# Patient Record
Sex: Male | Born: 1950
Health system: Southern US, Community
[De-identification: ages and names within clinical notes are randomized; demographics above are authoritative.]

## PROBLEM LIST (undated history)

## (undated) DIAGNOSIS — Z91199 Patient's noncompliance with other medical treatment and regimen due to unspecified reason: Secondary | ICD-10-CM

## (undated) DIAGNOSIS — I4891 Unspecified atrial fibrillation: Secondary | ICD-10-CM

## (undated) DIAGNOSIS — F1010 Alcohol abuse, uncomplicated: Secondary | ICD-10-CM

## (undated) DIAGNOSIS — R569 Unspecified convulsions: Secondary | ICD-10-CM

## (undated) DIAGNOSIS — I428 Other cardiomyopathies: Secondary | ICD-10-CM

## (undated) DIAGNOSIS — I2510 Atherosclerotic heart disease of native coronary artery without angina pectoris: Secondary | ICD-10-CM

## (undated) DIAGNOSIS — F17210 Nicotine dependence, cigarettes, uncomplicated: Secondary | ICD-10-CM

## (undated) DIAGNOSIS — I10 Essential (primary) hypertension: Secondary | ICD-10-CM

## (undated) DIAGNOSIS — Z9119 Patient's noncompliance with other medical treatment and regimen: Secondary | ICD-10-CM

## (undated) DIAGNOSIS — J449 Chronic obstructive pulmonary disease, unspecified: Secondary | ICD-10-CM

## (undated) HISTORY — DX: Atherosclerotic heart disease of native coronary artery without angina pectoris: I25.10

## (undated) HISTORY — DX: Patient's noncompliance with other medical treatment and regimen: Z91.19

## (undated) HISTORY — DX: Patient's noncompliance with other medical treatment and regimen due to unspecified reason: Z91.199

## (undated) HISTORY — DX: Unspecified convulsions: R56.9

## (undated) HISTORY — PX: OTHER SURGICAL HISTORY: SHX169

## (undated) HISTORY — PX: NO PAST SURGERIES: SHX2092

## (undated) HISTORY — DX: Essential (primary) hypertension: I10

## (undated) HISTORY — DX: Chronic obstructive pulmonary disease, unspecified: J44.9

## (undated) HISTORY — DX: Unspecified atrial fibrillation: I48.91

## (undated) HISTORY — DX: Alcohol abuse, uncomplicated: F10.10

## (undated) HISTORY — DX: Other cardiomyopathies: I42.8

---

## 2003-03-18 ENCOUNTER — Emergency Department (HOSPITAL_COMMUNITY): Admission: EM | Admit: 2003-03-18 | Discharge: 2003-03-18 | Payer: Self-pay | Admitting: Emergency Medicine

## 2003-03-20 ENCOUNTER — Emergency Department (HOSPITAL_COMMUNITY): Admission: EM | Admit: 2003-03-20 | Discharge: 2003-03-20 | Payer: Self-pay | Admitting: Emergency Medicine

## 2003-08-30 ENCOUNTER — Emergency Department (HOSPITAL_COMMUNITY): Admission: EM | Admit: 2003-08-30 | Discharge: 2003-08-30 | Payer: Self-pay | Admitting: Emergency Medicine

## 2003-09-02 ENCOUNTER — Emergency Department (HOSPITAL_COMMUNITY): Admission: EM | Admit: 2003-09-02 | Discharge: 2003-09-02 | Payer: Self-pay | Admitting: *Deleted

## 2003-09-13 ENCOUNTER — Ambulatory Visit (HOSPITAL_COMMUNITY): Admission: RE | Admit: 2003-09-13 | Discharge: 2003-09-14 | Payer: Self-pay | Admitting: Orthopedic Surgery

## 2003-12-10 ENCOUNTER — Ambulatory Visit (HOSPITAL_COMMUNITY): Admission: RE | Admit: 2003-12-10 | Discharge: 2003-12-10 | Payer: Self-pay | Admitting: Orthopedic Surgery

## 2003-12-15 ENCOUNTER — Encounter (INDEPENDENT_AMBULATORY_CARE_PROVIDER_SITE_OTHER): Payer: Self-pay | Admitting: Cardiology

## 2003-12-15 ENCOUNTER — Inpatient Hospital Stay (HOSPITAL_COMMUNITY): Admission: RE | Admit: 2003-12-15 | Discharge: 2003-12-23 | Payer: Self-pay | Admitting: Orthopedic Surgery

## 2003-12-18 ENCOUNTER — Other Ambulatory Visit (HOSPITAL_COMMUNITY): Payer: Self-pay

## 2003-12-18 ENCOUNTER — Encounter: Payer: Self-pay | Admitting: Cardiology

## 2003-12-18 ENCOUNTER — Other Ambulatory Visit: Payer: Self-pay

## 2003-12-25 ENCOUNTER — Ambulatory Visit: Payer: Self-pay | Admitting: *Deleted

## 2003-12-25 ENCOUNTER — Ambulatory Visit: Payer: Self-pay | Admitting: Internal Medicine

## 2003-12-29 ENCOUNTER — Emergency Department (HOSPITAL_COMMUNITY): Admission: EM | Admit: 2003-12-29 | Discharge: 2003-12-29 | Payer: Self-pay | Admitting: *Deleted

## 2004-01-03 ENCOUNTER — Ambulatory Visit: Payer: Self-pay | Admitting: Internal Medicine

## 2004-01-10 ENCOUNTER — Emergency Department (HOSPITAL_COMMUNITY): Admission: EM | Admit: 2004-01-10 | Discharge: 2004-01-10 | Payer: Self-pay | Admitting: Emergency Medicine

## 2004-02-11 HISTORY — PX: CARDIAC CATHETERIZATION: SHX172

## 2004-12-11 HISTORY — PX: CARDIAC CATHETERIZATION: SHX172

## 2004-12-12 ENCOUNTER — Inpatient Hospital Stay (HOSPITAL_COMMUNITY): Admission: EM | Admit: 2004-12-12 | Discharge: 2004-12-24 | Payer: Self-pay | Admitting: Emergency Medicine

## 2004-12-13 ENCOUNTER — Ambulatory Visit: Payer: Self-pay | Admitting: Cardiology

## 2004-12-13 ENCOUNTER — Encounter: Payer: Self-pay | Admitting: Cardiology

## 2004-12-15 DIAGNOSIS — I2510 Atherosclerotic heart disease of native coronary artery without angina pectoris: Secondary | ICD-10-CM

## 2004-12-15 HISTORY — DX: Atherosclerotic heart disease of native coronary artery without angina pectoris: I25.10

## 2004-12-19 ENCOUNTER — Ambulatory Visit: Payer: Self-pay | Admitting: Infectious Diseases

## 2005-01-06 ENCOUNTER — Ambulatory Visit: Payer: Self-pay | Admitting: Internal Medicine

## 2005-01-07 ENCOUNTER — Ambulatory Visit: Payer: Self-pay | Admitting: Internal Medicine

## 2005-01-09 ENCOUNTER — Ambulatory Visit: Payer: Self-pay | Admitting: Internal Medicine

## 2005-01-20 ENCOUNTER — Ambulatory Visit: Payer: Self-pay | Admitting: Internal Medicine

## 2005-02-06 ENCOUNTER — Ambulatory Visit: Payer: Self-pay | Admitting: Internal Medicine

## 2005-02-17 ENCOUNTER — Emergency Department (HOSPITAL_COMMUNITY): Admission: EM | Admit: 2005-02-17 | Discharge: 2005-02-18 | Payer: Self-pay | Admitting: Emergency Medicine

## 2005-02-28 ENCOUNTER — Ambulatory Visit: Payer: Self-pay | Admitting: Internal Medicine

## 2005-03-24 ENCOUNTER — Inpatient Hospital Stay (HOSPITAL_COMMUNITY): Admission: EM | Admit: 2005-03-24 | Discharge: 2005-03-26 | Payer: Self-pay | Admitting: Emergency Medicine

## 2005-03-24 ENCOUNTER — Ambulatory Visit: Payer: Self-pay | Admitting: Internal Medicine

## 2005-03-25 ENCOUNTER — Encounter (INDEPENDENT_AMBULATORY_CARE_PROVIDER_SITE_OTHER): Payer: Self-pay | Admitting: Cardiology

## 2005-05-23 ENCOUNTER — Emergency Department (HOSPITAL_COMMUNITY): Admission: EM | Admit: 2005-05-23 | Discharge: 2005-05-23 | Payer: Self-pay | Admitting: Emergency Medicine

## 2005-06-09 ENCOUNTER — Ambulatory Visit: Payer: Self-pay | Admitting: Internal Medicine

## 2005-10-29 ENCOUNTER — Emergency Department (HOSPITAL_COMMUNITY): Admission: EM | Admit: 2005-10-29 | Discharge: 2005-10-29 | Payer: Self-pay | Admitting: Emergency Medicine

## 2005-12-02 ENCOUNTER — Emergency Department (HOSPITAL_COMMUNITY): Admission: EM | Admit: 2005-12-02 | Discharge: 2005-12-02 | Payer: Self-pay | Admitting: Family Medicine

## 2006-09-14 ENCOUNTER — Ambulatory Visit: Payer: Self-pay | Admitting: Hospitalist

## 2006-09-14 ENCOUNTER — Observation Stay (HOSPITAL_COMMUNITY): Admission: EM | Admit: 2006-09-14 | Discharge: 2006-09-15 | Payer: Self-pay | Admitting: Emergency Medicine

## 2006-09-15 ENCOUNTER — Encounter (INDEPENDENT_AMBULATORY_CARE_PROVIDER_SITE_OTHER): Payer: Self-pay | Admitting: Hospitalist

## 2007-01-04 ENCOUNTER — Ambulatory Visit: Payer: Self-pay | Admitting: Internal Medicine

## 2007-01-04 ENCOUNTER — Ambulatory Visit (HOSPITAL_COMMUNITY): Admission: RE | Admit: 2007-01-04 | Discharge: 2007-01-04 | Payer: Self-pay | Admitting: Family Medicine

## 2008-05-18 ENCOUNTER — Emergency Department (HOSPITAL_COMMUNITY): Admission: EM | Admit: 2008-05-18 | Discharge: 2008-05-18 | Payer: Self-pay | Admitting: Family Medicine

## 2008-06-03 ENCOUNTER — Emergency Department (HOSPITAL_COMMUNITY): Admission: EM | Admit: 2008-06-03 | Discharge: 2008-06-03 | Payer: Self-pay | Admitting: Emergency Medicine

## 2009-05-09 ENCOUNTER — Emergency Department (HOSPITAL_COMMUNITY): Admission: EM | Admit: 2009-05-09 | Discharge: 2009-05-09 | Payer: Self-pay | Admitting: Emergency Medicine

## 2009-05-18 ENCOUNTER — Emergency Department (HOSPITAL_COMMUNITY): Admission: EM | Admit: 2009-05-18 | Discharge: 2009-05-18 | Payer: Self-pay | Admitting: Emergency Medicine

## 2009-06-12 ENCOUNTER — Ambulatory Visit: Payer: Self-pay | Admitting: Internal Medicine

## 2009-06-12 ENCOUNTER — Encounter: Payer: Self-pay | Admitting: Internal Medicine

## 2009-07-03 ENCOUNTER — Encounter: Payer: Self-pay | Admitting: Internal Medicine

## 2009-07-16 DIAGNOSIS — J449 Chronic obstructive pulmonary disease, unspecified: Secondary | ICD-10-CM | POA: Insufficient documentation

## 2009-07-16 DIAGNOSIS — I428 Other cardiomyopathies: Secondary | ICD-10-CM

## 2009-07-16 DIAGNOSIS — I429 Cardiomyopathy, unspecified: Secondary | ICD-10-CM | POA: Insufficient documentation

## 2009-07-16 DIAGNOSIS — I480 Paroxysmal atrial fibrillation: Secondary | ICD-10-CM | POA: Insufficient documentation

## 2009-07-16 HISTORY — DX: Cardiomyopathy, unspecified: I42.9

## 2009-07-16 HISTORY — DX: Other cardiomyopathies: I42.8

## 2009-08-02 ENCOUNTER — Encounter (INDEPENDENT_AMBULATORY_CARE_PROVIDER_SITE_OTHER): Payer: Self-pay | Admitting: *Deleted

## 2010-03-12 NOTE — Consult Note (Signed)
Summary: [facility]  [facility]   Imported By: Marylou Mccoy 08/08/2009 16:51:52  _____________________________________________________________________  External Attachment:    Type:   Image     Comment:   External Document

## 2010-03-12 NOTE — Letter (Signed)
[facility]  [facility]   Imported By: Marylou Mccoy 08/08/2009 15:17:15  _____________________________________________________________________  External Attachment:    Type:   Image     Comment:   External Document

## 2010-03-12 NOTE — Letter (Signed)
Summary: Appointment - Missed  [facility], Main Office  [address]   [place], [place] [address]   Phone: [phone]  Fax: [phone]     August 02, 2009 MRN: [id number]   DAELAN GATT [address] [place], [place]  [address]   Dear Mr. Loh,  Our records indicate you missed your appointment on 07/16/09 with Dr Johney Frame. It is very important that we reach you to reschedule this appointment. We look forward to participating in your health care needs. Please contact us at the number listed above at your earliest convenience to reschedule this appointment.     Sincerely,   Ruel Favors Scheduling Team

## 2010-05-09 ENCOUNTER — Emergency Department (HOSPITAL_COMMUNITY)
Admission: EM | Admit: 2010-05-09 | Discharge: 2010-05-09 | Disposition: A | Discharge status: Home / Self Care | Payer: Medicaid Other | Attending: Emergency Medicine | Admitting: Emergency Medicine

## 2010-05-09 ENCOUNTER — Emergency Department (HOSPITAL_COMMUNITY): Payer: Medicaid Other

## 2010-05-09 DIAGNOSIS — M79609 Pain in unspecified limb: Secondary | ICD-10-CM | POA: Insufficient documentation

## 2010-05-09 DIAGNOSIS — Y9355 Activity, bike riding: Secondary | ICD-10-CM | POA: Insufficient documentation

## 2010-05-09 DIAGNOSIS — Y929 Unspecified place or not applicable: Secondary | ICD-10-CM | POA: Insufficient documentation

## 2010-05-09 DIAGNOSIS — F17200 Nicotine dependence, unspecified, uncomplicated: Secondary | ICD-10-CM | POA: Insufficient documentation

## 2010-05-09 DIAGNOSIS — IMO0002 Reserved for concepts with insufficient information to code with codable children: Secondary | ICD-10-CM | POA: Insufficient documentation

## 2010-05-09 DIAGNOSIS — I252 Old myocardial infarction: Secondary | ICD-10-CM | POA: Insufficient documentation

## 2010-05-09 DIAGNOSIS — I2510 Atherosclerotic heart disease of native coronary artery without angina pectoris: Secondary | ICD-10-CM | POA: Insufficient documentation

## 2010-05-09 DIAGNOSIS — L02519 Cutaneous abscess of unspecified hand: Secondary | ICD-10-CM | POA: Insufficient documentation

## 2010-05-09 DIAGNOSIS — Z8673 Personal history of transient ischemic attack (TIA), and cerebral infarction without residual deficits: Secondary | ICD-10-CM | POA: Insufficient documentation

## 2010-05-09 DIAGNOSIS — I4891 Unspecified atrial fibrillation: Secondary | ICD-10-CM | POA: Insufficient documentation

## 2010-05-09 DIAGNOSIS — M25539 Pain in unspecified wrist: Secondary | ICD-10-CM | POA: Insufficient documentation

## 2010-05-22 LAB — ETHANOL: Alcohol, Ethyl (B): 178 mg/dL — ABNORMAL HIGH (ref 0–10)

## 2010-06-25 NOTE — Consult Note (Signed)
NAME:  Johnny Rodriguez, Johnny Rodriguez NO.:  [id number]   MEDICAL RECORD NO.:  [id number]          PATIENT TYPE:  INP   LOCATION:  3707                         FACILITY:  [facility]   PHYSICIAN:  Georga Hacking, M.D.DATE OF BIRTH:  [birth year]   DATE OF CONSULTATION:  09/15/2006  DATE OF DISCHARGE:  09/15/2006                                 CONSULTATION   HISTORY OF PRESENT ILLNESS:  This 60 year old male has a previous  history of atrial fibrillation and cardiomyopathy.  He developed atrial  fibrillation following shoulder surgery in 2005 and underwent PE  cardioversion by Dr. Sharyn Lull at the time.  He was admitted about a year  later with rapid atrial fibrillation and had a severe cardiomyopathy  with an ejection fraction of about 10-15%.  He underwent cardiac  catheterization at that time that showed nonobstructive disease in the  left coronary system and possible lesion in the right coronary artery.  He was eventually treated with amiodarone and a TEE was performed  showing a mobile left atrial thrombus.  During that admission, he had  some renal insufficiency and baseline creatinine improved.  He was later  admitted with chest pain in 2007 at which point in time he had some  syncope that was thought due to dehydration.  His left ventricular  ejection fraction had improved significantly with an EF of about 50%,  thinking that his previous history was nonischemic.  He was followed in  the office by me and was noted to be noncompliant with some of his  medical regimen.  He was last seen in December at which time he had  discontinued taking all of his medications and continuing to smoke  cigarettes.  He had not been on Coumadin or taken any medications for  some time, and was in sinus rhythm at the time.  It was recommended to  restart amiodarone, continue ACE inhibitor and spironolactone.  He was  previously off of beta blockers because of bradycardia.  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  He has been started on  diltiazem and has been started on Coumadin and I was asked to assess him  today.  An echocardiogram today shows preserved left ventricular  function with an ejection fraction of 55%, mild left ventricular  hypertrophy.   PHYSICAL EXAMINATION:  GENERAL:  He is an elderly tanned male who is  thin and in no acute distress.  VITAL SIGNS:  Blood pressure 105/72, pulse 72.  SKIN:  Warm and dry.  ENT:  Unremarkable.  LUNGS:  Clear to A&P.  CARDIAC:  Normal S1, S2.  No S3, S4 or murmur.  ABDOMEN:  Soft and nontender.  Femoral distal pulses are present and  were 2+.   STUDIES:  Drug screen was positive for marijuana.  Cardiac enzymes  showed negative troponin with trace elevation of MB. EKG shows atrial  fibrillation with poor R wave  progression in the anterior leads.  Lab  data showed normal chemistry panel and CBC.   IMPRESSION:  1. Recurrent atrial fibrillation in a patient with paroxysmal atrial      fibrillation.  2. History of nonischemic cardiomyopathy with interval improvement in      left ventricular ejection fraction.  3. Trace elevation of MB due to atrial fibrillation with negative      troponin.  4. Ongoing tobacco and history of alcohol abuse.   RECOMMENDATIONS:  I would reinstitute Amiodarone in the patient and try  to load him with this.  He has a previous history of a mobile left  atrial thrombus, and I think he spontaneously converted to sinus rhythm  previously.  At the present time, due to his unreliability, I am not  sure what a good candidate he is for Coumadin, and now that his ejection  fraction is normal, he does not seem to have the risks for embolization  that he once had when his ejection fraction was low and the atrial  thrombus was present.  I  think, because of his recurrence of atrial  fibrillation, he should continue to take a low-dose Amiodarone for  suppression and possible conversion.  I would be glad to follow him back  up in the office.  He should also reinitiate therapy for hypertension.  I would avoid beta blockers in him.  He previously was tried on Betapace  and this caused severe bradycardia.   Thank you for asking me to see him with you.      Georga Hacking, M.D.  Electronically Signed     WST/MEDQ  D:  09/15/2006  T:  09/15/2006  Job:  [id number]

## 2010-06-25 NOTE — Discharge Summary (Signed)
NAME:  Johnny Rodriguez, Johnny Rodriguez NO.:  [id number]   MEDICAL RECORD NO.:  [id number]          PATIENT TYPE:  INP   LOCATION:  3707                         FACILITY:  [facility]   PHYSICIAN:  Eliseo Gum, M.D.   DATE OF BIRTH:  02-26-1950   DATE OF ADMISSION:  09/14/2006  DATE OF DISCHARGE:  09/15/2006                               DISCHARGE SUMMARY   DISCHARGE DIAGNOSES:  1. Stable angina.  2. Atrial fibrillation.  3. Nonischemic cardiomyopathy.  4. COPD.  5. Tobacco abuse.  6. Hyperlipidemia.   MEDICATIONS ON DISCHARGE:  1. Aspirin 325 mg per oral daily.  2. Amiodarone 400 mg per oral 3 times per day.  3. Albuterol inhalation two puffs as required up to 4 times per day.   DISPOSITION AND FOLLOW-UP:  1. The patient is to follow with [facility] on August 8 at      4:00 p.m. to arrange followup with his PCP.  At that time he needs      to be started on a statin for his hyperlipidemia and the PCP needs      to coordinate with Dr. Donnie Aho regarding work-up for his likely      angina.  The PCP also needs to arrange for a spirometry test for      likely COPD.  2. A follow-up appointment with Dr. Donnie Aho has been arranged for      September 23, 2006, Wednesday, at 3:15 p.m. at the [facility].   PROCEDURES PERFORMED:  The patient had an echocardiography done on  September 15, 2006 which showed an ejection fraction of 50-55%, no wall  motion abnormalities.  Left ventricular wall thickness was mildly to  moderately increased.   CONSULTATIONS:  A cardiology consultation was done with Dr. Donnie Aho.  Dr.  Donnie Aho did not advise a stress test for now and advised to start him on  amiodarone and aspirin.  Dr. Donnie Aho is going to follow him up on an out-  patient basis.   HISTORY OF PRESENT ILLNESS:  The patient is a 60 year old white male  with a past medical history significant for non-ischemic cardiomyopathy  and atrial fibrillation who presented to the  ER with central chest pain  that was 10/10.  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  The pain was stabbing in nature, it lasted about 30 minutes, it was  associated with shortness of breath and sweating, and subsided on its  own when he took rest.  He has had similar pains in the past which are  usually associated with exertion.  He denied any swelling of the legs,  recent travel or immobility.  He denied any hemoptysis, hematemesis,  melena or pain in the abdomen.  He did mention of a long history of  cough with occasional orangish colored sputum. He denied any weight  loss, hoarseness of voice or dysphagia.   PAST MEDICAL HISTORY:  1. His past medical history is significant for non-ischemic      cardiomyopathy with an ejection fraction of 10-15% (by  catheterization in November 2006) with no significant coronary      artery disease.  An echocardiography done in February 2007 mentions      a left ventricular dilatation and left ventricular systolic      function low normal with an ejection fraction of 50%, and no motion      abnormalities.  2. He is also known to have atrial fibrillation and is status post      synchronized cardioversion.  3. There is also a history of acute congestive heart failure secondary      to recurrent atrial fibrillation in the past.   PHYSICAL EXAMINATION:  VITAL SIGNS:  Temperature 98.1, blood pressure  100/72, pulse 100 per minute, irregularly irregular, respiratory rate of  18, oxygen saturation of 97% on room air.  GENERAL APPEARANCE:  On general examination, the patient was not in  distress and he was lying comfortably on the bed.  HEAD AND NECK:  Eyes, ENT and neck examinations were unremarkable.  PULMONARY:  He had bilateral good air entry in the chest, with normal  breath sounds and no added sounds.  CARDIOVASCULAR:  His cardiovascular system examination revealed a pulse  rate of 100 per minute which was irregularly irregular.   All the  peripheral pulses were palpable.  Both the first and second heart sounds  were audible.  No murmur, rub or gallop rhythm was noticed.  He did not  have any edema in his extremities.  NEUROLOGIC:  Neurological examination was essentially normal, with alert  and oriented x3.  Cranial nerves II-XII were intact.  No motor or  sensory deficits.  ABDOMEN:  Abdominal examination was normal with a soft, nontender  abdomen, with no palpable lump or organomegaly, and normal bowel sounds.   LABORATORY INVESTIGATIONS:  He had a sodium of 136, potassium 3.8,  chloride 106, bicarbonate 23, BUN of 28, creatinine of 1.15, blood  glucose 97.  His INR was 1.0, and a PTT of 28.  Serial cardiac enzymes  revealed a borderline high CK-MB at 7.1, 3.7 and 4.8, with normal  troponin I.  He had a myoglobin of 82.1.  His H&H were 15.3 and 44.8,  respectively.  He had a white cell count of 7.1, and platelets of  274,000.  His MCV was 93.8.  Urinalysis revealed a specific gravity of  2.021, ketones 15, negative for nitrites and leukocytes.  His TSH was  normal.  His LDL was elevated at 123, HDL 50, and VLDL of 27, and a  cholesterol of 200.  Urine drug screen was positive for marijuana.  Alcohol was less than 5.  His bilirubin was 1, alkaline phosphatase 65,  SGOT of 21, SGPT of 11, protein 6.9, albumin 3.9, and calcium 9.2.   HOSPITAL COURSE:  1. Chest pain:  He was admitted to a telemetry floor and kept on      strict observation. Following admission into the hospital, he did      not have any further chest pain episodes, and the description of      chest pain on exertion and relieved on rest was most consistent      with stable angina.  We did get a cardiology consult with Dr.      Donnie Aho who did not think a stress test was needed at this time.  We      started him on aspirin and Dr. Donnie Aho is going to follow him up as      an outpatient  as regards to the further work-up of his chest pain.  2. Atrial  fibrillation:  The patient was put on telemetry which showed      a ventricular rate of around 100 beats per minute.  The patient      remained hemodynamically stable and heart rate also was more or      less stable at 100 per minute.  We initially started him on      coumadin but, following Dr. York Spaniel evaluation and recommendation,      we switched him to amiodarone.  3. Nonischemic cardiomyopathy:  We repeated an echocardiogram which      showed an ejection fraction of 50-55% with no wall motion      abnormalities.  The patient is not in failure and he did not      complain of any shortness of breath or PND or orthopnea.  The PCP      needs to follow up on this on an out-patient basis.  4. Cough:  The patient has a significant history of smoking and our      evaluation is that the cough was most likely secondary to chronic      obstructive pulmonary disease.  We have started him on albuterol,      and outpatient spirometry is to be arranged by the PCP.  5. Tobacco abuse:  Counseling has been done in regard to tobacco      abuse.  The PCP needs to discuss this with the patient.  6. Disposition:  The patient desired to be followed up at the      [facility] clinic.  An appointment with the [facility] clinic has      been arranged.  He is going to be evaluated on an outpatient basis      by Dr. Donnie Aho for further work-up of his chest pain.   CONDITION ON THE DAY OF DISCHARGE:  Temperature of 97.6, respiratory  rate of 20, saturations 95% on room air, pulse of 72 per minute,  irregular, and blood pressure 105/72. The patient was not complaining of  any chest pain or shortness of breath.   Repeat BMET examinations showed a sodium of 135, potassium 3.6, chloride  106, bicarbonate 22, BUN 27, creatinine 1.48, and glucose of 104.      Zara Council, MD  Electronically Signed     ______________________________  Eliseo Gum, M.D.    AS/MEDQ  D:  09/15/2006  T:  09/16/2006   Job:  [id number]   cc:   Eliseo Gum, M.D.  Georga Hacking, M.D.  [facility]

## 2010-06-25 NOTE — Discharge Summary (Signed)
NAME:  DIVON, KRABILL                 ACCOUNT NO.:  [id number]   MEDICAL RECORD NO.:  [id number]          PATIENT TYPE:  INP   LOCATION:  3707                         FACILITY:  [facility]   PHYSICIAN:  Zara Council, MD      DATE OF BIRTH:  1950/08/26   DATE OF ADMISSION:  09/14/2006  DATE OF DISCHARGE:  09/15/2006                               DISCHARGE SUMMARY   Audio too short to transcribe (less than 5 seconds)      Zara Council, MD     AS/MEDQ  D:  09/15/2006  T:  09/15/2006  Job:  [id number]

## 2010-06-28 NOTE — Discharge Summary (Signed)
NAME:  Johnny Rodriguez, Johnny Rodriguez NO.:  [id number]   MEDICAL RECORD NO.:  [id number]          PATIENT TYPE:  INP   LOCATION:  4707                         FACILITY:  [facility]   PHYSICIAN:  Fransisco Hertz, M.D.  DATE OF BIRTH:  Mar 05, 1950   DATE OF ADMISSION:  12/12/2004  DATE OF DISCHARGE:  12/24/2004                                 DISCHARGE SUMMARY   DISCHARGE DIAGNOSES:  1.  Acute congestive heart failure complicated with recurrent atrial      fibrillation.  2.  Severe coronary artery disease.  3.  Acute renal failure.   DISCHARGE MEDICATIONS:  1.  Aspirin 81 mg p.o. daily.  2.  Digoxin 0.125 mg p.o. daily.  3.  Coumadin 7.5 mg p.o. daily to be adjusted by Dr. Michaell Cowing in the Coumadin      clinic.  4.  Amiodarone 200 mg p.o. daily.  5.  Spironolactone 25 mg p.o. daily.  6.  Lisinopril 10 mg p.o. daily.  7.  Lasix 40 mg p.o. daily.  8.  Coreg 6.25 mg p.o. daily.  The patient received about a month and a half      supply of samples from Dr. York Spaniel office and later on he will be      accepted into the [employer] patient assistance program to      receive the carbetalol for free.   CONSULTATIONS:  Dr. Viann Fish, cardiologist.\.   PROCEDURES:  1.  On December 12, 2004 the patient had a chest x-ray consistent with CHF.  2.  On November 3, the patient had a 2-D echo consistent with a moderately      dilated left ventricle.  Left ventricular ejection fraction estimated in      the range of 10-20%.  Severe diffuse left ventricular hypokinesis.  Left      ventricular wall thickness mildly increased.  Mild MR and dilated right      atrium.  3.  On December 13, 2004, another chest x-ray showed improvement of the      congestive heart failure.  4.  On December 18, 2004, another chest x-ray was consistent with a previous      left posterior rib fracture, but no acute chest disease.  5.  On December 20, 2004, another chest x-ray which again showed  no active  cardiopulmonary abnormalities.  6.  On December 24, 2004, the patient also had venous Dopplers of bilateral      lower extremities that showed no evidence of DVT, superficial      thrombosis, or Baker's cyst bilaterally, as well as arterial Dopplers      that showed no evidence of significant plaque noted throughout and no      evidence of stenosis.   HISTORY OF PRESENT ILLNESS:  For full details please refer to the formal H&P  inserted in the patient's chart.  But in brief, [name] is a 60 year old  Caucasian male with past medical history of atrial fibrillation and tobacco  and alcohol abuse, who presented complaining of a 1-week history of  substernal chest pain, intensity  10/10, constant, with no radiation, that  began the morning of admission.  He also complained of new onset shortness  of breath that had no relationship whatsoever to the chest pain, also  occurring at rest.  He has also had nausea and vomiting x1 episode the  morning of admission, a large amount of yellowish fluid.  He denied any  palpitations, diaphoresis, headaches, dizziness, abdominal pain, fever,  chills, diarrhea, or lower extremity edema.  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  The patient was on no home medications.   SUBSTANCE HISTORY:  He is a current tobacco smoker, about two packs a day,  also alcohol drinker.  He drinks about a pack of beer per day.   SOCIAL HISTORY:  He is single, has no health insurance.  The patient lives  at a motel.   PHYSICAL EXAMINATION:  VITAL SIGNS:  Upon admission, temperature 97.2, blood  pressure 127/93, heart rate 106, respirations 32, O2 saturations 98% on room  air.  GENERAL:  The patient was alert, awake, oriented x3, complaining of some  muscle cramps  in his right calf.  LUNGS:  Bilateral basilar crackles.  CARDIOVASCULAR:  Had an irregularly irregular rhythm, tachycardic, with no  murmurs, rubs, or gallops.  ABDOMEN:  Soft, nontender, nondistended, with positive bowel sounds.  EXTREMITIES:  He had no lower extremity edema and good distal pulses  bilaterally.   LABORATORY DATA:  Upon admission sodium 140, potassium 4.1, chloride 107,  bicarbonate 21, BUN 17, creatinine 1.2, glucose 108.  Bilirubin 1.6,  alkaline phosphatase 176.  AST 92, ALT 135.  Protein 5.8, albumin 2.4,  calcium 8.5.  White count 7.6, hemoglobin 14.3 with hematocrit of 43.2,  platelets of 242,000.  D-dimer was 1.19.  Three sets of cardiac enzymes with  negative troponins and only slightly elevated CK-MB with an insignificant  relative index.   HOSPITAL COURSE BY ACTIVE PROBLEM:  1.  CONGESTIVE HEART FAILURE.  Upon admission the patient had bibasilar      crackles and it was later found out on 2-D echo to have a very limited      ejection fraction of only 10-15%, so it was decided to diurese him with      Lasix.  The patient responded well and during his hospital admission his      shortness of breath improved as well as did his serial chest x-ray which      showed improvement of the pulmonary edema.  At time of discharge the      patient is not complaining of any shortness of breath whatsoever.   1.  RECURRENT ATRIAL FIBRILLATION.  Upon admission the patient was found to      be in atrial fibrillation.  According to past discharge summaries dating      exactly one year ago, the patient was also found to be in atrial      fibrillation, was seen by Dr. Sharyn Lull, cardiologist, and at that time a      TEE was done and he was cardioverted and he was discharged in normal      sinus rhythm.  However, there were some compliance issues and he did not      continue on his medications.  This time the patient  was admitted in     atrial fibrillation which might have  exacerbated his CHF with his very      poor ejection fraction, so it was decided that rate control was very      important to allow for significant ventricular dilatation and feeling in      order to be able to maintain some degree of systolic function.      Cardiology was consulted, specifically, Dr. Viann Fish, who will be      assuming cardiologic care of the patient in the future.  A TEE was      performed to evaluate if the patient qualified for a cardioversion;      however, a very large, mobile, left atrial thrombus was found, which      contraindicated cardioversion at this time, so the patient was put on      Lovenox and Warfarin was started.  The patient was kept for a couple of      days until his INR became therapeutic and the patient is discharged with      an INR of 3 on 7.5 mg daily of Coumadin and he is to follow with Dr.      Michaell Cowing in the [facility] outpatient Coumadin clinic to follow up with his      INRs and his daily dose of Coumadin.  On December 23, 2004 the patient      had a significant pain in his right calf that was worse upon walking, as      well as decreased dorsalis pedis pulses in the right foot.  At this time      we thought that it might be  possible that the patient might have thrown      a clot from his atrial fibrillation.  Despite him being therapeutic on      Coumadin with an INR at that time of 2.7; however, we are very concerned      and CVTS was consulted, specifically Dr. Hart Rochester; however, when he      evaluated the patient, he considered that an embolectomy was not needed      and he describes that in about 20% of patients, dorsalis pedis pulses      are absent.  At this time arterial and venous Dopplers were ordered and      as I have explained the results above, there were no issues with either.   1.  ACUTE RENAL FAILURE.  This resolved nicely with hydration, probably was      secondary to his poor systolic ejection fraction.  On the day of       discharge the patient's creatinine is 1.1 which is approximately his      baseline from previous studies.   PHYSICAL EXAMINATION:  DISCHARGE VITAL SIGNS:  Blood pressure 103/67, heart  rate 70, respirations 21, temperature 97.4, 97% on room air.   LABORATORY DATA:  Discharge labs:  Sodium 138, potassium 5.1, chloride 99,  CO2 32, glucose 99, BUN 16, creatinine 1.1, calcium 9.  CBC:  6, hemoglobin  15.4, hematocrit 44.9 with an MCV of 95.9, and platelets of 264.  PT of 31.3  with an INR of 3.   DISPOSITION/FOLLOW UP:  The patient is to follow up with Dr. Michaell Cowing in the  Coumadin clinic in about three days' time.  He will also follow up with me,  Dr. Ardyth Harps, in the [facility] clinic, to monitor his chronic  issues  as well as he will follow up with his cardiologist, Dr. Viann Fish, in about two weeks' time.      Peggye Pitt, M.D.    ______________________________  Fransisco Hertz, M.D.   EH/MEDQ  D:  12/24/2004  T:  12/25/2004  Job:  [id number]   cc:   Georga Hacking, M.D.  Fax: [phone]  Email: [email or web address]   Coralie Carpen, M.D.  Fax: [phone]   Peggye Pitt, M.D.  Fax: [phone]

## 2010-06-28 NOTE — Consult Note (Signed)
NAME:  Johnny Rodriguez, Johnny Rodriguez NO.:  [id number]   MEDICAL RECORD NO.:  [id number]          PATIENT TYPE:  INP   LOCATION:  4707                         FACILITY:  [facility]   PHYSICIAN:  Georga Hacking, M.D.DATE OF BIRTH:  Oct 21, 1950   DATE OF CONSULTATION:  12/13/2004  DATE OF DISCHARGE:                                   CONSULTATION   This 60 year old male is seen at the request of the teaching service for  evaluation of rapid atrial fibrillation and congestive heart failure.  The  patient is a difficult historian.  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  He  has a history of some sort of a nervous breakdown several years ago and at  one time was seen by mental health but receives no regular medical care.  He  states he smokes around 2 packs of cigarettes per day and drinks around a 6-  pack of beer daily but wants to cut down.  He was admitted with atrial  fibrillation and taken care of by Dr. Sharyn Lull about a year ago.  He had a  previous clavicular fracture and went into atrial fibrillation during the  induction of anesthesia.  He was hospitalized, and enzymes were negative.  He was started on digoxin and later amiodarone.  He underwent TEE  cardioversion by Dr. Algie Coffer at that time and was converted to normal sinus  rhythm.  He evidently was discharged on Coumadin and amiodarone and was told  to follow up with [facility].  He quit taking all of his medications and  has had no medical followup for some time.   He reports having had worsening shortness of breath and fatigue to the point  where he had difficulty doing any of his activities.  He was attempting to  ride his bicycle home and became severely weak and short of breath to the  point he had to lie down on the ground.  He became severely short of breath  at work and was brought to the emergency room where he was found to be in  rapid  atrial fibrillation.  He had a new interval infarction on EKG and  complained of some vague chest tightness.  He had mild elevation of CPK-MB  and had rapid heart rate.  He has been given a dose of Lasix and was started  on ACE inhibitors.  An echocardiogram today has shown an ejection fraction  of 10 to 20% with severe global hypokinesis.  The valves were relatively  normal.   PAST MEDICAL HISTORY:  Negative for hypertension or diabetes.  No history of  rheumatic heart disease.   PAST SURGICAL HISTORY:  1.  Left eye surgery.  2.  Previous shoulder surgery.   ALLERGIES:  None.   MEDICATIONS:  None.   FAMILY HISTORY:  Father died at age 78 of complications of emphysema.  Mother died of MI at age 35.  Brother and sister are in good health.   SOCIAL HISTORY:  Never married and has no  children.  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  He drinks around a 6-pack of beer  per day and has had hard liquor daily in the past also.  He has smoked  heavily for many years.   REVIEW OF SYSTEMS:  His weight has been stable.  He has no skin problems.  No eye, ear, nose, or throat problems. No difficulty with GI bleeding or  gastrointestinal problems.  He does not have any urinary symptoms or  problems.  He had a previous clavicular fracture but no significant  arthritis.  He does relate possible history of a stroke several years ago  but is vague on the details.   PHYSICAL EXAMINATION:  GENERAL:  He is a thin, middle-aged male who is  polite, pleasant, and courteous and in no acute distress.  VITAL SIGNS:  Pulse 138 and irregular, respirations 12.  His skin is warm  and dry.  Blood pressure 110/70.  HEENT:  EOMI. PERRLA.  Conjunctivae and sclerae clear.  Fundi not examined.  Pharynx negative.  NECK:  supple without masses.  No JVD, thyromegaly, or bruits.  LUNGS:  Clear.  CARDIOVASCULAR:  Rapid irregular rhythm.  There was a soft 1/6 systolic  murmur. S3 is noted.   ABDOMEN:  Soft, nontender. Trace edema is noted.  Distal pulses were 2+.   EKG shows rapid atrial fibrillation. There also appears to be in interval  anteroseptal infarction since previous EKG of 1 year ago.   Chest x-ray shows some congestive heart failure, mild cardiomegaly.   Echocardiogram shows EF of 10 to 20% which represents a definite reduction  since an EKG of 2005.   Laboratory work shows elevation of cardiac enzymes as well as SGOT and SGPT.  There is mild elevation of troponin.   IMPRESSION:  1.  Congestive heart failure, severe reduction in left ventricular ejection      fraction of 10 to 20%.  2.  Probable anterior wall anteroseptal infarction.  3.  Rapid atrial fibrillation.  4.  Cigarette abuse.  5.  Alcohol abuse.  6.  Atrial fibrillation with history of cardioversion by transesophageal      echocardiogram one year ago.   RECOMMENDATIONS:  The patient has decompensated congestive heart failure as  well as rapid atrial fibrillation.  He appears to have had an interval  anterior myocardial infarction. The differential possibility would be  ischemic with previous myocardial infarction, tachycardia-induced  cardiomyopathy from uncontrolled atrial fibrillation, alcoholic  cardiomyopathy or idiopathic cardiomyopathy.   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  He should be anticoagulated  and also at some point, once he has been optimized medically, may need to be  considered for cardiac catheterization. He obviously needs to stop smoking,  a total cessation of alcohol, and will need intensive social service  management and financial management.   Thank you for asking me to see him with you.     Georga Hacking, M.D.  Electronically Signed   WST/MEDQ  D:  12/13/2004  T:  12/13/2004  Job:  [id number]   cc:    Internal Medicine Teaching Service

## 2010-06-28 NOTE — Cardiovascular Report (Signed)
NAME:  Johnny Rodriguez, Johnny Rodriguez NO.:  [id number]   MEDICAL RECORD NO.:  [id number]          PATIENT TYPE:  INP   LOCATION:  4707                         FACILITY:  [facility]   PHYSICIAN:  Georga Hacking, M.D.DATE OF BIRTH:  1950/04/12   DATE OF PROCEDURE:  12/16/2004  DATE OF DISCHARGE:                              CARDIAC CATHETERIZATION   HISTORY OF PRESENT ILLNESS:  A 60 year old male presented with rapid atrial  fibrillation and a cardiomyopathy.  Had elevation of troponin as well as  abnormal EKG consistent with an anterior infarct.   PROCEDURE:  The patient tolerated the procedure well without difficulty.  Following the procedure he had good hemostasis and peripheral pulses noted.  The right femoral artery was entered using a single anterior needle wall  stick.   HEMODYNAMIC DATA:  Aorta post contrast 91/70 LV postcontrast 91/12-20.  Angiographic data:  Left ventriculogram:  Performed in the 30 degrees RAO  projection. The aortic valve is normal. The mitral valve is normal.  The  left ventricle is dilated with severe global hypokinesis noted.  The  estimated ejection fraction is about 15%. No filling defects are noted.  Coronary arteries arise and distribute normally.  There is no significant  coronary calcification noted.  Left main coronary is normal.  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  It was free of disease, however.  The left  anterior descending is a somewhat small caliber vessel that supplies the  apex and appears free of disease. An intermediate branch arises and also is  somewhat small but is free of disease.  The circumflex coronary artery has  two marginal branches and is free of disease.  The right coronary artery is  a somewhat small caliber vessel which is dominant.  There may be a segmental  50% mid vessel stenosis versus isolated coronary spasm in the middle portion  of  the vessel.  There is no calcification and no other irregularity in the  vessel otherwise.   IMPRESSION:  1.  Dilated ventricle with severe global hypokinesis consistent with      cardiomyopathy-nonischemic.  2.  No significant coronary artery disease identified.  The right coronary      artery lesion is only moderate and could represent spasm.  There was no      other evidence of atherosclerosis.   RECOMMENDATIONS:  The patient has a severe cardiomyopathy.  It is likely  nonischemic.  It may be due to tachycardia-induced cardiomyopathy, alcohol,  or it could be idiopathic.  It is recommended that the patient have the rate  control as his atrial fibrillation rate was not well-controlled during the  cath.  I would recommend restarting the IV Cardizem and placing him on  Lanoxin.  I would continue to titrate his beta blockers and ACE inhibitors  and add Lanoxin to his regimen.  I would coumadinized him for an INR between  two and three.  He should avoid all alcohol and also would give strong  consideration to a  TE cardioversion during this hospitalization.     Georga Hacking, M.D.  Electronically Signed    WST/MEDQ  D:  12/16/2004  T:  12/16/2004  Job:  [id number]   cc:   Teaching service

## 2010-06-28 NOTE — Discharge Summary (Signed)
NAME:  Johnny Rodriguez, Johnny Rodriguez NO.:  [id number]   MEDICAL RECORD NO.:  [id number]          PATIENT TYPE:  INP   LOCATION:  0349                         FACILITY:  [facility]   PHYSICIAN:  Mohan N. Sharyn Lull, M.D. DATE OF BIRTH:  17-Oct-1950   DATE OF ADMISSION:  12/15/2003  DATE OF DISCHARGE:  12/23/2003                                 DISCHARGE SUMMARY   ADMITTING DIAGNOSES:  1.  New onset atrial fibrillation.  2.  Rule out coronary insufficiency.  3.  Coronary artery disease.  4.  Questionable history of myocardial infarction in the past.  5.  History of tobacco abuse.  6.  Status post left clavicular fracture, status post removal of the pin.   FINAL DIAGNOSES:  1.  Status post atrial fibrillation.  2.  History of coronary artery disease.  3.  History of questionable myocardial infarction in the past.  4.  Tobacco abuse.  5.  History of alcohol abuse.  6.  Status post left clavicular fracture.  7.  Status post removal of hardware/pin from left shoulder.   DISCHARGE HOME MEDICATIONS:  1.  Amiodarone 200 mg 1 tablet daily.  2.  Coumadin 4 mg 1 tablet daily.  3.  Percocet 5/325 1-2 tablets q.6h. for pain as needed.  4.  Robaxin 500 mg q.6h. as needed.  5.  Baby aspirin 81 mg 1 tablet daily.   ACTIVITY:  As tolerated.   DIET:  Low salt, low cholesterol, avoid sweets.   FOLLOWUP:  CBC and BP in one week.  Follow up with me in one week.   CONDITION ON DISCHARGE:  Stable.   BRIEF HISTORY AND HOSPITAL COURSE:  Mr. Bann is 60 year old white male with  past medical history significant for coronary artery disease, history of  inferior wall MI approximately 7 years ago, tobacco abuse, chronic  incontinence, status post left clavicular fracture approximately 3 or 4  months ago.  Came to outpatient surgery for removal of clavicular pin and  during induction of anesthesia was noted to be in atrial fibrillation with  rapid ventricular response.  Patient received esmolol  initially and then IV  Cardizem 20 mg and was started on drip with control of ventricular rate.  Patient denies chest pain, shortness of breath, denies palpitations,  lightheadedness or syncope.  Denies prolonged immobilization.  Denies recent  weight loss.  Denies history of rheumatic heart disease.  Denies recent  alcohol binges.  States used to drink heavy for 7 years, quit 5 years ago.  Denies any cardiac workup since his MI approximately 7 years ago.  Denies  any history of PND, orthopnea, leg swelling.  Complains of left shoulder and  arm pain, increased movement at surgical site.   PAST MEDICAL HISTORY:  As above.   PAST SURGICAL HISTORY:  1.  Left eye surgery approximately 6 years ago.  2.  Had left shoulder surgery on December 15, 2003.   MEDICATIONS:  None.   ALLERGIES:  None.   SOCIAL HISTORY:  Single.  No children.  Smoked 2 to 3 packs per day for 6  years.  Used to drink 6 packs of beer and hard liquor daily for  approximately 7 years; quit 5 years ago.  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   FAMILY HISTORY:  Father died at the age of 58 due to complications of  emphysema.  Mother died of MI at 72.  One brother and sister in good health.   PHYSICAL EXAMINATION:  GENERAL: He was alert, awake, oriented x3, in no  acute distress.  VITAL SIGNS: Blood pressure is 115/85, pulse was 80-85 irregularly irregular  on Cardizem drip.  HEENT: Conjunctivae was pink.  NECK: Supple, no JVD, no bruit.  LUNGS: Decreased breath sounds at bases.  CARDIOVASCULAR EXAM: S1, S2 normal.  There was soft systolic murmur.  There  was no S3 gallop.  ABDOMEN: Soft, bowel sounds were present, nontender.  EXTREMITIES: There was no clubbing, cyanosis or edema.  Surgical site in the  left shoulder area was dry.   LABORATORY:  EKG showed atrial fibrillation with moderate ventricular  response, intraventricular conduction delay, left anterior fascicular block  versus old inferior wall MI.  Other  labs: His digoxin level was 0.2.  Cholesterol was 182, HDL 47, LDL 117, which was slightly elevated.  His  blood sugar was 133, a repeat blood sugar was 105, hemoglobin A1C was 5.6.  Sodium 138, potassium 3.6, chloride 105, bicarbonate 26, BUN 11, creatinine  1.2.  His liver enzymes were normal.  Magnesium was 1.9.  Two sets of  cardiac enzymes and troponin I were negative.  His hemoglobin was 14.1,  hematocrit 41.2, white count was 8.6.  TSH was normal also, it was 1.322.   BRIEF HOSPITAL COURSE:  Patient was admitted to telemetry unit.  MI was  ruled out by serial enzymes and EKG.  Patient was started on IV Cardizem and  Betapace was added.  Patient had significant pauses while on Betapace, which  was discontinued, and was stated on digoxin and then amiodarone was added.  Patient remained in A-Fib.  Patient subsequently underwent TEE cardioversion  by Dr. Algie Coffer and was converted to sinus rhythm.  Patient remained in sinus  rhythm for the last few days.  His INR today is in therapeutic range.  Patient states he did  not have any place to go; social services was contacted and the patient  probably will be going to a halfway house or a shelter tomorrow.  We will  recontact social service also for assistance with medication.  Patient will  be given phone number for [facility] after discharge for his medication  refills.      MNH/MEDQ  D:  12/22/2003  T:  12/23/2003  Job:  [id number]

## 2010-06-28 NOTE — Discharge Summary (Signed)
NAME:  Johnny Rodriguez, Johnny Rodriguez                 ACCOUNT NO.:  [id number]   MEDICAL RECORD NO.:  [id number]          PATIENT TYPE:  INP   LOCATION:  4728                         FACILITY:  [facility]   PHYSICIAN:  C. Ulyess Mort, M.D.DATE OF BIRTH:  10/08/1950   DATE OF ADMISSION:  03/24/2005  DATE OF DISCHARGE:  03/26/2005                                 DISCHARGE SUMMARY   DISCHARGE DIAGNOSES:  1.  Chest pain/syncope/bradycardia.  2.  History of nonischemic cardiomyopathy/congestive heart failure.  3.  History of atrial fibrillation.  4.  Acute renal failure.  5.  Tobacco abuse.   DISCHARGE MEDICATIONS:  1.  Coreg 3.125 mg p.o. twice daily.  2.  Amiodarone 200 mg p.o. daily.  3.  Aspirin 81 mg p.o. daily.  4.  Lisinopril 10 mg daily.  5.  Coumadin 10 mg daily.   We asked the patient to stop taking spironolactone, digoxin and Lasix per  Cardiology request.   CONDITION AT DISCHARGE:  The patient is stable and improved.   FOLLOWUP:  1.  The patient is to follow up with Dr. Donnie Aho in Cardiology on April 11, 2005 at 9:45 a.m.  At that time, we will follow up on the patient's      chest pain, syncope and bradycardia as well as his seemingly resolved      nonischemic cardiomyopathy and congestive heart failure.  2.  The patient will also follow up with Dr. Lucas Mallow in the [facility] on      March 31, 2005 at 3 p.m. to get his warfarin at a therapeutic dose.   PROCEDURES AND STUDIES:  1.  He had a chest x-ray on March 24, 2005 that showed no active      cardiopulmonary disease.  2.  He had a 2-D echocardiogram on March 25, 2005 that showed mild      dilation of the left ventricle with overall lower systolic left      ventricular function with the left ventricular systolic function of 60%      and no left ventricular wall motion abnormalities.  Please note that the      2-D echocardiogram showed marked improvement of the left ventricular      function versus an echocardiogram  done on December 13, 2004 with a left      ventricular ejection fraction at that time of 10%.   CONSULTATIONS:  Dr. Donnie Aho with Cardiology.   ADMITTING HISTORY AND PHYSICAL:  The patient is a 60 year old white male  with a history of a nonischemic cardiomyopathy with ejection fraction of 10%  to 15% by a cardiac catheterization in November '06.  He also has a history  of atrial fibrillation, tobacco and alcohol abuse, and presents with dyspnea  and chest pain and question of syncope that started the evening of  admission.  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  Apparently,  his ride did not pick him up and he walked 12 miles toward home.  He had to  slow his pace several times secondary to onset  of substernal chest pain  which he described to be like a knife.  This sharp pain was said to start  in his back and also was described as like a balloon getting bigger and  bigger.  He had associated dyspnea, nausea and diaphoresis.  He states he  fell to the ground and his knees buckled.  There was a question of whether  or not the patient experienced loss of consciousness, because he does not  recall a few minutes of the episode and he was brought to the ED via EMS.   ALLERGIES:  The patient has no allergies.   PAST MEDICAL HISTORY:  1.  Nonischemic cardiomyopathy with ejection fraction of 10% to 15% by      cardiac catheterization in November '06 with no significant coronary      artery disease.  2.  Atrial fibrillation.  3.  History of acute congestive heart failure secondary to recurrent atrial      fibrillation; the patient is on Coumadin.  4.  History of acute renal failure.  Creatinine at discharge on admission in      November '06 was 1.1.  5.  Tobacco abuse.  6.  Alcohol abuse.  7.  History of clavicular fracture.  8.  History of left eye surgery.  9.  History of shoulder surgery.  10. Chronic lower back pain.   HOME MEDICATIONS:  1.  Aspirin 81 mg daily.  2.   Digoxin 0.125 mg daily.  3.  Coumadin, unknown dose.  4.  Amiodarone 200 mg daily.  5.  Spironolactone 25 mg daily.  6.  Lisinopril 10 mg daily.  7.  Coreg 6.25 mg daily.  8.  Lasix 40 mg daily, but this was discontinued by Dr. Donnie Aho.   SUBSTANCE ABUSE:  The patient is a current smoker, smoking half a pack per  day for the past 2-3 years; he also explains that he has had a previous 2-  pack-per-day history x45 years.  The patient says he quit drinking alcohol  back in November of '06 and denies any cocaine or illicit drug use.   SOCIAL HISTORY:  The patient is single and works part-time with [employer], digging ditches.  He has Medicaid, but also self-pays for some of  his healthcare and he currently lives at a motel and supports himself  financially.   FAMILY HISTORY:  Noncontributory.   REVIEW OF SYSTEMS:  As per HPI.   PHYSICAL EXAMINATION:  VITAL SIGNS:  Temperature 97.7, blood pressure  147/83, pulse 76, respirations 22, O2 SAT 96% on 2 L.  GENERAL:  The patient appeared disheveled, in no apparent distress.  HEENT:  Eyes:  Pupils equally round and reactive to light and accommodation.  Extraocular movements intact.  Sclerae and conjunctivae clear.  ENT:  Oropharynx clear with poor dentition.  NECK:  Supple.  No lymphadenopathy.  No jugular venous distention.  LUNGS:  Respirations clear to auscultation bilaterally, no rales, no  rhonchi.  There were some decreased breath sounds in the bases.  CARDIOVASCULAR:  Regular rate and rhythm.  No murmurs, gallops or rubs.  GI:  Soft, nontender and non-distended abdomen.  Positive bowel sounds.  No  hepatosplenomegaly.  EXTREMITIES:  No clubbing, cyanosis, or edema.  SKIN:  Minor abrasions with hemorrhagic crusts.  LYMPHATICS:  No lymphadenopathy.  MUSCULOSKELETAL:  Full range of motion.  No tenderness to palpation of  joints or muscles.  Strength 5/5 x4. NEUROLOGIC:  Alert and oriented x3.  Cranial  nerves II-XII intact.  No  focal  deficit.  PSYCHIATRIC:  Appropriate.   LABORATORY DATA:  BMET:  Sodium 142, potassium 4.1, chloride 110, bicarb 26,  BUN 23, creatinine 1.5, glucose of 90, bilirubin 1.1, alkaline phosphatase  68, AST 51, ALT 70, protein 7.3, albumin 3.9, calcium 9.2.  On CBC, white  blood count 11.9, hemoglobin 13.3, hematocrit 38.9 and platelets of 338,000  with an ANC of 10.  The patient's PT was 15.3, INR 1.2, PTT 28.  Cardiac  enzymes:  Total CK 305, CK-MB 9.9, troponin 0.01.  Brain natriuretic peptide  30.  Lipase 17.  UDS was positive for opiates.   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   HOSPITAL COURSE:  PROBLEM #1 - CHEST PAIN/SYNCOPE/BRADYCARDIA:  This was  likely secondary to angina and bradycardia.  The patient had a clean  catheterization in November '06.  He had a 2-D echo back in November '06  that showed 10% left ventricular ejection fraction.  While inpatient here,  we got another 2-D echo in February '07 that showed 50% left ventricular  ejection fraction, which was an improvement from his echo back in November  of last year.  He did present with elevated cardiac enzymes on admission of  total CK as well as CK-MB; however, his troponins remained negative.  The  EKG findings during this hospitalization were not dissimilar found in an EKG  done in 2006 and the bradycardia was present throughout admission and  seemingly was due to the patient's home dose of Coreg of 12.5 mg twice  daily.  Coreg was reduced while the patient was in house with remaining  bradycardia; however, the patient no longer described any syncope or  dizziness or lightheadedness.  Eventually the Coreg was held and eventually,  after Cardiology was consulted, it was deemed that the patient could be  discharged on a much lower dose of Coreg of 3.125 mg twice a day.  The  patient's heart rate  slightly improved between 50 and 95 beats per minute at  discharge.   PROBLEM #2 - HISTORY OF NON-ISCHEMIC CARDIOMYOPATHY/CONGESTIVE HEART  FAILURE:  The patient was without clinical signs or symptoms of congestive  heart failure.  No murmurs, gallops or rubs.  The echo during admission was  without wall motion abnormality.  This echo could have possibly shown  resolution of his cardiomyopathy, as the left ventricular ejection fraction  had improved quite possibly due to the patient's alcohol cessation.   PROBLEM #3 - HISTORY OF ATRIAL FIBRILLATION:  Resolved.  The resolution of  the patient's atrial fibrillation was likely secondary to improvement of his  nonischemic cardiomyopathy.  The patient was advised to continue Coumadin,  amiodarone and Coreg.   PROBLEM #4 - ACUTE RENAL FAILURE:  Resolving.  The patient's creatinine rose  to a high of 1.5 during admission; this was likely secondary to volume depletion.  No IV contrast studies were done.  Creatinine decreased on  normal saline IV fluids.  The patient's baseline creatinine is 1.1 and at  discharge, the patient's creatinine was at 1.3.  The patient had positive  urine output.   PROBLEM #5 - TOBACCO ABUSE:  The patient expresses an understanding of a  need to quit smoking.  He received counseling while here as an inpatient,  but he described that despite this, he is not quite yet ready to  stop  smoking.  While inpatient, he was given a nicotine patch 21 mg transdermal  daily.   DISCHARGE LABORATORY DATA AND VITALS:  Sodium 139, potassium 4.2, chloride  105, bicarb 28, BUN 12, creatinine 1.3, glucose 124 and calcium was 8.6.  White count 10.4, hemoglobin of 13.1, hematocrit 37.9, platelets 283,000.  PT was 15.9, INR 1.3.  Cardiac markers:  Total CK 144, CK-MB 2.9, troponin  0.01.   Patient's vitals:  Temperature 98.1, heart rate 95, respirations 20, blood  pressure 100/48 and saturating 100% on room air.   PENDING LABORATORY  DATA:  None.      Dennis Bast, MD    ______________________________  C. Ulyess Mort, M.D.    Rivka Safer  D:  04/03/2005  T:  04/04/2005  Job:  [id number]   cc:   Georga Hacking, M.D.  Fax: [phone]  Email: [email or web address]. Lucas Mallow, M.D.  Fax: [phone]   Dennis Bast, MD  Fax: [phone]

## 2010-06-28 NOTE — Cardiovascular Report (Signed)
NAMESELASSIE, SPATAFORE NO.:  [id number]   MEDICAL RECORD NO.:  [id number]          PATIENT TYPE:  INP   LOCATION:  0349                         FACILITY:  [facility]   PHYSICIAN:  Ricki Rodriguez, M.D.  DATE OF BIRTH:  Feb 10, 1951   DATE OF PROCEDURE:  12/19/2003  DATE OF DISCHARGE:  12/23/2003                              CARDIAC CATHETERIZATION   REFERRED BY:  Eduardo Osier. Sharyn Lull, M.D.   Synchronized cardioversion.   After informed consent and sedation with 14 mg etomidate 150 joules  synchronized biphasic shock was delivered to the patient x1.  His atrial  fibrillation converted to sinus bradycardia with a heart rate of 50's.  The  patient had no complications, tolerated the procedure very well.  A 12 leak  EKG was ordered post procedure.     Ajay   ASK/MEDQ  D:  01/10/2004  T:  01/10/2004  Job:  [id number]

## 2010-06-28 NOTE — Op Note (Signed)
NAME:  Johnny Rodriguez, Johnny Rodriguez NO.:  [id number]   MEDICAL RECORD NO.:  [id number]          PATIENT TYPE:  AMB   LOCATION:  DAY                          FACILITY:  [facility]   PHYSICIAN:  Almedia Balls. Ranell Patrick, M.D. DATE OF BIRTH:  1950/09/02   DATE OF PROCEDURE:  12/15/2003  DATE OF DISCHARGE:                                 OPERATIVE REPORT   PREOPERATIVE DIAGNOSES:  Retained hardware left shoulder.   POSTOPERATIVE DIAGNOSES:  Retained hardware left shoulder.   PROCEDURE:  Hardware removal, left shoulder.   SURGEON:  Almedia Balls. Ranell Patrick, M.D.   ASSISTANT:  None.   ANESTHESIA:  General plus local.   ESTIMATED BLOOD LOSS:  Minimal.   FLUIDS REPLACED:  100 mL crystalloid.   COUNTS:  Correct. There were no complications. Perioperative antibiotics  were given.   INDICATIONS FOR PROCEDURE:  The patient is a 60 year old male status post  severe AC separation, status post AC reduction and CC screw fixation who  presents for staged hardware removal.  Informed consent was obtained.   DESCRIPTION OF PROCEDURE:  After an adequate level of anesthesia was  achieved, the patient was positioned supine on the operating table. Sterile  prep and drape of the left shoulder performed, the patient's prior incision  overlying the subcutaneous and palpable CC screw was performed after  infiltration with 0.5% Marcaine with epinephrine.  We easily identified the  screw, removed it using a Phillips head screwdriver. We also removed the  accompanying washer, thoroughly irrigated and closed the wound using  interrupted nylon sutures. A sterile dressing applied, patient taken to the  recovery room in stable condition.      SRN/MEDQ  D:  12/15/2003  T:  12/15/2003  Job:  [id number]
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
## 2010-06-28 NOTE — Consult Note (Signed)
NAME:  Johnny Rodriguez, Johnny Rodriguez NO.:  [id number]   MEDICAL RECORD NO.:  [id number]          PATIENT TYPE:  EMS   LOCATION:  MAJO                         FACILITY:  [facility]   PHYSICIAN:  Georga Hacking, M.D.DATE OF BIRTH:  24-Feb-1950   DATE OF CONSULTATION:  03/24/2005  DATE OF DISCHARGE:                                   CONSULTATION   HISTORY:  This 60 year old male has a previous history of atrial  fibrillation and has had severe cardiomyopathy.  He was seen in November  with rapid atrial fibrillation and had a severe non-ischemic cardiomyopathy  with an EF of around 10 to 20 percent.  He had a catheterization that did  now show significant disease involving the LAD or the circumflex, although  the vessels were small and had either spasm in his right coronary artery or  maybe a 50% stenosis.  He has also continued to smoke.  He was treated  intensively for heart failure, and evidently quit drinking around that time.  He has tried to cut down on smoking, but has continued to smoke.  Since that  time, his clinical condition has improved, and he has had less congestive  heart failure, and he went back spontaneously into sinus rhythm.  He has  been maintained on Coumadin, and he evidently has been taking his medicines,  which include Coreg, amiodarone, Lasix, lisinopril, as well as Coumadin.  He  has been able to work doing Curator up, and works for a [employer].  He was taken to work this morning  out near [facility], and his ride never picked him up this afternoon.  He began to walk and walked from [facility] to [place], which is a fair  number of miles, at least 3 or 4 miles.  He then became somewhat weak, short  of breath, collapsed, and was brought by EMS to [facility], where he then  began to complain of chest discomfort.  The history is somewhat difficult,  but he continues to complain of chest discomfort.  An  EKG shows a right  bundle branch block and left axis deviation.  His T waves that were  previously negative in the lateral leads are now upright, although there is  no definite ST elevation.  I saw the patient in the emergency room.  He was  quite angry at the time he was seen with elevated blood pressure, and it is  recommended that he be admitted to the hospital to rule out a myocardial  infarction.  I would not consider this to be an acute ST elevation MI, based  on the clinical history previously, as well as the catheterization just 2  months ago failing to show significant obstructive disease in the LAD or  circumflex system.   PAST HISTORY/FAMILY HISTORY/SOCIAL HISTORY/REVIEW OF SYSTEMS:  Recorded in  the previously dictated note from December 13, 2004.   PHYSICAL EXAMINATION:  GENERAL:  He is a thin, disheveled-appearing white  male in no acute distress.  VITAL SIGNS:  Blood pressure was 144/100 initially  and is currently 138/80.  SKIN:  Warm and dry.  He is not diaphoretic.  HEENT:  Extraocular muscles intact.  Pupils equal, round and reactive to  light and accommodation.  Oropharynx clear.  LUNGS:  Clear.  CARDIOVASCULAR:  Normal S1 and S2.  No S3.  A soft 1/6 murmur.  ABDOMEN:  Soft and nontender.  EXTREMITIES:  Distal pulses 2+.   EKG shows right bundle branch block and left axis deviation.   LABORATORY DATA:  All pending at the time of dictation.   IMPRESSION:  1.  Chest discomfort occurring in the setting of anger.  2.  Non-ischemic cardiomyopathy.  3.  Atrial fibrillation, currently on amiodarone and Coumadin.  4.  Right bundle branch block with left axis deviation.   RECOMMENDATIONS:  Admit to the telemetry unit.  Rule out MI as noted.  Check  laboratory work and enzymes.  Repeat echocardiogram to determine if he has  had any interval improvement in his LV function since last visit.      Georga Hacking, M.D.  Electronically Signed     WST/MEDQ  D:   03/24/2005  T:  03/24/2005  Job:  [id number]   cc:   Teaching Service

## 2010-09-25 ENCOUNTER — Inpatient Hospital Stay (HOSPITAL_COMMUNITY)
Admission: EM | Admit: 2010-09-25 | Discharge: 2010-09-30 | DRG: 308 | Disposition: A | Discharge status: Home / Self Care | Payer: Medicaid Other | Attending: Family Medicine | Admitting: Family Medicine

## 2010-09-25 ENCOUNTER — Encounter: Payer: Self-pay | Admitting: Family Medicine

## 2010-09-25 ENCOUNTER — Emergency Department (HOSPITAL_COMMUNITY): Payer: Medicaid Other

## 2010-09-25 DIAGNOSIS — I5022 Chronic systolic (congestive) heart failure: Secondary | ICD-10-CM

## 2010-09-25 DIAGNOSIS — Z91199 Patient's noncompliance with other medical treatment and regimen due to unspecified reason: Secondary | ICD-10-CM

## 2010-09-25 DIAGNOSIS — J441 Chronic obstructive pulmonary disease with (acute) exacerbation: Secondary | ICD-10-CM | POA: Diagnosis present

## 2010-09-25 DIAGNOSIS — I2510 Atherosclerotic heart disease of native coronary artery without angina pectoris: Secondary | ICD-10-CM | POA: Diagnosis present

## 2010-09-25 DIAGNOSIS — I4729 Other ventricular tachycardia: Secondary | ICD-10-CM | POA: Diagnosis not present

## 2010-09-25 DIAGNOSIS — K5900 Constipation, unspecified: Secondary | ICD-10-CM | POA: Diagnosis not present

## 2010-09-25 DIAGNOSIS — Z8673 Personal history of transient ischemic attack (TIA), and cerebral infarction without residual deficits: Secondary | ICD-10-CM

## 2010-09-25 DIAGNOSIS — Z9119 Patient's noncompliance with other medical treatment and regimen: Secondary | ICD-10-CM

## 2010-09-25 DIAGNOSIS — Z7901 Long term (current) use of anticoagulants: Secondary | ICD-10-CM

## 2010-09-25 DIAGNOSIS — F17200 Nicotine dependence, unspecified, uncomplicated: Secondary | ICD-10-CM

## 2010-09-25 DIAGNOSIS — R55 Syncope and collapse: Secondary | ICD-10-CM | POA: Diagnosis present

## 2010-09-25 DIAGNOSIS — I472 Ventricular tachycardia, unspecified: Secondary | ICD-10-CM | POA: Diagnosis not present

## 2010-09-25 DIAGNOSIS — I4891 Unspecified atrial fibrillation: Secondary | ICD-10-CM

## 2010-09-25 DIAGNOSIS — I428 Other cardiomyopathies: Secondary | ICD-10-CM | POA: Diagnosis present

## 2010-09-25 DIAGNOSIS — I5023 Acute on chronic systolic (congestive) heart failure: Secondary | ICD-10-CM | POA: Diagnosis present

## 2010-09-25 DIAGNOSIS — I252 Old myocardial infarction: Secondary | ICD-10-CM

## 2010-09-25 LAB — COMPREHENSIVE METABOLIC PANEL
ALT: 48 U/L (ref 0–53)
AST: 32 U/L (ref 0–37)
Albumin: 3.7 g/dL (ref 3.5–5.2)
Alkaline Phosphatase: 60 U/L (ref 39–117)
BUN: 20 mg/dL (ref 6–23)
CO2: 29 mEq/L (ref 19–32)
Calcium: 9.4 mg/dL (ref 8.4–10.5)
Chloride: 103 mEq/L (ref 96–112)
Creatinine, Ser: 1.11 mg/dL (ref 0.50–1.35)
GFR calc Af Amer: >60 mL/min (ref >60)
GFR calc non Af Amer: >60 mL/min (ref >60)
Glucose, Bld: 103 mg/dL — ABNORMAL HIGH (ref 70–99)
Potassium: 4.4 mEq/L (ref 3.5–5.1)
Sodium: 139 mEq/L (ref 135–145)
Total Bilirubin: 0.4 mg/dL (ref 0.3–1.2)
Total Protein: 6.5 g/dL (ref 6.0–8.3)

## 2010-09-25 LAB — CBC
HCT: 41.3 % (ref 39.0–52.0)
Hemoglobin: 14.1 g/dL (ref 13.0–17.0)
MCH: 32.2 pg (ref 26.0–34.0)
MCHC: 34.1 g/dL (ref 30.0–36.0)
MCV: 94.3 fL (ref 78.0–100.0)
Platelets: 199 10*3/uL (ref 150–400)
RBC: 4.38 MIL/uL (ref 4.22–5.81)
RDW: 14.1 % (ref 11.5–15.5)
WBC: 8.7 10*3/uL (ref 4.0–10.5)

## 2010-09-25 LAB — CK TOTAL AND CKMB (NOT AT ARMC)
CK, MB: 4.8 ng/mL — ABNORMAL HIGH (ref 0.3–4.0)
Relative Index: 4.2 — ABNORMAL HIGH (ref 0.0–2.5)
Total CK: 114 U/L (ref 7–232)

## 2010-09-25 LAB — PRO B NATRIURETIC PEPTIDE: Pro B Natriuretic peptide (BNP): 4311 pg/mL — ABNORMAL HIGH (ref 0–125)

## 2010-09-25 LAB — TROPONIN I: Troponin I: <0.3 ng/mL (ref <0.30)

## 2010-09-25 NOTE — H&P (Signed)
Family Medicine Teaching [facility] Admission History and Physical  Patient name: Johnny Rodriguez Medical record number: [id number] Date of birth: Jan 04, 1951 Age: 60 y.o. Gender: male  Primary Care Provider: No primary provider on file.  Chief Complaint: SOB and palpitations History of Present Illness: Johnny Rodriguez is a 60 y.o. year old male presenting with SOB. Johnny Rodriguez notes increasing SOB especially with exertion over the past 1 week. However the SOB worsened this evening to the point that it was present even at rest. Johnny Rodriguez had been seeing his cardiologist Dr. Donnie Rodriguez however he has been unable to afford his medications and was lost to follow up. He also notes some increased wheezing and productive cough recently as well.  He notes some chest pains over the last week with these episodes. He continues to smoke. No fevers or chills.   In the ED he was given an albuterol nebulizer. Following the nebs he had episodes whe he appeared to become unconsciousness for brief periods of time. This lasted on total around 5 minutes. During these episodes he continued to have A fib with rate 110 without any wide complex tachycardia or bradycardia. Pulse palpable during episodes.   Patient Active Problem List  Diagnoses  . ANGINA PECTORIS  . CARDIOMYOPATHY  . ATRIAL FIBRILLATION  . COPD   Past Medical History: A. Fib Cardiomyopathy with EF 10-20% CVA CAD Hx MI   Past Surgical History: Shoulder surgery  Social History: Smokes, drinks, no drugs. Currently living in assisted housing and will be able to afford his medications. Last drink 5 days ago. History of healy alcohol use but not much recently.   Family History: Heart disease  Allergies: Allergies not on file  Not taking any medications currently but in the past was on  Coreg, amiodarone, Lasix, lisinopril, as well as Coumadin  Review Of Systems: Per HPI  Otherwise 12 point review of systems was performed and was  unremarkable.  Physical Exam: Pulse: 135  Blood Pressure: 120/100 RR: 18   O2: 96 on ra Temp: 98.1  General: alert, cooperative and mild distress HEENT: PERRLA, extra ocular movement intact and sclera clear, anicteric Heart: Irreg irreg. Heart sounds difficult to appreciate over wheezing Lungs: wheezing, expiratory slowing and expiratory wheezes. Clearing to diminished BS following albuterol nebs.  Abdomen: abdomen is soft without significant tenderness, masses, organomegaly or guarding Extremities: extremities normal, atraumatic, no cyanosis or edema Skin:no rashes Neurology: AO3  Labs and Imaging: Lab Results  Component Value Date/Time   NA 139 09/25/2010  8:44 PM   K 4.4 09/25/2010  8:44 PM   CL 103 09/25/2010  8:44 PM   CO2 29 09/25/2010  8:44 PM   BUN 20 09/25/2010  8:44 PM   CREATININE 1.11 09/25/2010  8:44 PM   GLUCOSE 103* 09/25/2010  8:44 PM   Lab Results  Component Value Date   WBC 8.7 09/25/2010   HGB 14.1 09/25/2010   HCT 41.3 09/25/2010   MCV 94.3 09/25/2010   PLT 199 09/25/2010   BNP: 4000  CXR: IMPRESSION:  1. Enlargement of the cardiopericardial silhouette compared to  prior study of 2008. Interval development of cardiomegaly or  pericardial effusion cannot be excluded. Heart size best assessed  on a two-view chest radiograph.  2. The lungs are clear.  Lab Results  Component Value Date   CKTOTAL 114 09/25/2010   CKMB 4.8* 09/25/2010   TROPONINI <0.30 09/25/2010   ABG: 7.439/38/84 97%  Assessment and Plan: Johnny Rodriguez is  a 60 y.o. year old male presenting with SOB . Shortness of breath: Likely two separate etiologies.  1. A. Fib with RVR. Johnny Rodriguez has a history of A, Fib and Systolic heart failure and has not been taking his medication for quite some time. He come to the ED with a rate of 130s and SOB w/ exertion. I suspect that his SOB is due to reduced cardiac output with his tachycardia. He was controlled with a diltiazem drip to a rate in the 80s.  Plan to  continue the diltiazem drip overnight and follow up in the morning. May likely restart his home medication vs convert to oral diltiazem. As this patient has severe disease we will also obtain an ECHO and consult his primary cardiologist. Will also cycle cardiac enzymes. 2. COPD/Asthma: Ran out of his inhailler recently. Currently has productive cough. His lungs exam was positive for long expiratory phase and wheeze.  Plan to treat with nebs, steroids and doxycycline. Will follow in the morning.   3. Question syncope in ED: Not sure of etiology as well. Doubtful is arrythmia as I was watching the monitor and checking pulse at the time. Perhaps some adverse reaction to albuterol. ABG is very normal. Plan to admit to step down and cycle enzymes as above and follow. Will use xoponex nebs.  4. HTN: As above will follow tonight and consider restarting some medications in the morning. Will likely chose ACEi first 5. Tobacco Abuse: Continued despite his severe disease. Will provide a nicotine patch should he require it.  6.  Alcohol use: Reduced. No history of Alcohol withdrawal. Will follow and start CIWA if needed.  7. FEN/GI: SLIV and normal diet 8. Prophylaxis: Heparin 9. Disposition: When feeling well.

## 2010-09-26 LAB — TSH: TSH: 2.675 u[IU]/mL (ref 0.350–4.500)

## 2010-09-26 LAB — POCT I-STAT 3, ART BLOOD GAS (G3+)
Acid-Base Excess: 2 mmol/L (ref 0.0–2.0)
Bicarbonate: 26.1 mEq/L — ABNORMAL HIGH (ref 20.0–24.0)
O2 Saturation: 97 %
Patient temperature: 98
TCO2: 27 mmol/L (ref 0–100)
pCO2 arterial: 38 mmHg (ref 35.0–45.0)
pH, Arterial: 7.444 (ref 7.350–7.450)
pO2, Arterial: 82 mmHg (ref 80.0–100.0)

## 2010-09-26 LAB — COMPREHENSIVE METABOLIC PANEL
ALT: 41 U/L (ref 0–53)
AST: 24 U/L (ref 0–37)
Albumin: 3.4 g/dL — ABNORMAL LOW (ref 3.5–5.2)
Alkaline Phosphatase: 60 U/L (ref 39–117)
BUN: 20 mg/dL (ref 6–23)
CO2: 25 mEq/L (ref 19–32)
Calcium: 9 mg/dL (ref 8.4–10.5)
Chloride: 107 mEq/L (ref 96–112)
Creatinine, Ser: 1.11 mg/dL (ref 0.50–1.35)
GFR calc Af Amer: >60 mL/min (ref >60)
GFR calc non Af Amer: >60 mL/min (ref >60)
Glucose, Bld: 124 mg/dL — ABNORMAL HIGH (ref 70–99)
Potassium: 4.1 mEq/L (ref 3.5–5.1)
Sodium: 140 mEq/L (ref 135–145)
Total Bilirubin: 0.5 mg/dL (ref 0.3–1.2)
Total Protein: 6.2 g/dL (ref 6.0–8.3)

## 2010-09-26 LAB — POCT I-STAT TROPONIN I: Troponin i, poc: 0.03 ng/mL (ref 0.00–0.08)

## 2010-09-26 LAB — CBC
HCT: 39.6 % (ref 39.0–52.0)
Hemoglobin: 13.4 g/dL (ref 13.0–17.0)
MCH: 32.1 pg (ref 26.0–34.0)
MCHC: 33.8 g/dL (ref 30.0–36.0)
MCV: 94.7 fL (ref 78.0–100.0)
Platelets: 184 10*3/uL (ref 150–400)
RBC: 4.18 MIL/uL — ABNORMAL LOW (ref 4.22–5.81)
RDW: 14.3 % (ref 11.5–15.5)
WBC: 6.9 10*3/uL (ref 4.0–10.5)

## 2010-09-26 LAB — LIPID PANEL
Cholesterol: 165 mg/dL (ref 0–200)
HDL: 46 mg/dL (ref >39)
LDL Cholesterol: 93 mg/dL (ref 0–99)
Total CHOL/HDL Ratio: 3.6 RATIO
Triglycerides: 128 mg/dL (ref <150)
VLDL: 26 mg/dL (ref 0–40)

## 2010-09-26 LAB — MRSA PCR SCREENING: MRSA by PCR: NEGATIVE

## 2010-09-26 LAB — PROTIME-INR
INR: 1.04 (ref 0.00–1.49)
Prothrombin Time: 13.8 seconds (ref 11.6–15.2)

## 2010-09-26 LAB — TROPONIN I: Troponin I: <0.3 ng/mL (ref <0.30)

## 2010-09-27 LAB — PROTIME-INR
INR: 1.06 (ref 0.00–1.49)
Prothrombin Time: 14 seconds (ref 11.6–15.2)

## 2010-09-27 LAB — BASIC METABOLIC PANEL
BUN: 17 mg/dL (ref 6–23)
CO2: 28 mEq/L (ref 19–32)
Calcium: 9.5 mg/dL (ref 8.4–10.5)
Chloride: 102 mEq/L (ref 96–112)
Creatinine, Ser: 1.01 mg/dL (ref 0.50–1.35)
GFR calc Af Amer: >60 mL/min (ref >60)
GFR calc non Af Amer: >60 mL/min (ref >60)
Glucose, Bld: 118 mg/dL — ABNORMAL HIGH (ref 70–99)
Potassium: 4.4 mEq/L (ref 3.5–5.1)
Sodium: 138 mEq/L (ref 135–145)

## 2010-09-28 DIAGNOSIS — I509 Heart failure, unspecified: Secondary | ICD-10-CM

## 2010-09-28 LAB — CBC
HCT: 42.6 % (ref 39.0–52.0)
Hemoglobin: 14.9 g/dL (ref 13.0–17.0)
MCH: 32.7 pg (ref 26.0–34.0)
MCHC: 35 g/dL (ref 30.0–36.0)
MCV: 93.4 fL (ref 78.0–100.0)
Platelets: 218 10*3/uL (ref 150–400)
RBC: 4.56 MIL/uL (ref 4.22–5.81)
RDW: 14.2 % (ref 11.5–15.5)
WBC: 12.6 10*3/uL — ABNORMAL HIGH (ref 4.0–10.5)

## 2010-09-28 LAB — BASIC METABOLIC PANEL
BUN: 24 mg/dL — ABNORMAL HIGH (ref 6–23)
CO2: 26 mEq/L (ref 19–32)
Calcium: 9.4 mg/dL (ref 8.4–10.5)
Chloride: 102 mEq/L (ref 96–112)
Creatinine, Ser: 1.1 mg/dL (ref 0.50–1.35)
GFR calc Af Amer: >60 mL/min (ref >60)
GFR calc non Af Amer: >60 mL/min (ref >60)
Glucose, Bld: 108 mg/dL — ABNORMAL HIGH (ref 70–99)
Potassium: 3.8 mEq/L (ref 3.5–5.1)
Sodium: 138 mEq/L (ref 135–145)

## 2010-09-28 LAB — PROTIME-INR
INR: 1.18 (ref 0.00–1.49)
Prothrombin Time: 15.3 seconds — ABNORMAL HIGH (ref 11.6–15.2)

## 2010-09-29 LAB — PROTIME-INR
INR: 1.06 (ref 0.00–1.49)
Prothrombin Time: 14 seconds (ref 11.6–15.2)

## 2010-09-29 NOTE — Consult Note (Signed)
NAME:  Johnny Rodriguez, Johnny Rodriguez NO.:  [id number]  MEDICAL RECORD NO.:  [id number]  LOCATION:  [facility]                         FACILITY:  [facility]  PHYSICIAN:  Johnny Bathe, MD      DATE OF BIRTH:  [birth year]  DATE OF CONSULTATION:  09/26/2010 DATE OF DISCHARGE:                                CONSULTATION   CARDIOLOGIST:  Johnny Hacking, MD  PRIMARY PHYSICIAN:  No primary on file.  HISTORY OF PRESENT ILLNESS:  Johnny Rodriguez is a 60 year old male with prior nonischemic cardiomyopathy in 2006, by cardiac catheterization with return of cardiac function in 2008, with echocardiogram showing an EF of 50% who presented with 2-3 weeks of worsening shortness of breath, wheezing and rapid heart rate.  Over the past week, it has been quite significant.  He was not even able to rest over the past night.  He notes that he has been unable to afford his medication and he was lost to followup.  He has had some wheezing as well as cough.  Chest pains have occurred over the last week, occasionally with these episodes.  He is a heavy smoker.  In the emergency department, he was given albuterol nebulizers and while the resident was with him he "lost consciousness" for a brief period of time.  This seemed to last approximately 5 minutes.  During these episodes, he on telemetry had atrial fibrillation with a heart rate of 110 without any significant wide complex tachycardia or bradycardia.  The pulse was definitely palpable during these instances.  His blood pressure during these times seemed to be in the 90s to 110s.  PAST MEDICAL HISTORY: 1. 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 2. Atrial fibrillation - previously on amiodarone as well as Coumadin. 3. Prior history of stroke. 4. Prior history of mobile left atrial thrombus. 5. Prior bradycardia with treatment of Betapace or sotalol.  This is  according to Johnny Rodriguez consult note on September 15, 2006. 6. Significant tobacco use.  PAST SURGICAL HISTORY:  Prior shoulder surgery.  SOCIAL HISTORY:  Smokes, drinks, no drugs, currently lives in assisted housing, but maybe able to afford his own medications soon, last drink was approximately 5 days ago, uses alcohol heavily, but not much recently.  FAMILY HISTORY:  He does have a family history of heart disease.  ALLERGIES:  No known drug allergies.  MEDICATIONS:  Not taking currently any, but in the past he had been on Coreg, amiodarone, Lasix, lisinopril as well as Coumadin.  PHYSICAL EXAMINATION:  VITAL SIGNS:  Blood pressure ranging from 117/76 to 120/100, respirations 18, satting 96% on room air, temperature 98.1 and pulse previously 135 currently 85 irregularly irregular. GENERAL:  He is alert and oriented x3, sitting upright in bed.  Mild increased respiratory effort. EYES:  Well-perfused conjunctivae.  EOMI.  No scleral icterus. NECK:  Supple.  No lymphadenopathy.  No thyromegaly.  No carotid bruits. Positive JVD.  Mid neckline. HEART:  Irregularly irregular rhythm.  No appreciable S3, 2/6 systolic murmur heard apex. LUNGS:  Mildly diminished at bases, scattered wheezes.  ABDOMEN:  Soft, nontender, normoactive bowel sounds.  No rebound.  No guarding.  No bruits. EXTREMITIES:  No edema noted, very hyperpigmented/tan skin.  Warm extremities. GU:  Deferred. RECTAL:  Deferred. NEURO:  Nonfocal.  Cranial nerves II through XII grossly intact.  LABORATORY DATA:  EKG personally reviewed from September 26, 2010, at 3:30 a.m. showed atrial fibrillation, heart rate of 93 beats per minute with left anterior fascicular block and T-wave inversion noted in V5 and V6. No significant change when compared to prior EKG.  Chest x-ray shows cardiomegaly, enlargement of the silhouette compared to 2008 and lungs appear to be clear.  TSH is normal.  LDL cholesterol 93, creatinine  1.1, glucose 124, potassium 4.1 and BNP is 4311.  Troponin is negative. Prior echocardiograms reviewed.  Catheterization reviewed from December 16, 2004, showing dilated ventricle with severe global hypokinesis.  No significant CAD at that time.  ASSESSMENT AND PLAN:  A 60 year old male with return of nonischemic cardiomyopathy, atrial fibrillation, ongoing tobacco use with elevated BNP and evidence of volume overload. 1. Acute on chronic systolic heart failure - agree currently with     discontinuation of low-dose diltiazem because of its negative     inotropic qualities.  We tried to reinitiate beta-blocker.  Toprol     is reasonable.  This will help to not only control his atrial     fibrillation rate, but will also help with his heart failure.  In     2006 was his original diagnosis of nonischemic cardiomyopathy with     a EF of 15-20%.  In 2008, his EF returned to 50%, hence improved     on.  I would assume medical management.  I told him that he has an     opportunity today to have the same thing occur if he complies with     medical management, although this is not guaranteed.  Tried to     initiate ACE inhibitor if tolerated.  I also agree with Coumadin     use, especially given his prior left atrial thrombus and dilated     cardiomyopathy and atrial fibrillation.  He is at increased risk     for stroke.  In addition, his IVC is very dilated indicative of     increased preload and shows some mild increased respiratory effort.     We will go ahead and recommend Lasix therapy as well.  Monitor     urine output. 2. Tobacco use - tobacco cessation counseling. 3. Chronic anticoagulation - encouraged Coumadin, heparin bridge. 4. History of medical noncompliance - encourage medication use.     Johnny Bathe, MD     MCS/MEDQ  D:  09/26/2010  T:  09/26/2010  Job:  [id number]  cc:   Johnny Rodriguez, M.D.  Electronically Signed by Johnny Schultz MD on 09/29/2010 07:35:21 AM

## 2010-09-30 LAB — CK TOTAL AND CKMB (NOT AT ARMC)
CK, MB: 2.6 ng/mL (ref 0.3–4.0)
CK, MB: 4.3 ng/mL — ABNORMAL HIGH (ref 0.3–4.0)
Relative Index: INVALID (ref 0.0–2.5)
Relative Index: INVALID (ref 0.0–2.5)
Total CK: 30 U/L (ref 7–232)
Total CK: 80 U/L (ref 7–232)

## 2010-09-30 LAB — TROPONIN I
Troponin I: <0.3 ng/mL (ref <0.30)
Troponin I: <0.3 ng/mL (ref <0.30)

## 2010-09-30 LAB — PROTIME-INR
INR: 1.04 (ref 0.00–1.49)
Prothrombin Time: 13.8 seconds (ref 11.6–15.2)

## 2010-10-08 ENCOUNTER — Encounter: Payer: Self-pay | Admitting: Family Medicine

## 2010-10-08 NOTE — Progress Notes (Signed)
Recd CMn for stationary commode w fixed arm. We d/c from hospital after admission a fib rvr and copd. I filled this out. 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

## 2010-10-16 NOTE — Discharge Summary (Signed)
NAMEMarland Kitchen  Johnny Rodriguez, Johnny Rodriguez NO.:  [id number]  MEDICAL RECORD NO.:  [id number]  LOCATION:  2040                         FACILITY:  [facility]  PHYSICIAN:  Leighton Roach McDiarmid, M.D.DATE OF BIRTH:  1950/12/29  DATE OF ADMISSION:  09/25/2010 DATE OF DISCHARGE:  09/30/2010                        DISCHARGE SUMMARY - REFERRING   ATTENDING PHYSICIAN:  Leighton Roach McDiarmid, MD.  PRIMARY CARE PHYSICIAN:  [facility].  CARDIOLOGIST:  Georga Hacking, MD, at [facility].  CONSULTANTS:  Jake Bathe, MD, at [facility].  DISCHARGED DIAGNOSES: 1. Congestive heart failure exacerbation secondary to nonischemic     cardiomyopathy with ejection fraction of 15% to 20%. 2. Atrial fibrillation. 3. Chronic obstructive pulmonary disease exacerbation. 4. Chest pains. 5. Syncope.  DISCHARGE MEDICATIONS: 1. Aspirin 81 mg once daily. 2. Lasix 40 mg twice daily. 3. Lisinopril 2.5 mg twice daily. 4. Metoprolol XL succinate 50 mg once daily. 5. Spiriva 18 mcg once daily. 6. Warfarin 5 mg take as directed, took 1.5 pills today, once daily.  LABS AT DISCHARGE/PERTINENT LABS:  Troponin negative x3.  ProBNP 4311. CBC within normal limits.  Risk stratification labs which included fasting lipid profile TSH within normal limits.  Echo severely reduced systolic function with EF of 15% to 20%, diffuse hypokinesis, mild LVH, mild mitral regurgitation, mild left atrial dilation, moderate right atrial and right ventricular dilation, dilated IVC.  This echo was compared to the last echo done in 2008.  EKG, enlargement of the cardio- pericardial silhouette compared to the prior study of 2008.  Normal clear lungs.  HOSPITAL COURSE:  The patient is a 60 year old male who presented with 2- 3 weeks of worsening shortness of breath, wheezing, cough, and rapid heart rate.  The patient had been unable to afford his medications recently and had insufficient followup.  The patient also complained  of mild midsternal chest pain that did not radiate into his jaw, neck, or arm, and that was associated with cough/wheezing episodes.  He did have witness syncope x1 in the ED after albuterol nebulizer.  During this episode, the patient had atrial fibrillation on tele without significant wide complex tachycardia or bradycardia.  Heart rate of 110 with palpable pulse, blood pressure of 90 to 110.  No further syncopal episodes were noted during this admission. 1. CHF exacerbation secondary to nonischemic cardiomyopathy.  The     patient was thought to be having acute decompensated heart failure     secondary to return of nonischemic cardiomyopathy as shown by     decreased EF of 15% of 20%.  The patient sees Dr. Donnie Aho at [facility] [place]     [facility] and we consulted him at this visit.  The patient had     nonischemic cardiomyopathy with an EF of 10% in 2006, and with     medical management had return of cardiac function by 2008, with EF     of 50%.  The patient was treated with Lasix and started slowly on     Toprol-XL and lisinopril and titrated up.  Per Cardiology, the     patient will continue medical management and have repeat echo in 45-     90 days.  If his cardiac function is returning at that time, we     will continue with medical management.  If his cardiac function is     still poor at that time as evidenced by still low EF, he would be a     candidate for an AICD.  The patient had a 8-beat run of     nonsustained venous tachycardia and a 13-beat run of NSVT within     the first 48 hours of admission.  The patient was asymptomatic     during both episodes and the NSVT did not occur again.  Unclear     etiology at this point. 2. Atrial fibrillation.  The patient was in atrial fibrillation with     RVR on admission.  For initial rate control, the patient was put on     a diltiazem drip.  We transitioned him to metoprolol and titrated     up.  The patient had described some transportation  issues, but     ultimately he needs Warfarin for anticoagulation.  He will have     Home Health RN draw his blood on October 01, 2010, and fax the INR     results to Dr. York Spaniel office so they can continue to titrate his     Warfarin. 3. COPD exacerbation.  The patient's shortness of breath was thought     to be multifactorial given his CHF exacerbation and likely COPD     exacerbation.  The patient had moderate wheezing on exam at     admission.  We treated his COPD exacerbation with Xopenex nebulizer     treatments and a 5-day course of prednisone 50 mg and doxycycline.     The patient was started on Spiriva and had teaching about COPD.     The patient is a current smoker.  We recommend the patient have     outpatient PFTs at some point in the next 3 months. 4. Chest pain.  The patient's initial chest pain was thought to be     musculoskeletal secondary to increased respiratory effort and     coughing.  The patient had negative troponins x3.  On the day of     discharge, the patient had chest pain that radiated into his jaw     and happened after he walked back from the restroom.  The patient     was given nitroglycerin p.o. and Xanax 0.25 mg p.o. and the chest     pain resolved.  Cardiac enzymes were negative x2 and there was no     acute change on EKG.  There might be some portion of this that is     stable angina, but this can continue to be followed up on an     outpatient basis. 5. Tobacco abuse.  The patient is a heavy smoker currently.  While in     the hospital, he received education about smoking cessation as well     as various smoking cessation tools.  The patient did not seem     motivated to quit at this time.  Follow up via outpatient. 6. Patient condition.  At discharge, the patient denied shortness of     breath or chest pain.  Pulmonary exam was without wheezes or     crackles.  Cardiovascular exam, the patient was irregularly     irregular with normal rate.  No  murmurs, rubs, or gallops.  DISCHARGE FOLLOWUP: 1. [facility] with Dr. Andrey Campanile  on November 08, 2010, at 02:30 p.m. 2. Dr. Donnie Aho with [facility] on October 02, 2010, at 09:00 a.m. 3. Home Health RN to come to home for blood draws to check INR.  FOLLOWUP ISSUES:  Noncompliance.  The patient has been unable to afford his medications secondary to high rents.  He states he is now in assisted housing and is able to afford his medications.  Close followup is essential for him because of need for titration of Coumadin and medical management of heart failure.  He stated an unwillingness to speak with Social Work claiming a bad experience with hospital social work in the past.  He seems to have unrealistic expectations of what social work could indeed provide him with.  For instance, he expected his sister to be reimbursed for stamps and envelopes she used to apply for assistance for the patient.     Clementeen Graham, MD   ______________________________ Leighton Roach McDiarmid, M.D.    EC/MEDQ  D:  10/01/2010  T:  10/01/2010  Job:  [id number]  cc:   Leighton Roach McDiarmid, M.D. [facility] Antony Blackbird, M.D. Jake Bathe, MD  Electronically Signed by Clementeen Graham  on 10/11/2010 08:50:22 AM Electronically Signed by Acquanetta Belling M.D. on 10/16/2010 08:12:25 AM

## 2010-11-25 ENCOUNTER — Other Ambulatory Visit: Payer: Self-pay | Admitting: Family Medicine

## 2010-11-25 LAB — BASIC METABOLIC PANEL
BUN: 27 — ABNORMAL HIGH
CO2: 22
Calcium: 9.1
Chloride: 106
Creatinine, Ser: 1.48
GFR calc Af Amer: 60 — ABNORMAL LOW
GFR calc non Af Amer: 50 — ABNORMAL LOW
Glucose, Bld: 104 — ABNORMAL HIGH
Potassium: 3.6
Sodium: 135

## 2010-11-25 LAB — DIFFERENTIAL
Basophils Absolute: 0
Basophils Relative: 0
Eosinophils Absolute: 0
Eosinophils Relative: 1
Lymphocytes Relative: 23
Lymphs Abs: 1.6
Monocytes Absolute: 0.5
Monocytes Relative: 7
Neutro Abs: 4.9
Neutrophils Relative %: 70

## 2010-11-25 LAB — POCT CARDIAC MARKERS
CKMB, poc: 3.7
Myoglobin, poc: 82.1
Operator id: 285841
Troponin i, poc: <0.05

## 2010-11-25 LAB — COMPREHENSIVE METABOLIC PANEL
ALT: 11
AST: 21
Albumin: 3.9
Alkaline Phosphatase: 65
BUN: 28 — ABNORMAL HIGH
CO2: 23
Calcium: 9.2
Chloride: 106
Creatinine, Ser: 1.15
GFR calc Af Amer: >60
GFR calc non Af Amer: >60
Glucose, Bld: 97
Potassium: 3.8
Sodium: 136
Total Bilirubin: 1
Total Protein: 6.9

## 2010-11-25 LAB — ETHANOL: Alcohol, Ethyl (B): <5

## 2010-11-25 LAB — CBC
HCT: 44.8
HCT: 45.2
Hemoglobin: 15.3
Hemoglobin: 15.3
MCHC: 33.8
MCHC: 34.2
MCV: 93.8
MCV: 93.8
Platelets: 274
Platelets: 278
RBC: 4.78
RBC: 4.82
RDW: 13.5
RDW: 14
WBC: 7
WBC: 7.1

## 2010-11-25 LAB — RAPID URINE DRUG SCREEN, HOSP PERFORMED
Amphetamines: NOT DETECTED
Barbiturates: NOT DETECTED
Benzodiazepines: NOT DETECTED
Cocaine: NOT DETECTED
Opiates: NOT DETECTED
Tetrahydrocannabinol: POSITIVE — AB

## 2010-11-25 LAB — CK TOTAL AND CKMB (NOT AT ARMC)
CK, MB: 7.1 — ABNORMAL HIGH
Relative Index: 4.1 — ABNORMAL HIGH
Total CK: 174

## 2010-11-25 LAB — CARDIAC PANEL(CRET KIN+CKTOT+MB+TROPI)
CK, MB: 4.4 — ABNORMAL HIGH
CK, MB: 4.8 — ABNORMAL HIGH
Relative Index: 4.8 — ABNORMAL HIGH
Relative Index: INVALID
Total CK: 100
Total CK: 88
Troponin I: 0.01
Troponin I: 0.01

## 2010-11-25 LAB — LIPID PANEL
Cholesterol: 200
HDL: 50
LDL Cholesterol: 123 — ABNORMAL HIGH
Total CHOL/HDL Ratio: 4
Triglycerides: 137
VLDL: 27

## 2010-11-25 LAB — TROPONIN I: Troponin I: 0.01

## 2010-11-25 LAB — URINALYSIS, ROUTINE W REFLEX MICROSCOPIC
Bilirubin Urine: NEGATIVE
Glucose, UA: NEGATIVE
Hgb urine dipstick: NEGATIVE
Ketones, ur: 15 — AB
Nitrite: NEGATIVE
Protein, ur: NEGATIVE
Specific Gravity, Urine: 1.021
Urobilinogen, UA: 0.2
pH: 6

## 2010-11-25 LAB — PROTIME-INR
INR: 1
INR: 1
Prothrombin Time: 13.7
Prothrombin Time: 13.7

## 2010-11-25 LAB — TSH: TSH: 1.028

## 2010-11-25 LAB — APTT: aPTT: 28

## 2010-11-29 ENCOUNTER — Other Ambulatory Visit: Payer: Self-pay | Admitting: Family Medicine

## 2011-01-15 ENCOUNTER — Ambulatory Visit
Admission: RE | Admit: 2011-01-15 | Discharge: 2011-01-15 | Disposition: A | Discharge status: Home / Self Care | Payer: Medicaid Other | Source: Ambulatory Visit | Attending: Cardiology | Admitting: Cardiology

## 2011-01-15 ENCOUNTER — Other Ambulatory Visit: Payer: Self-pay | Admitting: Cardiology

## 2011-01-15 ENCOUNTER — Encounter: Payer: Self-pay | Admitting: Cardiology

## 2011-01-15 DIAGNOSIS — I2510 Atherosclerotic heart disease of native coronary artery without angina pectoris: Secondary | ICD-10-CM

## 2011-01-15 DIAGNOSIS — I4891 Unspecified atrial fibrillation: Secondary | ICD-10-CM

## 2011-01-15 DIAGNOSIS — J449 Chronic obstructive pulmonary disease, unspecified: Secondary | ICD-10-CM

## 2011-01-15 DIAGNOSIS — I428 Other cardiomyopathies: Secondary | ICD-10-CM

## 2011-01-15 NOTE — H&P (Signed)
Johnny Rodriguez  Date of visit:  01/15/2011 DOB:  March 06, 1950    Age:  60 yrs. Medical record number:  [id number]     Account number:  [id number] Primary Care Provider: Fransisco Hertz ____________________________ CURRENT DIAGNOSES  1. Congestive Heart Failure  2. Personal History Of Noncompliance With Medical Treatment Presenting Hazards To Health  3. Long Term Use Anticoagulant  4. COPD  5. CAD,Native  6. Cardiomyopathy Idiopathic  7. Arrhythmia-Atrial Fibrillation ____________________________ ALLERGIES  NKDA ____________________________ MEDICATIONS  1. Spiriva with HandiHaler 18 mcg capsule, w/inhalation device, qd  2. Pradaxa 150 mg capsule, BID  3. furosemide 40 mg tablet, BID  4. lisinopril 5 mg tablet, 1 p.o. daily  5. metoprolol succinate 50 mg tablet extended release 24 hr, 1 p.o. daily  6. amiodarone 200 mg tablet, 1 p.o. daily  7. aspirin 81 mg tablet, chewable, 1 p.o. daily ____________________________ CHIEF COMPLAINTS  Followup of Arrhythmia-Atrial Fibrillation  Followup of CAD,Native ____________________________ HISTORY OF PRESENT ILLNESS  Patient seen for cardiac followup. He has tolerated the initiation of amiodarone fine and is not having any side effects from it but continues in atrial fibrillation. He continues to have dyspnea on exertion and some fatigue. He has had no bleeding complications from Pradaxa. His echocardiogram today shows an ejection fraction of about 25% and continued global hypokinesis. We talked about attempts o convert him back to sinus rhythm and do cardioversion.  ____________________________ PAST HISTORY  Past Medical Illnesses:  seizures, hypertension, history of alcohol abuse;  Cardiovascular Illnesses:  atrial fibrillation, cardiomyopathy(dilated);  Surgical Procedures:  fx collar bone;  Cardiology Procedures-Invasive:  cardiac cath (left) November 2006;  Cardiology Procedures-Noninvasive:  echocardiogram August 2012, echocardiogram December  2012;  Cardiac Cath Results:  50% stenosis RCA, normal Left main, no significant disease LAD, no significant disease CFX;  LVEF of 20% documented via echocardiogram on 09/26/2010 ____________________________ CARDIO-PULMONARY TEST DATES EKG Date:  01/15/2011;   Cardiac Cath Date:  12/16/2004;  Echocardiography Date: 01/15/2011;  Chest Xray Date: 09/25/2010;   ____________________________ SOCIAL HISTORY Alcohol Use:  history of alcohol abuse, currently not drinking;  Smoking:  greater than 50 pack year history, smokes cigarettes, less than 1 ppd;  Diet:  regular diet and no added salt;  Lifestyle:  single;  Exercise:  some exercise and walking;  Occupation:  Holiday representative;  Residence:  lives alone;   ____________________________ REVIEW OF SYSTEMS General:  malaise and fatigue  Respiratory:  denies dyspnea, cough, wheezing or hemoptysis.  Cardiovascular:  please review HPI Abdominal:  denies dyspepsia, GI bleeding, constipation, or diarrhea  Musculoskeletal:  occasional weakness of legs  Neurological:  numbness in left hand  Psychiatric:  history of panic disorder ____________________________ PHYSICAL EXAMINATION VITAL SIGNS  Blood Pressure:  88/60 Sitting, Left arm, regular cuff  , 80/62 Standing, Left arm and regular cuff   Pulse:  58/min. Weight:  162.00 lbs. Height:  71"BMI: 22  Constitutional:  disheveled appearing white male in no acute distress Skin:  warm and dry to touch, no apparent skin lesions, or masses noted. Head:  normocephalic, normal hair pattern, no masses or tenderness ENT:  ears, nose and throat reveal no gross abnormalities.  Dentition good. Neck:  supple, no masses, thyromegaly, JVD. Carotid pulses are full and equal bilaterally without bruits. Chest:  normal symmetry, clear to auscultation and percussion. Cardiac:  irregularly irregular rhythm, normal S1 and S2, no S3 or S4, no murmurs,clicks, or rub heard Peripheral Pulses:  the femoral,dorsalis pedis, and posterior  tibial pulses are full  and equal bilaterally with no bruits auscultated. Extremities & Back:  no deformities, clubbing, cyanosis, erythema or edema observed. Normal muscle strength and tone. Neurological:  no gross motor or sensory deficits noted, affect appropriate, oriented x3. ____________________________ MOST RECENT LIPID PANEL 09/26/10  CHOL TOTL 165 mg/dl, LDL 93 calc, HDL 46 mg/dl, TRIGLYCER 098 mg/dl and CHOL/HDL 3.6 (Calc) ____________________________ IMPRESSIONS/PLAN  1. Persistent atrial fibrillation 2. Nonischemic cardiomyopathy with residual depression of LV function 3. History of medical noncompliance 4. Ongoing tobacco abuse  Recommendations:  He is tolerating amiodarone reasonably well and has been on Pradaxa. I've recommended outpatient cardioversion. He also may need to go see the electrophysiologist but I would like to see how he does following cardioversion and also determine if he has any residual improvement in his LV function. The risk of cardioversion was discussed with the patient and he is agreeable to proceed.  ____________________________ TODAYS ORDERS  1. 12 Lead EKG: Today  2. Return Visit: 6 weeks  3. Comprehensive Metabolic Panel: Today  4. Complete Blood Count: Today  5. Chest X-ray PA/Lat: today                       ____________________________ Cardiology Physician:  Darden Palmer MD [facility]

## 2011-01-16 ENCOUNTER — Encounter (HOSPITAL_COMMUNITY): Payer: Self-pay

## 2011-01-16 ENCOUNTER — Other Ambulatory Visit: Payer: Self-pay | Admitting: Cardiology

## 2011-01-21 ENCOUNTER — Other Ambulatory Visit: Payer: Self-pay

## 2011-01-21 ENCOUNTER — Encounter (HOSPITAL_COMMUNITY): Payer: Self-pay | Admitting: Cardiology

## 2011-01-21 ENCOUNTER — Encounter (HOSPITAL_COMMUNITY)
Admission: RE | Disposition: A | Discharge status: Home / Self Care | Payer: Self-pay | Source: Ambulatory Visit | Attending: Cardiology

## 2011-01-21 ENCOUNTER — Ambulatory Visit (HOSPITAL_COMMUNITY)
Admission: RE | Admit: 2011-01-21 | Discharge: 2011-01-21 | Disposition: A | Discharge status: Home / Self Care | Payer: Medicaid Other | Source: Ambulatory Visit | Attending: Cardiology | Admitting: Cardiology

## 2011-01-21 ENCOUNTER — Ambulatory Visit (HOSPITAL_COMMUNITY): Payer: Medicaid Other | Admitting: Anesthesiology

## 2011-01-21 ENCOUNTER — Encounter (HOSPITAL_COMMUNITY): Payer: Self-pay | Admitting: Anesthesiology

## 2011-01-21 DIAGNOSIS — R569 Unspecified convulsions: Secondary | ICD-10-CM | POA: Insufficient documentation

## 2011-01-21 DIAGNOSIS — N318 Other neuromuscular dysfunction of bladder: Secondary | ICD-10-CM | POA: Insufficient documentation

## 2011-01-21 DIAGNOSIS — I2510 Atherosclerotic heart disease of native coronary artery without angina pectoris: Secondary | ICD-10-CM | POA: Insufficient documentation

## 2011-01-21 DIAGNOSIS — J449 Chronic obstructive pulmonary disease, unspecified: Secondary | ICD-10-CM | POA: Insufficient documentation

## 2011-01-21 DIAGNOSIS — Z01810 Encounter for preprocedural cardiovascular examination: Secondary | ICD-10-CM | POA: Insufficient documentation

## 2011-01-21 DIAGNOSIS — K08109 Complete loss of teeth, unspecified cause, unspecified class: Secondary | ICD-10-CM | POA: Insufficient documentation

## 2011-01-21 DIAGNOSIS — I509 Heart failure, unspecified: Secondary | ICD-10-CM | POA: Insufficient documentation

## 2011-01-21 DIAGNOSIS — I10 Essential (primary) hypertension: Secondary | ICD-10-CM | POA: Insufficient documentation

## 2011-01-21 DIAGNOSIS — I4891 Unspecified atrial fibrillation: Secondary | ICD-10-CM | POA: Insufficient documentation

## 2011-01-21 DIAGNOSIS — F1010 Alcohol abuse, uncomplicated: Secondary | ICD-10-CM | POA: Insufficient documentation

## 2011-01-21 DIAGNOSIS — J4489 Other specified chronic obstructive pulmonary disease: Secondary | ICD-10-CM | POA: Insufficient documentation

## 2011-01-21 HISTORY — PX: CARDIOVERSION: SHX1299

## 2011-01-21 SURGERY — CARDIOVERSION
Anesthesia: Monitor Anesthesia Care | Wound class: Clean

## 2011-01-21 MED ORDER — HYDROCORTISONE 1 % EX CREA
1.0000 "application " | TOPICAL_CREAM | Freq: Three times a day (TID) | CUTANEOUS | Status: DC | PRN
  Filled 2011-01-21: qty 28

## 2011-01-21 MED ORDER — PROPOFOL 10 MG/ML IV EMUL
INTRAVENOUS | Status: DC | PRN
  Administered 2011-01-21: 160 mg via INTRAVENOUS

## 2011-01-21 MED ORDER — SODIUM CHLORIDE 0.45 % IV SOLN: INTRAVENOUS | Status: DC

## 2011-01-21 MED ORDER — SODIUM CHLORIDE 0.9 % IV SOLN
INTRAVENOUS | Status: DC | PRN
  Administered 2011-01-21: 13:00:00 via INTRAVENOUS

## 2011-01-21 NOTE — Anesthesia Postprocedure Evaluation (Signed)
  Anesthesia Post-op Note  Patient: Johnny Rodriguez  Procedure(s) Performed:  CARDIOVERSION  Patient Location: Short Stay  Anesthesia Type: MAC and General  Level of Consciousness: awake, alert  and oriented  Airway and Oxygen Therapy: Patient Spontanous Breathing and Patient connected to nasal cannula oxygen  Post-op Pain: none  Post-op Assessment: Post-op Vital signs reviewed, Patient's Cardiovascular Status Stable, Respiratory Function Stable and No signs of Nausea or vomiting  Post-op Vital Signs: Reviewed and stable  Complications: No apparent anesthesia complications

## 2011-01-21 NOTE — H&P (Signed)
No change in history since seen. Stable for cardioversion.  Darden Palmer MD [facility]

## 2011-01-21 NOTE — Op Note (Signed)
Electrical Cardioversion Procedure Note Johnny Rodriguez [id number] [birth year]  Procedure: Electrical Cardioversion Indications:  Atrial Fibrillation  Time Out: Verified patient identification, verified procedure,medications/allergies/relevent history reviewed, required imaging and test results available.   Procedure Details  The patient was NPO after midnight. Anesthesia was administered at the beside  by [facility] with 160 mg of propofol.  Cardioversion was done with synchronized biphasic defibrillation with AP pads with 100 watts.  The patient converted to normal sinus rhythm. The patient tolerated the procedure well   IMPRESSION:  Successful cardioversion of atrial fibrillation    W. Viann Fish, Montez Hageman. MD [facility] 01/21/2011, 1:15 PM

## 2011-01-21 NOTE — Brief Op Note (Signed)
01/21/2011  1:14 PM  PATIENT:  Johnny Rodriguez  60 y.o. male  PRE-OPERATIVE DIAGNOSIS:  AFIB  POST-OPERATIVE DIAGNOSIS:  A fib PROCEDURE:  Procedure(s): CARDIOVERSION  See typed note.  Successful cardioversion  SURGEON:  Surgeon(s): Darden Palmer., MD

## 2011-01-21 NOTE — Transfer of Care (Signed)
Immediate Anesthesia Transfer of Care Note  Patient: Johnny Rodriguez  Procedure(s) Performed:  CARDIOVERSION  Patient Location: short stay   Anesthesia Type: MAC and General  Level of Consciousness: awake, alert  and oriented  Airway & Oxygen Therapy: Patient Spontanous Breathing and Patient connected to nasal cannula oxygen  Post-op Assessment: Report given to PACU RN and Post -op Vital signs reviewed and stable  Post vital signs: Reviewed and stable  Complications: No apparent anesthesia complications

## 2011-01-21 NOTE — Preoperative (Signed)
Beta Blockers   Reason not to administer Beta Blockers:Not Applicable, pt took home metoprolol

## 2011-01-21 NOTE — Anesthesia Procedure Notes (Signed)
Procedure Name: MAC Date/Time: 01/21/2011 1:10 PM Performed by: Rosita Fire Pre-anesthesia Checklist: Patient identified, Emergency Drugs available, Suction available and Patient being monitored Patient Re-evaluated:Patient Re-evaluated prior to inductionOxygen Delivery Method: Simple face mask Preoxygenation: Pre-oxygenation with 100% oxygen Intubation Type: IV induction Ventilation: Mask ventilation without difficulty

## 2011-01-21 NOTE — Anesthesia Preprocedure Evaluation (Addendum)
Anesthesia Evaluation  Patient identified by MRN, date of birth, ID band Patient awake    Reviewed: Allergy & Precautions, H&P , NPO status , Patient's Chart, lab work & pertinent test results, reviewed documented beta blocker date and time   Airway Mallampati: I TM Distance: >3 FB Neck ROM: Full    Dental  (+) Lower Dentures, Upper Dentures and Dental Advisory Given   Pulmonary COPD COPD inhaler,          Cardiovascular Exercise Tolerance: Good hypertension, Pt. on medications and Pt. on home beta blockers + CAD and +CHF + dysrhythmias Atrial Fibrillation Irregular Abnormal Afib not converted with amiodarone, on pradaxa EF 25% dilatied idiopathic cardiomyopathy. RCA occluded 50% normal LAD and circumflex   Neuro/Psych Seizures -, Well Controlled,  No meds, last seizure 3-5 years ago    GI/Hepatic negative GI ROS, (+)     substance abuse  alcohol use,   Endo/Other  Negative Endocrine ROS  Renal/GU negative Renal ROS Bladder dysfunction      Musculoskeletal   Abdominal   Peds  Hematology On pradaxa last dose 01/21/11   Anesthesia Other Findings   Reproductive/Obstetrics                      Anesthesia Physical Anesthesia Plan  ASA: III  Anesthesia Plan: MAC   Post-op Pain Management:    Induction: Intravenous  Airway Management Planned: Mask  Additional Equipment:   Intra-op Plan:   Post-operative Plan:   Informed Consent: I have reviewed the patients History and Physical, chart, labs and discussed the procedure including the risks, benefits and alternatives for the proposed anesthesia with the patient or authorized representative who has indicated his/her understanding and acceptance.     Plan Discussed with: CRNA and Anesthesiologist  Anesthesia Plan Comments:        Anesthesia Quick Evaluation

## 2011-01-22 ENCOUNTER — Encounter (HOSPITAL_COMMUNITY): Payer: Self-pay | Admitting: Cardiology

## 2011-02-11 HISTORY — PX: DOPPLER ECHOCARDIOGRAPHY: SHX263

## 2011-04-03 ENCOUNTER — Encounter: Payer: Self-pay | Admitting: Internal Medicine

## 2011-04-10 ENCOUNTER — Ambulatory Visit (INDEPENDENT_AMBULATORY_CARE_PROVIDER_SITE_OTHER): Payer: Medicaid Other | Admitting: Internal Medicine

## 2011-04-10 VITALS — BP 122/72 | HR 57 | Wt 162.8 lb

## 2011-04-10 DIAGNOSIS — I4891 Unspecified atrial fibrillation: Secondary | ICD-10-CM

## 2011-04-10 DIAGNOSIS — I5022 Chronic systolic (congestive) heart failure: Secondary | ICD-10-CM

## 2011-04-10 DIAGNOSIS — I5042 Chronic combined systolic (congestive) and diastolic (congestive) heart failure: Secondary | ICD-10-CM | POA: Insufficient documentation

## 2011-04-10 MED ORDER — LOSARTAN POTASSIUM 25 MG PO TABS: 25.0000 mg | ORAL_TABLET | Freq: Every day | ORAL | Status: DC

## 2011-04-10 MED ORDER — LISINOPRIL 5 MG PO TABS: 5.0000 mg | ORAL_TABLET | Freq: Every day | ORAL | Status: DC

## 2011-04-10 NOTE — Assessment & Plan Note (Signed)
The patient's symptoms are currently class II. He has had documented severe left ventricular dysfunction for 2 months. I've recommended that the patient have a repeat echo in 4-6 weeks. If his left ventricular dysfunction persists, then prophylactic ICD implantation for primary prevention of malignant ventricular arrhythmias would be warranted. If his ejection fraction has risen to above 35% while on maximal medical therapy, then prophylactic ICD implantation would be contraindicated. I discussed all the above with the patient and also discussed the risk, benefits, goals, and expectations, of ICD implantation. If his left ventricular dysfunction remains poor, we plan to proceed with ICD implant.

## 2011-04-10 NOTE — Assessment & Plan Note (Signed)
The patient is maintaining sinus rhythm on amiodarone. He will continue anticoagulation.

## 2011-04-10 NOTE — Progress Notes (Signed)
HPI [name] is referred today by Dr. Arlyn Leak for evaluation of chronic systolic heart failure and consideration for ICD implantation. The patient is a very pleasant 61 year old man with a history of cardiomyopathy for many years. Previously, he has had worsening left ventricular dysfunction followed by improved systolic function. There is a history of noncompliance. Approximately 2 months ago the patient presented with atrial fibrillation and a rapid ventricular response. He underwent 2-D echo which demonstrated severe left ventricular dysfunction and an ejection fraction of 15%. He was cardioverted successfully and begun on amiodarone. He appears to maintain sinus rhythm since then. The patient's most recent echo one month ago demonstrated improved but still depressed left ventricular systolic function with an ejection fraction of 30% by 2-D echo. He has class II heart failure symptoms. He has been on maximal medical therapy with a combination of a beta blocker, an ACE inhibitor, amiodarone, and a diuretic. He denies syncope. He currently denies alcohol or polysubstance abuse. No Known Allergies   Current Outpatient Prescriptions  Medication Sig Dispense Refill  . amiodarone (PACERONE) 200 MG tablet Take 200 mg by mouth daily.        Marland Kitchen aspirin EC 81 MG tablet Take 81 mg by mouth daily.        . dabigatran (PRADAXA) 150 MG CAPS Take 150 mg by mouth every 12 (twelve) hours.        . furosemide (LASIX) 40 MG tablet Take 40 mg by mouth daily.        . metoprolol (TOPROL-XL) 50 MG 24 hr tablet Take 50 mg by mouth daily.       Marland Kitchen tiotropium (SPIRIVA) 18 MCG inhalation capsule Place 18 mcg into inhaler and inhale daily.        Marland Kitchen lisinopril (PRINIVIL,ZESTRIL) 5 MG tablet Take 1 tablet (5 mg total) by mouth daily.  30 tablet  11  . DISCONTD: lisinopril (PRINIVIL,ZESTRIL) 5 MG tablet Take 5 mg by mouth daily.        Marland Kitchen DISCONTD: lisinopril (PRINIVIL,ZESTRIL) 5 MG tablet Take 1 tablet (5 mg total) by mouth daily.   30 tablet  11     Past Medical History  Diagnosis Date  . 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 CAD (coronary artery disease) 12/15/2004  . Seizures   . AF (atrial fibrillation)   . COPD (chronic obstructive pulmonary disease)   . Angina pectoris   . HTN (hypertension)   . Alcohol abuse     ROS:   All systems reviewed and negative except as noted in the HPI.   Past Surgical History  Procedure Date  . No past surgeries   . Cardioversion 01/21/2011    Procedure: CARDIOVERSION;  Surgeon: Darden Palmer., MD;  Location: [facility] OR;  Service: Cardiovascular;  Laterality: N/A;  . Fx collar bone   . Cardiac catheterization 12/2004    Left   . Doppler echocardiography 02/2011     Family History  Problem Relation Age of Onset  . Stroke Mother   . Coronary artery disease Father   . COPD Father      History   Social History  . Marital Status: Single    Spouse Name: N/A    Number of Children: N/A  . 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 Smoking status: Current Everyday Smoker -- 1.5 packs/day for 38 years  . Smokeless tobacco: Not on file  . Alcohol Use:   .  Drug Use:   . Sexually Active:    Other Topics Concern  . Not on file   Social History Narrative  . No narrative on file     BP 122/72  Pulse 57  Wt 73.846 kg (162 lb 12.8 oz)  Physical Exam:  Well appearing middle-aged man, NAD HEENT: Unremarkable Neck:  No JVD, no thyromegally Lungs:  Clear with no wheezes, rales, or rhonchi. HEART:  Regular rate rhythm, no murmurs, no rubs, no clicks. PMI is enlarged and laterally displaced. Abd:  soft, positive bowel sounds, no organomegally, no rebound, no guarding Ext:  2 plus pulses, no edema, no cyanosis, no clubbing Skin:  No rashes no nodules Neuro:  CN II through XII intact, motor grossly intact  EKG Sinus bradycardia with a QRS duration of 98 ms.  Assess/Plan:

## 2011-04-10 NOTE — Patient Instructions (Addendum)
Your physician recommends that you schedule a follow-up appointment in: April with Dr Taylor  

## 2011-04-11 ENCOUNTER — Other Ambulatory Visit: Payer: Self-pay | Admitting: Cardiology

## 2011-08-08 ENCOUNTER — Encounter: Payer: Self-pay | Admitting: Internal Medicine

## 2011-08-08 ENCOUNTER — Ambulatory Visit (INDEPENDENT_AMBULATORY_CARE_PROVIDER_SITE_OTHER): Payer: Medicaid Other | Admitting: Internal Medicine

## 2011-08-08 VITALS — BP 158/78 | HR 61 | Ht 71.0 in | Wt 165.4 lb

## 2011-08-08 DIAGNOSIS — I5022 Chronic systolic (congestive) heart failure: Secondary | ICD-10-CM

## 2011-08-09 ENCOUNTER — Encounter: Payer: Self-pay | Admitting: Internal Medicine

## 2011-08-09 NOTE — Assessment & Plan Note (Signed)
His ejection fraction has improved. His heart failure is class 1-2. I have recommended he continue his current medical therapy. He is not a candidate for ICD implant secondary to his improvement in LV function. I will see him back on an as needed basis and he will continue to followup with his primary cardiologist Dr. Donnie Aho. I have asked that he not drink ETOH.

## 2011-08-09 NOTE — Progress Notes (Signed)
HPI Mr. Collier returns today for followup. 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 The patient has done well since I last saw him 4 months ago. He is now on maximal medical therapy. I had him repeat a 2D echo several months ago and 3 months after being on maximal medical therapy and his ejection fraction was 40%. Since then he denies syncope and has had no atrial fib or hospitalization for CHF. No Known Allergies   Current Outpatient Prescriptions  Medication Sig Dispense Refill  . amiodarone (PACERONE) 200 MG tablet Take 200 mg by mouth daily.        Marland Kitchen aspirin EC 81 MG tablet Take 81 mg by mouth daily.        . dabigatran (PRADAXA) 150 MG CAPS Take 150 mg by mouth every 12 (twelve) hours.        . furosemide (LASIX) 40 MG tablet Take 40 mg by mouth daily.        Marland Kitchen lisinopril (PRINIVIL,ZESTRIL) 5 MG tablet Take 1 tablet (5 mg total) by mouth daily.  30 tablet  11  . losartan (COZAAR) 25 MG tablet Take 25 mg by mouth daily.      . metoprolol (TOPROL-XL) 50 MG 24 hr tablet Take 50 mg by mouth daily.       Marland Kitchen tiotropium (SPIRIVA) 18 MCG inhalation capsule Place 18 mcg into inhaler and inhale daily.           Past Medical History  Diagnosis Date  . 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 CAD (coronary artery disease) 12/15/2004  . Seizures   . AF (atrial fibrillation)   . COPD (chronic obstructive pulmonary disease)   . Angina pectoris   . HTN (hypertension)   . Alcohol abuse     ROS:   All systems reviewed and negative except as noted in the HPI.   Past Surgical History  Procedure Date  . No past surgeries   . Cardioversion 01/21/2011    Procedure: CARDIOVERSION;  Surgeon: Darden Palmer., MD;  Location: [facility] OR;  Service: Cardiovascular;  Laterality: N/A;  . Fx collar bone   . Cardiac catheterization 12/2004    Left   . Doppler echocardiography 02/2011     Family History    Problem Relation Age of Onset  . Stroke Mother   . Coronary artery disease Father   . COPD Father      History   Social History  . Marital Status: Single    Spouse Name: N/A    Number of Children: N/A  . 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 Smoking status: Current Everyday Smoker -- 1.5 packs/day for 38 years  . Smokeless tobacco: Not on file  . Alcohol Use:   . Drug Use:   . Sexually Active:    Other Topics Concern  . Not on file   Social History Narrative  . No narrative on file     BP 158/78  Pulse 61  Ht 5\' 11"  (1.803 m)  Wt 165 lb 6.4 oz (75.025 kg)  BMI 23.07 kg/m2  Physical Exam:  Diskempt but Well appearing middle age man, NAD HEENT: Unremarkable Neck:  No JVD, no thyromegally Lungs:  Clear with no wheezes, rales, or rhonchi HEART:  Regular rate rhythm, no murmurs, no rubs, no clicks  Abd:  soft, positive bowel sounds, no organomegally, no rebound, no guarding Ext:  2 plus pulses, no edema, no cyanosis, no clubbing Skin:  No rashes no nodules Neuro:  CN II through XII intact, motor grossly intact  ECG - sinus bradycardia with NSSTT changes. Assess/Plan:

## 2011-09-12 ENCOUNTER — Other Ambulatory Visit: Payer: Self-pay | Admitting: Cardiology

## 2011-12-01 ENCOUNTER — Ambulatory Visit
Admission: RE | Admit: 2011-12-01 | Discharge: 2011-12-01 | Disposition: A | Discharge status: Home / Self Care | Payer: Medicaid Other | Source: Ambulatory Visit | Attending: Cardiology | Admitting: Cardiology

## 2011-12-01 ENCOUNTER — Other Ambulatory Visit: Payer: Self-pay | Admitting: Cardiology

## 2011-12-01 ENCOUNTER — Ambulatory Visit: Payer: Medicaid Other

## 2011-12-01 DIAGNOSIS — I428 Other cardiomyopathies: Secondary | ICD-10-CM

## 2011-12-01 DIAGNOSIS — Z9119 Patient's noncompliance with other medical treatment and regimen: Secondary | ICD-10-CM | POA: Insufficient documentation

## 2011-12-01 DIAGNOSIS — R55 Syncope and collapse: Secondary | ICD-10-CM

## 2011-12-01 NOTE — Progress Notes (Signed)
Elias Else  Date of visit:  12/01/2011 DOB:  01-Feb-1951    Age:  61 yrs. Medical record number:  [id number]     Account number:  [id number] Primary Care Provider: Fransisco Hertz ____________________________ CURRENT DIAGNOSES  1. Cardiomyopathy Idiopathic  2. Congestive Heart Failure  3. Personal History Of Noncompliance With Medical Treatment Presenting Hazards To Health  4. Long Term Use Anticoagulant  5. Arrhythmia-Atrial Fibrillation  6. CAD,Native  7. COPD  8. Syncope ____________________________ ALLERGIES  NKDA ____________________________ MEDICATIONS  1. Spiriva with HandiHaler 18 mcg capsule, w/inhalation device, qd  2. metoprolol succinate 50 mg tablet extended release 24 hr, 1 p.o. daily  3. amiodarone 200 mg tablet, 1 p.o. daily  4. aspirin 81 mg tablet, chewable, 1 p.o. daily  5. losartan 25 mg tablet, 1 p.o. daily  6. spironolactone 25 mg tablet, 1 p.o. daily  7. furosemide 40 mg tablet, 1 p.o. daily ____________________________ CHIEF COMPLAINTS  Syncope fell hit head ____________________________ HISTORY OF PRESENT ILLNESS  Patient seen early for evaluation of syncope. He was in his usual state of health but somehow had stopped taking his Pradaxa anticoagulation a month ago. It is unclear whether or not he is taking his medications properly. He has not had significant shortness of breath and denies angina. He does have a significant cardiomyopathy but his last ejection fraction was around 35-40%. He went to church Sunday and did not feel well at church and left and at home developed a feeling of wooziness and unsteadiness while he was walking his dog and came in and had a syncopal episode falling and hitting his head as well as his right wrist. He did complain of a dull headache prior to the episode. He did not wish to go to the emergency room and he called the office this morning and was seen here this afternoon. He complained of feeling somewhat nauseated following the  episode. He denies angina and does not have PND orthopnea. It is unclear whether he is taking his furosemide once or twice a day. He did admit to drinking one beer yesterday but states that he has not been using alcohol to excess. ____________________________ PAST HISTORY  Past Medical Illnesses:  seizures, hypertension, history of alcohol abuse;  Cardiovascular Illnesses:  atrial fibrillation, cardiomyopathy(dilated);  Surgical Procedures:  fx collar bone;  NYHA Classification:  I;  Canadian Angina Classification:  Class 0: Asymptomatic;  Cardiology Procedures-Invasive:  cardiac cath (left) November 2006, cardioversion December 2012;  Cardiology Procedures-Noninvasive:  echocardiogram January 2013, echocardiogram May 2013;  Cardiac Cath Results:  50% stenosis RCA, normal Left main, no significant disease LAD, no significant disease CFX;  LVEF of 40% documented via echocardiogram on 06/16/2011  CHADS Score:  2  CHA2DS2-VASC Score:  3 ____________________________ CARDIO-PULMONARY TEST DATES EKG Date:  12/01/2011;   Cardiac Cath Date:  12/16/2004;  Echocardiography Date: 06/11/2011;  Chest Xray Date: 01/15/2011;   ____________________________ SOCIAL HISTORY Alcohol Use:  history of alcohol abuse, currently not drinking;  Smoking:  greater than 50 pack year history, smokes cigarettes, less than 1 ppd;  Diet:  regular diet and no added salt;  Lifestyle:  single;  Exercise:  some exercise and walking;  Occupation:  Holiday representative;  Residence:  lives alone;   ____________________________ REVIEW OF SYSTEMS General:  malaise and fatigue  Respiratory:  denies dyspnea, cough, wheezing or hemoptysis.  Cardiovascular:  please review HPI Abdominal:  denies dyspepsia, GI bleeding, constipation, or diarrhea  Musculoskeletal:  arthritis of the left knee  Neurological:  numbness in left hand  Psychiatric:  history of panic disorder, situational stress ____________________________ PHYSICAL EXAMINATION VITAL SIGNS  Blood  Pressure:  114/60 Sitting, Left arm, regular cuff  , 108/58 Standing, Left arm and regular cuff   Pulse:  64/min. Weight:  167.00 lbs. Height:  70"BMI: 24  Constitutional:  pleasant white male in no acute distress Skin:  warm and dry to touch, no apparent skin lesions, or masses noted. Head:  Scar on front of head Eyes:  EOMS Intact, PERRLA, C and S clear, Funduscopic exam not done. ENT:  ears, nose and throat reveal no gross abnormalities.  Dentition good. Neck:  supple, no masses, thyromegaly, JVD. Carotid pulses are full and equal bilaterally without bruits. Chest:  normal symmetry, clear to auscultation and percussion. Cardiac:  regular rhythm, normal S1 and S2, no S3 or S4 Abdomen:  abdomen soft,non-tender, no masses, no hepatospenomegaly, or aneurysm noted Peripheral Pulses:  the femoral,dorsalis pedis, and posterior tibial pulses are full and equal bilaterally with no bruits auscultated. Extremities & Back:  no deformities, clubbing, cyanosis, erythema or edema observed. Normal muscle strength and tone. Neurological:  no gross motor or sensory deficits noted, affect appropriate, oriented x3. ____________________________ MOST RECENT LIPID PANEL 12/01/11  CHOL TOTL 227 mg/dl, LDL 829 calc, HDL 53 mg/dl, TRIGLYCER 562 mg/dl and CHOL/HDL 4.3 (Calc) ____________________________ IMPRESSIONS/PLAN  1. Syncopal episode in a patient with known cardiomyopathy 2. History of atrial fibrillation 3. Medical noncompliance 4. Prior cardiomyopathy  Recommendations:  Obtain CT scan of head since he hit his head and also had a headache prior to the syncopal episode. Obtain chest x-ray metabolic panel TSH lipid panel and CBC today. Echocardiogram when first available. I think he needs to go back to see the electrophysiologist also. His EKG shows an IV conduction delay but no ischemic changes, he is in sinus rhythm with PVCs.   He is quite difficult to assess and does not currently have a primary  care doctor despite having Medicaid. He has no clue were to go and I suggested that he call the health department and Medicaid office to get a primary care doctor. ____________________________ TODAYS ORDERS  1. CHEST XRAY: Today  2. Comprehensive Metabolic Panel: Today  3. TSH: Today  4. Complete Blood Count: Today  5. Lipid Panel: Today  6. CT Head w/o Contrast: today  7. 2D, color flow, doppler: First Available  8.  Consult Dr. Ladona Ridgel EP Syncope: ASAP  9. 12 Lead EKG: Today                       ____________________________ Cardiology Physician:  Darden Palmer MD [facility]

## 2011-12-03 ENCOUNTER — Ambulatory Visit
Admission: RE | Admit: 2011-12-03 | Discharge: 2011-12-03 | Disposition: A | Discharge status: Home / Self Care | Payer: Medicaid Other | Source: Ambulatory Visit | Attending: Cardiology | Admitting: Cardiology

## 2011-12-03 DIAGNOSIS — R55 Syncope and collapse: Secondary | ICD-10-CM

## 2011-12-10 ENCOUNTER — Ambulatory Visit (INDEPENDENT_AMBULATORY_CARE_PROVIDER_SITE_OTHER): Payer: Medicaid Other | Admitting: Internal Medicine

## 2011-12-10 ENCOUNTER — Encounter: Payer: Self-pay | Admitting: *Deleted

## 2011-12-10 ENCOUNTER — Encounter: Payer: Self-pay | Admitting: Internal Medicine

## 2011-12-10 VITALS — BP 137/87 | HR 78 | Ht 71.0 in | Wt 167.8 lb

## 2011-12-10 DIAGNOSIS — R55 Syncope and collapse: Secondary | ICD-10-CM | POA: Insufficient documentation

## 2011-12-10 DIAGNOSIS — I5022 Chronic systolic (congestive) heart failure: Secondary | ICD-10-CM

## 2011-12-10 LAB — CBC WITH DIFFERENTIAL/PLATELET
Basophils Absolute: 0 10*3/uL (ref 0.0–0.1)
Basophils Relative: 0.4 % (ref 0.0–3.0)
Eosinophils Absolute: 0.1 10*3/uL (ref 0.0–0.7)
Eosinophils Relative: 1.8 % (ref 0.0–5.0)
HCT: 45.6 % (ref 39.0–52.0)
Hemoglobin: 15 g/dL (ref 13.0–17.0)
Lymphocytes Relative: 26.2 % (ref 12.0–46.0)
Lymphs Abs: 1.3 10*3/uL (ref 0.7–4.0)
MCHC: 33 g/dL (ref 30.0–36.0)
MCV: 95.4 fl (ref 78.0–100.0)
Monocytes Absolute: 0.4 10*3/uL (ref 0.1–1.0)
Monocytes Relative: 7.5 % (ref 3.0–12.0)
Neutro Abs: 3.2 10*3/uL (ref 1.4–7.7)
Neutrophils Relative %: 64.1 % (ref 43.0–77.0)
Platelets: 253 10*3/uL (ref 150.0–400.0)
RBC: 4.78 Mil/uL (ref 4.22–5.81)
RDW: 13.6 % (ref 11.5–14.6)
WBC: 5 10*3/uL (ref 4.5–10.5)

## 2011-12-10 LAB — BASIC METABOLIC PANEL
BUN: 19 mg/dL (ref 6–23)
CO2: 30 mEq/L (ref 19–32)
Calcium: 9.3 mg/dL (ref 8.4–10.5)
Chloride: 100 mEq/L (ref 96–112)
Creatinine, Ser: 1.2 mg/dL (ref 0.4–1.5)
GFR: 65.71 mL/min (ref >60.00)
Glucose, Bld: 101 mg/dL — ABNORMAL HIGH (ref 70–99)
Potassium: 4.4 mEq/L (ref 3.5–5.1)
Sodium: 137 mEq/L (ref 135–145)

## 2011-12-10 LAB — PROTIME-INR
INR: 1.3 ratio — ABNORMAL HIGH (ref 0.8–1.0)
Prothrombin Time: 13.4 s — ABNORMAL HIGH (ref 10.2–12.4)

## 2011-12-10 LAB — APTT: aPTT: 51 s — ABNORMAL HIGH (ref 21.7–28.8)

## 2011-12-10 NOTE — Assessment & Plan Note (Signed)
His current symptoms are class II. No change in medical therapy. He will maintain a low-sodium diet.

## 2011-12-10 NOTE — Progress Notes (Signed)
HPI Her Hinchliffe returns today for followup. He is a very pleasant 61 year old man with an ischemic cardiomyopathy, and chronic systolic heart failure, and atrial fibrillation. The patient was initially referred by Dr. Donnie Aho. He initially had fairly marked left ventricular dysfunction. After restoration of sinus rhythm, and maximization of his heart failure medications, his ejection fraction improved to 40%. At that time it was my impression that ICD implantation was not indicated, and that he would followup as needed. In the interim, he has had an episode of sudden syncope. There is no warning. He was out only a few seconds. When he awoke he felt fine. No loss of bowel or bladder continence. No tongue biting. He denies alcohol abuse or substance abuse.  No Known Allergies   Current Outpatient Prescriptions  Medication Sig Dispense Refill  . amiodarone (PACERONE) 200 MG tablet Take 200 mg by mouth daily.        Marland Kitchen aspirin EC 81 MG tablet Take 81 mg by mouth daily.        . dabigatran (PRADAXA) 150 MG CAPS Take 150 mg by mouth every 12 (twelve) hours.        . furosemide (LASIX) 40 MG tablet Take 40 mg by mouth daily.        Marland Kitchen losartan (COZAAR) 25 MG tablet Take 25 mg by mouth daily.      . metoprolol (TOPROL-XL) 50 MG 24 hr tablet Take 50 mg by mouth daily.       Marland Kitchen spironolactone (ALDACTONE) 25 MG tablet Take 25 mg by mouth daily.      Marland Kitchen tiotropium (SPIRIVA) 18 MCG inhalation capsule Place 18 mcg into inhaler and inhale daily.           Past Medical History  Diagnosis Date  . 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 CAD (coronary artery disease) 12/15/2004  . Seizures   . AF (atrial fibrillation)   . COPD (chronic obstructive pulmonary disease)   . Angina pectoris   . HTN (hypertension)   . Alcohol abuse     ROS:   All systems reviewed and negative except as noted in the HPI.   Past Surgical History  Procedure Date  . No past surgeries   . Cardioversion 01/21/2011    Procedure:  CARDIOVERSION;  Surgeon: Darden Palmer., MD;  Location: [facility] OR;  Service: Cardiovascular;  Laterality: N/A;  . Fx collar bone   . Cardiac catheterization 12/2004    Left   . Doppler echocardiography 02/2011     Family History  Problem Relation Age of Onset  . Stroke Mother   . Coronary artery disease Father   . COPD Father      History   Social History  . Marital Status: Single    Spouse Name: N/A    Number of Children: N/A  . 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 Smoking status: Current Every Day Smoker -- 1.5 packs/day for 38 years  . Smokeless tobacco: Not on file  . Alcohol Use:   . Drug Use:   . Sexually Active:    Other Topics Concern  . Not on file   Social History Narrative  . No narrative on file     BP 137/87  Pulse 78  Ht 5\' 11"  (1.803 m)  Wt 167 lb 12.8 oz (76.114 kg)  BMI 23.40 kg/m2  SpO2 98%  Physical Exam:  Well appearing  middle-aged  man, NAD HEENT: Unremarkable Neck:  No JVD, no thyromegally Lungs:  Clear with no wheezes, rales, or rhonchi.  HEART:  Regular rate rhythm, no murmurs, no rubs, no clicks Abd:  soft, positive bowel sounds, no organomegally, no rebound, no guarding Ext:  2 plus pulses, no edema, no cyanosis, no clubbing Skin:  No rashes no nodules Neuro:  CN II through XII intact, motor grossly intact  EKG  normal sinus rhythm with sinus bradycardia and PVCs.  Assess/Plan:

## 2011-12-10 NOTE — Assessment & Plan Note (Signed)
The etiology of the patient's symptoms is unclear. I am concerned about the possibility of a malignant ventricular arrhythmia. To evaluate this further, I have recommended electrophysiologic study. If this study is positive, then I would recommend insertion of an implantable defibrillator. If negative, implantable loop recorder would be recommended.

## 2011-12-15 MED ORDER — CEFAZOLIN SODIUM-DEXTROSE 2-3 GM-% IV SOLR
2.0000 g | INTRAVENOUS | Status: DC
  Filled 2011-12-15: qty 50

## 2011-12-15 MED ORDER — SODIUM CHLORIDE 0.9 % IR SOLN
80.0000 mg | Status: DC
  Filled 2011-12-15: qty 2

## 2011-12-16 ENCOUNTER — Ambulatory Visit (HOSPITAL_COMMUNITY)
Admission: RE | Admit: 2011-12-16 | Discharge: 2011-12-17 | Disposition: A | Discharge status: Home / Self Care | Payer: Medicaid Other | Source: Ambulatory Visit | Attending: Internal Medicine | Admitting: Internal Medicine

## 2011-12-16 ENCOUNTER — Encounter (HOSPITAL_COMMUNITY)
Admission: RE | Disposition: A | Discharge status: Home / Self Care | Payer: Self-pay | Source: Ambulatory Visit | Attending: Internal Medicine

## 2011-12-16 ENCOUNTER — Encounter (HOSPITAL_COMMUNITY): Payer: Self-pay | Admitting: Urology

## 2011-12-16 DIAGNOSIS — J4489 Other specified chronic obstructive pulmonary disease: Secondary | ICD-10-CM | POA: Insufficient documentation

## 2011-12-16 DIAGNOSIS — I5022 Chronic systolic (congestive) heart failure: Secondary | ICD-10-CM | POA: Insufficient documentation

## 2011-12-16 DIAGNOSIS — I10 Essential (primary) hypertension: Secondary | ICD-10-CM | POA: Insufficient documentation

## 2011-12-16 DIAGNOSIS — I209 Angina pectoris, unspecified: Secondary | ICD-10-CM | POA: Insufficient documentation

## 2011-12-16 DIAGNOSIS — R55 Syncope and collapse: Secondary | ICD-10-CM | POA: Insufficient documentation

## 2011-12-16 DIAGNOSIS — J449 Chronic obstructive pulmonary disease, unspecified: Secondary | ICD-10-CM | POA: Insufficient documentation

## 2011-12-16 DIAGNOSIS — I2510 Atherosclerotic heart disease of native coronary artery without angina pectoris: Secondary | ICD-10-CM | POA: Insufficient documentation

## 2011-12-16 DIAGNOSIS — I471 Supraventricular tachycardia: Secondary | ICD-10-CM

## 2011-12-16 DIAGNOSIS — I2589 Other forms of chronic ischemic heart disease: Secondary | ICD-10-CM | POA: Insufficient documentation

## 2011-12-16 HISTORY — PX: ELECTROPHYSIOLOGY STUDY: SHX5467

## 2011-12-16 LAB — SURGICAL PCR SCREEN
MRSA, PCR: NEGATIVE
Staphylococcus aureus: NEGATIVE

## 2011-12-16 SURGERY — ELECTROPHYSIOLOGY STUDY
Anesthesia: LOCAL

## 2011-12-16 MED ORDER — SODIUM CHLORIDE 0.45 % IV SOLN
INTRAVENOUS | Status: DC
  Administered 2011-12-16: 14:00:00 via INTRAVENOUS

## 2011-12-16 MED ORDER — SPIRONOLACTONE 25 MG PO TABS
25.0000 mg | ORAL_TABLET | Freq: Every day | ORAL | Status: DC
  Administered 2011-12-17: 25 mg via ORAL
  Filled 2011-12-16: qty 1

## 2011-12-16 MED ORDER — SODIUM CHLORIDE 0.9 % IV SOLN
250.0000 mL | INTRAVENOUS | Status: DC
  Administered 2011-12-16: 1000 mL via INTRAVENOUS

## 2011-12-16 MED ORDER — BUPIVACAINE HCL (PF) 0.25 % IJ SOLN
INTRAMUSCULAR | Status: AC
  Filled 2011-12-16: qty 30

## 2011-12-16 MED ORDER — ASPIRIN EC 81 MG PO TBEC
81.0000 mg | DELAYED_RELEASE_TABLET | Freq: Every day | ORAL | Status: DC
  Administered 2011-12-16 – 2011-12-17 (×2): 81 mg via ORAL
  Filled 2011-12-16 (×2): qty 1

## 2011-12-16 MED ORDER — CEFAZOLIN SODIUM-DEXTROSE 2-3 GM-% IV SOLR
2.0000 g | Freq: Once | INTRAVENOUS | Status: DC
  Filled 2011-12-16: qty 50

## 2011-12-16 MED ORDER — CEFAZOLIN SODIUM-DEXTROSE 2-3 GM-% IV SOLR
2.0000 g | Freq: Four times a day (QID) | INTRAVENOUS | Status: DC
  Administered 2011-12-16 – 2011-12-17 (×2): 2 g via INTRAVENOUS
  Filled 2011-12-16 (×3): qty 50

## 2011-12-16 MED ORDER — MUPIROCIN 2 % EX OINT
TOPICAL_OINTMENT | CUTANEOUS | Status: AC
  Filled 2011-12-16: qty 22

## 2011-12-16 MED ORDER — DABIGATRAN ETEXILATE MESYLATE 150 MG PO CAPS
150.0000 mg | ORAL_CAPSULE | Freq: Two times a day (BID) | ORAL | Status: DC
  Administered 2011-12-16 – 2011-12-17 (×2): 150 mg via ORAL
  Filled 2011-12-16 (×3): qty 1

## 2011-12-16 MED ORDER — BUPIVACAINE HCL (PF) 0.25 % IJ SOLN
INTRAMUSCULAR | Status: AC
  Filled 2011-12-16: qty 60

## 2011-12-16 MED ORDER — LIDOCAINE HCL (PF) 1 % IJ SOLN
INTRAMUSCULAR | Status: AC
  Filled 2011-12-16: qty 60

## 2011-12-16 MED ORDER — MIDAZOLAM HCL 5 MG/5ML IJ SOLN
INTRAMUSCULAR | Status: AC
  Filled 2011-12-16: qty 5

## 2011-12-16 MED ORDER — CHLORHEXIDINE GLUCONATE 4 % EX LIQD
60.0000 mL | Freq: Once | CUTANEOUS | Status: AC
  Administered 2011-12-16: 4 via TOPICAL
  Filled 2011-12-16: qty 60

## 2011-12-16 MED ORDER — SODIUM CHLORIDE 0.9 % IJ SOLN: 3.0000 mL | Freq: Two times a day (BID) | INTRAMUSCULAR | Status: DC

## 2011-12-16 MED ORDER — METOPROLOL SUCCINATE ER 50 MG PO TB24
50.0000 mg | ORAL_TABLET | Freq: Every day | ORAL | Status: DC
  Administered 2011-12-17: 50 mg via ORAL
  Filled 2011-12-16: qty 1

## 2011-12-16 MED ORDER — FUROSEMIDE 40 MG PO TABS
40.0000 mg | ORAL_TABLET | Freq: Every day | ORAL | Status: DC
  Filled 2011-12-16: qty 1

## 2011-12-16 MED ORDER — ACETAMINOPHEN 325 MG PO TABS: 325.0000 mg | ORAL_TABLET | ORAL | Status: DC | PRN

## 2011-12-16 MED ORDER — ONDANSETRON HCL 4 MG/2ML IJ SOLN: 4.0000 mg | Freq: Four times a day (QID) | INTRAMUSCULAR | Status: DC | PRN

## 2011-12-16 MED ORDER — LOSARTAN POTASSIUM 25 MG PO TABS
25.0000 mg | ORAL_TABLET | Freq: Every day | ORAL | Status: DC
  Administered 2011-12-17: 25 mg via ORAL
  Filled 2011-12-16: qty 1

## 2011-12-16 MED ORDER — TIOTROPIUM BROMIDE MONOHYDRATE 18 MCG IN CAPS
18.0000 ug | ORAL_CAPSULE | Freq: Every day | RESPIRATORY_TRACT | Status: DC
  Administered 2011-12-17: 18 ug via RESPIRATORY_TRACT
  Filled 2011-12-16: qty 5

## 2011-12-16 MED ORDER — SODIUM CHLORIDE 0.9 % IJ SOLN: 3.0000 mL | INTRAMUSCULAR | Status: DC | PRN

## 2011-12-16 MED ORDER — MUPIROCIN 2 % EX OINT
TOPICAL_OINTMENT | Freq: Two times a day (BID) | CUTANEOUS | Status: DC
  Administered 2011-12-16: 14:00:00 via NASAL
  Filled 2011-12-16: qty 22

## 2011-12-16 MED ORDER — FENTANYL CITRATE 0.05 MG/ML IJ SOLN
INTRAMUSCULAR | Status: AC
  Filled 2011-12-16: qty 2

## 2011-12-16 MED ORDER — AMIODARONE HCL 200 MG PO TABS
200.0000 mg | ORAL_TABLET | Freq: Every day | ORAL | Status: DC
  Administered 2011-12-17: 200 mg via ORAL
  Filled 2011-12-16: qty 1

## 2011-12-16 NOTE — H&P (View-Only) (Signed)
HPI Johnny Rodriguez Rodriguez returns today for followup. Johnny Rodriguez is a very pleasant 61-year-old man with an ischemic cardiomyopathy, and chronic systolic heart failure, and atrial fibrillation. The patient was initially referred by Dr. Tilley. Johnny Rodriguez initially had fairly marked left ventricular dysfunction. After restoration of sinus rhythm, and maximization of his heart failure medications, his ejection fraction improved to 40%. At that time it was my impression that ICD implantation was not indicated, and that Johnny Rodriguez would followup as needed. In the interim, Johnny Rodriguez has had an episode of sudden syncope. There is no warning. Johnny Rodriguez was out only a few seconds. When Johnny Rodriguez awoke Johnny Rodriguez felt fine. No loss of bowel or bladder continence. No tongue biting. Johnny Rodriguez denies alcohol abuse or substance abuse.  No Known Allergies   Current Outpatient Prescriptions  Medication Sig Dispense Refill  . amiodarone (PACERONE) 200 MG tablet Take 200 mg by mouth daily.        . aspirin EC 81 MG tablet Take 81 mg by mouth daily.        . dabigatran (PRADAXA) 150 MG CAPS Take 150 mg by mouth every 12 (twelve) hours.        . furosemide (LASIX) 40 MG tablet Take 40 mg by mouth daily.        . losartan (COZAAR) 25 MG tablet Take 25 mg by mouth daily.      . metoprolol (TOPROL-XL) 50 MG 24 hr tablet Take 50 mg by mouth daily.       . spironolactone (ALDACTONE) 25 MG tablet Take 25 mg by mouth daily.      . tiotropium (SPIRIVA) 18 MCG inhalation capsule Place 18 mcg into inhaler and inhale daily.           Past Medical History  Diagnosis Date  . 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 CAD (coronary artery disease) 12/15/2004  . Seizures   . AF (atrial fibrillation)   . COPD (chronic obstructive pulmonary disease)   . Angina pectoris   . HTN (hypertension)   . Alcohol abuse     ROS:   All systems reviewed and negative except as noted in the HPI.   Past Surgical History  Procedure Date  . No past surgeries   . Cardioversion 01/21/2011    Procedure:  CARDIOVERSION;  Surgeon: W Spencer Tilley Jr., MD;  Location: [facility] OR;  Service: Cardiovascular;  Laterality: N/A;  . Fx collar bone   . Cardiac catheterization 12/2004    Left   . Doppler echocardiography 02/2011     Family History  Problem Relation Age of Onset  . Stroke Mother   . Coronary artery disease Father   . COPD Father      History   Social History  . Marital Status: Single    Spouse Name: N/A    Number of Children: N/A  . Years of Education: N/A   Occupational History  . contruction worker    Social History Main Topics  . Smoking status: Current Every Day Smoker -- 1.5 packs/day for 38 years  . Smokeless tobacco: Not on file  . Alcohol Use:   . Drug Use:   . Sexually Active:    Other Topics Concern  . Not on file   Social History Narrative  . No narrative on file     BP 137/87  Pulse 78  Ht 5' 11" (1.803 m)  Wt 167 lb 12.8 oz (76.114 kg)  BMI 23.40 kg/m2  SpO2 98%  Physical Exam:  Well appearing  middle-aged   man, NAD HEENT: Unremarkable Neck:  No JVD, no thyromegally Lungs:  Clear with no wheezes, rales, or rhonchi.  HEART:  Regular rate rhythm, no murmurs, no rubs, no clicks Abd:  soft, positive bowel sounds, no organomegally, no rebound, no guarding Ext:  2 plus pulses, no edema, no cyanosis, no clubbing Skin:  No rashes no nodules Neuro:  CN II through XII intact, motor grossly intact  EKG  normal sinus rhythm with sinus bradycardia and PVCs.  Assess/Plan:  

## 2011-12-16 NOTE — Interval H&P Note (Signed)
History and Physical Interval Note:  12/16/2011 4:29 PM  Johnny Rodriguez  has presented today for surgery, with the diagnosis of syncope  The various methods of treatment have been discussed with the patient and family. After consideration of risks, benefits and other options for treatment, the patient has consented to  Procedure(s) (LRB) with comments: ELECTROPHYSIOLOGY STUDY (N/A) as a surgical intervention .  The patient's history has been reviewed, patient examined, no change in status, stable for surgery.  I have reviewed the patient's chart and labs.  Questions were answered to the patient's satisfaction.     Leonia Reeves.D.

## 2011-12-16 NOTE — Op Note (Signed)
Invasive EPS/insertion of an ILR without immediate complication. W#[id number].

## 2011-12-17 NOTE — Discharge Summary (Signed)
ELECTROPHYSIOLOGY PROCEDURE DISCHARGE SUMMARY    Patient ID: Johnny Rodriguez,  MRN: [id number], DOB/AGE: Jun 22, 1950 61 y.o.  Admit date: 12/16/2011 Discharge date: 01/16/2012  Primary Cardiologist: Viann Fish, MD Electrophysiologist: Lewayne Bunting, MD  Primary Discharge Diagnosis:  Syncope s/p negative EPS and ILR implantation this admission  Secondary Discharge Diagnosis:  1.  Atrial fibrillation s/p DCCV, anti-coagulated with Pradaxa 2.  Non-ischemic cardiomyopathy- EF of 40% after restoration of SR and optimization of medications 3.  History of non-compliance 4.  Tobacco use  Procedures This Admission:  1.  Electrophysiology study and implantation of an implantable loop recorder on 12-16-2011 by Dr Ladona Ridgel.  EPS demonstrated no inducible arrythmia.  The patient was implanted with a St Jude Confirm ILR.  Brief HPI: Johnny Rodriguez is a very pleasant 61 year old man with an ischemic cardiomyopathy, and chronic systolic heart failure, and atrial fibrillation. The patient was initially referred by Dr. Donnie Aho. He initially had fairly marked left ventricular dysfunction. After restoration of sinus rhythm, and maximization of his heart failure medications, his ejection fraction improved to 40%. At that time it was my impression that ICD implantation was not indicated, and that he would followup as needed. In the interim, he has had an episode of sudden syncope. There is no warning. He was out only a few seconds. When he awoke he felt fine. No loss of bowel or bladder continence. No tongue biting. He denies alcohol abuse or substance abuse.  EPS +/- ICD or ILR was recommended.  Risks, benefits, and alternatives were reviewed with the patient who wished to proceed.   Hospital Course:  The patient was admitted and underwent electrophysiology study on 12-16-2011.  This demonstrated no inducible arrhythmias and therefore, ILR implantation was carried out.  The patient received a STJ Confirm ILR.  He was  monitored on telemetry overnight which demonstrated SR/SB.  His groin was without hematoma or bruit.  Dr Ladona Ridgel examined the patient on 12-17-2011 and considered him stable for discharge to home.   Discharge Vitals: Blood pressure 130/94, pulse 50, temperature 98.3 F (36.8 C), temperature source Oral, resp. rate 18, height 5\' 11"  (1.803 m), weight 166 lb 0.1 oz (75.3 kg), SpO2 96.00%.   Labs:   Lab Results  Component Value Date   WBC 5.0 12/10/2011   HGB 15.0 12/10/2011   HCT 45.6 12/10/2011   MCV 95.4 12/10/2011   PLT 253.0 12/10/2011    No results found for this basename: NA,K,CL,CO2,BUN,CREATININE,CALCIUM,LABALBU,PROT,BILITOT,ALKPHOS,ALT,AST,GLUCOSE in the last 168 hours  Discharge Medications:    Medication List     As of 01/16/2012  7:24 AM    TAKE these medications         amiodarone 200 MG tablet   Commonly known as: PACERONE   Take 200 mg by mouth daily.      aspirin EC 81 MG tablet   Take 81 mg by mouth daily.      dabigatran 150 MG Caps   Commonly known as: PRADAXA   Take 150 mg by mouth every 12 (twelve) hours.      furosemide 40 MG tablet   Commonly known as: LASIX   Take 40 mg by mouth daily.      losartan 25 MG tablet   Commonly known as: COZAAR   Take 25 mg by mouth daily.      metoprolol succinate 50 MG 24 hr tablet   Commonly known as: TOPROL-XL   Take 50 mg by mouth daily.      spironolactone  25 MG tablet   Commonly known as: ALDACTONE   Take 25 mg by mouth daily.      tiotropium 18 MCG inhalation capsule   Commonly known as: SPIRIVA   Place 18 mcg into inhaler and inhale daily.          Disposition:  Discharge Orders    Future Appointments: Provider: Department: Dept Phone: Center:   03/23/2012 2:30 PM Marinus Maw, MD [facility] Main [facility] [place]) [phone] [id number]     Future Orders Please Complete By Expires   Diet - low sodium heart healthy      Increase activity slowly        Follow-up Information     Follow up with [facility] On 12/25/2011. (At 11:30 AM)    Contact information:   [address] [place] [address] [phone]        Follow up with Lewayne Bunting, MD. On 03/23/2012. (At 2:30 PM)    Contact information:   [address] [place] [address] [phone]          Duration of Discharge Encounter: Greater than 30 minutes including physician time.  Signed, Gypsy Balsam, RN, BSN 01/16/2012, 7:24 AM  EP Attending  Patient seen and examined. Agree with above.  Leonia Reeves.D.

## 2011-12-17 NOTE — Progress Notes (Signed)
   ELECTROPHYSIOLOGY ROUNDING NOTE    Patient Name: Johnny Rodriguez Date of Encounter: 12-17-2011    SUBJECTIVE:Patient feels well.  No chest pain or shortness of breath.  S/p EPS and ILR implantation 12-16-2011  TELEMETRY: Reviewed telemetry pt in sinus rhythm/sinus brady Filed Vitals:   12/16/11 2300 12/16/11 2327 12/17/11 0107 12/17/11 0643  BP: 122/73  140/78 130/93  Pulse: 52 54 54 64  Temp:    98.3 F (36.8 C)  TempSrc:    Oral  Resp:    18  Height:      Weight:    166 lb 0.1 oz (75.3 kg)  SpO2:   98% 96%    Intake/Output Summary (Last 24 hours) at 12/17/11 0655 Last data filed at 12/17/11 0533  Gross per 24 hour  Intake    530 ml  Output    650 ml  Net   -120 ml     PHYSICAL EXAM Left chest without hematoma or ecchymosis  Wound care, restrictions reviewed with patient.  Routine follow up scheduled.   eP Attending  Patient seen and examined. Agree with above. Will discharge home with watchful waiting.  Johnny Rodriguez.D.

## 2011-12-17 NOTE — Progress Notes (Signed)
Monitor tech notified RN that pt HR sustaining in 40s. The lowest the pt HR went was 39bpm. Pt was asymptomatic, pt was asleep. When pt woke up, HR went back to 50s-60s. BP 140/78. MD notified and no new orders given. Will continue to monitor and assess. Jobe Igo A RN 12/17/2011 1:11 AM

## 2011-12-17 NOTE — Progress Notes (Signed)
LATE ENTRY:   Pt arrived to unit from cath lab.  Pt vitals have been taken and are BP138/96, Pulse 51, Pulse ox 97% on RA.  Pt placed on telemetry and currently Sinus Huston Foley.  Pt has been oriented to the unit and to the room.  Pt R groin a level 0 at this time with dressing clean, dry, and intact.  Pt R pedal pulse is palpable.  L upper chest dressing has slight staining but remains intact.  Pt resting comfortably in bed and appears in no acute distress.  Will continue to monitor. Nino Glow RN

## 2011-12-17 NOTE — Op Note (Signed)
NAMEKEYLER, HOGE NO.:  [id number]  MEDICAL RECORD NO.:  [id number]  LOCATION:  4702                         FACILITY:  [facility]  PHYSICIAN:  Doylene Canning. Ladona Ridgel, MD    DATE OF BIRTH:  1950-08-06  DATE OF PROCEDURE:  12/16/2011 DATE OF DISCHARGE:  12/17/2011                              OPERATIVE REPORT   PROCEDURE PERFORMED:  Invasive electrophysiologic study followed by insertion of an implantable loop recorder.  INTRODUCTION:  The patient is a very pleasant, middle-aged man with a history of syncope and left ventricular dysfunction.  He has an ischemic cardiomyopathy and chronic systolic heart failure.  He has a history of paroxysmal atrial fibrillation.  The etiology of the syncope is unclear. The concern is that with his left ventricular dysfunction, he had an malignant ventricular arrhythmia.  He is now referred for invasive electrophysiologic study.  If this study is negative, insertion of an implantable loop recorder and if he has inducible VT or VF, insertion of an ICD.  PROCEDURE:  After informed consent was obtained, the patient was taken to the diagnostic EP lab in a fasting state.  After usual preparation and draping, intravenous fentanyl and midazolam was given for sedation. A 6-French quadripolar catheter was inserted percutaneously in the right femoral vein and advanced to the right ventricle.  A 6-French quadripolar catheter was inserted percutaneously in the right femoral vein and advanced to His bundle region.  After measuring the basic intervals, rapid ventricular pacing was then carried out from the right ventricle demonstrating VA Wenckebach cycle length at 480 msec.  Rather additional rapid ventricular pacing was carried out down to 280 msec, but there was no inducible VT.  There was no inducible SVT.  Programmed ventricular stimulation was then carried out from the right ventricle at base drive cycle length of 161 msec.  S1-S2, S1, S2, S3,  and S1, S2, S3, S4 stimuli were delivered with the S1-S2, S2-S3, and S3-S4 intervals stepwise decreased down to ventricular fractions.  During programmed ventricular stimulation, there was no inducible VT, and no inducible SVT.  Rapid atrial pacing was then carried out demonstrating AV Wenckebach cycle length of 400 msec.  During rapid ventricular pacing, there was no inducible SVT.  __________ stimulation was carried out from the atrium at a base drive cycle length of 096 msec and stepwise decreased down to __________ at 308 msec.  There was no inducible SVT. Catheter was then removed and the patient was referred for insertion of implantable loop recorder.  After the patient was more deeply sedated with fentanyl and Versed, 30 mL of lidocaine was infiltrated after the usual preparation and draping over the left pectoral region.  A 3-cm incision was carried out over this region and electrocautery was utilized to dissect down to the fascial plane.  A subcutaneous pocket was then fashioned with electrocautery.  Electrocautery was utilized to assure hemostasis. Antibiotic irrigation was utilized to irrigate the pocket.  The St. Jude implantable loop recorder, serial [id number], was placed in the subcutaneous pocket and secured with silk suture.  The pocket was again irrigated with antibiotic irrigation.  The incision was closed with 2-0 and 3-0 Vicryl.  Benzoin and Steri-Strips painted on the skin.  A pressure dressing applied, and the patient was returned to his room in satisfactory condition.  COMPLICATIONS:  There were no immediate procedure complications.  RESULTS:  This demonstrates successful insertion of a St. Jude implantable loop recorder in the patient with unexplained syncope, an ischemic cardiomyopathy, EF 40%, and negative electrophysiologic study.     Doylene Canning. Ladona Ridgel, MD     GWT/MEDQ  D:  12/16/2011  T:  12/17/2011  Job:  [id number]  cc:   Dr. Viann Fish

## 2011-12-17 NOTE — Progress Notes (Signed)
Utilization Review Completed.  

## 2011-12-19 ENCOUNTER — Telehealth: Payer: Self-pay | Admitting: Internal Medicine

## 2011-12-19 NOTE — Telephone Encounter (Signed)
Spoke with patient and explained how to make snapshot recording of his ILR.  Patient voiced understanding.  Follow up as scheduled.

## 2011-12-19 NOTE — Telephone Encounter (Signed)
plz return call to pt [phone] regarding instructions and questions about remote transmission.

## 2011-12-25 ENCOUNTER — Ambulatory Visit: Payer: Medicaid Other

## 2011-12-29 ENCOUNTER — Encounter: Payer: Self-pay | Admitting: *Deleted

## 2011-12-29 ENCOUNTER — Ambulatory Visit (INDEPENDENT_AMBULATORY_CARE_PROVIDER_SITE_OTHER): Payer: Medicaid Other | Admitting: *Deleted

## 2011-12-29 ENCOUNTER — Encounter: Payer: Self-pay | Admitting: Internal Medicine

## 2011-12-29 DIAGNOSIS — R55 Syncope and collapse: Secondary | ICD-10-CM

## 2011-12-29 LAB — PACEMAKER DEVICE OBSERVATION

## 2011-12-29 NOTE — Patient Instructions (Addendum)
Return office visit with Dr. Ladona Ridgel 03/23/12 @ 2:30pm.

## 2012-03-23 ENCOUNTER — Encounter: Payer: Medicaid Other | Admitting: Internal Medicine

## 2012-07-20 ENCOUNTER — Ambulatory Visit (INDEPENDENT_AMBULATORY_CARE_PROVIDER_SITE_OTHER): Payer: Medicaid Other | Admitting: Internal Medicine

## 2012-07-20 ENCOUNTER — Encounter: Payer: Self-pay | Admitting: Internal Medicine

## 2012-07-20 VITALS — BP 125/82 | HR 76 | Ht 71.0 in | Wt 169.0 lb

## 2012-07-20 DIAGNOSIS — I5022 Chronic systolic (congestive) heart failure: Secondary | ICD-10-CM

## 2012-07-20 DIAGNOSIS — R55 Syncope and collapse: Secondary | ICD-10-CM

## 2012-07-20 LAB — PACEMAKER DEVICE OBSERVATION

## 2012-07-20 NOTE — Patient Instructions (Addendum)
Your physician recommends that you schedule a follow-up appointment in: 3 months with the device clinic and 12 months with Dr Taylor  

## 2012-07-20 NOTE — Progress Notes (Signed)
HPI [name] returns today for followup. He is a pleasant 62 yo man with a h/o unexplained syncope, s/p ILR insertion. He has a h/o atrial fibrillation. In the interim, he has been working. No recurrent syncope. No other symptoms.  No Known Allergies   Current Outpatient Prescriptions  Medication Sig Dispense Refill  . amiodarone (PACERONE) 200 MG tablet Take 200 mg by mouth daily.        Marland Kitchen aspirin EC 81 MG tablet Take 81 mg by mouth daily.        . dabigatran (PRADAXA) 150 MG CAPS Take 150 mg by mouth every 12 (twelve) hours.        . furosemide (LASIX) 40 MG tablet Take 40 mg by mouth daily.        Marland Kitchen lisinopril (PRINIVIL,ZESTRIL) 5 MG tablet Take 5 mg by mouth daily.      Marland Kitchen losartan (COZAAR) 25 MG tablet Take 25 mg by mouth daily.      . metoprolol (TOPROL-XL) 50 MG 24 hr tablet Take 50 mg by mouth daily.       Marland Kitchen spironolactone (ALDACTONE) 25 MG tablet Take 25 mg by mouth daily.      Marland Kitchen tiotropium (SPIRIVA) 18 MCG inhalation capsule Place 18 mcg into inhaler and inhale daily.         No current facility-administered medications for this visit.     Past Medical History  Diagnosis Date  . 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 CAD (coronary artery disease) 12/15/2004  . Seizures   . AF (atrial fibrillation)   . COPD (chronic obstructive pulmonary disease)   . Angina pectoris   . HTN (hypertension)   . Alcohol abuse     ROS:   All systems reviewed and negative except as noted in the HPI.   Past Surgical History  Procedure Laterality Date  . No past surgeries    . Cardioversion  01/21/2011    Procedure: CARDIOVERSION;  Surgeon: Darden Palmer., MD;  Location: [facility] OR;  Service: Cardiovascular;  Laterality: N/A;  . Fx collar bone    . Cardiac catheterization  12/2004    Left   . Doppler echocardiography  02/2011     Family History  Problem Relation Age of Onset  . Stroke Mother   . Coronary artery disease Father   . COPD Father      History   Social History   . Marital Status: Single    Spouse Name: N/A    Number of Children: N/A  . 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 Smoking status: Current Every Day Smoker -- 1.50 packs/day for 38 years  . Smokeless tobacco: Not on file  . Alcohol Use:   . Drug Use:   . Sexually Active:    Other Topics Concern  . Not on file   Social History Narrative  . No narrative on file     BP 125/82  Pulse 76  Ht 5\' 11"  (1.803 m)  Wt 169 lb (76.658 kg)  BMI 23.58 kg/m2  Physical Exam:  Diskempt but Well appearing 62 yo man, NAD HEENT: Unremarkable Neck:  6 cm JVD, no thyromegally Back:  No CVA tenderness Lungs:  Clear with no wheezes, rales, or rhonchi HEART:  Regular rate rhythm, no murmurs, no rubs, no clicks Abd:  soft, positive bowel sounds, no organomegally, no rebound, no guarding Ext:  2 plus pulses, no edema,  no cyanosis, no clubbing Skin:  No rashes no nodules Neuro:  CN II through XII intact, motor grossly intact  EKG - nsr  DEVICE  Normal device function.  ILR demonstrates no brady or tachy arrhythmias  Assess/Plan:

## 2012-07-20 NOTE — Assessment & Plan Note (Signed)
He has an ejection fraction of 40% by echo. He had a negative EP study. His CHF symptoms are class 1. He will continue his current meds and maintain a low sodium diet.

## 2012-07-20 NOTE — Assessment & Plan Note (Signed)
No recurrent episodes. He will undergo watchful waiting.

## 2012-07-27 ENCOUNTER — Encounter: Payer: Self-pay | Admitting: Internal Medicine

## 2012-09-14 ENCOUNTER — Ambulatory Visit
Admission: RE | Admit: 2012-09-14 | Discharge: 2012-09-14 | Disposition: A | Discharge status: Home / Self Care | Payer: Medicaid Other | Source: Ambulatory Visit | Attending: Cardiology | Admitting: Cardiology

## 2012-09-14 ENCOUNTER — Other Ambulatory Visit: Payer: Self-pay | Admitting: Cardiology

## 2012-09-14 DIAGNOSIS — I4891 Unspecified atrial fibrillation: Secondary | ICD-10-CM

## 2012-10-19 ENCOUNTER — Telehealth: Payer: Self-pay | Admitting: Internal Medicine

## 2012-10-19 NOTE — Telephone Encounter (Signed)
Sob with cp on left size, has a recorder in chest and it hurts also.

## 2012-10-19 NOTE — Telephone Encounter (Signed)
Patient called in to the office today complaining of increased SOB with exertion and stabbing knife like pains in the axilla area of the left arm for 2 weeks. He states that the pain is so severe that he feels like he will pass out. Advised him to call 911 and go to the ER. Patient does not want to do this because he states he can's afford the medical bills.  Encouraged him to call 911 again because he states that his pain is so severe. States he will think about it. Advised again that people who do not seek medical help with an acute crisis will not do well because they wait too long.

## 2012-12-08 ENCOUNTER — Emergency Department (HOSPITAL_COMMUNITY)
Admission: EM | Admit: 2012-12-08 | Discharge: 2012-12-08 | Disposition: A | Discharge status: Home / Self Care | Payer: Medicaid Other | Attending: Emergency Medicine | Admitting: Emergency Medicine

## 2012-12-08 ENCOUNTER — Encounter (HOSPITAL_COMMUNITY): Payer: Self-pay | Admitting: Emergency Medicine

## 2012-12-08 ENCOUNTER — Emergency Department (HOSPITAL_COMMUNITY): Payer: Medicaid Other

## 2012-12-08 DIAGNOSIS — Z9861 Coronary angioplasty status: Secondary | ICD-10-CM | POA: Insufficient documentation

## 2012-12-08 DIAGNOSIS — I4891 Unspecified atrial fibrillation: Secondary | ICD-10-CM | POA: Insufficient documentation

## 2012-12-08 DIAGNOSIS — R1084 Generalized abdominal pain: Secondary | ICD-10-CM | POA: Insufficient documentation

## 2012-12-08 DIAGNOSIS — F17200 Nicotine dependence, unspecified, uncomplicated: Secondary | ICD-10-CM | POA: Insufficient documentation

## 2012-12-08 DIAGNOSIS — I2510 Atherosclerotic heart disease of native coronary artery without angina pectoris: Secondary | ICD-10-CM | POA: Insufficient documentation

## 2012-12-08 DIAGNOSIS — I10 Essential (primary) hypertension: Secondary | ICD-10-CM | POA: Insufficient documentation

## 2012-12-08 DIAGNOSIS — J4489 Other specified chronic obstructive pulmonary disease: Secondary | ICD-10-CM | POA: Insufficient documentation

## 2012-12-08 DIAGNOSIS — Z7982 Long term (current) use of aspirin: Secondary | ICD-10-CM | POA: Insufficient documentation

## 2012-12-08 DIAGNOSIS — K5900 Constipation, unspecified: Secondary | ICD-10-CM | POA: Insufficient documentation

## 2012-12-08 DIAGNOSIS — J449 Chronic obstructive pulmonary disease, unspecified: Secondary | ICD-10-CM | POA: Insufficient documentation

## 2012-12-08 DIAGNOSIS — Z7902 Long term (current) use of antithrombotics/antiplatelets: Secondary | ICD-10-CM | POA: Insufficient documentation

## 2012-12-08 DIAGNOSIS — I209 Angina pectoris, unspecified: Secondary | ICD-10-CM | POA: Insufficient documentation

## 2012-12-08 DIAGNOSIS — R9389 Abnormal findings on diagnostic imaging of other specified body structures: Secondary | ICD-10-CM

## 2012-12-08 DIAGNOSIS — I509 Heart failure, unspecified: Secondary | ICD-10-CM | POA: Insufficient documentation

## 2012-12-08 DIAGNOSIS — Z79899 Other long term (current) drug therapy: Secondary | ICD-10-CM | POA: Insufficient documentation

## 2012-12-08 DIAGNOSIS — R109 Unspecified abdominal pain: Secondary | ICD-10-CM

## 2012-12-08 DIAGNOSIS — G40909 Epilepsy, unspecified, not intractable, without status epilepticus: Secondary | ICD-10-CM | POA: Insufficient documentation

## 2012-12-08 LAB — COMPREHENSIVE METABOLIC PANEL
ALT: 11 U/L (ref 0–53)
AST: 16 U/L (ref 0–37)
Albumin: 4 g/dL (ref 3.5–5.2)
Alkaline Phosphatase: 55 U/L (ref 39–117)
BUN: 24 mg/dL — ABNORMAL HIGH (ref 6–23)
CO2: 28 mEq/L (ref 19–32)
Calcium: 9.6 mg/dL (ref 8.4–10.5)
Chloride: 101 mEq/L (ref 96–112)
Creatinine, Ser: 1.12 mg/dL (ref 0.50–1.35)
GFR calc Af Amer: 81 mL/min — ABNORMAL LOW (ref >90)
GFR calc non Af Amer: 70 mL/min — ABNORMAL LOW (ref >90)
Glucose, Bld: 132 mg/dL — ABNORMAL HIGH (ref 70–99)
Potassium: 4.5 mEq/L (ref 3.5–5.1)
Sodium: 137 mEq/L (ref 135–145)
Total Bilirubin: 0.2 mg/dL — ABNORMAL LOW (ref 0.3–1.2)
Total Protein: 7.1 g/dL (ref 6.0–8.3)

## 2012-12-08 LAB — CBC WITH DIFFERENTIAL/PLATELET
Basophils Absolute: 0 10*3/uL (ref 0.0–0.1)
Basophils Relative: 0 % (ref 0–1)
Eosinophils Absolute: 0.1 10*3/uL (ref 0.0–0.7)
Eosinophils Relative: 2 % (ref 0–5)
HCT: 44.7 % (ref 39.0–52.0)
Hemoglobin: 15.3 g/dL (ref 13.0–17.0)
Lymphocytes Relative: 17 % (ref 12–46)
Lymphs Abs: 1.4 10*3/uL (ref 0.7–4.0)
MCH: 32.5 pg (ref 26.0–34.0)
MCHC: 34.2 g/dL (ref 30.0–36.0)
MCV: 94.9 fL (ref 78.0–100.0)
Monocytes Absolute: 0.6 10*3/uL (ref 0.1–1.0)
Monocytes Relative: 8 % (ref 3–12)
Neutro Abs: 6.1 10*3/uL (ref 1.7–7.7)
Neutrophils Relative %: 74 % (ref 43–77)
Platelets: 243 10*3/uL (ref 150–400)
RBC: 4.71 MIL/uL (ref 4.22–5.81)
RDW: 14.5 % (ref 11.5–15.5)
WBC: 8.3 10*3/uL (ref 4.0–10.5)

## 2012-12-08 LAB — URINALYSIS, ROUTINE W REFLEX MICROSCOPIC
Bilirubin Urine: NEGATIVE
Glucose, UA: NEGATIVE mg/dL
Hgb urine dipstick: NEGATIVE
Ketones, ur: NEGATIVE mg/dL
Leukocytes, UA: NEGATIVE
Nitrite: NEGATIVE
Protein, ur: NEGATIVE mg/dL
Specific Gravity, Urine: 1.03 (ref 1.005–1.030)
Urobilinogen, UA: 0.2 mg/dL (ref 0.0–1.0)
pH: 6 (ref 5.0–8.0)

## 2012-12-08 LAB — LIPASE, BLOOD: Lipase: 24 U/L (ref 11–59)

## 2012-12-08 LAB — OCCULT BLOOD, POC DEVICE: Fecal Occult Bld: NEGATIVE

## 2012-12-08 MED ORDER — IOHEXOL 300 MG/ML  SOLN
100.0000 mL | Freq: Once | INTRAMUSCULAR | Status: AC | PRN
  Administered 2012-12-08: 100 mL via INTRAVENOUS

## 2012-12-08 MED ORDER — FLEET ENEMA 7-19 GM/118ML RE ENEM
1.0000 | ENEMA | Freq: Once | RECTAL | Status: AC
  Administered 2012-12-08: 1 via RECTAL
  Filled 2012-12-08: qty 1

## 2012-12-08 MED ORDER — IOHEXOL 300 MG/ML  SOLN
50.0000 mL | Freq: Once | INTRAMUSCULAR | Status: AC | PRN
  Administered 2012-12-08: 50 mL via ORAL

## 2012-12-08 MED ORDER — POLYETHYLENE GLYCOL 3350 17 G PO PACK: 17.0000 g | PACK | Freq: Two times a day (BID) | ORAL | Status: AC

## 2012-12-08 NOTE — ED Provider Notes (Signed)
CSN: [id number]     Arrival date & time 12/08/12  1249 History   First MD Initiated Contact with Patient 12/08/12 1416     Chief Complaint  Patient presents with  . Abdominal Pain  . Constipation    LBM few days ago   (Consider location/radiation/quality/duration/timing/severity/associated sxs/prior Treatment) HPI Comments: ADDISON WHIDBEE is a 62 y.o. Male who presents for evaluation of constipation for one week without stool, and low abdominal pain. He is using an over-the-counter stool softener, without relief. He denies fever, chills, weakness, or dizziness. He has had had decreased oral intake because he is not hungry. He has not had this previously. There are no other known modifying factors. He came here for evaluation by EMS. He is currently living at a shelter.  Patient is a 62 y.o. male presenting with abdominal pain and constipation. The history is provided by the patient.  Abdominal Pain Associated symptoms: constipation   Constipation Associated symptoms: abdominal pain     Past Medical History  Diagnosis Date  . 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 CAD (coronary artery disease) 12/15/2004  . Seizures   . AF (atrial fibrillation)   . COPD (chronic obstructive pulmonary disease)   . Angina pectoris   . HTN (hypertension)   . Alcohol abuse   . CHF (congestive heart failure)    Past Surgical History  Procedure Laterality Date  . No past surgeries    . Cardioversion  01/21/2011    Procedure: CARDIOVERSION;  Surgeon: Darden Palmer., MD;  Location: [facility] OR;  Service: Cardiovascular;  Laterality: N/A;  . Fx collar bone    . Cardiac catheterization  12/2004    Left   . Doppler echocardiography  02/2011   Family History  Problem Relation Age of Onset  . Stroke Mother   . Coronary artery disease Father   . COPD Father    History  Substance Use Topics  . Smoking status: Current Every Day Smoker -- 1.50 packs/day for 38 years  . Smokeless tobacco: Not on file  .  Alcohol Use: Not on file    Review of Systems  Gastrointestinal: Positive for abdominal pain and constipation.  All other systems reviewed and are negative.    Allergies  Review of patient's allergies indicates no known allergies.  Home Medications   Current Outpatient Rx  Name  Route  Sig  Dispense  Refill  . amiodarone (PACERONE) 200 MG tablet   Oral   Take 200 mg by mouth daily.           Marland Kitchen aspirin EC 81 MG tablet   Oral   Take 81 mg by mouth daily.           . dabigatran (PRADAXA) 150 MG CAPS   Oral   Take 150 mg by mouth every 12 (twelve) hours.           . furosemide (LASIX) 40 MG tablet   Oral   Take 40 mg by mouth daily.           Marland Kitchen lisinopril (PRINIVIL,ZESTRIL) 5 MG tablet   Oral   Take 5 mg by mouth daily.         Marland Kitchen losartan (COZAAR) 25 MG tablet   Oral   Take 25 mg by mouth daily.         . metoprolol (TOPROL-XL) 50 MG 24 hr tablet   Oral   Take 50 mg by mouth daily.          Marland Kitchen  spironolactone (ALDACTONE) 25 MG tablet   Oral   Take 25 mg by mouth daily.         Marland Kitchen tiotropium (SPIRIVA) 18 MCG inhalation capsule   Inhalation   Place 18 mcg into inhaler and inhale daily.            BP 142/86  Pulse 51  Temp(Src) 98.4 F (36.9 C) (Oral)  Resp 21  SpO2 93% Physical Exam  Nursing note and vitals reviewed. Constitutional: He is oriented to person, place, and time. He appears well-developed and well-nourished.  HENT:  Head: Normocephalic and atraumatic.  Right Ear: External ear normal.  Left Ear: External ear normal.  Eyes: Conjunctivae and EOM are normal. Pupils are equal, round, and reactive to light.  Neck: Normal range of motion and phonation normal. Neck supple.  Cardiovascular: Normal rate, regular rhythm, normal heart sounds and intact distal pulses.   Pulmonary/Chest: Effort normal and breath sounds normal. He exhibits no bony tenderness.  Abdominal: Soft. Normal appearance. He exhibits no mass. There is tenderness  (mild, diffuse). There is no rebound and no guarding.  Genitourinary:  Anus normal. Small amount of brown stool in the rectal vault. No fecal impaction, no rectal mass.  Musculoskeletal: Normal range of motion.  Neurological: He is alert and oriented to person, place, and time. No cranial nerve deficit or sensory deficit. He exhibits normal muscle tone. Coordination normal.  Skin: Skin is warm, dry and intact.  Psychiatric: He has a normal mood and affect. His behavior is normal. Judgment and thought content normal.    ED Course  Procedures (including critical care time)  Medications  sodium phosphate (FLEET) 7-19 GM/118ML enema 1 enema (1 enema Rectal Given 12/08/12 1531)  iohexol (OMNIPAQUE) 300 MG/ML solution 50 mL (50 mLs Oral Contrast Given 12/08/12 1509)  iohexol (OMNIPAQUE) 300 MG/ML solution 100 mL (100 mLs Intravenous Contrast Given 12/08/12 1621)    Patient Vitals for the past 24 hrs:  BP Temp Temp src Pulse Resp SpO2  12/08/12 1410 - - - 51 21 -  12/08/12 1301 142/86 mmHg 98.4 F (36.9 C) Oral 62 28 93 %  12/08/12 1257 - - - - - 94 %       Labs Review Labs Reviewed  COMPREHENSIVE METABOLIC PANEL - Abnormal; Notable for the following:    Glucose, Bld 132 (*)    BUN 24 (*)    Total Bilirubin 0.2 (*)    GFR calc non Af Amer 70 (*)    GFR calc Af Amer 81 (*)    All other components within normal limits  CBC WITH DIFFERENTIAL  LIPASE, BLOOD  URINALYSIS, ROUTINE W REFLEX MICROSCOPIC  OCCULT BLOOD X 1 CARD TO LAB, STOOL  OCCULT BLOOD, POC DEVICE   Imaging Review No results found.  EKG Interpretation   None       MDM   1. Abdominal pain       Nonspecific abdominal pain, with constipation. Patient requires evaluation with CT imaging, and a screening blood tests.  Nursing Notes Reviewed/ Care Coordinated, and agree without changes. Applicable Imaging Reviewed.  Interpretation of Laboratory Data incorporated into ED treatment  Care to oncoming  providers for evaluation- 1600    Flint Melter, MD 12/08/12 [phone]

## 2012-12-08 NOTE — ED Notes (Signed)
Per pt has not gone LBM x 7 days ago. Positive flatus small BM today hard.

## 2012-12-08 NOTE — ED Notes (Signed)
Bed: WA21 Expected date:  Expected time:  Means of arrival:  Comments: ems- male, constipation

## 2012-12-08 NOTE — ED Notes (Signed)
Pt presents with NAD Per EMS c/o stomach pain lower only constipation for several day chronic condition for pt. Tried elax bar ate 1/2 with no results. Denies N/V fever at present

## 2012-12-10 ENCOUNTER — Other Ambulatory Visit: Payer: Self-pay | Admitting: Internal Medicine

## 2013-01-10 ENCOUNTER — Emergency Department (HOSPITAL_COMMUNITY)
Admission: EM | Admit: 2013-01-10 | Discharge: 2013-01-10 | Discharge status: Against Medical Advice | Payer: Medicaid Other | Attending: Emergency Medicine | Admitting: Emergency Medicine

## 2013-01-10 ENCOUNTER — Emergency Department (HOSPITAL_COMMUNITY): Payer: Medicaid Other

## 2013-01-10 ENCOUNTER — Encounter (HOSPITAL_COMMUNITY): Payer: Self-pay | Admitting: Emergency Medicine

## 2013-01-10 DIAGNOSIS — I2510 Atherosclerotic heart disease of native coronary artery without angina pectoris: Secondary | ICD-10-CM | POA: Insufficient documentation

## 2013-01-10 DIAGNOSIS — IMO0002 Reserved for concepts with insufficient information to code with codable children: Secondary | ICD-10-CM | POA: Insufficient documentation

## 2013-01-10 DIAGNOSIS — Z79899 Other long term (current) drug therapy: Secondary | ICD-10-CM | POA: Insufficient documentation

## 2013-01-10 DIAGNOSIS — F10229 Alcohol dependence with intoxication, unspecified: Secondary | ICD-10-CM | POA: Insufficient documentation

## 2013-01-10 DIAGNOSIS — I428 Other cardiomyopathies: Secondary | ICD-10-CM | POA: Insufficient documentation

## 2013-01-10 DIAGNOSIS — F911 Conduct disorder, childhood-onset type: Secondary | ICD-10-CM | POA: Insufficient documentation

## 2013-01-10 DIAGNOSIS — Z9889 Other specified postprocedural states: Secondary | ICD-10-CM | POA: Insufficient documentation

## 2013-01-10 DIAGNOSIS — Z7982 Long term (current) use of aspirin: Secondary | ICD-10-CM | POA: Insufficient documentation

## 2013-01-10 DIAGNOSIS — J449 Chronic obstructive pulmonary disease, unspecified: Secondary | ICD-10-CM | POA: Insufficient documentation

## 2013-01-10 DIAGNOSIS — I10 Essential (primary) hypertension: Secondary | ICD-10-CM | POA: Insufficient documentation

## 2013-01-10 DIAGNOSIS — F10929 Alcohol use, unspecified with intoxication, unspecified: Secondary | ICD-10-CM

## 2013-01-10 DIAGNOSIS — J4489 Other specified chronic obstructive pulmonary disease: Secondary | ICD-10-CM | POA: Insufficient documentation

## 2013-01-10 DIAGNOSIS — I509 Heart failure, unspecified: Secondary | ICD-10-CM | POA: Insufficient documentation

## 2013-01-10 DIAGNOSIS — F17200 Nicotine dependence, unspecified, uncomplicated: Secondary | ICD-10-CM | POA: Insufficient documentation

## 2013-01-10 DIAGNOSIS — I4891 Unspecified atrial fibrillation: Secondary | ICD-10-CM | POA: Insufficient documentation

## 2013-01-10 DIAGNOSIS — Z7902 Long term (current) use of antithrombotics/antiplatelets: Secondary | ICD-10-CM | POA: Insufficient documentation

## 2013-01-10 MED ORDER — HALOPERIDOL LACTATE 5 MG/ML IJ SOLN
5.0000 mg | Freq: Once | INTRAMUSCULAR | Status: AC
  Administered 2013-01-10: 5 mg via INTRAMUSCULAR
  Filled 2013-01-10: qty 1

## 2013-01-10 MED ORDER — LORAZEPAM 2 MG/ML IJ SOLN
2.0000 mg | Freq: Once | INTRAMUSCULAR | Status: AC
  Administered 2013-01-10: 2 mg via INTRAMUSCULAR
  Filled 2013-01-10: qty 1
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
## 2013-06-27 ENCOUNTER — Ambulatory Visit
Admission: RE | Admit: 2013-06-27 | Discharge: 2013-06-27 | Disposition: A | Discharge status: Home / Self Care | Payer: Medicaid Other | Source: Ambulatory Visit | Attending: Cardiology | Admitting: Cardiology

## 2013-06-27 ENCOUNTER — Other Ambulatory Visit: Payer: Self-pay | Admitting: Cardiology

## 2013-06-27 DIAGNOSIS — I4891 Unspecified atrial fibrillation: Secondary | ICD-10-CM

## 2013-07-07 ENCOUNTER — Encounter (HOSPITAL_COMMUNITY): Payer: Self-pay | Admitting: Emergency Medicine

## 2013-07-07 ENCOUNTER — Emergency Department (HOSPITAL_COMMUNITY): Payer: Medicaid Other

## 2013-07-07 ENCOUNTER — Emergency Department (HOSPITAL_COMMUNITY)
Admission: EM | Admit: 2013-07-07 | Discharge: 2013-07-07 | Disposition: A | Discharge status: Home / Self Care | Payer: Medicaid Other | Attending: Emergency Medicine | Admitting: Emergency Medicine

## 2013-07-07 DIAGNOSIS — Z7982 Long term (current) use of aspirin: Secondary | ICD-10-CM | POA: Insufficient documentation

## 2013-07-07 DIAGNOSIS — L03019 Cellulitis of unspecified finger: Secondary | ICD-10-CM | POA: Insufficient documentation

## 2013-07-07 DIAGNOSIS — I10 Essential (primary) hypertension: Secondary | ICD-10-CM | POA: Insufficient documentation

## 2013-07-07 DIAGNOSIS — J4489 Other specified chronic obstructive pulmonary disease: Secondary | ICD-10-CM | POA: Insufficient documentation

## 2013-07-07 DIAGNOSIS — Z9889 Other specified postprocedural states: Secondary | ICD-10-CM | POA: Insufficient documentation

## 2013-07-07 DIAGNOSIS — I428 Other cardiomyopathies: Secondary | ICD-10-CM | POA: Insufficient documentation

## 2013-07-07 DIAGNOSIS — I209 Angina pectoris, unspecified: Secondary | ICD-10-CM | POA: Insufficient documentation

## 2013-07-07 DIAGNOSIS — I509 Heart failure, unspecified: Secondary | ICD-10-CM | POA: Insufficient documentation

## 2013-07-07 DIAGNOSIS — I2510 Atherosclerotic heart disease of native coronary artery without angina pectoris: Secondary | ICD-10-CM | POA: Insufficient documentation

## 2013-07-07 DIAGNOSIS — Z79899 Other long term (current) drug therapy: Secondary | ICD-10-CM | POA: Insufficient documentation

## 2013-07-07 DIAGNOSIS — I4891 Unspecified atrial fibrillation: Secondary | ICD-10-CM | POA: Insufficient documentation

## 2013-07-07 DIAGNOSIS — J449 Chronic obstructive pulmonary disease, unspecified: Secondary | ICD-10-CM | POA: Insufficient documentation

## 2013-07-07 DIAGNOSIS — IMO0002 Reserved for concepts with insufficient information to code with codable children: Secondary | ICD-10-CM

## 2013-07-07 MED ORDER — SULFAMETHOXAZOLE-TRIMETHOPRIM 800-160 MG PO TABS: 1.0000 | ORAL_TABLET | Freq: Two times a day (BID) | ORAL | Status: DC

## 2013-07-07 NOTE — ED Provider Notes (Signed)
CSN: [id number]     Arrival date & time 07/07/13  1119 History  This chart was scribed for non-physician practitioner Raymon Mutton, PA-C working with Nelia Shi, MD by Joaquin Music, ED Scribe. This patient was seen in room TR05C/TR05C and the patient's care was started at 12:10 PM .   Chief Complaint  Patient presents with  . Finger Injury   The history is provided by the patient. No language interpreter was used.   HPI Comments: Johnny Rodriguez is a 63 y.o. male who presents to the Emergency Department complaining of ongoing R 3rd finger paronychia that began about 1 month ago. States he has been placing peroxide, alcohol and ice to finger. Pt states he was seen by his PCP yesterday and was given Keflex for infected finger; states he was encouraged to come to the ED by PCP. Reports numbness and tingling to finger; describes pain "as a blob" and throbbing sensation that stays within the finger. States he "has a lot of pressure to finger". Denies recent injuries and drainage from 3rd finger. Denied diabetes.  Past Medical History  Diagnosis Date  . 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 CAD (coronary artery disease) 12/15/2004  . Seizures   . AF (atrial fibrillation)   . COPD (chronic obstructive pulmonary disease)   . Angina pectoris   . HTN (hypertension)   . Alcohol abuse   . CHF (congestive heart failure)    Past Surgical History  Procedure Laterality Date  . No past surgeries    . Cardioversion  01/21/2011    Procedure: CARDIOVERSION;  Surgeon: Darden Palmer., MD;  Location: [facility] OR;  Service: Cardiovascular;  Laterality: N/A;  . Fx collar bone    . Cardiac catheterization  12/2004    Left   . Doppler echocardiography  02/2011   Family History  Problem Relation Age of Onset  . Stroke Mother   . Coronary artery disease Father   . COPD Father    History  Substance Use Topics  . Smoking status: Current Every Day Smoker -- 1.50 packs/day for 38 years  .  Smokeless tobacco: Not on file  . Alcohol Use: Yes    Review of Systems  Skin: Positive for color change. Negative for wound.  Neurological: Negative for weakness and numbness.   Allergies  Review of patient's allergies indicates no known allergies.  Home Medications   Prior to Admission medications   Medication Sig Start Date End Date Taking? Authorizing Provider  amiodarone (PACERONE) 200 MG tablet Take 200 mg by mouth daily.      Historical Provider, MD  aspirin EC 81 MG tablet Take 81 mg by mouth daily.      Historical Provider, MD  dabigatran (PRADAXA) 150 MG CAPS Take 150 mg by mouth every 12 (twelve) hours.      Historical Provider, MD  furosemide (LASIX) 40 MG tablet Take 40 mg by mouth daily.      Historical Provider, MD  lisinopril (PRINIVIL,ZESTRIL) 5 MG tablet Take 5 mg by mouth daily.    Historical Provider, MD  lisinopril (PRINIVIL,ZESTRIL) 5 MG tablet TAKE 1 TABLET BY MOUTH EVERY DAY 12/10/12   Marinus Maw, MD  losartan (COZAAR) 25 MG tablet Take 25 mg by mouth daily.    Historical Provider, MD  metoprolol (TOPROL-XL) 50 MG 24 hr tablet Take 50 mg by mouth daily.     Historical Provider, MD  spironolactone (ALDACTONE) 25 MG tablet Take 25 mg by mouth daily.  Historical Provider, MD  tiotropium (SPIRIVA) 18 MCG inhalation capsule Place 18 mcg into inhaler and inhale daily.      Historical Provider, MD   BP 139/88  Pulse 52  Temp(Src) 98.1 F (36.7 C) (Oral)  Resp 20  Ht 5\' 11"  (1.803 m)  Wt 150 lb (68.04 kg)  BMI 20.93 kg/m2  SpO2 100%  Physical Exam  Nursing note and vitals reviewed. Constitutional: He is oriented to person, place, and time. He appears well-developed. No distress.  HENT:  Head: Normocephalic and atraumatic.  Mouth/Throat: Oropharynx is clear and moist. No oropharyngeal exudate.  Eyes: Conjunctivae and EOM are normal. Pupils are equal, round, and reactive to light. Right eye exhibits no discharge. Left eye exhibits no discharge.  Neck:  Normal range of motion. Neck supple. No tracheal deviation present.  Cardiovascular: Normal rate, regular rhythm and normal heart sounds.  Exam reveals no friction rub.   No murmur heard. Pulses:      Radial pulses are 2+ on the right side, and 2+ on the left side.  Pulmonary/Chest: Effort normal and breath sounds normal. No respiratory distress. He has no wheezes. He has no rales.  Musculoskeletal: Normal range of motion.  Lymphadenopathy:    He has no cervical adenopathy.  Neurological: He is alert and oriented to person, place, and time. No cranial nerve deficit. He exhibits normal muscle tone. Coordination normal.  Cranial nerves III-XII grossly intact Strength intact to MCP, PIP, DIP joints of right hand Sensation intact with differentiation sharp and dull touch  Skin: He is not diaphoretic.  Paronychia identified to the ulnar aspect of the right middle finger with surrounding erythema. Discomfort upon palpation to the pad of the finger distal aspect. Negative active drainage or bleeding noted. Negative red streaks.   Psychiatric: He has a normal mood and affect. His behavior is normal. Thought content normal.    ED Course  Procedures  DIAGNOSTIC STUDIES: Oxygen Saturation is 94% on RA, normal by my interpretation.    COORDINATION OF CARE: 12:15 PM-Discussed treatment plan which includes X-ray of R hand and drainage of paronychia upon X-ray review. Pt agreed to plan.   1:00 PM-Dr. Radford Pax began pt evaluation. As per attending physician, reported that his Felon that is a paronychia needs to be drained.  1:10 PM- INCISION AND DRAINAGE Performed by: Raymon Mutton Consent: Verbal consent obtained. Risks and benefits: risks, benefits and alternatives were discussed Sterile Prep and Drape Type: Paronychia Body area: Right middle finger, ulnar aspect Incision: 11 Blade Complexity: complex Blunt dissection to breakup loculations Drainage: Purulent  Drainage amount:  Moderate Flushed with copious amount of sterile saline Patient tolerance: Patient tolerated the procedure well with no immediate complications.   Labs Review Labs Reviewed - No data to display  Imaging Review Dg Finger Middle Right  07/07/2013   CLINICAL DATA:  63 year old male with middle finger swelling, possible infection. Initial encounter.  EXAM: RIGHT MIDDLE FINGER 2+V  COMPARISON:  Right hand series 10960.  FINDINGS: Bone mineralization appears stable and within normal limits. There is soft tissue swelling about the right third finger. No subcutaneous gas. No radiopaque foreign body identified. Joint spaces and alignment are stable. No osteolysis or fracture identified.  IMPRESSION: Soft tissue swelling without underlying acute osseous abnormality in the right middle finger.   Electronically Signed   By: Augusto Gamble M.D.   On: 07/07/2013 13:01     EKG Interpretation None     MDM   Final diagnoses:  Paronychia  Filed Vitals:   07/07/13 1126 07/07/13 1129 07/07/13 1331  BP:  162/102 139/88  Pulse:  69 52  Temp:  98.1 F (36.7 C)   TempSrc:  Oral   Resp:  20 20  Height: 5\' 11"  (1.803 m)    Weight: 150 lb (68.04 kg)    SpO2:  94% 100%   I personally performed the services described in this documentation, which was scribed in my presence. The recorded information has been reviewed and is accurate.  Plain film identified soft tissue swelling without underlying acute osseous abnormality in the right middle finger - no subcutaneous gas identified, no foreign bodies. Doubt felon. Doubt necrotizing fascitis. Patient presented to the ED with paronychia identified to the right middle finger, ulnar aspect. I&D performed with successful results of decent amount of purulent fluid drained. Patient tolerated procedure well. Patient stable, afebrile. Patient seen and assessed by attending physician. Discharged patient. Patient currently on antibiotics for infection-Keflex that just started  yesterday-discussed with patient to continue taking medication as prescribed. Discussed with patient to apply and soak finger in warm water and massage. Referred to primary care provider and hand specialist. Discussed with patient to closely monitor symptoms and if symptoms are to worsen or change to report back to the ED - strict return instructions given.  Patient agreed to plan of care, understood, all questions answered.   Raymon MuttonMarissa Raniya Golembeski, PA-C 07/07/13 1828

## 2013-07-07 NOTE — Discharge Instructions (Signed)
Please call your doctor for a followup appointment within 24-48 hours. When you talk to your doctor please let them know that you were seen in the emergency department and have them acquire all of your records so that they can discuss the findings with you and formulate a treatment plan to fully care for your new and ongoing problems. Please call and set-up appointment with hand specialist, Dr. Izora Ribas Please continue to use Keflex as prescribed Please please finger in warm water and massage at least 6-7 times per day Please avoid any physical strenuous activity Please continue to monitor symptoms closely and if symptoms are to worsen or change (fever greater than 101, chills, chest pain, shortness of breath, difficulty breathing, numbness, tingling, swelling to the finger, redness to the finger, hot to the touch, drainage, red streaks running down the finger, swelling to the finger and hand, decreased range of motion, injury) please report back to the ED immediately   Paronychia  Paronychia is an infection of the skin caused by germs. It happens by the fingernail or toenail. You can avoid it by not:  Pulling on hangnails.  Nail biting.  Thumb sucking.  Cutting fingernails and toenails too short.  Cutting the skin at the base and sides of the fingernail or toenail (cuticle). HOME CARE  Keep the fingers or toes very dry. Put rubber gloves over cotton gloves when putting hands in water.  Keep the wound clean and bandaged (dressed) as told by your doctor.  Soak the fingers or toes in warm water for 15 to 20 minutes. Soak them 3 to 4 times per day for germ infections. Fungal infections are difficult to treat. Fungal infections often require treatment for a long time.  Only take medicine as told by your doctor. GET HELP RIGHT AWAY IF:   You have redness, puffiness (swelling), or pain that gets worse.  You see yellowish-white fluid (pus) coming from the wound.  You have a fever.  You  have a bad smell coming from the wound or bandage. MAKE SURE YOU:  Understand these instructions.  Will watch your condition.  Will get help if you are not doing well or get worse. Document Released: 01/15/2009 Document Revised: 04/21/2011 Document Reviewed: 01/15/2009 [facility] Patient Information 2014 [place], [place].

## 2013-07-07 NOTE — ED Notes (Signed)
Pt was seen by Dr. August Saucer yesterday and given antibiotics for infected finger on the right hand, 3 rd digit. Has puss in the finger.

## 2013-07-07 NOTE — ED Notes (Signed)
Pt has infected right middle finger. Saw PCP yesterday who put him Keflex.

## 2013-07-08 NOTE — ED Provider Notes (Signed)
Medical screening examination/treatment/procedure(s) were conducted as a shared visit with non-physician practitioner(s) and myself.  I personally evaluated the patient during the encounter   .Face to face Exam:  General:  A&Ox3 HEENT:  Atraumatic Resp:  Normal effort Abd:  Nondistended Neuro:No focal deficits     Nelia Shi, MD 07/08/13 [phone]

## 2013-09-07 ENCOUNTER — Telehealth: Payer: Self-pay | Admitting: Internal Medicine

## 2013-09-07 NOTE — Telephone Encounter (Signed)
Will need an office visit prior.  Has not been seen since June of 2014  Will arrange follow up next available time

## 2013-09-07 NOTE — Telephone Encounter (Signed)
New message    Patient calling need cardiac clearance for upcoming procedure    Dr. Pati Gallo office [phone]. Fax # [phone].

## 2013-09-28 ENCOUNTER — Telehealth: Payer: Self-pay | Admitting: Internal Medicine

## 2013-09-28 ENCOUNTER — Encounter: Payer: Self-pay | Admitting: Internal Medicine

## 2013-09-28 ENCOUNTER — Ambulatory Visit (INDEPENDENT_AMBULATORY_CARE_PROVIDER_SITE_OTHER): Payer: Medicaid Other | Admitting: Internal Medicine

## 2013-09-28 VITALS — BP 124/92 | HR 60 | Ht 71.0 in | Wt 162.4 lb

## 2013-09-28 DIAGNOSIS — Z959 Presence of cardiac and vascular implant and graft, unspecified: Secondary | ICD-10-CM

## 2013-09-28 DIAGNOSIS — R55 Syncope and collapse: Secondary | ICD-10-CM

## 2013-09-28 DIAGNOSIS — I4891 Unspecified atrial fibrillation: Secondary | ICD-10-CM

## 2013-09-28 DIAGNOSIS — I480 Paroxysmal atrial fibrillation: Secondary | ICD-10-CM

## 2013-09-28 DIAGNOSIS — I5022 Chronic systolic (congestive) heart failure: Secondary | ICD-10-CM

## 2013-09-28 DIAGNOSIS — Z01810 Encounter for preprocedural cardiovascular examination: Secondary | ICD-10-CM

## 2013-09-28 LAB — MDC_IDC_ENUM_SESS_TYPE_INCLINIC

## 2013-09-28 NOTE — Assessment & Plan Note (Signed)
He is low risk for major cardiac complications from possible shoulder surgery. I have encouraged him to proceed. If needed, he can undergo MRI scanning with the loop recorder in place. If radiology refuses, the device could be removed though I would prefer not to.

## 2013-09-28 NOTE — Progress Notes (Signed)
HPI [name] returns today for followup. He is a pleasant 63 yo man with a h/o unexplained syncope, s/p ILR insertion. He has a h/o atrial fibrillation. In the interim, he has been stable but bothered by worsening shoulder pain and is considering surgery.  No recurrent syncope. No other symptoms.  No Known Allergies   Current Outpatient Prescriptions  Medication Sig Dispense Refill  . amiodarone (PACERONE) 200 MG tablet Take 200 mg by mouth daily.        Marland Kitchen. aspirin EC 81 MG tablet Take 81 mg by mouth daily.        . dabigatran (PRADAXA) 150 MG CAPS capsule Take 150 mg by mouth 2 (two) times daily.      . furosemide (LASIX) 40 MG tablet Take 40 mg by mouth daily.        Marland Kitchen. lisinopril (PRINIVIL,ZESTRIL) 5 MG tablet Take 5 mg by mouth daily.      . metoprolol (TOPROL-XL) 50 MG 24 hr tablet Take 50 mg by mouth daily.       Marland Kitchen. spironolactone (ALDACTONE) 25 MG tablet Take 25 mg by mouth daily.      Marland Kitchen. tiotropium (SPIRIVA) 18 MCG inhalation capsule Place 18 mcg into inhaler and inhale daily.         No current facility-administered medications for this visit.     Past Medical History  Diagnosis Date  . 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 CAD (coronary artery disease) 12/15/2004  . Seizures   . AF (atrial fibrillation)   . COPD (chronic obstructive pulmonary disease)   . Angina pectoris   . HTN (hypertension)   . Alcohol abuse   . CHF (congestive heart failure)     ROS:   All systems reviewed and negative except as noted in the HPI.   Past Surgical History  Procedure Laterality Date  . No past surgeries    . Cardioversion  01/21/2011    Procedure: CARDIOVERSION;  Surgeon: Darden PalmerW Spencer Tilley Jr., MD;  [facility];  Service: Cardiovascular;  Laterality: N/A;  . Fx collar bone    . Cardiac catheterization  12/2004    Left   . Doppler echocardiography  02/2011     Family History  Problem Relation Age of Onset  . Stroke Mother   . Coronary artery disease Father   . COPD Father       History   Social History  . Marital Status: Single    Spouse Name: N/A    Number of Children: N/A  . Years of Education: N/A   Occupational History  . Armed forces technical officercontruction worker    Social History Main Topics  . Smoking status: Current Every Day Smoker -- 1.50 packs/day for 38 years  . Smokeless tobacco: Not on file  . Alcohol Use: Yes  . Drug Use: No  . Sexual Activity: Not on file   Other Topics Concern  . Not on file   Social History Narrative  . No narrative on file     BP 124/92  Pulse 60  Ht 5\' 11"  (1.803 m)  Wt 162 lb 6.4 oz (73.664 kg)  BMI 22.66 kg/m2  Physical Exam:  Diskempt but well appearing 63 yo man, NAD HEENT: Unremarkable Neck:  6 cm JVD, no thyromegally Back:  No CVA tenderness Lungs:  Clear with no wheezes, rales, or rhonchi, loop recorder in place. HEART:  Regular rate rhythm, no murmurs, no rubs, no clicks Abd:  soft, positive bowel sounds, no organomegally, no rebound, no  guarding Ext:  2 plus pulses, no edema, no cyanosis, no clubbing Skin:  No rashes no nodules Neuro:  CN II through XII intact, motor grossly intact  EKG - nsr  DEVICE  Normal device function.  ILR demonstrates no brady or tachy arrhythmias  Assess/Plan:

## 2013-09-28 NOTE — Telephone Encounter (Signed)
New message      Pt forgot to ask Dr Ladona Ridgel if he will call Dr Farris Has to give clearance for surgery

## 2013-09-28 NOTE — Assessment & Plan Note (Signed)
He is maintaining NSR. No symptomatic tachy or brady episodes.

## 2013-09-28 NOTE — Patient Instructions (Signed)
Your physician recommends that you schedule a follow-up appointment in: 3 months with the Device Clinic and in 12 months with Dr.Taylor

## 2013-09-28 NOTE — Assessment & Plan Note (Signed)
His symptoms are now class 1. He will continue his current meds.

## 2013-09-29 NOTE — Telephone Encounter (Addendum)
Called patient back. He wanted to know if Dr.Taylor cleared him for shoulder surgery with Dr.Kramer. Advised that when he was seen yesterday, Dr Ladona Ridgel noted that he was low risk for major cardiac complications.  He is aware that I will forward the office note from 8/19 to Dr.Kramer.

## 2013-11-11 ENCOUNTER — Other Ambulatory Visit: Payer: Self-pay | Admitting: Internal Medicine

## 2013-12-21 ENCOUNTER — Encounter: Payer: Self-pay | Admitting: Internal Medicine

## 2013-12-29 ENCOUNTER — Encounter: Payer: Medicaid Other | Admitting: Internal Medicine

## 2014-01-12 ENCOUNTER — Ambulatory Visit (INDEPENDENT_AMBULATORY_CARE_PROVIDER_SITE_OTHER): Payer: Medicaid Other | Admitting: *Deleted

## 2014-01-12 ENCOUNTER — Encounter: Payer: Self-pay | Admitting: Cardiology

## 2014-01-12 DIAGNOSIS — R55 Syncope and collapse: Secondary | ICD-10-CM

## 2014-01-12 LAB — MDC_IDC_ENUM_SESS_TYPE_INCLINIC

## 2014-01-12 NOTE — Progress Notes (Signed)
Loop check in clinic while pt was seeing Dr. Mayford Knife.  Pt with 0 tachy episodes; 0 brady episodes; 1 asystole---was 2 consecutive undersensed PVCs. ROV w/ device clinic in 75mo & w/ GT in 21mo.

## 2014-01-13 NOTE — Progress Notes (Signed)
This encounter was created in error - please disregard.

## 2014-01-19 ENCOUNTER — Encounter (HOSPITAL_COMMUNITY): Payer: Self-pay | Admitting: Internal Medicine

## 2014-01-26 ENCOUNTER — Encounter: Payer: Self-pay | Admitting: Internal Medicine

## 2014-02-05 ENCOUNTER — Emergency Department: Payer: Self-pay | Admitting: Emergency Medicine

## 2014-02-05 LAB — BASIC METABOLIC PANEL
Anion Gap: 7 (ref 7–16)
BUN: 18 mg/dL (ref 7–18)
Calcium, Total: 8.8 mg/dL (ref 8.5–10.1)
Chloride: 104 mmol/L (ref 98–107)
Co2: 28 mmol/L (ref 21–32)
Creatinine: 1.1 mg/dL (ref 0.60–1.30)
EGFR (African American): >60
EGFR (Non-African Amer.): >60
Glucose: 91 mg/dL (ref 65–99)
Osmolality: 279 (ref 275–301)
Potassium: 4.2 mmol/L (ref 3.5–5.1)
Sodium: 139 mmol/L (ref 136–145)

## 2014-02-05 LAB — CBC
HCT: 38.1 % — ABNORMAL LOW (ref 40.0–52.0)
HGB: 12.9 g/dL — ABNORMAL LOW (ref 13.0–18.0)
MCH: 31.6 pg (ref 26.0–34.0)
MCHC: 34 g/dL (ref 32.0–36.0)
MCV: 93 fL (ref 80–100)
Platelet: 212 10*3/uL (ref 150–440)
RBC: 4.1 10*6/uL — ABNORMAL LOW (ref 4.40–5.90)
RDW: 13.1 % (ref 11.5–14.5)
WBC: 7.8 10*3/uL (ref 3.8–10.6)

## 2014-02-05 LAB — TROPONIN I: Troponin-I: <0.02 ng/mL

## 2014-02-06 LAB — TROPONIN I: Troponin-I: <0.02 ng/mL

## 2014-05-16 ENCOUNTER — Emergency Department (HOSPITAL_COMMUNITY): Payer: Medicaid Other

## 2014-05-16 ENCOUNTER — Encounter (HOSPITAL_COMMUNITY): Payer: Self-pay | Admitting: Emergency Medicine

## 2014-05-16 ENCOUNTER — Emergency Department (HOSPITAL_COMMUNITY)
Admission: EM | Admit: 2014-05-16 | Discharge: 2014-05-16 | Disposition: A | Discharge status: Home / Self Care | Payer: Medicaid Other | Attending: Emergency Medicine | Admitting: Emergency Medicine

## 2014-05-16 DIAGNOSIS — Y998 Other external cause status: Secondary | ICD-10-CM | POA: Diagnosis not present

## 2014-05-16 DIAGNOSIS — I10 Essential (primary) hypertension: Secondary | ICD-10-CM | POA: Diagnosis not present

## 2014-05-16 DIAGNOSIS — Z8673 Personal history of transient ischemic attack (TIA), and cerebral infarction without residual deficits: Secondary | ICD-10-CM | POA: Diagnosis not present

## 2014-05-16 DIAGNOSIS — Z9889 Other specified postprocedural states: Secondary | ICD-10-CM | POA: Diagnosis not present

## 2014-05-16 DIAGNOSIS — Z23 Encounter for immunization: Secondary | ICD-10-CM | POA: Insufficient documentation

## 2014-05-16 DIAGNOSIS — Y9389 Activity, other specified: Secondary | ICD-10-CM | POA: Diagnosis not present

## 2014-05-16 DIAGNOSIS — Z8659 Personal history of other mental and behavioral disorders: Secondary | ICD-10-CM | POA: Diagnosis not present

## 2014-05-16 DIAGNOSIS — S4992XA Unspecified injury of left shoulder and upper arm, initial encounter: Secondary | ICD-10-CM | POA: Diagnosis present

## 2014-05-16 DIAGNOSIS — Z7982 Long term (current) use of aspirin: Secondary | ICD-10-CM | POA: Insufficient documentation

## 2014-05-16 DIAGNOSIS — S42032A Displaced fracture of lateral end of left clavicle, initial encounter for closed fracture: Secondary | ICD-10-CM

## 2014-05-16 DIAGNOSIS — Z79899 Other long term (current) drug therapy: Secondary | ICD-10-CM | POA: Diagnosis not present

## 2014-05-16 DIAGNOSIS — J449 Chronic obstructive pulmonary disease, unspecified: Secondary | ICD-10-CM | POA: Insufficient documentation

## 2014-05-16 DIAGNOSIS — I509 Heart failure, unspecified: Secondary | ICD-10-CM | POA: Diagnosis not present

## 2014-05-16 DIAGNOSIS — Y92410 Unspecified street and highway as the place of occurrence of the external cause: Secondary | ICD-10-CM | POA: Insufficient documentation

## 2014-05-16 DIAGNOSIS — I2510 Atherosclerotic heart disease of native coronary artery without angina pectoris: Secondary | ICD-10-CM | POA: Diagnosis not present

## 2014-05-16 DIAGNOSIS — Z720 Tobacco use: Secondary | ICD-10-CM | POA: Diagnosis not present

## 2014-05-16 MED ORDER — OXYCODONE-ACETAMINOPHEN 5-325 MG PO TABS
1.0000 | ORAL_TABLET | Freq: Once | ORAL | Status: AC
  Administered 2014-05-16: 1 via ORAL
  Filled 2014-05-16: qty 1

## 2014-05-16 MED ORDER — OXYCODONE-ACETAMINOPHEN 5-325 MG PO TABS: 1.0000 | ORAL_TABLET | ORAL | Status: DC | PRN

## 2014-05-16 MED ORDER — TETANUS-DIPHTH-ACELL PERTUSSIS 5-2.5-18.5 LF-MCG/0.5 IM SUSP
0.5000 mL | Freq: Once | INTRAMUSCULAR | Status: AC
  Administered 2014-05-16: 0.5 mL via INTRAMUSCULAR
  Filled 2014-05-16: qty 0.5
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
## 2014-05-16 NOTE — ED Notes (Signed)
Patient is also complaining of neck pain but does not have midline point tenderness.  Tenderness is in the left lower neck.

## 2014-05-16 NOTE — Discharge Instructions (Signed)
Wear the sling as needed.  Clavicle Fracture The clavicle, also called the collarbone, is the long bone that connects your shoulder to your rib cage. You can feel your collarbone at the top of your shoulders and rib cage. A clavicle fracture is a broken clavicle. It is a common injury that can happen at any age.  CAUSES Common causes of a clavicle fracture include:  A direct blow to your shoulder.  A car accident.  A fall, especially if you try to break your fall with an outstretched arm. RISK FACTORS You may be at increased risk if:  You are younger than 25 years or older than 75 years. Most clavicle fractures happen to people who are younger than 25 years.  You are a male.  You play contact sports. SIGNS AND SYMPTOMS A fractured clavicle is painful. It also makes it hard to move your arm. Other signs and symptoms may include:  A shoulder that drops downward and forward.  Pain when trying to lift your shoulder.  Bruising, swelling, and tenderness over your clavicle.  A grinding noise when you try to move your shoulder.  A bump over your clavicle. DIAGNOSIS Your health care provider can usually diagnose a clavicle fracture by asking about your injury and examining your shoulder and clavicle. He or she may take an X-ray to determine the position of your clavicle. TREATMENT Treatment depends on the position of your clavicle after the fracture:  If the broken ends of the bone are not out of place, your health care provider may put your arm in a sling or wrap a support bandage around your chest (figure-of-eight wrap).  If the broken ends of the bone are out of place, you may need surgery. Surgery may involve placing screws, pins, or plates to keep your clavicle stable while it heals. Healing may take about 3 months. When your health care provider thinks your fracture has healed enough, you may have to do physical therapy to regain normal movement and build up your arm  strength. HOME CARE INSTRUCTIONS   Apply ice to the injured area:  Put ice in a plastic bag.  Place a towel between your skin and the bag.  Leave the ice on for 20 minutes, 2-3 times a day.  If you have a wrap or splint:  Wear it all the time, and remove it only to take a bath or shower.  When you bathe or shower, keep your shoulder in the same position as when the sling or wrap is on.  Do not lift your arm.  If you have a figure-of-eight wrap:  Another person must tighten it every day.  It should be tight enough to hold your shoulders back.  Allow enough room to place your index finger between your body and the strap.  Loosen the wrap immediately if you feel numbness or tingling in your hands.  Only take medicines as directed by your health care provider.  Avoid activities that make the injury or pain worse for 4-6 weeks after surgery.  Keep all follow-up appointments. SEEK MEDICAL CARE IF:  Your medicine is not helping to relieve pain and swelling. SEEK IMMEDIATE MEDICAL CARE IF:  Your arm is numb, cold, or pale, even when the splint is loose. MAKE SURE YOU:   Understand these instructions.  Will watch your condition.  Will get help right away if you are not doing well or get worse. Document Released: 11/06/2004 Document Revised: 02/01/2013 Document Reviewed: 12/20/2012 Exit[employer] Patient Information  2015 [employer]. This information is not intended to replace advice given to you by your health care provider. Make sure you discuss any questions you have with your health care provider.  Acetaminophen; Oxycodone tablets What is this medicine? ACETAMINOPHEN; OXYCODONE (a set a MEE noe fen; ox i KOE done) is a pain reliever. It is used to treat mild to moderate pain. This medicine may be used for other purposes; ask your health care provider or pharmacist if you have questions. COMMON BRAND NAME(S): Endocet, Magnacet, Narvox, Percocet, Perloxx, Primalev, Primlev,  Roxicet, Xolox What should I tell my health care provider before I take this medicine? They need to know if you have any of these conditions: -brain tumor -Crohn's disease, inflammatory bowel disease, or ulcerative colitis -drug abuse or addiction -head injury -heart or circulation problems -if you often drink alcohol -kidney disease or problems going to the bathroom -liver disease -lung disease, asthma, or breathing problems -an unusual or allergic reaction to acetaminophen, oxycodone, other opioid analgesics, other medicines, foods, dyes, or preservatives -pregnant or trying to get pregnant -breast-feeding How should I use this medicine? Take this medicine by mouth with a full glass of water. Follow the directions on the prescription label. Take your medicine at regular intervals. Do not take your medicine more often than directed. Talk to your pediatrician regarding the use of this medicine in children. Special care may be needed. Patients over 17 years old may have a stronger reaction and need a smaller dose. Overdosage: If you think you have taken too much of this medicine contact a poison control center or emergency room at once. NOTE: This medicine is only for you. Do not share this medicine with others. What if I miss a dose? If you miss a dose, take it as soon as you can. If it is almost time for your next dose, take only that dose. Do not take double or extra doses. What may interact with this medicine? -alcohol -antihistamines -barbiturates like amobarbital, butalbital, butabarbital, methohexital, pentobarbital, phenobarbital, thiopental, and secobarbital -benztropine -drugs for bladder problems like solifenacin, trospium, oxybutynin, tolterodine, hyoscyamine, and methscopolamine -drugs for breathing problems like ipratropium and tiotropium -drugs for certain stomach or intestine problems like propantheline, homatropine methylbromide, glycopyrrolate, atropine, belladonna, and  dicyclomine -general anesthetics like etomidate, ketamine, nitrous oxide, propofol, desflurane, enflurane, halothane, isoflurane, and sevoflurane -medicines for depression, anxiety, or psychotic disturbances -medicines for sleep -muscle relaxants -naltrexone -narcotic medicines (opiates) for pain -phenothiazines like perphenazine, thioridazine, chlorpromazine, mesoridazine, fluphenazine, prochlorperazine, promazine, and trifluoperazine -scopolamine -tramadol -trihexyphenidyl This list may not describe all possible interactions. Give your health care provider a list of all the medicines, herbs, non-prescription drugs, or dietary supplements you use. Also tell them if you smoke, drink alcohol, or use illegal drugs. Some items may interact with your medicine. What should I watch for while using this medicine? Tell your doctor or health care professional if your pain does not go away, if it gets worse, or if you have new or a different type of pain. You may develop tolerance to the medicine. Tolerance means that you will need a higher dose of the medication for pain relief. Tolerance is normal and is expected if you take this medicine for a long time. Do not suddenly stop taking your medicine because you may develop a severe reaction. Your body becomes used to the medicine. This does NOT mean you are addicted. Addiction is a behavior related to getting and using a drug for a non-medical reason. If you  have pain, you have a medical reason to take pain medicine. Your doctor will tell you how much medicine to take. If your doctor wants you to stop the medicine, the dose will be slowly lowered over time to avoid any side effects. You may get drowsy or dizzy. Do not drive, use machinery, or do anything that needs mental alertness until you know how this medicine affects you. Do not stand or sit up quickly, especially if you are an older patient. This reduces the risk of dizzy or fainting spells. Alcohol may  interfere with the effect of this medicine. Avoid alcoholic drinks. There are different types of narcotic medicines (opiates) for pain. If you take more than one type at the same time, you may have more side effects. Give your health care provider a list of all medicines you use. Your doctor will tell you how much medicine to take. Do not take more medicine than directed. Call emergency for help if you have problems breathing. The medicine will cause constipation. Try to have a bowel movement at least every 2 to 3 days. If you do not have a bowel movement for 3 days, call your doctor or health care professional. Do not take Tylenol (acetaminophen) or medicines that have acetaminophen with this medicine. Too much acetaminophen can be very dangerous. Many nonprescription medicines contain acetaminophen. Always read the labels carefully to avoid taking more acetaminophen. What side effects may I notice from receiving this medicine? Side effects that you should report to your doctor or health care professional as soon as possible: -allergic reactions like skin rash, itching or hives, swelling of the face, lips, or tongue -breathing difficulties, wheezing -confusion -light headedness or fainting spells -severe stomach pain -unusually weak or tired -yellowing of the skin or the whites of the eyes Side effects that usually do not require medical attention (report to your doctor or health care professional if they continue or are bothersome): -dizziness -drowsiness -nausea -vomiting This list may not describe all possible side effects. Call your doctor for medical advice about side effects. You may report side effects to FDA at 1-800-FDA-1088. Where should I keep my medicine? Keep out of the reach of children. This medicine can be abused. Keep your medicine in a safe place to protect it from theft. Do not share this medicine with anyone. Selling or giving away this medicine is dangerous and against the  law. Store at room temperature between 20 and 25 degrees C (68 and 77 degrees F). Keep container tightly closed. Protect from light. This medicine may cause accidental overdose and death if it is taken by other adults, children, or pets. Flush any unused medicine down the toilet to reduce the chance of harm. Do not use the medicine after the expiration date. NOTE: This sheet is a summary. It may not cover all possible information. If you have questions about this medicine, talk to your doctor, pharmacist, or health care provider.  2015, Elsevier/Gold Standard. (2012-09-20 13:17:35)  Td Vaccine (Tetanus and Diphtheria): What You Need to Know 1. Why get vaccinated? Tetanus  and diphtheria are very serious diseases. They are rare in the [place] today, but people who do become infected often have severe complications. Td vaccine is used to protect adolescents and adults from both of these diseases. Both tetanus and diphtheria are infections caused by bacteria. Diphtheria spreads from person to person through coughing or sneezing. Tetanus-causing bacteria enter the body through cuts, scratches, or wounds. TETANUS (Lockjaw) causes painful muscle tightening  and stiffness, usually all over the body.  It can lead to tightening of muscles in the head and neck so you can't open your mouth, swallow, or sometimes even breathe. Tetanus kills about 1 out of every 5 people who are infected. DIPHTHERIA can cause a thick coating to form in the back of the throat.  It can lead to breathing problems, paralysis, heart failure, and death. Before vaccines, the [place] saw as many as 200,000 cases a year of diphtheria and hundreds of cases of tetanus. Since vaccination began, cases of both diseases have dropped by about 99%. 2. Td vaccine Td vaccine can protect adolescents and adults from tetanus and diphtheria. Td is usually given as a booster dose every 10 years but it can also be given earlier after a  severe and dirty wound or burn. Your doctor can give you more information. Td may safely be given at the same time as other vaccines. 3. Some people should not get this vaccine  If you ever had a life-threatening allergic reaction after a dose of any tetanus or diphtheria containing vaccine, OR if you have a severe allergy to any part of this vaccine, you should not get Td. Tell your doctor if you have any severe allergies.  Talk to your doctor if you:  have epilepsy or another nervous system problem,  had severe pain or swelling after any vaccine containing diphtheria or tetanus,  ever had Guillain Barr Syndrome (GBS),  aren't feeling well on the day the shot is scheduled. 4. Risks of a vaccine reaction With a vaccine, like any medicine, there is a chance of side effects. These are usually mild and go away on their own. Serious side effects are also possible, but are very rare. Most people who get Td vaccine do not have any problems with it. Mild Problems  following Td (Did not interfere with activities)  Pain where the shot was given (about 8 people in 10)  Redness or swelling where the shot was given (about 1 person in 3)  Mild fever (about 1 person in 15)  Headache or Tiredness (uncommon) Moderate Problems following Td (Interfered with activities, but did not require medical attention)  Fever over 102F (rare) Severe Problems  following Td (Unable to perform usual activities; required medical attention)  Swelling, severe pain, bleeding and/or redness in the arm where the shot was given (rare). Problems that could happen after any vaccine:  Brief fainting spells can happen after any medical procedure, including vaccination. Sitting or lying down for about 15 minutes can help prevent fainting, and injuries caused by a fall. Tell your doctor if you feel dizzy, or have vision changes or ringing in the ears.  Severe shoulder pain and reduced range of motion in the arm where  a shot was given can happen, very rarely, after a vaccination.  Severe allergic reactions from a vaccine are very rare, estimated at less than 1 in a million doses. If one were to occur, it would usually be within a few minutes to a few hours after the vaccination. 5. What if there is a serious reaction? What should I look for?  Look for anything that concerns you, such as signs of a severe allergic reaction, very high fever, or behavior changes. Signs of a severe allergic reaction can include hives, swelling of the face and throat, difficulty breathing, a fast heartbeat, dizziness, and weakness. These would usually start a few minutes to a few hours after the vaccination. What should  I do?  If you think it is a severe allergic reaction or other emergency that can't wait, call 9-1-1 or get the person to the nearest hospital. Otherwise, call your doctor.  Afterward, the reaction should be reported to the Vaccine Adverse Event Reporting System (VAERS). Your doctor might file this report, or you can do it yourself through the VAERS web site at www.vaers.LAgents.no, or by calling 1-(361)446-8698. VAERS is only for reporting reactions. They do not give medical advice. 6. The National Vaccine Injury Compensation Program The [employer] Vaccine Injury Compensation Program (VICP) is a federal program that was created to compensate people who may have been injured by certain vaccines. Persons who believe they may have been injured by a vaccine can learn about the program and about filing a claim by calling 1-581-272-0945 or visiting the VICP website at SpiritualWord.at. 7. How can I learn more?  Ask your doctor.  Contact your local or state health department.  Contact the Centers for Disease Control and Prevention (CDC):  Call 7322010986 (1-800-CDC-INFO)  Visit CDC's website at PicCapture.uy CDC Td Vaccine Interim VIS (03/16/12) Document Released: 11/24/2005 Document Revised:  06/13/2013 Document Reviewed: 05/11/2013 [facility] Patient Information 2015 [place], [place]. This information is not intended to replace advice given to you by your health care provider. Make sure you discuss any questions you have with your health care provider.
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
## 2014-06-20 ENCOUNTER — Encounter (HOSPITAL_COMMUNITY): Payer: Self-pay | Admitting: Emergency Medicine

## 2014-06-20 ENCOUNTER — Emergency Department (HOSPITAL_COMMUNITY)
Admission: EM | Admit: 2014-06-20 | Discharge: 2014-06-20 | Disposition: A | Discharge status: Home / Self Care | Payer: Medicaid Other | Attending: Emergency Medicine | Admitting: Emergency Medicine

## 2014-06-20 ENCOUNTER — Emergency Department (HOSPITAL_COMMUNITY): Payer: Medicaid Other

## 2014-06-20 DIAGNOSIS — I25119 Atherosclerotic heart disease of native coronary artery with unspecified angina pectoris: Secondary | ICD-10-CM | POA: Diagnosis not present

## 2014-06-20 DIAGNOSIS — I10 Essential (primary) hypertension: Secondary | ICD-10-CM | POA: Diagnosis not present

## 2014-06-20 DIAGNOSIS — S42032D Displaced fracture of lateral end of left clavicle, subsequent encounter for fracture with routine healing: Secondary | ICD-10-CM

## 2014-06-20 DIAGNOSIS — Z79899 Other long term (current) drug therapy: Secondary | ICD-10-CM | POA: Insufficient documentation

## 2014-06-20 DIAGNOSIS — Z7982 Long term (current) use of aspirin: Secondary | ICD-10-CM | POA: Diagnosis not present

## 2014-06-20 DIAGNOSIS — M25512 Pain in left shoulder: Secondary | ICD-10-CM | POA: Diagnosis present

## 2014-06-20 DIAGNOSIS — J449 Chronic obstructive pulmonary disease, unspecified: Secondary | ICD-10-CM | POA: Diagnosis not present

## 2014-06-20 DIAGNOSIS — Z720 Tobacco use: Secondary | ICD-10-CM | POA: Insufficient documentation

## 2014-06-20 DIAGNOSIS — Z8669 Personal history of other diseases of the nervous system and sense organs: Secondary | ICD-10-CM | POA: Insufficient documentation

## 2014-06-20 DIAGNOSIS — I4891 Unspecified atrial fibrillation: Secondary | ICD-10-CM | POA: Diagnosis not present

## 2014-06-20 DIAGNOSIS — S42035D Nondisplaced fracture of lateral end of left clavicle, subsequent encounter for fracture with routine healing: Secondary | ICD-10-CM | POA: Insufficient documentation

## 2014-06-20 DIAGNOSIS — Z9889 Other specified postprocedural states: Secondary | ICD-10-CM | POA: Insufficient documentation

## 2014-06-20 DIAGNOSIS — I509 Heart failure, unspecified: Secondary | ICD-10-CM | POA: Insufficient documentation

## 2014-06-20 MED ORDER — OXYCODONE-ACETAMINOPHEN 5-325 MG PO TABS
2.0000 | ORAL_TABLET | Freq: Once | ORAL | Status: AC
  Administered 2014-06-20: 2 via ORAL
  Filled 2014-06-20: qty 2

## 2014-06-20 MED ORDER — HYDROCODONE-ACETAMINOPHEN 5-325 MG PO TABS: 1.0000 | ORAL_TABLET | Freq: Four times a day (QID) | ORAL | Status: DC | PRN

## 2014-06-20 MED ORDER — ONDANSETRON 4 MG PO TBDP
4.0000 mg | ORAL_TABLET | Freq: Once | ORAL | Status: AC
  Administered 2014-06-20: 4 mg via ORAL
  Filled 2014-06-20: qty 1

## 2014-06-20 MED ORDER — LIDOCAINE 5 % EX PTCH: 1.0000 | MEDICATED_PATCH | CUTANEOUS | Status: DC

## 2014-06-20 MED ORDER — ALBUTEROL SULFATE (2.5 MG/3ML) 0.083% IN NEBU
5.0000 mg | INHALATION_SOLUTION | Freq: Once | RESPIRATORY_TRACT | Status: AC
  Administered 2014-06-20: 5 mg via RESPIRATORY_TRACT
  Filled 2014-06-20: qty 6

## 2014-06-20 MED ORDER — IPRATROPIUM BROMIDE 0.02 % IN SOLN
0.5000 mg | Freq: Once | RESPIRATORY_TRACT | Status: AC
  Administered 2014-06-20: 0.5 mg via RESPIRATORY_TRACT
  Filled 2014-06-20: qty 2.5

## 2014-06-20 MED ORDER — ALBUTEROL SULFATE (2.5 MG/3ML) 0.083% IN NEBU
5.0000 mg | INHALATION_SOLUTION | Freq: Once | RESPIRATORY_TRACT | Status: DC
  Filled 2014-06-20 (×2): qty 6
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
## 2014-06-20 NOTE — Discharge Instructions (Signed)
Your XRAY shows that your fracture is healing well.  Your fracture does not appear to need surgery. Please fill and use the medications I have prescribed to help with your pain. Use the sling for comfort.
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
## 2014-08-31 ENCOUNTER — Encounter (HOSPITAL_COMMUNITY): Payer: Self-pay | Admitting: Emergency Medicine

## 2014-10-03 ENCOUNTER — Encounter: Payer: Medicaid Other | Admitting: Internal Medicine

## 2014-10-03 NOTE — Progress Notes (Signed)
This encounter was created in error - please disregard.

## 2014-10-04 ENCOUNTER — Encounter: Payer: Self-pay | Admitting: Internal Medicine

## 2014-12-11 ENCOUNTER — Telehealth: Payer: Self-pay | Admitting: Internal Medicine

## 2014-12-11 NOTE — Telephone Encounter (Signed)
Walk in pt form-pt questions about Loop Records-gave to [employer]

## 2014-12-14 ENCOUNTER — Encounter: Payer: Self-pay | Admitting: Internal Medicine

## 2014-12-14 ENCOUNTER — Ambulatory Visit (INDEPENDENT_AMBULATORY_CARE_PROVIDER_SITE_OTHER): Payer: Medicaid Other | Admitting: *Deleted

## 2014-12-14 DIAGNOSIS — R55 Syncope and collapse: Secondary | ICD-10-CM

## 2014-12-15 LAB — CUP PACEART INCLINIC DEVICE CHECK
Battery Remaining Longevity: 9 mo
Date Time Interrogation Session: 20161104135624
Pulse Gen Serial Number: 2846156

## 2014-12-15 NOTE — Progress Notes (Signed)
Loop check in clinic.  Pt with 0 tachy episodes; (3) brady episodes---undersensed PVCs, nocturnal brady; (23) asystole---undersensing PVCs/?4 bt NSVT/loss of telemetry; 0 symptom episodes. Plan to follow up with [facility] in 3 months.

## 2014-12-20 ENCOUNTER — Encounter: Payer: Self-pay | Admitting: *Deleted

## 2014-12-20 ENCOUNTER — Telehealth: Payer: Self-pay | Admitting: Internal Medicine

## 2014-12-20 NOTE — Telephone Encounter (Signed)
New Message  Cathleen Fears calling to see if pt can go through MRI on 12/22/14- per Arlene- pt already has clearance. Please call back and discuss.

## 2014-12-20 NOTE — Telephone Encounter (Signed)
Spoke with Arlene. Confirmed with [employer] that Confirm loop recorder is MRI safe. Will have document faxed to [facility] for clearance.  Fax [phone]

## 2014-12-22 ENCOUNTER — Telehealth: Payer: Self-pay | Admitting: Internal Medicine

## 2014-12-22 NOTE — Telephone Encounter (Signed)
New Message    Spoke with Johnny Rodriguez. She said that the [facility]  loop recorder was confirmed safe for MRI. She says that she has not revieved the document that was faxed to [facility] for clearance. Please fax   Fax [phone]

## 2014-12-22 NOTE — Telephone Encounter (Signed)
Johnny Rodriguez technologist re faxed.

## 2015-02-08 ENCOUNTER — Observation Stay (HOSPITAL_COMMUNITY): Payer: Medicaid Other

## 2015-02-08 ENCOUNTER — Inpatient Hospital Stay (HOSPITAL_COMMUNITY)
Admission: EM | Admit: 2015-02-08 | Discharge: 2015-02-11 | DRG: 189 | Disposition: A | Discharge status: Home / Self Care | Payer: Medicaid Other | Attending: Student in an Organized Health Care Education/Training Program | Admitting: Student in an Organized Health Care Education/Training Program

## 2015-02-08 ENCOUNTER — Emergency Department (HOSPITAL_COMMUNITY): Payer: Medicaid Other

## 2015-02-08 ENCOUNTER — Encounter (HOSPITAL_COMMUNITY): Payer: Self-pay | Admitting: Emergency Medicine

## 2015-02-08 DIAGNOSIS — K567 Ileus, unspecified: Secondary | ICD-10-CM

## 2015-02-08 DIAGNOSIS — I110 Hypertensive heart disease with heart failure: Secondary | ICD-10-CM | POA: Diagnosis present

## 2015-02-08 DIAGNOSIS — I480 Paroxysmal atrial fibrillation: Secondary | ICD-10-CM | POA: Diagnosis not present

## 2015-02-08 DIAGNOSIS — K5900 Constipation, unspecified: Secondary | ICD-10-CM | POA: Insufficient documentation

## 2015-02-08 DIAGNOSIS — I5022 Chronic systolic (congestive) heart failure: Secondary | ICD-10-CM | POA: Diagnosis not present

## 2015-02-08 DIAGNOSIS — Y9259 Other trade areas as the place of occurrence of the external cause: Secondary | ICD-10-CM

## 2015-02-08 DIAGNOSIS — Z823 Family history of stroke: Secondary | ICD-10-CM

## 2015-02-08 DIAGNOSIS — W57XXXA Bitten or stung by nonvenomous insect and other nonvenomous arthropods, initial encounter: Secondary | ICD-10-CM

## 2015-02-08 DIAGNOSIS — I252 Old myocardial infarction: Secondary | ICD-10-CM

## 2015-02-08 DIAGNOSIS — Z825 Family history of asthma and other chronic lower respiratory diseases: Secondary | ICD-10-CM

## 2015-02-08 DIAGNOSIS — J441 Chronic obstructive pulmonary disease with (acute) exacerbation: Secondary | ICD-10-CM

## 2015-02-08 DIAGNOSIS — I428 Other cardiomyopathies: Secondary | ICD-10-CM | POA: Diagnosis present

## 2015-02-08 DIAGNOSIS — E876 Hypokalemia: Secondary | ICD-10-CM | POA: Diagnosis present

## 2015-02-08 DIAGNOSIS — I444 Left anterior fascicular block: Secondary | ICD-10-CM | POA: Diagnosis present

## 2015-02-08 DIAGNOSIS — R0602 Shortness of breath: Secondary | ICD-10-CM

## 2015-02-08 DIAGNOSIS — R292 Abnormal reflex: Secondary | ICD-10-CM | POA: Diagnosis present

## 2015-02-08 DIAGNOSIS — N179 Acute kidney failure, unspecified: Secondary | ICD-10-CM | POA: Diagnosis present

## 2015-02-08 DIAGNOSIS — I255 Ischemic cardiomyopathy: Secondary | ICD-10-CM | POA: Diagnosis present

## 2015-02-08 DIAGNOSIS — N39498 Other specified urinary incontinence: Secondary | ICD-10-CM | POA: Diagnosis present

## 2015-02-08 DIAGNOSIS — T148 Other injury of unspecified body region: Secondary | ICD-10-CM | POA: Diagnosis not present

## 2015-02-08 DIAGNOSIS — G8929 Other chronic pain: Secondary | ICD-10-CM | POA: Diagnosis present

## 2015-02-08 DIAGNOSIS — S40862A Insect bite (nonvenomous) of left upper arm, initial encounter: Secondary | ICD-10-CM | POA: Diagnosis present

## 2015-02-08 DIAGNOSIS — S80861A Insect bite (nonvenomous), right lower leg, initial encounter: Secondary | ICD-10-CM | POA: Diagnosis present

## 2015-02-08 DIAGNOSIS — F17210 Nicotine dependence, cigarettes, uncomplicated: Secondary | ICD-10-CM | POA: Diagnosis present

## 2015-02-08 DIAGNOSIS — Z23 Encounter for immunization: Secondary | ICD-10-CM

## 2015-02-08 DIAGNOSIS — S40861A Insect bite (nonvenomous) of right upper arm, initial encounter: Secondary | ICD-10-CM | POA: Diagnosis present

## 2015-02-08 DIAGNOSIS — I5042 Chronic combined systolic (congestive) and diastolic (congestive) heart failure: Secondary | ICD-10-CM | POA: Diagnosis present

## 2015-02-08 DIAGNOSIS — F419 Anxiety disorder, unspecified: Secondary | ICD-10-CM | POA: Diagnosis present

## 2015-02-08 DIAGNOSIS — R258 Other abnormal involuntary movements: Secondary | ICD-10-CM | POA: Diagnosis present

## 2015-02-08 DIAGNOSIS — J9601 Acute respiratory failure with hypoxia: Principal | ICD-10-CM | POA: Diagnosis present

## 2015-02-08 DIAGNOSIS — I2510 Atherosclerotic heart disease of native coronary artery without angina pectoris: Secondary | ICD-10-CM | POA: Diagnosis present

## 2015-02-08 DIAGNOSIS — I493 Ventricular premature depolarization: Secondary | ICD-10-CM | POA: Diagnosis not present

## 2015-02-08 DIAGNOSIS — Z8249 Family history of ischemic heart disease and other diseases of the circulatory system: Secondary | ICD-10-CM

## 2015-02-08 DIAGNOSIS — M25512 Pain in left shoulder: Secondary | ICD-10-CM | POA: Diagnosis present

## 2015-02-08 DIAGNOSIS — S80862A Insect bite (nonvenomous), left lower leg, initial encounter: Secondary | ICD-10-CM | POA: Diagnosis present

## 2015-02-08 DIAGNOSIS — Z7901 Long term (current) use of anticoagulants: Secondary | ICD-10-CM

## 2015-02-08 DIAGNOSIS — Z7982 Long term (current) use of aspirin: Secondary | ICD-10-CM

## 2015-02-08 DIAGNOSIS — I10 Essential (primary) hypertension: Secondary | ICD-10-CM | POA: Diagnosis present

## 2015-02-08 HISTORY — DX: Nicotine dependence, cigarettes, uncomplicated: F17.210

## 2015-02-08 LAB — CBC WITH DIFFERENTIAL/PLATELET
Basophils Absolute: 0 10*3/uL (ref 0.0–0.1)
Basophils Relative: 0 %
Eosinophils Absolute: 0 10*3/uL (ref 0.0–0.7)
Eosinophils Relative: 0 %
HCT: 45.3 % (ref 39.0–52.0)
Hemoglobin: 15.2 g/dL (ref 13.0–17.0)
Lymphocytes Relative: 11 %
Lymphs Abs: 1.1 10*3/uL (ref 0.7–4.0)
MCH: 31.4 pg (ref 26.0–34.0)
MCHC: 33.6 g/dL (ref 30.0–36.0)
MCV: 93.6 fL (ref 78.0–100.0)
Monocytes Absolute: 0.9 10*3/uL (ref 0.1–1.0)
Monocytes Relative: 9 %
Neutro Abs: 8.1 10*3/uL — ABNORMAL HIGH (ref 1.7–7.7)
Neutrophils Relative %: 80 %
Platelets: 209 10*3/uL (ref 150–400)
RBC: 4.84 MIL/uL (ref 4.22–5.81)
RDW: 13.5 % (ref 11.5–15.5)
WBC: 10.2 10*3/uL (ref 4.0–10.5)

## 2015-02-08 LAB — BASIC METABOLIC PANEL
Anion gap: 13 (ref 5–15)
BUN: 18 mg/dL (ref 6–20)
CO2: 25 mmol/L (ref 22–32)
Calcium: 9.5 mg/dL (ref 8.9–10.3)
Chloride: 97 mmol/L — ABNORMAL LOW (ref 101–111)
Creatinine, Ser: 1.42 mg/dL — ABNORMAL HIGH (ref 0.61–1.24)
GFR calc Af Amer: 60 mL/min — ABNORMAL LOW (ref >60)
GFR calc non Af Amer: 51 mL/min — ABNORMAL LOW (ref >60)
Glucose, Bld: 132 mg/dL — ABNORMAL HIGH (ref 65–99)
Potassium: 4.1 mmol/L (ref 3.5–5.1)
Sodium: 135 mmol/L (ref 135–145)

## 2015-02-08 LAB — TSH: TSH: 0.915 u[IU]/mL (ref 0.350–4.500)

## 2015-02-08 MED ORDER — SENNOSIDES-DOCUSATE SODIUM 8.6-50 MG PO TABS
2.0000 | ORAL_TABLET | Freq: Every day | ORAL | Status: DC
  Administered 2015-02-08 – 2015-02-10 (×3): 2 via ORAL
  Filled 2015-02-08 (×3): qty 2

## 2015-02-08 MED ORDER — IPRATROPIUM-ALBUTEROL 0.5-2.5 (3) MG/3ML IN SOLN
RESPIRATORY_TRACT | Status: AC
  Filled 2015-02-08: qty 3

## 2015-02-08 MED ORDER — INFLUENZA VAC SPLIT QUAD 0.5 ML IM SUSY
0.5000 mL | PREFILLED_SYRINGE | INTRAMUSCULAR | Status: AC
  Administered 2015-02-09: 0.5 mL via INTRAMUSCULAR

## 2015-02-08 MED ORDER — METHYLPREDNISOLONE SODIUM SUCC 125 MG IJ SOLR
125.0000 mg | Freq: Once | INTRAMUSCULAR | Status: AC
  Administered 2015-02-08: 125 mg via INTRAVENOUS
  Filled 2015-02-08: qty 2

## 2015-02-08 MED ORDER — AZITHROMYCIN 500 MG IV SOLR
250.0000 mg | INTRAVENOUS | Status: DC
Start: 2015-02-09 — End: 2015-02-10
  Administered 2015-02-09 – 2015-02-10 (×2): 250 mg via INTRAVENOUS
  Filled 2015-02-08 (×2): qty 250

## 2015-02-08 MED ORDER — ACETAMINOPHEN 325 MG PO TABS: 650.0000 mg | ORAL_TABLET | Freq: Four times a day (QID) | ORAL | Status: DC | PRN

## 2015-02-08 MED ORDER — IPRATROPIUM-ALBUTEROL 0.5-2.5 (3) MG/3ML IN SOLN: 3.0000 mL | RESPIRATORY_TRACT | Status: DC

## 2015-02-08 MED ORDER — MOMETASONE FURO-FORMOTEROL FUM 100-5 MCG/ACT IN AERO
2.0000 | INHALATION_SPRAY | Freq: Two times a day (BID) | RESPIRATORY_TRACT | Status: DC
Start: 2015-02-08 — End: 2015-02-11
  Administered 2015-02-08 – 2015-02-11 (×5): 2 via RESPIRATORY_TRACT
  Filled 2015-02-08 (×3): qty 8.8

## 2015-02-08 MED ORDER — ONDANSETRON HCL 4 MG/2ML IJ SOLN: 4.0000 mg | Freq: Four times a day (QID) | INTRAMUSCULAR | Status: DC | PRN

## 2015-02-08 MED ORDER — ENOXAPARIN SODIUM 40 MG/0.4ML ~~LOC~~ SOLN
40.0000 mg | Freq: Every day | SUBCUTANEOUS | Status: DC
  Administered 2015-02-08 – 2015-02-09 (×2): 40 mg via SUBCUTANEOUS
  Filled 2015-02-08 (×2): qty 0.4

## 2015-02-08 MED ORDER — ALBUTEROL (5 MG/ML) CONTINUOUS INHALATION SOLN
10.0000 mg/h | INHALATION_SOLUTION | Freq: Once | RESPIRATORY_TRACT | Status: AC
  Administered 2015-02-08: 10 mg/h via RESPIRATORY_TRACT
  Filled 2015-02-08: qty 20

## 2015-02-08 MED ORDER — AMIODARONE HCL 200 MG PO TABS
200.0000 mg | ORAL_TABLET | Freq: Every day | ORAL | Status: DC
  Administered 2015-02-08 – 2015-02-11 (×4): 200 mg via ORAL
  Filled 2015-02-08 (×4): qty 1

## 2015-02-08 MED ORDER — DEXTROSE 5 % IV SOLN
500.0000 mg | Freq: Once | INTRAVENOUS | Status: AC
  Administered 2015-02-08: 500 mg via INTRAVENOUS
  Filled 2015-02-08: qty 500

## 2015-02-08 MED ORDER — ALBUTEROL SULFATE (2.5 MG/3ML) 0.083% IN NEBU
5.0000 mg | INHALATION_SOLUTION | Freq: Once | RESPIRATORY_TRACT | Status: AC
  Administered 2015-02-08: 5 mg via RESPIRATORY_TRACT
  Filled 2015-02-08: qty 6

## 2015-02-08 MED ORDER — METHYLPREDNISOLONE SODIUM SUCC 125 MG IJ SOLR: 125.0000 mg | Freq: Four times a day (QID) | INTRAMUSCULAR | Status: DC

## 2015-02-08 MED ORDER — ACETAMINOPHEN 650 MG RE SUPP: 650.0000 mg | Freq: Four times a day (QID) | RECTAL | Status: DC | PRN

## 2015-02-08 MED ORDER — METHYLPREDNISOLONE SODIUM SUCC 125 MG IJ SOLR
125.0000 mg | Freq: Four times a day (QID) | INTRAMUSCULAR | Status: DC
  Administered 2015-02-08 – 2015-02-09 (×3): 125 mg via INTRAVENOUS
  Filled 2015-02-08 (×3): qty 2

## 2015-02-08 MED ORDER — IPRATROPIUM-ALBUTEROL 0.5-2.5 (3) MG/3ML IN SOLN
3.0000 mL | RESPIRATORY_TRACT | Status: DC
  Administered 2015-02-08 – 2015-02-09 (×5): 3 mL via RESPIRATORY_TRACT
  Filled 2015-02-08 (×5): qty 3

## 2015-02-08 MED ORDER — SODIUM CHLORIDE 0.9 % IJ SOLN
3.0000 mL | Freq: Two times a day (BID) | INTRAMUSCULAR | Status: DC
Start: 2015-02-08 — End: 2015-02-11
  Administered 2015-02-08 – 2015-02-10 (×5): 3 mL via INTRAVENOUS

## 2015-02-08 MED ORDER — ASPIRIN EC 81 MG PO TBEC
81.0000 mg | DELAYED_RELEASE_TABLET | Freq: Every day | ORAL | Status: DC
  Administered 2015-02-08 – 2015-02-09 (×2): 81 mg via ORAL
  Filled 2015-02-08 (×2): qty 1

## 2015-02-08 MED ORDER — ALBUTEROL SULFATE (2.5 MG/3ML) 0.083% IN NEBU
5.0000 mg | INHALATION_SOLUTION | RESPIRATORY_TRACT | Status: DC | PRN
Start: 2015-02-08 — End: 2015-02-11
  Administered 2015-02-09 (×2): 5 mg via RESPIRATORY_TRACT
  Filled 2015-02-08 (×4): qty 6

## 2015-02-08 MED ORDER — MORPHINE SULFATE (PF) 4 MG/ML IV SOLN
4.0000 mg | Freq: Once | INTRAVENOUS | Status: AC
  Administered 2015-02-08: 4 mg via INTRAVENOUS
  Filled 2015-02-08: qty 1

## 2015-02-08 MED ORDER — FUROSEMIDE 40 MG PO TABS
40.0000 mg | ORAL_TABLET | Freq: Every day | ORAL | Status: DC
  Administered 2015-02-08 – 2015-02-11 (×4): 40 mg via ORAL
  Filled 2015-02-08 (×4): qty 1

## 2015-02-08 MED ORDER — METOPROLOL SUCCINATE ER 50 MG PO TB24
50.0000 mg | ORAL_TABLET | Freq: Every day | ORAL | Status: DC
  Administered 2015-02-09 – 2015-02-11 (×3): 50 mg via ORAL
  Filled 2015-02-08 (×3): qty 1

## 2015-02-08 MED ORDER — DABIGATRAN ETEXILATE MESYLATE 150 MG PO CAPS
150.0000 mg | ORAL_CAPSULE | Freq: Two times a day (BID) | ORAL | Status: DC
  Filled 2015-02-08 (×2): qty 1

## 2015-02-08 MED ORDER — HYDROCODONE-ACETAMINOPHEN 5-325 MG PO TABS
1.0000 | ORAL_TABLET | Freq: Four times a day (QID) | ORAL | Status: DC | PRN
  Administered 2015-02-09: 1 via ORAL
  Administered 2015-02-09: 2 via ORAL
  Administered 2015-02-10 (×2): 1 via ORAL
  Filled 2015-02-08 (×2): qty 1
  Filled 2015-02-08: qty 2
  Filled 2015-02-08: qty 1

## 2015-02-08 MED ORDER — ONDANSETRON HCL 4 MG PO TABS: 4.0000 mg | ORAL_TABLET | Freq: Four times a day (QID) | ORAL | Status: DC | PRN
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
## 2015-02-08 NOTE — Progress Notes (Signed)
Pt has been admitted to the unit. Pt presents with bed bugs. Pt placed on contact precautions. Pt belongings have been double bagged and taped. Pt has no family. Pt is from [place]. Pt has bed bug bites on both legs, buttocks, bilateral wrists and inside of both arms, and back. Pt has been bathed and hair shampooed. Pt also has a right broken arm.
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
## 2015-02-08 NOTE — Progress Notes (Signed)
Pharmacy: [facility] for VTE px Pt not taking pradaxa PTA, no doses administered. Plan: [facility] 40 mg sq q24 Pharmacy to sign off Thanks Herby Abraham, Pharm.D. [phone] 02/08/2015 11:01 AM

## 2015-02-08 NOTE — ED Notes (Signed)
Pt. reports chronic left shoulder pain radiating to posterior neck and upper back for several months , ran out of prescription pain medications last week .
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
## 2015-02-08 NOTE — ED Notes (Signed)
Breakfast tray ordered 

## 2015-02-09 ENCOUNTER — Observation Stay (HOSPITAL_COMMUNITY): Payer: Medicaid Other

## 2015-02-09 DIAGNOSIS — Z825 Family history of asthma and other chronic lower respiratory diseases: Secondary | ICD-10-CM | POA: Diagnosis not present

## 2015-02-09 DIAGNOSIS — I5022 Chronic systolic (congestive) heart failure: Secondary | ICD-10-CM | POA: Diagnosis present

## 2015-02-09 DIAGNOSIS — W57XXXA Bitten or stung by nonvenomous insect and other nonvenomous arthropods, initial encounter: Secondary | ICD-10-CM

## 2015-02-09 DIAGNOSIS — I110 Hypertensive heart disease with heart failure: Secondary | ICD-10-CM | POA: Diagnosis present

## 2015-02-09 DIAGNOSIS — G8929 Other chronic pain: Secondary | ICD-10-CM | POA: Diagnosis present

## 2015-02-09 DIAGNOSIS — S80861A Insect bite (nonvenomous), right lower leg, initial encounter: Secondary | ICD-10-CM | POA: Diagnosis present

## 2015-02-09 DIAGNOSIS — R258 Other abnormal involuntary movements: Secondary | ICD-10-CM | POA: Diagnosis present

## 2015-02-09 DIAGNOSIS — N39498 Other specified urinary incontinence: Secondary | ICD-10-CM | POA: Diagnosis present

## 2015-02-09 DIAGNOSIS — T148 Other injury of unspecified body region: Secondary | ICD-10-CM | POA: Diagnosis not present

## 2015-02-09 DIAGNOSIS — K5900 Constipation, unspecified: Secondary | ICD-10-CM | POA: Diagnosis present

## 2015-02-09 DIAGNOSIS — E876 Hypokalemia: Secondary | ICD-10-CM | POA: Diagnosis present

## 2015-02-09 DIAGNOSIS — Z8249 Family history of ischemic heart disease and other diseases of the circulatory system: Secondary | ICD-10-CM | POA: Diagnosis not present

## 2015-02-09 DIAGNOSIS — J441 Chronic obstructive pulmonary disease with (acute) exacerbation: Secondary | ICD-10-CM | POA: Diagnosis present

## 2015-02-09 DIAGNOSIS — I252 Old myocardial infarction: Secondary | ICD-10-CM | POA: Diagnosis not present

## 2015-02-09 DIAGNOSIS — Z7901 Long term (current) use of anticoagulants: Secondary | ICD-10-CM | POA: Diagnosis not present

## 2015-02-09 DIAGNOSIS — I480 Paroxysmal atrial fibrillation: Secondary | ICD-10-CM | POA: Diagnosis present

## 2015-02-09 DIAGNOSIS — S40861A Insect bite (nonvenomous) of right upper arm, initial encounter: Secondary | ICD-10-CM | POA: Diagnosis present

## 2015-02-09 DIAGNOSIS — Y9259 Other trade areas as the place of occurrence of the external cause: Secondary | ICD-10-CM | POA: Diagnosis not present

## 2015-02-09 DIAGNOSIS — I428 Other cardiomyopathies: Secondary | ICD-10-CM | POA: Diagnosis present

## 2015-02-09 DIAGNOSIS — J9601 Acute respiratory failure with hypoxia: Secondary | ICD-10-CM | POA: Diagnosis present

## 2015-02-09 DIAGNOSIS — R292 Abnormal reflex: Secondary | ICD-10-CM | POA: Diagnosis present

## 2015-02-09 DIAGNOSIS — M25512 Pain in left shoulder: Secondary | ICD-10-CM | POA: Diagnosis present

## 2015-02-09 DIAGNOSIS — Z823 Family history of stroke: Secondary | ICD-10-CM | POA: Diagnosis not present

## 2015-02-09 DIAGNOSIS — R0602 Shortness of breath: Secondary | ICD-10-CM | POA: Diagnosis present

## 2015-02-09 DIAGNOSIS — N179 Acute kidney failure, unspecified: Secondary | ICD-10-CM | POA: Diagnosis present

## 2015-02-09 DIAGNOSIS — I2510 Atherosclerotic heart disease of native coronary artery without angina pectoris: Secondary | ICD-10-CM | POA: Diagnosis present

## 2015-02-09 DIAGNOSIS — Z7982 Long term (current) use of aspirin: Secondary | ICD-10-CM | POA: Diagnosis not present

## 2015-02-09 DIAGNOSIS — F17210 Nicotine dependence, cigarettes, uncomplicated: Secondary | ICD-10-CM | POA: Diagnosis present

## 2015-02-09 DIAGNOSIS — F419 Anxiety disorder, unspecified: Secondary | ICD-10-CM | POA: Diagnosis present

## 2015-02-09 DIAGNOSIS — I255 Ischemic cardiomyopathy: Secondary | ICD-10-CM | POA: Diagnosis present

## 2015-02-09 DIAGNOSIS — S40862A Insect bite (nonvenomous) of left upper arm, initial encounter: Secondary | ICD-10-CM | POA: Diagnosis present

## 2015-02-09 DIAGNOSIS — I444 Left anterior fascicular block: Secondary | ICD-10-CM | POA: Diagnosis present

## 2015-02-09 DIAGNOSIS — S80862A Insect bite (nonvenomous), left lower leg, initial encounter: Secondary | ICD-10-CM | POA: Diagnosis present

## 2015-02-09 DIAGNOSIS — Z23 Encounter for immunization: Secondary | ICD-10-CM | POA: Diagnosis not present

## 2015-02-09 DIAGNOSIS — I493 Ventricular premature depolarization: Secondary | ICD-10-CM | POA: Diagnosis not present

## 2015-02-09 DIAGNOSIS — I10 Essential (primary) hypertension: Secondary | ICD-10-CM

## 2015-02-09 LAB — COMPREHENSIVE METABOLIC PANEL
ALT: 16 U/L — ABNORMAL LOW (ref 17–63)
AST: 23 U/L (ref 15–41)
Albumin: 3.8 g/dL (ref 3.5–5.0)
Alkaline Phosphatase: 70 U/L (ref 38–126)
Anion gap: 13 (ref 5–15)
BUN: 22 mg/dL — ABNORMAL HIGH (ref 6–20)
CO2: 26 mmol/L (ref 22–32)
Calcium: 9.5 mg/dL (ref 8.9–10.3)
Chloride: 99 mmol/L — ABNORMAL LOW (ref 101–111)
Creatinine, Ser: 1.32 mg/dL — ABNORMAL HIGH (ref 0.61–1.24)
GFR calc Af Amer: >60 mL/min (ref >60)
GFR calc non Af Amer: 56 mL/min — ABNORMAL LOW (ref >60)
Glucose, Bld: 175 mg/dL — ABNORMAL HIGH (ref 65–99)
Potassium: 3.1 mmol/L — ABNORMAL LOW (ref 3.5–5.1)
Sodium: 138 mmol/L (ref 135–145)
Total Bilirubin: 0.8 mg/dL (ref 0.3–1.2)
Total Protein: 7.2 g/dL (ref 6.5–8.1)

## 2015-02-09 LAB — PROTIME-INR
INR: 1.14 (ref 0.00–1.49)
Prothrombin Time: 14.8 seconds (ref 11.6–15.2)

## 2015-02-09 LAB — HIV ANTIBODY (ROUTINE TESTING W REFLEX): HIV Screen 4th Generation wRfx: NONREACTIVE

## 2015-02-09 LAB — MAGNESIUM: Magnesium: 2 mg/dL (ref 1.7–2.4)

## 2015-02-09 MED ORDER — FLEET ENEMA 7-19 GM/118ML RE ENEM
1.0000 | ENEMA | Freq: Once | RECTAL | Status: AC
  Administered 2015-02-09: 1 via RECTAL
  Filled 2015-02-09: qty 1

## 2015-02-09 MED ORDER — DABIGATRAN ETEXILATE MESYLATE 150 MG PO CAPS
150.0000 mg | ORAL_CAPSULE | Freq: Two times a day (BID) | ORAL | Status: DC
  Administered 2015-02-09 – 2015-02-11 (×5): 150 mg via ORAL
  Filled 2015-02-09 (×7): qty 1

## 2015-02-09 MED ORDER — DM-GUAIFENESIN ER 30-600 MG PO TB12
1.0000 | ORAL_TABLET | Freq: Two times a day (BID) | ORAL | Status: DC
  Administered 2015-02-09 – 2015-02-11 (×6): 1 via ORAL
  Filled 2015-02-09 (×5): qty 1
  Filled 2015-02-09: qty 2

## 2015-02-09 MED ORDER — IPRATROPIUM-ALBUTEROL 0.5-2.5 (3) MG/3ML IN SOLN
3.0000 mL | Freq: Two times a day (BID) | RESPIRATORY_TRACT | Status: DC
  Administered 2015-02-09 – 2015-02-11 (×4): 3 mL via RESPIRATORY_TRACT
  Filled 2015-02-09 (×5): qty 3

## 2015-02-09 MED ORDER — PANTOPRAZOLE SODIUM 40 MG PO TBEC
40.0000 mg | DELAYED_RELEASE_TABLET | Freq: Every day | ORAL | Status: DC
  Administered 2015-02-09 – 2015-02-11 (×3): 40 mg via ORAL
  Filled 2015-02-09 (×4): qty 1

## 2015-02-09 MED ORDER — POTASSIUM CHLORIDE CRYS ER 20 MEQ PO TBCR
40.0000 meq | EXTENDED_RELEASE_TABLET | Freq: Once | ORAL | Status: AC
  Administered 2015-02-09: 40 meq via ORAL
  Filled 2015-02-09: qty 2

## 2015-02-09 MED ORDER — NICOTINE 14 MG/24HR TD PT24
14.0000 mg | MEDICATED_PATCH | Freq: Every day | TRANSDERMAL | Status: DC
  Administered 2015-02-09 – 2015-02-10 (×2): 14 mg via TRANSDERMAL
  Filled 2015-02-09 (×3): qty 1

## 2015-02-09 MED ORDER — PREDNISONE 20 MG PO TABS
40.0000 mg | ORAL_TABLET | Freq: Every day | ORAL | Status: DC
  Administered 2015-02-09: 40 mg via ORAL
  Filled 2015-02-09: qty 2

## 2015-02-09 NOTE — Clinical Documentation Improvement (Signed)
Internal Medicine  Can the diagnosis of Hypoxia be further specified? Please document findings in next progress note; NOT in BPA drop down box. Thank you!   Other  Clinically Undetermined  Supporting Information:  "He was hypoxic on admission to 87% requiring supplemental oxygen (initially mask and then [place]) and now on 4L with SpO2 of 90%" documented in 12/29 H&P.   Increased work of breathing also noted in H&P  Heart Rate > 90, Respiratory Rate > 24 with oxygen saturations down ranging from  84 - 88% in room air  Placed in 4 to 15L's of FiO2  Please exercise your independent, professional judgment when responding. A specific answer is not anticipated or expected.  Thank You, Shellee Milo RN, BSN, CCDS Health Information Management [place] [phone]; Cell: [phone]

## 2015-02-09 NOTE — Progress Notes (Addendum)
Paged by RN for worsening respiratory distress for which rapid response was called. Upon arrival, patient was sitting on bedside and talking comfortably with nursing staff. Per respiratory therapist, it appeared his mask may been attached to the wrong device.  On conversation, patient was coughing during conversation though was able to respond appropriately. He also complained of some bloating and regurgitation-like symptoms which he attributes to reflux. Pulmonary exam was notable for wheezing in all 4 lung fields with coarse breath sounds.  We will order Mucinex and Protonix for the patient to take as needed along with nicotine patch since he reports smoking >1 pack/day. Chest x-ray without acute findings which is reassuring. We will continue monitoring his respiratory status.

## 2015-02-09 NOTE — Progress Notes (Signed)
Called to pts room, RN stated that she had placed patient on Non rebreathing mask and his sats remained in the 80s. Upon arrival, pts NRB was attached to a humidifier bottle on the oxygen flow meter. Water bottle was removed and sats rebounded to 98% Pt then assessed, wheezing throughout. RN stated that she had just given PRN albuterol treatment. Pt then placed on 50% venti mask, sats remained stable. Rapid response Nurse, Nehemiah Settle and MD, Heywood Iles,  at bedside. Chest Xray pending.

## 2015-02-09 NOTE — Progress Notes (Signed)
Pt c/o burning sensation around throat and mouth. When it subsides it burns in his stomach area. RN paged on call MD for further orders.   Will continue to monitor.   Johnny Rodriguez

## 2015-02-09 NOTE — Progress Notes (Signed)
Call received per floor RN  At 0145 regarding severe SOB just placed on VM 50% and received Albuterol NEB. Per floor RN Pt po2 sat 84-86 % on VM, advised to place Pt on NRB and page Resident MD. CXR order placed per protocol. Upon my arrival at 0151 Pt found resting in bed on NRB, 02 sats initially 90-93% on NRB within 5 minutes of my arrival Pt po2 sats 100% after adjustment of mask. Pt resting on side of bed alert oriented, with some dyspnea but speaking in full sentences. Lung sounds course with wheezes in all lobes. Pt returned to VM 50% with po2 sats 92-96% Resident team at bedside 0202 to assess Patient, agreed with STAT port CXR. Suggested mucinex and anti anxiety med, Pt with recent history of ETOH and Nicotine abuse. Resident Dr. Allena Katz to place orders and await CXR results.

## 2015-02-09 NOTE — Progress Notes (Addendum)
Pt became SOB and sats dipped down to 84%on 4L. Pt noted to be mouth breathing and states "my nose is too clogged up to breathe through". RN applied venturi mask on continuous flow without water and increased it to 10L and 45%. O2 sats as high as 92%, but vary. Pt appears to have increased breathing effort. PRN breathing treatment given.   Will continue to monitor.   Edgardo Roys

## 2015-02-09 NOTE — Progress Notes (Signed)
Pt walked 40 ft in room with 4L O2 sat stayed above 90%

## 2015-02-09 NOTE — Progress Notes (Signed)
Rt Note: Patient is ordered [facility] but I am unable to locate the inhaler. A notification was sent to pharmacy for a replacement. Pharmacy called the patients RN and stated that they were'nt going to send another inhaler and that it needs to be located. Rt again attempted to locate it but I have been unsuccessful in finding it on the unit. Rt will continue to monitor.

## 2015-02-09 NOTE — Progress Notes (Signed)
Pt had run of atrial fib in 140s. HR did not sustain and is now SR in 90s with frequent PVC. Pt asymptomatic and stated he did not feel it.   Will continue to monitor.   Edgardo Roys

## 2015-02-09 NOTE — Progress Notes (Signed)
Pt respiratory status continued to decline. Increased venturi on continuous flow meter without water humidifier. Pt desating 84%. RN then applied Non rebreather to continuous flow without water humidifer. Pt increased 96% O2 on non rebreather.  MD paged. PRN treatment already given. Rapid called and ordered chest x-ray stat. Pt working very hard to breath and severe productive cough. Pt then weaned back to venturi 12L 50% FiO2 and sating 92%. Pt appeared very anxious and breathing 28 x/min.   MD at the bedside 0200.   During MD visit pt confirmed he has been smoking 10 packs per day until his admission to the hospital. Pt also states he quit "alcohol" prior to coming in. Pt was unclear about amount he drank ,but admitted it has been beer and liquor.   Pt now resting comfortably in the bed and is sating 92% on 12L 50% FiO2. RN will continue to monitor.   Edgardo Roys

## 2015-02-09 NOTE — Progress Notes (Signed)
Patient ID: Johnny Rodriguez, male   DOB: 11/02/1950, 64 y.o.   MRN: [id number] Medicine attending: I examined this patient this morning together with resident physician Dr. John Giovanni and I concur with her evaluation and management plan which we discussed together. He has persistent dyspnea and end expiratory wheezes over the upper lung fields. Subjectively he feels better than yesterday with the addition of oxygen, steroids, and more intensive bronchodilators. Nurses observed that he is a mouth breather and changed him from a nasal cannula to a Venturi oxygen mask. Oxygen saturations have dipped as low as 84%. He is receiving empiric azithromycin but I think that we have per merrily seeing the evolution of his underlying tobacco-related airway disease. A rectal exam revealed normal sphincter tone. It is unclear why we were able to elicit clonus at the patellar reflexes bilaterally. He remains constipated and we will intensify his laxative regimen. He is hypokalemic and we will replace orally.

## 2015-02-09 NOTE — Progress Notes (Signed)
Notified MD of K this am is 3.1. Pt asymptomatic currently.   Will continue to monitor.   Edgardo Roys
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
## 2015-02-10 DIAGNOSIS — K5900 Constipation, unspecified: Secondary | ICD-10-CM | POA: Insufficient documentation

## 2015-02-10 LAB — BASIC METABOLIC PANEL
Anion gap: 12 (ref 5–15)
BUN: 26 mg/dL — ABNORMAL HIGH (ref 6–20)
CO2: 28 mmol/L (ref 22–32)
Calcium: 9.4 mg/dL (ref 8.9–10.3)
Chloride: 101 mmol/L (ref 101–111)
Creatinine, Ser: 1.06 mg/dL (ref 0.61–1.24)
GFR calc Af Amer: >60 mL/min (ref >60)
GFR calc non Af Amer: >60 mL/min (ref >60)
Glucose, Bld: 127 mg/dL — ABNORMAL HIGH (ref 65–99)
Potassium: 4 mmol/L (ref 3.5–5.1)
Sodium: 141 mmol/L (ref 135–145)

## 2015-02-10 LAB — HCV COMMENT:

## 2015-02-10 LAB — HEPATITIS C ANTIBODY (REFLEX): HCV Ab: <0.1 s/co ratio (ref 0.0–0.9)

## 2015-02-10 MED ORDER — MAGNESIUM CITRATE PO SOLN: 1.0000 | Freq: Once | ORAL | Status: DC

## 2015-02-10 MED ORDER — PREDNISONE 20 MG PO TABS
40.0000 mg | ORAL_TABLET | Freq: Once | ORAL | Status: AC
  Administered 2015-02-10: 40 mg via ORAL
  Filled 2015-02-10: qty 2

## 2015-02-10 MED ORDER — METHYLPREDNISOLONE SODIUM SUCC 125 MG IJ SOLR
60.0000 mg | Freq: Two times a day (BID) | INTRAMUSCULAR | Status: DC
Start: 2015-02-10 — End: 2015-02-10
  Administered 2015-02-10: 60 mg via INTRAVENOUS
  Filled 2015-02-10: qty 2

## 2015-02-10 MED ORDER — MAGNESIUM CITRATE PO SOLN
1.0000 | Freq: Once | ORAL | Status: AC
  Administered 2015-02-10: 1 via ORAL
  Filled 2015-02-10: qty 296

## 2015-02-10 MED ORDER — AZITHROMYCIN 250 MG PO TABS
250.0000 mg | ORAL_TABLET | Freq: Every day | ORAL | Status: DC
  Administered 2015-02-11: 250 mg via ORAL
  Filled 2015-02-10: qty 1

## 2015-02-10 MED ORDER — PREDNISONE 20 MG PO TABS
40.0000 mg | ORAL_TABLET | Freq: Every day | ORAL | Status: DC
  Administered 2015-02-11: 40 mg via ORAL
  Filled 2015-02-10: qty 2

## 2015-02-10 NOTE — Progress Notes (Signed)
CSW consulted for social concerns.  Pt has been living at hotel, the [place], and his room did have bed bugs- his room has been treated for bed bugs per patient report and he plans on returning to the hotel at time of DC  Pt was concerned about transportation and clothing for time of DC- Pt is throwing away his clothes to avoid re- contaminating his room and would like temporary clothing- CSW provided shirt, pants, sweatshirt, and socks  Pt also needs ride when ready for DC- CSW provided 3 bus passes and put in chart for when pt is ready for DC.  No further needs identified.  CSW signing off  Merlyn Lot, [place] Clinical Social Worker [phone]
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
## 2015-02-10 NOTE — Progress Notes (Signed)
Pt ambulated in hallway.  Pt began ambulation without oxygen.  Pt had removed oxygen.  Pt 88% on RA seated.  Pt ambulated 200 feet without oxygen satting 86-88%.  Pt placed on 2L oxygen, Pt continues to sat @ 88%.  Oxygen increased to 3L with no change in oxygen sat.  Oxygen increased to 4L and sat improved to 89-90%.  Pt ambulated total distance of 400 feet and tol well.  Pt denies SOB but does complain of some lightheadedness.  Placed Pt on oxygen @ 2L upon return to room.  Pt satting 90% on 2L in room sitting on bed.

## 2015-02-10 NOTE — Discharge Instructions (Signed)
Information on my medicine - Pradaxa (dabigatran)  This medication education was reviewed with me or my healthcare representative as part of my discharge preparation.    Why was Pradaxa prescribed for you? Pradaxa was prescribed for you to reduce the risk of forming blood clots that cause a stroke if you have a medical condition called atrial fibrillation (a type of irregular heartbeat).    What do you Need to know about PradAXa? Take your Pradaxa 150 mg TWICE DAILY - one capsule in the morning and one tablet in the evening with or without food.  It would be best to take the doses about the same time each day.  The capsules should not be broken, chewed or opened - they must be swallowed whole.  Do not store Pradaxa in other medication containers - once the bottle is opened the Pradaxa should be used within FOUR months; throw away any capsules that havent been by that time.  Take Pradaxa exactly as prescribed by your doctor.  DO NOT stop taking Pradaxa without talking to the doctor who prescribed the medication.  Stopping without other stroke prevention medication to take the place of Pradaxa may increase your risk of developing a clot that causes a stroke.  Refill your prescription before you run out.  After discharge, you should have regular check-up appointments with your healthcare provider that is prescribing your Pradaxa.  In the future your dose may need to be changed if your kidney function or weight changes by a significant amount.  What do you do if you miss a dose? If you miss a dose, take it as soon as you remember on the same day.  If your next dose is less than 6 hours away, skip the missed dose.  Do not take two doses of PRADAXA at the same time.  Important Safety Information A possible side effect of Pradaxa is bleeding. You should call your healthcare provider right away if you experience any of the following: ? Bleeding from an injury or your nose that does not  stop. ? Unusual colored urine (red or dark brown) or unusual colored stools (red or black). ? Unusual bruising for unknown reasons. ? A serious fall or if you hit your head (even if there is no bleeding).  Some medicines may interact with Pradaxa and might increase your risk of bleeding or clotting while on Pradaxa. To help avoid this, consult your healthcare provider or pharmacist prior to using any new prescription or non-prescription medications, including herbals, vitamins, non-steroidal anti-inflammatory drugs (NSAIDs) and supplements.  This website has more information on Pradaxa (dabigatran): [email or web address]://www.pradaxa.com

## 2015-02-11 MED ORDER — AZITHROMYCIN 250 MG PO TABS: 250.0000 mg | ORAL_TABLET | Freq: Once | ORAL | Status: DC

## 2015-02-11 MED ORDER — PREDNISONE 20 MG PO TABS
40.0000 mg | ORAL_TABLET | Freq: Once | ORAL | Status: DC
Start: 2015-02-12 — End: 2015-08-15

## 2015-02-11 MED ORDER — MOMETASONE FURO-FORMOTEROL FUM 100-5 MCG/ACT IN AERO: 2.0000 | INHALATION_SPRAY | Freq: Two times a day (BID) | RESPIRATORY_TRACT | Status: DC

## 2015-02-11 MED ORDER — LOSARTAN POTASSIUM 25 MG PO TABS
25.0000 mg | ORAL_TABLET | Freq: Every day | ORAL | Status: DC
  Administered 2015-02-11: 25 mg via ORAL
  Filled 2015-02-11: qty 1

## 2015-02-11 MED ORDER — PREDNISONE 20 MG PO TABS: 40.0000 mg | ORAL_TABLET | Freq: Once | ORAL | Status: DC

## 2015-02-11 NOTE — Care Management Note (Signed)
Case Management Note  Patient Details  Name: Johnny Rodriguez MRN: [id number] Date of Birth: 01/14/1951  Subjective/Objective:  65 y.o. M admitted 02/08/2015 with Hypoxic Respiratory Failure due to his COPD. Pt has has Oxygen saturations while ambulating of 86-88%. Made [facility] rep aware that MD has ordered Oxygen and Tank will need to be delivered prior to discharge.                   Action/Plan: Anticipate discharge home today after delivery of Oxygen.  No further CM needs but will be available should additional discharge needs arise.   Expected Discharge Date:  02/11/15               Expected Discharge Plan:  Home/Self Care  In-House Referral:     Discharge planning Services  CM Consult  Post Acute Care Choice:    Choice offered to:     DME Arranged:  Oxygen DME Agency:  [facility]  HH Arranged:    [facility] Agency:     Status of Service:  Completed, signed off  Medicare Important Message Given:    Date Medicare IM Given:    Medicare IM give by:    Date Additional Medicare IM Given:    Additional Medicare Important Message give by:     If discussed at Long Length of Stay Meetings, dates discussed:    Additional Comments:  Yvone Neu, RN 02/11/2015, 9:31 AM

## 2015-02-11 NOTE — Progress Notes (Signed)
SATURATION QUALIFICATIONS: (This note is used to comply with regulatory documentation for home oxygen)  Patient Saturations on Room Air at Rest = 91%  Patient Saturations on Room Air while Ambulating = 87%  Patient Saturations on 2 Liters of oxygen while Ambulating =  92%  Please briefly explain why patient needs home oxygen:yes, patient desats while ambulating.
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
## 2015-03-14 ENCOUNTER — Encounter: Payer: Self-pay | Admitting: Internal Medicine

## 2015-03-14 ENCOUNTER — Ambulatory Visit (INDEPENDENT_AMBULATORY_CARE_PROVIDER_SITE_OTHER): Payer: Medicaid Other | Admitting: Internal Medicine

## 2015-03-14 VITALS — BP 136/78 | HR 101 | Ht 71.0 in | Wt 166.2 lb

## 2015-03-14 DIAGNOSIS — R55 Syncope and collapse: Secondary | ICD-10-CM | POA: Diagnosis not present

## 2015-03-14 LAB — CUP PACEART INCLINIC DEVICE CHECK
Date Time Interrogation Session: 20170201181800
Pulse Gen Serial Number: 2846156

## 2015-03-14 MED ORDER — AMIODARONE HCL 200 MG PO TABS: 100.0000 mg | ORAL_TABLET | Freq: Every day | ORAL | Status: DC

## 2015-03-14 NOTE — Patient Instructions (Signed)
Medication Instructions:  Your physician has recommended you make the following change in your medication:  1) Decrease Amiodarone to 1/2 tablet daily    Labwork: None ordered   Testing/Procedures: None ordered   Follow-Up: Your physician recommends that you schedule a follow-up appointment in: 3 months with the device clinic and 6 months with Dr Ladona Ridgel   Any Other Special Instructions Will Be Listed Below (If Applicable).     If you need a refill on your cardiac medications before your next appointment, please call your pharmacy.

## 2015-03-14 NOTE — Progress Notes (Signed)
HPI [name] returns today for followup after a 2 year absence from our arrhythmias clinic. He is a pleasant 65 yo man with a h/o unexplained syncope, s/p ILR insertion. He has a h/o atrial fibrillation. In the interim, he has been stable but bothered by worsening shoulder pain and is considering surgery.  No recurrent syncope. No other symptoms. He has not had any syncope. He is now on oxygen and is still smoking. No Known Allergies   Current Outpatient Prescriptions  Medication Sig Dispense Refill  . amiodarone (PACERONE) 200 MG tablet Take 200 mg by mouth daily.      Marland Kitchen azithromycin (ZITHROMAX) 250 MG tablet Take 1 tablet (250 mg total) by mouth once. 1 each 0  . dabigatran (PRADAXA) 150 MG CAPS capsule Take 150 mg by mouth 2 (two) times daily.    . furosemide (LASIX) 40 MG tablet Take 40 mg by mouth daily.      Marland Kitchen HYDROcodone-acetaminophen (NORCO) 5-325 MG per tablet Take 1-2 tablets by mouth every 6 (six) hours as needed for moderate pain. 20 tablet 0  . ibuprofen (ADVIL,MOTRIN) 200 MG tablet Take 200 mg by mouth every 6 (six) hours as needed for moderate pain.    Marland Kitchen losartan (COZAAR) 25 MG tablet Take 25 mg by mouth daily.    . metoprolol (TOPROL-XL) 50 MG 24 hr tablet Take 50 mg by mouth daily.     . mometasone-formoterol (DULERA) 100-5 MCG/ACT AERO Inhale 2 puffs into the lungs 2 (two) times daily. 1 Inhaler 3  . predniSONE (DELTASONE) 20 MG tablet Take 2 tablets (40 mg total) by mouth once. 2 tablet 0  . PROAIR HFA 108 (90 Base) MCG/ACT inhaler Inhale 1 puff into the lungs every 6 (six) hours as needed for wheezing.   0  . spironolactone (ALDACTONE) 25 MG tablet Take 25 mg by mouth daily.     No current facility-administered medications for this visit.     Past Medical History  Diagnosis Date  . Cardiomyopathy, idiopathic (HCC)   . Non compliance with medical treatment   . Arrhythmia   . A-fib (HCC)   . Coronary artery disease   . Idiopathic cardiomyopathy (HCC) 07/16/2009  . CAD  (coronary artery disease) 12/15/2004  . Seizures (HCC)   . AF (atrial fibrillation) (HCC)   . COPD (chronic obstructive pulmonary disease) (HCC)   . Angina pectoris   . HTN (hypertension)   . Alcohol abuse   . CHF (congestive heart failure) (HCC)   . Heavy cigarette smoker     ROS:   All systems reviewed and negative except as noted in the HPI.   Past Surgical History  Procedure Laterality Date  . Cardiac catheterization  2006  . No past surgeries    . Cardioversion  01/21/2011    Procedure: CARDIOVERSION;  Surgeon: Darden Palmer., MD;  Location: [facility] OR;  Service: Cardiovascular;  Laterality: N/A;  . Fx collar bone    . Cardiac catheterization  12/2004    Left   . Doppler echocardiography  02/2011  . Electrophysiology study N/A 12/16/2011    Procedure: ELECTROPHYSIOLOGY STUDY;  Surgeon: Marinus Maw, MD;  Location: [facility] CATH LAB;  Service: Cardiovascular;  Laterality: N/A;     Family History  Problem Relation Age of Onset  . CVA Mother   . Heart disease Father   . Suicidality Sister   . Stroke Mother   . Coronary artery disease Father   . COPD Father  Social History   Social History  . Marital Status: Single    Spouse Name: N/A  . Number of Children: N/A  . 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 Smoking status: Current Every Day Smoker -- 2.00 packs/day for 38 years  . Smokeless tobacco: Not on file  . Alcohol Use: 0.0 oz/week    0 Standard drinks or equivalent per week  . Drug Use: No  . Sexual Activity: Not on file   Other Topics Concern  . Not on file   Social History Narrative   ** Merged History Encounter **         BP 136/78 mmHg  Pulse 101  Ht 5\' 11"  (1.803 m)  Wt 166 lb 3.2 oz (75.388 kg)  BMI 23.19 kg/m2  SpO2 90%  Physical Exam:  Diskempt but well appearing 65 yo man, NAD HEENT: Unremarkable Neck:  6 cm JVD, no thyromegally Back:  No CVA tenderness Lungs:   Clear with no wheezes, rales, or rhonchi, loop recorder in place. HEART:  Regular rate rhythm, no murmurs, no rubs, no clicks Abd:  soft, positive bowel sounds, no organomegally, no rebound, no guarding Ext:  2 plus pulses, no edema, no cyanosis, no clubbing Skin:  No rashes no nodules Neuro:  CN II through XII intact, motor grossly intact  EKG - nsr  DEVICE  Normal device function.  ILR demonstrates no brady or tachy arrhythmias. He is approaching ERI.  Assess/Plan: 1. Unexplained syncope - he has had no recurrent syncope since his device was placed. He is approaching ERI. 2. PAF - he is maintaining NSR. He will reduce his dose of amio to 100 mg daily. 3. 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 4. COPD - he is now on oxygen. He will continue this medication. Continue steroids.

## 2015-07-19 ENCOUNTER — Ambulatory Visit (INDEPENDENT_AMBULATORY_CARE_PROVIDER_SITE_OTHER): Payer: Medicaid Other | Admitting: *Deleted

## 2015-07-19 ENCOUNTER — Telehealth: Payer: Self-pay | Admitting: Internal Medicine

## 2015-07-19 ENCOUNTER — Encounter: Payer: Self-pay | Admitting: Internal Medicine

## 2015-07-19 DIAGNOSIS — Z4509 Encounter for adjustment and management of other cardiac device: Secondary | ICD-10-CM

## 2015-07-19 LAB — CUP PACEART INCLINIC DEVICE CHECK
Date Time Interrogation Session: 20170608112247
Pulse Gen Serial Number: 2846156

## 2015-07-19 NOTE — Telephone Encounter (Signed)
Walk in pt form-needs to speak with nurse-orders need to be sent to Ortho Surgery for Shoulder Surgery- Gave to [facility]

## 2015-07-19 NOTE — Progress Notes (Signed)
Loop check in clinic. Battery longevity 3 months. R-waves 0.49mV. 2 asystole episodes- undersensing. Patient upset about not being able to have MRI due to clearance not received by "bone doctor." He would like the ILR explanted. I have arranged follow up with Dr. Ladona Ridgel to discuss ILR explant 08/15/15 at 10:15am.

## 2015-08-12 ENCOUNTER — Emergency Department (HOSPITAL_COMMUNITY): Payer: Medicaid Other

## 2015-08-12 ENCOUNTER — Emergency Department (HOSPITAL_COMMUNITY)
Admission: EM | Admit: 2015-08-12 | Discharge: 2015-08-12 | Disposition: A | Discharge status: Home / Self Care | Payer: Medicaid Other | Attending: Emergency Medicine | Admitting: Emergency Medicine

## 2015-08-12 ENCOUNTER — Encounter (HOSPITAL_COMMUNITY): Payer: Self-pay | Admitting: Emergency Medicine

## 2015-08-12 DIAGNOSIS — I2510 Atherosclerotic heart disease of native coronary artery without angina pectoris: Secondary | ICD-10-CM | POA: Diagnosis not present

## 2015-08-12 DIAGNOSIS — I509 Heart failure, unspecified: Secondary | ICD-10-CM | POA: Diagnosis not present

## 2015-08-12 DIAGNOSIS — X58XXXA Exposure to other specified factors, initial encounter: Secondary | ICD-10-CM | POA: Insufficient documentation

## 2015-08-12 DIAGNOSIS — I4891 Unspecified atrial fibrillation: Secondary | ICD-10-CM | POA: Insufficient documentation

## 2015-08-12 DIAGNOSIS — M25512 Pain in left shoulder: Secondary | ICD-10-CM | POA: Diagnosis present

## 2015-08-12 DIAGNOSIS — Y929 Unspecified place or not applicable: Secondary | ICD-10-CM | POA: Diagnosis not present

## 2015-08-12 DIAGNOSIS — Y999 Unspecified external cause status: Secondary | ICD-10-CM | POA: Diagnosis not present

## 2015-08-12 DIAGNOSIS — Y939 Activity, unspecified: Secondary | ICD-10-CM | POA: Insufficient documentation

## 2015-08-12 DIAGNOSIS — S43102A Unspecified dislocation of left acromioclavicular joint, initial encounter: Secondary | ICD-10-CM | POA: Diagnosis not present

## 2015-08-12 DIAGNOSIS — I110 Hypertensive heart disease with heart failure: Secondary | ICD-10-CM | POA: Diagnosis not present

## 2015-08-12 DIAGNOSIS — F17210 Nicotine dependence, cigarettes, uncomplicated: Secondary | ICD-10-CM | POA: Diagnosis not present

## 2015-08-12 DIAGNOSIS — J449 Chronic obstructive pulmonary disease, unspecified: Secondary | ICD-10-CM | POA: Diagnosis not present

## 2015-08-12 MED ORDER — TRAMADOL HCL 50 MG PO TABS: 50.0000 mg | ORAL_TABLET | Freq: Four times a day (QID) | ORAL | Status: DC | PRN

## 2015-08-12 MED ORDER — HYDROMORPHONE HCL 1 MG/ML IJ SOLN
1.0000 mg | Freq: Once | INTRAMUSCULAR | Status: AC
  Administered 2015-08-12: 1 mg via INTRAMUSCULAR
  Filled 2015-08-12: qty 1

## 2015-08-12 NOTE — Discharge Instructions (Signed)
Shoulder Separation  A shoulder separation (acromioclavicular separation) is an injury to the connecting tissue (ligament) between the top of your shoulder blade (acromion) and your collarbone (clavicle).  The ligament may be stretched, partially torn, or completely torn.   A stretched ligament may not cause very much pain, and it does not move the collarbone out of place. A stretched ligament looks normal on an X-ray.   An injury that is a bit worse may partially tear a ligament and move the collarbone slightly out of place.   A serious injury completely tears both shoulder ligaments. This moves the collarbone severely out of position and changes the way that the shoulder looks (deformity).  CAUSES  The most common cause of a shoulder separation is falling on or receiving a blow to the top of the shoulder. Falling with an outstretched arm may also cause this injury.  RISK FACTORS  You may be at greater risk of a shoulder separation if:   You are male.   You are younger than age 35.   You play a contact sport, such as football or hockey.  SIGNS AND SYMPTOMS  The most common symptom of a shoulder separation is pain on the top of the shoulder after falling on it or receiving a blow to it. Other signs and symptoms include:   Shoulder deformity.   Swelling of the shoulder.   Decreased ability to move the shoulder.   Bruising on top of the shoulder.  DIAGNOSIS  Your health care provider may suspect a shoulder separation based on your symptoms and the details of a recent injury. A physical exam will be done. During this exam, the health care provider may:   Press on your shoulder.   Test the movement of your shoulder.   Ask you to hold a weight in your hand to see if the separation increases.   Do an X-ray.  TREATMENT   A stretch injury may require only a sling, pain medicine, and cold packs. This treatment may last for 2-12 weeks. You may also have physical therapy. A physical therapist will teach you to  do daily exercises to strengthen your shoulder muscles and prevent stiffness.   A complete tear may require surgery to repair the torn ligament. After surgery, you will also require a sling, pain medicine, and cold packs. Recovery may take longer. You may also need more physical therapy.  HOME CARE INSTRUCTIONS   Take medicines only as directed by your health care provider.   Apply ice to the top of your shoulder:   Put ice in a plastic bag.   Place a towel between your skin and the bag.   Leave the ice on for 20 minutes, 2-3 times a day.   Wear your sling or splint as directed by your health care provider.   You may be able to remove your sling to do your physical therapy exercises.   Ask your health care provider when you can stop wearing the sling.   Do not do any activities that make your pain worse.   Do not lift anything that is heavier than 10 lb (4.5 kg) on the injured side of your body.   Ask your health care provider when you can return to athletic activities.  SEEK MEDICAL CARE IF:   Your pain medicine is not relieving your pain.   Your pain and stiffness are not improving after 2 weeks.   You are unable to do your physical therapy exercises because   of pain or stiffness.     This information is not intended to replace advice given to you by your health care provider. Make sure you discuss any questions you have with your health care provider.     Document Released: 11/06/2004 Document Revised: 02/17/2014 Document Reviewed: 06/29/2013  Elsevier Interactive Patient Education 2016 [employer]

## 2015-08-12 NOTE — ED Notes (Signed)
Bed: VO59 Expected date:  Expected time:  Means of arrival:  Comments: 76 M shoulder pain/o2 dependent

## 2015-08-12 NOTE — ED Notes (Signed)
Patient left shoulder is in pain. Patient has had surgery on it about a year ago and today he comes in due to he cant take the pain. Patient is on oxygen of 3L.
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
## 2015-08-15 ENCOUNTER — Ambulatory Visit (INDEPENDENT_AMBULATORY_CARE_PROVIDER_SITE_OTHER): Payer: Medicaid Other | Admitting: Internal Medicine

## 2015-08-15 ENCOUNTER — Encounter: Payer: Self-pay | Admitting: *Deleted

## 2015-08-15 ENCOUNTER — Encounter: Payer: Self-pay | Admitting: Internal Medicine

## 2015-08-15 VITALS — BP 128/72 | HR 80 | Ht 71.0 in | Wt 153.8 lb

## 2015-08-15 DIAGNOSIS — R55 Syncope and collapse: Secondary | ICD-10-CM | POA: Diagnosis not present

## 2015-08-15 MED ORDER — DABIGATRAN ETEXILATE MESYLATE 150 MG PO CAPS: 300.0000 mg | ORAL_CAPSULE | Freq: Two times a day (BID) | ORAL | Status: DC

## 2015-08-15 NOTE — Patient Instructions (Signed)
Medication Instructions:  Your physician recommends that you continue on your current medications as directed. Please refer to the Current Medication list given to you today.  Labwork: None ordered  Testing/Procedures: Your physician has recommended that you have your LINQ monitor explanted. Please see the instruction sheet given to you today for more information.  Follow-Up: Your physician recommends that you schedule a follow-up appointment in: 10-14 days, after procedure explant on 08/24/2015, with device clinic for a wound check.  If you need a refill on your cardiac medications before your next appointment, please call your pharmacy.  Thank you for choosing [facility]!!     Any Other Special Instructions Will Be Listed Below (If Applicable). [facility]:  Dr. Francena Hanly  Dr. Malon Kindle

## 2015-08-15 NOTE — Progress Notes (Signed)
HPI [name] returns today for followup. He is a pleasant 65 yo man with a h/o unexplained syncope, s/p ILR insertion. He has a h/o atrial fibrillation. In the interim, he has been stable.  No recurrent syncope. No other symptoms. He has not had any syncope. He is now on oxygen and is still smoking. He would like to have his [employer] ILR removed. No Known Allergies   Current Outpatient Prescriptions  Medication Sig Dispense Refill  . amiodarone (PACERONE) 200 MG tablet Take 0.5 tablets (100 mg total) by mouth daily.    . dabigatran (PRADAXA) 150 MG CAPS capsule Take 300 mg by mouth 2 (two) times daily.     . furosemide (LASIX) 40 MG tablet Take 40 mg by mouth daily.      . ibuprofen (ADVIL,MOTRIN) 200 MG tablet Take 200 mg by mouth every 6 (six) hours as needed for moderate pain.    . losartan (COZAAR) 25 MG tablet Take 25 mg by mouth daily.    . metoprolol (TOPROL-XL) 50 MG 24 hr tablet Take 50 mg by mouth daily.     . mometasone-formoterol (DULERA) 100-5 MCG/ACT AERO Inhale 2 puffs into the lungs 2 (two) times daily. 1 Inhaler 3  . OLANZapine (ZYPREXA) 5 MG tablet Take 5 mg by mouth at bedtime.  0  . PROAIR HFA 108 (90 Base) MCG/ACT inhaler Inhale 1 puff into the lungs every 6 (six) hours as needed for wheezing.   0  . sertraline (ZOLOFT) 100 MG tablet Take 100 mg by mouth every morning.  0  . SPIRIVA HANDIHALER 18 MCG inhalation capsule Place 18 mcg into inhaler and inhale daily.  0  . spironolactone (ALDACTONE) 25 MG tablet Take 25 mg by mouth daily.    . traMADol (ULTRAM) 50 MG tablet Take 50 mg by mouth every 6 (six) hours as needed (pain).     No current facility-administered medications for this visit.     Past Medical History  Diagnosis Date  . Cardiomyopathy, idiopathic (HCC)   . Non compliance with medical treatment   . Arrhythmia   . A-fib (HCC)   . Coronary artery disease   . Idiopathic cardiomyopathy (HCC) 07/16/2009  . CAD (coronary artery disease) 12/15/2004  .  Seizures (HCC)   . AF (atrial fibrillation) (HCC)   . COPD (chronic obstructive pulmonary disease) (HCC)   . Angina pectoris   . HTN (hypertension)   . Alcohol abuse   . CHF (congestive heart failure) (HCC)   . Heavy cigarette smoker     ROS:   All systems reviewed and negative except as noted in the HPI.   Past Surgical History  Procedure Laterality Date  . Cardiac catheterization  2006  . No past surgeries    . Cardioversion  01/21/2011    Procedure: CARDIOVERSION;  Surgeon: W Spencer Tilley Jr., MD;  Location: [facility] OR;  Service: Cardiovascular;  Laterality: N/A;  . Fx collar bone    . Cardiac catheterization  12/2004    Left   . Doppler echocardiography  02/2011  . Electrophysiology study N/A 12/16/2011    Procedure: ELECTROPHYSIOLOGY STUDY;  Surgeon: Dallon Dacosta W Dontrez Pettis, MD;  Location: [facility] CATH LAB;  Service: Cardiovascular;  Laterality: N/A;     Family History  Problem Relation Age of Onset  . CVA Mother   . Heart disease Father   . Suicidality Sister   . Stroke Mother   . Coronary artery disease Father   . COPD Father        Social History   Social History  . Marital Status: Single    Spouse Name: N/A  . Number of Children: N/A  . 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 Smoking status: Current Every Day Smoker -- 2.00 packs/day for 38 years  . Smokeless tobacco: Not on file  . Alcohol Use: 0.0 oz/week    0 Standard drinks or equivalent per week  . Drug Use: No  . Sexual Activity: Not on file   Other Topics Concern  . Not on file   Social History Narrative   ** Merged History Encounter **         BP 128/72 mmHg  Pulse 80  Ht 5\' 11"  (1.803 m)  Wt 153 lb 12.8 oz (69.763 kg)  BMI 21.46 kg/m2  SpO2 93%  Physical Exam:  Diskempt but well appearing 65 yo man, NAD HEENT: Unremarkable Neck:  6 cm JVD, no thyromegally Back:  No CVA tenderness Lungs:  Clear with no wheezes, rales, or  rhonchi, loop recorder in place. HEART:  Regular rate rhythm, no murmurs, no rubs, no clicks Abd:  soft, positive bowel sounds, no organomegally, no rebound, no guarding Ext:  2 plus pulses, no edema, no cyanosis, no clubbing Skin:  No rashes no nodules Neuro:  CN II through XII intact, motor grossly intact   Assess/Plan: 1. Unexplained syncope - he has had no recurrent syncope since his device was placed. We will remove his ILR. 2. PAF - he is maintaining NSR. He will reduce his dose of amio to 100 mg daily. 3. 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 4. COPD - he is now on oxygen. He will continue this medication. Continue steroids.  Leonia Reeves.D.

## 2015-08-24 ENCOUNTER — Ambulatory Visit (HOSPITAL_COMMUNITY)
Admission: RE | Admit: 2015-08-24 | Discharge: 2015-08-24 | Disposition: A | Discharge status: Home / Self Care | Payer: Medicaid Other | Source: Ambulatory Visit | Attending: Internal Medicine | Admitting: Internal Medicine

## 2015-08-24 ENCOUNTER — Encounter (HOSPITAL_COMMUNITY)
Admission: RE | Disposition: A | Discharge status: Home / Self Care | Payer: Self-pay | Source: Ambulatory Visit | Attending: Internal Medicine

## 2015-08-24 DIAGNOSIS — I429 Cardiomyopathy, unspecified: Secondary | ICD-10-CM | POA: Diagnosis not present

## 2015-08-24 DIAGNOSIS — R569 Unspecified convulsions: Secondary | ICD-10-CM | POA: Insufficient documentation

## 2015-08-24 DIAGNOSIS — F1010 Alcohol abuse, uncomplicated: Secondary | ICD-10-CM | POA: Diagnosis not present

## 2015-08-24 DIAGNOSIS — I2510 Atherosclerotic heart disease of native coronary artery without angina pectoris: Secondary | ICD-10-CM | POA: Insufficient documentation

## 2015-08-24 DIAGNOSIS — Z4509 Encounter for adjustment and management of other cardiac device: Secondary | ICD-10-CM | POA: Insufficient documentation

## 2015-08-24 DIAGNOSIS — J449 Chronic obstructive pulmonary disease, unspecified: Secondary | ICD-10-CM | POA: Diagnosis not present

## 2015-08-24 DIAGNOSIS — F17210 Nicotine dependence, cigarettes, uncomplicated: Secondary | ICD-10-CM | POA: Insufficient documentation

## 2015-08-24 DIAGNOSIS — R55 Syncope and collapse: Secondary | ICD-10-CM | POA: Diagnosis not present

## 2015-08-24 DIAGNOSIS — Z823 Family history of stroke: Secondary | ICD-10-CM | POA: Diagnosis not present

## 2015-08-24 DIAGNOSIS — Z8249 Family history of ischemic heart disease and other diseases of the circulatory system: Secondary | ICD-10-CM | POA: Diagnosis not present

## 2015-08-24 DIAGNOSIS — I480 Paroxysmal atrial fibrillation: Secondary | ICD-10-CM | POA: Diagnosis not present

## 2015-08-24 DIAGNOSIS — I509 Heart failure, unspecified: Secondary | ICD-10-CM | POA: Diagnosis not present

## 2015-08-24 DIAGNOSIS — Z7902 Long term (current) use of antithrombotics/antiplatelets: Secondary | ICD-10-CM | POA: Diagnosis not present

## 2015-08-24 DIAGNOSIS — I110 Hypertensive heart disease with heart failure: Secondary | ICD-10-CM | POA: Insufficient documentation

## 2015-08-24 HISTORY — PX: EP IMPLANTABLE DEVICE: SHX172B

## 2015-08-24 SURGERY — LOOP RECORDER REMOVAL
Anesthesia: LOCAL

## 2015-08-24 MED ORDER — SODIUM CHLORIDE 0.9 % IR SOLN: 80.0000 mg | Status: DC

## 2015-08-24 MED ORDER — LIDOCAINE-EPINEPHRINE 1 %-1:100000 IJ SOLN
INTRAMUSCULAR | Status: DC | PRN
Start: 2015-08-24 — End: 2015-08-24
  Administered 2015-08-24: 20 mL

## 2015-08-24 MED ORDER — CHLORHEXIDINE GLUCONATE 4 % EX LIQD: 60.0000 mL | Freq: Once | CUTANEOUS | Status: DC

## 2015-08-24 MED ORDER — ONDANSETRON HCL 4 MG/2ML IJ SOLN: 4.0000 mg | Freq: Four times a day (QID) | INTRAMUSCULAR | Status: DC | PRN

## 2015-08-24 MED ORDER — LIDOCAINE-EPINEPHRINE 1 %-1:100000 IJ SOLN
INTRAMUSCULAR | Status: AC
  Filled 2015-08-24: qty 1

## 2015-08-24 MED ORDER — CEFAZOLIN SODIUM-DEXTROSE 2-4 GM/100ML-% IV SOLN: 2.0000 g | INTRAVENOUS | Status: DC

## 2015-08-24 MED ORDER — SODIUM CHLORIDE 0.9 % IV SOLN: INTRAVENOUS | Status: DC

## 2015-08-24 MED ORDER — ACETAMINOPHEN 325 MG PO TABS: 325.0000 mg | ORAL_TABLET | ORAL | Status: DC | PRN

## 2015-08-24 SURGICAL SUPPLY — 1 items: PACK LOOP INSERTION (CUSTOM PROCEDURE TRAY) ×2 IMPLANT

## 2015-08-24 NOTE — H&P (View-Only) (Signed)
HPI Mr. Gude returns today for followup. He is a pleasant 65 yo man with a h/o unexplained syncope, s/p ILR insertion. He has a h/o atrial fibrillation. In the interim, he has been stable.  No recurrent syncope. No other symptoms. He has not had any syncope. He is now on oxygen and is still smoking. He would like to have his [employer] ILR removed. No Known Allergies   Current Outpatient Prescriptions  Medication Sig Dispense Refill  . amiodarone (PACERONE) 200 MG tablet Take 0.5 tablets (100 mg total) by mouth daily.    . dabigatran (PRADAXA) 150 MG CAPS capsule Take 300 mg by mouth 2 (two) times daily.     . furosemide (LASIX) 40 MG tablet Take 40 mg by mouth daily.      Marland Kitchen ibuprofen (ADVIL,MOTRIN) 200 MG tablet Take 200 mg by mouth every 6 (six) hours as needed for moderate pain.    Marland Kitchen losartan (COZAAR) 25 MG tablet Take 25 mg by mouth daily.    . metoprolol (TOPROL-XL) 50 MG 24 hr tablet Take 50 mg by mouth daily.     . mometasone-formoterol (DULERA) 100-5 MCG/ACT AERO Inhale 2 puffs into the lungs 2 (two) times daily. 1 Inhaler 3  . OLANZapine (ZYPREXA) 5 MG tablet Take 5 mg by mouth at bedtime.  0  . PROAIR HFA 108 (90 Base) MCG/ACT inhaler Inhale 1 puff into the lungs every 6 (six) hours as needed for wheezing.   0  . sertraline (ZOLOFT) 100 MG tablet Take 100 mg by mouth every morning.  0  . SPIRIVA HANDIHALER 18 MCG inhalation capsule Place 18 mcg into inhaler and inhale daily.  0  . spironolactone (ALDACTONE) 25 MG tablet Take 25 mg by mouth daily.    . traMADol (ULTRAM) 50 MG tablet Take 50 mg by mouth every 6 (six) hours as needed (pain).     No current facility-administered medications for this visit.     Past Medical History  Diagnosis Date  . Cardiomyopathy, idiopathic (HCC)   . Non compliance with medical treatment   . Arrhythmia   . A-fib (HCC)   . Coronary artery disease   . Idiopathic cardiomyopathy (HCC) 07/16/2009  . CAD (coronary artery disease) 12/15/2004  .  Seizures (HCC)   . AF (atrial fibrillation) (HCC)   . COPD (chronic obstructive pulmonary disease) (HCC)   . Angina pectoris   . HTN (hypertension)   . Alcohol abuse   . CHF (congestive heart failure) (HCC)   . Heavy cigarette smoker     ROS:   All systems reviewed and negative except as noted in the HPI.   Past Surgical History  Procedure Laterality Date  . Cardiac catheterization  2006  . No past surgeries    . Cardioversion  01/21/2011    Procedure: CARDIOVERSION;  Surgeon: Darden Palmer., MD;  Location: [facility] OR;  Service: Cardiovascular;  Laterality: N/A;  . Fx collar bone    . Cardiac catheterization  12/2004    Left   . Doppler echocardiography  02/2011  . Electrophysiology study N/A 12/16/2011    Procedure: ELECTROPHYSIOLOGY STUDY;  Surgeon: Marinus Maw, MD;  Location: [facility] CATH LAB;  Service: Cardiovascular;  Laterality: N/A;     Family History  Problem Relation Age of Onset  . CVA Mother   . Heart disease Father   . Suicidality Sister   . Stroke Mother   . Coronary artery disease Father   . COPD Father  Social History   Social History  . Marital Status: Single    Spouse Name: N/A  . Number of Children: N/A  . 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 Smoking status: Current Every Day Smoker -- 2.00 packs/day for 38 years  . Smokeless tobacco: Not on file  . Alcohol Use: 0.0 oz/week    0 Standard drinks or equivalent per week  . Drug Use: No  . Sexual Activity: Not on file   Other Topics Concern  . Not on file   Social History Narrative   ** Merged History Encounter **         BP 128/72 mmHg  Pulse 80  Ht 5\' 11"  (1.803 m)  Wt 153 lb 12.8 oz (69.763 kg)  BMI 21.46 kg/m2  SpO2 93%  Physical Exam:  Diskempt but well appearing 65 yo man, NAD HEENT: Unremarkable Neck:  6 cm JVD, no thyromegally Back:  No CVA tenderness Lungs:  Clear with no wheezes, rales, or  rhonchi, loop recorder in place. HEART:  Regular rate rhythm, no murmurs, no rubs, no clicks Abd:  soft, positive bowel sounds, no organomegally, no rebound, no guarding Ext:  2 plus pulses, no edema, no cyanosis, no clubbing Skin:  No rashes no nodules Neuro:  CN II through XII intact, motor grossly intact   Assess/Plan: 1. Unexplained syncope - he has had no recurrent syncope since his device was placed. We will remove his ILR. 2. PAF - he is maintaining NSR. He will reduce his dose of amio to 100 mg daily. 3. 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 4. COPD - he is now on oxygen. He will continue this medication. Continue steroids.  Leonia Reeves.D.

## 2015-08-24 NOTE — Interval H&P Note (Signed)
History and Physical Interval Note:  08/24/2015 7:40 AM  Johnny Rodriguez  has presented today for surgery, with the diagnosis of syncope  The various methods of treatment have been discussed with the patient and family. After consideration of risks, benefits and other options for treatment, the patient has consented to  Procedure(s): Loop Recorder Removal (N/A) as a surgical intervention .  The patient's history has been reviewed, patient examined, no change in status, stable for surgery.  I have reviewed the patient's chart and labs.  Questions were answered to the patient's satisfaction.     Lewayne Bunting

## 2015-08-24 NOTE — Progress Notes (Signed)
Pt here early for his procedure.  States he had to ride his bike here and did not want to be late for his procedure.  Pt is on O2 and is winded.  Placed in room and put back on his O2. Given something to eat and drink.  Dr. Ladona Ridgel notified that pt is here and that he rode his bike from [place].  Will attempt to get pt a bus voucher home.

## 2015-08-24 NOTE — Progress Notes (Signed)
CSW was contacted by off duty ED RN Case Manager with a voicemail that Case Manager had received re: transportation needs. CSW followed up with number provided and spoke with Pam who reports that she was able to get a taxi voucher from a Child psychotherapist. Pam also expressed her dissatisfaction with the process of getting in contact with CSW. This CSW explained that she had not received a telephone call or consult until RN Case Manager contacted her. CSW provided Pam with direct contact information for both this CSW and 2nd shift ED social worker and advised her to contact directly with any consults or social work needs.     Lance Muss, LCSW [facility] ED/35M Clinical Social Worker [phone]

## 2015-08-24 NOTE — Progress Notes (Signed)
Notified case management, social work, and Airline pilot of need for transportation home. Voucher obtained.

## 2015-08-27 ENCOUNTER — Encounter (HOSPITAL_COMMUNITY): Payer: Self-pay | Admitting: Internal Medicine

## 2015-08-30 ENCOUNTER — Telehealth: Payer: Self-pay | Admitting: Internal Medicine

## 2015-08-30 NOTE — Telephone Encounter (Signed)
New Message  Pt stated that his tape/steri strips have fallen off- from loop recorder procedure. Pt wanted to know what to do. Please call back and discuss.

## 2015-08-30 NOTE — Telephone Encounter (Signed)
Called pt back, pt stated that he could see sutures. Pt stated that he could only come into the office on Monday. Informed pt that he could come in on Monday at 9am to the device clinic.

## 2015-09-03 ENCOUNTER — Ambulatory Visit (INDEPENDENT_AMBULATORY_CARE_PROVIDER_SITE_OTHER): Payer: Medicaid Other | Admitting: *Deleted

## 2015-09-03 ENCOUNTER — Encounter (INDEPENDENT_AMBULATORY_CARE_PROVIDER_SITE_OTHER): Payer: Self-pay

## 2015-09-03 DIAGNOSIS — Z9889 Other specified postprocedural states: Secondary | ICD-10-CM

## 2015-09-03 NOTE — Progress Notes (Signed)
Wound check in clinic, s/p ILR extraction on 08/24/15.  Steri-strips previously removed by patient.  Incision edges approximated, wound well healed.  Patient reports he is out of all of his medications and cannot afford to refill them until next week.  Samples of Pradaxa given today.  Patient instructed to follow-up with Dr. Donnie Aho (primary cardiologist per patient) as scheduled.  ROV with [facility] in 12 months.

## 2015-09-11 ENCOUNTER — Telehealth: Payer: Self-pay | Admitting: Internal Medicine

## 2015-09-11 NOTE — Telephone Encounter (Signed)
New message       Pt c/o medication issue:  1. Name of Medication: pradaxa  2. How are you currently taking this medication (dosage and times per day)? 150 mg po  3. Are you having a reaction (difficulty breathing--STAT)? no  4. What is your medication issue? Need clarification on direction

## 2015-09-11 NOTE — Telephone Encounter (Signed)
Patient should be on 150 mg twice daily.  Patient aware and verbalized understanding

## 2015-09-12 ENCOUNTER — Telehealth: Payer: Self-pay | Admitting: Internal Medicine

## 2015-09-12 NOTE — Telephone Encounter (Signed)
Returned call to pharmacy and made them aware to take Pradaxa 150 mg bid

## 2015-09-12 NOTE — Telephone Encounter (Signed)
Follow Up:     Please call asap,they need clarification on how pt is supposed to be taking his Medicine.

## 2015-09-12 NOTE — Telephone Encounter (Signed)
Follow Up:     The medicine is Pradaxa that needs clarification.

## 2015-11-13 ENCOUNTER — Emergency Department (HOSPITAL_COMMUNITY): Payer: Medicaid Other

## 2015-11-13 ENCOUNTER — Encounter (HOSPITAL_COMMUNITY): Payer: Self-pay

## 2015-11-13 ENCOUNTER — Emergency Department (HOSPITAL_COMMUNITY)
Admission: EM | Admit: 2015-11-13 | Discharge: 2015-11-13 | Disposition: A | Discharge status: Home / Self Care | Payer: Medicaid Other | Attending: Emergency Medicine | Admitting: Emergency Medicine

## 2015-11-13 DIAGNOSIS — Z45018 Encounter for adjustment and management of other part of cardiac pacemaker: Secondary | ICD-10-CM | POA: Insufficient documentation

## 2015-11-13 DIAGNOSIS — R0789 Other chest pain: Secondary | ICD-10-CM | POA: Insufficient documentation

## 2015-11-13 DIAGNOSIS — R079 Chest pain, unspecified: Secondary | ICD-10-CM

## 2015-11-13 DIAGNOSIS — I5022 Chronic systolic (congestive) heart failure: Secondary | ICD-10-CM | POA: Insufficient documentation

## 2015-11-13 DIAGNOSIS — I110 Hypertensive heart disease with heart failure: Secondary | ICD-10-CM | POA: Insufficient documentation

## 2015-11-13 DIAGNOSIS — I2510 Atherosclerotic heart disease of native coronary artery without angina pectoris: Secondary | ICD-10-CM | POA: Diagnosis not present

## 2015-11-13 DIAGNOSIS — J449 Chronic obstructive pulmonary disease, unspecified: Secondary | ICD-10-CM | POA: Diagnosis not present

## 2015-11-13 DIAGNOSIS — F17200 Nicotine dependence, unspecified, uncomplicated: Secondary | ICD-10-CM | POA: Diagnosis not present

## 2015-11-13 DIAGNOSIS — Z79899 Other long term (current) drug therapy: Secondary | ICD-10-CM | POA: Diagnosis not present

## 2015-11-13 DIAGNOSIS — R072 Precordial pain: Secondary | ICD-10-CM | POA: Diagnosis present

## 2015-11-13 LAB — CBC
HCT: 43.5 % (ref 39.0–52.0)
Hemoglobin: 14.5 g/dL (ref 13.0–17.0)
MCH: 31.2 pg (ref 26.0–34.0)
MCHC: 33.3 g/dL (ref 30.0–36.0)
MCV: 93.5 fL (ref 78.0–100.0)
Platelets: 250 10*3/uL (ref 150–400)
RBC: 4.65 MIL/uL (ref 4.22–5.81)
RDW: 15 % (ref 11.5–15.5)
WBC: 9.9 10*3/uL (ref 4.0–10.5)

## 2015-11-13 LAB — BASIC METABOLIC PANEL
Anion gap: 7 (ref 5–15)
BUN: 11 mg/dL (ref 6–20)
CO2: 28 mmol/L (ref 22–32)
Calcium: 9.1 mg/dL (ref 8.9–10.3)
Chloride: 105 mmol/L (ref 101–111)
Creatinine, Ser: 1.11 mg/dL (ref 0.61–1.24)
GFR calc Af Amer: >60 mL/min (ref >60)
GFR calc non Af Amer: >60 mL/min (ref >60)
Glucose, Bld: 133 mg/dL — ABNORMAL HIGH (ref 65–99)
Potassium: 3.7 mmol/L (ref 3.5–5.1)
Sodium: 140 mmol/L (ref 135–145)

## 2015-11-13 LAB — I-STAT TROPONIN, ED
Troponin i, poc: 0 ng/mL (ref 0.00–0.08)
Troponin i, poc: 0.02 ng/mL (ref 0.00–0.08)

## 2015-11-13 LAB — PROTIME-INR
INR: 1.58
Prothrombin Time: 19.1 seconds — ABNORMAL HIGH (ref 11.4–15.2)

## 2015-11-13 MED ORDER — IPRATROPIUM-ALBUTEROL 0.5-2.5 (3) MG/3ML IN SOLN
3.0000 mL | Freq: Once | RESPIRATORY_TRACT | Status: AC
  Administered 2015-11-13: 3 mL via RESPIRATORY_TRACT
  Filled 2015-11-13: qty 3

## 2015-11-13 NOTE — ED Notes (Signed)
  Gave pt ice water per Dr. Rosalia Hammers.Marland KitchenMarland KitchenMarland KitchenNehemiah Settle - RN aware

## 2015-11-13 NOTE — ED Notes (Signed)
Gave pt bagged lunch ([place] sandwich & applesauce), per Dr. Rosalia Hammers. Brooke - RN aware.

## 2015-11-13 NOTE — ED Triage Notes (Signed)
Per EMS - pt c/o central chest tightness, shortness of breath, dizziness. Pt rode bicycle with oxygen tank to [facility] for appt for depression. Pt left oxygen on bicycle at 1200. Normally wears 3L [place]. No oxygen upon [facility] arrival, 85% on [place], 97% on 3L [place]. Hx MI x 2, COPD. CP nonradiating, given 324mg  aspirin and 1 nitro. Diminished and wheezing in all lung fields, given 5mg  albuterol neb. Reports relief since neb tx.

## 2015-11-13 NOTE — ED Provider Notes (Signed)
[facility]-EMERGENCY DEPT Provider Note   CSN: [id number] Arrival date & time: 11/13/15  1428     History   Chief Complaint Chief Complaint  Patient presents with  . Chest Pain    HPI Johnny Rodriguez is a 65 y.o. male.  The history is provided by the patient.  Chest Pain   This is a new problem. The current episode started less than 1 hour ago. The problem occurs constantly. The problem has been resolved. The pain is associated with exertion (riding bike without his home prescribed 3L oxygen). The pain is present in the substernal region. The pain is moderate. The quality of the pain is described as brief, exertional and pressure-like. The pain does not radiate. Duration of episode(s) is 30 minutes. The symptoms are aggravated by exertion. Associated symptoms include shortness of breath. Pertinent negatives include no abdominal pain, no back pain, no claudication, no cough, no diaphoresis, no headaches, no leg pain, no lower extremity edema, no malaise/fatigue, no nausea, no vomiting and no weakness. He has tried rest for the symptoms. The treatment provided significant relief.  His past medical history is significant for CAD and COPD.    Past Medical History:  Diagnosis Date  . A-fib (HCC)   . AF (atrial fibrillation) (HCC)   . Alcohol abuse   . Angina pectoris   . Arrhythmia   . CAD (coronary artery disease) 12/15/2004  . Cardiomyopathy, idiopathic (HCC)   . CHF (congestive heart failure) (HCC)   . COPD (chronic obstructive pulmonary disease) (HCC)   . Coronary artery disease   . Heavy cigarette smoker   . HTN (hypertension)   . Idiopathic cardiomyopathy (HCC) 07/16/2009  . Non compliance with medical treatment   . Seizures [facility](HCC)     Patient Active Problem List   Diagnosis Date Noted  . Constipation   . COPD exacerbation (HCC) 02/08/2015  . Essential hypertension 02/08/2015  . Bed bug bite 02/08/2015  . Preop cardiovascular exam 09/28/2013  . Syncope 12/10/2011  . History of  noncompliance with medical treatment 12/01/2011  . Chronic systolic heart failure (HCC) 04/10/2011  . Idiopathic cardiomyopathy (HCC) 07/16/2009  . Atrial fibrillation (HCC) 07/16/2009  . COPD 07/16/2009  . CAD (coronary artery disease) 12/15/2004    Past Surgical History:  Procedure Laterality Date  . CARDIAC CATHETERIZATION  2006  . CARDIAC CATHETERIZATION  12/2004   Left   . CARDIOVERSION  01/21/2011   Procedure: CARDIOVERSION;  Surgeon: Darden PalmerW Spencer Tilley Jr., MD;  [facility];  Service: Cardiovascular;  Laterality: N/A;  . DOPPLER ECHOCARDIOGRAPHY  02/2011  . ELECTROPHYSIOLOGY STUDY N/A 12/16/2011   Procedure: ELECTROPHYSIOLOGY STUDY;  Surgeon: Marinus MawGregg W Taylor, MD;  [facility];  Service: Cardiovascular;  Laterality: N/A;  . EP IMPLANTABLE DEVICE N/A 08/24/2015   Procedure: Loop Recorder Removal;  Surgeon: Marinus MawGregg W Taylor, MD;  Location: [facility] INVASIVE CV LAB;  Service: Cardiovascular;  Laterality: N/A;  . fx collar bone    . NO PAST SURGERIES         Home Medications    Prior to Admission medications   Medication Sig Start Date End Date Taking? Authorizing Provider  amiodarone (PACERONE) 200 MG tablet Take 0.5 tablets (100 mg total) by mouth daily. 03/14/15   Marinus MawGregg W Taylor, MD  dabigatran (PRADAXA) 150 MG CAPS capsule Take 150 mg by mouth 2 (two) times daily.    Historical Provider, MD  furosemide (LASIX) 40 MG tablet Take 40 mg by mouth daily.  Historical Provider, MD  ibuprofen (ADVIL,MOTRIN) 200 MG tablet Take 200 mg by mouth every 6 (six) hours as needed for moderate pain.    Historical Provider, MD  losartan (COZAAR) 25 MG tablet Take 25 mg by mouth daily.    Historical Provider, MD  metoprolol (TOPROL-XL) 50 MG 24 hr tablet Take 50 mg by mouth daily.     Historical Provider, MD  mometasone-formoterol (DULERA) 100-5 MCG/ACT AERO Inhale 2 puffs into the lungs 2 (two) times daily. 02/11/15   Ruben Im, MD  OLANZapine (ZYPREXA) 5 MG tablet Take 5 mg by mouth at  bedtime. 08/10/15   Historical Provider, MD  PROAIR HFA 108 (90 Base) MCG/ACT inhaler Inhale 1 puff into the lungs every 6 (six) hours as needed for wheezing.  01/09/15   Historical Provider, MD  sertraline (ZOLOFT) 100 MG tablet Take 100 mg by mouth every morning. 08/10/15   Historical Provider, MD  SPIRIVA HANDIHALER 18 MCG inhalation capsule Place 18 mcg into inhaler and inhale daily. 08/10/15   Historical Provider, MD  spironolactone (ALDACTONE) 25 MG tablet Take 25 mg by mouth daily.    Historical Provider, MD  traMADol (ULTRAM) 50 MG tablet Take 50 mg by mouth every 6 (six) hours as needed (pain).    Historical Provider, MD    Family History Family History  Problem Relation Age of Onset  . Suicidality Sister   . CVA Mother   . Stroke Mother   . Heart disease Father   . Coronary artery disease Father   . COPD Father     Social History Social History  Substance Use Topics  . Smoking status: Current Every Day Smoker    Packs/day: 2.00    Years: 38.00  . Smokeless tobacco: Never Used  . Alcohol use 0.0 oz/week     Allergies   Review of patient's allergies indicates no known allergies.   Review of Systems Review of Systems  Constitutional: Negative for diaphoresis and malaise/fatigue.  HENT: Negative for congestion.   Respiratory: Positive for shortness of breath. Negative for cough.   Cardiovascular: Positive for chest pain. Negative for claudication.  Gastrointestinal: Negative for abdominal pain, nausea and vomiting.  Genitourinary: Negative for flank pain.  Musculoskeletal: Negative for back pain.  Skin: Negative for rash.  Neurological: Negative for weakness and headaches.  Psychiatric/Behavioral: Negative for confusion.     Physical Exam Updated Vital Signs BP 125/88 (BP Location: Right Arm)   Pulse (!) 54   Temp 97.6 F (36.4 C) (Oral)   Resp 25   SpO2 100%   Physical Exam  Constitutional: He is oriented to person, place, and time. He appears  well-developed and well-nourished. No distress.  Pleasant, cooperative, non-toxic appearing  HENT:  Head: Normocephalic and atraumatic.  Eyes: Conjunctivae are normal. No scleral icterus.  Neck: Normal range of motion. Neck supple. No JVD present. No tracheal deviation present.  Cardiovascular: Normal rate and regular rhythm.  Exam reveals no gallop and no friction rub.   Murmur heard. Pulmonary/Chest: Effort normal. No respiratory distress. He has wheezes. He has no rales. He exhibits no tenderness.  End-inspiratory wheezing, normal effort  Abdominal: Soft. He exhibits no distension. There is no tenderness.  Musculoskeletal: He exhibits no edema or tenderness.  Symmetric size and appearance of b/l Le's, no calf swelling or tenderness  Neurological: He is alert and oriented to person, place, and time. He exhibits normal muscle tone. Coordination normal.  Skin: Skin is warm and dry. Capillary refill takes less than  2 seconds. He is not diaphoretic.  Psychiatric: He has a normal mood and affect.  Nursing note and vitals reviewed.    ED Treatments / Results  Labs (all labs ordered are listed, but only abnormal results are displayed) Labs Reviewed  BASIC METABOLIC PANEL - Abnormal; Notable for the following:       Result Value   Glucose, Bld 133 (*)    All other components within normal limits  PROTIME-INR - Abnormal; Notable for the following:    Prothrombin Time 19.1 (*)    All other components within normal limits  CBC  I-STAT TROPOININ, ED  I-STAT TROPOININ, ED    EKG  EKG Interpretation  Date/Time:  Tuesday November 13 2015 17:11:02 EDT Ventricular Rate:  54 PR Interval:    QRS Duration: 107 QT Interval:  437 QTC Calculation: 415 R Axis:   -76 Text Interpretation:  Sinus rhythm Left anterior fascicular block Borderline low voltage, extremity leads Confirmed by RAY MD, Duwayne Heck ([id number]) on 11/13/2015 5:26:43 PM       Radiology Dg Chest Portable 1 View  Result Date:  11/13/2015 CLINICAL DATA:  Chest pain, shortness of breath. EXAM: PORTABLE CHEST 1 VIEW COMPARISON:  Radiograph of February 09, 2015. FINDINGS: The heart size and mediastinal contours are within normal limits. Both lungs are clear. No pneumothorax or pleural effusion is noted. Atherosclerosis of thoracic aorta is noted. The visualized skeletal structures are unremarkable. IMPRESSION: Aortic atherosclerosis.  No acute cardiopulmonary abnormality seen. Electronically Signed   By: Lupita Raider, M.D.   On: 11/13/2015 15:08    Procedures Procedures (including critical care time)  Medications Ordered in ED Medications  ipratropium-albuterol (DUONEB) 0.5-2.5 (3) MG/3ML nebulizer solution 3 mL (3 mLs Nebulization Given 11/13/15 1531)     Initial Impression / Assessment and Plan / ED Course  I have reviewed the triage vital signs and the nursing notes.  Pertinent labs & imaging results that were available during my care of the patient were reviewed by me and considered in my medical decision making (see chart for details).  Clinical Course   Johnny Rodriguez is a 65 y.o. male with h/o CAD, prior Mi's, class 2 cardiomyopathy, PAF, COPD on 3L home O2, who presents to ED per recommendation of [facility], where he was being seen today for follow-up of his depression. Pt rode his bike to [place], but had taken off his home 3L O2 by [place] for the majority of the bike ride in effort to preserve the amount of oxygen he had in the tank. Pt had a O2 sat 97% on his 3L [place], 85% on [place]. Had chest tightness and worsening dyspnea on arrival to [facility] that resolved with 30 minutes of rest. States he always gets this when he rides his bike and takes off his oxygen. Resolved when he placed the oxygen back on. Received 324mg  ASA and 2 SL NTG by EMS en route with relief. No reoccurrence of CP since. Suspect stable angina, no unstable angina. This is likely his baseline. Delta troponin negative. Doubt dissection, doubt Pe.  Advised  to f/u with PCP this week for recheck and advised to return to ER for any new, worse, or concerning symptoms. Advised to wear the oxygen as prescribed and to avoid strenuous activity that causes chest discomfort. He demonstrates understanding of all this and comfort with d/c home.  Pt condition, course, and discharge were discussed with attending physician Dr. Margarita Grizzle.  Final Clinical Impressions(s) / ED Diagnoses  Final diagnoses:  Chest pain, unspecified type    New Prescriptions Discharge Medication List as of 11/13/2015  6:47 PM       Horald Pollen, MD 11/14/15 0981    Margarita Grizzle, MD 11/15/15 [phone]

## 2016-02-15 ENCOUNTER — Encounter (HOSPITAL_COMMUNITY): Payer: Self-pay

## 2016-02-15 ENCOUNTER — Inpatient Hospital Stay (HOSPITAL_COMMUNITY)
Admission: EM | Admit: 2016-02-15 | Discharge: 2016-02-17 | DRG: 190 | Disposition: A | Discharge status: Home / Self Care | Payer: Medicaid Other | Attending: Oncology | Admitting: Oncology

## 2016-02-15 ENCOUNTER — Emergency Department (HOSPITAL_COMMUNITY): Payer: Medicaid Other

## 2016-02-15 ENCOUNTER — Other Ambulatory Visit: Payer: Self-pay

## 2016-02-15 DIAGNOSIS — I509 Heart failure, unspecified: Secondary | ICD-10-CM

## 2016-02-15 DIAGNOSIS — F1010 Alcohol abuse, uncomplicated: Secondary | ICD-10-CM | POA: Diagnosis present

## 2016-02-15 DIAGNOSIS — Z7901 Long term (current) use of anticoagulants: Secondary | ICD-10-CM

## 2016-02-15 DIAGNOSIS — Z825 Family history of asthma and other chronic lower respiratory diseases: Secondary | ICD-10-CM

## 2016-02-15 DIAGNOSIS — J9621 Acute and chronic respiratory failure with hypoxia: Secondary | ICD-10-CM | POA: Diagnosis present

## 2016-02-15 DIAGNOSIS — J440 Chronic obstructive pulmonary disease with acute lower respiratory infection: Secondary | ICD-10-CM | POA: Diagnosis present

## 2016-02-15 DIAGNOSIS — I5022 Chronic systolic (congestive) heart failure: Secondary | ICD-10-CM | POA: Diagnosis present

## 2016-02-15 DIAGNOSIS — I255 Ischemic cardiomyopathy: Secondary | ICD-10-CM | POA: Diagnosis not present

## 2016-02-15 DIAGNOSIS — R55 Syncope and collapse: Secondary | ICD-10-CM | POA: Diagnosis present

## 2016-02-15 DIAGNOSIS — Z8669 Personal history of other diseases of the nervous system and sense organs: Secondary | ICD-10-CM

## 2016-02-15 DIAGNOSIS — J441 Chronic obstructive pulmonary disease with (acute) exacerbation: Secondary | ICD-10-CM | POA: Diagnosis not present

## 2016-02-15 DIAGNOSIS — Z7902 Long term (current) use of antithrombotics/antiplatelets: Secondary | ICD-10-CM

## 2016-02-15 DIAGNOSIS — Z79899 Other long term (current) drug therapy: Secondary | ICD-10-CM

## 2016-02-15 DIAGNOSIS — J181 Lobar pneumonia, unspecified organism: Secondary | ICD-10-CM

## 2016-02-15 DIAGNOSIS — E875 Hyperkalemia: Secondary | ICD-10-CM | POA: Diagnosis present

## 2016-02-15 DIAGNOSIS — Z7982 Long term (current) use of aspirin: Secondary | ICD-10-CM

## 2016-02-15 DIAGNOSIS — J209 Acute bronchitis, unspecified: Secondary | ICD-10-CM | POA: Diagnosis not present

## 2016-02-15 DIAGNOSIS — J9601 Acute respiratory failure with hypoxia: Secondary | ICD-10-CM | POA: Diagnosis not present

## 2016-02-15 DIAGNOSIS — J189 Pneumonia, unspecified organism: Secondary | ICD-10-CM | POA: Diagnosis not present

## 2016-02-15 DIAGNOSIS — Z9981 Dependence on supplemental oxygen: Secondary | ICD-10-CM

## 2016-02-15 DIAGNOSIS — I480 Paroxysmal atrial fibrillation: Secondary | ICD-10-CM | POA: Diagnosis present

## 2016-02-15 DIAGNOSIS — I2510 Atherosclerotic heart disease of native coronary artery without angina pectoris: Secondary | ICD-10-CM | POA: Diagnosis present

## 2016-02-15 DIAGNOSIS — Z7951 Long term (current) use of inhaled steroids: Secondary | ICD-10-CM

## 2016-02-15 DIAGNOSIS — K5900 Constipation, unspecified: Secondary | ICD-10-CM | POA: Diagnosis present

## 2016-02-15 DIAGNOSIS — I110 Hypertensive heart disease with heart failure: Secondary | ICD-10-CM | POA: Diagnosis present

## 2016-02-15 DIAGNOSIS — W57XXXA Bitten or stung by nonvenomous insect and other nonvenomous arthropods, initial encounter: Secondary | ICD-10-CM | POA: Diagnosis present

## 2016-02-15 DIAGNOSIS — F17210 Nicotine dependence, cigarettes, uncomplicated: Secondary | ICD-10-CM | POA: Diagnosis present

## 2016-02-15 DIAGNOSIS — Z8249 Family history of ischemic heart disease and other diseases of the circulatory system: Secondary | ICD-10-CM

## 2016-02-15 DIAGNOSIS — J9620 Acute and chronic respiratory failure, unspecified whether with hypoxia or hypercapnia: Secondary | ICD-10-CM

## 2016-02-15 DIAGNOSIS — Z836 Family history of other diseases of the respiratory system: Secondary | ICD-10-CM

## 2016-02-15 DIAGNOSIS — I5042 Chronic combined systolic (congestive) and diastolic (congestive) heart failure: Secondary | ICD-10-CM | POA: Diagnosis present

## 2016-02-15 DIAGNOSIS — Z23 Encounter for immunization: Secondary | ICD-10-CM

## 2016-02-15 LAB — COMPREHENSIVE METABOLIC PANEL
ALT: 15 U/L — ABNORMAL LOW (ref 17–63)
AST: 31 U/L (ref 15–41)
Albumin: 3.7 g/dL (ref 3.5–5.0)
Alkaline Phosphatase: 64 U/L (ref 38–126)
Anion gap: 9 (ref 5–15)
BUN: 12 mg/dL (ref 6–20)
CO2: 26 mmol/L (ref 22–32)
Calcium: 8.7 mg/dL — ABNORMAL LOW (ref 8.9–10.3)
Chloride: 101 mmol/L (ref 101–111)
Creatinine, Ser: 1.18 mg/dL (ref 0.61–1.24)
GFR calc Af Amer: >60 mL/min (ref >60)
GFR calc non Af Amer: >60 mL/min (ref >60)
Glucose, Bld: 110 mg/dL — ABNORMAL HIGH (ref 65–99)
Potassium: 5.4 mmol/L — ABNORMAL HIGH (ref 3.5–5.1)
Sodium: 136 mmol/L (ref 135–145)
Total Bilirubin: 1.3 mg/dL — ABNORMAL HIGH (ref 0.3–1.2)
Total Protein: 6 g/dL — ABNORMAL LOW (ref 6.5–8.1)

## 2016-02-15 LAB — CBC WITH DIFFERENTIAL/PLATELET
Basophils Absolute: 0 10*3/uL (ref 0.0–0.1)
Basophils Relative: 0 %
Eosinophils Absolute: 0.1 10*3/uL (ref 0.0–0.7)
Eosinophils Relative: 1 %
HCT: 40.6 % (ref 39.0–52.0)
Hemoglobin: 13.8 g/dL (ref 13.0–17.0)
Lymphocytes Relative: 16 %
Lymphs Abs: 1.4 10*3/uL (ref 0.7–4.0)
MCH: 31.9 pg (ref 26.0–34.0)
MCHC: 34 g/dL (ref 30.0–36.0)
MCV: 94 fL (ref 78.0–100.0)
Monocytes Absolute: 0.4 10*3/uL (ref 0.1–1.0)
Monocytes Relative: 5 %
Neutro Abs: 6.5 10*3/uL (ref 1.7–7.7)
Neutrophils Relative %: 78 %
Platelets: 239 10*3/uL (ref 150–400)
RBC: 4.32 MIL/uL (ref 4.22–5.81)
RDW: 13 % (ref 11.5–15.5)
WBC: 8.4 10*3/uL (ref 4.0–10.5)

## 2016-02-15 LAB — TROPONIN I: Troponin I: <0.03 ng/mL (ref <0.03)

## 2016-02-15 LAB — I-STAT TROPONIN, ED: Troponin i, poc: 0 ng/mL (ref 0.00–0.08)

## 2016-02-15 MED ORDER — FOLIC ACID 1 MG PO TABS
1.0000 mg | ORAL_TABLET | Freq: Every day | ORAL | Status: DC
  Administered 2016-02-15 – 2016-02-17 (×3): 1 mg via ORAL
  Filled 2016-02-15 (×3): qty 1

## 2016-02-15 MED ORDER — POLYETHYLENE GLYCOL 3350 17 G PO PACK
17.0000 g | PACK | Freq: Every day | ORAL | Status: DC
  Administered 2016-02-15 – 2016-02-17 (×3): 17 g via ORAL
  Filled 2016-02-15 (×3): qty 1

## 2016-02-15 MED ORDER — AMIODARONE HCL 200 MG PO TABS
100.0000 mg | ORAL_TABLET | Freq: Every day | ORAL | Status: DC
  Administered 2016-02-16 – 2016-02-17 (×2): 100 mg via ORAL
  Filled 2016-02-15 (×3): qty 1

## 2016-02-15 MED ORDER — SODIUM POLYSTYRENE SULFONATE 15 GM/60ML PO SUSP
15.0000 g | Freq: Once | ORAL | Status: AC
  Administered 2016-02-15: 15 g via ORAL
  Filled 2016-02-15: qty 60

## 2016-02-15 MED ORDER — LORAZEPAM 1 MG PO TABS: 1.0000 mg | ORAL_TABLET | Freq: Four times a day (QID) | ORAL | Status: DC | PRN

## 2016-02-15 MED ORDER — DICLOFENAC SODIUM 1 % TD GEL
2.0000 g | Freq: Four times a day (QID) | TRANSDERMAL | Status: DC
  Administered 2016-02-15 – 2016-02-17 (×5): 2 g via TOPICAL
  Filled 2016-02-15: qty 100

## 2016-02-15 MED ORDER — ACETAMINOPHEN 650 MG RE SUPP: 650.0000 mg | Freq: Four times a day (QID) | RECTAL | Status: DC | PRN

## 2016-02-15 MED ORDER — METOPROLOL SUCCINATE ER 50 MG PO TB24
50.0000 mg | ORAL_TABLET | Freq: Every day | ORAL | Status: DC
  Administered 2016-02-16 – 2016-02-17 (×2): 50 mg via ORAL
  Filled 2016-02-15 (×3): qty 1

## 2016-02-15 MED ORDER — ALBUTEROL SULFATE (2.5 MG/3ML) 0.083% IN NEBU: 2.5000 mg | INHALATION_SOLUTION | Freq: Four times a day (QID) | RESPIRATORY_TRACT | Status: DC | PRN

## 2016-02-15 MED ORDER — FUROSEMIDE 40 MG PO TABS
40.0000 mg | ORAL_TABLET | Freq: Every day | ORAL | Status: DC
  Administered 2016-02-16 – 2016-02-17 (×2): 40 mg via ORAL
  Filled 2016-02-15 (×3): qty 1

## 2016-02-15 MED ORDER — SENNOSIDES-DOCUSATE SODIUM 8.6-50 MG PO TABS
2.0000 | ORAL_TABLET | Freq: Every day | ORAL | Status: DC
  Administered 2016-02-15 – 2016-02-16 (×2): 2 via ORAL
  Filled 2016-02-15 (×2): qty 2

## 2016-02-15 MED ORDER — MOMETASONE FURO-FORMOTEROL FUM 100-5 MCG/ACT IN AERO
2.0000 | INHALATION_SPRAY | Freq: Two times a day (BID) | RESPIRATORY_TRACT | Status: DC
  Administered 2016-02-16 (×2): 2 via RESPIRATORY_TRACT
  Filled 2016-02-15: qty 8.8

## 2016-02-15 MED ORDER — ASPIRIN EC 81 MG PO TBEC
81.0000 mg | DELAYED_RELEASE_TABLET | Freq: Every day | ORAL | Status: DC
  Administered 2016-02-16 – 2016-02-17 (×2): 81 mg via ORAL
  Filled 2016-02-15 (×3): qty 1

## 2016-02-15 MED ORDER — NICOTINE 21 MG/24HR TD PT24
21.0000 mg | MEDICATED_PATCH | Freq: Every day | TRANSDERMAL | Status: DC
  Filled 2016-02-15 (×2): qty 1

## 2016-02-15 MED ORDER — ADULT MULTIVITAMIN W/MINERALS CH
1.0000 | ORAL_TABLET | Freq: Every day | ORAL | Status: DC
  Administered 2016-02-15 – 2016-02-17 (×3): 1 via ORAL
  Filled 2016-02-15 (×3): qty 1

## 2016-02-15 MED ORDER — LEVOFLOXACIN IN D5W 500 MG/100ML IV SOLN
500.0000 mg | Freq: Once | INTRAVENOUS | Status: AC
  Administered 2016-02-15: 500 mg via INTRAVENOUS
  Filled 2016-02-15: qty 100

## 2016-02-15 MED ORDER — THIAMINE HCL 100 MG/ML IJ SOLN
100.0000 mg | Freq: Every day | INTRAMUSCULAR | Status: DC
  Administered 2016-02-15: 100 mg via INTRAVENOUS
  Filled 2016-02-15: qty 2

## 2016-02-15 MED ORDER — LORAZEPAM 2 MG/ML IJ SOLN: 1.0000 mg | Freq: Four times a day (QID) | INTRAMUSCULAR | Status: DC | PRN

## 2016-02-15 MED ORDER — LOSARTAN POTASSIUM 50 MG PO TABS
25.0000 mg | ORAL_TABLET | Freq: Every day | ORAL | Status: DC
  Administered 2016-02-16 – 2016-02-17 (×2): 25 mg via ORAL
  Filled 2016-02-15 (×3): qty 1

## 2016-02-15 MED ORDER — VITAMIN B-1 100 MG PO TABS
100.0000 mg | ORAL_TABLET | Freq: Every day | ORAL | Status: DC
  Administered 2016-02-16 – 2016-02-17 (×2): 100 mg via ORAL
  Filled 2016-02-15 (×3): qty 1

## 2016-02-15 MED ORDER — ACETAMINOPHEN 325 MG PO TABS
650.0000 mg | ORAL_TABLET | Freq: Four times a day (QID) | ORAL | Status: DC | PRN
  Administered 2016-02-15: 650 mg via ORAL
  Filled 2016-02-15: qty 2

## 2016-02-15 MED ORDER — GUAIFENESIN-CODEINE 100-10 MG/5ML PO SOLN: 10.0000 mL | ORAL | Status: DC | PRN

## 2016-02-15 MED ORDER — DABIGATRAN ETEXILATE MESYLATE 150 MG PO CAPS
150.0000 mg | ORAL_CAPSULE | Freq: Two times a day (BID) | ORAL | Status: DC
  Administered 2016-02-15 – 2016-02-17 (×4): 150 mg via ORAL
  Filled 2016-02-15 (×4): qty 1

## 2016-02-15 NOTE — H&P (Signed)
Date: 02/15/2016               Patient Name:  Johnny Rodriguez MRN: [id number]  DOB: 08-May-1950 Age / Sex: 66 y.o., male   PCP: Gwenyth Bender, MD         Medical Service: Internal Medicine Teaching Service         Attending Physician: Dr. Levert Feinstein, MD    First Contact: Dr. Vincente Liberty Pager: [phone]  Second Contact: Dr. Johnny Bridge Pager: [phone]       After Hours (After 5p/  First Contact Pager: [phone]  weekends / holidays): Second Contact Pager: [phone]   Chief Complaint: chest pain  History of Present Illness:  Mr. Leavitt is a 66yo male with PMH of CAD, COPD on chronic oxygen, CHF, A fib, HTN, and tobacco abuse presenting for chest pain and shortness of breath. Patient has been having increased shortness of breath from baseline with exertion. Today patient was walking into an appointment at [facility] when he developed shortness of breath and sharp chest pain that radiate to left chest; he had to stop a couple of times on the sidewalk before reaching the door and it took a lot longer to recover than his baseline. When he was seen by therapist, EMS was called. Patient states he has also been having increased cough and increased sputum production (no hemoptysis) from baseline. He denies fevers, chills, sick contacts, myalgias, nausea, vomiting, diarrhea.   Patient has a history of unexplained syncope and is s/p loop recorder (removed in July 2017) that did not capture arrhythmias (but was not having syncope at the time loop recorder was implanted). Patient states he had an episode of witnessed syncope at a friend's house one month prior that lasted about 1 minutes, with some fogginess afterwards; patient admits to drinking at that time but not more than usual. He also had a syncopal episode day prior to admission from bed to floor; he admits to feeling slightly dizzy before but no chest pain or shortness of breath at the time that was different from baseline. He denies vision or hearing  changes, headaches, dizziness, focal weakness, or numbness and tingling.   He also complains of bloating, abdominal discomfort and constipation; last BM was about a week ago. He is still able to pass gas; he tried milk of magnesia without relief. He endorses good appetite and PO intake, denies melena or hematochezia.   Prior to arrival, EMS gave patient 324mg  ASA, 2x SL nitro, and 2 neb treatments. His symptoms improved after the nebulizer treatments. In the ED, patient was ambulated on home oxygen and desaturated to 80%.    Meds:  Current Meds  Medication Sig  . amiodarone (PACERONE) 200 MG tablet Take 0.5 tablets (100 mg total) by mouth daily. (Patient taking differently: Take 200 mg by mouth daily. )  . aspirin EC 81 MG tablet Take 81 mg by mouth daily.  . dabigatran (PRADAXA) 150 MG CAPS capsule Take 150 mg by mouth 2 (two) times daily.  . furosemide (LASIX) 40 MG tablet Take 40 mg by mouth daily.    Marland Kitchen losartan (COZAAR) 25 MG tablet Take 25 mg by mouth daily.  . metoprolol (TOPROL-XL) 50 MG 24 hr tablet Take 50 mg by mouth daily.   . mometasone-formoterol (DULERA) 100-5 MCG/ACT AERO Inhale 2 puffs into the lungs 2 (two) times daily.  Marland Kitchen PROAIR HFA 108 (90 Base) MCG/ACT inhaler Inhale 1 puff into the lungs every 6 (six) hours  as needed for wheezing.   Marland Kitchen spironolactone (ALDACTONE) 25 MG tablet Take 25 mg by mouth daily.   Allergies: Allergies as of 02/15/2016  . (No Known Allergies)   Past Medical History:  Diagnosis Date  . A-fib (HCC)   . AF (atrial fibrillation) (HCC)   . Alcohol abuse   . Angina pectoris   . Arrhythmia   . CAD (coronary artery disease) 12/15/2004  . Cardiomyopathy, idiopathic (HCC)   . CHF (congestive heart failure) (HCC)   . COPD (chronic obstructive pulmonary disease) (HCC)   . Coronary artery disease   . Heavy cigarette smoker   . HTN (hypertension)   . Idiopathic cardiomyopathy (HCC) 07/16/2009  . Non compliance with medical treatment   . Seizures (HCC)      Family History: Father with emphysema; mother died of MI at age 83  Social History: 150+ pack year smoking history; currently smokes 3ppd. Endorses occasional marijuana use; denies other illicit drug use. Endorses about weekly alcohol use at 12oz x 2 beers.   Review of Systems: A complete ROS was negative except as per HPI.   Physical Exam: Blood pressure 126/82, pulse 60, temperature 97.9 F (36.6 C), temperature source Oral, resp. rate 19, height 5\' 11"  (1.803 m), weight 150 lb (68 kg), SpO2 97 %. General: alert, well-developed, and cooperative to examination.  Head: normocephalic and atraumatic.  Eyes: vision grossly intact, pupils equal, pupils round, pupils reactive to light, no injection and anicteric.  Mouth: pharynx pink and moist, no erythema, + tonsillar exudate.  Neck: supple, full ROM, no thyromegaly, no JVD.  Lungs: normal respiratory effort, no accessory muscle use, decreased breath sounds throughout with focalization on LL field, mild anterior wheezing (none posterior), no crackles or rhonchi. Heart: normal rate, regular rhythm, no murmur, no gallop, and no rub.  Abdomen: Distended, diffusely tender with voluntary guarding, could not appreciate organomegaly   Msk: no joint swelling, no joint warmth, and no redness over joints. No tenderness of chest. Pulses: 2+ DP/PT pulses bilaterally Extremities: No cyanosis, clubbing, edema Neurologic: alert & oriented X3, cranial nerves II-XII grossly intact, strength normal in all extremities, sensation intact to light touch.  Skin: turgor normal; excoriations on bil LE Psych: Oriented X3, memory intact for recent and remote, normally interactive, good eye contact, not anxious appearing, and not depressed appearing  LABS: Na 136, K 5.4, Cl 101, CO2 26, BUN 12, Cr 1.18, Glu 110 WBC 8.4, Hgb 13.8, Hct 40.6, Plts 239 iStat troponin negative  EKG: Personally reviewed - Sinus rhythm, no acute ST segment or T wave changes  CXR:  Personally reviewed - small focus suggesting pneumonia in posterior LLL  Assessment & Plan by Problem: Active Problems:   CAP (community acquired pneumonia)  Community acquired pneumonia, COPD: Patient with increased productive cough, shortness of breath and hypoxia on exertion, and CXR suggestive of pneumonia. On exam he is afebrile, with minimal wheezing and no appreciable crackles. At home on 3L [place], currently on 4L [place]. --started on Levaquin in ED; continue antibiotic coverage of CAP --titrate oxygen to keep O2 sat 88-95%  Chest pain: Patient with pleuritic chest pain, and history of CAD. Non reproducible on exam. Initial troponin negative and EKG without ischemic changes. Patient is compliant with Pradexa and not had known recurrence of A fib since DCCV; SR on admission. --trend troponins.   Syncopal episodes: Patient with long history of unexplained syncopal episodes. He had a loop recorder but did not reveal any arrhythmia's during it's placement. Per  chart review, patient has h/o seizures though patient denies. He endorses marijuana use and occasional alcohol use. Etiology could be seizures, arrhythmias (has h/o A fib), vasovagal, etc. Currently seems neurologically intact. Will hold off on CT head unless neuro status changes. Patient is still being followed by cardiology for workup of these episodes. Patient compliant with pradexa and has not had recurrence of a fib since DCCV; SR on admission. --telemetry --orthostatic vital signs --repeat ECHO; last one in system in 2008  Atrial Fibrilation: Patient with history of PAF s/p DCCV on amiodarone and metoprolol. He is on Pradaxa for anticoagulation. On exam and EKG he is in sinus rhythm. CHA2DSVASc score of 3. --continue amiodarone 100mg  daily (per last cards note) --continue metoprolol 50mg  daily --continue Pradaxa 150mg  BID  CHF:  Patient with h/o of ischemic CHF, last echo that can be found was in 2008 showing LVEF 50-55%. Patient  on home regimen of spironolactone, losartan, furosemide, metoprolol. Patient hyperkalemic to 5.4 on admission. Patient endorses compliance with all medicines. Appears euvolemic on exam --continue metoprolol 50mg  daily, furosemide 40mg  daily --hold spironolactone for now in setting of hyperkalemia; can restart once normalizes.  --repeat ECHO inpatient if does not hold up discharge; otherwise can follow up outpatient.  COPD on home O2: Patient with h/o COPD, on continuous home O2 therapy at 3L, still smoking 2-3 packs per day. Home regimen includes albuterol and dulera. Patient given nebs en route with EMS. --continue albuterol q6PRN; dulera BID --[place] O2 supplementation to keep saturations 88-95%.   Smoking abuse: Patient with long history of heavy tobacco use. Not interested in cessation.  --nicotine patch while inpatient  H/o Alcohol Abuse: Patient with documented alcohol abuse, currently reports ~2 beers per week when he can afford them but admits to drinking more since he has been a little depressed lately. He denies h/o seizures but chart review reveals he has had them. --CIWA protocol  --folate, thiamine, multivitamin  Constipation: Patient with abdominal distention, discomfort and last BM one week prior.  --senecot-s, miralax  IVF: none Diet: HH - fluid restricted DVT: pradaxa Code: FULL  Dispo: Admit patient to Observation with expected length of stay less than 2 midnights.  Signed: Nyra Market, MD 02/15/2016, 8:13 PM  Pager [phone]

## 2016-02-15 NOTE — ED Provider Notes (Signed)
[facility]-EMERGENCY DEPT Provider Note   CSN: [id number] Arrival date & time: 02/15/16  1454     History   Chief Complaint Chief Complaint  Patient presents with  . Shortness of Breath    HPI Johnny Rodriguez is a 66 y.o. male.  HPI   Patient is a 66 year old male with history of CAD, cardiac appendectomy, CHF, COPD, CAD, current smoker on 3 L of home O2. Patient was sent here by my [place]. Reportedly patient went to [facility] appointment today and had some shortness of breath and chest pain. He received to do nebs prior to arrival. He now feels improved.  History has chronic dyspnea on walking. Patient follows with Dr. August Saucer. Patient has resources that are helping to try to get him into home rather than the motel that he is currently staying at.   Past Medical History:  Diagnosis Date  . A-fib (HCC)   . AF (atrial fibrillation) (HCC)   . Alcohol abuse   . Angina pectoris   . Arrhythmia   . CAD (coronary artery disease) 12/15/2004  . Cardiomyopathy, idiopathic (HCC)   . CHF (congestive heart failure) (HCC)   . COPD (chronic obstructive pulmonary disease) (HCC)   . Coronary artery disease   . Heavy cigarette smoker   . HTN (hypertension)   . Idiopathic cardiomyopathy (HCC) 07/16/2009  . Non compliance with medical treatment   . Seizures [facility])     Patient Active Problem List   Diagnosis Date Noted  . CAP (community acquired pneumonia) 02/15/2016  . Constipation   . COPD exacerbation (HCC) 02/08/2015  . Essential hypertension 02/08/2015  . Bed bug bite 02/08/2015  . Preop cardiovascular exam 09/28/2013  . Syncope 12/10/2011  . History of noncompliance with medical treatment 12/01/2011  . Chronic systolic heart failure (HCC) 04/10/2011  . Idiopathic cardiomyopathy (HCC) 07/16/2009  . Atrial fibrillation (HCC) 07/16/2009  . COPD 07/16/2009  . CAD (coronary artery disease) 12/15/2004    Past Surgical History:  Procedure Laterality Date  . CARDIAC CATHETERIZATION  2006  .  CARDIAC CATHETERIZATION  12/2004   Left   . CARDIOVERSION  01/21/2011   Procedure: CARDIOVERSION;  Surgeon: Darden Palmer., MD;  Location: [facility] OR;  Service: Cardiovascular;  Laterality: N/A;  . DOPPLER ECHOCARDIOGRAPHY  02/2011  . ELECTROPHYSIOLOGY STUDY N/A 12/16/2011   Procedure: ELECTROPHYSIOLOGY STUDY;  Surgeon: Marinus Maw, MD;  Location: [facility] CATH LAB;  Service: Cardiovascular;  Laterality: N/A;  . EP IMPLANTABLE DEVICE N/A 08/24/2015   Procedure: Loop Recorder Removal;  Surgeon: Marinus Maw, MD;  Location: [facility] INVASIVE CV LAB;  Service: Cardiovascular;  Laterality: N/A;  . fx collar bone    . NO PAST SURGERIES         Home Medications    Prior to Admission medications   Medication Sig Start Date End Date Taking? Authorizing Provider  amiodarone (PACERONE) 200 MG tablet Take 0.5 tablets (100 mg total) by mouth daily. Patient taking differently: Take 200 mg by mouth daily.  03/14/15  Yes Marinus Maw, MD  aspirin EC 81 MG tablet Take 81 mg by mouth daily.   Yes Historical Provider, MD  dabigatran (PRADAXA) 150 MG CAPS capsule Take 150 mg by mouth 2 (two) times daily.   Yes Historical Provider, MD  furosemide (LASIX) 40 MG tablet Take 40 mg by mouth daily.     Yes Historical Provider, MD  losartan (COZAAR) 25 MG tablet Take 25 mg by mouth daily.   Yes Historical Provider,  MD  metoprolol (TOPROL-XL) 50 MG 24 hr tablet Take 50 mg by mouth daily.    Yes Historical Provider, MD  mometasone-formoterol (DULERA) 100-5 MCG/ACT AERO Inhale 2 puffs into the lungs 2 (two) times daily. 02/11/15  Yes Ruben Im, MD  PROAIR HFA 108 [phone] Base) MCG/ACT inhaler Inhale 1 puff into the lungs every 6 (six) hours as needed for wheezing.  01/09/15  Yes Historical Provider, MD  spironolactone (ALDACTONE) 25 MG tablet Take 25 mg by mouth daily.   Yes Historical Provider, MD  ibuprofen (ADVIL,MOTRIN) 200 MG tablet Take 200 mg by mouth every 6 (six) hours as needed for moderate pain.    Historical  Provider, MD    Family History Family History  Problem Relation Age of Onset  . Suicidality Sister   . CVA Mother   . Stroke Mother   . Heart disease Father   . Coronary artery disease Father   . COPD Father     Social History Social History  Substance Use Topics  . Smoking status: Current Every Day Smoker    Packs/day: 2.00    Years: 38.00  . Smokeless tobacco: Never Used  . Alcohol use 0.0 oz/week     Allergies   Patient has no known allergies.   Review of Systems Review of Systems  Respiratory: Positive for shortness of breath. Negative for cough.   Cardiovascular: Positive for chest pain.  Neurological: Negative for syncope and weakness.  All other systems reviewed and are negative.    Physical Exam Updated Vital Signs BP 117/82   Pulse 71   Temp 98.8 F (37.1 C) (Oral)   Resp 19   Ht 5\' 11"  (1.803 m)   Wt 150 lb (68 kg)   SpO2 91%   BMI 20.92 kg/m   Physical Exam  Constitutional: He is oriented to person, place, and time. He appears well-developed and well-nourished.  HENT:  Head: Normocephalic.  Eyes: Conjunctivae are normal.  Cardiovascular: Normal rate.   Pulmonary/Chest: Effort normal and breath sounds normal. No respiratory distress. He has no wheezes.  Clear lungs. Not tachypneic. On 3 L home oxygen.  Neurological: He is oriented to person, place, and time.  Skin: Skin is warm and dry. He is not diaphoretic.  Psychiatric: He has a normal mood and affect. His behavior is normal.     ED Treatments / Results  Labs (all labs ordered are listed, but only abnormal results are displayed) Labs Reviewed  COMPREHENSIVE METABOLIC PANEL - Abnormal; Notable for the following:       Result Value   Potassium 5.4 (*)    Glucose, Bld 110 (*)    Calcium 8.7 (*)    Total Protein 6.0 (*)    ALT 15 (*)    Total Bilirubin 1.3 (*)    All other components within normal limits  CBC WITH DIFFERENTIAL/PLATELET  Rosezena Sensor, ED    EKG  EKG  Interpretation  Date/Time:  Friday February 15 2016 14:47:42 EST Ventricular Rate:  69 PR Interval:  154 QRS Duration: 90 QT Interval:  402 QTC Calculation: 430 R Axis:   -66 Text Interpretation:  Normal sinus rhythm Left axis deviation Pulmonary disease pattern Abnormal ECG Normal sinus rhythm Confirmed by Kandis Mannan ([id number]) on 02/15/2016 3:40:47 PM       Radiology Dg Chest 2 View  Result Date: 02/15/2016 CLINICAL DATA:  Chest pain EXAM: CHEST  2 VIEW COMPARISON:  November 13, 2015 FINDINGS: There is a small area of patchy infiltrate  in the posterior left base. Lungs elsewhere clear. Heart size and pulmonary vascular normal. No adenopathy. There is atherosclerotic calcification aorta. No bone lesions. IMPRESSION: Small area of infiltrate posterior left base. Lungs elsewhere clear. There is aortic atherosclerosis. Followup PA and lateral chest radiographs recommended in 3-4 weeks following trial of antibiotic therapy to ensure resolution and exclude underlying malignancy. Electronically Signed   By: Bretta Bang III M.D.   On: 02/15/2016 17:03    Procedures Procedures (including critical care time)  Medications Ordered in ED Medications  levofloxacin (LEVAQUIN) IVPB 500 mg (not administered)     Initial Impression / Assessment and Plan / ED Course  I have reviewed the triage vital signs and the nursing notes.  Pertinent labs & imaging results that were available during my care of the patient were reviewed by me and considered in my medical decision making (see chart for details).  Clinical Course     Patient is well-appearing 66 year old male with history of COPD CAD, alcohol use, psych disorder, 3 L on oxygen. Patient presented from [place]. Patient went to [facility] today and had some shortness of breath with chest pain with ambulating to his appointment.. She has history of the same. Patient unable to ambulate area far in his motel building secondary to shortness of  breath. Patient still smoking. Patient has to sit down when cooking or showering. He has follow-up with both Dr. August Saucer and has social work working on getting him housing placement. Patient has no chest pain now, mild shortness of breath. Duonebs resolved the pain. Sounds pleuritic in nature..  Patient's physical exam is normal now except for tachypnea. No wheezing, no rub rales or rhonchi.  6:16 PM Patient tried ambulate, failed. Even a coupel feet he became acutely dyspnic, desated to 80s and took 5 minutes to regain good oxygen sats.    Will need to admit.  CXR shows pna, will treat with levaquin.   Final Clinical Impressions(s) / ED Diagnoses   Final diagnoses:  Community acquired pneumonia of left lower lobe of lung [facility])    New Prescriptions New Prescriptions   No medications on file     Courteney Randall An, MD 02/15/16 1816

## 2016-02-15 NOTE — ED Notes (Signed)
Ambulated pt in hallway while checking O2. Pt is on 3L nasal cannula continuously. Pt started out at 95% sitting on side of bed. While ambulating in hallway pt was SOB and O2 dropped down to 80%. While heading back to room pt was diaphoretic,  labored breathing, and stated that walking made it worse. Has really bad cough after walking. Pt is now back in bed and on monitor. Nurse and doctor notified.

## 2016-02-15 NOTE — ED Triage Notes (Signed)
Pt presents therapist office c/o chest pain that began last night.  Pt describes as a tingling and pressure.  Ems reports pt had bilateral wheezing; pt received ASA 324mg , NTG x 2 and 2 neb treatments PTA.

## 2016-02-15 NOTE — ED Notes (Signed)
Pt returned from xray and connected to the monitor. Pt provided with [place] sandwich and water.

## 2016-02-15 NOTE — ED Notes (Signed)
Patient transported to X-ray 

## 2016-02-15 NOTE — ED Notes (Signed)
Attempted report x1. 

## 2016-02-15 NOTE — Progress Notes (Addendum)
Pt arrived floor, telemetry initiated, MD notified. Settled in the room. Will continue to monitor.  Pt on droplet precaution upon arrival to floor. Contac ted Resident on call if she wants the pt swabbed for flu, MD says to hold off till verification in the morning with day team. Will keep pt on contact. Will continue to monitor.

## 2016-02-15 NOTE — ED Notes (Signed)
ED Provider at bedside. 

## 2016-02-16 DIAGNOSIS — Z23 Encounter for immunization: Secondary | ICD-10-CM | POA: Diagnosis not present

## 2016-02-16 DIAGNOSIS — I480 Paroxysmal atrial fibrillation: Secondary | ICD-10-CM | POA: Diagnosis present

## 2016-02-16 DIAGNOSIS — F17210 Nicotine dependence, cigarettes, uncomplicated: Secondary | ICD-10-CM | POA: Diagnosis present

## 2016-02-16 DIAGNOSIS — Z8249 Family history of ischemic heart disease and other diseases of the circulatory system: Secondary | ICD-10-CM | POA: Diagnosis not present

## 2016-02-16 DIAGNOSIS — I5042 Chronic combined systolic (congestive) and diastolic (congestive) heart failure: Secondary | ICD-10-CM

## 2016-02-16 DIAGNOSIS — J440 Chronic obstructive pulmonary disease with acute lower respiratory infection: Secondary | ICD-10-CM | POA: Diagnosis present

## 2016-02-16 DIAGNOSIS — I255 Ischemic cardiomyopathy: Secondary | ICD-10-CM | POA: Diagnosis present

## 2016-02-16 DIAGNOSIS — R0602 Shortness of breath: Secondary | ICD-10-CM | POA: Diagnosis present

## 2016-02-16 DIAGNOSIS — J189 Pneumonia, unspecified organism: Secondary | ICD-10-CM | POA: Diagnosis present

## 2016-02-16 DIAGNOSIS — I2510 Atherosclerotic heart disease of native coronary artery without angina pectoris: Secondary | ICD-10-CM | POA: Diagnosis present

## 2016-02-16 DIAGNOSIS — J9601 Acute respiratory failure with hypoxia: Secondary | ICD-10-CM | POA: Diagnosis not present

## 2016-02-16 DIAGNOSIS — Z7982 Long term (current) use of aspirin: Secondary | ICD-10-CM | POA: Diagnosis not present

## 2016-02-16 DIAGNOSIS — Z7902 Long term (current) use of antithrombotics/antiplatelets: Secondary | ICD-10-CM | POA: Diagnosis not present

## 2016-02-16 DIAGNOSIS — Z7951 Long term (current) use of inhaled steroids: Secondary | ICD-10-CM | POA: Diagnosis not present

## 2016-02-16 DIAGNOSIS — E875 Hyperkalemia: Secondary | ICD-10-CM | POA: Diagnosis present

## 2016-02-16 DIAGNOSIS — W57XXXA Bitten or stung by nonvenomous insect and other nonvenomous arthropods, initial encounter: Secondary | ICD-10-CM | POA: Diagnosis present

## 2016-02-16 DIAGNOSIS — J441 Chronic obstructive pulmonary disease with (acute) exacerbation: Secondary | ICD-10-CM | POA: Diagnosis present

## 2016-02-16 DIAGNOSIS — Z79899 Other long term (current) drug therapy: Secondary | ICD-10-CM | POA: Diagnosis not present

## 2016-02-16 DIAGNOSIS — R55 Syncope and collapse: Secondary | ICD-10-CM | POA: Diagnosis present

## 2016-02-16 DIAGNOSIS — I5022 Chronic systolic (congestive) heart failure: Secondary | ICD-10-CM | POA: Diagnosis present

## 2016-02-16 DIAGNOSIS — Z825 Family history of asthma and other chronic lower respiratory diseases: Secondary | ICD-10-CM | POA: Diagnosis not present

## 2016-02-16 DIAGNOSIS — Z9981 Dependence on supplemental oxygen: Secondary | ICD-10-CM | POA: Diagnosis not present

## 2016-02-16 DIAGNOSIS — J9620 Acute and chronic respiratory failure, unspecified whether with hypoxia or hypercapnia: Secondary | ICD-10-CM

## 2016-02-16 DIAGNOSIS — J9621 Acute and chronic respiratory failure with hypoxia: Secondary | ICD-10-CM | POA: Diagnosis present

## 2016-02-16 DIAGNOSIS — K5900 Constipation, unspecified: Secondary | ICD-10-CM | POA: Diagnosis present

## 2016-02-16 DIAGNOSIS — F1010 Alcohol abuse, uncomplicated: Secondary | ICD-10-CM | POA: Diagnosis present

## 2016-02-16 DIAGNOSIS — I110 Hypertensive heart disease with heart failure: Secondary | ICD-10-CM | POA: Diagnosis present

## 2016-02-16 DIAGNOSIS — J209 Acute bronchitis, unspecified: Secondary | ICD-10-CM | POA: Diagnosis present

## 2016-02-16 DIAGNOSIS — F1021 Alcohol dependence, in remission: Secondary | ICD-10-CM

## 2016-02-16 LAB — BASIC METABOLIC PANEL
Anion gap: 9 (ref 5–15)
BUN: 13 mg/dL (ref 6–20)
CO2: 28 mmol/L (ref 22–32)
Calcium: 8.7 mg/dL — ABNORMAL LOW (ref 8.9–10.3)
Chloride: 101 mmol/L (ref 101–111)
Creatinine, Ser: 1.25 mg/dL — ABNORMAL HIGH (ref 0.61–1.24)
GFR calc Af Amer: >60 mL/min (ref >60)
GFR calc non Af Amer: 60 mL/min — ABNORMAL LOW (ref >60)
Glucose, Bld: 113 mg/dL — ABNORMAL HIGH (ref 65–99)
Potassium: 4 mmol/L (ref 3.5–5.1)
Sodium: 138 mmol/L (ref 135–145)

## 2016-02-16 LAB — TROPONIN I: Troponin I: <0.03 ng/mL (ref <0.03)

## 2016-02-16 MED ORDER — PNEUMOCOCCAL VAC POLYVALENT 25 MCG/0.5ML IJ INJ
0.5000 mL | INJECTION | INTRAMUSCULAR | Status: AC
  Administered 2016-02-17: 0.5 mL via INTRAMUSCULAR
  Filled 2016-02-16: qty 0.5

## 2016-02-16 MED ORDER — PREDNISONE 20 MG PO TABS
40.0000 mg | ORAL_TABLET | Freq: Every day | ORAL | Status: DC
  Administered 2016-02-16 – 2016-02-17 (×2): 40 mg via ORAL
  Filled 2016-02-16 (×2): qty 2

## 2016-02-16 MED ORDER — METHYLPREDNISOLONE SODIUM SUCC 125 MG IJ SOLR
125.0000 mg | Freq: Once | INTRAMUSCULAR | Status: AC
Start: 2016-02-16 — End: 2016-02-16
  Administered 2016-02-16: 125 mg via INTRAVENOUS
  Filled 2016-02-16: qty 2

## 2016-02-16 MED ORDER — INFLUENZA VAC SPLIT QUAD 0.5 ML IM SUSY
0.5000 mL | PREFILLED_SYRINGE | INTRAMUSCULAR | Status: AC
  Administered 2016-02-17: 0.5 mL via INTRAMUSCULAR
  Filled 2016-02-16: qty 0.5

## 2016-02-16 MED ORDER — FLEET ENEMA 7-19 GM/118ML RE ENEM
1.0000 | ENEMA | Freq: Once | RECTAL | Status: AC
  Administered 2016-02-16: 1 via RECTAL
  Filled 2016-02-16: qty 1

## 2016-02-16 NOTE — Progress Notes (Addendum)
   Subjective: [name] was seen and evaluated today after a wash-up in bed. This activity caused the patient considerable distress however had adequate saturation during this time. He reports his breathing has improved somewhat since presentation however still feels considerably short of breath. Reports that this cold weather usually causes an exacerbation.   Objective:  Vital signs in last 24 hours: Vitals:   02/15/16 2356 02/16/16 0455 02/16/16 0826 02/16/16 1044  BP: 119/83 125/76  (!) 122/91  Pulse: 78 65  91  Resp:  19  (!) 22  Temp:  98.4 F (36.9 C)    TempSrc:  Oral    SpO2:  98% 94%   Weight:      Height:       General: Chronically-ill appearing caucasian male, with head of bed maximally elevated. Was initially in some respiratory distress with accessory muscle use after strenuous activity. This resolved several minutes later and he was able to speak in complete sentences. HENT: EOMI. No conjunctival injection, icterus or ptosis. Oropharynx clear, mucous membranes moist. Poor dentition. Cardiovascular: IR IR. No murmur or rub appreciated. Pulmonary: Diminished breath sounds bilaterally with poor air movement. Wheezing appreciated on anterior chest and mildly posteriorly diffusely. No areas suspicious for consolidation. Hyperesonate to percussion.  Was using accessory muscles initially however subsided.  Abdomen: Soft, non-distended. +bowel sounds. Diffusely tender.   Extremities: No peripheral edema noted BL. Scattered excoriations present on BL LE. In different stages of healing.  Skin: Warm, dry. Psych: Mood normal and affect was mood congruent. Responds to questions appropriately.   Assessment/Plan:  Principal Problem:   Acute on chronic respiratory failure (HCC) Active Problems:   Atrial fibrillation (HCC)   Chronic systolic heart failure (HCC)   Bed bug bite   CAP (community acquired pneumonia)   Community acquired pneumonia  Acute Respiratory Failure with  Hypoxia, Community Acquired Pneumonia vs COPD exacerbation Patient here with worsening SOB, increased sputum production + cough as well as hypoxia with exertion. CXR somewhat suggestive of LLL PNA. At this point, question PNA vs COPD exacerbation as symptoms started promptly after walking in the cold, which usually causes flares of his chronic lung disease. On examination today he was still in distress however this was following a wash-up in bed. Distress subsided after several minutes without exertion. Still did not appear comfortable however.  -Pred 40 mg daily -PO Levaquin -Dulera BID -PRN albuterol nebs -- consider scheduling BD's -Titrate oxygen to keep O2 sat 88-95%  Ischemic Cardiomyopathy, Combined CHF Euvolemic on exam. Most recent echo available to me was in 2008 which showed an LVEF of 50-55%. Patient on Spironolactone 25mg , Losartan 25mg , Lasix and Metoprolol for management. -Holding spironolactone in setting of hyperkalemia (5.4) -Continuing Metoprolol 50 mg daily and Lasix 40 mg Po daily -Repeat echo  Tobacco Abuse 150+ pack-year hx. Encouraged cessation however not interested.  -Nicotine patch  History of Alcohol Abuse On CIWA. Has not required ativan at this time.  PAF In atrial fibrillation during examination. On pradaxa. Rate controlled presently.   Dispo: Anticipated discharge in approximately 2 day(s).   Johnny Molt, DO 02/16/2016, 11:10 AM Pager: [phone]  Cephas Darby, MD, FACP  Hematology-Oncology/Internal Medicine Patient examined. I concur with plan as recorded by resident physician Dr Toma Copier Rodriguez

## 2016-02-17 ENCOUNTER — Inpatient Hospital Stay (HOSPITAL_COMMUNITY): Payer: Medicaid Other

## 2016-02-17 MED ORDER — ALBUTEROL SULFATE (2.5 MG/3ML) 0.083% IN NEBU
2.5000 mg | INHALATION_SOLUTION | RESPIRATORY_TRACT | Status: DC
  Administered 2016-02-17: 2.5 mg via RESPIRATORY_TRACT
  Filled 2016-02-17: qty 3

## 2016-02-17 MED ORDER — LEVOFLOXACIN 500 MG PO TABS: 500.0000 mg | ORAL_TABLET | Freq: Every day | ORAL | 0 refills | Status: DC

## 2016-02-17 MED ORDER — PREDNISONE 20 MG PO TABS: 40.0000 mg | ORAL_TABLET | Freq: Every day | ORAL | 0 refills | Status: DC

## 2016-02-17 MED ORDER — LEVOFLOXACIN 500 MG PO TABS
500.0000 mg | ORAL_TABLET | Freq: Every day | ORAL | Status: DC
  Administered 2016-02-17: 500 mg via ORAL
  Filled 2016-02-17: qty 1

## 2016-02-17 NOTE — Progress Notes (Signed)
Pt. Ambulated in halls with NT and O2 @ 2l.  Sats. Said at 97% the entire time and pt. Was steady on his feet.  Paged MD @ [phone], return call from Dr. Laural Benes and informed of above.  Forbes Cellar, RN

## 2016-02-17 NOTE — Progress Notes (Signed)
   Subjective:  No acute events overnight, Denies any dyspnea, wheezing. Says nebulizers have been helping- feels like he wants to go home. Comfortably eating breakfast  Objective:  Vital signs in last 24 hours: Vitals:   02/16/16 2148 02/16/16 2333 02/17/16 0500 02/17/16 1056  BP:  115/72 125/86 109/78  Pulse:  71 65 60  Resp:  20 19   Temp:  97.6 F (36.4 C) 98.4 F (36.9 C)   TempSrc:  Oral Oral   SpO2: 96% 98% 95%   Weight:      Height:       General: in no acute distress, eating breakfast Cardiovascular: IR IR. No murmur or rub appreciated. Pulmonary: Good airmovt, wheezing improved from yesterday Abdomen: Soft, non-distended. +bowel sounds. Diffusely tender.    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 On chronic home 3 L oxygen. Respiratory status has improved after steroids started yesterday and today he was comfortable and speaking in full sentences and on 3 L oxygen.   -Pred 40 mg daily x 5 days  -PO Levaquin -Dulera BID -albuterol nebs -- consider scheduling BD's -Titrate oxygen to keep O2 sat 88-95%  Discharge home today   Ischemic Cardiomyopathy, Combined CHF : Euvolemic on exam. Most recent echo available to me was in 2008 which showed an LVEF of 50-55%. Patient on Spironolactone 25mg , Losartan 25mg , Lasix and Metoprolol for management. -Holding spironolactone in setting of hyperkalemia (5.4)- resume on discharge  -Continuing Metoprolol 50 mg daily and Lasix 40 mg Po daily  Tobacco Abuse 150+ pack-year hx. Encouraged cessation however not interested -Nicotine patch  PAF- On pradaxa. Rate controlled presently.   Dispo: Anticipated discharge  in approximately 2 day(s).   Deneise Lever, MD 02/17/2016, 11:45 AM

## 2016-02-17 NOTE — Progress Notes (Signed)
Johnny Rodriguez discharged Home per MD order.  Discharge instructions reviewed and discussed with the patient, all questions and concerns answered. Copy of instructions and carenotes given to patient.  Allergies as of 02/17/2016   No Known Allergies     Medication List    TAKE these medications   amiodarone 200 MG tablet Commonly known as:  PACERONE Take 0.5 tablets (100 mg total) by mouth daily. What changed:  how much to take   aspirin EC 81 MG tablet Take 81 mg by mouth daily.   dabigatran 150 MG Caps capsule Commonly known as:  PRADAXA Take 150 mg by mouth 2 (two) times daily.   furosemide 40 MG tablet Commonly known as:  LASIX Take 40 mg by mouth daily.   ibuprofen 200 MG tablet Commonly known as:  ADVIL,MOTRIN Take 200 mg by mouth every 6 (six) hours as needed for moderate pain.   levofloxacin 500 MG tablet Commonly known as:  LEVAQUIN Take 1 tablet (500 mg total) by mouth daily. Until 1/11   losartan 25 MG tablet Commonly known as:  COZAAR Take 25 mg by mouth daily.   metoprolol succinate 50 MG 24 hr tablet Commonly known as:  TOPROL-XL Take 50 mg by mouth daily.   mometasone-formoterol 100-5 MCG/ACT Aero Commonly known as:  DULERA Inhale 2 puffs into the lungs 2 (two) times daily.   predniSONE 20 MG tablet Commonly known as:  DELTASONE Take 2 tablets (40 mg total) by mouth daily with breakfast. For 4 more days Start taking on:  02/18/2016   PROAIR HFA 108 (90 Base) MCG/ACT inhaler Generic drug:  albuterol Inhale 1 puff into the lungs every 6 (six) hours as needed for wheezing.   spironolactone 25 MG tablet Commonly known as:  ALDACTONE Take 25 mg by mouth daily.       Patients skin is clean, dry and intact, no evidence of skin break down. IV site discontinued and catheter remains intact. Site without signs and symptoms of complications. Dressing and pressure applied.  Patient escorted to car by NT in a wheelchair,  no distress noted upon  discharge.  Laural Benes, Juliocesar Blasius C 02/17/2016 3:48 PM

## 2016-02-17 NOTE — Discharge Summary (Signed)
Name: Johnny Rodriguez MRN: [id number] DOB: 1950-12-31 66 y.o. PCP: Johnny Bender, MD  Date of Admission: 02/15/2016  2:54 PM Date of Discharge: 02/17/2016 Attending Physician: Johnny Feinstein, MD  Discharge Diagnosis: 1. 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    aspirin EC 81 MG tablet Take 81 mg by mouth daily.   dabigatran 150 MG Caps capsule Commonly known as:  PRADAXA Take 150 mg by mouth 2 (two) times daily.   furosemide 40 MG tablet Commonly known as:  LASIX Take 40 mg by mouth daily.   ibuprofen 200 MG tablet Commonly known as:  ADVIL,MOTRIN Take 200 mg by mouth every 6 (six) hours as needed for moderate pain.   levofloxacin 500 MG tablet Commonly known as:  LEVAQUIN Take 1 tablet (500 mg total) by mouth daily. Until 1/11   losartan 25 MG tablet Commonly known as:  COZAAR Take 25 mg by mouth daily.   metoprolol succinate 50 MG 24 hr tablet Commonly known as:  TOPROL-XL Take 50 mg by mouth daily.   mometasone-formoterol 100-5 MCG/ACT Aero Commonly known as:  DULERA Inhale 2 puffs into the lungs 2 (two) times daily.   predniSONE 20 MG tablet Commonly known as:  DELTASONE Take 2 tablets (40 mg total) by mouth daily with breakfast. For 4 more days Start taking on:  02/18/2016   PROAIR HFA 108 (90 Base) MCG/ACT inhaler Generic drug:  albuterol Inhale 1 puff into the lungs every 6 (six) hours as needed for wheezing.   spironolactone 25 MG tablet Commonly known as:  ALDACTONE Take 25 mg  by mouth daily.       Disposition and follow-up:   Mr.Johnny Rodriguez was discharged from [facility] in Good condition.  At the hospital follow up visit please address:  1.  COPD exacerbation: has the pt finished his steroids and antibiotics  Combined CHF- please recheck BMET as pt will have resumed his spironolactone and losartan. He had some hyperkalemia at 5.4  2.  Labs / imaging needed at time of follow-up: BMET  3.  Pending labs/ test needing follow-up:   Follow-up Appointments:   Hospital Course by problem list: Principal Problem:   Acute on chronic respiratory failure (HCC) Active Problems:   Atrial fibrillation (HCC)   Chronic systolic heart failure (HCC)   Bed bug bite   CAP (community acquired pneumonia)   Community acquired pneumonia   1. Acute exacerbation of end stage oxygen dependent COPD: precipitated by unplanned exercise in the cold. On chronic home 3 L oxygen. Respiratory status has improved after steroids started and he was also started on levaquin and sent home on a short course of the same. His O2 sats remained in the normal range at the time of discharge on his home oxygen settings.   Chest pain: He presented with pleuritic chest pain and history of CAD, non reproducible on exam. Troponins were negative and EKG without ischemic changes. Pt is compliant with pradaxa  for his afib.   Syncopal episodes:Patient with long history of unexplained syncopal episodes. He had a loop recorder but did not reveal any arrhythmia's during it's placement. Per chart review, patient has h/o seizures though patient denies. He endorses marijuana use and occasional alcohol use. Etiology could be seizures, arrhythmias (has h/o A fib), vasovagal, etc. He wasneurologically intact.  Patient is still being followed by cardiology for workup of these episodes. Patient compliant with pradexa and has not had recurrence of a fib since DCCV; SR on admission.  He had no syncopal  episode during this admission.  Afib: He was in NSR. He is s/p DCCV on amio and metop as well as pradaxa. We continued all 3.  Smoking: nicotine patch  Alcohol use: He was put on CIWA but it was not needed as he was not in withdrawal.        Discharge Vitals:   BP 109/78   Pulse 60   Temp 98.4 F (36.9 C) (Oral)   Resp 19   Ht 5\' 11"  (1.803 m)   Wt 150 lb (68 kg)   SpO2 95%   BMI 20.92 kg/m   Pertinent Labs, Studies, and Procedures:  As above.  Discharge Instructions: Discharge Instructions    Diet - low sodium heart healthy    Complete by:  As directed    Increase activity slowly    Complete by:  As directed       Signed: Deneise Lever, MD 02/17/2016, 12:05 PM

## 2016-03-12 ENCOUNTER — Institutional Professional Consult (permissible substitution): Payer: Medicaid Other | Admitting: Emergency Medicine

## 2016-03-18 ENCOUNTER — Encounter: Payer: Self-pay | Admitting: Cardiology

## 2016-03-18 DIAGNOSIS — I428 Other cardiomyopathies: Secondary | ICD-10-CM

## 2016-03-18 DIAGNOSIS — Z87898 Personal history of other specified conditions: Secondary | ICD-10-CM

## 2016-03-18 DIAGNOSIS — I429 Cardiomyopathy, unspecified: Secondary | ICD-10-CM

## 2016-03-18 DIAGNOSIS — I480 Paroxysmal atrial fibrillation: Secondary | ICD-10-CM

## 2016-03-18 DIAGNOSIS — Z7901 Long term (current) use of anticoagulants: Secondary | ICD-10-CM

## 2016-03-19 ENCOUNTER — Other Ambulatory Visit: Payer: Self-pay | Admitting: Cardiology

## 2016-03-19 DIAGNOSIS — J42 Unspecified chronic bronchitis: Secondary | ICD-10-CM

## 2016-04-04 ENCOUNTER — Institutional Professional Consult (permissible substitution): Payer: Medicaid Other | Admitting: Pulmonary Disease

## 2016-04-15 ENCOUNTER — Encounter: Payer: Self-pay | Admitting: Pulmonary Disease

## 2016-05-19 ENCOUNTER — Encounter (HOSPITAL_COMMUNITY): Payer: Self-pay | Admitting: *Deleted

## 2016-05-19 ENCOUNTER — Emergency Department (HOSPITAL_COMMUNITY)
Admission: EM | Admit: 2016-05-19 | Discharge: 2016-05-19 | Disposition: A | Discharge status: Home / Self Care | Payer: Medicaid Other | Attending: Emergency Medicine | Admitting: Emergency Medicine

## 2016-05-19 ENCOUNTER — Emergency Department (HOSPITAL_COMMUNITY): Payer: Medicaid Other

## 2016-05-19 DIAGNOSIS — I509 Heart failure, unspecified: Secondary | ICD-10-CM | POA: Insufficient documentation

## 2016-05-19 DIAGNOSIS — I2510 Atherosclerotic heart disease of native coronary artery without angina pectoris: Secondary | ICD-10-CM | POA: Diagnosis not present

## 2016-05-19 DIAGNOSIS — I110 Hypertensive heart disease with heart failure: Secondary | ICD-10-CM | POA: Insufficient documentation

## 2016-05-19 DIAGNOSIS — J441 Chronic obstructive pulmonary disease with (acute) exacerbation: Secondary | ICD-10-CM | POA: Diagnosis not present

## 2016-05-19 DIAGNOSIS — F17200 Nicotine dependence, unspecified, uncomplicated: Secondary | ICD-10-CM | POA: Diagnosis not present

## 2016-05-19 DIAGNOSIS — R0602 Shortness of breath: Secondary | ICD-10-CM | POA: Diagnosis present

## 2016-05-19 DIAGNOSIS — Z7982 Long term (current) use of aspirin: Secondary | ICD-10-CM | POA: Diagnosis not present

## 2016-05-19 DIAGNOSIS — Z79899 Other long term (current) drug therapy: Secondary | ICD-10-CM | POA: Insufficient documentation

## 2016-05-19 LAB — CBC WITH DIFFERENTIAL/PLATELET
Basophils Absolute: 0 10*3/uL (ref 0.0–0.1)
Basophils Relative: 0 %
Eosinophils Absolute: 0.1 10*3/uL (ref 0.0–0.7)
Eosinophils Relative: 2 %
HCT: 43.6 % (ref 39.0–52.0)
Hemoglobin: 14 g/dL (ref 13.0–17.0)
Lymphocytes Relative: 27 %
Lymphs Abs: 1.5 10*3/uL (ref 0.7–4.0)
MCH: 30.7 pg (ref 26.0–34.0)
MCHC: 32.1 g/dL (ref 30.0–36.0)
MCV: 95.6 fL (ref 78.0–100.0)
Monocytes Absolute: 0.5 10*3/uL (ref 0.1–1.0)
Monocytes Relative: 9 %
Neutro Abs: 3.3 10*3/uL (ref 1.7–7.7)
Neutrophils Relative %: 62 %
Platelets: 198 10*3/uL (ref 150–400)
RBC: 4.56 MIL/uL (ref 4.22–5.81)
RDW: 13.4 % (ref 11.5–15.5)
WBC: 5.4 10*3/uL (ref 4.0–10.5)

## 2016-05-19 LAB — COMPREHENSIVE METABOLIC PANEL
ALT: 12 U/L — ABNORMAL LOW (ref 17–63)
AST: 15 U/L (ref 15–41)
Albumin: 4.2 g/dL (ref 3.5–5.0)
Alkaline Phosphatase: 58 U/L (ref 38–126)
Anion gap: 11 (ref 5–15)
BUN: 10 mg/dL (ref 6–20)
CO2: 29 mmol/L (ref 22–32)
Calcium: 9.5 mg/dL (ref 8.9–10.3)
Chloride: 101 mmol/L (ref 101–111)
Creatinine, Ser: 1.09 mg/dL (ref 0.61–1.24)
GFR calc Af Amer: >60 mL/min (ref >60)
GFR calc non Af Amer: >60 mL/min (ref >60)
Glucose, Bld: 105 mg/dL — ABNORMAL HIGH (ref 65–99)
Potassium: 4.8 mmol/L (ref 3.5–5.1)
Sodium: 141 mmol/L (ref 135–145)
Total Bilirubin: 0.4 mg/dL (ref 0.3–1.2)
Total Protein: 6.6 g/dL (ref 6.5–8.1)

## 2016-05-19 LAB — BRAIN NATRIURETIC PEPTIDE: B Natriuretic Peptide: 5.2 pg/mL (ref 0.0–100.0)

## 2016-05-19 MED ORDER — ALBUTEROL SULFATE (2.5 MG/3ML) 0.083% IN NEBU
5.0000 mg | INHALATION_SOLUTION | Freq: Once | RESPIRATORY_TRACT | Status: AC
  Administered 2016-05-19: 5 mg via RESPIRATORY_TRACT
  Filled 2016-05-19: qty 6

## 2016-05-19 MED ORDER — IPRATROPIUM BROMIDE 0.02 % IN SOLN
0.5000 mg | Freq: Once | RESPIRATORY_TRACT | Status: AC
  Administered 2016-05-19: 0.5 mg via RESPIRATORY_TRACT
  Filled 2016-05-19: qty 2.5

## 2016-05-19 MED ORDER — LEVOFLOXACIN 500 MG PO TABS
500.0000 mg | ORAL_TABLET | Freq: Once | ORAL | Status: AC
  Administered 2016-05-19: 500 mg via ORAL
  Filled 2016-05-19: qty 1

## 2016-05-19 MED ORDER — MAGNESIUM SULFATE 2 GM/50ML IV SOLN
2.0000 g | Freq: Once | INTRAVENOUS | Status: AC
  Administered 2016-05-19: 2 g via INTRAVENOUS
  Filled 2016-05-19: qty 50

## 2016-05-19 MED ORDER — PREDNISONE 20 MG PO TABS: 40.0000 mg | ORAL_TABLET | Freq: Every day | ORAL | 0 refills | Status: DC

## 2016-05-19 MED ORDER — PREDNISONE 20 MG PO TABS
60.0000 mg | ORAL_TABLET | Freq: Once | ORAL | Status: AC
  Administered 2016-05-19: 60 mg via ORAL
  Filled 2016-05-19: qty 3

## 2016-05-19 MED ORDER — ALBUTEROL SULFATE HFA 108 (90 BASE) MCG/ACT IN AERS
2.0000 | INHALATION_SPRAY | RESPIRATORY_TRACT | Status: DC | PRN
  Administered 2016-05-19: 2 via RESPIRATORY_TRACT
  Filled 2016-05-19: qty 6.7

## 2016-05-19 MED ORDER — LEVOFLOXACIN 500 MG PO TABS: 500.0000 mg | ORAL_TABLET | Freq: Every day | ORAL | 0 refills | Status: DC

## 2016-05-19 NOTE — ED Provider Notes (Signed)
[facility]-EMERGENCY DEPT Provider Note   CSN: [id number] Arrival date & time: 05/19/16  1736     History   Chief Complaint Chief Complaint  Patient presents with  . Shortness of Breath    HPI Johnny Rodriguez is a 66 y.o. male.  Patient is a 66 year old male with a history of end-stage COPD on 3 L of oxygen at home, cardiomyopathy coronary artery disease recently hospitalized with COPD exacerbation, respiratory failure and pneumonia at the beginning of January presenting today by EMS for shortness of breath. Patient states over the last 2 days he's had new green sputum production but today when he walked out to the transport bus to see his doctor for his routine follow up he became very short of breath and ended up calling 911. He used the remainder of his inhalers this morning and was following up with his doctor to get refills. He is complaining of a fleeting scratchy chest pain throughout his chest that moves but denies any radiation to the back. He is also complaining of abdominal pain that is worse with coughing and movement. He denies any recent leg swelling. He does admit to still smoking cigarettes but is also taking his medications as prescribed.   The history is provided by the patient.  Shortness of Breath  This is a recurrent problem. The average episode lasts 4 hours. The problem occurs continuously.The current episode started 3 to 5 hours ago (started when walking out to the [employer] bus). The problem has been gradually improving. Associated symptoms include cough, sputum production, wheezing, chest pain and abdominal pain. Pertinent negatives include no fever, no rhinorrhea, no sore throat, no neck pain, no syncope, no vomiting, no leg pain and no leg swelling. Associated symptoms comments: Recent green sputum over the last 2 days. It is unknown what precipitated the problem. Risk factors include smoking. He has tried ipratropium inhalers and beta-agonist inhalers for the symptoms. The  treatment provided no relief. He has had prior hospitalizations. He has had prior ED visits. Associated medical issues include COPD, CAD and heart failure. Associated medical issues comments: current smoker.    Past Medical History:  Diagnosis Date  . A-fib (HCC)   . AF (atrial fibrillation) (HCC)   . Alcohol abuse   . Angina pectoris   . Arrhythmia   . CAD (coronary artery disease) 12/15/2004  . Cardiomyopathy, idiopathic (HCC)   . CHF (congestive heart failure) (HCC)   . COPD (chronic obstructive pulmonary disease) (HCC)   . Coronary artery disease   . Heavy cigarette smoker   . HTN (hypertension)   . Idiopathic cardiomyopathy (HCC) 07/16/2009  . Non compliance with medical treatment   . Seizures [facility])     Patient Active Problem List   Diagnosis Date Noted  . Acute on chronic respiratory failure (HCC) 02/16/2016  . CAP (community acquired pneumonia) 02/15/2016  . Community acquired pneumonia 02/15/2016  . Constipation   . COPD exacerbation (HCC) 02/08/2015  . Essential hypertension 02/08/2015  . Bed bug bite 02/08/2015  . Preop cardiovascular exam 09/28/2013  . Syncope 12/10/2011  . History of noncompliance with medical treatment 12/01/2011  . Chronic systolic heart failure (HCC) 04/10/2011  . Idiopathic cardiomyopathy (HCC) 07/16/2009  . Atrial fibrillation (HCC) 07/16/2009  . COPD 07/16/2009  . CAD (coronary artery disease) 12/15/2004    Past Surgical History:  Procedure Laterality Date  . CARDIAC CATHETERIZATION  2006  . CARDIAC CATHETERIZATION  12/2004   Left   . CARDIOVERSION  01/21/2011  Procedure: CARDIOVERSION;  Surgeon: Darden Palmer., MD;  Location: [facility] OR;  Service: Cardiovascular;  Laterality: N/A;  . DOPPLER ECHOCARDIOGRAPHY  02/2011  . ELECTROPHYSIOLOGY STUDY N/A 12/16/2011   Procedure: ELECTROPHYSIOLOGY STUDY;  Surgeon: Marinus Maw, MD;  Location: [facility] CATH LAB;  Service: Cardiovascular;  Laterality: N/A;  . EP IMPLANTABLE DEVICE N/A 08/24/2015    Procedure: Loop Recorder Removal;  Surgeon: Marinus Maw, MD;  Location: [facility] INVASIVE CV LAB;  Service: Cardiovascular;  Laterality: N/A;  . fx collar bone    . NO PAST SURGERIES         Home Medications    Prior to Admission medications   Medication Sig Start Date End Date Taking? Authorizing Provider  amiodarone (PACERONE) 200 MG tablet Take 0.5 tablets (100 mg total) by mouth daily. Patient taking differently: Take 200 mg by mouth daily.  03/14/15   Marinus Maw, MD  aspirin EC 81 MG tablet Take 81 mg by mouth daily.    Historical Provider, MD  dabigatran (PRADAXA) 150 MG CAPS capsule Take 150 mg by mouth 2 (two) times daily.    Historical Provider, MD  furosemide (LASIX) 40 MG tablet Take 40 mg by mouth daily.      Historical Provider, MD  ibuprofen (ADVIL,MOTRIN) 200 MG tablet Take 200 mg by mouth every 6 (six) hours as needed for moderate pain.    Historical Provider, MD  levofloxacin (LEVAQUIN) 500 MG tablet Take 1 tablet (500 mg total) by mouth daily. Until 1/11 02/17/16   Deneise Lever, MD  losartan (COZAAR) 25 MG tablet Take 25 mg by mouth daily.    Historical Provider, MD  metoprolol (TOPROL-XL) 50 MG 24 hr tablet Take 50 mg by mouth daily.     Historical Provider, MD  mometasone-formoterol (DULERA) 100-5 MCG/ACT AERO Inhale 2 puffs into the lungs 2 (two) times daily. 02/11/15   Ruben Im, MD  predniSONE (DELTASONE) 20 MG tablet Take 2 tablets (40 mg total) by mouth daily with breakfast. For 4 more days 02/18/16   Deneise Lever, MD  PROAIR HFA 108 (90 Base) MCG/ACT inhaler Inhale 1 puff into the lungs every 6 (six) hours as needed for wheezing.  01/09/15   Historical Provider, MD  spironolactone (ALDACTONE) 25 MG tablet Take 25 mg by mouth daily.    Historical Provider, MD    Family History Family History  Problem Relation Age of Onset  . Suicidality Sister   . CVA Mother   . Stroke Mother   . Heart disease Father   . Coronary artery disease Father   . COPD Father      Social History Social History  Substance Use Topics  . Smoking status: Current Every Day Smoker    Packs/day: 2.00    Years: 38.00  . Smokeless tobacco: Never Used  . Alcohol use 0.0 oz/week     Allergies   Patient has no known allergies.   Review of Systems Review of Systems  Constitutional: Negative for fever.  HENT: Negative for rhinorrhea and sore throat.   Respiratory: Positive for cough, sputum production, shortness of breath and wheezing.   Cardiovascular: Positive for chest pain. Negative for leg swelling and syncope.  Gastrointestinal: Positive for abdominal pain. Negative for vomiting.       Patient is a bit of a poor historian but does admit to swelling and tenderness in his abdomen. Unclear how long the swelling has been going on but states abdominal pain started today with his breathing issues.  Musculoskeletal: Negative for neck pain.  All other systems reviewed and are negative.    Physical Exam Updated Vital Signs BP 123/80 (BP Location: Right Arm)   Pulse 74   Temp 98.3 F (36.8 C) (Oral)   Resp (!) 22   Ht 5\' 11"  (1.803 m)   Wt 150 lb (68 kg)   SpO2 (S) 97% Comment: 8 liters breathing treatment Simultaneous filing. User may not have seen previous data.  BMI 20.92 kg/m   Physical Exam  Constitutional: He is oriented to person, place, and time. He appears well-developed and well-nourished. No distress.  HENT:  Head: Normocephalic and atraumatic.  Mouth/Throat: Oropharynx is clear and moist.  Eyes: Conjunctivae and EOM are normal. Pupils are equal, round, and reactive to light.  Neck: Normal range of motion. Neck supple.  Cardiovascular: Normal rate, regular rhythm and intact distal pulses.   No murmur heard. Pulmonary/Chest: Effort normal. No respiratory distress. He has wheezes. He has no rales.  Expiratory wheezing in all lung fields  Abdominal: Soft. He exhibits no distension. There is tenderness. There is guarding. There is no rebound.   Diffuse abdominal tenderness and guarding in all quadrants. No significant ascites present no palpable pulsating mass  Musculoskeletal: Normal range of motion. He exhibits no edema or tenderness.  Neurological: He is alert and oriented to person, place, and time.  Skin: Skin is warm and dry. No rash noted. No erythema.  Psychiatric: He has a normal mood and affect. His behavior is normal.  Nursing note and vitals reviewed.    ED Treatments / Results  Labs (all labs ordered are listed, but only abnormal results are displayed) Labs Reviewed  COMPREHENSIVE METABOLIC PANEL - Abnormal; Notable for the following:       Result Value   Glucose, Bld 105 (*)    ALT 12 (*)    All other components within normal limits  CBC WITH DIFFERENTIAL/PLATELET  BRAIN NATRIURETIC PEPTIDE  I-STAT TROPOININ, ED    EKG  EKG Interpretation  Date/Time:  Monday May 19 2016 18:13:43 EDT Ventricular Rate:  71 PR Interval:    QRS Duration: 97 QT Interval:  386 QTC Calculation: 420 R Axis:   -58 Text Interpretation:  Sinus rhythm Left anterior fascicular block Borderline low voltage, extremity leads Nonspecific T abnormalities, lateral leads roaming baseline No significant change since last tracing Confirmed by Anitra Lauth  MD, Alphonzo Lemmings ([id number]) on 05/19/2016 6:39:08 PM       Radiology Dg Chest 2 View  Result Date: 05/19/2016 CLINICAL DATA:  Initial evaluation for acute shortness of breath. EXAM: CHEST  2 VIEW COMPARISON:  Prior radiograph from 02/15/2016. FINDINGS: Cardiac and mediastinal silhouettes are stable in size and contour, and remain within normal limits. Aortic atherosclerosis present. Lungs mildly hyperinflated with changes related to COPD present. No focal infiltrates. No pulmonary edema or pleural effusion. No pneumothorax. No acute osseous abnormality. Remotely healed left-sided rib fractures noted, stable. IMPRESSION: 1. No active cardiopulmonary disease. 2. COPD. 3. Aortic atherosclerosis.  Electronically Signed   By: Rise Mu M.D.   On: 05/19/2016 18:53    Procedures Procedures (including critical care time)  Medications Ordered in ED Medications  predniSONE (DELTASONE) tablet 60 mg (not administered)  albuterol (PROVENTIL) (2.5 MG/3ML) 0.083% nebulizer solution 5 mg (not administered)  ipratropium (ATROVENT) nebulizer solution 0.5 mg (not administered)  magnesium sulfate IVPB 2 g 50 mL (not administered)     Initial Impression / Assessment and Plan / ED Course  I have reviewed the triage  vital signs and the nursing notes.  Pertinent labs & imaging results that were available during my care of the patient were reviewed by me and considered in my medical decision making (see chart for details).     Patient is a 66 year old male multiple medical problems presenting today with shortness of breath, coughing, new productive cough as well as abdominal pain. Prior history of COPD, pneumonia in January and history of CHF. No significant findings of fluid overload mild distention of the abdomen but significant abdominal tenderness. Vital signs within normal limits and patient able to speak in full sentences. He is still wheezing on exam after having 2 nebulized treatments by EMS.  Will treat for COPD exacerbation but also evaluate for recurrent pneumonia, CHF exacerbation. Also will do bedside ultrasound to ensure no evidence of aortic aneurysm.   Patient given another albuterol, Atrovent, prednisone and magnesium.  Chest x-ray, CBC, CMP, troponin, BNP pending  9:22 PM Labs without acute findings. On reevaluation patient's abdominal pain is gone. He is eating and drinking and has no complaints. After breathing treatment here with magnesium and steroids patient is feeling much better. Will discharge patient home with prednisone and Levaquin. Recommended close follow-up with his PCP Dr. August Saucer.  Low suspicion for CHF today, dissection or PE. Low suspicion for acute abdominal  process given patient now feels much better and has no reproducible abdominal pain.  Final Clinical Impressions(s) / ED Diagnoses   Final diagnoses:  None    New Prescriptions New Prescriptions   No medications on file     Gwyneth Sprout, MD 05/19/16 2123

## 2016-05-19 NOTE — ED Triage Notes (Signed)
EMS called to Pt's PCP office because stff reported Pt was in resp. Distress. EMS gave 5mg  albuterol and then Pt arrived with a Duo neb treatment in progress. Pt A/O and speaking in full sentences. EMS reported Pt is dependent on 2 Lit. Nasal O2.

## 2016-06-23 ENCOUNTER — Other Ambulatory Visit: Payer: Self-pay | Admitting: Internal Medicine

## 2016-06-24 ENCOUNTER — Institutional Professional Consult (permissible substitution): Payer: Medicaid Other | Admitting: Emergency Medicine

## 2016-06-24 ENCOUNTER — Encounter: Payer: Self-pay | Admitting: Cardiology

## 2016-06-24 DIAGNOSIS — Z7901 Long term (current) use of anticoagulants: Secondary | ICD-10-CM | POA: Insufficient documentation

## 2016-06-24 DIAGNOSIS — Z87898 Personal history of other specified conditions: Secondary | ICD-10-CM | POA: Insufficient documentation

## 2016-06-24 NOTE — Progress Notes (Signed)
Elias Else  Date of visit:  03/18/2016 DOB:  12-21-1950    Age:  65 yrs. Medical record number:  [id number]     Account number:  [id number] Primary Care Provider: August Saucer, ERIC L ____________________________ CURRENT DIAGNOSES  1. COPD  2. Dyspnea  3. Long term (current) use of anticoagulants  4. Patient's other noncompliance with medication regimen  5. Cardiomyopathy, unspecified  6. Paroxysmal atrial fibrillation  7. CAD Native without angina ____________________________ ALLERGIES  No Known Drug Allergies ____________________________ MEDICATIONS  1. olanzapine 5 mg tablet, 1 p.o. daily  2. Pradaxa 150 mg capsule, BID  3. amiodarone 200 mg tablet, 1 p.o. daily  4. spironolactone 25 mg tablet, 1 p.o. daily  5. furosemide 40 mg tablet, 1 p.o. daily  6. metoprolol succinate ER 50 mg tablet,extended release 24 hr, 1 p.o. daily  7. losartan 25 mg tablet, 1 p.o. daily  8. Dulera 100 mcg-5 mcg/actuation HFA aerosol inhaler, 2 p.o. t.i.d.  9. albuterol sulfate HFA 90 mcg/actuation aerosol inhaler, PRN ____________________________ CHIEF COMPLAINTS  Followup of Cardiomyopathy, unspecified ____________________________ HISTORY OF PRESENT ILLNESS Patient returns for cardiac followup. He was hospitalized in December with exacerbation of COPD and severe respiratory failure. He was sent home on oxygen. He has been severely short of breath since then. He has cut his smoking down but is still severely hypoxic. He has a mild infiltrate in his lower lobe on x-ray when he was hospitalized. He has been on amiodarone for some time but has not been great about followup. He evidently does not seen by pulmonary when he was in the hospital. He currently denies chest pain and has no PND orthopnea or claudication. ____________________________ PAST HISTORY  Past Medical Illnesses:  seizures, hypertension, history of alcohol abuse;  Cardiovascular Illnesses:  atrial fibrillation, cardiomyopathy(dilated);  Surgical  Procedures:  fx collar bone;  NYHA Classification:  II;  Canadian Angina Classification:  Class 0: Asymptomatic;  Cardiology Procedures-Invasive:  cardiac cath (left) November 2006, cardioversion December 2012;  Cardiology Procedures-Noninvasive:  echocardiogram February 2015;  Cardiac Cath Results:  50% stenosis RCA, normal Left main, no significant disease LAD, no significant disease CFX;  LVEF of 40% documented via echocardiogram on 06/16/2011,  CHADS Score:  2,  CHA2DS2-VASC Score:  3 ____________________________ CARDIO-PULMONARY TEST DATES EKG Date:  03/18/2016;   Cardiac Cath Date:  12/16/2004;  Echocardiography Date: 04/06/2013;  Chest Xray Date: 06/27/2013;  CT Scan Date:  12/03/2011   ____________________________ FAMILY HISTORY Brother -- Brother alive and well Father -- Father dead, Chronic obstructive pulmonary disease, Coronary Artery Disease Mother -- Mother dead, CVA Sister -- Sister alive and well Sister -- Sister dead, Suicide ____________________________ SOCIAL HISTORY Alcohol Use:  history of alcohol abuse, currently not drinking;  Smoking:  greater than 50 pack year history, smokes cigarettes, less than 1 ppd;  Diet:  regular diet and no added salt;  Lifestyle:  single;  Exercise:  some exercise and walking;  Occupation:  Holiday representative;  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 Weight:  172.00 lbs. Height:  70"BMI:  24  Constitutional:  pleasant white male in no acute distress wearing oxygen Skin:  warm and dry to touch, no apparent skin lesions, or masses noted. Head:  Scar on front of head Chest:  normal symmetry, clear to auscultation Cardiac:  regular rhythm, normal S1 and S2, no S3 or S4 Peripheral Pulses:  the femoral,dorsalis pedis, and posterior tibial pulses are full and equal bilaterally with no bruits auscultated. Extremities & Back:  no deformities, clubbing, cyanosis, erythema or edema observed. Normal muscle strength and tone. Neurological:  no gross motor or sensory deficits noted, affect appropriate, oriented x3. ____________________________ MOST RECENT LIPID PANEL 09/14/12  CHOL TOTL 204 mg/dl, LDL 409 calc, HDL 53 mg/dl, TRIGLYCER 811 mg/dl, CHOL/HDL 3.8 (Calc) and VLDL 22 ____________________________ IMPRESSIONS/PLAN  1. Severe COPD currently requiring oxygen nocturnally not under pulmonary care 2. History of paroxysmal atrial fibrillation currently on amiodarone but with hypoxemia 3. Long-term use of anticoagulation with Pradaxa 4. History of CAD  Recommendations:  I would like him to have a set of pulmonary functions and to see a pulmonologist. He was wheezing today and I'm uncomfortable with him remaining on amiodarone at the present time in the setting of all this hypoxia. Asked him to stop taking it and we'll await the pulmonary consult. I will see him back in followup in 3 months. Continue other medications. ____________________________ TODAYS ORDERS  1. Pulmonary Consult: Schedule ASAP  2. Spirometry with pre/post bronchodilators: First Available  3. BNP: Today  4. TSH: Today  5. Return Visit: 3 months  6. 12 Lead EKG: Today                       ____________________________ Cardiology Physician:  Darden Palmer MD [facility]

## 2016-06-24 NOTE — Progress Notes (Signed)
Johnny Rodriguez  Date of visit:  06/24/2016 DOB:  03-Jan-1951    Age:  66 yrs. Medical record number:  [id number]     Account number:  [id number] Primary Care Provider: August Saucer, ERIC L ____________________________ CURRENT DIAGNOSES  1. COPD  2. Dyspnea  3. Long term (current) use of anticoagulants  4. Patient's other noncompliance with medication regimen  5. Cardiomyopathy, unspecified  6. Paroxysmal atrial fibrillation  7. CAD Native without angina ____________________________ ALLERGIES  No Known Drug Allergies ____________________________ MEDICATIONS  1. albuterol sulfate HFA 90 mcg/actuation aerosol inhaler, PRN  2. Dulera 100 mcg-5 mcg/actuation HFA aerosol inhaler, 2 p.o. t.i.d.  3. furosemide 40 mg tablet, 1 p.o. daily  4. losartan 25 mg tablet, 1 p.o. daily  5. metoprolol succinate ER 50 mg tablet,extended release 24 hr, 1 p.o. daily  6. Pradaxa 150 mg capsule, BID  7. spironolactone 25 mg tablet, 1 p.o. daily ____________________________ HISTORY OF PRESENT ILLNESS Patient returns for cardiac followup. Since he was previously here he did not keep his pulmonary appointment nor got a set of pulmonary function tests. I asked him to stop his amiodarone previously but he is still taking it. He is still wearing oxygen and is still dyspneic with exertion. He has very poor insight into his overall medical conditions. Is not currently having angina but is dyspneic with most any level of activity. He denies PND, orthopnea or edema. He still lives in a motel and has little social support at this time. Previous BNP level was low. ____________________________ PAST HISTORY  Past Medical Illnesses:  seizures, hypertension, history of alcohol abuse;  Cardiovascular Illnesses:  atrial fibrillation, cardiomyopathy(dilated);  Surgical Procedures:  fx collar bone;  NYHA Classification:  II;  Canadian Angina Classification:  Class 0: Asymptomatic;  Cardiology Procedures-Invasive:  cardiac cath (left)  November 2006, cardioversion December 2012;  Cardiology Procedures-Noninvasive:  echocardiogram February 2015;  Cardiac Cath Results:  50% stenosis RCA, normal Left main, no significant disease LAD, no significant disease CFX;  LVEF of 40% documented via echocardiogram on 06/16/2011,  CHADS Score:  2,  CHA2DS2-VASC Score:  3 ____________________________ CARDIO-PULMONARY TEST DATES EKG Date:  03/18/2016;   Cardiac Cath Date:  12/16/2004;  Echocardiography Date: 04/06/2013;  Chest Xray Date: 06/27/2013;  CT Scan Date:  12/03/2011   ____________________________ FAMILY HISTORY Brother -- Brother alive and well Father -- Father dead, Chronic obstructive pulmonary disease, Coronary Artery Disease Mother -- Mother dead, CVA Sister -- Sister alive and well Sister -- Sister dead, Suicide ____________________________ SOCIAL HISTORY Alcohol Use:  history of alcohol abuse, currently not drinking;  Smoking:  greater than 50 pack year history, smokes cigarettes, less than 1 ppd;  Diet:  regular diet and no added salt;  Lifestyle:  single;  Exercise:  exercise is limited due to physical disability;  Occupation:  disabled;  Residence:  lives alone;   ____________________________ REVIEW OF SYSTEMS General:  malaise and fatigue Respiratory: dyspnea with exertion Abdominal: denies dyspepsia, GI bleeding, constipation, or diarrhea Genitourinary-Male: no dysuria, urgency, frequency, or nocturia  Musculoskeletal:  arthritis of the left knee Neurological:  numbness in left hand Psychiatric:  history of panic disorder, situational stress  ____________________________ PHYSICAL EXAMINATION VITAL SIGNS  Blood Pressure:  130/80 Sitting, Right arm, regular cuff  , 130/78 Standing, Right arm and regular cuff   Pulse:  72/min. Weight:  173.00 lbs. Height:  70.00"BMI: 25  Constitutional:  pleasant white male in no acute distress wearing oxygen Skin:  warm and dry to touch, no apparent  skin lesions, or masses noted. Head:   Scar on front of head Chest:  normal symmetry, clear to auscultation Cardiac:  regular rhythm, normal S1 and S2, no S3 or S4 Peripheral Pulses:  the femoral,dorsalis pedis, and posterior tibial pulses are full and equal bilaterally with no bruits auscultated. Extremities & Back:  no deformities, clubbing, cyanosis, erythema or edema observed. Normal muscle strength and tone. Neurological:  no gross motor or sensory deficits noted, affect appropriate, oriented x3. ____________________________ MOST RECENT LIPID PANEL 09/14/12  CHOL TOTL 204 mg/dl, LDL 454 calc, HDL 53 mg/dl, TRIGLYCER 098 mg/dl, CHOL/HDL 3.8 (Calc) and VLDL 22 ____________________________ IMPRESSIONS/PLAN  1. Continued chronic dyspnea unclear whether there is any contribution of amiodarone to this. 2. Paroxysmal atrial fibrillation clinically in sinus rhythm today 3. Cardiomyopathy 4. COPD 5. Long-term use of anticoagulation 6. Medical noncompliance  Recommendations:  I took the bottle of amiodarone out of his bag and marked it and asked him not to take this. We scheduled a pulmonary pulmonary within in the office today and likely will need to have his pulmonary function test scheduled through them. Check sedimentation rate and chemistry panel today. Followup in 3 months. ____________________________ TODAYS ORDERS  1. Sed Rate: Today  2. Comprehensive Metabolic Panel: Today  3. Pulmonary Consult: Schedule ASAP  4. Return Visit: 3 months                       ____________________________ Cardiology Physician:  Darden Palmer MD [facility]

## 2016-06-25 ENCOUNTER — Encounter: Payer: Self-pay | Admitting: Emergency Medicine

## 2016-07-22 ENCOUNTER — Institutional Professional Consult (permissible substitution): Payer: Medicaid Other | Admitting: Pulmonary Disease

## 2016-09-09 ENCOUNTER — Encounter: Payer: Self-pay | Admitting: Internal Medicine

## 2016-09-09 ENCOUNTER — Ambulatory Visit (INDEPENDENT_AMBULATORY_CARE_PROVIDER_SITE_OTHER): Payer: Medicaid Other | Admitting: Internal Medicine

## 2016-09-09 VITALS — BP 110/80 | HR 70 | Ht 71.0 in | Wt 174.0 lb

## 2016-09-09 DIAGNOSIS — F17210 Nicotine dependence, cigarettes, uncomplicated: Secondary | ICD-10-CM | POA: Insufficient documentation

## 2016-09-09 DIAGNOSIS — J449 Chronic obstructive pulmonary disease, unspecified: Secondary | ICD-10-CM | POA: Diagnosis not present

## 2016-09-09 MED ORDER — MOMETASONE FURO-FORMOTEROL FUM 200-5 MCG/ACT IN AERO: 2.0000 | INHALATION_SPRAY | Freq: Two times a day (BID) | RESPIRATORY_TRACT | 11 refills | Status: DC

## 2016-09-09 MED ORDER — TIOTROPIUM BROMIDE MONOHYDRATE 2.5 MCG/ACT IN AERS: 2.0000 | INHALATION_SPRAY | Freq: Every day | RESPIRATORY_TRACT | 0 refills | Status: DC

## 2016-09-09 MED ORDER — MOMETASONE FURO-FORMOTEROL FUM 200-5 MCG/ACT IN AERO: 2.0000 | INHALATION_SPRAY | Freq: Two times a day (BID) | RESPIRATORY_TRACT | 0 refills | Status: DC

## 2016-09-09 MED ORDER — TIOTROPIUM BROMIDE MONOHYDRATE 2.5 MCG/ACT IN AERS: 2.0000 | INHALATION_SPRAY | Freq: Every day | RESPIRATORY_TRACT | 11 refills | Status: DC

## 2016-09-09 NOTE — Assessment & Plan Note (Addendum)
Spirometry 09/09/2016  Not physiologic  - 09/09/2016  After extensive coaching HFA effectiveness =    50% from a baseline of 10% > try dulera 200 2bid and spiriva respimat 2 each am    DDX of  difficult airways management almost all start with A and  include Adherence, Ace Inhibitors, Acid Reflux, Active Sinus Disease, Alpha 1 Antitripsin deficiency, Anxiety masquerading as Airways dz,  ABPA,  Allergy(esp in young), Aspiration (esp in elderly), Adverse effects of meds,  Active smokers, A bunch of PE's (a small clot burden can't cause this syndrome unless there is already severe underlying pulm or vascular dz with poor reserve) plus two Bs  = Bronchiectasis and Beta blocker use..and one C= CHF   Adherence is always the initial "prime suspect" and is a multilayered concern that requires a "trust but verify" approach in every patient - starting with knowing how to use medications, especially inhalers, correctly, keeping up with refills and understanding the fundamental difference between maintenance and prns vs those medications only taken for a very short course and then stopped and not refilled.  - see hfa teaching  - return with all meds in hand using a trust but verify approach to confirm accurate Medication  Reconciliation The principal here is that until we are certain that the  patients are doing what we've asked, it makes no sense to ask them to do more.    Active smoking (see separate a/p)   ? Anxiety/depression > usually at the bottom of this list of usual suspects but should be much higher on this pt's based on H and P and this may interfere with both adherence and inability to quit smoking   ? A bunch of PE's >  Note on pradaxa so n/a as long as taking it  ? BB effect > unlikely on toprol 50 but ideally (esp if higher doses needed)   prefer in this setting: Bystolic, the most beta -1  selective Beta blocker available in sample form, with bisoprolol the most selective generic choice  on the  market.   ? CHF >  Note last bnp  05/19/16 = 6 rules this out   Will bring back for a trust but verify ov then set up for f/u pfts   Total time devoted to counseling  > 50 % of initial 60 min office visit:  review case with pt/ discussion of options/alternatives/ personally creating written customized instructions  in presence of pt  then going over those specific  Instructions directly with the pt including how to use all of the meds but in particular covering each new medication in detail and the difference between the maintenance= "automatic" meds and the prns using an action plan format for the latter (If this problem/symptom => do that organization reading Left to right).  Please see AVS from this visit for a full list of these instructions which I personally wrote for this pt and  are unique to this visit.

## 2016-09-09 NOTE — Assessment & Plan Note (Signed)

## 2016-09-09 NOTE — Progress Notes (Signed)
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 Severity on dulera 200 2bid though didn't know how to use it or read the gauge (empty) on the back  Chief Complaint  Patient presents with  . Pulmonary Consult    Referred by Dr. Donnie Aho. Pt c/o SOB for the past year. He was started on o2 approx 1 yr ago.  He c/o SOB "all the time" with or without exertion.  He states also coughing up minimal green sputum. He has a proair inhaler that he uses at least 2 x per day.   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 No unusual exp hx or h/o childhood pna/ asthma or knowledge of premature birth.   Also denies any obvious fluctuation of symptoms with weather or environmental changes or other aggravating or alleviating factors except as outlined above   Current Medications, Allergies, Complete Past Medical History, Past Surgical History, Family History, and Social History were reviewed in [employer] record.  ROS  The following are not active complaints unless bolded sore throat, dysphagia, dental problems, itching, sneezing,  nasal congestion or excess/ purulent secretions, ear ache,   fever, chills, sweats, unintended wt loss, classically pleuritic or exertional cp,  orthopnea pnd or leg swelling, presyncope, palpitations, abdominal pain, anorexia, nausea,  vomiting, diarrhea  or change in bowel or bladder habits, change in stools or urine, dysuria,hematuria,  rash, arthralgias, visual complaints, headache, numbness, weakness or ataxia or problems with walking or coordination,  change in mood/affect since mother died  or memory.               Review of Systems     Objective:   Physical Exam    amb anxious wm nad at rest   Wt Readings from Last 3 Encounters:  09/09/16 174 lb (78.9 kg)  05/19/16 150 lb (68 kg)  02/15/16 150 lb (68 kg)    Vital signs reviewed - Note on arrival 02 sats  94% on   3lpm pulsed   HEENT: nl  turbinates bilaterally, and oropharynx. Nl external ear canals without cough reflex - edentulous    NECK :  without JVD/Nodes/TM/ nl carotid upstrokes bilaterally   LUNGS: no acc muscle use, Barrel contour chest with distant bs and end exp wheeze bilaterally  CV:  RRR  no s3 or murmur or increase in P2, and no edema   ABD:  soft and nontender with Pos hoover's early inspiraton  in the supine position. No bruits or organomegaly appreciated, bowel sounds nl  MS:  Nl gait/ ext warm without deformities, calf tenderness, cyanosis or clubbing No obvious joint restrictions   SKIN: warm and dry without lesions    NEURO:  alert, approp, nl sensorium with  no motor or cerebellar deficits apparent.       I personally reviewed images and agree with radiology impression as follows:  CXR:   05/19/16 Lungs mildly hyperinflated with changes related to COPD present. No focal infiltrates. No pulmonary edema or pleural effusion. No pneumothorax.    Assessment:

## 2016-09-09 NOTE — Patient Instructions (Addendum)
Plan A = Automatic =  Dulera 200 x 2 puffs followed by spiriva 2 pffs first thing in am and then repeat dulera 200 x 2 pffs 12 hours later  Work on  Improving  inhaler technique:  relax and gently blow all the way out then take a nice smooth deep breath back in, triggering the inhaler at same time you start breathing in.  Hold for up to 5 seconds if you can. Blow out thru nose. Rinse and gargle with water when done     Plan B = Backup Only use your albuterol as a rescue medication to be used if you can't catch your breath by resting or doing a relaxed purse lip breathing pattern.  - The less you use it, the better it will work when you need it. - Ok to use the inhaler up to 2 puffs  every 4 hours if you must but call for appointment if use goes up over your usual need - Don't leave home without it !!  (think of it like the spare tire for your car)        The key is to stop smoking completely before smoking completely stops you!   Please schedule a follow up office visit in 4 weeks, sooner if needed to see Tammy NP with all your medications in hand

## 2016-09-23 ENCOUNTER — Telehealth: Payer: Self-pay | Admitting: *Deleted

## 2016-09-23 NOTE — Telephone Encounter (Signed)
Pt is seeing TP on 8/29 in the AM- and you are only here in the PM.  Also you are 100% booked that day.  One opening on your schedule available this Thursday (8/16) but following next available opening is 10/24/16.    Ok to double book?  Thanks.

## 2016-09-23 NOTE — Telephone Encounter (Signed)
-----   Message from Nyoka Cowden, MD sent at 09/23/2016  4:47 PM EDT ----- Regarding: RE: ?amiodarone Will do / thx  Triage: change appt for this pt to see me instead of NP same date if works for him   ----- Message ----- From: Othella Boyer, MD Sent: 09/23/2016   4:26 PM To: Nyoka Cowden, MD Subject: ?amiodarone                                    Kathlene November,  When you see Mr. Klauser back, please comment on whether you think there is any evidence of amiodarone effect here and whether we could use it again if he is in a fib again.  Thanks,  [employer]

## 2016-09-23 NOTE — Telephone Encounter (Signed)
Let's see if he can come 8/16 then and if not then ok to double book

## 2016-09-24 NOTE — Telephone Encounter (Signed)
Pt cannot do 09/25/16 Pt rides [employer] and has to give at least 3 days notice for appts to set up transportation.  Pt has upcoming appts already schedule for 09/26/16  Pt unable to do an appt this month d/t financial reasons. Pt is scheduled for 10/15/16 with Dr Sherene Sires.  Appt reminder mailed to patient per patient request. Nothing further needed.

## 2016-10-08 ENCOUNTER — Encounter: Payer: Medicaid Other | Admitting: Adult Health

## 2016-10-15 ENCOUNTER — Ambulatory Visit (INDEPENDENT_AMBULATORY_CARE_PROVIDER_SITE_OTHER): Payer: Medicaid Other | Admitting: Internal Medicine

## 2016-10-15 ENCOUNTER — Encounter: Payer: Self-pay | Admitting: Internal Medicine

## 2016-10-15 VITALS — BP 132/78 | HR 112 | Ht 71.0 in | Wt 173.8 lb

## 2016-10-15 DIAGNOSIS — F17210 Nicotine dependence, cigarettes, uncomplicated: Secondary | ICD-10-CM | POA: Diagnosis not present

## 2016-10-15 DIAGNOSIS — J9611 Chronic respiratory failure with hypoxia: Secondary | ICD-10-CM | POA: Diagnosis not present

## 2016-10-15 DIAGNOSIS — J449 Chronic obstructive pulmonary disease, unspecified: Secondary | ICD-10-CM | POA: Diagnosis not present

## 2016-10-15 MED ORDER — MOMETASONE FURO-FORMOTEROL FUM 200-5 MCG/ACT IN AERO: 2.0000 | INHALATION_SPRAY | Freq: Two times a day (BID) | RESPIRATORY_TRACT | 0 refills | Status: DC

## 2016-10-15 MED ORDER — TIOTROPIUM BROMIDE MONOHYDRATE 2.5 MCG/ACT IN AERS: 2.0000 | INHALATION_SPRAY | Freq: Every day | RESPIRATORY_TRACT | 0 refills | Status: DC

## 2016-10-15 NOTE — Patient Instructions (Addendum)
Plan A = Automatic =  Dulera 200 2 puffs every 12 hours and spiriva 2 pffs each am   Work on inhaler technique:  relax and gently blow all the way out then take a nice smooth deep breath back in, triggering the inhaler at same time you start breathing in.  Hold for up to 5 seconds if you can. Blow out thru nose. Rinse and gargle with water when done   Plan B = Backup Only use your albuterol as a rescue medication to be used if you can't catch your breath by resting or doing a relaxed purse lip breathing pattern.  - The less you use it, the better it will work when you need it. - Ok to use the inhaler up to 2 puffs  every 4 hours if you must but call for appointment if use goes up over your usual need - Don't leave home without it !!  (think of it like the spare tire for your car)   Continue 02 3lpm and check oxygen level to be sure it stays above 90% walking   The key is to stop smoking completely before smoking completely stops you!    Please schedule a follow up office visit in 6 weeks with pfts on return , call sooner if needed with all medications /inhalers/ solutions in hand so we can verify exactly what you are taking. This includes all medications from all doctors and over the counters

## 2016-10-15 NOTE — Assessment & Plan Note (Signed)
Adequate control on present rx, reviewed in detail with pt > no change in rx needed  = 3lpm 24/7 / cautioned re 02 and smoking

## 2016-10-15 NOTE — Assessment & Plan Note (Signed)
>   3 min   Matter of life or breath reviewed plus dangers of smoking and using 02, not willing to commit to quit at this point
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
## 2016-10-15 NOTE — Assessment & Plan Note (Addendum)
Spirometry 09/09/2016  Not physiologic  - 09/09/2016    try dulera 200 2bid and spiriva respimat 2 each am > did not keep up with refills - 10/15/2016  After extensive coaching HFA effectiveness =    90% with SMI/ 75% with hfa > continue dulera 200 2bid and spiriva respimat daily   DDX of  difficult airways management almost all start with A and  include Adherence, Ace Inhibitors, Acid Reflux, Active Sinus Disease, Alpha 1 Antitripsin deficiency, Anxiety masquerading as Airways dz,  ABPA,  Allergy(esp in young), Aspiration (esp in elderly), Adverse effects of meds,  Active smokers, A bunch of PE's (a small clot burden can't cause this syndrome unless there is already severe underlying pulm or vascular dz with poor reserve) plus two Bs  = Bronchiectasis and Beta blocker use..and one C= CHF   Adherence is always the initial "prime suspect" and is a multilayered concern that requires a "trust but verify" approach in every patient - starting with knowing how to use medications, especially inhalers, correctly, keeping up with refills and understanding the fundamental difference between maintenance and prns vs those medications only taken for a very short course and then stopped and not refilled.  - needs to return with all meds in hand using a trust but verify approach to confirm accurate Medication  Reconciliation The principal here is that until we are certain that the  patients are doing what we've asked, it makes no sense to ask them to do more.  - see hfa teaching  Active smoking (see separate a/p)   ? Anxiety/depression > usually at the bottom of this list of usual suspects but should be much higher on this pt's based on H and P  And may be playing a role in difficulty keeping up with meds/ instructions (won't wear his reading glasses for simple tasks like reading the counter on his inhaler)  ? BB effect > probably not an issue with relatively low dose toprol though ideally Strongly prefer in this  setting: Bystolic, the most beta -1  selective Beta blocker available in sample form, with bisoprolol the most selective generic choice  on the market.   ? chf > see last bnp 05/19/16 wnl but need to keep in ddx for flares    I had an extended discussion with the patient reviewing all relevant studies completed to date and  lasting 25 minutes of a 40  minute office visit addressing  re  severe non-specific but potentially very serious refractory respiratory symptoms of uncertain and potentially multiple  Etiologies and trying to make sure he understood the limits of effective care even in a specialty clinic  if we can't get on the same page with his meds   Formulary restrictions will be an ongoing challenge for the forseable future and I would be happy to pick an alternative if the pt will first  provide me a list of them but pt  will need to return here for training for any new device that is required eg dpi vs hfa vs respimat.    In meantime we can always provide samples so the patient never runs out of any needed respiratory medications.   Each maintenance medication was reviewed in detail including most importantly the difference between maintenance and prns and under what circumstances the prns are to be triggered using an action plan format that is not reflected in the computer generated alphabetically organized AVS.    Please see AVS for specific instructions unique  to this office visit that I personally wrote and verbalized to the the pt in detail and then reviewed with pt  by my nurse highlighting any changes in therapy/plan of care  recommended at today's visit.

## 2016-10-23 ENCOUNTER — Ambulatory Visit: Payer: Medicaid Other | Admitting: Internal Medicine

## 2016-11-21 ENCOUNTER — Encounter: Payer: Self-pay | Admitting: Internal Medicine

## 2016-11-21 ENCOUNTER — Ambulatory Visit (INDEPENDENT_AMBULATORY_CARE_PROVIDER_SITE_OTHER): Payer: Medicaid Other | Admitting: Internal Medicine

## 2016-11-21 VITALS — BP 140/100 | HR 103 | Ht 71.0 in | Wt 170.0 lb

## 2016-11-21 DIAGNOSIS — R55 Syncope and collapse: Secondary | ICD-10-CM
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
## 2016-11-21 NOTE — Patient Instructions (Signed)

## 2016-11-26 ENCOUNTER — Ambulatory Visit: Payer: Medicaid Other | Admitting: Internal Medicine

## 2017-01-23 ENCOUNTER — Inpatient Hospital Stay (HOSPITAL_COMMUNITY)
Admission: EM | Admit: 2017-01-23 | Discharge: 2017-01-27 | DRG: 193 | Disposition: A | Discharge status: Home Health | Payer: Medicaid Other | Attending: Internal Medicine | Admitting: Internal Medicine

## 2017-01-23 ENCOUNTER — Encounter (HOSPITAL_COMMUNITY): Payer: Self-pay | Admitting: Internal Medicine

## 2017-01-23 ENCOUNTER — Emergency Department (HOSPITAL_COMMUNITY): Payer: Medicaid Other

## 2017-01-23 ENCOUNTER — Other Ambulatory Visit: Payer: Self-pay

## 2017-01-23 DIAGNOSIS — Z79899 Other long term (current) drug therapy: Secondary | ICD-10-CM

## 2017-01-23 DIAGNOSIS — Z9981 Dependence on supplemental oxygen: Secondary | ICD-10-CM

## 2017-01-23 DIAGNOSIS — R0601 Orthopnea: Secondary | ICD-10-CM

## 2017-01-23 DIAGNOSIS — I4891 Unspecified atrial fibrillation: Secondary | ICD-10-CM

## 2017-01-23 DIAGNOSIS — G40909 Epilepsy, unspecified, not intractable, without status epilepticus: Secondary | ICD-10-CM | POA: Diagnosis present

## 2017-01-23 DIAGNOSIS — Z823 Family history of stroke: Secondary | ICD-10-CM | POA: Diagnosis not present

## 2017-01-23 DIAGNOSIS — J9611 Chronic respiratory failure with hypoxia: Secondary | ICD-10-CM | POA: Diagnosis present

## 2017-01-23 DIAGNOSIS — I480 Paroxysmal atrial fibrillation: Secondary | ICD-10-CM | POA: Diagnosis present

## 2017-01-23 DIAGNOSIS — G8929 Other chronic pain: Secondary | ICD-10-CM | POA: Diagnosis present

## 2017-01-23 DIAGNOSIS — J9622 Acute and chronic respiratory failure with hypercapnia: Secondary | ICD-10-CM | POA: Diagnosis present

## 2017-01-23 DIAGNOSIS — R0902 Hypoxemia: Secondary | ICD-10-CM | POA: Diagnosis not present

## 2017-01-23 DIAGNOSIS — I2510 Atherosclerotic heart disease of native coronary artery without angina pectoris: Secondary | ICD-10-CM | POA: Diagnosis present

## 2017-01-23 DIAGNOSIS — J440 Chronic obstructive pulmonary disease with acute lower respiratory infection: Secondary | ICD-10-CM | POA: Diagnosis present

## 2017-01-23 DIAGNOSIS — Z7951 Long term (current) use of inhaled steroids: Secondary | ICD-10-CM

## 2017-01-23 DIAGNOSIS — I110 Hypertensive heart disease with heart failure: Secondary | ICD-10-CM | POA: Diagnosis present

## 2017-01-23 DIAGNOSIS — I5022 Chronic systolic (congestive) heart failure: Secondary | ICD-10-CM | POA: Diagnosis present

## 2017-01-23 DIAGNOSIS — J189 Pneumonia, unspecified organism: Secondary | ICD-10-CM | POA: Diagnosis present

## 2017-01-23 DIAGNOSIS — R32 Unspecified urinary incontinence: Secondary | ICD-10-CM | POA: Diagnosis present

## 2017-01-23 DIAGNOSIS — F17210 Nicotine dependence, cigarettes, uncomplicated: Secondary | ICD-10-CM | POA: Diagnosis present

## 2017-01-23 DIAGNOSIS — J449 Chronic obstructive pulmonary disease, unspecified: Secondary | ICD-10-CM | POA: Diagnosis not present

## 2017-01-23 DIAGNOSIS — Z8249 Family history of ischemic heart disease and other diseases of the circulatory system: Secondary | ICD-10-CM

## 2017-01-23 DIAGNOSIS — I10 Essential (primary) hypertension: Secondary | ICD-10-CM | POA: Diagnosis not present

## 2017-01-23 DIAGNOSIS — I493 Ventricular premature depolarization: Secondary | ICD-10-CM | POA: Diagnosis present

## 2017-01-23 DIAGNOSIS — I509 Heart failure, unspecified: Secondary | ICD-10-CM | POA: Diagnosis not present

## 2017-01-23 DIAGNOSIS — J9621 Acute and chronic respiratory failure with hypoxia: Secondary | ICD-10-CM | POA: Diagnosis present

## 2017-01-23 DIAGNOSIS — J441 Chronic obstructive pulmonary disease with (acute) exacerbation: Secondary | ICD-10-CM | POA: Diagnosis present

## 2017-01-23 DIAGNOSIS — I5042 Chronic combined systolic (congestive) and diastolic (congestive) heart failure: Secondary | ICD-10-CM | POA: Diagnosis present

## 2017-01-23 DIAGNOSIS — R0602 Shortness of breath: Secondary | ICD-10-CM | POA: Diagnosis present

## 2017-01-23 DIAGNOSIS — I428 Other cardiomyopathies: Secondary | ICD-10-CM | POA: Diagnosis present

## 2017-01-23 DIAGNOSIS — Z7982 Long term (current) use of aspirin: Secondary | ICD-10-CM | POA: Diagnosis not present

## 2017-01-23 DIAGNOSIS — Z825 Family history of asthma and other chronic lower respiratory diseases: Secondary | ICD-10-CM

## 2017-01-23 DIAGNOSIS — Z836 Family history of other diseases of the respiratory system: Secondary | ICD-10-CM

## 2017-01-23 LAB — COMPREHENSIVE METABOLIC PANEL
ALT: 26 U/L (ref 17–63)
AST: 41 U/L (ref 15–41)
Albumin: 3.2 g/dL — ABNORMAL LOW (ref 3.5–5.0)
Alkaline Phosphatase: 70 U/L (ref 38–126)
Anion gap: 14 (ref 5–15)
BUN: 16 mg/dL (ref 6–20)
CO2: 33 mmol/L — ABNORMAL HIGH (ref 22–32)
Calcium: 9.4 mg/dL (ref 8.9–10.3)
Chloride: 89 mmol/L — ABNORMAL LOW (ref 101–111)
Creatinine, Ser: 1.02 mg/dL (ref 0.61–1.24)
GFR calc Af Amer: >60 mL/min (ref >60)
GFR calc non Af Amer: >60 mL/min (ref >60)
Glucose, Bld: 152 mg/dL — ABNORMAL HIGH (ref 65–99)
Potassium: 3.8 mmol/L (ref 3.5–5.1)
Sodium: 136 mmol/L (ref 135–145)
Total Bilirubin: 1 mg/dL (ref 0.3–1.2)
Total Protein: 7.9 g/dL (ref 6.5–8.1)

## 2017-01-23 LAB — CBC WITH DIFFERENTIAL/PLATELET
Basophils Absolute: 0.1 10*3/uL (ref 0.0–0.1)
Basophils Relative: 1 %
Eosinophils Absolute: 0 10*3/uL (ref 0.0–0.7)
Eosinophils Relative: 0 %
HCT: 47.8 % (ref 39.0–52.0)
Hemoglobin: 15.9 g/dL (ref 13.0–17.0)
Lymphocytes Relative: 9 %
Lymphs Abs: 1 10*3/uL (ref 0.7–4.0)
MCH: 31.1 pg (ref 26.0–34.0)
MCHC: 33.3 g/dL (ref 30.0–36.0)
MCV: 93.5 fL (ref 78.0–100.0)
Monocytes Absolute: 0.8 10*3/uL (ref 0.1–1.0)
Monocytes Relative: 7 %
Neutro Abs: 9 10*3/uL — ABNORMAL HIGH (ref 1.7–7.7)
Neutrophils Relative %: 83 %
Platelets: 312 10*3/uL (ref 150–400)
RBC: 5.11 MIL/uL (ref 4.22–5.81)
RDW: 13.1 % (ref 11.5–15.5)
WBC: 10.9 10*3/uL — ABNORMAL HIGH (ref 4.0–10.5)

## 2017-01-23 LAB — I-STAT TROPONIN, ED
Troponin i, poc: 0.01 ng/mL (ref 0.00–0.08)
Troponin i, poc: 0 ng/mL (ref 0.00–0.08)

## 2017-01-23 LAB — LACTIC ACID, PLASMA
Lactic Acid, Venous: 3.8 mmol/L (ref 0.5–1.9)
Lactic Acid, Venous: 2.9 mmol/L (ref 0.5–1.9)

## 2017-01-23 LAB — I-STAT CG4 LACTIC ACID, ED
Lactic Acid, Venous: 2.15 mmol/L (ref 0.5–1.9)
Lactic Acid, Venous: 2.23 mmol/L (ref 0.5–1.9)

## 2017-01-23 LAB — PROTIME-INR
INR: 1.06
Prothrombin Time: 13.7 seconds (ref 11.4–15.2)

## 2017-01-23 LAB — TROPONIN I: Troponin I: <0.03 ng/mL (ref <0.03)

## 2017-01-23 LAB — BRAIN NATRIURETIC PEPTIDE: B Natriuretic Peptide: 127.7 pg/mL — ABNORMAL HIGH (ref 0.0–100.0)

## 2017-01-23 LAB — LIPASE, BLOOD: Lipase: 32 U/L (ref 11–51)

## 2017-01-23 MED ORDER — ACETAMINOPHEN 650 MG RE SUPP
650.0000 mg | Freq: Four times a day (QID) | RECTAL | Status: DC | PRN
Start: 2017-01-23 — End: 2017-01-27

## 2017-01-23 MED ORDER — SODIUM CHLORIDE 0.9 % IV BOLUS (SEPSIS): 1000.0000 mL | Freq: Once | INTRAVENOUS | Status: DC

## 2017-01-23 MED ORDER — ACETAMINOPHEN 325 MG PO TABS: 650.0000 mg | ORAL_TABLET | Freq: Four times a day (QID) | ORAL | Status: DC | PRN

## 2017-01-23 MED ORDER — ASPIRIN EC 81 MG PO TBEC
81.0000 mg | DELAYED_RELEASE_TABLET | Freq: Every day | ORAL | Status: DC
  Administered 2017-01-24 – 2017-01-27 (×5): 81 mg via ORAL
  Filled 2017-01-23 (×5): qty 1

## 2017-01-23 MED ORDER — DILTIAZEM HCL 100 MG IV SOLR
5.0000 mg/h | INTRAVENOUS | Status: DC
  Administered 2017-01-23: 5 mg/h via INTRAVENOUS
  Administered 2017-01-24 (×2): 7.5 mg/h via INTRAVENOUS
  Administered 2017-01-25: 5 mg/h via INTRAVENOUS
  Filled 2017-01-23 (×4): qty 100

## 2017-01-23 MED ORDER — DILTIAZEM LOAD VIA INFUSION
10.0000 mg | Freq: Once | INTRAVENOUS | Status: AC
  Administered 2017-01-23: 10 mg via INTRAVENOUS
  Filled 2017-01-23: qty 10

## 2017-01-23 MED ORDER — ALBUTEROL (5 MG/ML) CONTINUOUS INHALATION SOLN
10.0000 mg/h | INHALATION_SOLUTION | Freq: Once | RESPIRATORY_TRACT | Status: AC
  Administered 2017-01-23: 10 mg/h via RESPIRATORY_TRACT
  Filled 2017-01-23: qty 20

## 2017-01-23 MED ORDER — SODIUM CHLORIDE 0.9 % IV BOLUS (SEPSIS)
1000.0000 mL | Freq: Once | INTRAVENOUS | Status: AC
  Administered 2017-01-23: 1000 mL via INTRAVENOUS

## 2017-01-23 MED ORDER — DABIGATRAN ETEXILATE MESYLATE 150 MG PO CAPS
150.0000 mg | ORAL_CAPSULE | Freq: Two times a day (BID) | ORAL | Status: DC
  Administered 2017-01-23 – 2017-01-27 (×8): 150 mg via ORAL
  Filled 2017-01-23 (×10): qty 1

## 2017-01-23 MED ORDER — DEXTROSE 5 % IV SOLN
250.0000 mg | INTRAVENOUS | Status: AC
  Administered 2017-01-24 – 2017-01-25 (×2): 250 mg via INTRAVENOUS
  Filled 2017-01-23 (×2): qty 250

## 2017-01-23 MED ORDER — MAGNESIUM SULFATE 2 GM/50ML IV SOLN
2.0000 g | Freq: Once | INTRAVENOUS | Status: AC
Start: 2017-01-23 — End: 2017-01-23
  Administered 2017-01-23: 2 g via INTRAVENOUS
  Filled 2017-01-23: qty 50

## 2017-01-23 MED ORDER — IPRATROPIUM-ALBUTEROL 0.5-2.5 (3) MG/3ML IN SOLN
3.0000 mL | Freq: Four times a day (QID) | RESPIRATORY_TRACT | Status: DC
Start: 2017-01-23 — End: 2017-01-24
  Administered 2017-01-23 – 2017-01-24 (×3): 3 mL via RESPIRATORY_TRACT
  Filled 2017-01-23 (×3): qty 3

## 2017-01-23 MED ORDER — AZITHROMYCIN 500 MG IV SOLR
500.0000 mg | Freq: Once | INTRAVENOUS | Status: AC
  Administered 2017-01-23: 500 mg via INTRAVENOUS
  Filled 2017-01-23: qty 500

## 2017-01-23 MED ORDER — TIOTROPIUM BROMIDE MONOHYDRATE 18 MCG IN CAPS
1.0000 | ORAL_CAPSULE | Freq: Every day | RESPIRATORY_TRACT | Status: DC
  Administered 2017-01-24 – 2017-01-27 (×4): 18 ug via RESPIRATORY_TRACT
  Filled 2017-01-23: qty 5

## 2017-01-23 MED ORDER — DEXTROSE 5 % IV SOLN
1.0000 g | Freq: Once | INTRAVENOUS | Status: AC
  Administered 2017-01-23: 1 g via INTRAVENOUS
  Filled 2017-01-23: qty 10

## 2017-01-23 MED ORDER — MOMETASONE FURO-FORMOTEROL FUM 200-5 MCG/ACT IN AERO
2.0000 | INHALATION_SPRAY | Freq: Two times a day (BID) | RESPIRATORY_TRACT | Status: DC
  Administered 2017-01-23 – 2017-01-27 (×7): 2 via RESPIRATORY_TRACT
  Filled 2017-01-23: qty 8.8

## 2017-01-23 MED ORDER — CEFTRIAXONE SODIUM 1 G IJ SOLR
1.0000 g | INTRAMUSCULAR | Status: DC
  Administered 2017-01-24 – 2017-01-27 (×4): 1 g via INTRAVENOUS
  Filled 2017-01-23 (×4): qty 10

## 2017-01-23 MED ORDER — ENOXAPARIN SODIUM 40 MG/0.4ML ~~LOC~~ SOLN: 40.0000 mg | SUBCUTANEOUS | Status: DC

## 2017-01-23 NOTE — Progress Notes (Signed)
CRITICAL VALUE ALERT  Critical Value:  Lactic Acid 3.8  Date & Time Notied:  01/23/17 @ 18:41  Provider Notified: Dr Rogelia Boga  Orders Received/Actions taken: pending call back

## 2017-01-23 NOTE — H&P (Signed)
Date: 01/23/2017               Patient Name:  Johnny Rodriguez MRN: [id number]  DOB: March 20, 1950 Age / Sex: 66 y.o., male   PCP: Gwenyth Bender, MD         Medical Service: Internal Medicine Teaching Service         Attending Physician: Dr. Burns Spain, MD    First Contact: Dr. Lorenso Courier Pager: [phone]  Second Contact: Dr. Nyra Market Pager: [phone]       After Hours (After 5p/  First Contact Pager: [phone]  weekends / holidays): Second Contact Pager: [phone]   Chief Complaint: Shortness of breath  History of Present Illness: Johnny Rodriguez is a 66 year old male with past medical history of atrial fibrillation, coronary artery disease, CHF, COPD on home oxygen, seizure disorder who presented with shortness of breath for the past 2 days. The patient states that he has been short of breath for the past year, but for the past 2 days the breathing has been getting worse. He states that he cannot even walk to the bathroom without getting winded. He is also orthopneic and uses 4 pillows nightly. The patient states that he occasionally misses doses of lasix when he goes out somewhere as he is embarrassed bout becoming incontinent of urine.   The patient had chest pain last night that lasted 1 minute, was 5/10 intensity, sharp in nature, present over the anterior chest, was nonradiating. He states that he feels like he had some diaphoresis and shortness of breath. He has not had any prior episodes of similar chest pain.   He has been having productive cough and states that he has been bringing up thick mucus that he has to pull out manually. He also reports subjective fever and chills. He has not had any vomiting and not been exposed to any sick contacts.   The patient called EMS stating that his home oxygen reading was at 65%.  EMS gave the patient continuous albuterol treatment, Atrovent, Solu-Medrol  ED course: The patient was tachycardic in 160s, tachypnic, afebrile,  normotensive (116/179) I stat troponin=0.01 BNP=127 -Given magnesium and continuous nebulizer treatments -Azithromycin and ceftriaxone dose given   Meds:  Current Meds  Medication Sig  . Ergocalciferol (VITAMIN D2) 2000 units TABS Take 1 tablet by mouth daily.  . furosemide (LASIX) 40 MG tablet Take 40 mg by mouth daily.    Marland Kitchen losartan (COZAAR) 25 MG tablet Take 25 mg by mouth daily.  . metoprolol (TOPROL-XL) 50 MG 24 hr tablet Take 50 mg by mouth daily.   . OXYGEN 3lpm 24/7  AHC  . PROAIR HFA 108 (90 Base) MCG/ACT inhaler Inhale 1 puff into the lungs every 6 (six) hours as needed for wheezing.   Marland Kitchen spironolactone (ALDACTONE) 25 MG tablet Take 25 mg by mouth daily.  . Tiotropium Bromide Monohydrate (SPIRIVA RESPIMAT) 2.5 MCG/ACT AERS Inhale 2 puffs into the lungs daily.     Allergies: Allergies as of 01/23/2017  . (No Known Allergies)   Past Medical History:  Diagnosis Date  . A-fib (HCC)   . AF (atrial fibrillation) (HCC)   . Alcohol abuse   . Angina pectoris   . Arrhythmia   . CAD (coronary artery disease) 12/15/2004  . Cardiomyopathy, idiopathic (HCC)   . CHF (congestive heart failure) (HCC)   . COPD (chronic obstructive pulmonary disease) (HCC)   . Coronary artery disease   . Heavy cigarette smoker   .  HTN (hypertension)   . Idiopathic cardiomyopathy (HCC) 07/16/2009  . Non compliance with medical treatment   . Seizures (HCC)     Family History:  Family History  Problem Relation Age of Onset  . Suicidality Sister   . CVA Mother   . Stroke Mother   . Heart disease Father   . Coronary artery disease Father   . COPD Father      Social History:   Social History   Socioeconomic History  . Marital status: Single    Spouse name: None  . Number of children: None  . Years of education: None  . Highest education level: None  Social Needs  . Financial resource strain: None  . Food insecurity - worry: None  . Food insecurity - inability: None  . Transportation  needs - medical: None  . Transportation needs - non-medical: None  Occupational History  . Occupation: Production manager: UNEMPLOYED  Tobacco Use  . Smoking status: Current Every Day Smoker    Packs/day: 2.00    Years: 58.00    Pack years: 116.00  . Smokeless tobacco: Never Used  . Tobacco comment: pt states he is down to 1ppd  Substance and Sexual Activity  . Alcohol use: Yes    Alcohol/week: 0.0 oz    Comment: EtOH use diorder - liquor  . Drug use: No  . Sexual activity: None  Other Topics Concern  . None  Social History Narrative   ** Merged History Encounter **         Review of Systems: A complete ROS was negative except as per HPI.   Physical Exam: Blood pressure 115/86, pulse 88, temperature 97.7 F (36.5 C), temperature source Oral, resp. rate (!) 25, height 5\' 11"  (1.803 m), weight 150 lb (68 kg), SpO2 95 %.  Physical Exam  Constitutional: He appears well-developed and well-nourished.  Non-toxic appearance. He does not appear ill.  HENT:  Head: Normocephalic and atraumatic.  Eyes: EOM are normal. Pupils are equal, round, and reactive to light.  Cardiovascular: Normal rate and intact distal pulses.  Irregularly irregular rhythm  Pulmonary/Chest: No accessory muscle usage. He is in respiratory distress. He has rales. He exhibits no tenderness.  Abdominal: Soft. Bowel sounds are normal. He exhibits no distension. There is no tenderness.  Neurological: He is alert.  Psychiatric: He has a normal mood and affect. His behavior is normal. His mood appears not anxious. He is not agitated.    EKG: personally reviewed my interpretation is atrial fibrillation  CXR: personally reviewed my interpretation is no tracheal deviation, left middle lobe pneumonia    Assessment & Plan by Problem:  Pneumonia The patient presented with shortness of breath. He was saturating at 90-98%. His lactic acid level on admission was 2.23, wbc=10.9. The patient meets SIRS  criteria based on respiratory rate and heart rate. The patient's likely source of infection and cause of his shortness of breath is pneumonia. His chest x-ray shows left middle lobe infiltrate. Per CURB65 criteria the patient scores 1 point and is low risk with a 2.7% 30 day mortality.   -trend troponin -started ceftriaxone and azithromycin  -continuous monitoring  Coronary artery disease -continue metoprolol 50mg  qd -Continue losartan 25mg  daily   COPD  -continue spiriva, dulera, and pro-air   CHF -Continue lasix 40mg  qd   Essential Hypertension The patient's blood pressure since admission has ranged 98-140/67-101.  -continue metoprolol 50mg  qd -Continue losartan 25mg  daily  Dispo: Admit patient to  Inpatient with expected length of stay greater than 2 midnights.  Signed: Lorenso CourierVahini Chundi, MD Internal Medicine PGY1 Pager:[phone] 01/23/2017, 5:11 PM

## 2017-01-23 NOTE — Progress Notes (Addendum)
Spoke to covering provider regarding 1liter fluid bolus ordered. Informed provider of faint crackles bilaterally, tachypnea/SOB upon assessment. Also notified pt received 1 liter Cardizem loading dose prior to shift change. Pending repeat lactic level at this time. Verbal to hold on administering fluid bolus pending lactic result and call back from MD. Will continue to monitor.

## 2017-01-23 NOTE — ED Triage Notes (Signed)
Pt states sob x 1 week however this am was unable to ambulate. Pt on home O2 and pt states was at 65% at home.

## 2017-01-23 NOTE — ED Notes (Signed)
Report attempted 

## 2017-01-23 NOTE — Progress Notes (Signed)
RN called RT to pass on that MD wanted pt on bipap in addition to CAT ordered. Pt started on bipap 10/5 R 10, 40%. Pt with increased RR, Increased WOB, but states its better with bipap. RT will continue to monitor.

## 2017-01-23 NOTE — H&P (Signed)
Date: 01/23/2017               Patient Name:  Johnny Rodriguez MRN: [id number]  DOB: 04/07/1950 Age / Sex: 66 y.o., male   PCP: Gwenyth Bender, MD         Medical Service: Internal Medicine Teaching Service         Attending Physician: Dr. Burns Spain, MD    First Contact: Dr. Lorenso Courier Pager: [phone]  Second Contact: Dr. Nyra Market Pager: [phone]       After Hours (After 5p/  First Contact Pager: [phone]  weekends / holidays): Second Contact Pager: [phone]   Chief Complaint: Shortness of breath  History of Present Illness: Johnny Rodriguez is a 66 year old male with past medical history of atrial fibrillation, coronary artery disease, CHF, COPD on home oxygen, seizure disorder who presented with shortness of breath for the past 2 days. The patient states that he has been short of breath for the past year, but for the past 2 days the breathing has been getting worse. He states that he cannot even walk to the bathroom without getting winded. He is also orthopneic and uses 4 pillows nightly. The patient states that he occasionally misses doses of lasix when he goes out somewhere as he is embarrassed bout becoming incontinent of urine.   The patient had chest pain last night that lasted 1 minute, was 5/10 intensity, sharp in nature, present over the anterior chest, was nonradiating. He states that he feels like he had some diaphoresis and shortness of breath. He has not had any prior episodes of similar chest pain.   He has been having productive cough and states that he has been bringing up thick mucus that he has to pull out manually. He also reports subjective fever and chills. He has not had any vomiting and not been exposed to any sick contacts.   The patient called EMS stating that his home oxygen reading was at 65%.  EMS gave the patient continuous albuterol treatment, Atrovent, Solu-Medrol  ED course: The patient was tachycardic in  160s, tachypnic, afebrile, normotensive (116/179) I stat troponin=0.01 BNP=127 -Given magnesium and continuous nebulizer treatments -Azithromycin and ceftriaxone dose given   Meds:  ActiveMedications      Current Meds  Medication Sig  . Ergocalciferol (VITAMIN D2) 2000 units TABS Take 1 tablet by mouth daily.  . furosemide (LASIX) 40 MG tablet Take 40 mg by mouth daily.    Marland Kitchen losartan (COZAAR) 25 MG tablet Take 25 mg by mouth daily.  . metoprolol (TOPROL-XL) 50 MG 24 hr tablet Take 50 mg by mouth daily.   . OXYGEN 3lpm 24/7  [facility]  . PROAIR HFA 108 (90 Base) MCG/ACT inhaler Inhale 1 puff into the lungs every 6 (six) hours as needed for wheezing.   Marland Kitchen spironolactone (ALDACTONE) 25 MG tablet Take 25 mg by mouth daily.  . Tiotropium Bromide Monohydrate (SPIRIVA RESPIMAT) 2.5 MCG/ACT AERS Inhale 2 puffs into the lungs daily.       Allergies:    Allergies as of 01/23/2017  . (No Known Allergies)       Past Medical History:  Diagnosis Date  . A-fib (HCC)   . AF (atrial fibrillation) (HCC)   . Alcohol abuse   . Angina pectoris   . Arrhythmia   . CAD (coronary artery disease) 12/15/2004  . Cardiomyopathy, idiopathic (HCC)   . CHF (congestive heart failure) (HCC)   . COPD (chronic obstructive pulmonary  disease) (HCC)   . Coronary artery disease   . Heavy cigarette smoker   . HTN (hypertension)   . Idiopathic cardiomyopathy (HCC) 07/16/2009  . Non compliance with medical treatment   . Seizures (HCC)     Family History:       Family History  Problem Relation Age of Onset  . Suicidality Sister   . CVA Mother   . Stroke Mother   . Heart disease Father   . Coronary artery disease Father   . COPD Father      Social History:   Social History        Socioeconomic History  . Marital status: Single    Spouse name: None  . Number of children: None  . Years of education: None  . Highest education level: None  Social Needs  . Financial  resource strain: None  . Food insecurity - worry: None  . Food insecurity - inability: None  . Transportation needs - medical: None  . Transportation needs - non-medical: None  Occupational History  . Occupation: Production manager: UNEMPLOYED  Tobacco Use  . Smoking status: Current Every Day Smoker    Packs/day: 2.00    Years: 58.00    Pack years: 116.00  . Smokeless tobacco: Never Used  . Tobacco comment: pt states he is down to 1ppd  Substance and Sexual Activity  . Alcohol use: Yes    Alcohol/week: 0.0 oz    Comment: EtOH use diorder - liquor  . Drug use: No  . Sexual activity: None  Other Topics Concern  . None  Social History Narrative   ** Merged History Encounter **         Review of Systems: A complete ROS was negative except as per HPI.   Physical Exam: Blood pressure 115/86, pulse 88, temperature 97.7 F (36.5 C), temperature source Oral, resp. rate (!) 25, height 5\' 11"  (1.803 m), weight 150 lb (68 kg), SpO2 95 %.  Physical Exam  Constitutional: He appears well-developed and well-nourished.  Non-toxic appearance. He does not appear ill.  HENT:  Head: Normocephalic and atraumatic.  Eyes: EOM are normal. Pupils are equal, round, and reactive to light.  Cardiovascular: Normal rate and intact distal pulses.  Irregularly irregular rhythm  Pulmonary/Chest: No accessory muscle usage. He is in respiratory distress. He has rales. He exhibits no tenderness.  Abdominal: Soft. Bowel sounds are normal. He exhibits no distension. There is no tenderness.  Neurological: He is alert.  Psychiatric: He has a normal mood and affect. His behavior is normal. His mood appears not anxious. He is not agitated.    EKG: personally reviewed my interpretation is atrial fibrillation  CXR: personally reviewed my interpretation is no tracheal deviation, left middle lobe pneumonia    Assessment & Plan by Problem:  Pneumonia with COPD  exacerbation The patient presented with shortness of breath. He was saturating at 90-98%. His lactic acid level on admission was 2.23, wbc=10.9. The patient meets sepsis criteria based on respiratory rate and heart rate, source of infection known. The patient's likely source of infection and cause of his shortness of breath is pneumonia. His chest x-ray shows left middle lobe infiltrate. Per CURB65 criteria the patient scores 1 point and is low risk with a 2.7% 30 day mortality.   The patient had severe respiratory distress on admission requiring bipap and was subsequently transitioned to 6L [place], using 3L at home. The patient's respiratory distress also causes concern for copd  exacerbation so will continue to give breathing treatments.   -trend troponin -started ceftriaxone and azithromycin  -continuous monitoring -continue spiriva, dulera, and pro-air -BNP=127  Coronary artery disease -continue metoprolol 50mg  qd -Continue losartan 25mg  daily  CHF -Continue lasix 40mg  qd  Essential Hypertension The patient's blood pressure since admission has ranged 98-140/67-101.  -continue metoprolol 50mg  qd -Continue losartan 25mg  daily  Atrial Fibrillation -continue metoprolol 50mg  qd -continue dabigatran 150mg  q12hrs  Dispo: Admit patient to Inpatient with expected length of stay greater than 2 midnights.  Signed: Lorenso CourierVahini Cesario Weidinger, MD Internal Medicine PGY1 Pager:[phone] 01/23/2017, 5:11 PM

## 2017-01-23 NOTE — ED Provider Notes (Signed)
[facility] EMERGENCY DEPARTMENT Provider Note   CSN: [id number] Arrival date & time: 01/23/17  7425     History   Chief Complaint Chief Complaint  Patient presents with  . Shortness of Breath    HPI ASHELY JOSHUA is a 66 y.o. male.  The history is provided by the patient and medical records.  Shortness of Breath  This is a new problem. The average episode lasts 1 week. The problem occurs continuously.The current episode started more than 2 days ago. The problem has been gradually worsening. Associated symptoms include a fever (subjective), rhinorrhea, cough, sputum production, wheezing, abdominal pain and leg swelling (bilateral). Pertinent negatives include no headaches, no neck pain, no hemoptysis, no chest pain, no syncope, no vomiting, no rash and no leg pain. He has tried beta-agonist inhalers for the symptoms. The treatment provided no relief. He has had prior hospitalizations. Associated medical issues include COPD, chronic lung disease, CAD and heart failure.    Past Medical History:  Diagnosis Date  . A-fib (HCC)   . AF (atrial fibrillation) (HCC)   . Alcohol abuse   . Angina pectoris   . Arrhythmia   . CAD (coronary artery disease) 12/15/2004  . Cardiomyopathy, idiopathic (HCC)   . CHF (congestive heart failure) (HCC)   . COPD (chronic obstructive pulmonary disease) (HCC)   . Coronary artery disease   . Heavy cigarette smoker   . HTN (hypertension)   . Idiopathic cardiomyopathy (HCC) 07/16/2009  . Non compliance with medical treatment   . Seizures [facility])     Patient Active Problem List   Diagnosis Date Noted  . Chronic respiratory failure with hypoxia (HCC) 10/15/2016  . Cigarette smoker 09/09/2016  . Long term current use of anticoagulant therapy 06/24/2016  . History of syncope 06/24/2016  . COPD exacerbation (HCC) 02/08/2015  . Essential hypertension 02/08/2015  . Syncope 12/10/2011  . History of noncompliance with medical treatment  12/01/2011  . Chronic systolic heart failure (HCC) 04/10/2011  . Idiopathic cardiomyopathy (HCC) 07/16/2009  . Paroxysmal atrial fibrillation (HCC) 07/16/2009  . COPD PFT's pending  07/16/2009  . CAD (coronary artery disease) 12/15/2004    Past Surgical History:  Procedure Laterality Date  . CARDIAC CATHETERIZATION  2006  . CARDIAC CATHETERIZATION  12/2004   Left   . CARDIOVERSION  01/21/2011   Procedure: CARDIOVERSION;  Surgeon: Darden Palmer., MD;  Location: [facility] OR;  Service: Cardiovascular;  Laterality: N/A;  . DOPPLER ECHOCARDIOGRAPHY  02/2011  . ELECTROPHYSIOLOGY STUDY N/A 12/16/2011   Procedure: ELECTROPHYSIOLOGY STUDY;  Surgeon: Marinus Maw, MD;  Location: [facility] CATH LAB;  Service: Cardiovascular;  Laterality: N/A;  . EP IMPLANTABLE DEVICE N/A 08/24/2015   Procedure: Loop Recorder Removal;  Surgeon: Marinus Maw, MD;  Location: [facility] INVASIVE CV LAB;  Service: Cardiovascular;  Laterality: N/A;  . fx collar bone    . NO PAST SURGERIES         Home Medications    Prior to Admission medications   Medication Sig Start Date End Date Taking? Authorizing Provider  aspirin EC 81 MG tablet Take 81 mg by mouth daily.    [provider]  dabigatran (PRADAXA) 150 MG CAPS capsule Take 150 mg by mouth 2 (two) times daily.    [provider]  Ergocalciferol (VITAMIN D2) 2000 units TABS Take 1 tablet by mouth daily.    [provider]  furosemide (LASIX) 40 MG tablet Take 40 mg by mouth daily.  [provider]  losartan (COZAAR) 25 MG tablet Take 25 mg by mouth daily.    [provider]  metoprolol (TOPROL-XL) 50 MG 24 hr tablet Take 50 mg by mouth daily.     [provider]  mometasone-formoterol (DULERA) 200-5 MCG/ACT AERO Inhale 2 puffs into the lungs 2 (two) times daily. 10/15/16   Nyoka Cowden, MD  OXYGEN 3lpm 24/7  [facility]    [provider]  PROAIR HFA 108 [phone] Base) MCG/ACT inhaler Inhale 1 puff into the lungs  every 6 (six) hours as needed for wheezing.  01/09/15   [provider]  [facility] injection Inject as directed once. 10/01/16   [provider]  spironolactone (ALDACTONE) 25 MG tablet Take 25 mg by mouth daily.    [provider]  Tiotropium Bromide Monohydrate (SPIRIVA RESPIMAT) 2.5 MCG/ACT AERS Inhale 2 puffs into the lungs daily. 10/15/16   Nyoka Cowden, MD    Family History Family History  Problem Relation Age of Onset  . Suicidality Sister   . CVA Mother   . Stroke Mother   . Heart disease Father   . Coronary artery disease Father   . COPD Father     Social History Social History   Tobacco Use  . Smoking status: Current Every Day Smoker    Packs/day: 2.00    Years: 58.00    Pack years: 116.00  . Smokeless tobacco: Never Used  . Tobacco comment: pt states he is down to 1ppd  Substance Use Topics  . Alcohol use: Yes    Alcohol/week: 0.0 oz  . Drug use: No     Allergies   Patient has no known allergies.   Review of Systems Review of Systems  Constitutional: Positive for chills, diaphoresis, fatigue and fever (subjective).  HENT: Positive for congestion and rhinorrhea.   Eyes: Negative for visual disturbance.  Respiratory: Positive for cough, sputum production, chest tightness, shortness of breath and wheezing. Negative for hemoptysis.   Cardiovascular: Positive for leg swelling (bilateral). Negative for chest pain and syncope.  Gastrointestinal: Positive for abdominal pain. Negative for constipation, diarrhea, nausea and vomiting.  Genitourinary: Negative for dysuria.  Musculoskeletal: Negative for gait problem, neck pain and neck stiffness.  Skin: Negative for rash and wound.  Neurological: Negative for light-headedness and headaches.  Psychiatric/Behavioral: Negative for agitation.  All other systems reviewed and are negative.    Physical Exam Updated Vital Signs BP 116/79   Pulse (!) 166   Temp (!) 97.5 F (36.4 C) (Oral)    Resp (!) 41   SpO2 92%   Physical Exam  Constitutional: He appears well-developed and well-nourished.  Non-toxic appearance. He does not appear ill. He appears distressed.  HENT:  Head: Normocephalic.  Nose: Nose normal.  Mouth/Throat: Oropharynx is clear and moist. No oropharyngeal exudate.  Eyes: Conjunctivae and EOM are normal. Pupils are equal, round, and reactive to light.  Neck: Normal range of motion.  Cardiovascular: Intact distal pulses. Tachycardia present.  No murmur heard. Pulmonary/Chest: No stridor. He is in respiratory distress. He has wheezes. He has rales. He exhibits no tenderness.  Abdominal: Soft. He exhibits no distension. There is no tenderness. There is no guarding.  Musculoskeletal: He exhibits no tenderness.  Neurological: No sensory deficit. He exhibits normal muscle tone.  Skin: Capillary refill takes less than 2 seconds. He is diaphoretic. No erythema. No pallor.  Nursing note and vitals reviewed.    ED Treatments / Results  Labs (all labs ordered  are listed, but only abnormal results are displayed) Labs Reviewed  CBC WITH DIFFERENTIAL/PLATELET - Abnormal; Notable for the following components:      Result Value   WBC 10.9 (*)    Neutro Abs 9.0 (*)    All other components within normal limits  COMPREHENSIVE METABOLIC PANEL - Abnormal; Notable for the following components:   Chloride 89 (*)    CO2 33 (*)    Glucose, Bld 152 (*)    Albumin 3.2 (*)    All other components within normal limits  BRAIN NATRIURETIC PEPTIDE - Abnormal; Notable for the following components:   B Natriuretic Peptide 127.7 (*)    All other components within normal limits  LACTIC ACID, PLASMA - Abnormal; Notable for the following components:   Lactic Acid, Venous 3.8 (*)    All other components within normal limits  I-STAT CG4 LACTIC ACID, ED - Abnormal; Notable for the following components:   Lactic Acid, Venous 2.15 (*)    All other components within normal limits  I-STAT  CG4 LACTIC ACID, ED - Abnormal; Notable for the following components:   Lactic Acid, Venous 2.23 (*)    All other components within normal limits  CULTURE, BLOOD (ROUTINE X 2)  CULTURE, BLOOD (ROUTINE X 2)  LIPASE, BLOOD  PROTIME-INR  CBC  BASIC METABOLIC PANEL  HIV ANTIBODY (ROUTINE TESTING)  TROPONIN I  TROPONIN I  TROPONIN I  LACTIC ACID, PLASMA  LACTIC ACID, PLASMA  I-STAT TROPONIN, ED  I-STAT TROPONIN, ED    EKG  EKG Interpretation  Date/Time:  Friday January 23 2017 08:39:43 EST Ventricular Rate:  156 PR Interval:    QRS Duration: 92 QT Interval:  311 QTC Calculation: 497 R Axis:   -83 Text Interpretation:  Sinus tachycardia with irregular rate Left anterior fascicular block Anteroseptal infarct, old Repolarization abnormality, prob rate related Borderline prolonged QT interval Baseline wander in lead(s) II III aVR aVF V1 V3 V5 V6 Sinus tach vs Afib with RVR.  No STEMI Confirmed by Theda Belfast ([id number]) on 01/23/2017 8:50:45 AM Also confirmed by Theda Belfast ([id number]), editor Barbette Hair [phone])  on 01/23/2017 9:09:51 AM       Radiology Dg Chest Portable 1 View  Result Date: 01/23/2017 CLINICAL DATA:  Shortness of breath. EXAM: PORTABLE CHEST 1 VIEW COMPARISON:  05/19/2016 FINDINGS: Suspect reticulonodular opacity on the left. There is large lung volumes and possible emphysema. Normal heart size mediastinal contours. No effusion or pneumothorax. EKG leads create artifact across the chest. IMPRESSION: 1. Possible early pneumonia on the left. 2. COPD. Electronically Signed   By: Marnee Spring M.D.   On: 01/23/2017 09:07    Procedures Procedures (including critical care time)  Medications Ordered in ED Medications  diltiazem (CARDIZEM) 1 mg/mL load via infusion 10 mg (10 mg Intravenous Bolus from Bag 01/23/17 1416)    And  diltiazem (CARDIZEM) 100 mg in dextrose 5 % 100 mL (1 mg/mL) infusion (5 mg/hr Intravenous New Bag/Given 01/23/17 1417)  aspirin EC tablet  81 mg (not administered)  mometasone-formoterol (DULERA) 200-5 MCG/ACT inhaler 2 puff (2 puffs Inhalation Given 01/23/17 2047)  tiotropium (SPIRIVA) inhalation capsule 18 mcg (not administered)  acetaminophen (TYLENOL) tablet 650 mg (not administered)    Or  acetaminophen (TYLENOL) suppository 650 mg (not administered)  ipratropium-albuterol (DUONEB) 0.5-2.5 (3) MG/3ML nebulizer solution 3 mL (3 mLs Nebulization Given 01/23/17 2046)  azithromycin (ZITHROMAX) 250 mg in dextrose 5 % 125 mL IVPB (not administered)  dabigatran (PRADAXA) capsule 150 mg (not  administered)  cefTRIAXone (ROCEPHIN) 1 g in dextrose 5 % 50 mL IVPB (not administered)  sodium chloride 0.9 % bolus 1,000 mL (not administered)  magnesium sulfate IVPB 2 g 50 mL (0 g Intravenous Stopped 01/23/17 1120)  albuterol (PROVENTIL,VENTOLIN) solution continuous neb (10 mg/hr Nebulization Given 01/23/17 0856)  cefTRIAXone (ROCEPHIN) 1 g in dextrose 5 % 50 mL IVPB (0 g Intravenous Stopped 01/23/17 1155)  azithromycin (ZITHROMAX) 500 mg in dextrose 5 % 250 mL IVPB (0 mg Intravenous Stopped 01/23/17 1306)  sodium chloride 0.9 % bolus 1,000 mL (0 mLs Intravenous Stopped 01/23/17 1418)     Initial Impression / Assessment and Plan / ED Course  I have reviewed the triage vital signs and the nursing notes.  Pertinent labs & imaging results that were available during my care of the patient were reviewed by me and considered in my medical decision making (see chart for details).     Elias ElseJames P Theall is a 66 y.o. male with a past medical history significant for COPD on home oxygen, CHF, hypertension, CAD, and prior atrial fibrillation who presents with subjective fevers, chills, productive cough, shortness of breath, and fatigue.  Patient reports that for the last week he has had gradually worsening shortness of breath.  He reports that he has not been on his blood pressure medicine for around 1 year.  He says that he is continue to use breathing  treatments and oxygen at home however today it got to the point he could not breathe.  According to the patient his home pulse ox read 65% when he called EMS.  EMS gave the patient a continuous albuterol treatment as well as Atrovent.  Patient received Solu-Medrol with EMS.  Patient says his breathing is slightly improved upon arrival to the ED.  Patient is now in the upper 90s on 6 L by nonrebreather and breathing treatment.  Patient denies any chest pain.  He reports chronic left shoulder pain that is unchanged.  He reports he is coughing up thick sputum with no blood in it.  He denies nausea, vomiting, confusion, diarrhea, or any urinary symptoms.  He is concerned that his legs are slightly more swollen than before although he is taking his diuretics as directed.  Patient's blood pressure was elevated but did not appear to be in hypertensive crisis.  Patient was however found to be tachycardic in the 150s-170s after albuterol was initiated.  On exam, patient is tachycardic in the 160s.  Initial EKG appears to be a irregular rate sinus tachycardia versus atrial fibrillation with RVR.  Due to patient's significant respiratory rate and wandering baseline is difficult to differentiate at this time.  Patient had a respiratory end expiratory wheezing throughout as well as crackles in the bases.  Patient was given magnesium and continuous nebulizer treatments.  Given the subjectively worsened edema in the legs and crackles in the lungs, he will have imaging to look for CHF exacerbation with pulmonary edema or even pneumonia with a cough.  Patient will have troponin and BNP collected.  Abdomen and chest were nontender.  No focal neurologic deficits seen.  Anticipate reassessment after diagnostic testing.     Workup revealed PNA on Left side. GIven symptoms and hypoxia, ot will be treated for CAP. Pt also found to be in Afib with RVR on repeat ECG. Dilt started. PT admitted for further management.    Final  Clinical Impressions(s) / ED Diagnoses   Final diagnoses:  Hypoxia  Shortness of breath  Community  acquired pneumonia, unspecified laterality    Clinical Impression: 1. Hypoxia   2. Shortness of breath   3. Community acquired pneumonia, unspecified laterality     Disposition: Admit  This note was prepared with assistance of Conservation officer, historic buildings. Occasional wrong-word or sound-a-like substitutions may have occurred due to the inherent limitations of voice recognition software.     Catina Nuss, Canary Brim, MD 01/23/17 2050

## 2017-01-23 NOTE — Progress Notes (Signed)
PT has been weaned off Bipap to 6L [place].  PT stable Bipap not indicated at this time. RT will continue to monitor.

## 2017-01-23 NOTE — Progress Notes (Signed)
Pt taken off bipap and placed on 4L [place] (wears 3 at home). Pt denies SOB, continues to have increased RR. Pt asking for water. RN aware. RT will continue to monitor.

## 2017-01-23 NOTE — Progress Notes (Signed)
Patient arrived to the room. VS B/p 115/86 Heart rate 117, Ox 91, Resp 26. Patient is a smoker and smokes 6-7 packs a day. Patient is alert and oriented, calm and cooperative. Patient denies any pain. Patient states that he is comfortable.

## 2017-01-24 DIAGNOSIS — I10 Essential (primary) hypertension: Secondary | ICD-10-CM

## 2017-01-24 LAB — BASIC METABOLIC PANEL
Anion gap: 10 (ref 5–15)
BUN: 16 mg/dL (ref 6–20)
CO2: 36 mmol/L — ABNORMAL HIGH (ref 22–32)
Calcium: 8.8 mg/dL — ABNORMAL LOW (ref 8.9–10.3)
Chloride: 90 mmol/L — ABNORMAL LOW (ref 101–111)
Creatinine, Ser: 0.94 mg/dL (ref 0.61–1.24)
GFR calc Af Amer: >60 mL/min (ref >60)
GFR calc non Af Amer: >60 mL/min (ref >60)
Glucose, Bld: 216 mg/dL — ABNORMAL HIGH (ref 65–99)
Potassium: 3.5 mmol/L (ref 3.5–5.1)
Sodium: 136 mmol/L (ref 135–145)

## 2017-01-24 LAB — CBC
HCT: 36.6 % — ABNORMAL LOW (ref 39.0–52.0)
Hemoglobin: 11.8 g/dL — ABNORMAL LOW (ref 13.0–17.0)
MCH: 30.1 pg (ref 26.0–34.0)
MCHC: 32.2 g/dL (ref 30.0–36.0)
MCV: 93.4 fL (ref 78.0–100.0)
Platelets: 308 10*3/uL (ref 150–400)
RBC: 3.92 MIL/uL — ABNORMAL LOW (ref 4.22–5.81)
RDW: 13.3 % (ref 11.5–15.5)
WBC: 12.5 10*3/uL — ABNORMAL HIGH (ref 4.0–10.5)

## 2017-01-24 LAB — TSH: TSH: 1.798 u[IU]/mL (ref 0.350–4.500)

## 2017-01-24 LAB — TROPONIN I: Troponin I: 0.03 ng/mL (ref <0.03)

## 2017-01-24 LAB — LACTIC ACID, PLASMA: Lactic Acid, Venous: 2.2 mmol/L (ref 0.5–1.9)

## 2017-01-24 LAB — HIV ANTIBODY (ROUTINE TESTING W REFLEX): HIV Screen 4th Generation wRfx: NONREACTIVE

## 2017-01-24 MED ORDER — LEVALBUTEROL HCL 0.63 MG/3ML IN NEBU
0.6300 mg | INHALATION_SOLUTION | Freq: Four times a day (QID) | RESPIRATORY_TRACT | Status: DC
  Administered 2017-01-24 – 2017-01-27 (×13): 0.63 mg via RESPIRATORY_TRACT
  Filled 2017-01-24 (×14): qty 3

## 2017-01-24 MED ORDER — IPRATROPIUM BROMIDE 0.02 % IN SOLN
0.5000 mg | Freq: Four times a day (QID) | RESPIRATORY_TRACT | Status: DC
  Administered 2017-01-24 – 2017-01-27 (×13): 0.5 mg via RESPIRATORY_TRACT
  Filled 2017-01-24 (×13): qty 2.5

## 2017-01-24 MED ORDER — LORAZEPAM 2 MG/ML IJ SOLN: 2.0000 mg | Freq: Once | INTRAMUSCULAR | Status: DC

## 2017-01-24 MED ORDER — METOPROLOL SUCCINATE ER 50 MG PO TB24
50.0000 mg | ORAL_TABLET | Freq: Every day | ORAL | Status: DC
  Administered 2017-01-24 – 2017-01-26 (×3): 50 mg via ORAL
  Filled 2017-01-24 (×3): qty 1

## 2017-01-24 NOTE — Progress Notes (Signed)
Date: 01/24/2017  Patient name: Johnny Rodriguez  Medical record number: [id number]  Date of birth: [birth year]   I have seen and evaluated Johnny Rodriguez and discussed their care with the Residency Team. [name] is a 66 year old man with oxygen-dependent COPD, atrial fibrillation, and CAD. He presents with acute on chronic dyspnea. For about a year, he has dyspnea with activities of daily living. For the past 2 days, his dyspnea has worsened. Please see Dr. Cannon Kettlehundi's H&P for more details.  This morning, he feels better. His heart rate is now around the 100 on Cardizem 7.5 drip. He denies feeling any palpitations or tachycardia when he is in A. fib with RVR. He states he did have an aide in the past but was unhappy with the service as she was on her phone with other clients rather than assisting him. He is willing to retry having an aide.  PMHx, Fam Hx, and/or Soc Hx : O2 dependent COPD. Lives alone.  Vitals:   01/24/17 1200 01/24/17 1316  BP: 123/74   Pulse: (!) 102   Resp: 18   Temp: 97.6 F (36.4 C)   SpO2: 90% 96%  Gen NAD, able to speak in full sentences Hirr irr no MRG L very poor air flow, therefore, no appreciable wheezing ABD protuberant, + BS Ext no edema Skin no rash, no warmth Very mild temporal wasting  I personally viewed the CXR images and confirmed my reading with the official read. AP portable upright, aortic calcification, no overt abnl  I personally viewed the EKG and confirmed my reading with the official read. A Fib with RVR, L anterior fascicular block, NSTW changes  Assessment and Plan: I have seen and evaluated the patient as outlined above. I agree with the formulated Assessment and Plan as detailed in the residents' note, with the following changes: [name] is a 66 year old man with oxygen-dependent COPD and A. fib. He presents with acute on chronic dyspnea and was found to be in A. fib with RVR. While his chest x-ray did not show any overt pneumonia, the  official radiology read suggested the possibility of an early left sided infiltrate. It is not clear the initial insult. A. fib could have caused increased dyspnea due to the RVR. Dr. Thurston HoleWert's last 2 notes indicated he did have end-expiratory wheezing even as an outpatient so may be the wheezing he had on presentation is his baseline. The other scenario could be that he does have an early left sided pneumonia which triggered a COPD exacerbation which I'll triggered his A. fib with RVR. We will be treating all 3 possible underlying etiologies.  1. Acute on chronic hypoxic and hypercapnic respiratory failure 2. Possible early left-sided community-acquired pneumonia 3. Chronic oxygen-dependent COPD  He is on azithromycin and Rocephin to treat the community-acquired pneumonia and possible COPD exacerbation. His home inhalers have been continued including Atrovent, Xopenex, Dulera, and Spiriva. He did get large doses of steroids in the ED but there is no reason to continue steroids currently. He is on supplemental oxygen and will be weaned to his home dose of 3 L as tolerated.  4. A. fib with RVR - he was started on a Cardizem drip in the ED and we will titrate that down as possible. Hopefully, as his respiratory status improves, we will be able to get him off the Cardizem drip. He has ruled out for an acute MI as the etiology of his A. fib with RVR. His last TSH in our  system was in 2016. He undoubtedly had a TSH at the [place] that we can never get those records so we will check TSH during this hospitalization to rule out hyperthyroidism as the etiology of his A. fib with RVR. His respiratory status is stable to resume his metoprolol which will be restarted once he is off his Cardizem drip. He had been on amiodarone but that was stopped as an outpatient due to his pulmonary function. If we cannot achieve rate control with oral medications, we will need to consult cardiology to consider antiarrhythmics.  5. Goals of  care / end stage COPD / inability to perform ADL's - he is willing to consider home health services again. We will obtain PT OT consult to see is that we will fulfill his skilled nursing need. If not, he would her the need for medication management and disease education as his skilled need. He is also willing to discuss outpatient palliative care services so we will get that arranged at discharge. I think he needs a lot of education about the coming months/years and developed a plan in case his status declines.  Burns Spain, MD 12/15/20181:31 PM

## 2017-01-24 NOTE — Progress Notes (Signed)
CRITICAL VALUE ALERT  Critical Value: Lactic acid-2.9  Date & Time Notied: 01/23/17 @ 2230  Provider Notified: Chundi  Orders Received/Actions taken: serial lactic levels ordered; continue to monitor.

## 2017-01-24 NOTE — Progress Notes (Signed)
Subjective: Patient states he feels a little better this AM. He states his breathing has improved. He denies CP, fever, or nausea. Does endorse some chills. He denies palpitations or feeling as if his heart is fluttering racing, or skipping beats.   Had a long discussion with the patient about home health services and palliative care. The patient said it takes him about an hour or more to get dressed because he becomes so SOB. He also has difficulty with bathing and cooking meals for himself. He also has difficulty getting his medications. He previously had home health and was not pleased with the services that were provided. He felt the home health aids were busy with other patients and did not help him enough. He is interested in home health services with and other company, and states he would like to have support. Palliative care and symptom management was also discussed with the patient. He is also interested in learning more about palliative care.   Objective:  Vital signs in last 24 hours: Vitals:   01/24/17 0610 01/24/17 0721 01/24/17 0834 01/24/17 1200  BP: 113/77  109/73 123/74  Pulse: 81  100 (!) 102  Resp: (!) 30  (!) 21 18  Temp: 97.7 F (36.5 C)   97.6 F (36.4 C)  TempSrc: Oral   Oral  SpO2: 91% 94% 91% 90%  Weight: 157 lb 3.2 oz (71.3 kg)     Height:       General: Laying in bed, NAD HEENT: [place]/AT, EOMI, no scleral icterus Cardiac: Tachycardic irregular rhythm , No R/M/G appreciated Pulm: mildly dyspneic when talking, increased respiratory effort, poor aeration throughout all lungs fields, scattered course breath sounds and expiratory wheeze Abd: soft, non tender, non distended, BS normal Ext: extremities well perfused, no peripheral edema Neuro: alert and oriented X3, cranial nerves II-XII grossly intact   Assessment/Plan:  Active Problems:   Pneumonia  Pneumonia with COPD exacerbation COPD chronically on 3L [place], presenting with worsening SOB and cough, with  possible left middle lobe infiltrate on CXR. Patient required BiPAP on arrival to ED, but now is saturating in the low 90s on 6 liters of [place]. Patients functional status declining and his DOE has been progressing for the last year. Currently treating for pneumonia with supportive treatments for COPD. Patient met sepsis criteria on admission. WBC count increased to 12.5 to 10.9, but could be reactive after receiving solumedrol. Lactic acid trended down to 2.2. Serial troponin negative.  -Continue Ceftriaxone and azithromycin  -Scheduled duonebs -holding further prednisone treatment at this time (received 1 dose solumedrol in ED) -continue spiriva, dulera, and pro-air -Titrate supplemental oxygen as tolerated towards 3 liters  -due to difficulty with performing ADLs will get PT/OT to evaluate and treat the patient when more stable, as well as care management consult for home health  -Will do outpatient Palliative care referral   Atrial Fibrillation Patient presented to the ED in a fib with RVR, which could also be contributing to his shortness of breath and elevated lactic acid levels. Most likely due to possible pneumonia and COPD exacerbation. Previously rate controlled on Toprol XL 50 mg. CVA ppx with dabigatran. Overnight patients rates improved from 160s to range of 75-100 on the diltiazem drip.  -Titrate down diltiazem drip with goal to get patient back on home dose metoprolol   Essential Hypertension Currently normotensive. -Continue losartan 2m daily -holding metoprolol while on dilt gtt    Dispo: Anticipated discharge in approximately 2-3 day(s).   Lacroce,  Hulen Shouts, MD 01/24/2017, 1:06 PM Pager: [phone]

## 2017-01-24 NOTE — Progress Notes (Signed)
Patient does not have any c/o being short of breath at this time. Patient on oxygen set at 6lpm with SP02=94%. Bipap is in patients room on standby at this time.

## 2017-01-25 DIAGNOSIS — I5022 Chronic systolic (congestive) heart failure: Secondary | ICD-10-CM

## 2017-01-25 DIAGNOSIS — J449 Chronic obstructive pulmonary disease, unspecified: Secondary | ICD-10-CM

## 2017-01-25 LAB — CBC
HCT: 36.9 % — ABNORMAL LOW (ref 39.0–52.0)
Hemoglobin: 11.9 g/dL — ABNORMAL LOW (ref 13.0–17.0)
MCH: 30.2 pg (ref 26.0–34.0)
MCHC: 32.2 g/dL (ref 30.0–36.0)
MCV: 93.7 fL (ref 78.0–100.0)
Platelets: 328 10*3/uL (ref 150–400)
RBC: 3.94 MIL/uL — ABNORMAL LOW (ref 4.22–5.81)
RDW: 13.5 % (ref 11.5–15.5)
WBC: 11.9 10*3/uL — ABNORMAL HIGH (ref 4.0–10.5)

## 2017-01-25 LAB — BASIC METABOLIC PANEL
Anion gap: 8 (ref 5–15)
BUN: 13 mg/dL (ref 6–20)
CO2: 33 mmol/L — ABNORMAL HIGH (ref 22–32)
Calcium: 8.4 mg/dL — ABNORMAL LOW (ref 8.9–10.3)
Chloride: 95 mmol/L — ABNORMAL LOW (ref 101–111)
Creatinine, Ser: 0.86 mg/dL (ref 0.61–1.24)
GFR calc Af Amer: >60 mL/min (ref >60)
GFR calc non Af Amer: >60 mL/min (ref >60)
Glucose, Bld: 133 mg/dL — ABNORMAL HIGH (ref 65–99)
Potassium: 3.6 mmol/L (ref 3.5–5.1)
Sodium: 136 mmol/L (ref 135–145)

## 2017-01-25 MED ORDER — SENNOSIDES-DOCUSATE SODIUM 8.6-50 MG PO TABS
1.0000 | ORAL_TABLET | Freq: Every day | ORAL | Status: DC
  Administered 2017-01-25: 1 via ORAL
  Filled 2017-01-25 (×2): qty 1

## 2017-01-25 MED ORDER — POLYETHYLENE GLYCOL 3350 17 G PO PACK
17.0000 g | PACK | Freq: Every day | ORAL | Status: DC
  Administered 2017-01-25: 17 g via ORAL
  Filled 2017-01-25 (×3): qty 1

## 2017-01-25 NOTE — Evaluation (Signed)
Physical Therapy Evaluation Patient Details Name: Johnny Rodriguez MRN: [id number] DOB: [birth year] Today's Date: 01/25/2017   History of Present Illness  Pt is a 66 y.o. male with oxygen-dependent COPD, atrial fibrillation, and CAD admitted for pneumonia with COPD exacerbation and Afib with RVR.   Clinical Impression  Patient presents with problems listed below.  Will benefit from acute PT to maximize mobility prior to discharge.  Patient was independent with mobility, gait, ADL's pta.  Today patient with decreased strength, balance, activity tolerance, all impacting mobility and gait.  Recommend ST-SNF at d/c for continued therapy.    Follow Up Recommendations SNF;Supervision for mobility/OOB    Equipment Recommendations  Rolling walker with 5" wheels    Recommendations for Other Services       Precautions / Restrictions Precautions Precautions: Fall Precaution Comments: Watch HR and O2 sats Restrictions Weight Bearing Restrictions: No      Mobility  Bed Mobility Overal bed mobility: Needs Assistance Bed Mobility: Supine to Sit;Sit to Supine     Supine to sit: Supervision Sit to supine: Supervision   General bed mobility comments: Supervision for safety.   Transfers Overall transfer level: Needs assistance Equipment used: Rolling walker (2 wheeled) Transfers: Sit to/from Stand Sit to Stand: Min assist         General transfer comment: Min assist to shift trunk forward to stand, and to steady during transfers.  Ambulation/Gait Ambulation/Gait assistance: Min guard Ambulation Distance (Feet): 24 Feet Assistive device: Rolling walker (2 wheeled) Gait Pattern/deviations: Step-through pattern;Decreased stride length Gait velocity: decreased Gait velocity interpretation: Below normal speed for age/gender General Gait Details: Cues for safe use of RW.  Able to maintain balance with RW.  Cues to move slowly.  Ambulated on 6L O2.   During gait, patient's HR increased to  max of 143, changing quickly within 108-143 range.  O2 sats ranged 86% - 93% on 6L O2.  Ended session with increased HR and decreased O2 sats.  Stairs            Wheelchair Mobility    Modified Rankin (Stroke Patients Only)       Balance Overall balance assessment: Needs assistance Sitting-balance support: No upper extremity supported;Feet supported Sitting balance-Leahy Scale: Good     Standing balance support: No upper extremity supported;During functional activity Standing balance-Leahy Scale: Fair Standing balance comment: Able to maintain static standing balance.  Assist needed with dynamic activities/gait.                             Pertinent Vitals/Pain Pain Assessment: Faces Faces Pain Scale: Hurts little more Pain Location: Generalized when coughing Pain Descriptors / Indicators: Aching;Sore Pain Intervention(s): Limited activity within patient's tolerance;Monitored during session;Repositioned    Home Living Family/patient expects to be discharged to:: Other (Comment)(Hotel with 2 flights of stairs) Living Arrangements: Alone Available Help at Discharge: Other (Comment)(Patient reports "nobody") Type of Home: Other(Comment)(Motel with 2 flights of stairs to get to room and tub shower) Home Access: Stairs to enter Entrance Stairs-Rails: Right;Left Entrance Stairs-Number of Steps: 2 flights Home Layout: One level Home Equipment: None Additional Comments: Motel with 2 flights of stairs to get to room and tub shower.     Prior Function Level of Independence: Independent         Comments: Independent but easily fatigues. Requires frequent rest breaks. Uses [employer] for transportation.      Hand Dominance   Dominant Hand: Right  Extremity/Trunk Assessment   Upper Extremity Assessment Upper Extremity Assessment: Defer to OT evaluation    Lower Extremity Assessment Lower Extremity Assessment: Generalized weakness    Cervical / Trunk  Assessment Cervical / Trunk Assessment: Normal  Communication   Communication: No difficulties  Cognition Arousal/Alertness: Awake/alert Behavior During Therapy: WFL for tasks assessed/performed Overall Cognitive Status: Within Functional Limits for tasks assessed                                        General Comments General comments (skin integrity, edema, etc.): HR increased with coughing and mobility, with max of 143 bpm.  O2 sats decreased to 86%.  Ended mobility at that point.    Exercises     Assessment/Plan    PT Assessment Patient needs continued PT services  PT Problem List Decreased strength;Decreased activity tolerance;Decreased balance;Decreased mobility;Decreased knowledge of use of DME;Cardiopulmonary status limiting activity       PT Treatment Interventions DME instruction;Gait training;Functional mobility training;Therapeutic activities;Therapeutic exercise;Patient/family education    PT Goals (Current goals can be found in the Care Plan section)  Acute Rehab PT Goals Patient Stated Goal: Get stronger PT Goal Formulation: With patient Time For Goal Achievement: 02/01/17 Potential to Achieve Goals: Good    Frequency Min 3X/week   Barriers to discharge Inaccessible home environment;Decreased caregiver support 2 flights of stairs to enter hotel room.  Lives alone.    Co-evaluation               AM-PAC PT "6 Clicks" Daily Activity  Outcome Measure Difficulty turning over in bed (including adjusting bedclothes, sheets and blankets)?: None Difficulty moving from lying on back to sitting on the side of the bed? : None Difficulty sitting down on and standing up from a chair with arms (e.g., wheelchair, bedside commode, etc,.)?: Unable Help needed moving to and from a bed to chair (including a wheelchair)?: A Little Help needed walking in hospital room?: A Little Help needed climbing 3-5 steps with a railing? : A Lot 6 Click Score: 17     End of Session Equipment Utilized During Treatment: Gait belt;Oxygen Activity Tolerance: Patient limited by fatigue;Treatment limited secondary to medical complications (Comment)(Afib with HR max 143) Patient left: in bed;with call bell/phone within reach Nurse Communication: Mobility status(HR changes with mobility) PT Visit Diagnosis: Unsteadiness on feet (R26.81);Other abnormalities of gait and mobility (R26.89);Muscle weakness (generalized) (M62.81)    Time: 3244-0102 PT Time Calculation (min) (ACUTE ONLY): 23 min   Charges:   PT Evaluation $PT Eval Moderate Complexity: 1 Mod PT Treatments $Gait Training: 8-22 mins   PT G Codes:        Durenda Hurt. Renaldo Fiddler, [facility] Acute Rehab Services Pager [phone]   Johnny Rodriguez 01/25/2017, 6:59 PM

## 2017-01-25 NOTE — Progress Notes (Signed)
   Subjective:  Patient states he feels about the same as yesterday. Over all since admission, he states his breathing is somewhat improved, and his chest is less tight. He states he is able to expectorate more phlegm today.   He is concerned about his ability to perform ADLs due to his condition. He currently lives in a motel that requires going up a flight of stairs - this puts a significant strain on the patient due to his breathing and his chronic pain. He feels even in his room he would need a walker as he feels unsteady walking without holding onto a railing or similar. He lives by himself and has no close friends or family for assistance.  Objective:  Vital signs in last 24 hours: Vitals:   01/25/17 0442 01/25/17 0846 01/25/17 0852 01/25/17 0856  BP: 118/78     Pulse: 85     Resp: (!) 29     Temp: (!) 97.5 F (36.4 C)     TempSrc: Oral     SpO2: 93% 91% 90% 92%  Weight: 159 lb 14.4 oz (72.5 kg)     Height:       General: Sitting up in bed, NAD Cardiac: irregular rhythm, No R/M/G appreciated Pulm: mildly dyspneic when talking, poor aeration throughout all lungs fields, mild diffuse wheezing Abd: soft, distended, non tender, BS normal Ext: extremities well perfused, no peripheral edema Neuro: alert and oriented X3, cranial nerves II-XII grossly intact  Assessment/Plan:  Active Problems:   Paroxysmal atrial fibrillation (HCC)   Chronic respiratory failure with hypoxia (HCC)   Pneumonia  CAP COPD: COPD chronically on 3L [place], presenting with worsening SOB and cough, with possible left middle lobe infiltrate on CXR. Patient required BiPAP on arrival to ED, but now is saturating in the low 90s on 6 liters of [place]. Patients functional status declining and his DOE has been progressing for the last year. Currently treating for pneumonia with supportive treatments for COPD.   -Continue Ceftriaxone and azithromycin  -Scheduled duonebs -continue spiriva, dulera, and pro-air -Titrate  supplemental oxygen as tolerated towards 3 liters  -due to difficulty with performing ADLs will get PT/OT to evaluate and treat the patient when more stable, as well as care management consult for home health  -Will do outpatient Palliative care referral   Atrial Fibrillation Patient presented to the ED in a fib with RVR, which could also be contributing to his shortness of breath and elevated lactic acid levels. Most likely due to possible pneumonia and COPD exacerbation. Previously rate controlled on Toprol XL 50 mg. CVA ppx with dabigatran. Rates on tele overnight mainly in 80-90s. -stop diltiazem gtt; may need to restart if rates become uncontrolled with oral regimen -continue metoprolol 50mg  daily; if needing increased dosing, may need to switch to PO diltiazem from BB due to COPD  Essential Hypertension Currently normotensive. -Continue losartan 25mg  daily -restarted metoprolol 50mg  daily  Chronic systolic CHF: Last echo in our system shows normalization of EF to 50-55% in 2008, though notes state EF 40% in 2014. He appears euvolemic on exam today. -Echo -metoprolol, losartan -holding lasix for now  Dispo: Anticipated discharge in approximately 2-3 day(s); getting PT/OT recs, may need placement or at least [facility]; currently lives in [place].   Nyra Market, MD 01/25/2017, 10:11 AM Pager: [phone]

## 2017-01-25 NOTE — Evaluation (Signed)
Occupational Therapy Evaluation Patient Details Name: Johnny Rodriguez MRN: [id number] DOB: August 14, 1950 Today's Date: 01/25/2017    History of Present Illness Pt is a 66 y.o. male with oxygen-dependent COPD, atrial fibrillation, and CAD admitted for pneumonia with COPD exacerbation.    Clinical Impression   PTA, pt was independent with ADL and functional mobility. He does report increasing difficulty with all tasks lately due to increased shortness of breath. Pt presents to OT with decreased activity tolerance for ADL, generalized weakness, and significant dyspnea on exertion. He required multiple therapeutic rest breaks throughout all ADL this session, min assist for toilet transfers, and min guard assist for LB ADL. Pt will need 24 hour assistance post-acute D/C to maintain safety and he reports that he does not have any family available to assist him. Feel at current functional level, pt would benefit from short-term SNF level rehabilitation to maximize return to independence. Will continue to follow while admitted.     Follow Up Recommendations  SNF;Supervision/Assistance - 24 hour    Equipment Recommendations  None recommended by OT    Recommendations for Other Services       Precautions / Restrictions Precautions Precautions: Fall Precaution Comments: Watch O2 Restrictions Weight Bearing Restrictions: No      Mobility Bed Mobility Overal bed mobility: Needs Assistance Bed Mobility: Supine to Sit     Supine to sit: Supervision     General bed mobility comments: Supervision for safety.   Transfers Overall transfer level: Needs assistance Equipment used: None Transfers: Sit to/from Stand Sit to Stand: Min guard;Min assist         General transfer comment: Min guard assist for power-up. Min assist to steady during functional mobility.     Balance Overall balance assessment: Needs assistance Sitting-balance support: No upper extremity supported;Feet  supported Sitting balance-Leahy Scale: Good     Standing balance support: No upper extremity supported;During functional activity Standing balance-Leahy Scale: Fair Standing balance comment: Statically able to stand without assist.                            ADL either performed or assessed with clinical judgement   ADL Overall ADL's : Needs assistance/impaired Eating/Feeding: Set up;Sitting   Grooming: Supervision/safety;Standing   Upper Body Bathing: Set up;Sitting   Lower Body Bathing: Min guard;Sit to/from stand   Upper Body Dressing : Set up;Sitting   Lower Body Dressing: Min guard;Sit to/from stand   Toilet Transfer: Ambulation;Minimal assistance Toilet Transfer Details (indicate cue type and reason): Up to min assist without AD as pt was unsteady especially as he fatigued.  Toileting- Architect and Hygiene: Min guard;Sit to/from stand       Functional mobility during ADLs: Minimal assistance General ADL Comments: Pt requiring frequent rest breaks.      Vision Patient Visual Report: No change from baseline Vision Assessment?: No apparent visual deficits     Perception     Praxis      Pertinent Vitals/Pain Pain Assessment: Faces Faces Pain Scale: Hurts little more Pain Location: Generalized when coughing Pain Descriptors / Indicators: Aching;Sore Pain Intervention(s): Limited activity within patient's tolerance;Monitored during session;Repositioned     Hand Dominance     Extremity/Trunk Assessment Upper Extremity Assessment Upper Extremity Assessment: Generalized weakness   Lower Extremity Assessment Lower Extremity Assessment: Generalized weakness       Communication Communication Communication: No difficulties   Cognition Arousal/Alertness: Awake/alert Behavior During Therapy: WFL for tasks assessed/performed Overall  Cognitive Status: Within Functional Limits for tasks assessed                                      General Comments  HR up to 150 with coughing fit following mobility. SpO2 desaturation to 84% on 6L supplemental O2 via [place] with rebound to 90% with breathing techniques.     Exercises     Shoulder Instructions      Home Living Family/patient expects to be discharged to:: Other (Comment)                                 Additional Comments: Motel with 2 flights of stairs to get to room and tub shower.       Prior Functioning/Environment Level of Independence: Independent        Comments: Independent but easily fatigues. Requires frequent rest breaks. Uses [employer] for transportation.         OT Problem List: Decreased activity tolerance;Impaired balance (sitting and/or standing);Decreased safety awareness;Decreased knowledge of use of DME or AE;Decreased knowledge of precautions;Cardiopulmonary status limiting activity      OT Treatment/Interventions: Self-care/ADL training;Therapeutic exercise;Energy conservation;DME and/or AE instruction;Therapeutic activities;Patient/family education;Balance training    OT Goals(Current goals can be found in the care plan section) Acute Rehab OT Goals Patient Stated Goal: get some help at home OT Goal Formulation: With patient Time For Goal Achievement: 02/08/17 Potential to Achieve Goals: Good ADL Goals Pt Will Perform Grooming: with modified independence;standing Pt Will Perform Lower Body Dressing: with modified independence;sit to/from stand Pt Will Transfer to Toilet: with modified independence;ambulating;bedside commode Pt Will Perform Toileting - Clothing Manipulation and hygiene: with modified independence;sit to/from stand Additional ADL Goal #1: Pt will verbalize 3 strategies to conserve energy during daily ADL routine.  OT Frequency: Min 2X/week   Barriers to D/C:            Co-evaluation              AM-PAC PT "6 Clicks" Daily Activity     Outcome Measure Help from another person eating meals?:  A Little Help from another person taking care of personal grooming?: A Little Help from another person toileting, which includes using toliet, bedpan, or urinal?: A Little Help from another person bathing (including washing, rinsing, drying)?: A Little Help from another person to put on and taking off regular upper body clothing?: A Little Help from another person to put on and taking off regular lower body clothing?: A Little 6 Click Score: 18   End of Session Equipment Utilized During Treatment: Gait belt;Oxygen Nurse Communication: Mobility status  Activity Tolerance: Patient tolerated treatment well Patient left: in chair;with call bell/phone within reach  OT Visit Diagnosis: Other abnormalities of gait and mobility (R26.89);Muscle weakness (generalized) (M62.81)                Time: 5621-30860958-1025 OT Time Calculation (min): 27 min Charges:  OT General Charges $OT Visit: 1 Visit OT Evaluation $OT Eval Moderate Complexity: 1 Mod OT Treatments $Self Care/Home Management : 8-22 mins G-Codes:     Doristine Sectionharity A Chari Parmenter, MS OTR/L  Pager: [phone]   Aleisa Howk A Leisl Spurrier 01/25/2017, 1:13 PM

## 2017-01-26 DIAGNOSIS — R0902 Hypoxemia: Secondary | ICD-10-CM

## 2017-01-26 LAB — CBC
HCT: 41 % (ref 39.0–52.0)
Hemoglobin: 13.4 g/dL (ref 13.0–17.0)
MCH: 30.5 pg (ref 26.0–34.0)
MCHC: 32.7 g/dL (ref 30.0–36.0)
MCV: 93.2 fL (ref 78.0–100.0)
Platelets: 380 10*3/uL (ref 150–400)
RBC: 4.4 MIL/uL (ref 4.22–5.81)
RDW: 13.6 % (ref 11.5–15.5)
WBC: 11.1 10*3/uL — ABNORMAL HIGH (ref 4.0–10.5)

## 2017-01-26 LAB — BASIC METABOLIC PANEL
Anion gap: 12 (ref 5–15)
BUN: 11 mg/dL (ref 6–20)
CO2: 27 mmol/L (ref 22–32)
Calcium: 8.7 mg/dL — ABNORMAL LOW (ref 8.9–10.3)
Chloride: 95 mmol/L — ABNORMAL LOW (ref 101–111)
Creatinine, Ser: 0.89 mg/dL (ref 0.61–1.24)
GFR calc Af Amer: >60 mL/min (ref >60)
GFR calc non Af Amer: >60 mL/min (ref >60)
Glucose, Bld: 115 mg/dL — ABNORMAL HIGH (ref 65–99)
Potassium: 4.1 mmol/L (ref 3.5–5.1)
Sodium: 134 mmol/L — ABNORMAL LOW (ref 135–145)

## 2017-01-26 MED ORDER — DILTIAZEM HCL 60 MG PO TABS
60.0000 mg | ORAL_TABLET | Freq: Three times a day (TID) | ORAL | Status: DC
  Administered 2017-01-26 – 2017-01-27 (×4): 60 mg via ORAL
  Filled 2017-01-26 (×4): qty 1

## 2017-01-26 MED ORDER — INFLUENZA VAC SPLIT QUAD 0.5 ML IM SUSY: 0.5000 mL | PREFILLED_SYRINGE | INTRAMUSCULAR | Status: DC | PRN

## 2017-01-26 MED ORDER — PREDNISONE 20 MG PO TABS
40.0000 mg | ORAL_TABLET | Freq: Every day | ORAL | Status: DC
  Administered 2017-01-26 – 2017-01-27 (×2): 40 mg via ORAL
  Filled 2017-01-26 (×2): qty 2

## 2017-01-26 MED ORDER — GUAIFENESIN-DM 100-10 MG/5ML PO SYRP
5.0000 mL | ORAL_SOLUTION | ORAL | Status: DC | PRN
  Administered 2017-01-26: 5 mL via ORAL
  Filled 2017-01-26: qty 5

## 2017-01-26 MED ORDER — METOPROLOL SUCCINATE ER 50 MG PO TB24
50.0000 mg | ORAL_TABLET | Freq: Every day | ORAL | Status: DC
  Filled 2017-01-26: qty 1

## 2017-01-26 NOTE — Progress Notes (Signed)
Physical Therapy Treatment Patient Details Name: Johnny Rodriguez MRN: [id number] DOB: [birth year] Today's Date: 01/26/2017    History of Present Illness Pt is a 66 y.o. male with oxygen-dependent COPD, atrial fibrillation, and CAD admitted for pneumonia with COPD exacerbation and Afib with RVR.     PT Comments    Pt currently limited secondary to increased HR and decreased SpO2. During ambulation HR ranged form 125-160bpm. Mobility ended for pt safety. Pt reports "feeling fine" and unable to feel increased HR. On return to room SpO2 at 86%. Cues for pursed lipped breathing. SpO2 increased to 91% on 6L of O2. Pt overall steady with gait w/o AD however may benefit from rollator for energy conservation. Updated equipt reccomendations and discussed with PT. Patient would benefit form continued skilled PT to increase activity tolerance and functional independence. Will continue to follow acutely.   Follow Up Recommendations  SNF;Supervision for mobility/OOB     Equipment Recommendations  Other (comment)(Rollator)    Recommendations for Other Services       Precautions / Restrictions Precautions Precautions: Fall Precaution Comments: Watch HR and O2 sats Restrictions Weight Bearing Restrictions: No    Mobility  Bed Mobility Overal bed mobility: Needs Assistance Bed Mobility: Supine to Sit     Supine to sit: Supervision     General bed mobility comments: Supervision for safety.   Transfers Overall transfer level: Needs assistance Equipment used: None Transfers: Sit to/from Stand Sit to Stand: Supervision         General transfer comment: supervision for safety  Ambulation/Gait Ambulation/Gait assistance: Min guard Ambulation Distance (Feet): 30 Feet Assistive device: None Gait Pattern/deviations: Step-through pattern;Decreased stride length     General Gait Details: Pt with overall steady gait w/o AD. Pt ambulated 8 ft out of room before HR increased to 160 BPM. With  standing rest break HR remained between 130-145 BPM. HR increased again into the 160's on return to room.   Stairs            Wheelchair Mobility    Modified Rankin (Stroke Patients Only)       Balance Overall balance assessment: Needs assistance Sitting-balance support: No upper extremity supported;Feet supported Sitting balance-Leahy Scale: Good     Standing balance support: No upper extremity supported;During functional activity Standing balance-Leahy Scale: Good Standing balance comment: Able to ambulate w/o AD.                             Cognition Arousal/Alertness: Awake/alert Behavior During Therapy: WFL for tasks assessed/performed Overall Cognitive Status: Within Functional Limits for tasks assessed                                        Exercises      General Comments General comments (skin integrity, edema, etc.): HR increased with mobility was a max of 160 BPM. O2 sats decreased to 86%. Mobility ended.      Pertinent Vitals/Pain Pain Assessment: No/denies pain Pain Location: Generalized when coughing    Home Living                      Prior Function            PT Goals (current goals can now be found in the care plan section) Acute Rehab PT Goals Patient Stated Goal: Get stronger PT Goal  Formulation: With patient Time For Goal Achievement: 02/01/17 Potential to Achieve Goals: Good Progress towards PT goals: Progressing toward goals    Frequency    Min 3X/week      PT Plan Equipment recommendations need to be updated    Co-evaluation              AM-PAC PT "6 Clicks" Daily Activity  Outcome Measure  Difficulty turning over in bed (including adjusting bedclothes, sheets and blankets)?: None Difficulty moving from lying on back to sitting on the side of the bed? : None Difficulty sitting down on and standing up from a chair with arms (e.g., wheelchair, bedside commode, etc,.)?: A  Little Help needed moving to and from a bed to chair (including a wheelchair)?: A Little Help needed walking in hospital room?: A Little Help needed climbing 3-5 steps with a railing? : A Little 6 Click Score: 20    End of Session Equipment Utilized During Treatment: Gait belt;Oxygen Activity Tolerance: Treatment limited secondary to medical complications (Comment)(Afib with HR max 160) Patient left: in bed;with call bell/phone within reach;with nursing/sitter in room Nurse Communication: Mobility status(HR changes with mobility) PT Visit Diagnosis: Unsteadiness on feet (R26.81);Other abnormalities of gait and mobility (R26.89);Muscle weakness (generalized) (M62.81)     Time: 7829-5621 PT Time Calculation (min) (ACUTE ONLY): 23 min  Charges:  $Gait Training: 23-37 mins                    G Codes:       Kallie Locks, [place] Pager [phone] Acute Rehab   Sheral Apley 01/26/2017, 2:11 PM

## 2017-01-26 NOTE — Progress Notes (Signed)
Pt refuses BIPAP. Pt on 6lt nasal cannula

## 2017-01-26 NOTE — Clinical Social Work Note (Signed)
Clinical Social Work Assessment  Patient Details  Name: Johnny Rodriguez MRN: [id number] Date of Birth: [birth year]  Date of referral:  01/26/17               Reason for consult:  Discharge Planning, Facility Placement                Permission sought to share information with:  Family Supports Permission granted to share information::     Name::        Agency::     Relationship::     Contact Information:     Housing/Transportation Living arrangements for the past 2 months:  Hotel/Motel Source of Information:  Patient Patient Interpreter Needed:  None Criminal Activity/Legal Involvement Pertinent to Current Situation/Hospitalization:  No - Comment as needed Significant Relationships:  None Lives with:  Self Do you feel safe going back to the place where you live?  Yes Need for family participation in patient care:  Yes (Comment)  Care giving concerns:  No family at bedside. Patient stated he does not have support from family/friends    Social Worker assessment / plan: CSW met patient at bedside to offer support and discharge needs. Patient stated he stays at a motel. Patient stated he does not want to discharge to SNF because he will be unable to afford it and he does not want to stay for 30 days. Patient stated he would prefer to have home health at his motel. CSW will consult with RNCM for patients home health needs.   Employment status:  Disabled (Comment on whether or not currently receiving Disability) Insurance information:  Medicaid In [place] PT Recommendations:  [place] / Referral to community resources:  [place]  Patient/Family's Response to care:  Patient appreciates  CSW role in care  Patient/Family's Understanding of and Emotional Response to Diagnosis, Current Treatment, and Prognosis:  Patient aware of his limitations and is willing to receive home health at [employer]  Emotional Assessment Appearance:  Appears stated  age Attitude/Demeanor/Rapport:  Other Affect (typically observed):  Appropriate Orientation:  Oriented to Situation, Oriented to  Time, Oriented to Place, Oriented to Self Alcohol / Substance use:  Not Applicable Psych involvement (Current and /or in the community):  No (Comment)  Discharge Needs  Concerns to be addressed:  No discharge needs identified Readmission within the last 30 days:  No Current discharge risk:  None Barriers to Discharge:  No Barriers Identified   Wende Neighbors, LCSW 01/26/2017, 2:26 PM

## 2017-01-26 NOTE — Progress Notes (Signed)
   Subjective:  Patient states his breathing has improved today. He has been experiencing nasal congestion and states the 6 liters of oxygen has been irritating his nose and the back of his throat.   Discussed with the patient that PT/OT recommended SNF placement. The patient does not want to go to SNF as he states he cannot afford to pay his motel rent and be in a facility at the same time. He had bad experiences in the past with rehab facilities and is adamant about not returning. He is interested in home health.    Objective:  Vital signs in last 24 hours: Vitals:   01/26/17 0422 01/26/17 0807 01/26/17 0828 01/26/17 1027  BP: 131/77 (!) 134/103    Pulse: (!) 112 (!) 45  94  Resp: (!) 26 (!) 31    Temp: 98.7 F (37.1 C) 98.4 F (36.9 C)    TempSrc: Oral Oral    SpO2: 95% 90% 92%   Weight: 157 lb (71.2 kg)     Height:       General: Sitting in bed, NAD Cardiac: irregular rhythm, No R/M/G appreciated Pulm: mildly dyspneic when talking, poor aeration throughout all lungs fields, mild diffuse wheezing Abd: soft, distended, non tender, BS normal Ext: extremities well perfused, no peripheral edema Neuro: alert and oriented X3, cranial nerves II-XII grossly intact  Assessment/Plan:  Active Problems:   Paroxysmal atrial fibrillation (HCC)   Chronic systolic heart failure (HCC)   Essential hypertension   Chronic respiratory failure with hypoxia (HCC)   Pneumonia  CAP COPD: COPD chronically on 3L [place], presenting with worsening SOB and cough, with possible left middle lobe infiltrate on CXR. Patient saturating in the low 90s on 6 liters of [place]. Currently treating for pneumonia and COPD exacerbation. Day four of antibiotics with some improvement in patient's dyspnea. Has not been able to be titrated off 6 L [place]. Will add prednisone for treatment of COPD exacerbation. PT/OT recommend SNF, but patient does not want to go to a facility.  -Start Prednisone 40 mg -Continue Ceftriaxone and  azithromycin for a total of 5 days treatment -Scheduled duonebs -continue spiriva, dulera, and pro-air -Titrate supplemental oxygen as tolerated towards 3 liters with O2 sat goal between 88-92%   Atrial Fibrillation Patient in a fib with RVR. Rates on tele overnight mainly in 110-130s after stopping diltiazem gtt. Most likely due to pneumonia and COPD exacerbation. Concern for PE low as patient is on Dabigatran for CVA ppx.  Not adequately rate controled on Toprol.  -Start PO Cardizem 60 mg q8 hours  -Continue his metoprolol 50 mg daily -Continue anticoagulation with dabigatran -continue cardiac monitoring  Essential Hypertension Currently normotensive. -Continue losartan 25mg  daily -Metoprolol 50 mg daily  Chronic systolic CHF: Last echo in our system shows normalization of EF to 50-55% in 2008, though notes state EF 40% in 2014. Euvolemic on exam today. -holding lasix for now -Metoprolol 50 mg daily  Dispo: Anticipated discharge in approximately 1-2 day(s).  Toney Rakes, MD 01/26/2017, 10:35 AM Pager: [phone]

## 2017-01-26 NOTE — Progress Notes (Signed)
Notified Dr Alda Ponder on after hours pager that patient is in atrial fibrillation with RVR 120-140 sustained, and that patient appears to be stable. Message accepted.

## 2017-01-27 ENCOUNTER — Other Ambulatory Visit: Payer: Self-pay | Admitting: Pharmacist

## 2017-01-27 DIAGNOSIS — I10 Essential (primary) hypertension: Secondary | ICD-10-CM

## 2017-01-27 DIAGNOSIS — J9621 Acute and chronic respiratory failure with hypoxia: Secondary | ICD-10-CM

## 2017-01-27 DIAGNOSIS — I5022 Chronic systolic (congestive) heart failure: Secondary | ICD-10-CM

## 2017-01-27 DIAGNOSIS — Z79899 Other long term (current) drug therapy: Secondary | ICD-10-CM

## 2017-01-27 DIAGNOSIS — J441 Chronic obstructive pulmonary disease with (acute) exacerbation: Secondary | ICD-10-CM

## 2017-01-27 DIAGNOSIS — I480 Paroxysmal atrial fibrillation: Secondary | ICD-10-CM

## 2017-01-27 MED ORDER — PREDNISONE 20 MG PO TABS: 40.0000 mg | ORAL_TABLET | Freq: Every day | ORAL | 0 refills | Status: DC

## 2017-01-27 MED ORDER — DILTIAZEM HCL ER COATED BEADS 180 MG PO CP24: 180.0000 mg | ORAL_CAPSULE | Freq: Every day | ORAL | 0 refills | Status: DC

## 2017-01-27 MED ORDER — MOMETASONE FURO-FORMOTEROL FUM 200-5 MCG/ACT IN AERO: 2.0000 | INHALATION_SPRAY | Freq: Two times a day (BID) | RESPIRATORY_TRACT | 0 refills | Status: DC

## 2017-01-27 MED ORDER — SPIRONOLACTONE 25 MG PO TABS: 25.0000 mg | ORAL_TABLET | Freq: Every day | ORAL | 0 refills | Status: DC

## 2017-01-27 MED ORDER — METOPROLOL SUCCINATE ER 50 MG PO TB24: 50.0000 mg | ORAL_TABLET | Freq: Every day | ORAL | 0 refills | Status: DC

## 2017-01-27 MED ORDER — FUROSEMIDE 40 MG PO TABS: 40.0000 mg | ORAL_TABLET | Freq: Every day | ORAL | 0 refills | Status: DC

## 2017-01-27 MED ORDER — TIOTROPIUM BROMIDE MONOHYDRATE 18 MCG IN CAPS: 1.0000 | ORAL_CAPSULE | Freq: Every day | RESPIRATORY_TRACT | 0 refills | Status: DC

## 2017-01-27 MED ORDER — LOSARTAN POTASSIUM 25 MG PO TABS: 25.0000 mg | ORAL_TABLET | Freq: Every day | ORAL | 0 refills | Status: DC

## 2017-01-27 MED ORDER — DABIGATRAN ETEXILATE MESYLATE 150 MG PO CAPS: 150.0000 mg | ORAL_CAPSULE | Freq: Two times a day (BID) | ORAL | 0 refills | Status: DC

## 2017-01-27 MED ORDER — DABIGATRAN ETEXILATE MESYLATE 150 MG PO CAPS
150.0000 mg | ORAL_CAPSULE | Freq: Two times a day (BID) | ORAL | 0 refills | Status: DC
Start: 2017-01-27 — End: 2017-04-03

## 2017-01-27 MED FILL — CARTIA XT 180 MG CAPSULE SA: 180 | 30 days supply | Qty: 30 | Fill #0

## 2017-01-27 MED FILL — DULERA 200 MCG/5 MCG INH: 200-5 | 30 days supply | Qty: 13 | Fill #0

## 2017-01-27 MED FILL — PRADAXA 150 MG CAP: 150 | 30 days supply | Qty: 60 | Fill #0

## 2017-01-27 MED FILL — FUROSEMIDE 40 MG TAB: 40 | 30 days supply | Qty: 30 | Fill #0

## 2017-01-27 MED FILL — LOSARTAN POTASSIUM 25 MG TA: 25 | 30 days supply | Qty: 30 | Fill #0

## 2017-01-27 MED FILL — SPIRONOLACTONE 25 MG TABLET: 25 | 30 days supply | Qty: 30 | Fill #0

## 2017-01-27 MED FILL — METOPROLOL SUCC ER 50 MG TA: 50 | 30 days supply | Qty: 30 | Fill #0

## 2017-01-27 NOTE — Progress Notes (Addendum)
   Subjective:  Patient states he feels much better today. He says his breathing has improved and he is ready to be discharged home. He is not quite at his baseline, but is "getting there." His only complaint is nasal irritation from the 6 liters of supplemental oxygen. Denies chest pain or palpitations.   He is amenable to home health nursing, but is still declining SNF placement. He does not want a Palliative Care referral at this time. Patient also stated he would like a new primary care doctor.    Objective:  Vital signs in last 24 hours: Vitals:   01/26/17 2357 01/27/17 0506 01/27/17 0846 01/27/17 0848  BP: 121/88 119/86    Pulse: 69 (!) 45    Resp: (!) 23 (!) 27    Temp: (!) 97.3 F (36.3 C) (!) 97.3 F (36.3 C)    TempSrc: Oral Oral    SpO2: 92% 99% (!) 87% (!) 88%  Weight:  157 lb 3.2 oz (71.3 kg)    Height:       General: Chronically ill appearing, sitting up in bed, NAD Cardiac: regular rate, irregular rhythm, No R/M/G appreciated Pulm: Normal effort, poor aeration throughout all lungs fields, course breath sounds throughout, intermittent expiratory wheezing Abd: soft, distended, non tender, BS normal Ext: extremities well perfused, no peripheral edema Neuro: alert and oriented X3, cranial nerves II-XII grossly intact  Assessment/Plan:  Active Problems:   Paroxysmal atrial fibrillation (HCC)   Chronic systolic heart failure (HCC)   Essential hypertension   Chronic respiratory failure with hypoxia (HCC)   Pneumonia  CAP COPD: Patient's dyspnea improving. Day 5 of ceftriaxone. Day 2 of prednisone. Did well at rest with home O2 rate of 3L. Ambulatory pulse ox on 3L with sats 90-97%, avg 93%. He experienced no dyspnea on exertion. PT/OT recommend SNF, but patient does not want to go to a facility. Care management to help set up home health nursing. Will provide info for new PCP. He is medically stable for discharge today.  -Ambulatory pulse ox -Continue Prednisone 40  mg, will complete 3 more days  -Last dose of Ceftriaxone today for a total of 5 days treatment -Scheduled duonebs -continue spiriva, dulera, and pro-air; will refill at d/c -Will discuss his medications with pharmacy to have them at hand at discharge   Atrial Fibrillation Rate controlled on metoprolol and Cardizem. Rates on tele overnight ranging between 70-90, intermittent PVCs.  -Continue PO Cardizem 60 mg q8 hours , will be d/c on long acting cardizem -Continue metoprolol 50 mg daily -Continue anticoagulation with dabigatran -continue cardiac monitoring  Essential Hypertension Currently normotensive. -Continue losartan 25mg  daily -Metoprolol 50 mg daily  Chronic systolic CHF: Last echo in our system shows normalization of EF to 50-55% in 2008, though notes state EF 40% in 2014. Euvolemic on exam today. -holding lasix and spironolactone for now, will resume at discharge -Metoprolol 50 mg daily  Dispo: Anticipated discharge today.   Toney Rakes, MD 01/27/2017, 8:57 AM Pager: [phone]

## 2017-01-27 NOTE — Care Management Note (Signed)
Case Management Note [name] RN, BSN Unit 4E-Case Manager [phone]  Patient Details  Name: Johnny Rodriguez MRN: [id number] Date of Birth: [birth year]  Subjective/Objective:  Pt admitted with PNA                 Action/Plan: PTA pt staying in motel- PT eval with recommendation for SNF- however pt has declined SNF placement and wants to return to the motel with [facility] services- [facility] order placed - spoke with pt at bedside- choice offered for [facility] agency- per pt he would like to use [facility] - pt already has home 02 (baseline 3L) with [facility]- has portable tank at bedside for d/c- discussed need for RW- pt states he is not sure- order placed- will have [facility] f/u if pt wants RW or not- referral for [facility] services called to [name] with [facility]- CSW to provide cab voucher for transportation needs back to motel.   Expected Discharge Date:  01/27/17               Expected Discharge Plan:  Skilled Nursing Facility  In-House Referral:  Clinical Social Work  Discharge planning Services  CM Consult  Post Acute Care Choice:  Home Health Choice offered to:  Patient  DME Arranged:  [place] rolling DME Agency:  [facility]  HH Arranged:  RN [facility] Agency:  [facility]  Status of Service:  Completed, signed off  If discussed at Long Length of Stay Meetings, dates discussed:    Discharge Disposition: home/home health   Additional Comments:  [name], RN 01/27/2017, 2:30 PM

## 2017-01-27 NOTE — Progress Notes (Signed)
All medications on profile were provided to patient prior to discharge, for a 30-day supply of each, 1-time fill. Medications were reviewed with the patient, including name, instructions, indication, goals of therapy, potential side effects, importance of adherence, and safe use. Patient was advised to follow up within 1-2 weeks post discharge with PCP and referrals.  Patient verbalized understanding by repeating back information. Patient was also provided an information handout.

## 2017-01-27 NOTE — Progress Notes (Signed)
Patient ambulated in the hall for a total of 240 ft on 3 L [place], SpO2 range was 90-97 % with an average of approximately 93 % SpO2.  Patient tolerated well without shortness of breath or dyspnea.

## 2017-01-27 NOTE — Progress Notes (Signed)
Occupational Therapy Treatment Patient Details Name: Johnny Rodriguez MRN: [id number] DOB: 1950/03/09 Today's Date: 01/27/2017    History of present illness Pt is a 66 y.o. male with oxygen-dependent COPD, atrial fibrillation, and CAD admitted for pneumonia with COPD exacerbation and Afib with RVR.    OT comments  Pt is demonstrating progress toward OT goals. He continues to demonstrate significant shortness of breath with activity this session with SpO2 desaturation to 84% with tub shower transfer practice on 3L O2 via [place]. Pt educated on pursed lip breathing techniques with rebound to 88%. He is able to complete toilet transfers with supervision for safety and min guard assist for tub/shower transfers. Pt additionally educated on energy conservation strategies to maximize functional independence and safety with ADL. Continue to feel that pt would benefit from 24 hour assistance and SNF level rehabilitation but he reports strong desire to return home. As such, recommend home health OT follow up and home health aide. Will continue to follow while admitted.    Follow Up Recommendations  Home health OT;Supervision/Assistance - 24 hour    Equipment Recommendations  None recommended by OT    Recommendations for Other Services      Precautions / Restrictions Precautions Precautions: Fall Precaution Comments: Watch HR and O2 sats Restrictions Weight Bearing Restrictions: No       Mobility Bed Mobility Overal bed mobility: Needs Assistance Bed Mobility: Supine to Sit     Supine to sit: Supervision Sit to supine: Supervision   General bed mobility comments: Supervision for safety.   Transfers Overall transfer level: Needs assistance Equipment used: None Transfers: Sit to/from Stand Sit to Stand: Supervision         General transfer comment: supervision for safety    Balance Overall balance assessment: Needs assistance Sitting-balance support: No upper extremity supported;Feet  supported Sitting balance-Leahy Scale: Good     Standing balance support: No upper extremity supported;During functional activity Standing balance-Leahy Scale: Good Standing balance comment: Able to ambulate w/o AD.                            ADL either performed or assessed with clinical judgement   ADL Overall ADL's : Needs assistance/impaired Eating/Feeding: Set up;Sitting   Grooming: Supervision/safety;Standing                   Toilet Transfer: Supervision/safety;Ambulation       Tub/ Shower Transfer: Min guard;Tub transfer;Ambulation   Functional mobility during ADLs: Min guard General ADL Comments: Pt requiring frequent rest breaks.      Vision   Additional Comments: Does not have glasses in hospital room.    Perception     Praxis      Cognition Arousal/Alertness: Awake/alert Behavior During Therapy: WFL for tasks assessed/performed Overall Cognitive Status: Within Functional Limits for tasks assessed                                          Exercises     Shoulder Instructions       General Comments SpO2 down to 84% with tub transfer practice and rebounding to 88% with pursed lip breathing techniques on 3L O2     Pertinent Vitals/ Pain       Pain Assessment: No/denies pain  Home Living  Prior Functioning/Environment              Frequency  Min 2X/week        Progress Toward Goals  OT Goals(current goals can now be found in the care plan section)  Progress towards OT goals: Progressing toward goals  Acute Rehab OT Goals Patient Stated Goal: Get stronger OT Goal Formulation: With patient Time For Goal Achievement: 02/08/17 Potential to Achieve Goals: Good  Plan Discharge plan remains appropriate    Co-evaluation                 AM-PAC PT "6 Clicks" Daily Activity     Outcome Measure   Help from another person eating meals?: A  Little Help from another person taking care of personal grooming?: A Little Help from another person toileting, which includes using toliet, bedpan, or urinal?: A Little Help from another person bathing (including washing, rinsing, drying)?: A Little Help from another person to put on and taking off regular upper body clothing?: A Little Help from another person to put on and taking off regular lower body clothing?: A Little 6 Click Score: 18    End of Session Equipment Utilized During Treatment: Gait belt;Oxygen  OT Visit Diagnosis: Other abnormalities of gait and mobility (R26.89);Muscle weakness (generalized) (M62.81)   Activity Tolerance Patient tolerated treatment well   Patient Left in chair;with call bell/phone within reach   Nurse Communication Mobility status        Time: 0017-4944 OT Time Calculation (min): 22 min  Charges: OT General Charges $OT Visit: 1 Visit OT Treatments $Self Care/Home Management : 8-22 mins  Doristine Section, MS OTR/L  Pager: [phone]    Davette Nugent A Darielle Hancher 01/27/2017, 1:27 PM

## 2017-01-27 NOTE — Discharge Summary (Signed)
Name: Johnny Rodriguez MRN: [id number] DOB: [birth year] 66 y.o. PCP: Johnny Benderean, Eric L, MD  Date of Admission: 01/23/2017  8:22 AM Date of Discharge: 01/27/2017 Attending Physician: No att. providers found  Discharge Diagnosis: 1. COPD exacerbation 2. Community Acquired Pneumonia 3. Atrial Fibrillation   Principal Problem:   Pneumonia Active Problems:   Paroxysmal atrial fibrillation (HCC)   Chronic systolic heart failure (HCC)   COPD exacerbation (HCC)   Essential hypertension   Chronic respiratory failure with hypoxia [facility](HCC)   Discharge Medications: Allergies as of 01/27/2017   No Known Allergies     Medication List    STOP taking these medications   SHINGRIX injection Generic drug:  Zoster Vaccine Adjuvanted   Tiotropium Bromide Monohydrate 2.5 MCG/ACT Aers Commonly known as:  SPIRIVA RESPIMAT Replaced by:  tiotropium 18 MCG inhalation capsule     TAKE these medications   aspirin EC 81 MG tablet Take 81 mg by mouth daily.   dabigatran 150 MG Caps capsule Commonly known as:  PRADAXA Take 1 capsule (150 mg total) by mouth every 12 (twelve) hours. Medicaid [id number] O   diltiazem 180 MG 24 hr capsule Commonly known as:  CARDIZEM CD Take 1 capsule (180 mg total) by mouth daily. Medicaid [id number] O   furosemide 40 MG tablet Commonly known as:  LASIX Take 1 tablet (40 mg total) by mouth daily. Medicaid [id number] O   losartan 25 MG tablet Commonly known as:  COZAAR Take 1 tablet (25 mg total) by mouth daily. Medicaid [id number] O   metoprolol succinate 50 MG 24 hr tablet Commonly known as:  TOPROL-XL Take 1 tablet (50 mg total) by mouth daily. Take with or immediately following a meal. Medicaid [id number] O   mometasone-formoterol 200-5 MCG/ACT Aero Commonly known as:  DULERA Inhale 2 puffs into the lungs 2 (two) times daily. Medicaid [id number] O   OXYGEN 3lpm 24/7  A[facility]   predniSONE 20 MG tablet Commonly known as:  DELTASONE Take 2 tablets (40 mg total) by mouth  daily with breakfast. Medicaid [id number] O   PROAIR HFA 108 (90 Base) MCG/ACT inhaler Generic drug:  albuterol Inhale 1 puff into the lungs every 6 (six) hours as needed for wheezing.   spironolactone 25 MG tablet Commonly known as:  ALDACTONE Take 1 tablet (25 mg total) by mouth daily. Medicaid [id number] O   tiotropium 18 MCG inhalation capsule Commonly known as:  SPIRIVA Place 1 capsule (18 mcg total) into inhaler and inhale daily. Replaces:  Tiotropium Bromide Monohydrate 2.5 MCG/ACT Aers   Vitamin D2 2000 units Tabs Take 1 tablet by mouth daily.       Disposition and follow-up:   JohnnyJohnny Rodriguez was [facility] St[facility].  At the hospital follow up visit please address:  1.   Chronic Obstructive Pulmonary Disease Exacerbation -Completion of 5 days of 40 mg of Prednisone ? Discharged with 3 more days for completion of steroid course -Functional status with ADLs? Is home health nursing beneficial?  -Interest in Palliative Care? Patient declined referral to palliative at time of discharge. -Please assess patients understanding of progressive nature of COPD -Consider Pulmonary Rehab referral  -Is the patient able to obtain all inhaler medications?  Atrial Fibrillation  -Presented in A fib with RVR, most likely due to CAP and COPD exacerbation -Initially placed on diltiazem drip, was able to wean on day 2 of hospitilization -Had difficulty with rate control only on metoprolol 50 mg daily -Added 180 mg of Cardizem daily, was discharged in rate  controlled a fib -Able to obtain all medications? Cardizem, Metoprolol, Pradaxa?  Essential Hypertension -Blood pressure was stable during hospitalization -Normotensive of Metoprolol and Losartan, no changes made to BP medication regimen  Chronic Systolic Heart Failure -Last echo in our system shows normalization of EF to 50-55% in 2008, though notes state EF 40% in 2014. -Patient was  euvolumic during hospitalization, and showed no signs of symptoms of heart failure   2.  Labs / imaging needed at time of follow-up: None  3.  Pending labs/ test needing follow-up: None  Follow-up Appointments: Follow-up Information    Johnny Bender, MD Follow up in 1 week(s).   Specialty:  Internal Medicine Why:  Please follow up with your PCP in 1 week.  Contact information: [address] [place] [address] [phone]        [facility]-Home Follow up.   Specialty:  Home Health Services Why:  HHRN arranged- they will call you to set up home visits Contact information: [address] [place] [address] [phone]           Hospital Course by problem list: Principal Problem:   Pneumonia Active Problems:   Paroxysmal atrial fibrillation (HCC)   Chronic systolic heart failure (HCC)   COPD exacerbation (HCC)   Essential hypertension   Chronic respiratory failure with hypoxia (HCC)   1. COPD Exacerbation, Community Acquired Pneumonia  Johnny Rodriguez was admitted to [facility] and the Internal Medicine Teaching service for acute on chronic hypoxic respiratory failure, COPD exacerbation, and community acquired pneumonia. Upon initial presentation to the emergency department, the patient was afebrile and normotensive, but was tachycardic in atrial fibrillation with RVR, tachypnic on BiPAP with subsequent toleration of 6 liters of oxygen on nasal canula. He had a possible early pneumonia on chest xray and was started on empiric antibiotics (azithromycin and ceftriaxone) in the ED. He was also given IV magnesium and started on breathing treatments. Prednisone was held for several days due to concern for pneumonia. The patient was treated with full course of antibiotics, azithromycin x three days and ceftriaxone x five days. He was discharged with 3 more days of prednisone for a total of five days. He was successfully weaned to his home  oxygen rate of 3 liters. Ambulatory oxygen saturation was ~93% at discharge on 3 liters.   The patient's functional status is very pooy and ADLs are very limited due to dyspnea on exertion. He stated it would take him almost an hour to get dressed. He also was nonadherent with his inhalers and medications. The option of a palliative care was discussed with the patient. It seems like he does not have a good understanding of the progressive nature of his chronic illness. He declined a palliative care referral at discharge. I would recommend readdressing this topic at follow up.    Atrial Fibrillation  Patient presented in atrial fibrillation with RVR, heart rates into the 160s. He was started on a diltiazem drip. He was weaned off the drip and restarted on his home dose of metoprolol 50 mg daily. This did not adequately control his heart rates and PO Cardizem 180 mg was added. He remained in atrial fibrillation, but achieved rate control. Pradaxa was resumed during hospitalization. Metoprolol 50 mg, Cardizem 108 mg, and Pradaxa were continued at discharge.   Chronic Systolic Heart Failure No signs or symptoms of heart failure during this hospitalization. Patient takes 40 mg lasix and 25 mg of spironolactone at  home. These medications waere held during hospitalization as he was euvolemic on exam. Lasix and spironolactone were resumed at discharge.   Essential Hypertension Blood pressure was stable during hospitalization. Mr. Retana remained normotensive on Metoprolol and Losartan, no changes made to BP medication regimen.  For chronic medical conditions mentioned above, the patient was provided with all medications at discharge via "Meds to [facility]" per pharmacy.  Discharge Vitals:   BP (!) 138/126   Pulse 76   Temp 98.6 F (37 C) (Oral)   Resp (!) 25   Ht 5\' 11"  (1.803 m)   Wt 157 lb 3.2 oz (71.3 kg)   SpO2 (!) 89%   BMI 21.92 kg/m   Pertinent Labs, Studies, and Procedures:  CBC Latest Ref Rng  & Units 01/26/2017 01/25/2017 01/24/2017  WBC 4.0 - 10.5 K/uL 11.1(H) 11.9(H) 12.5(H)  Hemoglobin 13.0 - 17.0 g/dL 16.1 11.9(Rodriguez) 11.8(Rodriguez)  Hematocrit 39.0 - 52.0 % 41.0 36.9(Rodriguez) 36.6(Rodriguez)  Platelets 150 - 400 K/uL 380 328 308   BMP Latest Ref Rng & Units 01/26/2017 01/25/2017 01/24/2017  Glucose 65 - 99 mg/dL 096(E) 454(U) 981(X)  BUN 6 - 20 mg/dL 11 13 16   Creatinine 0.61 - 1.24 mg/dL 9.14 7.82 9.56  Sodium 135 - 145 mmol/Rodriguez 134(Rodriguez) 136 136  Potassium 3.5 - 5.1 mmol/Rodriguez 4.1 3.6 3.5  Chloride 101 - 111 mmol/Rodriguez 95(Rodriguez) 95(Rodriguez) 90(Rodriguez)  CO2 22 - 32 mmol/Rodriguez 27 33(H) 36(H)  Calcium 8.9 - 10.3 mg/dL 2.1(H) 0.8(M) 5.7(Q)    Discharge Instructions: Discharge Instructions    Diet - low sodium heart healthy   Complete by:  As directed    Discharge instructions   Complete by:  As directed    Mr. Darnelle, Corp were admitted for pneumonia and a COPD exacerbation. Your breathing and oxygen levels have returned to baseline after treatment. Please take 40 mg of Prednisone (TWO 20 mg tablets daily with breakfast) for 3 more days.   I have added a medication to your treatment regimen. This medicine is called Diltiazem or Cardizem. It is used to slow down your heart rate. Please take one 180 mg tablet of Cardizem daily. You can continue all other medications as you were before.   A referral for home health has been placed. I have sent all of your prescriptions to your pharmacy.   It is very important you follow up with a primary care doctor after discharge. Below are a few options for primary care providers in [place]. The office addresses and phone numbers are below. Please see a primary care doctor within 1 week of discharge from the hospital. The medicines I prescribed for you will only last for 1 month. I have given you no refills.   [facility] [address] [place], [place] [address]  Main #: [phone]  [facility] [address]  [place], [place] [address]  Main #: [phone]   Increase activity slowly   Complete by:  As directed       Signed: Toney Rakes, MD 01/29/2017, 1:25 PM   Pager: [phone]

## 2017-01-28 LAB — CULTURE, BLOOD (ROUTINE X 2)
Culture: NO GROWTH
Culture: NO GROWTH
Special Requests: ADEQUATE
Special Requests: ADEQUATE

## 2017-03-17 ENCOUNTER — Telehealth: Payer: Self-pay | Admitting: Internal Medicine

## 2017-03-17 NOTE — Telephone Encounter (Signed)
Patient calling, states that he needs Dr. Ladona Ridgel to sign off on his oxygen so that his insurance will cover it. Patient would like to see if this is possible.

## 2017-03-20 NOTE — Telephone Encounter (Signed)
Returned call to Pt.  Notified that Pt should seek oxygen DME refill from his pulmonologist or primary cards Dr. Donnie Aho.  Pt states he has upcoming appt with Dr. Donnie Aho.  Pt will ask Dr. Donnie Aho to [facility] for oxygen.  Notified Pt if pulmonology and Dr. Donnie Aho refuse he can call back.  Pt indicates understanding.

## 2017-03-24 ENCOUNTER — Ambulatory Visit: Payer: Medicaid Other | Admitting: Internal Medicine

## 2017-03-29 ENCOUNTER — Other Ambulatory Visit: Payer: Self-pay

## 2017-03-29 ENCOUNTER — Emergency Department (HOSPITAL_COMMUNITY): Payer: Medicare Other

## 2017-03-29 ENCOUNTER — Encounter (HOSPITAL_COMMUNITY): Payer: Self-pay | Admitting: Emergency Medicine

## 2017-03-29 ENCOUNTER — Inpatient Hospital Stay (HOSPITAL_COMMUNITY)
Admission: EM | Admit: 2017-03-29 | Discharge: 2017-04-03 | DRG: 190 | Disposition: A | Discharge status: Home Health | Payer: Medicare Other | Attending: Internal Medicine | Admitting: Internal Medicine

## 2017-03-29 DIAGNOSIS — I420 Dilated cardiomyopathy: Secondary | ICD-10-CM | POA: Diagnosis present

## 2017-03-29 DIAGNOSIS — Z7902 Long term (current) use of antithrombotics/antiplatelets: Secondary | ICD-10-CM | POA: Diagnosis not present

## 2017-03-29 DIAGNOSIS — R0602 Shortness of breath: Secondary | ICD-10-CM | POA: Diagnosis present

## 2017-03-29 DIAGNOSIS — E785 Hyperlipidemia, unspecified: Secondary | ICD-10-CM | POA: Diagnosis present

## 2017-03-29 DIAGNOSIS — Z7982 Long term (current) use of aspirin: Secondary | ICD-10-CM

## 2017-03-29 DIAGNOSIS — I110 Hypertensive heart disease with heart failure: Secondary | ICD-10-CM | POA: Diagnosis present

## 2017-03-29 DIAGNOSIS — F1010 Alcohol abuse, uncomplicated: Secondary | ICD-10-CM | POA: Diagnosis present

## 2017-03-29 DIAGNOSIS — I5032 Chronic diastolic (congestive) heart failure: Secondary | ICD-10-CM | POA: Diagnosis present

## 2017-03-29 DIAGNOSIS — Z9119 Patient's noncompliance with other medical treatment and regimen: Secondary | ICD-10-CM

## 2017-03-29 DIAGNOSIS — Z66 Do not resuscitate: Secondary | ICD-10-CM | POA: Diagnosis present

## 2017-03-29 DIAGNOSIS — Z9114 Patient's other noncompliance with medication regimen: Secondary | ICD-10-CM

## 2017-03-29 DIAGNOSIS — J9601 Acute respiratory failure with hypoxia: Secondary | ICD-10-CM | POA: Diagnosis present

## 2017-03-29 DIAGNOSIS — I481 Persistent atrial fibrillation: Secondary | ICD-10-CM | POA: Diagnosis present

## 2017-03-29 DIAGNOSIS — Z9981 Dependence on supplemental oxygen: Secondary | ICD-10-CM | POA: Diagnosis not present

## 2017-03-29 DIAGNOSIS — I480 Paroxysmal atrial fibrillation: Secondary | ICD-10-CM | POA: Diagnosis present

## 2017-03-29 DIAGNOSIS — I10 Essential (primary) hypertension: Secondary | ICD-10-CM

## 2017-03-29 DIAGNOSIS — A419 Sepsis, unspecified organism: Secondary | ICD-10-CM

## 2017-03-29 DIAGNOSIS — J441 Chronic obstructive pulmonary disease with (acute) exacerbation: Secondary | ICD-10-CM | POA: Diagnosis present

## 2017-03-29 DIAGNOSIS — I5022 Chronic systolic (congestive) heart failure: Secondary | ICD-10-CM

## 2017-03-29 DIAGNOSIS — E872 Acidosis: Secondary | ICD-10-CM | POA: Diagnosis present

## 2017-03-29 DIAGNOSIS — I444 Left anterior fascicular block: Secondary | ICD-10-CM | POA: Diagnosis present

## 2017-03-29 DIAGNOSIS — I471 Supraventricular tachycardia: Secondary | ICD-10-CM | POA: Diagnosis present

## 2017-03-29 DIAGNOSIS — J9600 Acute respiratory failure, unspecified whether with hypoxia or hypercapnia: Secondary | ICD-10-CM

## 2017-03-29 DIAGNOSIS — F17210 Nicotine dependence, cigarettes, uncomplicated: Secondary | ICD-10-CM | POA: Diagnosis present

## 2017-03-29 DIAGNOSIS — Z7951 Long term (current) use of inhaled steroids: Secondary | ICD-10-CM

## 2017-03-29 DIAGNOSIS — I2510 Atherosclerotic heart disease of native coronary artery without angina pectoris: Secondary | ICD-10-CM | POA: Diagnosis present

## 2017-03-29 DIAGNOSIS — Z825 Family history of asthma and other chronic lower respiratory diseases: Secondary | ICD-10-CM

## 2017-03-29 DIAGNOSIS — F40240 Claustrophobia: Secondary | ICD-10-CM | POA: Diagnosis present

## 2017-03-29 DIAGNOSIS — I482 Chronic atrial fibrillation: Secondary | ICD-10-CM | POA: Diagnosis present

## 2017-03-29 DIAGNOSIS — Z79899 Other long term (current) drug therapy: Secondary | ICD-10-CM

## 2017-03-29 DIAGNOSIS — Z7952 Long term (current) use of systemic steroids: Secondary | ICD-10-CM

## 2017-03-29 DIAGNOSIS — J9602 Acute respiratory failure with hypercapnia: Secondary | ICD-10-CM | POA: Diagnosis present

## 2017-03-29 DIAGNOSIS — J9690 Respiratory failure, unspecified, unspecified whether with hypoxia or hypercapnia: Secondary | ICD-10-CM

## 2017-03-29 LAB — I-STAT ARTERIAL BLOOD GAS, ED
Acid-base deficit: 2 mmol/L (ref 0.0–2.0)
Acid-base deficit: 3 mmol/L — ABNORMAL HIGH (ref 0.0–2.0)
Acid-base deficit: 4 mmol/L — ABNORMAL HIGH (ref 0.0–2.0)
Bicarbonate: 25 mmol/L (ref 20.0–28.0)
Bicarbonate: 24.3 mmol/L (ref 20.0–28.0)
Bicarbonate: 24.1 mmol/L (ref 20.0–28.0)
O2 Saturation: 100 %
O2 Saturation: 100 %
O2 Saturation: 89 %
Patient temperature: 97.2
Patient temperature: 97.2
Patient temperature: 100.1
TCO2: 26 mmol/L (ref 22–32)
TCO2: 26 mmol/L (ref 22–32)
TCO2: 26 mmol/L (ref 22–32)
pCO2 arterial: 48.1 mmHg — ABNORMAL HIGH (ref 32.0–48.0)
pCO2 arterial: 51.1 mmHg — ABNORMAL HIGH (ref 32.0–48.0)
pCO2 arterial: 60.8 mmHg — ABNORMAL HIGH (ref 32.0–48.0)
pH, Arterial: 7.319 — ABNORMAL LOW (ref 7.350–7.450)
pH, Arterial: 7.28 — ABNORMAL LOW (ref 7.350–7.450)
pH, Arterial: 7.211 — ABNORMAL LOW (ref 7.350–7.450)
pO2, Arterial: 188 mmHg — ABNORMAL HIGH (ref 83.0–108.0)
pO2, Arterial: 270 mmHg — ABNORMAL HIGH (ref 83.0–108.0)
pO2, Arterial: 72 mmHg — ABNORMAL LOW (ref 83.0–108.0)

## 2017-03-29 LAB — CBC WITH DIFFERENTIAL/PLATELET
Basophils Absolute: 0 10*3/uL (ref 0.0–0.1)
Basophils Relative: 0 %
Eosinophils Absolute: 0 10*3/uL (ref 0.0–0.7)
Eosinophils Relative: 0 %
HCT: 43.6 % (ref 39.0–52.0)
Hemoglobin: 14.9 g/dL (ref 13.0–17.0)
Lymphocytes Relative: 17 %
Lymphs Abs: 2.1 10*3/uL (ref 0.7–4.0)
MCH: 32.3 pg (ref 26.0–34.0)
MCHC: 34.2 g/dL (ref 30.0–36.0)
MCV: 94.4 fL (ref 78.0–100.0)
Monocytes Absolute: 1.5 10*3/uL — ABNORMAL HIGH (ref 0.1–1.0)
Monocytes Relative: 12 %
Neutro Abs: 8.6 10*3/uL — ABNORMAL HIGH (ref 1.7–7.7)
Neutrophils Relative %: 71 %
Platelets: 234 10*3/uL (ref 150–400)
RBC: 4.62 MIL/uL (ref 4.22–5.81)
RDW: 14.7 % (ref 11.5–15.5)
WBC: 12.3 10*3/uL — ABNORMAL HIGH (ref 4.0–10.5)

## 2017-03-29 LAB — COMPREHENSIVE METABOLIC PANEL
ALT: 12 U/L — ABNORMAL LOW (ref 17–63)
AST: 36 U/L (ref 15–41)
Albumin: 3.7 g/dL (ref 3.5–5.0)
Alkaline Phosphatase: 67 U/L (ref 38–126)
Anion gap: 16 — ABNORMAL HIGH (ref 5–15)
BUN: 14 mg/dL (ref 6–20)
CO2: 21 mmol/L — ABNORMAL LOW (ref 22–32)
Calcium: 9.2 mg/dL (ref 8.9–10.3)
Chloride: 99 mmol/L — ABNORMAL LOW (ref 101–111)
Creatinine, Ser: 1.18 mg/dL (ref 0.61–1.24)
GFR calc Af Amer: >60 mL/min (ref >60)
GFR calc non Af Amer: >60 mL/min (ref >60)
Glucose, Bld: 152 mg/dL — ABNORMAL HIGH (ref 65–99)
Potassium: 4 mmol/L (ref 3.5–5.1)
Sodium: 136 mmol/L (ref 135–145)
Total Bilirubin: 1.7 mg/dL — ABNORMAL HIGH (ref 0.3–1.2)
Total Protein: 7.6 g/dL (ref 6.5–8.1)

## 2017-03-29 LAB — CREATININE, SERUM
Creatinine, Ser: 1.19 mg/dL (ref 0.61–1.24)
GFR calc Af Amer: >60 mL/min (ref >60)
GFR calc non Af Amer: >60 mL/min (ref >60)

## 2017-03-29 LAB — LACTIC ACID, PLASMA
Lactic Acid, Venous: 2.3 mmol/L (ref 0.5–1.9)
Lactic Acid, Venous: 3.7 mmol/L (ref 0.5–1.9)

## 2017-03-29 LAB — CBC
HCT: 38.8 % — ABNORMAL LOW (ref 39.0–52.0)
Hemoglobin: 12.8 g/dL — ABNORMAL LOW (ref 13.0–17.0)
MCH: 31.3 pg (ref 26.0–34.0)
MCHC: 33 g/dL (ref 30.0–36.0)
MCV: 94.9 fL (ref 78.0–100.0)
Platelets: 201 10*3/uL (ref 150–400)
RBC: 4.09 MIL/uL — ABNORMAL LOW (ref 4.22–5.81)
RDW: 14.8 % (ref 11.5–15.5)
WBC: 8 10*3/uL (ref 4.0–10.5)

## 2017-03-29 LAB — MRSA PCR SCREENING: MRSA by PCR: NEGATIVE

## 2017-03-29 LAB — POCT I-STAT 3, VENOUS BLOOD GAS (G3P V)
Acid-base deficit: 4 mmol/L — ABNORMAL HIGH (ref 0.0–2.0)
Bicarbonate: 23.8 mmol/L (ref 20.0–28.0)
O2 Saturation: 52 %
Patient temperature: 97.6
TCO2: 25 mmol/L (ref 22–32)
pCO2, Ven: 56.2 mmHg (ref 44.0–60.0)
pH, Ven: 7.231 — ABNORMAL LOW (ref 7.250–7.430)
pO2, Ven: 32 mmHg (ref 32.0–45.0)

## 2017-03-29 LAB — I-STAT CG4 LACTIC ACID, ED: Lactic Acid, Venous: 3.82 mmol/L (ref 0.5–1.9)

## 2017-03-29 LAB — I-STAT TROPONIN, ED: Troponin i, poc: 0 ng/mL (ref 0.00–0.08)

## 2017-03-29 LAB — INFLUENZA PANEL BY PCR (TYPE A & B)
Influenza A By PCR: NEGATIVE
Influenza B By PCR: NEGATIVE

## 2017-03-29 MED ORDER — SODIUM CHLORIDE 0.9 % IV BOLUS (SEPSIS)
500.0000 mL | Freq: Once | INTRAVENOUS | Status: AC
  Administered 2017-03-29: 500 mL via INTRAVENOUS

## 2017-03-29 MED ORDER — SODIUM CHLORIDE 0.9 % IV BOLUS (SEPSIS)
1000.0000 mL | Freq: Once | INTRAVENOUS | Status: AC
  Administered 2017-03-29: 1000 mL via INTRAVENOUS

## 2017-03-29 MED ORDER — CEFTRIAXONE SODIUM 1 G IJ SOLR
1.0000 g | INTRAMUSCULAR | Status: DC
  Administered 2017-03-29 – 2017-04-01 (×4): 1 g via INTRAVENOUS
  Filled 2017-03-29 (×5): qty 10

## 2017-03-29 MED ORDER — FAMOTIDINE 20 MG IN NS 100 ML IVPB
20.0000 mg | Freq: Two times a day (BID) | INTRAVENOUS | Status: DC
  Administered 2017-03-29 – 2017-03-31 (×4): 20 mg via INTRAVENOUS
  Filled 2017-03-29 (×5): qty 100

## 2017-03-29 MED ORDER — ONDANSETRON HCL 4 MG/2ML IJ SOLN
4.0000 mg | Freq: Once | INTRAMUSCULAR | Status: AC
  Administered 2017-03-29: 4 mg via INTRAVENOUS

## 2017-03-29 MED ORDER — VANCOMYCIN HCL IN DEXTROSE 1-5 GM/200ML-% IV SOLN
1000.0000 mg | Freq: Once | INTRAVENOUS | Status: AC
  Administered 2017-03-29: 1000 mg via INTRAVENOUS
  Filled 2017-03-29: qty 200

## 2017-03-29 MED ORDER — ONDANSETRON HCL 4 MG/2ML IJ SOLN: 4.0000 mg | Freq: Four times a day (QID) | INTRAMUSCULAR | Status: DC | PRN

## 2017-03-29 MED ORDER — ENOXAPARIN SODIUM 40 MG/0.4ML ~~LOC~~ SOLN
40.0000 mg | SUBCUTANEOUS | Status: DC
  Administered 2017-03-29 – 2017-03-30 (×2): 40 mg via SUBCUTANEOUS
  Filled 2017-03-29 (×2): qty 0.4

## 2017-03-29 MED ORDER — LACTATED RINGERS IV SOLN
INTRAVENOUS | Status: DC
  Administered 2017-03-29 – 2017-03-30 (×3): via INTRAVENOUS

## 2017-03-29 MED ORDER — SODIUM CHLORIDE 0.9 % IV SOLN: 250.0000 mL | INTRAVENOUS | Status: DC | PRN

## 2017-03-29 MED ORDER — SODIUM CHLORIDE 0.9 % IV BOLUS (SEPSIS)
250.0000 mL | Freq: Once | INTRAVENOUS | Status: AC
  Administered 2017-03-29: 250 mL via INTRAVENOUS

## 2017-03-29 MED ORDER — DILTIAZEM HCL-DEXTROSE 100-5 MG/100ML-% IV SOLN (PREMIX)
5.0000 mg/h | INTRAVENOUS | Status: DC
  Administered 2017-03-29: 5 mg/h via INTRAVENOUS
  Administered 2017-03-29: 15 mg/h via INTRAVENOUS
  Filled 2017-03-29 (×2): qty 100

## 2017-03-29 MED ORDER — LORAZEPAM 2 MG/ML IJ SOLN
2.0000 mg | Freq: Once | INTRAMUSCULAR | Status: AC
  Administered 2017-03-29: 2 mg via INTRAVENOUS

## 2017-03-29 MED ORDER — METHYLPREDNISOLONE SODIUM SUCC 40 MG IJ SOLR
40.0000 mg | Freq: Four times a day (QID) | INTRAMUSCULAR | Status: DC
  Administered 2017-03-29 – 2017-03-31 (×10): 40 mg via INTRAVENOUS
  Filled 2017-03-29 (×10): qty 1

## 2017-03-29 MED ORDER — ORAL CARE MOUTH RINSE
15.0000 mL | Freq: Two times a day (BID) | OROMUCOSAL | Status: DC
  Administered 2017-03-29 – 2017-04-02 (×8): 15 mL via OROMUCOSAL

## 2017-03-29 MED ORDER — MORPHINE SULFATE (PF) 4 MG/ML IV SOLN
4.0000 mg | Freq: Once | INTRAVENOUS | Status: AC
  Administered 2017-03-29: 4 mg via INTRAVENOUS

## 2017-03-29 MED ORDER — ACETAMINOPHEN 325 MG PO TABS: 650.0000 mg | ORAL_TABLET | ORAL | Status: DC | PRN

## 2017-03-29 MED ORDER — ASPIRIN 300 MG RE SUPP
300.0000 mg | RECTAL | Status: AC
  Administered 2017-03-29: 300 mg via RECTAL
  Filled 2017-03-29: qty 1

## 2017-03-29 MED ORDER — IPRATROPIUM-ALBUTEROL 0.5-2.5 (3) MG/3ML IN SOLN
3.0000 mL | RESPIRATORY_TRACT | Status: DC
  Administered 2017-03-29: 3 mL via RESPIRATORY_TRACT
  Filled 2017-03-29: qty 3

## 2017-03-29 MED ORDER — PIPERACILLIN-TAZOBACTAM 3.375 G IVPB 30 MIN
3.3750 g | Freq: Once | INTRAVENOUS | Status: AC
  Administered 2017-03-29: 3.375 g via INTRAVENOUS
  Filled 2017-03-29: qty 50

## 2017-03-29 MED ORDER — IPRATROPIUM-ALBUTEROL 0.5-2.5 (3) MG/3ML IN SOLN: 3.0000 mL | RESPIRATORY_TRACT | Status: DC | PRN

## 2017-03-29 MED ORDER — IPRATROPIUM-ALBUTEROL 0.5-2.5 (3) MG/3ML IN SOLN
3.0000 mL | Freq: Three times a day (TID) | RESPIRATORY_TRACT | Status: DC
  Administered 2017-03-29 – 2017-03-31 (×7): 3 mL via RESPIRATORY_TRACT
  Filled 2017-03-29 (×7): qty 3

## 2017-03-29 MED ORDER — ALBUTEROL (5 MG/ML) CONTINUOUS INHALATION SOLN
10.0000 mg/h | INHALATION_SOLUTION | Freq: Once | RESPIRATORY_TRACT | Status: AC
  Administered 2017-03-29: 10 mg/h via RESPIRATORY_TRACT
  Filled 2017-03-29: qty 20

## 2017-03-29 MED ORDER — SODIUM CHLORIDE 0.9 % IV SOLN: 1.0000 g | INTRAVENOUS | Status: DC

## 2017-03-29 MED ORDER — IBUPROFEN 200 MG PO TABS
400.0000 mg | ORAL_TABLET | Freq: Once | ORAL | Status: DC
  Filled 2017-03-29: qty 2

## 2017-03-29 MED ORDER — ASPIRIN 81 MG PO CHEW: 324.0000 mg | CHEWABLE_TABLET | ORAL | Status: AC

## 2017-03-29 MED ORDER — DILTIAZEM LOAD VIA INFUSION
20.0000 mg | Freq: Once | INTRAVENOUS | Status: AC
  Administered 2017-03-29: 20 mg via INTRAVENOUS
  Filled 2017-03-29: qty 20

## 2017-03-29 NOTE — ED Notes (Signed)
Will RT states he paused the albuterol due to HR

## 2017-03-29 NOTE — Progress Notes (Signed)
ABG was ordered for patient however venous sample was obtained.  Results called to MD.  Instructed to increase IPAP to 18 and EPAP to 8.     Ref. Range 03/29/2017 16:04  Sample type Unknown VENOUS  pH, Ven Latest Ref Range: 7.250 - 7.430  7.231 (L)  pCO2, Ven Latest Ref Range: 44.0 - 60.0 mmHg 56.2  pO2, Ven Latest Ref Range: 32.0 - 45.0 mmHg 32.0  TCO2 Latest Ref Range: 22 - 32 mmol/L 25  Acid-base deficit Latest Ref Range: 0.0 - 2.0 mmol/L 4.0 (H)  Bicarbonate Latest Ref Range: 20.0 - 28.0 mmol/L 23.8  O2 Saturation Latest Units: % 52.0  Patient temperature Unknown 97.6 F  Collection site Unknown RADIAL, ALLEN'S T.Marland KitchenMarland Kitchen

## 2017-03-29 NOTE — Progress Notes (Signed)
Pt transported to ICU on BiPAP.  No complications noted.

## 2017-03-29 NOTE — Progress Notes (Signed)
RT had to stop pt.breathing tx. Due to pt. HR elevating. MD made aware. Pt. Is currently tolerating the bipap at this time.

## 2017-03-29 NOTE — Progress Notes (Signed)
eLink Physician-Brief Progress Note Patient Name: LUMUMBA LOERTSCHER DOB: 01/16/1951 MRN: [id number]   Date of Service  03/29/2017  HPI/Events of Note  Patient c/o pain - From fall a year ago? Creatinine = 1.19.   eICU Interventions  Will order: 1. Motrin 400 mg X 1 PO now.      Intervention Category Intermediate Interventions: Pain - evaluation and management  Sommer,Steven Eugene 03/29/2017, 9:42 PM

## 2017-03-29 NOTE — H&P (Signed)
PULMONARY / CRITICAL CARE MEDICINE   Name: Johnny Rodriguez MRN: [id number] DOB: 11-Sep-1950    ADMISSION DATE:  03/29/2017 CONSULTATION DATE:  03/29/17  REFERRING MD:  ED Physician  CHIEF COMPLAINT:  Shortness of Breath  HISTORY OF PRESENT ILLNESS:   This is a 67yo man who presents with worsening short of breath. This has develloped over the past 48 hours. The patient has longstanding COPD. He has additional history of cardimyopathy, CHF. The patient has a dry , non-productive cough. No known fever. It appears that the patient was in a SVT when he arrived. The patient has a previous DNR/DNI order that we will honor. PAST MEDICAL HISTORY :  He  has a past medical history of A-fib (HCC), AF (atrial fibrillation) (HCC), Alcohol abuse, Angina pectoris, Arrhythmia, CAD (coronary artery disease) (12/15/2004), Cardiomyopathy, idiopathic (HCC), CHF (congestive heart failure) (HCC), COPD (chronic obstructive pulmonary disease) (HCC), Coronary artery disease, Heavy cigarette smoker, HTN (hypertension), Idiopathic cardiomyopathy (HCC) (07/16/2009), Non compliance with medical treatment, and Seizures (HCC).  PAST SURGICAL HISTORY: He  has a past surgical history that includes Cardiac catheterization (2006); No past surgeries; Cardioversion (01/21/2011); fx collar bone; Cardiac catheterization (12/2004); doppler echocardiography (02/2011); electrophysiology study (N/A, 12/16/2011); and Cardiac catheterization (N/A, 08/24/2015).  No Known Allergies  No current facility-administered medications on file prior to encounter.    Current Outpatient Medications on File Prior to Encounter  Medication Sig  . aspirin EC 81 MG tablet Take 81 mg by mouth daily.  . dabigatran (PRADAXA) 150 MG CAPS capsule Take 1 capsule (150 mg total) by mouth every 12 (twelve) hours. Medicaid [id number] O  . diltiazem (CARDIZEM CD) 180 MG 24 hr capsule Take 1 capsule (180 mg total) by mouth daily. Medicaid [id number] O  .  Ergocalciferol (VITAMIN D2) 2000 units TABS Take 1 tablet by mouth daily.  . furosemide (LASIX) 40 MG tablet Take 1 tablet (40 mg total) by mouth daily. Medicaid [id number] O  . losartan (COZAAR) 25 MG tablet Take 1 tablet (25 mg total) by mouth daily. Medicaid [id number] O  . metoprolol succinate (TOPROL-XL) 50 MG 24 hr tablet Take 1 tablet (50 mg total) by mouth daily. Take with or immediately following a meal. Medicaid [id number] O  . mometasone-formoterol (DULERA) 200-5 MCG/ACT AERO Inhale 2 puffs into the lungs 2 (two) times daily. Medicaid [id number] O  . OXYGEN 3lpm 24/7  [facility]  . predniSONE (DELTASONE) 20 MG tablet Take 2 tablets (40 mg total) by mouth daily with breakfast. Medicaid [id number] O  . PROAIR HFA 108 (90 Base) MCG/ACT inhaler Inhale 1 puff into the lungs every 6 (six) hours as needed for wheezing.   Marland Kitchen spironolactone (ALDACTONE) 25 MG tablet Take 1 tablet (25 mg total) by mouth daily. Medicaid [id number] O  . tiotropium (SPIRIVA) 18 MCG inhalation capsule Place 1 capsule (18 mcg total) into inhaler and inhale daily.    FAMILY HISTORY:  His indicated that his mother is deceased. He indicated that his father is deceased. He indicated that two of his three sisters are alive. He indicated that his brother is alive.   SOCIAL HISTORY: He  reports that he has been smoking.  He has a 116.00 pack-year smoking history. he has never used smokeless tobacco. He reports that he drinks alcohol. He reports that he does not use drugs.  REVIEW OF SYSTEMS:   Review of Systems  Constitutional: Negative for chills and fever.  HENT: Negative for sinus pain.   Respiratory: Positive for cough, shortness of breath and wheezing. Negative for hemoptysis, sputum production and  stridor.   Cardiovascular: Negative for chest pain.  Gastrointestinal: Negative for heartburn and nausea.  Genitourinary: Negative for dysuria.  Musculoskeletal: Negative for myalgias.  Neurological: Negative for dizziness.   Endo/Heme/Allergies: Does not bruise/bleed easily.  Psychiatric/Behavioral: Negative for depression.       VITAL SIGNS: BP 105/84   Pulse 89   Temp 100.1 F (37.8 C) (Axillary)   Resp (!) 25   Ht 5\' 11"  (1.803 m)   Wt 155 lb (70.3 kg)   SpO2 98%   BMI 21.62 kg/m        VENTILATOR SETTINGS: FiO2 (%):  [80 %] 80 %  INTAKE / OUTPUT: I/O last 3 completed shifts: In: 1500 [IV Piggyback:1500] Out: -   PHYSICAL EXAMINATION: General:  Patient is short of breath, fatigued , on BIPAP Neuro:  Awake and responsive. Will respond appropriatelt HEENT:  [place]/AT Cardiovascular: RRRs1S2 Lungs:  Bilateral rhonchi, hacking cough Abdomen: soft, BS, non-tender Extr:: no edema Skin:  wnl  LABS:  BMET Recent Labs  Lab 03/29/17 0300  NA 136  K 4.0  CL 99*  CO2 21*  BUN 14  CREATININE 1.18  GLUCOSE 152*    Electrolytes Recent Labs  Lab 03/29/17 0300  CALCIUM 9.2    CBC Recent Labs  Lab 03/29/17 0300  WBC 12.3*  HGB 14.9  HCT 43.6  PLT 234    Coag's No results for input(s): APTT, INR in the last 168 hours.  Sepsis Markers Recent Labs  Lab 03/29/17 0331 03/29/17 0436  LATICACIDVEN 3.82* 2.3*    ABG Recent Labs  Lab 03/29/17 0338 03/29/17 0524  PHART 7.319* 7.280*  PCO2ART 48.1* 51.1*  PO2ART 188.0* 270.0*    Liver Enzymes Recent Labs  Lab 03/29/17 0300  AST 36  ALT 12*  ALKPHOS 67  BILITOT 1.7*  ALBUMIN 3.7    Cardiac Enzymes No results for input(s): TROPONINI, PROBNP in the last 168 hours.  Glucose No results for input(s): GLUCAP in the last 168 hours.  Imaging Dg Chest Port 1 View  Result Date: 03/29/2017 CLINICAL DATA:  Shortness of breath. EXAM: PORTABLE CHEST 1 VIEW COMPARISON:  01/23/2017 FINDINGS: Mild chronic hyperinflation. Increased peribronchial thickening from prior exam. Heart is normal in size. Aortic arch atherosclerosis. No confluent consolidation. No pleural effusion or pneumothorax. No acute osseous abnormalities.  IMPRESSION: Chronic hyperinflation. Increased bronchial thickening from prior exam suspicious for COPD exacerbation/acute bronchitis. Electronically Signed   By: Rubye Oaks M.D.   On: 03/29/2017 03:50    ASSESSMENT / PLAN: The patient presents wiuth an apparent exacerbation of COPD. Was last her in 12/18. He is awake but sleepy. He is on BIPAP   PULMONARY  Will continue BIPAP. Last ABG shows mild acute resp. Acidosis. He ahs been placed on steroids , HHN and empiric abx at this time.' At risk for ventialtory failure. No gross evidence of pneumonia radiographically. Flu screen negative. Previous PFT shows a FEV1 of only 28% suggesting severe COPD. ABGs stable last few hours.  CARDIOVASCULAR S/p SVT. He appears hemodynamically stable at this time.    Jamesetta So MD Pulmonary and Critical Care Medicine [facility] Pager: [phone]  03/29/2017, 8:08 AM

## 2017-03-29 NOTE — ED Triage Notes (Addendum)
Pt arrived [facility] from [facility] with c/o SOB. Prior to EMS arrival pt states that he took all 3 of his inhalers with no relief. Pt given 2g Mag, 125 solumedrol, 1 atrovent, 10 albuterol., .3 epi  Pt refused CPAP prior to arrival. Pt placed on BIPAP upon arrival to ED.

## 2017-03-29 NOTE — ED Provider Notes (Addendum)
[facility] EMERGENCY DEPARTMENT Provider Note   CSN: [id number] Arrival date & time: 03/29/17  0249     History   Chief Complaint Chief Complaint  Patient presents with  . Shortness of Breath    HPI Johnny Rodriguez is a 67 y.o. male.  67 year old male.  History of A. fib, coronary artery disease, cardia myopathy, CHF, COPD.  Describes progressive shortness of breath over the last 48 hours.  Uncertain of fever.  States chest pain "when I cough".  Dry nonproductive cough.  HPI history limited due to patient's arrival.  Is undergoing nebulized albuterol and has declined and refused BiPAP by paramedics due to claustrophobia.  Past Medical History:  Diagnosis Date  . A-fib (HCC)   . AF (atrial fibrillation) (HCC)   . Alcohol abuse   . Angina pectoris   . Arrhythmia   . CAD (coronary artery disease) 12/15/2004  . Cardiomyopathy, idiopathic (HCC)   . CHF (congestive heart failure) (HCC)   . COPD (chronic obstructive pulmonary disease) (HCC)   . Coronary artery disease   . Heavy cigarette smoker   . HTN (hypertension)   . Idiopathic cardiomyopathy (HCC) 07/16/2009  . Non compliance with medical treatment   . Seizures [facility])     Patient Active Problem List   Diagnosis Date Noted  . Pneumonia 01/23/2017  . Chronic respiratory failure with hypoxia (HCC) 10/15/2016  . Cigarette smoker 09/09/2016  . Long term current use of anticoagulant therapy 06/24/2016  . History of syncope 06/24/2016  . CAP (community acquired pneumonia) 02/15/2016  . COPD exacerbation (HCC) 02/08/2015  . Essential hypertension 02/08/2015  . Syncope 12/10/2011  . History of noncompliance with medical treatment 12/01/2011  . Chronic systolic heart failure (HCC) 04/10/2011  . Idiopathic cardiomyopathy (HCC) 07/16/2009  . Paroxysmal atrial fibrillation (HCC) 07/16/2009  . COPD PFT's pending  07/16/2009  . CAD (coronary artery disease) 12/15/2004    Past Surgical History:  Procedure  Laterality Date  . CARDIAC CATHETERIZATION  2006  . CARDIAC CATHETERIZATION  12/2004   Left   . CARDIOVERSION  01/21/2011   Procedure: CARDIOVERSION;  Surgeon: Darden Palmer., MD;  Location: [facility] OR;  Service: Cardiovascular;  Laterality: N/A;  . DOPPLER ECHOCARDIOGRAPHY  02/2011  . ELECTROPHYSIOLOGY STUDY N/A 12/16/2011   Procedure: ELECTROPHYSIOLOGY STUDY;  Surgeon: Marinus Maw, MD;  Location: [facility] CATH LAB;  Service: Cardiovascular;  Laterality: N/A;  . EP IMPLANTABLE DEVICE N/A 08/24/2015   Procedure: Loop Recorder Removal;  Surgeon: Marinus Maw, MD;  Location: [facility] INVASIVE CV LAB;  Service: Cardiovascular;  Laterality: N/A;  . fx collar bone    . NO PAST SURGERIES         Home Medications    Prior to Admission medications   Medication Sig Start Date End Date Taking? Authorizing Provider  aspirin EC 81 MG tablet Take 81 mg by mouth daily.    [provider]  dabigatran (PRADAXA) 150 MG CAPS capsule Take 1 capsule (150 mg total) by mouth every 12 (twelve) hours. Medicaid [id number] O 01/27/17   Lacroce, Ames Coupe, MD  diltiazem (CARDIZEM CD) 180 MG 24 hr capsule Take 1 capsule (180 mg total) by mouth daily. Medicaid [id number] O 01/27/17 02/26/17  Toney Rakes, MD  Ergocalciferol (VITAMIN D2) 2000 units TABS Take 1 tablet by mouth daily.    [provider]  furosemide (LASIX) 40 MG tablet Take 1 tablet (40 mg total) by mouth daily. Medicaid [id number] O 01/27/17 02/26/17  Toney Rakes,  MD  losartan (COZAAR) 25 MG tablet Take 1 tablet (25 mg total) by mouth daily. Medicaid [id number] O 01/27/17 02/26/17  Lacroce, Ames Coupe, MD  metoprolol succinate (TOPROL-XL) 50 MG 24 hr tablet Take 1 tablet (50 mg total) by mouth daily. Take with or immediately following a meal. Medicaid [id number] O 01/28/17   Lacroce, Ames Coupe, MD  mometasone-formoterol (DULERA) 200-5 MCG/ACT AERO Inhale 2 puffs into the lungs 2 (two) times daily. Medicaid [id number] O 01/27/17   Lacroce,  Ames Coupe, MD  OXYGEN 3lpm 24/7  [facility]    [provider]  predniSONE (DELTASONE) 20 MG tablet Take 2 tablets (40 mg total) by mouth daily with breakfast. Medicaid [id number] O 01/28/17   Lacroce, Ames Coupe, MD  PROAIR HFA 108 (90 Base) MCG/ACT inhaler Inhale 1 puff into the lungs every 6 (six) hours as needed for wheezing.  01/09/15   [provider]  spironolactone (ALDACTONE) 25 MG tablet Take 1 tablet (25 mg total) by mouth daily. Medicaid [id number] O 01/27/17 02/26/17  Lacroce, Ames Coupe, MD  tiotropium (SPIRIVA) 18 MCG inhalation capsule Place 1 capsule (18 mcg total) into inhaler and inhale daily. 01/28/17   Toney Rakes, MD    Family History Family History  Problem Relation Age of Onset  . Suicidality Sister   . CVA Mother   . Stroke Mother   . Heart disease Father   . Coronary artery disease Father   . COPD Father     Social History Social History   Tobacco Use  . Smoking status: Current Every Day Smoker    Packs/day: 2.00    Years: 58.00    Pack years: 116.00  . Smokeless tobacco: Never Used  . Tobacco comment: pt states he is down to 1ppd  Substance Use Topics  . Alcohol use: Yes    Alcohol/week: 0.0 oz    Comment: EtOH use diorder - liquor  . Drug use: No     Allergies   Patient has no known allergies.   Review of Systems Review of Systems  Unable to perform ROS: Acuity of condition  Patient unable to provide history as he is dyspneic upon arrival.  Ultimately he is able to answer simple questioning.  No headache.  States she has had a sore throat and a dry cough.  Chest pain only with cough.  Feeling palpitations.  Some abdominal pain.  No vomiting.  No extremity swelling.  Physical Exam Updated Vital Signs BP 106/65   Pulse 83   Temp 100.1 F (37.8 C) (Axillary)   Resp (!) 25   SpO2 99%   Physical Exam  Constitutional: He appears distressed.  Thin 67 year old male.  Awake and alert.  Distress due to dyspnea.  HENT:    Mouth/Throat: Oropharynx is clear and moist. No oropharyngeal exudate.  Conjunctive are not pale.  No pharyngeal erythema.  No adenopathy in the neck.  No JVD.  Eyes:  Conjunctive not pale.  Neck: No JVD present.  Cardiovascular:  A. fib with rapid ventricular response on the monitor.  Rate 200.  Pulmonary/Chest: Accessory muscle usage present. Tachypnea noted. He is in respiratory distress. He has decreased breath sounds. He has wheezes.  Globally diminished and prolonged breath sounds with wheezing in all fields.  Abdominal:  Soft benign abdomen  Musculoskeletal: Normal range of motion.       Right lower leg: He exhibits no edema.       Left lower leg: He exhibits no edema.  Neurological: He is alert.  Skin: Skin  is warm and dry. Capillary refill takes less than 2 seconds.  Psychiatric: His mood appears anxious.     ED Treatments / Results  Labs (all labs ordered are listed, but only abnormal results are displayed) Labs Reviewed  CBC WITH DIFFERENTIAL/PLATELET - Abnormal; Notable for the following components:      Result Value   WBC 12.3 (*)    Neutro Abs 8.6 (*)    Monocytes Absolute 1.5 (*)    All other components within normal limits  COMPREHENSIVE METABOLIC PANEL - Abnormal; Notable for the following components:   Chloride 99 (*)    CO2 21 (*)    Glucose, Bld 152 (*)    ALT 12 (*)    Total Bilirubin 1.7 (*)    Anion gap 16 (*)    All other components within normal limits  LACTIC ACID, PLASMA - Abnormal; Notable for the following components:   Lactic Acid, Venous 2.3 (*)    All other components within normal limits  I-STAT CG4 LACTIC ACID, ED - Abnormal; Notable for the following components:   Lactic Acid, Venous 3.82 (*)    All other components within normal limits  I-STAT ARTERIAL BLOOD GAS, ED - Abnormal; Notable for the following components:   pH, Arterial 7.319 (*)    pCO2 arterial 48.1 (*)    pO2, Arterial 188.0 (*)    All other components within normal  limits  I-STAT ARTERIAL BLOOD GAS, ED - Abnormal; Notable for the following components:   pH, Arterial 7.280 (*)    pCO2 arterial 51.1 (*)    pO2, Arterial 270.0 (*)    Acid-base deficit 3.0 (*)    All other components within normal limits  CULTURE, BLOOD (ROUTINE X 2)  CULTURE, BLOOD (ROUTINE X 2)  INFLUENZA PANEL BY PCR (TYPE A & B)  BLOOD GAS, ARTERIAL  I-STAT TROPONIN, ED    EKG  EKG Interpretation None       Radiology Dg Chest Port 1 View  Result Date: 03/29/2017 CLINICAL DATA:  Shortness of breath. EXAM: PORTABLE CHEST 1 VIEW COMPARISON:  01/23/2017 FINDINGS: Mild chronic hyperinflation. Increased peribronchial thickening from prior exam. Heart is normal in size. Aortic arch atherosclerosis. No confluent consolidation. No pleural effusion or pneumothorax. No acute osseous abnormalities. IMPRESSION: Chronic hyperinflation. Increased bronchial thickening from prior exam suspicious for COPD exacerbation/acute bronchitis. Electronically Signed   By: Rubye Oaks M.D.   On: 03/29/2017 03:50    Procedures Procedures (including critical care time)  Medications Ordered in ED Medications  diltiazem (CARDIZEM) 1 mg/mL load via infusion 20 mg (20 mg Intravenous Bolus from Bag 03/29/17 0351)    And  diltiazem (CARDIZEM) 100 mg in dextrose 5% (1 mg/mL) infusion (10 mg/hr Intravenous Rate/Dose Change 03/29/17 0454)  sodium chloride 0.9 % bolus 1,000 mL (not administered)    And  sodium chloride 0.9 % bolus 1,000 mL (1,000 mLs Intravenous New Bag/Given 03/29/17 0449)    And  sodium chloride 0.9 % bolus 250 mL (not administered)  vancomycin (VANCOCIN) IVPB 1000 mg/200 mL premix (1,000 mg Intravenous New Bag/Given 03/29/17 0451)  albuterol (PROVENTIL,VENTOLIN) solution continuous neb (10 mg/hr Nebulization Given 03/29/17 0307)  ondansetron (ZOFRAN) injection 4 mg (4 mg Intravenous Given 03/29/17 0259)  morphine 4 MG/ML injection 4 mg (4 mg Intravenous Given 03/29/17 0259)    LORazepam (ATIVAN) injection 2 mg (2 mg Intravenous Given 03/29/17 0259)  piperacillin-tazobactam (ZOSYN) IVPB 3.375 g (3.375 g Intravenous New Bag/Given 03/29/17 0450)     Initial  Impression / Assessment and Plan / ED Course  I have reviewed the triage vital signs and the nursing notes.  Pertinent labs & imaging results that were available during my care of the patient were reviewed by me and considered in my medical decision making (see chart for details).    EKG Interpretation  Date/Time:  Sunday March 29 2017 03:22:48 EST Ventricular Rate:  205 PR Interval:    QRS Duration: 86 QT Interval:  257 QTC Calculation: 489 R Axis:   -70 Text Interpretation:  Supraventricular tachycardia Left anterior fascicular block Nonspecific T abnormalities, lateral leads Baseline wander in lead(s) II Confirmed by Rolland Porter ([id number]) on 03/29/2017 6:17:57 AM   Patient placed on BiPAP.  This required significant coaching.  Was given morphine, Ativan, Zofran.  He was able to tolerate this and has improved.  Was given continuous albuterol, IV solu medrol.  Multiple re-evaluations show him to be improving.  He is less dyspneic and tolerating BiPAP.  Temperature is 100.1 axillary.  Was not able to have oral temp and refused rectal temperature.  Blood cultures obtained.  Chest x-ray shows bronchitis and COPD changes.  No infiltrate or failure.  Initial ABG 7.30 PCO2 49.8 PO2 192.  Recheck shows 7.27 PCO2 of 52 PO2 274.  Lactate was elevated initially.  Was given sepsis fluids.  Given vancomycin and Zosyn antibiotics.  Started on Cardizem with bolus and infusion.  Heart rate has improved but remains in A. fib at 140.  He is dyspneic.  His lung exam has improved.  However he still is in mild distress.  He is not becoming somnolent or fatigued.  He does not require imminent intubation and mechanical ventilation.  We will discuss him with intensivist as a feel he would require close monitoring in ICU setting  should he fail and require mechanical ventilation.  CRITICAL CARE Performed by: Johnny Rodriguez   Total critical care time: 87 minutes  Critical care time was exclusive of separately billable procedures and treating other patients.  Critical care was necessary to treat or prevent imminent or life-threatening deterioration.  Critical care was time spent personally by me on the following activities: development of treatment plan with patient and/or surrogate as well as nursing, discussions with consultants, evaluation of patient's response to treatment, examination of patient, obtaining history from patient or surrogate, ordering and performing treatments and interventions, ordering and review of laboratory studies, ordering and review of radiographic studies, pulse oximetry and re-evaluation of patient's condition.   Final Clinical Impressions(s) / ED Diagnoses   Final diagnoses:  COPD exacerbation (HCC)  Paroxysmal atrial fibrillation (HCC)  Sepsis, due to unspecified organism [facility])    ED Discharge Orders    None       Rolland Porter, MD 03/29/17 6045    Rolland Porter, MD 03/29/17 [phone]

## 2017-03-29 NOTE — Progress Notes (Signed)
CRITICAL VALUE ALERT  Critical Value: Lactic acid 3.7  Date & Time Notied:  03/29/2017 1230  Provider Notified: Dr.Rosenblatt  Orders Received/Actions taken: NS 500 ml bolus X 1 then LR 125 ml/hr continuous fluids

## 2017-03-30 DIAGNOSIS — J441 Chronic obstructive pulmonary disease with (acute) exacerbation: Secondary | ICD-10-CM | POA: Diagnosis not present

## 2017-03-30 LAB — BASIC METABOLIC PANEL
Anion gap: 10 (ref 5–15)
BUN: 15 mg/dL (ref 6–20)
CO2: 24 mmol/L (ref 22–32)
Calcium: 8.7 mg/dL — ABNORMAL LOW (ref 8.9–10.3)
Chloride: 106 mmol/L (ref 101–111)
Creatinine, Ser: 0.83 mg/dL (ref 0.61–1.24)
GFR calc Af Amer: >60 mL/min (ref >60)
GFR calc non Af Amer: >60 mL/min (ref >60)
Glucose, Bld: 167 mg/dL — ABNORMAL HIGH (ref 65–99)
Potassium: 4.2 mmol/L (ref 3.5–5.1)
Sodium: 140 mmol/L (ref 135–145)

## 2017-03-30 LAB — MAGNESIUM: Magnesium: 2.3 mg/dL (ref 1.7–2.4)

## 2017-03-30 LAB — CBC WITH DIFFERENTIAL/PLATELET
Basophils Absolute: 0 10*3/uL (ref 0.0–0.1)
Basophils Relative: 0 %
Eosinophils Absolute: 0 10*3/uL (ref 0.0–0.7)
Eosinophils Relative: 0 %
HCT: 34 % — ABNORMAL LOW (ref 39.0–52.0)
Hemoglobin: 11 g/dL — ABNORMAL LOW (ref 13.0–17.0)
Lymphocytes Relative: 5 %
Lymphs Abs: 0.4 10*3/uL — ABNORMAL LOW (ref 0.7–4.0)
MCH: 30.7 pg (ref 26.0–34.0)
MCHC: 32.4 g/dL (ref 30.0–36.0)
MCV: 95 fL (ref 78.0–100.0)
Monocytes Absolute: 0.5 10*3/uL (ref 0.1–1.0)
Monocytes Relative: 7 %
Neutro Abs: 6.7 10*3/uL (ref 1.7–7.7)
Neutrophils Relative %: 88 %
Platelets: 187 10*3/uL (ref 150–400)
RBC: 3.58 MIL/uL — ABNORMAL LOW (ref 4.22–5.81)
RDW: 14.9 % (ref 11.5–15.5)
WBC: 7.6 10*3/uL (ref 4.0–10.5)

## 2017-03-30 LAB — HEPARIN LEVEL (UNFRACTIONATED): Heparin Unfractionated: 0.17 IU/mL — ABNORMAL LOW (ref 0.30–0.70)

## 2017-03-30 LAB — PHOSPHORUS: Phosphorus: 2.5 mg/dL (ref 2.5–4.6)

## 2017-03-30 LAB — LACTIC ACID, PLASMA: Lactic Acid, Venous: 1.3 mmol/L (ref 0.5–1.9)

## 2017-03-30 LAB — TROPONIN I: Troponin I: 0.03 ng/mL (ref <0.03)

## 2017-03-30 MED ORDER — DILTIAZEM HCL-DEXTROSE 100-5 MG/100ML-% IV SOLN (PREMIX)
5.0000 mg/h | INTRAVENOUS | Status: DC
  Administered 2017-03-30: 15 mg/h via INTRAVENOUS
  Administered 2017-03-30: 5 mg/h via INTRAVENOUS
  Administered 2017-03-31: 10 mg/h via INTRAVENOUS
  Administered 2017-03-31: 5 mg/h via INTRAVENOUS
  Filled 2017-03-30 (×3): qty 100

## 2017-03-30 MED ORDER — HEPARIN (PORCINE) IN NACL 100-0.45 UNIT/ML-% IJ SOLN
1400.0000 [IU]/h | INTRAMUSCULAR | Status: AC
  Administered 2017-03-30: 1050 [IU]/h via INTRAVENOUS
  Administered 2017-03-31: 1200 [IU]/h via INTRAVENOUS
  Administered 2017-04-01: 1400 [IU]/h via INTRAVENOUS
  Filled 2017-03-30 (×4): qty 250

## 2017-03-30 MED ORDER — METOPROLOL SUCCINATE ER 50 MG PO TB24
50.0000 mg | ORAL_TABLET | Freq: Every day | ORAL | Status: DC
  Administered 2017-03-30 – 2017-04-03 (×5): 50 mg via ORAL
  Filled 2017-03-30 (×5): qty 1

## 2017-03-30 MED ORDER — ADULT MULTIVITAMIN W/MINERALS CH
1.0000 | ORAL_TABLET | Freq: Every day | ORAL | Status: DC
  Administered 2017-03-30 – 2017-04-03 (×5): 1 via ORAL
  Filled 2017-03-30 (×5): qty 1

## 2017-03-30 MED ORDER — HEPARIN BOLUS VIA INFUSION
1800.0000 [IU] | Freq: Once | INTRAVENOUS | Status: AC
  Administered 2017-03-30: 1800 [IU] via INTRAVENOUS
  Filled 2017-03-30: qty 1800

## 2017-03-30 MED ORDER — FUROSEMIDE 40 MG PO TABS
40.0000 mg | ORAL_TABLET | Freq: Every day | ORAL | Status: DC
  Administered 2017-03-30 – 2017-04-03 (×5): 40 mg via ORAL
  Filled 2017-03-30 (×5): qty 1

## 2017-03-30 MED ORDER — METOPROLOL TARTRATE 5 MG/5ML IV SOLN
INTRAVENOUS | Status: AC
  Filled 2017-03-30: qty 5

## 2017-03-30 MED ORDER — METOPROLOL TARTRATE 5 MG/5ML IV SOLN
5.0000 mg | Freq: Once | INTRAVENOUS | Status: AC
  Administered 2017-03-30: 5 mg via INTRAVENOUS

## 2017-03-30 MED ORDER — METOPROLOL TARTRATE 5 MG/5ML IV SOLN: 2.5000 mg | INTRAVENOUS | Status: DC | PRN

## 2017-03-30 MED ORDER — FOLIC ACID 1 MG PO TABS
1.0000 mg | ORAL_TABLET | Freq: Every day | ORAL | Status: DC
  Administered 2017-03-30 – 2017-04-03 (×5): 1 mg via ORAL
  Filled 2017-03-30 (×5): qty 1

## 2017-03-30 MED ORDER — VITAMIN B-1 100 MG PO TABS
100.0000 mg | ORAL_TABLET | Freq: Every day | ORAL | Status: DC
  Administered 2017-03-30 – 2017-04-03 (×5): 100 mg via ORAL
  Filled 2017-03-30 (×5): qty 1

## 2017-03-30 MED ORDER — DEXMEDETOMIDINE HCL IN NACL 200 MCG/50ML IV SOLN
0.2000 ug/kg/h | INTRAVENOUS | Status: DC
  Administered 2017-03-30 (×2): 0.2 ug/kg/h via INTRAVENOUS
  Filled 2017-03-30: qty 50

## 2017-03-30 MED ORDER — DILTIAZEM HCL ER COATED BEADS 180 MG PO CP24
180.0000 mg | ORAL_CAPSULE | Freq: Every day | ORAL | Status: DC
  Administered 2017-03-30: 180 mg via ORAL
  Filled 2017-03-30 (×2): qty 1

## 2017-03-30 MED ORDER — SODIUM CHLORIDE 0.9 % IV SOLN
500.0000 mg | INTRAVENOUS | Status: DC
  Administered 2017-03-30 – 2017-04-02 (×4): 500 mg via INTRAVENOUS
  Filled 2017-03-30 (×5): qty 500

## 2017-03-30 MED ORDER — LOSARTAN POTASSIUM 25 MG PO TABS
25.0000 mg | ORAL_TABLET | Freq: Every day | ORAL | Status: DC
  Administered 2017-03-30 – 2017-04-03 (×5): 25 mg via ORAL
  Filled 2017-03-30 (×5): qty 1

## 2017-03-30 NOTE — Progress Notes (Signed)
[facility]  Patient Status: Active (receiving services up to time of hospitalization)  [facility] is providing the following services: RN and MSW  If patient discharges after hours, please call [phone].   Johnny Rodriguez 03/30/2017, 11:43 AM

## 2017-03-30 NOTE — Progress Notes (Signed)
Pt noted to be pulling off BP cuff and refusing to wear bipap mask. Dr. Dellie Catholic aware and new orders received and noted. RN will continue to monitor.

## 2017-03-30 NOTE — Progress Notes (Signed)
eLink Physician-Brief Progress Note Patient Name: Johnny Rodriguez DOB: 1951/02/10 MRN: [id number]   Date of Service  03/30/2017  HPI/Events of Note  Patient uncooperative and refusing to wear his BiPAP. H&P does not reflect a significant ETOH abuse history, however, bedside nurse states that she suppects that he has a significant ETOH history. Not on CIWA.  eICU Interventions  Will order: 1. ICU CIWA protocol with low dose Precedex IV infusion.      Intervention Category Major Interventions: Delirium, psychosis, severe agitation - evaluation and management  Jadia Capers Eugene 03/30/2017, 2:02 AM

## 2017-03-30 NOTE — Progress Notes (Signed)
PULMONARY / CRITICAL CARE MEDICINE   Name: Johnny Rodriguez MRN: [id number] DOB: Jun 26, 1950    ADMISSION DATE:  03/29/2017 CONSULTATION DATE:  03/29/17  REFERRING MD:  ED Physician  CHIEF COMPLAINT:  Shortness of Breath  HISTORY OF PRESENT ILLNESS:   67 year old with history of atrial fibrillation, alcohol abuse, coronary artery disease, CHF, severe COPD admitted with dyspnea, COPD exacerbation, atrial fibrillation.  Admitted to the ICU for BiPAP.  He has refused BiPAP overnight Elevated lactate initially.  Given fluids, vent, Zosyn.  Started on Cardizem for SVT He was briefly on Precedex overnight. Marland Kitchen PAST MEDICAL HISTORY :  He  has a past medical history of A-fib (HCC), AF (atrial fibrillation) (HCC), Alcohol abuse, Angina pectoris, Arrhythmia, CAD (coronary artery disease) (12/15/2004), Cardiomyopathy, idiopathic (HCC), CHF (congestive heart failure) (HCC), COPD (chronic obstructive pulmonary disease) (HCC), Coronary artery disease, Heavy cigarette smoker, HTN (hypertension), Idiopathic cardiomyopathy (HCC) (07/16/2009), Non compliance with medical treatment, and Seizures (HCC).  PAST SURGICAL HISTORY: He  has a past surgical history that includes Cardiac catheterization (2006); No past surgeries; Cardioversion (01/21/2011); fx collar bone; Cardiac catheterization (12/2004); doppler echocardiography (02/2011); electrophysiology study (N/A, 12/16/2011); and Cardiac catheterization (N/A, 08/24/2015).  No Known Allergies  No current facility-administered medications on file prior to encounter.    Current Outpatient Medications on File Prior to Encounter  Medication Sig  . dabigatran (PRADAXA) 150 MG CAPS capsule Take 1 capsule (150 mg total) by mouth every 12 (twelve) hours. Medicaid [id number] O  . diltiazem (CARDIZEM CD) 180 MG 24 hr capsule Take 1 capsule (180 mg total) by mouth daily. Medicaid [id number] O  . furosemide (LASIX) 40 MG tablet Take 1 tablet (40 mg total) by mouth daily. Medicaid  [id number] O  . losartan (COZAAR) 25 MG tablet Take 1 tablet (25 mg total) by mouth daily. Medicaid [id number] O  . metoprolol succinate (TOPROL-XL) 50 MG 24 hr tablet Take 1 tablet (50 mg total) by mouth daily. Take with or immediately following a meal. Medicaid [id number] O  . mometasone-formoterol (DULERA) 200-5 MCG/ACT AERO Inhale 2 puffs into the lungs 2 (two) times daily. Medicaid [id number] O  . spironolactone (ALDACTONE) 25 MG tablet Take 1 tablet (25 mg total) by mouth daily. Medicaid [id number] O  . tiotropium (SPIRIVA) 18 MCG inhalation capsule Place 1 capsule (18 mcg total) into inhaler and inhale daily.  Marland Kitchen aspirin EC 81 MG tablet Take 81 mg by mouth daily.  . Ergocalciferol (VITAMIN D2) 2000 units TABS Take 1 tablet by mouth daily.  . OXYGEN 3lpm 24/7  [facility]  . PROAIR HFA 108 (90 Base) MCG/ACT inhaler Inhale 1 puff into the lungs every 6 (six) hours as needed for wheezing.     FAMILY HISTORY:  His indicated that his mother is deceased. He indicated that his father is deceased. He indicated that two of his three sisters are alive. He indicated that his brother is alive.   SOCIAL HISTORY: He  reports that he has been smoking.  He has a 116.00 pack-year smoking history. he has never used smokeless tobacco. He reports that he drinks alcohol. He reports that he does not use drugs.  REVIEW OF SYSTEMS:       VITAL SIGNS: BP 107/81   Pulse (!) 104   Temp (!) 96.1 F (35.6 C) (Oral)   Resp 19   Ht 5\' 11"  (1.803 m)   Wt 164 lb 10.9 oz (74.7 kg)   SpO2 92%   BMI 22.97 kg/m        VENTILATOR SETTINGS: FiO2 (%):  [40 %-  50 %] 50 %  INTAKE / OUTPUT: I/O last 3 completed shifts: In: 4157.1 [I.V.:1457.1; IV Piggyback:2700] Out: 600 [Urine:600]  PHYSICAL EXAMINATION: Blood pressure 107/81, pulse (!) 104, temperature (!) 96.1 F (35.6 C), temperature source Oral, resp. rate 19, height 5\' 11"  (1.803 m), weight 164 lb 10.9 oz (74.7 kg), SpO2 92 %. Gen:      No acute distress HEENT:   EOMI, sclera anicteric Neck:     No masses; no thyromegaly Lungs:    Clear to auscultation bilaterally; normal respiratory effort CV:         Regular rate and rhythm; no murmurs Abd:      + bowel sounds; soft, non-tender; no palpable masses, no distension Ext:    No edema; adequate peripheral perfusion Skin:      Warm and dry; no rash Neuro: alert and oriented x 3 Psych: normal mood and affect  LABS:  BMET Recent Labs  Lab 03/29/17 0300 03/29/17 1036 03/30/17 0249  NA 136  --  140  K 4.0  --  4.2  CL 99*  --  106  CO2 21*  --  24  BUN 14  --  15  CREATININE 1.18 1.19 0.83  GLUCOSE 152*  --  167*    Electrolytes Recent Labs  Lab 03/29/17 0300 03/30/17 0249  CALCIUM 9.2 8.7*  MG  --  2.3  PHOS  --  2.5    CBC Recent Labs  Lab 03/29/17 0300 03/29/17 1036 03/30/17 0249  WBC 12.3* 8.0 7.6  HGB 14.9 12.8* 11.0*  HCT 43.6 38.8* 34.0*  PLT 234 201 187    Coag's No results for input(s): APTT, INR in the last 168 hours.  Sepsis Markers Recent Labs  Lab 03/29/17 0331 03/29/17 0436 03/29/17 1036  LATICACIDVEN 3.82* 2.3* 3.7*    ABG Recent Labs  Lab 03/29/17 0338 03/29/17 0524 03/29/17 0828  PHART 7.319* 7.280* 7.211*  PCO2ART 48.1* 51.1* 60.8*  PO2ART 188.0* 270.0* 72.0*    Liver Enzymes Recent Labs  Lab 03/29/17 0300  AST 36  ALT 12*  ALKPHOS 67  BILITOT 1.7*  ALBUMIN 3.7    Cardiac Enzymes No results for input(s): TROPONINI, PROBNP in the last 168 hours.  Glucose No results for input(s): GLUCAP in the last 168 hours.  Imaging No results found.  ASSESSMENT / PLAN: 67 year old with COPD exacerbation, atrial fibrillation, hypercarbic respiratory failure.  PULMONARY COPD exacerbation Active smoker Continue ceftriaxone, add azithromycin On steroids.  Wean down Supplemental oxygen.  BiPAP as needed Nebs  CARDIOVASCULAR SVT H/O a fib, coronary artery disease, CHF, HTN Restart p.o. Cardizem, Cozaar, Lasix Holding Lopressor,  Aldactone for now  Stable for transfer from ICU   Chilton Greathouse MD [facility] Pager [phone] If no answer or after 3pm call: [phone] 03/30/2017, 10:18 AM

## 2017-03-30 NOTE — Progress Notes (Signed)
eLink Physician-Brief Progress Note Patient Name: Johnny Rodriguez DOB: 09-23-1950 MRN: [id number]   Date of Service  03/30/2017  HPI/Events of Note  Troponin = 0.03 - Likely d/t demand ischemia related to SVT.   eICU Interventions  Continue to trend Troponin.      Intervention Category Intermediate Interventions: Diagnostic test evaluation  Lenell Antu 03/30/2017, 10:44 PM

## 2017-03-30 NOTE — Progress Notes (Signed)
Patients heart rate has increased again, afib RVR at 138. Spoke with Dr. Isaiah Serge and was given a verbal order to restart the cardizem drip. Orders placed and drip restarted.

## 2017-03-30 NOTE — Progress Notes (Signed)
Patient heart rate increased to low 100's. Patient in Afib with RVR. Paged Dr. Isaiah Serge who returned call. Patient given 5mg  lopressor once, heart rate is now in the 85-90's range. Transfer to telemetry has been cancelled for today per verbal order.

## 2017-03-30 NOTE — Progress Notes (Signed)
D/C transfer per Dr. Isaiah Serge until patient more stable. Will remain in the ICU today.

## 2017-03-30 NOTE — Progress Notes (Signed)
Pt taken off of BIPAP. Placed on [place] 4L, tolerating well. Will continue to monitor and assess.

## 2017-03-30 NOTE — Progress Notes (Signed)
ANTICOAGULATION CONSULT NOTE - Initial Consult  Pharmacy Consult for heparin Indication: atrial fibrillation  No Known Allergies  Patient Measurements: Height: 5\' 11"  (180.3 cm) Weight: 164 lb 10.9 oz (74.7 kg) IBW/kg (Calculated) : 75.3 Heparin Dosing Weight: 74.7 kg  Vital Signs: Temp: 98 F (36.7 C) (02/18 1520) Temp Source: Oral (02/18 1100) BP: 120/62 (02/18 1600) Pulse Rate: 104 (02/18 1600)  Labs: Recent Labs    03/29/17 0300 03/29/17 1036 03/30/17 0249  HGB 14.9 12.8* 11.0*  HCT 43.6 38.8* 34.0*  PLT 234 201 187  CREATININE 1.18 1.19 0.83    Estimated Creatinine Clearance: 93.8 mL/min (by C-G formula based on SCr of 0.83 mg/dL).   Medical History: Past Medical History:  Diagnosis Date  . A-fib (HCC)   . AF (atrial fibrillation) (HCC)   . Alcohol abuse   . Angina pectoris   . Arrhythmia   . CAD (coronary artery disease) 12/15/2004  . Cardiomyopathy, idiopathic (HCC)   . CHF (congestive heart failure) (HCC)   . COPD (chronic obstructive pulmonary disease) (HCC)   . Coronary artery disease   . Heavy cigarette smoker   . HTN (hypertension)   . Idiopathic cardiomyopathy (HCC) 07/16/2009  . Non compliance with medical treatment   . Seizures (HCC)     Medications:  Scheduled:  . diltiazem  180 mg Oral Daily  . famotidine (PEPCID) IV  20 mg Intravenous Q12H  . folic acid  1 mg Oral Daily  . furosemide  40 mg Oral Daily  . heparin  1,800 Units Intravenous Once  . ibuprofen  400 mg Oral Once  . ipratropium-albuterol  3 mL Nebulization TID  . losartan  25 mg Oral Daily  . mouth rinse  15 mL Mouth Rinse BID  . methylPREDNISolone (SOLU-MEDROL) injection  40 mg Intravenous Q6H  . metoprolol succinate  50 mg Oral Daily  . multivitamin with minerals  1 tablet Oral Daily  . thiamine  100 mg Oral Daily    Assessment: Johnny Rodriguez admitted with worsening shortness of breath. Having increased heart rates in Afib RVR. Pharmacy consulted to start heparin  infusion.  Pradaxa listed on PTA list but patient unsure when/if taking - last fill was in December. Received last dose of enoxaparin this morning.   Hgb decreased from 14.9 to 11 today, platelets WNL. No signs/symptoms of bleeding.   Goal of Therapy:  Heparin level 0.3-0.7 units/ml Monitor platelets by anticoagulation protocol: Yes   Plan:  Give 1800 units bolus x 1 Start heparin infusion at 1050 units/hr Check anti-Xa level in 6 hours and daily while on heparin Continue to monitor H&H and platelets  Girard Cooter, PharmD Clinical Pharmacist  Pager: [phone] Clinical Phone for 03/30/2017 until 3:30pm: [phone] If after 3:30pm, please call main pharmacy at [phone] 03/30/2017,5:25 PM

## 2017-03-31 ENCOUNTER — Inpatient Hospital Stay (HOSPITAL_COMMUNITY): Payer: Medicare Other

## 2017-03-31 ENCOUNTER — Encounter (HOSPITAL_COMMUNITY): Payer: Self-pay | Admitting: Cardiology

## 2017-03-31 DIAGNOSIS — I4891 Unspecified atrial fibrillation: Secondary | ICD-10-CM

## 2017-03-31 DIAGNOSIS — J441 Chronic obstructive pulmonary disease with (acute) exacerbation: Secondary | ICD-10-CM | POA: Diagnosis not present

## 2017-03-31 LAB — HEPARIN LEVEL (UNFRACTIONATED)
Heparin Unfractionated: 0.3 IU/mL (ref 0.30–0.70)
Heparin Unfractionated: 0.19 IU/mL — ABNORMAL LOW (ref 0.30–0.70)
Heparin Unfractionated: 0.41 IU/mL (ref 0.30–0.70)

## 2017-03-31 LAB — BASIC METABOLIC PANEL
Anion gap: 12 (ref 5–15)
BUN: 27 mg/dL — ABNORMAL HIGH (ref 6–20)
CO2: 24 mmol/L (ref 22–32)
Calcium: 8.8 mg/dL — ABNORMAL LOW (ref 8.9–10.3)
Chloride: 102 mmol/L (ref 101–111)
Creatinine, Ser: 0.96 mg/dL (ref 0.61–1.24)
GFR calc Af Amer: >60 mL/min (ref >60)
GFR calc non Af Amer: >60 mL/min (ref >60)
Glucose, Bld: 167 mg/dL — ABNORMAL HIGH (ref 65–99)
Potassium: 4.2 mmol/L (ref 3.5–5.1)
Sodium: 138 mmol/L (ref 135–145)

## 2017-03-31 LAB — PHOSPHORUS: Phosphorus: 3 mg/dL (ref 2.5–4.6)

## 2017-03-31 LAB — CBC
HCT: 34.6 % — ABNORMAL LOW (ref 39.0–52.0)
Hemoglobin: 11.4 g/dL — ABNORMAL LOW (ref 13.0–17.0)
MCH: 31.7 pg (ref 26.0–34.0)
MCHC: 32.9 g/dL (ref 30.0–36.0)
MCV: 96.1 fL (ref 78.0–100.0)
Platelets: 240 10*3/uL (ref 150–400)
RBC: 3.6 MIL/uL — ABNORMAL LOW (ref 4.22–5.81)
RDW: 15.4 % (ref 11.5–15.5)
WBC: 11.2 10*3/uL — ABNORMAL HIGH (ref 4.0–10.5)

## 2017-03-31 LAB — MAGNESIUM: Magnesium: 2.2 mg/dL (ref 1.7–2.4)

## 2017-03-31 LAB — TROPONIN I
Troponin I: <0.03 ng/mL (ref <0.03)
Troponin I: <0.03 ng/mL (ref <0.03)

## 2017-03-31 MED ORDER — ALPRAZOLAM 0.25 MG PO TABS
0.2500 mg | ORAL_TABLET | Freq: Two times a day (BID) | ORAL | Status: DC | PRN
  Administered 2017-03-31 – 2017-04-02 (×2): 0.25 mg via ORAL
  Filled 2017-03-31 (×2): qty 1

## 2017-03-31 MED ORDER — PANTOPRAZOLE SODIUM 40 MG PO TBEC
40.0000 mg | DELAYED_RELEASE_TABLET | Freq: Every day | ORAL | Status: DC
  Administered 2017-03-31 – 2017-04-01 (×2): 40 mg via ORAL
  Filled 2017-03-31 (×2): qty 1

## 2017-03-31 MED ORDER — LEVALBUTEROL HCL 0.63 MG/3ML IN NEBU: 0.6300 mg | INHALATION_SOLUTION | Freq: Four times a day (QID) | RESPIRATORY_TRACT | Status: DC | PRN

## 2017-03-31 MED ORDER — HEPARIN BOLUS VIA INFUSION
2000.0000 [IU] | Freq: Once | INTRAVENOUS | Status: AC
Start: 2017-03-31 — End: 2017-03-31
  Administered 2017-03-31: 2000 [IU] via INTRAVENOUS
  Filled 2017-03-31: qty 2000

## 2017-03-31 MED ORDER — HEPARIN BOLUS VIA INFUSION
2000.0000 [IU] | Freq: Once | INTRAVENOUS | Status: AC
  Administered 2017-03-31: 2000 [IU] via INTRAVENOUS
  Filled 2017-03-31: qty 2000

## 2017-03-31 MED ORDER — LEVALBUTEROL HCL 0.63 MG/3ML IN NEBU
0.6300 mg | INHALATION_SOLUTION | Freq: Three times a day (TID) | RESPIRATORY_TRACT | Status: DC
Start: 2017-04-01 — End: 2017-04-03
  Administered 2017-04-01 – 2017-04-03 (×7): 0.63 mg via RESPIRATORY_TRACT
  Filled 2017-03-31 (×7): qty 3

## 2017-03-31 MED ORDER — DILTIAZEM HCL ER COATED BEADS 240 MG PO CP24
240.0000 mg | ORAL_CAPSULE | Freq: Every day | ORAL | Status: DC
  Administered 2017-03-31 – 2017-04-03 (×4): 240 mg via ORAL
  Filled 2017-03-31 (×4): qty 1

## 2017-03-31 MED ORDER — METHYLPREDNISOLONE SODIUM SUCC 40 MG IJ SOLR
40.0000 mg | Freq: Two times a day (BID) | INTRAMUSCULAR | Status: DC
  Administered 2017-04-01: 40 mg via INTRAVENOUS
  Filled 2017-03-31: qty 1

## 2017-03-31 NOTE — Progress Notes (Signed)
ANTICOAGULATION CONSULT NOTE Pharmacy Consult for heparin Indication: atrial fibrillation  No Known Allergies  Patient Measurements: Height: 5\' 11"  (180.3 cm) Weight: 175 lb 11.3 oz (79.7 kg) IBW/kg (Calculated) : 75.3 Heparin Dosing Weight: 74.7 kg  Vital Signs: Temp: 97.5 F (36.4 C) (02/19 2059) Temp Source: Oral (02/19 2059) BP: 112/82 (02/19 2100) Pulse Rate: 100 (02/19 2100)  Labs: Recent Labs    03/29/17 1036 03/30/17 0249 03/30/17 1937  03/30/17 2304 03/31/17 0444 03/31/17 1059 03/31/17 2126  HGB 12.8* 11.0*  --   --   --  11.4*  --   --   HCT 38.8* 34.0*  --   --   --  34.6*  --   --   PLT 201 187  --   --   --  240  --   --   HEPARINUNFRC  --   --   --    < > 0.17* 0.30 0.19* 0.41  CREATININE 1.19 0.83  --   --   --  0.96  --   --   TROPONINI  --   --  0.03*  --  <0.03 <0.03  --   --    < > = values in this interval not displayed.    Estimated Creatinine Clearance: 81.7 mL/min (by C-G formula based on SCr of 0.96 mg/dL).  Assessment: 67 y.o. male with Afib continues on IV heparin.  Heparin level is therapeutic at 0.41 on 1400 units/hr. No bleeding noted.  Goal of Therapy:  Heparin level 0.3-0.7 units/ml Monitor platelets by anticoagulation protocol: Yes   Plan:  Continue heparin drip at 1400 units/hr Confirmatory heparin level with am labs Monitor for s/sx of bleeding   Loura Back, PharmD, BCPS Clinical Pharmacist Phone for today [phone] Main pharmacy - [phone] 03/31/2017 10:23 PM

## 2017-03-31 NOTE — Progress Notes (Signed)
 Johnny Rodriguez, Johnny Rodriguez  Date of visit:  06/24/2016 DOB:  08/20/1950    Age:  66 yrs. Medical record number:  [id number]     Account number:  [id number] Primary Care Provider: DEAN, ERIC L ____________________________ CURRENT DIAGNOSES  1. COPD  2. Dyspnea  3. Long term (current) use of anticoagulants  4. Patient's other noncompliance with medication regimen  5. Cardiomyopathy, unspecified  6. Paroxysmal atrial fibrillation  7. CAD Native without angina ____________________________ ALLERGIES  No Known Drug Allergies ____________________________ MEDICATIONS  1. albuterol sulfate HFA 90 mcg/actuation aerosol inhaler, PRN  2. Dulera 100 mcg-5 mcg/actuation HFA aerosol inhaler, 2 Rodriguez.o. t.i.d.  3. furosemide 40 mg tablet, 1 Rodriguez.o. daily  4. losartan 25 mg tablet, 1 Rodriguez.o. daily  5. metoprolol succinate ER 50 mg tablet,extended release 24 hr, 1 Rodriguez.o. daily  6. Pradaxa 150 mg capsule, BID  7. spironolactone 25 mg tablet, 1 Rodriguez.o. daily ____________________________ HISTORY OF PRESENT ILLNESS Patient returns for cardiac followup. Since he was previously here he did not keep his pulmonary appointment nor got a set of pulmonary function tests. I asked him to stop his amiodarone previously but he is still taking it. He is still wearing oxygen and is still dyspneic with exertion. He has very poor insight into his overall medical conditions. Is not currently having angina but is dyspneic with most any level of activity. He denies PND, orthopnea or edema. He still lives in a motel and has little social support at this time. Previous BNP level was low. ____________________________ PAST HISTORY  Past Medical Illnesses:  seizures, hypertension, history of alcohol abuse;  Cardiovascular Illnesses:  atrial fibrillation, cardiomyopathy(dilated);  Surgical Procedures:  fx collar bone;  NYHA Classification:  II;  Canadian Angina Classification:  Class 0: Asymptomatic;  Cardiology Procedures-Invasive:  cardiac cath (left)  November 2006, cardioversion December 2012;  Cardiology Procedures-Noninvasive:  echocardiogram February 2015;  Cardiac Cath Results:  50% stenosis RCA, normal Left main, no significant disease LAD, no significant disease CFX;  LVEF of 40% documented via echocardiogram on 06/16/2011,  CHADS Score:  2,  CHA2DS2-VASC Score:  3 ____________________________ CARDIO-PULMONARY TEST DATES EKG Date:  03/18/2016;   Cardiac Cath Date:  12/16/2004;  Echocardiography Date: 04/06/2013;  Chest Xray Date: 06/27/2013;  CT Scan Date:  12/03/2011   ____________________________ FAMILY HISTORY Brother -- Brother alive and well Father -- Father dead, Chronic obstructive pulmonary disease, Coronary Artery Disease Mother -- Mother dead, CVA Sister -- Sister alive and well Sister -- Sister dead, Suicide ____________________________ SOCIAL HISTORY Alcohol Use:  history of alcohol abuse, currently not drinking;  Smoking:  greater than 50 pack year history, smokes cigarettes, less than 1 ppd;  Diet:  regular diet and no added salt;  Lifestyle:  single;  Exercise:  exercise is limited due to physical disability;  Occupation:  disabled;  Residence:  lives alone;   ____________________________ REVIEW OF SYSTEMS General:  malaise and fatigue Respiratory: dyspnea with exertion Abdominal: denies dyspepsia, GI bleeding, constipation, or diarrhea Genitourinary-Male: no dysuria, urgency, frequency, or nocturia  Musculoskeletal:  arthritis of the left knee Neurological:  numbness in left hand Psychiatric:  history of panic disorder, situational stress  ____________________________ PHYSICAL EXAMINATION VITAL SIGNS  Blood Pressure:  130/80 Sitting, Right arm, regular cuff  , 130/78 Standing, Right arm and regular cuff   Pulse:  72/min. Weight:  173.00 lbs. Height:  70.00"BMI: 25  Constitutional:  pleasant white male in no acute distress wearing oxygen Skin:  warm and dry to touch, no apparent   skin lesions, or masses noted. Head:   Scar on front of head Chest:  normal symmetry, clear to auscultation Cardiac:  regular rhythm, normal S1 and S2, no S3 or S4 Peripheral Pulses:  the femoral,dorsalis pedis, and posterior tibial pulses are full and equal bilaterally with no bruits auscultated. Extremities & Back:  no deformities, clubbing, cyanosis, erythema or edema observed. Normal muscle strength and tone. Neurological:  no gross motor or sensory deficits noted, affect appropriate, oriented x3. ____________________________ MOST RECENT LIPID PANEL 09/14/12  CHOL TOTL 204 mg/dl, LDL 129 calc, HDL 53 mg/dl, TRIGLYCER 108 mg/dl, CHOL/HDL 3.8 (Calc) and VLDL 22 ____________________________ IMPRESSIONS/PLAN  1. Continued chronic dyspnea unclear whether there is any contribution of amiodarone to this. 2. Paroxysmal atrial fibrillation clinically in sinus rhythm today 3. Cardiomyopathy 4. COPD 5. Long-term use of anticoagulation 6. Medical noncompliance  Recommendations:  I took the bottle of amiodarone out of his bag and marked it and asked him not to take this. We scheduled a pulmonary pulmonary within in the office today and likely will need to have his pulmonary function test scheduled through them. Check sedimentation rate and chemistry panel today. Followup in 3 months. ____________________________ TODAYS ORDERS  1. Sed Rate: Today  2. Comprehensive Metabolic Panel: Today  3. Pulmonary Consult: Schedule ASAP  4. Return Visit: 3 months                       ____________________________ Cardiology Physician:  W. Spencer Tilley, Jr. MD FACC     

## 2017-03-31 NOTE — Progress Notes (Signed)
CRITICAL VALUE ALERT  Critical Value: troponin .03 Date & Time Notied:  2/18 2130  Provider Notified: E-link MD  Orders Received/Actions taken: none

## 2017-03-31 NOTE — Progress Notes (Signed)
PULMONARY / CRITICAL CARE MEDICINE   Name: LINLEY MOXLEY MRN: [id number] DOB: 22-Feb-1950    ADMISSION DATE:  03/29/2017 CONSULTATION DATE:  03/29/17  REFERRING MD:  ED Physician  CHIEF COMPLAINT:  Shortness of Breath  HISTORY OF PRESENT ILLNESS:   67 year old with history of atrial fibrillation, alcohol abuse, coronary artery disease, CHF, severe COPD admitted with dyspnea, COPD exacerbation, atrial fibrillation.  Admitted to the ICU for BiPAP.  He has refused BiPAP overnight Elevated lactate initially.  Given fluids, vent, Zosyn.  Started on Cardizem for SVT He was briefly on Precedex overnight. Failed transfer to floor on 218 due to atrial fibrillation ventricular response and anxiety.      VITAL SIGNS: BP 128/86   Pulse 100   Temp 97.6 F (36.4 C) (Oral)   Resp (!) 29   Ht 5\' 11"  (1.803 m)   Wt 79.7 kg (175 lb 11.3 oz)   SpO2 92%   BMI 24.51 kg/m        VENTILATOR SETTINGS:    INTAKE / OUTPUT: I/O last 3 completed shifts: In: 2872.7 [P.O.:720; I.V.:1602.7; IV Piggyback:550] Out: 2775 [Urine:2775]  PHYSICAL EXAMINATION: General: Anxious white male in no acute distress. HEENT: No JVD is appreciated, currently has nebulizer in place with facemask PSY: Anxious but but orientated Neuro: Follows commands moves all extremities CV: Heart sounds are regular regular PULM: Mild expiratory wheezes WU:JWJX, non-tender, bsx4 active  Extremities: warm/dry, plus edema, no left shoulder disfigurement Skin: no rashes or lesions   LABS:  BMET Recent Labs  Lab 03/29/17 0300 03/29/17 1036 03/30/17 0249 03/31/17 0444  NA 136  --  140 138  K 4.0  --  4.2 4.2  CL 99*  --  106 102  CO2 21*  --  24 24  BUN 14  --  15 27*  CREATININE 1.18 1.19 0.83 0.96  GLUCOSE 152*  --  167* 167*    Electrolytes Recent Labs  Lab 03/29/17 0300 03/30/17 0249 03/31/17 0444  CALCIUM 9.2 8.7* 8.8*  MG  --  2.3 2.2  PHOS  --  2.5 3.0    CBC Recent Labs  Lab 03/29/17 1036  03/30/17 0249 03/31/17 0444  WBC 8.0 7.6 11.2*  HGB 12.8* 11.0* 11.4*  HCT 38.8* 34.0* 34.6*  PLT 201 187 240    Coag's No results for input(s): APTT, INR in the last 168 hours.  Sepsis Markers Recent Labs  Lab 03/29/17 0436 03/29/17 1036 03/30/17 1023  LATICACIDVEN 2.3* 3.7* 1.3    ABG Recent Labs  Lab 03/29/17 0338 03/29/17 0524 03/29/17 0828  PHART 7.319* 7.280* 7.211*  PCO2ART 48.1* 51.1* 60.8*  PO2ART 188.0* 270.0* 72.0*    Liver Enzymes Recent Labs  Lab 03/29/17 0300  AST 36  ALT 12*  ALKPHOS 67  BILITOT 1.7*  ALBUMIN 3.7    Cardiac Enzymes Recent Labs  Lab 03/30/17 1937 03/30/17 2304 03/31/17 0444  TROPONINI 0.03* <0.03 <0.03    Glucose No results for input(s): GLUCAP in the last 168 hours.  Imaging Dg Chest Port 1 View  Result Date: 03/31/2017 CLINICAL DATA:  Acute respiratory failure EXAM: PORTABLE CHEST 1 VIEW COMPARISON:  03/29/2017 FINDINGS: Mild hyperinflation. Heart is borderline in size. No confluent airspace opacities or effusions. No acute bony abnormality. IMPRESSION: Mild hyperinflation.  No active disease. Electronically Signed   By: Charlett Nose M.D.   On: 03/31/2017 08:51    ASSESSMENT / PLAN: 67 year old with COPD exacerbation, atrial fibrillation, hypercarbic respiratory failure.  Was for transfer  out to 18 2019 but developed atrial fibrillation ventricular response, has refused to wear BiPAP.  We will continue to monitor in ICU for another 24 hours.  PULMONARY COPD exacerbation Active smoker Continue ceftriaxone, add azithromycin On steroids.  Wean down Supplemental oxygen.  BiPAP as needed, refuses BiPAP. Continue duo nebs but will substitute Xopenex for as needed treatment.  CARDIOVASCULAR SVT H/O a fib, coronary artery disease, CHF, HTN Restart p.o. Cardizem, Cozaar, Lasix Holding Lopressor, Aldactone for now 03/31/2017 currently on p.o. Cardizem and IV Cardizem.  Will increase p.o. Cardizem and try to wean IV  Cardizem off  Was for transfer out on 03/30/2017 developed atrial fibrillation ventricular response and required IV Cardizem.  219 is been unable to tolerate BiPAP due to anxiety.  We will add low-dose Xanax.    App cct 39 min   Brett Canales Minor ACNP [facility] PCCM Pager [phone] till 1 pm If no answer page [phone] 03/31/2017, 9:10 AM

## 2017-03-31 NOTE — Progress Notes (Signed)
ANTICOAGULATION CONSULT NOTE Pharmacy Consult for heparin Indication: atrial fibrillation  No Known Allergies  Patient Measurements: Height: 5\' 11"  (180.3 cm) Weight: 164 lb 10.9 oz (74.7 kg) IBW/kg (Calculated) : 75.3 Heparin Dosing Weight: 74.7 kg  Vital Signs: Temp: 98.2 F (36.8 C) (02/18 2300) Temp Source: Oral (02/18 2300) BP: 98/51 (02/18 2300) Pulse Rate: 80 (02/18 2300)  Labs: Recent Labs    03/29/17 0300 03/29/17 1036 03/30/17 0249 03/30/17 1937 03/30/17 2304  HGB 14.9 12.8* 11.0*  --   --   HCT 43.6 38.8* 34.0*  --   --   PLT 234 201 187  --   --   HEPARINUNFRC  --   --   --   --  0.17*  CREATININE 1.18 1.19 0.83  --   --   TROPONINI  --   --   --  0.03*  --     Estimated Creatinine Clearance: 93.8 mL/min (by C-G formula based on SCr of 0.83 mg/dL).  Assessment: 67 y.o. male with Afib for heparin  Goal of Therapy:  Heparin level 0.3-0.7 units/ml Monitor platelets by anticoagulation protocol: Yes   Plan:  Heparin 2000 units IV bolus, then increase heparin 1200 units/hr Follow-up am labs.   Geannie Risen, PharmD, BCPS   03/31/2017,12:09 AM

## 2017-03-31 NOTE — Consult Note (Addendum)
Cardiology Consultation:   Patient ID: [id number] COLQUHOUN; [id number]; 11/21/1950   Admit date: 03/29/2017 Date of Consult: 03/31/2017  Primary Care Provider: Gwenyth Bender, MD Primary Cardiologist: Dr. Donnie Aho  Primary Electrophysiologist:  Dr. Ladona Ridgel  Patient Profile:   Johnny Rodriguez is a 67 y.o. male with a hx of persistent atrial fibrillation on Pradaxa, CAD (no stenting, 50% RCA), oxygen dependent COPD, hypertension, ongoing tobacco abuse and hyperlipidemia who is being seen today for the evaluation of atrial fibrillation at the request of Dr. Isaiah Serge.  History of syncope of uncertain etiology, who underwent insertion of an implantable loop recorder years ago, and was found to have atrial fibrillation, and was placed on systemic anticoagulation.  Atrial fib rate was well controlled when last seen by Dr. Ladona Ridgel November 21, 2016.  History of Present Illness:   Johnny Rodriguez presented 2/17 with progressive worsening shortness of breath.  He had shortness of breath for the past few weeks but worsened 2 days prior to presentation.  He has cough without fever.  He denies any chest pain, syncope or lower extremity edema.   He is compliant with medication.  Ongoing tobacco smoking.   The patient admitted for acute COPD exacerbation requiring BiPAP.  He was tachycardic at rate of 205 bpm on presentation.  Started on IV Cardizem.  Now rate improved to 70s.  He is Cardizem CD uptitrated to 240 mg daily.  Added Toprol XL 50 mg daily.  Currently on IV Cardizem 5 mg/h.   Past Medical History:  Diagnosis Date  . A-fib (HCC)   . AF (atrial fibrillation) (HCC)   . Alcohol abuse   . Angina pectoris   . Arrhythmia   . CAD (coronary artery disease) 12/15/2004  . Cardiomyopathy, idiopathic (HCC)   . CHF (congestive heart failure) (HCC)   . COPD (chronic obstructive pulmonary disease) (HCC)   . Coronary artery disease   . Heavy cigarette smoker   . HTN (hypertension)   . Idiopathic cardiomyopathy (HCC)  07/16/2009  . Non compliance with medical treatment   . Seizures (HCC)     Past Surgical History:  Procedure Laterality Date  . CARDIAC CATHETERIZATION  2006  . CARDIAC CATHETERIZATION  12/2004   Left   . CARDIOVERSION  01/21/2011   Procedure: CARDIOVERSION;  Surgeon: Darden Palmer., MD;  [facility];  Service: Cardiovascular;  Laterality: N/A;  . DOPPLER ECHOCARDIOGRAPHY  02/2011  . ELECTROPHYSIOLOGY STUDY N/A 12/16/2011   Procedure: ELECTROPHYSIOLOGY STUDY;  Surgeon: Marinus Maw, MD;  Location: [facility] CATH LAB;  Service: Cardiovascular;  Laterality: N/A;  . EP IMPLANTABLE DEVICE N/A 08/24/2015   Procedure: Loop Recorder Removal;  Surgeon: Marinus Maw, MD;  Location: [facility] INVASIVE CV LAB;  Service: Cardiovascular;  Laterality: N/A;  . fx collar bone    . NO PAST SURGERIES     Inpatient Medications: Scheduled Meds: . diltiazem  240 mg Oral Daily  . folic acid  1 mg Oral Daily  . furosemide  40 mg Oral Daily  . ipratropium-albuterol  3 mL Nebulization TID  . losartan  25 mg Oral Daily  . mouth rinse  15 mL Mouth Rinse BID  . methylPREDNISolone (SOLU-MEDROL) injection  40 mg Intravenous Q6H  . metoprolol succinate  50 mg Oral Daily  . multivitamin with minerals  1 tablet Oral Daily  . pantoprazole  40 mg Oral Q1200  . thiamine  100 mg Oral Daily   Continuous Infusions: . sodium chloride    .  azithromycin Stopped (03/31/17 1101)  . cefTRIAXone (ROCEPHIN)  IV 1 g (03/31/17 1430)  . diltiazem (CARDIZEM) infusion 5 mg/hr (03/31/17 1239)  . heparin 1,400 Units/hr (03/31/17 1243)   PRN Meds: sodium chloride, acetaminophen, ALPRAZolam, levalbuterol, metoprolol tartrate, ondansetron (ZOFRAN) IV  Allergies:   No Known Allergies  Social History:   Social History   Socioeconomic History  . Marital status: Single    Spouse name: Not on file  . Number of children: Not on file  . Years of education: Not on file  . Highest education level: Not on file  Social Needs  .  Financial resource strain: Not on file  . Food insecurity - worry: Not on file  . Food insecurity - inability: Not on file  . Transportation needs - medical: Not on file  . Transportation needs - non-medical: Not on file  Occupational History  . Occupation: Production manager: UNEMPLOYED  Tobacco Use  . Smoking status: Current Every Day Smoker    Packs/day: 2.00    Years: 58.00    Pack years: 116.00  . Smokeless tobacco: Never Used  . Tobacco comment: pt states he is down to 1ppd  Substance and Sexual Activity  . Alcohol use: Yes    Alcohol/week: 0.0 oz    Comment: EtOH use diorder - liquor  . Drug use: No  . Sexual activity: Not on file  Other Topics Concern  . Not on file  Social History Narrative   ** Merged History Encounter **        Family History:    Family History  Problem Relation Age of Onset  . Suicidality Sister   . CVA Mother   . Stroke Mother   . Heart disease Father   . Coronary artery disease Father   . COPD Father      ROS:  Please see the history of present illness.  All other ROS reviewed and negative.     Physical Exam/Data:   Vitals:   03/31/17 1152 03/31/17 1200 03/31/17 1300 03/31/17 1400  BP: 92/67 (!) 83/63 108/76 (!) 117/59  Pulse: 76 86 77 (!) 50  Resp: (!) 21 (!) 25 (!) 27 (!) 29  Temp: 97.6 F (36.4 C)     TempSrc: Oral     SpO2:  94% 97% 91%  Weight:      Height:        Intake/Output Summary (Last 24 hours) at 03/31/2017 1441 Last data filed at 03/31/2017 1400 Gross per 24 hour  Intake 1169.05 ml  Output 2150 ml  Net -980.95 ml   Filed Weights   03/29/17 0605 03/29/17 1027 03/31/17 0600  Weight: 155 lb (70.3 kg) 164 lb 10.9 oz (74.7 kg) 175 lb 11.3 oz (79.7 kg)   Body mass index is 24.51 kg/m.  General:  Well nourished, well developed, in no acute distress HEENT: normal Lymph: no adenopathy Neck: no JVD Endocrine:  No thryomegaly Vascular: No carotid bruits; FA pulses 2+ bilaterally without bruits    Cardiac:  normal S1, S2; irregularly irregular;  Lungs: Diminished breath sounds throughout with wheezing Abd: soft, nontender, no hepatomegaly.  Distended Ext: no edema Musculoskeletal:  No deformities, BUE and BLE strength normal and equal Skin: warm and dry  Neuro:  CNs 2-12 intact, no focal abnormalities noted Psych:  Normal affect   EKG:  The EKG was personally reviewed and demonstrates: Atrial fibrillation/tachycardia vs SVT at rate of 205 bpm Telemetry:  Telemetry was personally reviewed and demonstrates:  Atrial fibrillation at rate of 70s  Relevant CV Studies: As above  Laboratory Data:  Chemistry Recent Labs  Lab 03/29/17 0300 03/29/17 1036 03/30/17 0249 03/31/17 0444  NA 136  --  140 138  K 4.0  --  4.2 4.2  CL 99*  --  106 102  CO2 21*  --  24 24  GLUCOSE 152*  --  167* 167*  BUN 14  --  15 27*  CREATININE 1.18 1.19 0.83 0.96  CALCIUM 9.2  --  8.7* 8.8*  GFRNONAA >60 >60 >60 >60  GFRAA >60 >60 >60 >60  ANIONGAP 16*  --  10 12    Recent Labs  Lab 03/29/17 0300  PROT 7.6  ALBUMIN 3.7  AST 36  ALT 12*  ALKPHOS 67  BILITOT 1.7*   Hematology Recent Labs  Lab 03/29/17 1036 03/30/17 0249 03/31/17 0444  WBC 8.0 7.6 11.2*  RBC 4.09* 3.58* 3.60*  HGB 12.8* 11.0* 11.4*  HCT 38.8* 34.0* 34.6*  MCV 94.9 95.0 96.1  MCH 31.3 30.7 31.7  MCHC 33.0 32.4 32.9  RDW 14.8 14.9 15.4  PLT 201 187 240   Cardiac Enzymes Recent Labs  Lab 03/30/17 1937 03/30/17 2304 03/31/17 0444  TROPONINI 0.03* <0.03 <0.03    Recent Labs  Lab 03/29/17 0329  TROPIPOC 0.00    BNPNo results for input(s): BNP, PROBNP in the last 168 hours.  DDimer No results for input(s): DDIMER in the last 168 hours.  Radiology/Studies:  Dg Chest Port 1 View  Result Date: 03/31/2017 CLINICAL DATA:  Acute respiratory failure EXAM: PORTABLE CHEST 1 VIEW COMPARISON:  03/29/2017 FINDINGS: Mild hyperinflation. Heart is borderline in size. No confluent airspace opacities or effusions. No  acute bony abnormality. IMPRESSION: Mild hyperinflation.  No active disease. Electronically Signed   By: Charlett Nose M.D.   On: 03/31/2017 08:51   Dg Chest Port 1 View  Result Date: 03/29/2017 CLINICAL DATA:  Shortness of breath. EXAM: PORTABLE CHEST 1 VIEW COMPARISON:  01/23/2017 FINDINGS: Mild chronic hyperinflation. Increased peribronchial thickening from prior exam. Heart is normal in size. Aortic arch atherosclerosis. No confluent consolidation. No pleural effusion or pneumothorax. No acute osseous abnormalities. IMPRESSION: Chronic hyperinflation. Increased bronchial thickening from prior exam suspicious for COPD exacerbation/acute bronchitis. Electronically Signed   By: Rubye Oaks M.D.   On: 03/29/2017 03:50    Assessment and Plan:   1. Persistent atrial fibrillation -Rate now improved.  Will wean off IV Cardizem.  Continue Cardizem CD 240 mg daily and Toprol-XL 50 mg daily.  May up titrate as needed.  If blood pressure is a issue may consider reducing dose of losartan. -Rate likely driven by underlying pulmonary exacerbation. -Currently on IV heparin.  Resume home Pradaxa for anticoagulation.  2.  Acute COPD exacerbation -Per primary team  3.  Hypertension -Currently stable.  As described above.  4.  Hx of Unspecified cardiomyopathy -Unknown EF.  Followed by Dr. Donnie Aho.  Resume home medication.  No lower extremity edema.  Continue Lasix.  For questions or updates, please contact [facility] Please consult [email or web address] for contact info under Cardiology/STEMI.   Lorelei Pont, [place]  03/31/2017 2:41 PM   History and all data above reviewed.  Patient examined.  I agree with the findings as above.  We are called as the patient has been in atrial fib and rate was increased.  He cannot give any real history.  He has had cardioversion in the past. We do not have office records.  As far as we know he has been in chronic atrial fib.  He is supposed to be on Pradaxa  but pharmacy reports that he has not been getting his prescriptions.  He says he has but it is obvious that he is confused about details The patient exam reveals WUJ:[id number]  ,  Lungs: Decreased breath sounds with diffuse scattered wheezing  ,  Abd: Positive bowel sounds, no rebound no guarding, Ext No edema  .  All available labs, radiology testing, previous records reviewed. Agree with documented assessment and plan. Atrial fib:  Absolutely not a DCCV candidate as we cannot be sure he would take his anticoagulation and it is doubtful that he has been on it.  The best we can do is restart Pradaxa this admission and rate control.  Dr. Donnie Aho knows him and can decide further management.  He has a report of a mildly reduced EF but the most recent report from a few years ago that I can find is that his EF was 50%.  This again can be followed by Dr. Donnie Aho as an outpatient.    Audrick Lurena Naeve  3:38 PM  03/31/2017

## 2017-03-31 NOTE — Progress Notes (Signed)
Johnny Rodriguez  Date of visit:  09/14/2015 DOB:  1950/05/15    Age:  67 yrs. Medical record number:  [id number]     Account number:  [id number] Primary Care Provider: Fransisco Hertz ____________________________ CURRENT DIAGNOSES  1. Cardiomyopathy, unspecified  2. Heart failure, unspecified  3. Long term (current) use of anticoagulants  4. Patient's other noncompliance with medication regimen  5. Presence of cardiac pacemaker  6. Chronic atrial fibrillation  7. Atherosclerotic heart disease of native coronary artery without angina pectoris  8. Other long term (current) drug therapy  9. Chronic obstructive pulmonary disease with (acute) exacerbation  10. Cardiomyopathy-idiopathic ____________________________ ALLERGIES  No Known Drug Allergies ____________________________ MEDICATIONS  1. Spiriva with HandiHaler 18 mcg capsule, w/inhalation device, qd  2. ProAir RespiClick 90 mcg/actuation breath activated, as needed  3. olanzapine 5 mg tablet, 1 p.o. daily  4. Pradaxa 150 mg capsule, BID  5. amiodarone 200 mg tablet, 1 p.o. daily  6. spironolactone 25 mg tablet, 1 p.o. daily  7. furosemide 40 mg tablet, 1 p.o. daily  8. metoprolol succinate ER 50 mg tablet,extended release 24 hr, 1 p.o. daily  9. losartan 25 mg tablet, 1 p.o. daily ____________________________ HISTORY OF PRESENT ILLNESS Patient returns after a two-year absence. He was last seen about 2 years ago but stopped coming here and quit taking his medications. He was hospitalized earlier this year with shortness of breath and has been placed on oxygen because of COPD. He evidently had been without his medications for over a year. He recently had explantation of a loop monitor that was put in a few years ago. He continues to smoke and does some drinking. He basically had quit all of his medications except for his pulmonary medications. He was recently started back on Pradaxa and was started recently on amiodarone. Still short of  breath. Denies chest pain. Has no PND or orthopnea. He has a poor social situation and is currently living in a hotel. ____________________________ PAST HISTORY  Past Medical Illnesses:  seizures, hypertension, history of alcohol abuse;  Cardiovascular Illnesses:  atrial fibrillation, cardiomyopathy(dilated);  Surgical Procedures:  fx collar bone;  NYHA Classification:  II;  Canadian Angina Classification:  Class 0: Asymptomatic;  Cardiology Procedures-Invasive:  cardiac cath (left) November 2006, cardioversion December 2012;  Cardiology Procedures-Noninvasive:  echocardiogram February 2015;  Cardiac Cath Results:  50% stenosis RCA, normal Left main, no significant disease LAD, no significant disease CFX;  LVEF of 40% documented via echocardiogram on 06/16/2011,  CHADS Score:  2,  CHA2DS2-VASC Score:  3 ____________________________ CARDIO-PULMONARY TEST DATES EKG Date:  12/01/2011;   Cardiac Cath Date:  12/16/2004;  Echocardiography Date: 04/06/2013;  Chest Xray Date: 06/27/2013;  CT Scan Date:  12/03/2011   ____________________________ FAMILY HISTORY Brother -- Brother alive and well Father -- Father dead, Chronic obstructive pulmonary disease, Coronary Artery Disease Mother -- Mother dead, CVA Sister -- Sister alive and well Sister -- Sister dead, Suicide ____________________________ SOCIAL HISTORY Alcohol Use:  history of alcohol abuse, currently not drinking;  Smoking:  greater than 50 pack year history, smokes cigarettes, less than 1 ppd;  Diet:  regular diet and no added salt;  Lifestyle:  single;  Exercise:  some exercise and walking;  Occupation:  Holiday representative;  Residence:  lives alone;   ____________________________ REVIEW OF SYSTEMS General:  malaise and fatigue Respiratory: dyspnea with exertion Abdominal: denies dyspepsia, GI bleeding, constipation, or diarrhea Genitourinary-Male: no dysuria, urgency, frequency, or nocturia  Musculoskeletal:  arthritis of the left  knee Neurological:   numbness in left hand Psychiatric:  history of panic disorder, situational stress  ____________________________ PHYSICAL EXAMINATION VITAL SIGNS  Blood Pressure:  120/78 Sitting, Right arm, regular cuff  , 120/80 Standing, Right arm and regular cuff   Pulse:  64/min. Weight:  165.00 lbs. Height:  70"BMI: 23  Constitutional:  pleasant white male in no acute distress Skin:  warm and dry to touch, no apparent skin lesions, or masses noted. Head:  Scar on front of head Chest:  normal symmetry, clear to auscultation Cardiac:  regular rhythm, normal S1 and S2, no S3 or S4 Peripheral Pulses:  the femoral,dorsalis pedis, and posterior tibial pulses are full and equal bilaterally with no bruits auscultated. Extremities & Back:  no deformities, clubbing, cyanosis, erythema or edema observed. Normal muscle strength and tone. Neurological:  no gross motor or sensory deficits noted, affect appropriate, oriented x3. ____________________________ MOST RECENT LIPID PANEL 09/14/12  CHOL TOTL 204 mg/dl, LDL 161 calc, HDL 53 mg/dl, TRIGLYCER 096 mg/dl, CHOL/HDL 3.8 (Calc) and VLDL 22 ____________________________ IMPRESSIONS/PLAN  1. Cardiomyopathy 2. Paroxysmal atrial fibrillation recently in sinus rhythm 3. Long-term use of anticoagulation 4. Chronic amiodarone therapy 5. CAD 6. Noncompliance  Recommendations:  Prescriptions were sent in for him. Recent lab work reviewed. He will be started back on amiodarone but should not continue to receive this it is not willing to followup. He was given referral [facility] to try to help with his social situation. ____________________________ TODAYS ORDERS  1. Return Visit: 6 months  2. 12 Lead EKG: 6 months                       ____________________________ Cardiology Physician:  Darden Palmer MD [facility]

## 2017-03-31 NOTE — Progress Notes (Signed)
Johnny Rodriguez  Date of visit:  09/23/2016 DOB:  07-06-1950    Age:  66 yrs. Medical record number:  [id number]     Account number:  [id number] Primary Care Provider: August Saucer, ERIC L ____________________________ CURRENT DIAGNOSES  1. COPD  2. Dyspnea  3. Long term (current) use of anticoagulants  4. Patient's other noncompliance with medication regimen  5. Cardiomyopathy, unspecified  6. Paroxysmal atrial fibrillation  7. CAD Native without angina ____________________________ ALLERGIES  No Known Drug Allergies ____________________________ MEDICATIONS  1. albuterol sulfate HFA 90 mcg/actuation aerosol inhaler, PRN  2. Dulera 100 mcg-5 mcg/actuation HFA aerosol inhaler, 2 p.o. b.i.d.  3. furosemide 40 mg tablet, 1 p.o. daily  4. losartan 25 mg tablet, 1 p.o. daily  5. metoprolol succinate ER 50 mg tablet,extended release 24 hr, 1 p.o. daily  6. Pradaxa 150 mg capsule, BID  7. spironolactone 25 mg tablet, 1 p.o. daily  8. Vitamin D3 2,000 unit capsule, 1 p.o. daily ____________________________ HISTORY OF PRESENT ILLNESS Patient returns for cardiac followup. He saw the pulmonary physician and had spirometry that showed very severe lung disease with an FEV1 of around 1 L. He stated that the doctor that saw him was very rude. He still wears oxygen and becomes dyspneic with a severe level of activity. He is evidently not using amiodarone now. He has had no bleeding complications from anticoagulation. He denies angina and is not currently having edema. Previous BNP level was quite low. ____________________________ PAST HISTORY  Past Medical Illnesses:  seizures, hypertension, history of alcohol abuse;  Cardiovascular Illnesses:  atrial fibrillation, cardiomyopathy(dilated);  Surgical Procedures:  fx collar bone;  NYHA Classification:  II;  Canadian Angina Classification:  Class 0: Asymptomatic;  Cardiology Procedures-Invasive:  cardiac cath (left) November 2006, cardioversion December 2012;   Cardiology Procedures-Noninvasive:  echocardiogram February 2015;  Cardiac Cath Results:  50% stenosis RCA, normal Left main, no significant disease LAD, no significant disease CFX;  LVEF of 45% documented via echocardiogram on 04/06/2013,  CHADS Score:  2,  CHA2DS2-VASC Score:  3 ____________________________ CARDIO-PULMONARY TEST DATES EKG Date:  03/18/2016;   Cardiac Cath Date:  12/16/2004;  Echocardiography Date: 04/06/2013;  Chest Xray Date: 06/27/2013;  CT Scan Date:  12/03/2011   ____________________________ FAMILY HISTORY Brother -- Brother alive and well Father -- Father dead, Chronic obstructive pulmonary disease, Coronary Artery Disease Mother -- Mother dead, CVA Sister -- Sister alive and well Sister -- Sister dead, Suicide ____________________________ SOCIAL HISTORY Alcohol Use:  history of alcohol abuse, currently not drinking;  Smoking:  greater than 50 pack year history, smokes cigarettes, less than 1 ppd;  Diet:  regular diet and no added salt;  Lifestyle:  single;  Exercise:  exercise is limited due to physical disability;  Occupation:  disabled;  Residence:  lives alone;   ____________________________ REVIEW OF SYSTEMS General:  malaise and fatigue Respiratory: dyspnea with exertion Abdominal: denies dyspepsia, GI bleeding, constipation, or diarrhea Genitourinary-Male: no dysuria, urgency, frequency, or nocturia  Musculoskeletal:  arthritis of the left knee Neurological:  numbness in left hand Psychiatric:  history of panic disorder, situational stress  ____________________________ PHYSICAL EXAMINATION VITAL SIGNS  Blood Pressure:  128/90 Sitting, Right arm, regular cuff  , 130/94 Standing, Right arm and regular cuff   Pulse:  64/min. Weight:  178.00 lbs. Height:  70.00"BMI: 25  Constitutional:  pleasant white male in no acute distress wearing oxygen Skin:  warm and dry to touch, no apparent skin lesions, or masses noted. Head:  Scar on front  of head Chest:  normal  symmetry, clear to auscultation Cardiac:  regular rhythm, normal S1 and S2, no S3 or S4 Peripheral Pulses:  the femoral,dorsalis pedis, and posterior tibial pulses are full and equal bilaterally with no bruits auscultated. Extremities & Back:  no deformities, clubbing, cyanosis, erythema or edema observed. Normal muscle strength and tone. Neurological:  no gross motor or sensory deficits noted, affect appropriate, oriented x3. ____________________________ MOST RECENT LIPID PANEL 09/14/12  CHOL TOTL 204 mg/dl, LDL 161 calc, HDL 53 mg/dl, TRIGLYCER 096 mg/dl, CHOL/HDL 3.8 (Calc) and VLDL 22 ____________________________ IMPRESSIONS/PLAN  1. 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 Severe COPD 3. Volume status is reasonably compensated 4. Paroxysmal atrial fibrillation currently in sinus rhythm clinically 5. Long-term use of anticoagulation without complication  Recommendations:  Continue current medicines. Followup in 6 months. Continue pulmonary followup. ____________________________ TODAYS ORDERS  1. 12 Lead EKG: 6 months  2. Return Visit: 6 months                       ____________________________ Cardiology Physician:  Darden Palmer MD [facility]

## 2017-03-31 NOTE — Progress Notes (Signed)
Mottram, Crue P  Date of visit:  09/23/2016 DOB:  07/27/1950    Age:  66 yrs. Medical record number:  [id number]     Account number:  [id number] Primary Care Provider: DEAN, ERIC L ____________________________ CURRENT DIAGNOSES  1. COPD  2. Dyspnea  3. Long term (current) use of anticoagulants  4. Patient's other noncompliance with medication regimen  5. Cardiomyopathy, unspecified  6. Paroxysmal atrial fibrillation  7. CAD Native without angina ____________________________ ALLERGIES  No Known Drug Allergies ____________________________ MEDICATIONS  1. albuterol sulfate HFA 90 mcg/actuation aerosol inhaler, PRN  2. Dulera 100 mcg-5 mcg/actuation HFA aerosol inhaler, 2 p.o. b.i.d.  3. furosemide 40 mg tablet, 1 p.o. daily  4. losartan 25 mg tablet, 1 p.o. daily  5. metoprolol succinate ER 50 mg tablet,extended release 24 hr, 1 p.o. daily  6. Pradaxa 150 mg capsule, BID  7. spironolactone 25 mg tablet, 1 p.o. daily  8. Vitamin D3 2,000 unit capsule, 1 p.o. daily ____________________________ HISTORY OF PRESENT ILLNESS Patient returns for cardiac followup. He saw the pulmonary physician and had spirometry that showed very severe lung disease with an FEV1 of around 1 L. He stated that the doctor that saw him was very rude. He still wears oxygen and becomes dyspneic with a severe level of activity. He is evidently not using amiodarone now. He has had no bleeding complications from anticoagulation. He denies angina and is not currently having edema. Previous BNP level was quite low. ____________________________ PAST HISTORY  Past Medical Illnesses:  seizures, hypertension, history of alcohol abuse;  Cardiovascular Illnesses:  atrial fibrillation, cardiomyopathy(dilated);  Surgical Procedures:  fx collar bone;  NYHA Classification:  II;  Canadian Angina Classification:  Class 0: Asymptomatic;  Cardiology Procedures-Invasive:  cardiac cath (left) November 2006, cardioversion December 2012;   Cardiology Procedures-Noninvasive:  echocardiogram February 2015;  Cardiac Cath Results:  50% stenosis RCA, normal Left main, no significant disease LAD, no significant disease CFX;  LVEF of 45% documented via echocardiogram on 04/06/2013,  CHADS Score:  2,  CHA2DS2-VASC Score:  3 ____________________________ CARDIO-PULMONARY TEST DATES EKG Date:  03/18/2016;   Cardiac Cath Date:  12/16/2004;  Echocardiography Date: 04/06/2013;  Chest Xray Date: 06/27/2013;  CT Scan Date:  12/03/2011   ____________________________ FAMILY HISTORY Brother -- Brother alive and well Father -- Father dead, Chronic obstructive pulmonary disease, Coronary Artery Disease Mother -- Mother dead, CVA Sister -- Sister alive and well Sister -- Sister dead, Suicide ____________________________ SOCIAL HISTORY Alcohol Use:  history of alcohol abuse, currently not drinking;  Smoking:  greater than 50 pack year history, smokes cigarettes, less than 1 ppd;  Diet:  regular diet and no added salt;  Lifestyle:  single;  Exercise:  exercise is limited due to physical disability;  Occupation:  disabled;  Residence:  lives alone;   ____________________________ REVIEW OF SYSTEMS General:  malaise and fatigue Respiratory: dyspnea with exertion Abdominal: denies dyspepsia, GI bleeding, constipation, or diarrhea Genitourinary-Male: no dysuria, urgency, frequency, or nocturia  Musculoskeletal:  arthritis of the left knee Neurological:  numbness in left hand Psychiatric:  history of panic disorder, situational stress  ____________________________ PHYSICAL EXAMINATION VITAL SIGNS  Blood Pressure:  128/90 Sitting, Right arm, regular cuff  , 130/94 Standing, Right arm and regular cuff   Pulse:  64/min. Weight:  178.00 lbs. Height:  70.00"BMI: 25  Constitutional:  pleasant white male in no acute distress wearing oxygen Skin:  warm and dry to touch, no apparent skin lesions, or masses noted. Head:  Scar on front   of head Chest:  normal  symmetry, clear to auscultation Cardiac:  regular rhythm, normal S1 and S2, no S3 or S4 Peripheral Pulses:  the femoral,dorsalis pedis, and posterior tibial pulses are full and equal bilaterally with no bruits auscultated. Extremities & Back:  no deformities, clubbing, cyanosis, erythema or edema observed. Normal muscle strength and tone. Neurological:  no gross motor or sensory deficits noted, affect appropriate, oriented x3. ____________________________ MOST RECENT LIPID PANEL 09/14/12  CHOL TOTL 204 mg/dl, LDL 129 calc, HDL 53 mg/dl, TRIGLYCER 108 mg/dl, CHOL/HDL 3.8 (Calc) and VLDL 22 ____________________________ IMPRESSIONS/PLAN  1. 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 Severe COPD 3. Volume status is reasonably compensated 4. Paroxysmal atrial fibrillation currently in sinus rhythm clinically 5. Long-term use of anticoagulation without complication  Recommendations:  Continue current medicines. Followup in 6 months. Continue pulmonary followup. ____________________________ TODAYS ORDERS  1. 12 Lead EKG: 6 months  2. Return Visit: 6 months                       ____________________________ Cardiology Physician:  W. Spencer Winefred Hillesheim, Jr. MD [facility]    

## 2017-03-31 NOTE — Progress Notes (Signed)
ANTICOAGULATION CONSULT NOTE Pharmacy Consult for heparin Indication: atrial fibrillation  No Known Allergies  Patient Measurements: Height: 5\' 11"  (180.3 cm) Weight: 175 lb 11.3 oz (79.7 kg) IBW/kg (Calculated) : 75.3 Heparin Dosing Weight: 74.7 kg  Vital Signs: Temp: 97.6 F (36.4 C) (02/19 1152) Temp Source: Oral (02/19 1152) BP: 83/63 (02/19 1200) Pulse Rate: 86 (02/19 1200)  Labs: Recent Labs    03/29/17 1036 03/30/17 0249 03/30/17 1937 03/30/17 2304 03/31/17 0444 03/31/17 1059  HGB 12.8* 11.0*  --   --  11.4*  --   HCT 38.8* 34.0*  --   --  34.6*  --   PLT 201 187  --   --  240  --   HEPARINUNFRC  --   --   --  0.17* 0.30 0.19*  CREATININE 1.19 0.83  --   --  0.96  --   TROPONINI  --   --  0.03* <0.03 <0.03  --     Estimated Creatinine Clearance: 81.7 mL/min (by C-G formula based on SCr of 0.96 mg/dL).  Assessment: 67 y.o. male with Afib continues on IV heparin.  Heparin level this morning was at low end of goal and has since fallen below goal to 0.19. No bleeding or IV issues noted, cbc stable overnight. Will adjust rate.  Goal of Therapy:  Heparin level 0.3-0.7 units/ml Monitor platelets by anticoagulation protocol: Yes   Plan:  Heparin 2000 units IV bolus, then increase heparin 1400 units/hr Heparin level in 8 hours  Sheppard Coil PharmD., BCPS Clinical Pharmacist 03/31/2017 12:35 PM

## 2017-03-31 NOTE — Plan of Care (Signed)
Patient is currently on 4L nasal cannula. He refuses to be placed on bipap stating he is claustrophobic. Patients oxygen saturation remains in the upper 80's to very low 90's and his RR is in the 20's. He becomes very short of breath with activity as well as with talking. Rn has tried to educate him on the need for adequate ventilation for overall health and well being. He is alert and oriented but his ability to comprehend is questionable. He often repeats himself, ask questions multiple times, and seems confused at times.

## 2017-03-31 NOTE — Progress Notes (Signed)
Johnny Rodriguez  Date of visit:  03/18/2016 DOB:  07-27-1950    Age:  67 yrs. Medical record number:  [id number]     Account number:  [id number] Primary Care Provider: August Saucer, ERIC L ____________________________ CURRENT DIAGNOSES  1. COPD  2. Dyspnea  3. Long term (current) use of anticoagulants  4. Patient's other noncompliance with medication regimen  5. Cardiomyopathy, unspecified  6. Paroxysmal atrial fibrillation  7. CAD Native without angina ____________________________ ALLERGIES  No Known Drug Allergies ____________________________ MEDICATIONS  1. olanzapine 5 mg tablet, 1 p.o. daily  2. Pradaxa 150 mg capsule, BID  3. amiodarone 200 mg tablet, 1 p.o. daily  4. spironolactone 25 mg tablet, 1 p.o. daily  5. furosemide 40 mg tablet, 1 p.o. daily  6. metoprolol succinate ER 50 mg tablet,extended release 24 hr, 1 p.o. daily  7. losartan 25 mg tablet, 1 p.o. daily  8. Dulera 100 mcg-5 mcg/actuation HFA aerosol inhaler, 2 p.o. t.i.d.  9. albuterol sulfate HFA 90 mcg/actuation aerosol inhaler, PRN ____________________________ CHIEF COMPLAINTS  Followup of Cardiomyopathy, unspecified ____________________________ HISTORY OF PRESENT ILLNESS Patient returns for cardiac followup. He was hospitalized in December with exacerbation of COPD and severe respiratory failure. He was sent home on oxygen. He has been severely short of breath since then. He has cut his smoking down but is still severely hypoxic. He has a mild infiltrate in his lower lobe on x-ray when he was hospitalized. He has been on amiodarone for some time but has not been great about followup. He evidently was not seen by pulmonary when he was in the hospital. He currently denies chest pain and has no PND orthopnea or claudication. ____________________________ PAST HISTORY  Past Medical Illnesses:  seizures, hypertension, history of alcohol abuse;  Cardiovascular Illnesses:  atrial fibrillation, cardiomyopathy(dilated);  Surgical  Procedures:  fx collar bone;  NYHA Classification:  II;  Canadian Angina Classification:  Class 0: Asymptomatic;  Cardiology Procedures-Invasive:  cardiac cath (left) November 2006, cardioversion December 2012;  Cardiology Procedures-Noninvasive:  echocardiogram February 2015;  Cardiac Cath Results:  50% stenosis RCA, normal Left main, no significant disease LAD, no significant disease CFX;  LVEF of 40% documented via echocardiogram on 06/16/2011,  CHADS Score:  2,  CHA2DS2-VASC Score:  3 ____________________________ CARDIO-PULMONARY TEST DATES EKG Date:  03/18/2016;   Cardiac Cath Date:  12/16/2004;  Echocardiography Date: 04/06/2013;  Chest Xray Date: 06/27/2013;  CT Scan Date:  12/03/2011   ____________________________ FAMILY HISTORY Brother -- Brother alive and well Father -- Father dead, Chronic obstructive pulmonary disease, Coronary Artery Disease Mother -- Mother dead, CVA Sister -- Sister alive and well Sister -- Sister dead, Suicide ____________________________ SOCIAL HISTORY Alcohol Use:  history of alcohol abuse, currently not drinking;  Smoking:  greater than 50 pack year history, smokes cigarettes, less than 1 ppd;  Diet:  regular diet and no added salt;  Lifestyle:  single;  Exercise:  some exercise and walking;  Occupation:  Holiday representative;  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 Weight:  172.00 lbs. Height:  70"BMI:  24  Constitutional:  pleasant white male in no acute distress wearing oxygen Skin:  warm and dry to touch, no apparent skin lesions, or masses noted. Head:  Scar on front of head Chest:  normal symmetry, clear to auscultation Cardiac:  regular rhythm, normal S1 and S2, no S3 or S4 Peripheral Pulses:  the femoral,dorsalis pedis, and posterior tibial pulses are full and equal bilaterally with no bruits auscultated. Extremities & Back:  no deformities, clubbing, cyanosis, erythema or edema observed. Normal muscle strength and tone. Neurological:  no gross motor or sensory deficits noted, affect appropriate, oriented x3. ____________________________ MOST RECENT LIPID PANEL 09/14/12  CHOL TOTL 204 mg/dl, LDL 814 calc, HDL 53 mg/dl, TRIGLYCER 481 mg/dl, CHOL/HDL 3.8 (Calc) and VLDL 22 ____________________________ IMPRESSIONS/PLAN  1. Severe COPD currently requiring oxygen nocturnally and her pulmonary care 2. History of paroxysmal atrial fibrillation currently on amiodarone but with hypoxemia 3. Long-term use of anticoagulation with Pradaxa 4. History of CAD  Recommendations:  I like him to have a set of pulmonary functions and to see a pulmonologist. He was wheezing today and I'm uncomfortable with him remaining on amiodarone at the present time in the setting of all this hypoxia. Asked him to stop taking it and we'll await the pulmonary consult. I will see him back in followup in 3 months. Continue other medications. ____________________________ TODAYS ORDERS  1. Pulmonary Consult: Schedule ASAP  2. Spirometry with pre/post bronchodilators: First Available  3. BNP: Today  4. TSH: Today  5. Return Visit: 3 months  6. 12 Lead EKG: Today                       ____________________________ Cardiology Physician:  Darden Palmer MD [facility]

## 2017-04-01 ENCOUNTER — Inpatient Hospital Stay (HOSPITAL_COMMUNITY): Payer: Medicare Other

## 2017-04-01 DIAGNOSIS — I480 Paroxysmal atrial fibrillation: Secondary | ICD-10-CM | POA: Diagnosis not present

## 2017-04-01 LAB — PHOSPHORUS: Phosphorus: 2.9 mg/dL (ref 2.5–4.6)

## 2017-04-01 LAB — BASIC METABOLIC PANEL
Anion gap: 15 (ref 5–15)
BUN: 20 mg/dL (ref 6–20)
CO2: 27 mmol/L (ref 22–32)
Calcium: 8.9 mg/dL (ref 8.9–10.3)
Chloride: 97 mmol/L — ABNORMAL LOW (ref 101–111)
Creatinine, Ser: 0.8 mg/dL (ref 0.61–1.24)
GFR calc Af Amer: >60 mL/min (ref >60)
GFR calc non Af Amer: >60 mL/min (ref >60)
Glucose, Bld: 164 mg/dL — ABNORMAL HIGH (ref 65–99)
Potassium: 4 mmol/L (ref 3.5–5.1)
Sodium: 139 mmol/L (ref 135–145)

## 2017-04-01 LAB — CBC
HCT: 40.5 % (ref 39.0–52.0)
Hemoglobin: 13.1 g/dL (ref 13.0–17.0)
MCH: 31 pg (ref 26.0–34.0)
MCHC: 32.3 g/dL (ref 30.0–36.0)
MCV: 95.7 fL (ref 78.0–100.0)
Platelets: 273 10*3/uL (ref 150–400)
RBC: 4.23 MIL/uL (ref 4.22–5.81)
RDW: 15.2 % (ref 11.5–15.5)
WBC: 11.1 10*3/uL — ABNORMAL HIGH (ref 4.0–10.5)

## 2017-04-01 LAB — MAGNESIUM: Magnesium: 2.2 mg/dL (ref 1.7–2.4)

## 2017-04-01 LAB — HEPARIN LEVEL (UNFRACTIONATED): Heparin Unfractionated: 0.4 IU/mL (ref 0.30–0.70)

## 2017-04-01 MED ORDER — PREDNISONE 20 MG PO TABS
60.0000 mg | ORAL_TABLET | Freq: Every day | ORAL | Status: DC
  Administered 2017-04-02 – 2017-04-03 (×2): 60 mg via ORAL
  Filled 2017-04-01 (×2): qty 3

## 2017-04-01 NOTE — Progress Notes (Signed)
ANTICOAGULATION CONSULT NOTE Pharmacy Consult for heparin Indication: atrial fibrillation  No Known Allergies  Patient Measurements: Height: 5\' 11"  (180.3 cm) Weight: 175 lb 11.3 oz (79.7 kg) IBW/kg (Calculated) : 75.3 Heparin Dosing Weight: 74.7 kg  Vital Signs: Temp: 97.8 F (36.6 C) (02/20 0718) Temp Source: Oral (02/20 0718) BP: 142/94 (02/20 0719) Pulse Rate: 90 (02/20 0719)  Labs: Recent Labs    03/30/17 0249 03/30/17 1937  03/30/17 2304 03/31/17 0444 03/31/17 1059 03/31/17 2126 04/01/17 0842  HGB 11.0*  --   --   --  11.4*  --   --  13.1  HCT 34.0*  --   --   --  34.6*  --   --  40.5  PLT 187  --   --   --  240  --   --  273  HEPARINUNFRC  --   --    < > 0.17* 0.30 0.19* 0.41 0.40  CREATININE 0.83  --   --   --  0.96  --   --  0.80  TROPONINI  --  0.03*  --  <0.03 <0.03  --   --   --    < > = values in this interval not displayed.    Estimated Creatinine Clearance: 98 mL/min (by C-G formula based on SCr of 0.8 mg/dL).  Assessment: 67 y.o. male with Afib continues on IV heparin.  Heparin level is therapeutic at 0.40 on 1400 units/hr. CBC is stable. No sign/symptoms of bleeding noted. No issues with infusion per nursing.  Goal of Therapy:  Heparin level 0.3-0.7 units/ml Monitor platelets by anticoagulation protocol: Yes   Plan:  Continue heparin drip at 1400 units/hr Monitor daily HL and CBC Monitor for s/sx of bleeding Follow up plans to transition to oral anticoagulant in future  Girard Cooter, PharmD Clinical Pharmacist  Pager: [phone] Clinical Phone for 04/01/2017 until 3:30pm: [phone] If after 3:30pm, please call main pharmacy at [phone] 04/01/2017 10:48 AM

## 2017-04-01 NOTE — Plan of Care (Signed)
  Progressing Education: Knowledge of General Education information will improve 04/01/2017 2114 - Progressing by Renelda Mom, RN Health Behavior/Discharge Planning: Ability to manage health-related needs will improve 04/01/2017 2114 - Progressing by Renelda Mom, RN Clinical Measurements: Ability to maintain clinical measurements within normal limits will improve 04/01/2017 2114 - Progressing by Renelda Mom, RN Will remain free from infection 04/01/2017 2114 - Progressing by Renelda Mom, RN

## 2017-04-01 NOTE — Progress Notes (Signed)
PULMONARY / CRITICAL CARE MEDICINE   Name: Johnny Rodriguez MRN: [id number] DOB: [birth year]    ADMISSION DATE:  03/29/2017 CONSULTATION DATE:  03/29/17  REFERRING MD:  ED Physician  CHIEF COMPLAINT:  Shortness of Breath  HISTORY OF PRESENT ILLNESS:   67 year old with history of atrial fibrillation, alcohol abuse, coronary artery disease, CHF, severe COPD admitted with dyspnea, COPD exacerbation, atrial fibrillation.  Admitted to the ICU for BiPAP.  He has refused BiPAP overnight Elevated lactate initially.  Given fluids, vent, Zosyn.  Started on Cardizem for SVT He was briefly on Precedex overnight. Failed transfer to floor on 218 due to atrial fibrillation ventricular response and anxiety.      VITAL SIGNS: BP (!) 142/94   Pulse 90   Temp 97.8 F (36.6 C) (Oral)   Resp 20   Ht 5\' 11"  (1.803 m)   Wt 79.7 kg (175 lb 11.3 oz)   SpO2 92%   BMI 24.51 kg/m        VENTILATOR SETTINGS:    INTAKE / OUTPUT: I/O last 3 completed shifts: In: 1216.7 [P.O.:240; I.V.:626.7; IV Piggyback:350] Out: 3300 [Urine:3300]  PHYSICAL EXAMINATION: General: Frail male in no acute distress HEENT: No JVD or lymphadenopathy appreciated PSY: Affect is less anxious Neuro: Intact follows commands CV: Heart sounds are regular PULM: Decreased breath sounds in bases mild expiratory wheeze WG:NFAO, non-tender, bsx4 active  Extremities: warm/dry, negative edema  Skin: no rashes or lesions    LABS:  BMET Recent Labs  Lab 03/29/17 0300 03/29/17 1036 03/30/17 0249 03/31/17 0444  NA 136  --  140 138  K 4.0  --  4.2 4.2  CL 99*  --  106 102  CO2 21*  --  24 24  BUN 14  --  15 27*  CREATININE 1.18 1.19 0.83 0.96  GLUCOSE 152*  --  167* 167*    Electrolytes Recent Labs  Lab 03/29/17 0300 03/30/17 0249 03/31/17 0444  CALCIUM 9.2 8.7* 8.8*  MG  --  2.3 2.2  PHOS  --  2.5 3.0    CBC Recent Labs  Lab 03/30/17 0249 03/31/17 0444 04/01/17 0842  WBC 7.6 11.2* 11.1*  HGB 11.0*  11.4* 13.1  HCT 34.0* 34.6* 40.5  PLT 187 240 273    Coag's No results for input(s): APTT, INR in the last 168 hours.  Sepsis Markers Recent Labs  Lab 03/29/17 0436 03/29/17 1036 03/30/17 1023  LATICACIDVEN 2.3* 3.7* 1.3    ABG Recent Labs  Lab 03/29/17 0338 03/29/17 0524 03/29/17 0828  PHART 7.319* 7.280* 7.211*  PCO2ART 48.1* 51.1* 60.8*  PO2ART 188.0* 270.0* 72.0*    Liver Enzymes Recent Labs  Lab 03/29/17 0300  AST 36  ALT 12*  ALKPHOS 67  BILITOT 1.7*  ALBUMIN 3.7    Cardiac Enzymes Recent Labs  Lab 03/30/17 1937 03/30/17 2304 03/31/17 0444  TROPONINI 0.03* <0.03 <0.03    Glucose No results for input(s): GLUCAP in the last 168 hours.  Imaging Dg Chest Port 1 View  Result Date: 04/01/2017 CLINICAL DATA:  Shortness of Breath EXAM: PORTABLE CHEST 1 VIEW COMPARISON:  03/31/2017 FINDINGS: Cardiac shadow is within normal limits. Aortic calcifications are again seen. Lungs are hyperinflated without focal infiltrate or sizable effusion. Minimal changes are noted in the left lateral costophrenic angle likely related atelectasis as they were not present on the previous exam. No bony abnormality is noted. IMPRESSION: Minimal left basilar atelectasis. Electronically Signed   By: Alcide Clever M.D.   On:  04/01/2017 08:36    ASSESSMENT / PLAN: 67 year old with COPD exacerbation, atrial fibrillation, hypercarbic respiratory failure.  Was for transfer out to 18 2019 but developed atrial fibrillation ventricular response, has refused to wear BiPAP.  We will continue to monitor in ICU for another 24 hours.  04/01/2017 transferred to stepdown unit P CCM will ask [facility] to assume his care.  PULMONARY COPD exacerbation Active smoker Continue ceftriaxone, and azithromycin On steroids.  Wean down note there were decreased to 40 mg IV every 12 hours on 04/01/2017 Supplemental oxygen.refuses BiPAP. Continue duo nebs but will substitute Xopenex for as needed  treatment.  CARDIOVASCULAR SVT H/O a fib, coronary artery disease, CHF, HTN Restart p.o. Cardizem, Cozaar, Lasix Holding Lopressor, Aldactone for now 03/31/2017 currently on p.o. Cardizem and IV Cardizem.  Will increased p.o. Cardizem to 40 on 03/31/2017 and is able to wean off IV Cardizem. We also had cardiology consulted to further define his cardiac care. He is now to stepdown unit.  04/01/2017 transferred to stepdown unit. 04/01/2017 will ask try to assume his care on 04/02/2017.  Pulmonary critical care will be available as needed.     Brett Canales Minor ACNP [facility] PCCM Pager [phone] till 1 pm If no answer page [phone] 04/01/2017, 10:17 AM

## 2017-04-01 NOTE — Progress Notes (Signed)
Received patient to 4E15 from 2H [facility] paitent with labored breathing all questions answered. Called patient' place of residents per patient request

## 2017-04-01 NOTE — Progress Notes (Signed)
Progress Note  Patient Name: Johnny Rodriguez Date of Encounter: 04/01/2017  Primary Cardiologist:   No primary care provider on file.   Subjective   He thinks that his breathing is better.  No distress  Inpatient Medications    Scheduled Meds: . diltiazem  240 mg Oral Daily  . folic acid  1 mg Oral Daily  . furosemide  40 mg Oral Daily  . levalbuterol  0.63 mg Nebulization TID  . losartan  25 mg Oral Daily  . mouth rinse  15 mL Mouth Rinse BID  . methylPREDNISolone (SOLU-MEDROL) injection  40 mg Intravenous Q12H  . metoprolol succinate  50 mg Oral Daily  . multivitamin with minerals  1 tablet Oral Daily  . pantoprazole  40 mg Oral Q1200  . thiamine  100 mg Oral Daily   Continuous Infusions: . sodium chloride    . azithromycin Stopped (03/31/17 1101)  . cefTRIAXone (ROCEPHIN)  IV Stopped (03/31/17 1500)  . diltiazem (CARDIZEM) infusion Stopped (03/31/17 1545)  . heparin 1,400 Units/hr (04/01/17 0600)   PRN Meds: sodium chloride, acetaminophen, ALPRAZolam, levalbuterol, metoprolol tartrate, ondansetron (ZOFRAN) IV   Vital Signs    Vitals:   04/01/17 0600 04/01/17 0718 04/01/17 0719 04/01/17 1228  BP: 138/80  (!) 142/94 122/83  Pulse: 89  90 (!) 101  Resp: (!) 25  20 17   Temp:  97.8 F (36.6 C)    TempSrc:  Oral  Oral  SpO2: 95% (!) 88% 92% 95%  Weight:      Height:        Intake/Output Summary (Last 24 hours) at 04/01/2017 1248 Last data filed at 04/01/2017 1228 Gross per 24 hour  Intake 511 ml  Output 1800 ml  Net -1289 ml   Filed Weights   03/29/17 1027 03/31/17 0600 04/01/17 0500  Weight: 164 lb 10.9 oz (74.7 kg) 175 lb 11.3 oz (79.7 kg) 175 lb 11.3 oz (79.7 kg)    Telemetry    Atrial fib with rate controlled. - Personally Reviewed  ECG    NA - Personally Reviewed  Physical Exam   GEN: No acute distress.   Neck: No  JVD Cardiac: Irregular RR, no murmurs, rubs, or gallops.  Respiratory:   Decreased breath sounds.  GI: Soft, nontender,  non-distended  MS: No  edema; No deformity. Neuro:  Nonfocal  Psych: Normal affect   Labs    Chemistry Recent Labs  Lab 03/29/17 0300  03/30/17 0249 03/31/17 0444 04/01/17 0842  NA 136  --  140 138 139  K 4.0  --  4.2 4.2 4.0  CL 99*  --  106 102 97*  CO2 21*  --  24 24 27   GLUCOSE 152*  --  167* 167* 164*  BUN 14  --  15 27* 20  CREATININE 1.18   < > 0.83 0.96 0.80  CALCIUM 9.2  --  8.7* 8.8* 8.9  PROT 7.6  --   --   --   --   ALBUMIN 3.7  --   --   --   --   AST 36  --   --   --   --   ALT 12*  --   --   --   --   ALKPHOS 67  --   --   --   --   BILITOT 1.7*  --   --   --   --   GFRNONAA >60   < > >60 >60 >60  GFRAA >60   < > >60 >60 >60  ANIONGAP 16*  --  10 12 15    < > = values in this interval not displayed.     Hematology Recent Labs  Lab 03/30/17 0249 03/31/17 0444 04/01/17 0842  WBC 7.6 11.2* 11.1*  RBC 3.58* 3.60* 4.23  HGB 11.0* 11.4* 13.1  HCT 34.0* 34.6* 40.5  MCV 95.0 96.1 95.7  MCH 30.7 31.7 31.0  MCHC 32.4 32.9 32.3  RDW 14.9 15.4 15.2  PLT 187 240 273    Cardiac Enzymes Recent Labs  Lab 03/30/17 1937 03/30/17 2304 03/31/17 0444  TROPONINI 0.03* <0.03 <0.03    Recent Labs  Lab 03/29/17 0329  TROPIPOC 0.00     BNPNo results for input(s): BNP, PROBNP in the last 168 hours.   DDimer No results for input(s): DDIMER in the last 168 hours.   Radiology    Dg Chest Port 1 View  Result Date: 04/01/2017 CLINICAL DATA:  Shortness of Breath EXAM: PORTABLE CHEST 1 VIEW COMPARISON:  03/31/2017 FINDINGS: Cardiac shadow is within normal limits. Aortic calcifications are again seen. Lungs are hyperinflated without focal infiltrate or sizable effusion. Minimal changes are noted in the left lateral costophrenic angle likely related atelectasis as they were not present on the previous exam. No bony abnormality is noted. IMPRESSION: Minimal left basilar atelectasis. Electronically Signed   By: Alcide Clever M.D.   On: 04/01/2017 08:36   Dg Chest  Port 1 View  Result Date: 03/31/2017 CLINICAL DATA:  Acute respiratory failure EXAM: PORTABLE CHEST 1 VIEW COMPARISON:  03/29/2017 FINDINGS: Mild hyperinflation. Heart is borderline in size. No confluent airspace opacities or effusions. No acute bony abnormality. IMPRESSION: Mild hyperinflation.  No active disease. Electronically Signed   By: Charlett Nose M.D.   On: 03/31/2017 08:51    Cardiac Studies   NA  Patient Profile     67 y.o. male with a hx of persistent atrial fibrillation on Pradaxa, CAD (no stenting, 50% RCA), oxygen dependent COPD, hypertension, ongoing tobacco abuse and hyperlipidemia who is being seen for the evaluation of atrial fibrillation at the request of Dr. Isaiah Serge.   Assessment & Plan    ATRIAL FIB:  Dr. Donnie Aho made his notes available and I reviewed.  He has had PAF and was in sinus in May.  He was taking amiodarone which was to be stopped because of his lung issues.  This was stopped.  He was on Pradaxa but as in the consult, I suspect he was not taking this.  This should be restarted if OK with primary team.    For questions or updates, please contact [facility] Please consult [email or web address] for contact info under Cardiology/STEMI.   Signed, Rollene Rotunda, MD  04/01/2017, 12:48 PM

## 2017-04-02 ENCOUNTER — Other Ambulatory Visit: Payer: Self-pay

## 2017-04-02 ENCOUNTER — Inpatient Hospital Stay (HOSPITAL_COMMUNITY): Payer: Medicare Other

## 2017-04-02 DIAGNOSIS — J9601 Acute respiratory failure with hypoxia: Secondary | ICD-10-CM

## 2017-04-02 DIAGNOSIS — J441 Chronic obstructive pulmonary disease with (acute) exacerbation: Secondary | ICD-10-CM | POA: Diagnosis not present

## 2017-04-02 DIAGNOSIS — Z720 Tobacco use: Secondary | ICD-10-CM | POA: Diagnosis not present

## 2017-04-02 DIAGNOSIS — I4891 Unspecified atrial fibrillation: Secondary | ICD-10-CM | POA: Diagnosis not present

## 2017-04-02 DIAGNOSIS — J9602 Acute respiratory failure with hypercapnia: Secondary | ICD-10-CM | POA: Diagnosis not present

## 2017-04-02 LAB — PHOSPHORUS: Phosphorus: 3.4 mg/dL (ref 2.5–4.6)

## 2017-04-02 LAB — BASIC METABOLIC PANEL
Anion gap: 12 (ref 5–15)
BUN: 21 mg/dL — ABNORMAL HIGH (ref 6–20)
CO2: 31 mmol/L (ref 22–32)
Calcium: 8.5 mg/dL — ABNORMAL LOW (ref 8.9–10.3)
Chloride: 95 mmol/L — ABNORMAL LOW (ref 101–111)
Creatinine, Ser: 0.87 mg/dL (ref 0.61–1.24)
GFR calc Af Amer: >60 mL/min (ref >60)
GFR calc non Af Amer: >60 mL/min (ref >60)
Glucose, Bld: 156 mg/dL — ABNORMAL HIGH (ref 65–99)
Potassium: 3.8 mmol/L (ref 3.5–5.1)
Sodium: 138 mmol/L (ref 135–145)

## 2017-04-02 LAB — CBC
HCT: 37.5 % — ABNORMAL LOW (ref 39.0–52.0)
Hemoglobin: 12.7 g/dL — ABNORMAL LOW (ref 13.0–17.0)
MCH: 32 pg (ref 26.0–34.0)
MCHC: 33.9 g/dL (ref 30.0–36.0)
MCV: 94.5 fL (ref 78.0–100.0)
Platelets: 303 10*3/uL (ref 150–400)
RBC: 3.97 MIL/uL — ABNORMAL LOW (ref 4.22–5.81)
RDW: 14.7 % (ref 11.5–15.5)
WBC: 13 10*3/uL — ABNORMAL HIGH (ref 4.0–10.5)

## 2017-04-02 LAB — HEPARIN LEVEL (UNFRACTIONATED): Heparin Unfractionated: 0.64 IU/mL (ref 0.30–0.70)

## 2017-04-02 LAB — MAGNESIUM: Magnesium: 2.2 mg/dL (ref 1.7–2.4)

## 2017-04-02 MED ORDER — DABIGATRAN ETEXILATE MESYLATE 150 MG PO CAPS
150.0000 mg | ORAL_CAPSULE | Freq: Two times a day (BID) | ORAL | Status: DC
  Administered 2017-04-02 – 2017-04-03 (×3): 150 mg via ORAL
  Filled 2017-04-02 (×3): qty 1

## 2017-04-02 NOTE — Progress Notes (Addendum)
ANTICOAGULATION CONSULT NOTE Pharmacy Consult for heparin Indication: atrial fibrillation  No Known Allergies  Patient Measurements: Height: 5\' 11"  (180.3 cm) Weight: 172 lb 11.2 oz (78.3 kg) IBW/kg (Calculated) : 75.3 Heparin Dosing Weight: 74.7 kg  Vital Signs: Temp: 98.8 F (37.1 C) (02/21 0412) Temp Source: Oral (02/21 0412) BP: 135/89 (02/21 0412)  Labs: Recent Labs    03/30/17 1937  03/30/17 2304  03/31/17 0444  03/31/17 2126 04/01/17 0842 04/02/17 0332  HGB  --   --   --    < > 11.4*  --   --  13.1 12.7*  HCT  --   --   --   --  34.6*  --   --  40.5 37.5*  PLT  --   --   --   --  240  --   --  273 303  HEPARINUNFRC  --    < > 0.17*  --  0.30   < > 0.41 0.40 0.64  CREATININE  --   --   --   --  0.96  --   --  0.80 0.87  TROPONINI 0.03*  --  <0.03  --  <0.03  --   --   --   --    < > = values in this interval not displayed.    Estimated Creatinine Clearance: 90.2 mL/min (by C-G formula based on SCr of 0.87 mg/dL).  Assessment: 67 y.o. male with Afib continues on IV heparin.  Heparin level is therapeutic at 0.64 on 1400 units/hr. Hgb is stable (12.7), platelets WNL (303). No sign/symptoms of bleeding noted. No issues with infusion per nursing.  Goal of Therapy:  Heparin level 0.3-0.7 units/ml Monitor platelets by anticoagulation protocol: Yes   Plan:  Continue heparin drip at 1400 units/hr Monitor daily HL and CBC Monitor for s/sx of bleeding Follow up plans to transition to oral anticoagulant in future  Girard Cooter, PharmD Clinical Pharmacist  Pager: [phone] Clinical Phone for 04/02/2017 until 3:30pm: [phone] If after 3:30pm, please call main pharmacy at [phone] 04/02/2017 7:59 AM  ADDENDUM Plan to transition to Pradaxa 150 mg twice daily (as PTA). Will stop heparin infusion and administer first dose at time of infusion discontinuation. Will order BMP tomorrow to monitor renal function. Monitor for signs/symptoms of bleeding.   Girard Cooter,  PharmD Clinical Pharmacist  Pager: [phone] Phone: [phone]

## 2017-04-02 NOTE — Progress Notes (Signed)
Assisted Pt to ambulate approx 100 ft using RW. On 3L O2  Pts SPO2 down to 79% on standing.  Increased O2 to 5L for walk.  SPO2 never above 82%.  To recliner after walk, irritated that probe "not working."  Explained that pulse ox is working correctly, that his oxygen saturation is very low.  Unclear if he understands.  Probe changed to satisfy Pt.  SPO2 up 90% on 4L after 5 mins sitting.   Will con't plan of care.

## 2017-04-02 NOTE — Progress Notes (Addendum)
PROGRESS NOTE   Johnny Rodriguez  QMG:[id number]    DOB: 01/08/1951    DOA: 03/29/2017  PCP: Johnny Bender, MD   I have briefly reviewed patients previous medical [facility] in [facility].  Brief Narrative:  67 year old single male, currently living in a motel, PMH of atrial fibrillation, alcohol abuse, CAD, chronic diastolic CHF, severe COPD presented with dyspnea and admitted for COPD exacerbation.  Admitted by CCM to ICU for BiPAP.  Cardiology consulted for A. fib with RVR and was on Cardizem drip.  Briefly on Precedex drip.  Improved/stabilized and transferred to stepdown and [facility] care on 2/21.   Assessment & Plan:   Active Problems:   COPD exacerbation (HCC)   1. COPD exacerbation: Clinically improved.  Has been transitioned to oral prednisone 60 mg daily.  Day 6 of antibiotics.  Chest x-ray 2/21 without acute findings.  Discontinued antibiotics.  Tobacco cessation counseled.  Flutter valve added.  Continue bronchodilator nebulizations. 2. Tobacco abuse: Smoked a pack per day.  Cessation counseled. 3. Acute hypercapnic & hypoxic respiratory failure: Secondary to COPD exacerbation.  Off of BiPAP.  Titrate oxygen to saturations between 88-92%.  Wean to room air if possible.  Need to reassess desat evaluation prior to discharge to determine home oxygen requirement. 4. Persistent atrial fibrillation with RVR/SVT: Briefly on IV Cardizem drip, now off.  Continue oral Cardizem 240 daily, metoprolol XL 50 mg daily.  Currently on IV heparin drip, transition to Xarelto.  Cardiology input appreciated. 5. Chronic diastolic CHF: Compensated.  Continue Lasix. 6. Essential hypertension: Controlled.  Continue Cardizem, Toprol and Cozaar. 7. Hyperlipidemia   DVT prophylaxis: Currently on IV heparin infusion, transition to Pradaxa. Code Status: DNR Family Communication: None at bedside Disposition: Possible DC home pending clinical improvement, possibly 2/22.  Downgraded from stepdown to telemetry on  2/21.   Consultants:  CCM-signed off Cardiology  Procedures:  BiPAP  Antimicrobials:  IV ceftriaxone and azithromycin/discontinued 2/21   Subjective: States that he feels much better than on admission.  Indicates that his breathing is almost but not yet back to baseline.  Minimal dry intermittent cough.  No chest pain, palpitations, fever or chills.  Reports smoking a pack of cigarettes per day PTA.  Claims compliance to Pradaxa and other meds PTA.  ROS: As above.  Objective:  Vitals:   04/02/17 0300 04/02/17 0412 04/02/17 0431 04/02/17 0907  BP:  135/89    Pulse:      Resp:      Temp:  98.8 F (37.1 C)    TempSrc:  Oral    SpO2:    93%  Weight: 78.3 kg (172 lb 11.2 oz)  78.3 kg (172 lb 11.2 oz)   Height:        Examination:  General exam: Pleasant middle-aged male, moderately built and nourished lying comfortably propped up in bed.  Eating breakfast. Respiratory system: Distant breath sounds but clear to auscultation without wheezing, rhonchi or crackles. Respiratory effort normal. Cardiovascular system: S1 & S2 heard, RRR. No JVD, murmurs, rubs, gallops or clicks. No pedal edema.  Telemetry personally reviewed: A. fib with controlled ventricular rate.  Occasional pause approximately 2.1 seconds. Gastrointestinal system: Abdomen is nondistended, soft and nontender. No organomegaly or masses felt. Normal bowel sounds heard. Central nervous system: Alert and oriented. No focal neurological deficits. Extremities: Symmetric 5 x 5 power. Skin: No rashes, lesions or ulcers Psychiatry: Judgement and insight appear normal. Mood & affect appropriate.     Data Reviewed: I have  personally reviewed following labs and imaging studies  CBC: Recent Labs  Lab 03/29/17 0300 03/29/17 1036 03/30/17 0249 03/31/17 0444 04/01/17 0842 04/02/17 0332  WBC 12.3* 8.0 7.6 11.2* 11.1* 13.0*  NEUTROABS 8.6*  --  6.7  --   --   --   HGB 14.9 12.8* 11.0* 11.4* 13.1 12.7*  HCT 43.6 38.8*  34.0* 34.6* 40.5 37.5*  MCV 94.4 94.9 95.0 96.1 95.7 94.5  PLT 234 201 187 240 273 303   Basic Metabolic Panel: Recent Labs  Lab 03/29/17 0300 03/29/17 1036 03/30/17 0249 03/31/17 0444 04/01/17 0842 04/02/17 0332  NA 136  --  140 138 139 138  K 4.0  --  4.2 4.2 4.0 3.8  CL 99*  --  106 102 97* 95*  CO2 21*  --  24 24 27 31   GLUCOSE 152*  --  167* 167* 164* 156*  BUN 14  --  15 27* 20 21*  CREATININE 1.18 1.19 0.83 0.96 0.80 0.87  CALCIUM 9.2  --  8.7* 8.8* 8.9 8.5*  MG  --   --  2.3 2.2 2.2 2.2  PHOS  --   --  2.5 3.0 2.9 3.4   Liver Function Tests: Recent Labs  Lab 03/29/17 0300  AST 36  ALT 12*  ALKPHOS 67  BILITOT 1.7*  PROT 7.6  ALBUMIN 3.7   Cardiac Enzymes: Recent Labs  Lab 03/30/17 1937 03/30/17 2304 03/31/17 0444  TROPONINI 0.03* <0.03 <0.03     Recent Results (from the past 240 hour(s))  Culture, blood (Routine X 2) w Reflex to ID Panel     Status: None (Preliminary result)   Collection Time: 03/29/17  3:30 AM  Result Value Ref Range Status   Specimen Description BLOOD RIGHT HAND  Final   Special Requests   Final    BOTTLES DRAWN AEROBIC AND ANAEROBIC Blood Culture adequate volume   Culture   Final    NO GROWTH 3 DAYS Performed at [facility], [address] [place], [place] [address]    Report Status PENDING  Incomplete  Culture, blood (Routine X 2) w Reflex to ID Panel     Status: None (Preliminary result)   Collection Time: 03/29/17  3:40 AM  Result Value Ref Range Status   Specimen Description BLOOD LEFT HAND  Final   Special Requests   Final    BOTTLES DRAWN AEROBIC AND ANAEROBIC Blood Culture adequate volume   Culture   Final    NO GROWTH 3 DAYS Performed at [facility], [address] [place], [place] [address]    Report Status PENDING  Incomplete  MRSA PCR Screening     Status: None   Collection Time: 03/29/17 11:56 AM  Result Value Ref Range Status   MRSA by PCR NEGATIVE NEGATIVE Final    Comment:        The  GeneXpert MRSA Assay (FDA approved for NASAL specimens only), is one component of a comprehensive MRSA colonization surveillance program. It is not intended to diagnose MRSA infection nor to guide or monitor treatment for MRSA infections. Performed at [facility], [address] [place], [place] [address]          Radiology Studies: Dg Chest Port 1 View  Result Date: 04/02/2017 CLINICAL DATA:  Shortness of breath. EXAM: PORTABLE CHEST 1 VIEW COMPARISON:  04/01/2017. FINDINGS: Mediastinum and hilar structures normal. Heart size normal. No focal infiltrate. No pleural effusion or pneumothorax. IMPRESSION: No  acute cardiopulmonary disease. Electronically Signed   By: Maisie Fus  Register   On: 04/02/2017 08:00   Dg Chest Port 1 View  Result Date: 04/01/2017 CLINICAL DATA:  Shortness of Breath EXAM: PORTABLE CHEST 1 VIEW COMPARISON:  03/31/2017 FINDINGS: Cardiac shadow is within normal limits. Aortic calcifications are again seen. Lungs are hyperinflated without focal infiltrate or sizable effusion. Minimal changes are noted in the left lateral costophrenic angle likely related atelectasis as they were not present on the previous exam. No bony abnormality is noted. IMPRESSION: Minimal left basilar atelectasis. Electronically Signed   By: Alcide Clever M.D.   On: 04/01/2017 08:36        Scheduled Meds: . diltiazem  240 mg Oral Daily  . folic acid  1 mg Oral Daily  . furosemide  40 mg Oral Daily  . levalbuterol  0.63 mg Nebulization TID  . losartan  25 mg Oral Daily  . mouth rinse  15 mL Mouth Rinse BID  . metoprolol succinate  50 mg Oral Daily  . multivitamin with minerals  1 tablet Oral Daily  . pantoprazole  40 mg Oral Q1200  . predniSONE  60 mg Oral Q breakfast  . thiamine  100 mg Oral Daily   Continuous Infusions: . sodium chloride    . azithromycin 500 mg (04/02/17 1015)  . cefTRIAXone (ROCEPHIN)  IV Stopped (04/01/17 1333)  . diltiazem (CARDIZEM) infusion Stopped  (03/31/17 1545)  . heparin 1,400 Units/hr (04/02/17 0600)     LOS: 4 days     Marcellus Scott, MD, FACP, [facility]. [facility] Pager [phone]  If 7PM-7AM, please contact night-coverage [email or web address] Password [facility] 04/02/2017, 11:21 AM

## 2017-04-03 DIAGNOSIS — I10 Essential (primary) hypertension: Secondary | ICD-10-CM

## 2017-04-03 DIAGNOSIS — I4891 Unspecified atrial fibrillation: Secondary | ICD-10-CM | POA: Diagnosis not present

## 2017-04-03 DIAGNOSIS — J9621 Acute and chronic respiratory failure with hypoxia: Secondary | ICD-10-CM

## 2017-04-03 DIAGNOSIS — J441 Chronic obstructive pulmonary disease with (acute) exacerbation: Secondary | ICD-10-CM | POA: Diagnosis not present

## 2017-04-03 DIAGNOSIS — J9622 Acute and chronic respiratory failure with hypercapnia: Secondary | ICD-10-CM

## 2017-04-03 LAB — CULTURE, BLOOD (ROUTINE X 2)
Culture: NO GROWTH
Culture: NO GROWTH
Special Requests: ADEQUATE
Special Requests: ADEQUATE

## 2017-04-03 LAB — BASIC METABOLIC PANEL
Anion gap: 12 (ref 5–15)
BUN: 17 mg/dL (ref 6–20)
CO2: 32 mmol/L (ref 22–32)
Calcium: 8.7 mg/dL — ABNORMAL LOW (ref 8.9–10.3)
Chloride: 94 mmol/L — ABNORMAL LOW (ref 101–111)
Creatinine, Ser: 0.94 mg/dL (ref 0.61–1.24)
GFR calc Af Amer: >60 mL/min (ref >60)
GFR calc non Af Amer: >60 mL/min (ref >60)
Glucose, Bld: 132 mg/dL — ABNORMAL HIGH (ref 65–99)
Potassium: 4.4 mmol/L (ref 3.5–5.1)
Sodium: 138 mmol/L (ref 135–145)

## 2017-04-03 MED ORDER — ADULT MULTIVITAMIN W/MINERALS CH: 1.0000 | ORAL_TABLET | Freq: Every day | ORAL | Status: DC

## 2017-04-03 MED ORDER — FOLIC ACID 1 MG PO TABS: 1.0000 mg | ORAL_TABLET | Freq: Every day | ORAL | 0 refills | Status: DC

## 2017-04-03 MED ORDER — LOSARTAN POTASSIUM 25 MG PO TABS: 25.0000 mg | ORAL_TABLET | Freq: Every day | ORAL | 0 refills | Status: DC

## 2017-04-03 MED ORDER — FUROSEMIDE 40 MG PO TABS: 40.0000 mg | ORAL_TABLET | Freq: Every day | ORAL | 0 refills | Status: DC

## 2017-04-03 MED ORDER — MOMETASONE FURO-FORMOTEROL FUM 200-5 MCG/ACT IN AERO: 2.0000 | INHALATION_SPRAY | Freq: Two times a day (BID) | RESPIRATORY_TRACT | 0 refills | Status: DC

## 2017-04-03 MED ORDER — PREDNISONE 10 MG PO TABS: ORAL_TABLET | ORAL | 0 refills | Status: DC

## 2017-04-03 MED ORDER — DABIGATRAN ETEXILATE MESYLATE 150 MG PO CAPS: 150.0000 mg | ORAL_CAPSULE | Freq: Two times a day (BID) | ORAL | 0 refills | Status: DC

## 2017-04-03 MED ORDER — DILTIAZEM HCL ER COATED BEADS 240 MG PO CP24: 240.0000 mg | ORAL_CAPSULE | Freq: Every day | ORAL | 0 refills | Status: DC

## 2017-04-03 MED ORDER — METOPROLOL SUCCINATE ER 50 MG PO TB24: 50.0000 mg | ORAL_TABLET | Freq: Every day | ORAL | 0 refills | Status: DC

## 2017-04-03 MED ORDER — ALBUTEROL SULFATE HFA 108 (90 BASE) MCG/ACT IN AERS: 1.0000 | INHALATION_SPRAY | Freq: Four times a day (QID) | RESPIRATORY_TRACT | 0 refills | Status: DC | PRN

## 2017-04-03 MED ORDER — THIAMINE HCL 100 MG PO TABS: 100.0000 mg | ORAL_TABLET | Freq: Every day | ORAL | 0 refills | Status: DC

## 2017-04-03 MED ORDER — TIOTROPIUM BROMIDE MONOHYDRATE 18 MCG IN CAPS: 1.0000 | ORAL_CAPSULE | Freq: Every day | RESPIRATORY_TRACT | 0 refills | Status: DC

## 2017-04-03 NOTE — Progress Notes (Signed)
Order received to discharge patient.  Telemetry monitor removed and CCMD notified.  PIV access removed.  Discharge instructions, follow up, medications and instructions for their use discussed with patient. Patient was encouraged to wait until CM could have O2 delivered for his ride home.  Patient refused to wait.

## 2017-04-03 NOTE — Care Management Note (Signed)
Case Management Note  Patient Details  Name: Johnny Rodriguez MRN: [id number] Date of Birth: 27-Jun-1950  Subjective/Objective:                 Patient active w [facility], resumption orders placed. No other CM needs.    Action/Plan:   Expected Discharge Date:  04/03/17               Expected Discharge Plan:  Home w Home Health Services  In-House Referral:     Discharge planning Services  CM Consult  Post Acute Care Choice:  Home Health Choice offered to:  Patient  DME Arranged:    DME Agency:     HH Arranged:  RN, Social Work, Nurse's Aide HH Agency:     Status of Service:  Completed, signed off  If discussed at [employer], dates discussed:    Additional Comments:  Lawerance Sabal, RN 04/03/2017, 11:16 AM

## 2017-04-03 NOTE — Discharge Summary (Signed)
Physician Discharge Summary  Johnny Rodriguez XMD:[id number] DOB: 08/03/1950  PCP: Gwenyth Bender, MD  Admit date: 03/29/2017 Discharge date: 04/03/2017  Recommendations for Outpatient Follow-up:  1. Dr. Willey Blade, PCP in 1 week with repeat labs (CBC & BMP). 2. Dr. Jaymes Graff, Cardiology in 2 weeks.  Home Health: None Equipment/Devices: None  Discharge Condition: Improved and stable CODE STATUS: DNR Diet recommendation: Heart healthy diet.  Discharge Diagnoses:  Active Problems:   COPD exacerbation [facility])   Brief Summary: 67 year old single male, currently living in a motel, PMH of atrial fibrillation, alcohol abuse, CAD, chronic diastolic CHF, severe COPD presented with dyspnea and admitted for COPD exacerbation.  Admitted by CCM to ICU for BiPAP.  Cardiology consulted for A. fib with RVR and was on Cardizem drip.  Briefly on Precedex drip.  Improved/stabilized and transferred to stepdown and TRH care on 2/21.   Assessment & Plan:    1. COPD exacerbation: Clinically improved.  Has been transitioned to oral prednisone 60 mg daily.  Completed 6 of antibiotics and discontinued 2/21.  Chest x-ray 2/21 without acute findings.  Tobacco cessation counseled.  Flutter valve added.  Prednisone taper at discharge and provided refills for his inhalers.  2. Tobacco abuse: Smoked a pack per day.  Cessation counseled. 3. Acute hypercapnic & hypoxic respiratory failure: Secondary to COPD exacerbation.  Off of BiPAP.    At baseline patient uses home oxygen 3 L/min continuously.  As per desaturation evaluation today, back on home level of oxygen. 4. Persistent atrial fibrillation with RVR/SVT: Briefly on IV Cardizem drip, now off.  Continue oral Cardizem 240 daily (was increased from 180-240), metoprolol XL 50 mg daily.    Transitioned from IV heparin to prior home Pradaxa.  Cardiology assisted with management.  Discussed with Cardiology today who indicated that patient had been noncompliant  with his medications at home and recommend continuing Pradaxa but no aspirin at discharge.  Outpatient follow-up with primary Cardiologist. 5. Chronic diastolic CHF: Compensated.  Continue Lasix. 6. Essential hypertension: Controlled.  Continue Cardizem, Toprol and Cozaar. 7. Hyperlipidemia 8. Noncompliance: Patient was counseled extensively regarding importance of compliance with all aspects of medical care.  He verbalized understanding.   Consultants:  CCM-signed off Cardiology  Procedures:  BiPAP   Discharge Instructions  Discharge Instructions    (HEART FAILURE PATIENTS) Call MD:  Anytime you have any of the following symptoms: 1) 3 pound weight gain in 24 hours or 5 pounds in 1 week 2) shortness of breath, with or without a dry hacking cough 3) swelling in the hands, feet or stomach 4) if you have to sleep on extra pillows at night in order to breathe.   Complete by:  As directed    Call MD for:  difficulty breathing, headache or visual disturbances   Complete by:  As directed    Call MD for:  extreme fatigue   Complete by:  As directed    Call MD for:  persistant dizziness or light-headedness   Complete by:  As directed    Call MD for:  temperature >100.4   Complete by:  As directed    Diet - low sodium heart healthy   Complete by:  As directed    Increase activity slowly   Complete by:  As directed        Medication List    STOP taking these medications   aspirin EC 81 MG tablet   spironolactone 25 MG tablet Commonly known as:  ALDACTONE  TAKE these medications   albuterol 108 (90 Base) MCG/ACT inhaler Commonly known as:  PROAIR HFA Inhale 1-2 puffs into the lungs every 6 (six) hours as needed for wheezing or shortness of breath. What changed:    how much to take  reasons to take this   dabigatran 150 MG Caps capsule Commonly known as:  PRADAXA Take 1 capsule (150 mg total) by mouth 2 (two) times daily. Medicaid [id number] O What changed:  when to  take this   diltiazem 240 MG 24 hr capsule Commonly known as:  CARDIZEM CD Take 1 capsule (240 mg total) by mouth daily. Medicaid [id number] O What changed:    medication strength  how much to take   folic acid 1 MG tablet Commonly known as:  FOLVITE Take 1 tablet (1 mg total) by mouth daily. Start taking on:  04/04/2017   furosemide 40 MG tablet Commonly known as:  LASIX Take 1 tablet (40 mg total) by mouth daily. Medicaid [id number] O   losartan 25 MG tablet Commonly known as:  COZAAR Take 1 tablet (25 mg total) by mouth daily. Medicaid [id number] O   metoprolol succinate 50 MG 24 hr tablet Commonly known as:  TOPROL-XL Take 1 tablet (50 mg total) by mouth daily. Take with or immediately following a meal. Medicaid [id number] O   mometasone-formoterol 200-5 MCG/ACT Aero Commonly known as:  DULERA Inhale 2 puffs into the lungs 2 (two) times daily. Medicaid [id number] O   multivitamin with minerals Tabs tablet Take 1 tablet by mouth daily. Start taking on:  04/04/2017   OXYGEN 3lpm 24/7  [facility]   predniSONE 10 MG tablet Commonly known as:  DELTASONE Take 5 tabs daily for 3 days, then 4 tabs daily for 3 days, then 3 tabs daily for 3 days, then 2 tabs daily for 3 days, then 1 tab daily for 3 days, then stop.   thiamine 100 MG tablet Take 1 tablet (100 mg total) by mouth daily. Start taking on:  04/04/2017   tiotropium 18 MCG inhalation capsule Commonly known as:  SPIRIVA Place 1 capsule (18 mcg total) into inhaler and inhale daily.   Vitamin D2 2000 units Tabs Take 1 tablet by mouth daily.      Follow-up Information    Gwenyth Bender, MD. Schedule an appointment as soon as possible for a visit in 1 week(s).   Specialty:  Internal Medicine Why:  To be seen with repeat labs (CBC & BMP). Contact information: [address] [place] [address] [phone]        Othella Boyer, MD. Schedule an appointment as soon as possible for a visit in 2 week(s).    Specialty:  Cardiology Contact information: [address] [place] [address] [place] [address] [phone]          No Known Allergies    Procedures/Studies: Dg Chest Port 1 View  Result Date: 04/02/2017 CLINICAL DATA:  Shortness of breath. EXAM: PORTABLE CHEST 1 VIEW COMPARISON:  04/01/2017. FINDINGS: Mediastinum and hilar structures normal. Heart size normal. No focal infiltrate. No pleural effusion or pneumothorax. IMPRESSION: No acute cardiopulmonary disease. Electronically Signed   By: Maisie Fus  Register   On: 04/02/2017 08:00   Dg Chest Port 1 View  Result Date: 04/01/2017 CLINICAL DATA:  Shortness of Breath EXAM: PORTABLE CHEST 1 VIEW COMPARISON:  03/31/2017 FINDINGS: Cardiac shadow is within normal limits. Aortic calcifications are again seen. Lungs are hyperinflated without focal infiltrate or sizable effusion. Minimal changes are noted in the left lateral costophrenic angle  likely related atelectasis as they were not present on the previous exam. No bony abnormality is noted. IMPRESSION: Minimal left basilar atelectasis. Electronically Signed   By: Alcide Clever M.D.   On: 04/01/2017 08:36   Dg Chest Port 1 View  Result Date: 03/31/2017 CLINICAL DATA:  Acute respiratory failure EXAM: PORTABLE CHEST 1 VIEW COMPARISON:  03/29/2017 FINDINGS: Mild hyperinflation. Heart is borderline in size. No confluent airspace opacities or effusions. No acute bony abnormality. IMPRESSION: Mild hyperinflation.  No active disease. Electronically Signed   By: Charlett Nose M.D.   On: 03/31/2017 08:51   Dg Chest Port 1 View  Result Date: 03/29/2017 CLINICAL DATA:  Shortness of breath. EXAM: PORTABLE CHEST 1 VIEW COMPARISON:  01/23/2017 FINDINGS: Mild chronic hyperinflation. Increased peribronchial thickening from prior exam. Heart is normal in size. Aortic arch atherosclerosis. No confluent consolidation. No pleural effusion or pneumothorax. No acute osseous abnormalities. IMPRESSION: Chronic  hyperinflation. Increased bronchial thickening from prior exam suspicious for COPD exacerbation/acute bronchitis. Electronically Signed   By: Rubye Oaks M.D.   On: 03/29/2017 03:50      Subjective: States that he has continued to improve.  Indicates that his breathing is almost back to baseline and is anxious to go home due to fear of losing his motel room.  Denies cough, chest pain, fever or chills.  As per RN, no acute issues reported.  Ambulated with RN.  Discharge Exam:  Vitals:   04/02/17 2000 04/03/17 0514 04/03/17 0734 04/03/17 0800  BP: (!) 125/92 129/88  (!) 121/91  Pulse: 92 80  97  Resp: (!) 30 (!) 25    Temp: (!) 97.5 F (36.4 C) (!) 97.5 F (36.4 C)  97.9 F (36.6 C)  TempSrc: Oral Oral  Oral  SpO2: 92% 97% 94% 96%  Weight:  76.3 kg (168 lb 4.8 oz)    Height:        General exam: Pleasant middle-aged male, moderately built and nourished sitting up comfortably in bed eating breakfast this morning. Respiratory system:  Distant breath sounds but clear to auscultation without wheezing, rhonchi or crackles.  No increased work of breathing.  Able to speak in full sentences. Cardiovascular system: S1 & S2 heard, RRR. No JVD, murmurs, rubs, gallops or clicks. No pedal edema.  Telemetry personally reviewed: A. fib with controlled ventricular rate.  A single pause of 2.34 seconds noted this morning at 3:08 AM. Gastrointestinal system: Abdomen is nondistended, soft and nontender. No organomegaly or masses felt. Normal bowel sounds heard. Central nervous system: Alert and oriented. No focal neurological deficits. Extremities: Symmetric 5 x 5 power. Skin: No rashes, lesions or ulcers Psychiatry: Judgement and insight appear normal. Mood & affect appropriate.       The results of significant diagnostics from this hospitalization (including imaging, microbiology, ancillary and laboratory) are listed below for reference.     Microbiology: Recent Results (from the past 240  hour(s))  Culture, blood (Routine X 2) w Reflex to ID Panel     Status: None (Preliminary result)   Collection Time: 03/29/17  3:30 AM  Result Value Ref Range Status   Specimen Description BLOOD RIGHT HAND  Final   Special Requests   Final    BOTTLES DRAWN AEROBIC AND ANAEROBIC Blood Culture adequate volume   Culture   Final    NO GROWTH 4 DAYS Performed at [facility], [address] [place], [place] [address]    Report Status PENDING  Incomplete  Culture, blood (Routine X  2) w Reflex to ID Panel     Status: None (Preliminary result)   Collection Time: 03/29/17  3:40 AM  Result Value Ref Range Status   Specimen Description BLOOD LEFT HAND  Final   Special Requests   Final    BOTTLES DRAWN AEROBIC AND ANAEROBIC Blood Culture adequate volume   Culture   Final    NO GROWTH 4 DAYS Performed at [facility], [address] [place], [place] [address]    Report Status PENDING  Incomplete  MRSA PCR Screening     Status: None   Collection Time: 03/29/17 11:56 AM  Result Value Ref Range Status   MRSA by PCR NEGATIVE NEGATIVE Final    Comment:        The GeneXpert MRSA Assay (FDA approved for NASAL specimens only), is one component of a comprehensive MRSA colonization surveillance program. It is not intended to diagnose MRSA infection nor to guide or monitor treatment for MRSA infections. Performed at [facility], [address] [place], [place] [address]      Labs: CBC: Recent Labs  Lab 03/29/17 0300 03/29/17 1036 03/30/17 0249 03/31/17 0444 04/01/17 0842 04/02/17 0332  WBC 12.3* 8.0 7.6 11.2* 11.1* 13.0*  NEUTROABS 8.6*  --  6.7  --   --   --   HGB 14.9 12.8* 11.0* 11.4* 13.1 12.7*  HCT 43.6 38.8* 34.0* 34.6* 40.5 37.5*  MCV 94.4 94.9 95.0 96.1 95.7 94.5  PLT 234 201 187 240 273 303   Basic Metabolic Panel: Recent Labs  Lab 03/30/17 0249 03/31/17 0444 04/01/17 0842 04/02/17 0332 04/03/17 0406  NA 140 138 139 138 138  K 4.2 4.2 4.0 3.8  4.4  CL 106 102 97* 95* 94*  CO2 24 24 27 31  32  GLUCOSE 167* 167* 164* 156* 132*  BUN 15 27* 20 21* 17  CREATININE 0.83 0.96 0.80 0.87 0.94  CALCIUM 8.7* 8.8* 8.9 8.5* 8.7*  MG 2.3 2.2 2.2 2.2  --   PHOS 2.5 3.0 2.9 3.4  --    Liver Function Tests: Recent Labs  Lab 03/29/17 0300  AST 36  ALT 12*  ALKPHOS 67  BILITOT 1.7*  PROT 7.6  ALBUMIN 3.7   BNP (last 3 results) Recent Labs    05/19/16 1828 01/23/17 0836  BNP 5.2 127.7*   Cardiac Enzymes: Recent Labs  Lab 03/30/17 1937 03/30/17 2304 03/31/17 0444  TROPONINI 0.03* <0.03 <0.03       Time coordinating discharge: Over 30 minutes  SIGNED:  Marcellus Scott, MD, FACP, [facility]. [facility] Pager [phone]  If 7PM-7AM, please contact night-coverage [email or web address] Password [facility] 04/03/2017, 10:48 AM

## 2017-04-03 NOTE — Discharge Instructions (Addendum)
Please get your medications reviewed and adjusted by your Primary MD. ° °Please request your Primary MD to go over all Hospital Tests and Procedure/Radiological results at the follow up, please get all Hospital records sent to your Prim MD by signing hospital release before you go home. ° °If you had Pneumonia of Lung problems at the Hospital: °Please get a 2 view Chest X ray done in 6-8 weeks after hospital discharge or sooner if instructed by your Primary MD. ° °If you have Congestive Heart Failure: °Please call your Cardiologist or Primary MD anytime you have any of the following symptoms:  °1) 3 pound weight gain in 24 hours or 5 pounds in 1 week  °2) shortness of breath, with or without a dry hacking cough  °3) swelling in the hands, feet or stomach  °4) if you have to sleep on extra pillows at night in order to breathe ° °Follow cardiac low salt diet and 1.5 lit/day fluid restriction. ° °If you have diabetes °Accuchecks 4 times/day, Once in AM empty stomach and then before each meal. °Log in all results and show them to your primary doctor at your next visit. °If any glucose reading is under 80 or above 300 call your primary MD immediately. ° °If you have Seizure/Convulsions/Epilepsy: °Please do not drive, operate heavy machinery, participate in activities at heights or participate in high speed sports until you have seen by Primary MD or a Neurologist and advised to do so again. ° °If you had Gastrointestinal Bleeding: °Please ask your Primary MD to check a complete blood count within one week of discharge or at your next visit. Your endoscopic/colonoscopic biopsies that are pending at the time of discharge, will also need to followed by your Primary MD. ° °Get Medicines reviewed and adjusted. °Please take all your medications with you for your next visit with your Primary MD ° °Please request your Primary MD to go over all hospital tests and procedure/radiological results at the follow up, please ask your  Primary MD to get all Hospital records sent to his/her office. ° °If you experience worsening of your admission symptoms, develop shortness of breath, life threatening emergency, suicidal or homicidal thoughts you must seek medical attention immediately by calling 911 or calling your MD immediately  if symptoms less severe. ° °You must read complete instructions/literature along with all the possible adverse reactions/side effects for all the Medicines you take and that have been prescribed to you. Take any new Medicines after you have completely understood and accpet all the possible adverse reactions/side effects.  ° °Do not drive or operate heavy machinery when taking Pain medications.  ° °Do not take more than prescribed Pain, Sleep and Anxiety Medications ° °Special Instructions: If you have smoked or chewed Tobacco  in the last 2 yrs please stop smoking, stop any regular Alcohol  and or any Recreational drug use. ° °Wear Seat belts while driving. ° °Please note °You were cared for by a hospitalist during your hospital stay. If you have any questions about your discharge medications or the care you received while you were in the hospital after you are discharged, you can call the unit and asked to speak with the hospitalist on call if the hospitalist that took care of you is not available. Once you are discharged, your primary care physician will handle any further medical issues. Please note that NO REFILLS for any discharge medications will be authorized once you are discharged, as it is imperative that you   return to your primary care physician (or establish a relationship with a primary care physician if you do not have one) for your aftercare needs so that they can reassess your need for medications and monitor your lab values.  You can reach the hospitalist office at phone [phone] or fax [phone]   If you do not have a primary care physician, you can call [phone] for a physician  referral.   Chronic Obstructive Pulmonary Disease Exacerbation Chronic obstructive pulmonary disease (COPD) is a common lung problem. In COPD, the flow of air from the lungs is limited. COPD exacerbations are times that breathing gets worse and you need extra treatment. Without treatment they can be life threatening. If they happen often, your lungs can become more damaged. If your COPD gets worse, your doctor may treat you with:  Medicines.  Oxygen.  Different ways to clear your airway, such as using a mask.  Follow these instructions at home:  Do not smoke.  Avoid tobacco smoke and other things that bother your lungs.  If given, take your antibiotic medicine as told. Finish the medicine even if you start to feel better.  Only take medicines as told by your doctor.  Drink enough fluids to keep your pee (urine) clear or pale yellow (unless your doctor has told you not to).  Use a cool mist machine (vaporizer).  If you use oxygen or a machine that turns liquid medicine into a mist (nebulizer), continue to use them as told.  Keep up with shots (vaccinations) as told by your doctor.  Exercise regularly.  Eat healthy foods.  Keep all doctor visits as told. Get help right away if:  You are very short of breath and it gets worse.  You have trouble talking.  You have bad chest pain.  You have blood in your spit (sputum).  You have a fever.  You keep throwing up (vomiting).  You feel weak, or you pass out (faint).  You feel confused.  You keep getting worse. This information is not intended to replace advice given to you by your health care provider. Make sure you discuss any questions you have with your health care provider. Document Released: 01/16/2011 Document Revised: 07/05/2015 Document Reviewed: 10/01/2012 Elsevier Interactive Patient Education  2017 [employer]  Inc.  =======================================================================================  Information on my medicine - Pradaxa (dabigatran)  Why was Pradaxa prescribed for you? Pradaxa was prescribed for you to reduce the risk of forming blood clots that cause a stroke if you have a medical condition called atrial fibrillation (a type of irregular heartbeat).    What do you Need to know about PradAXa? Take your Pradaxa TWICE DAILY - one capsule in the morning and one tablet in the evening with or without food.  It would be best to take the doses about the same time each day.  The capsules should not be broken, chewed or opened - they must be swallowed whole.  Do not store Pradaxa in other medication containers - once the bottle is opened the Pradaxa should be used within FOUR months; throw away any capsules that havent been by that time.  Take Pradaxa exactly as prescribed by your doctor.  DO NOT stop taking Pradaxa without talking to the doctor who prescribed the medication.  Stopping without other stroke prevention medication to take the place of Pradaxa may increase your risk of developing a clot that causes a stroke.  Refill your prescription before you run out.  After discharge, you should have regular  check-up appointments with your healthcare provider that is prescribing your Pradaxa.  In the future your dose may need to be changed if your kidney function or weight changes by a significant amount.  What do you do if you miss a dose? If you miss a dose, take it as soon as you remember on the same day.  If your next dose is less than 6 hours away, skip the missed dose.  Do not take two doses of PRADAXA at the same time.  Important Safety Information A possible side effect of Pradaxa is bleeding. You should call your healthcare provider right away if you experience any of the following: ? Bleeding from an injury or your nose that does not stop. ? Unusual colored urine  (red or dark brown) or unusual colored stools (red or black). ? Unusual bruising for unknown reasons. ? A serious fall or if you hit your head (even if there is no bleeding).  Some medicines may interact with Pradaxa and might increase your risk of bleeding or clotting while on Pradaxa. To help avoid this, consult your healthcare provider or pharmacist prior to using any new prescription or non-prescription medications, including herbals, vitamins, non-steroidal anti-inflammatory drugs (NSAIDs) and supplements.  This website has more information on Pradaxa (dabigatran): https://www.pradaxa.com

## 2017-04-03 NOTE — Progress Notes (Signed)
SATURATION QUALIFICATIONS: (This note is used to comply with regulatory documentation for home oxygen)  Patient Saturations on Room Air at Rest = 86%  Patient Saturations on Room Air while Ambulating = 82%  Patient Saturations on 3 Liters of oxygen while Ambulating = 92%  Please briefly explain why patient needs home oxygen: Patient ambulated approximately 650 feet in hallway with saturations maintaining in the low 90's on 3 Liters of O2 nasal cannula

## 2017-09-28 IMAGING — CR DG SHOULDER 2+V*L*
3 series · 3 of 3 positions shown · non-contrast
Comparison: Radiograph dated 02/08/2015

CLINICAL DATA: A 65-year-old male with left shoulder pain.

EXAM:
LEFT SHOULDER - 2+ VIEW

[w shoulder external left]
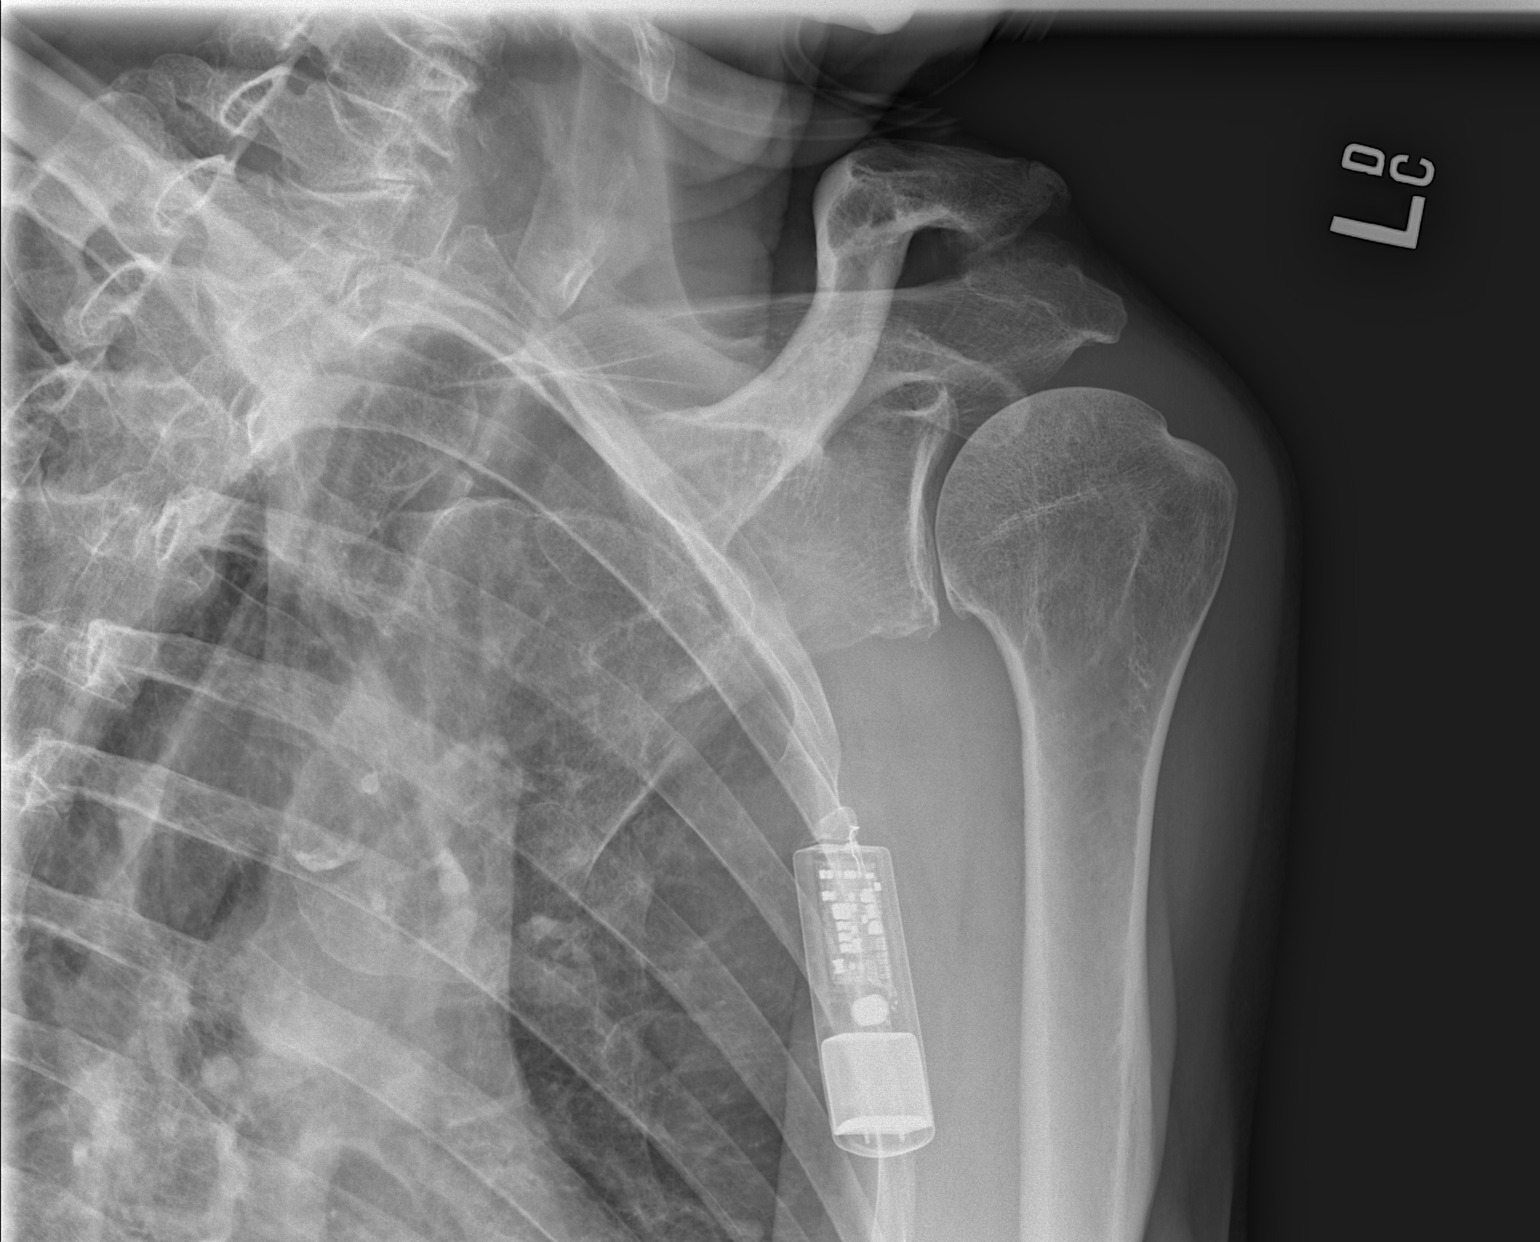

[w shoulder y-view left]
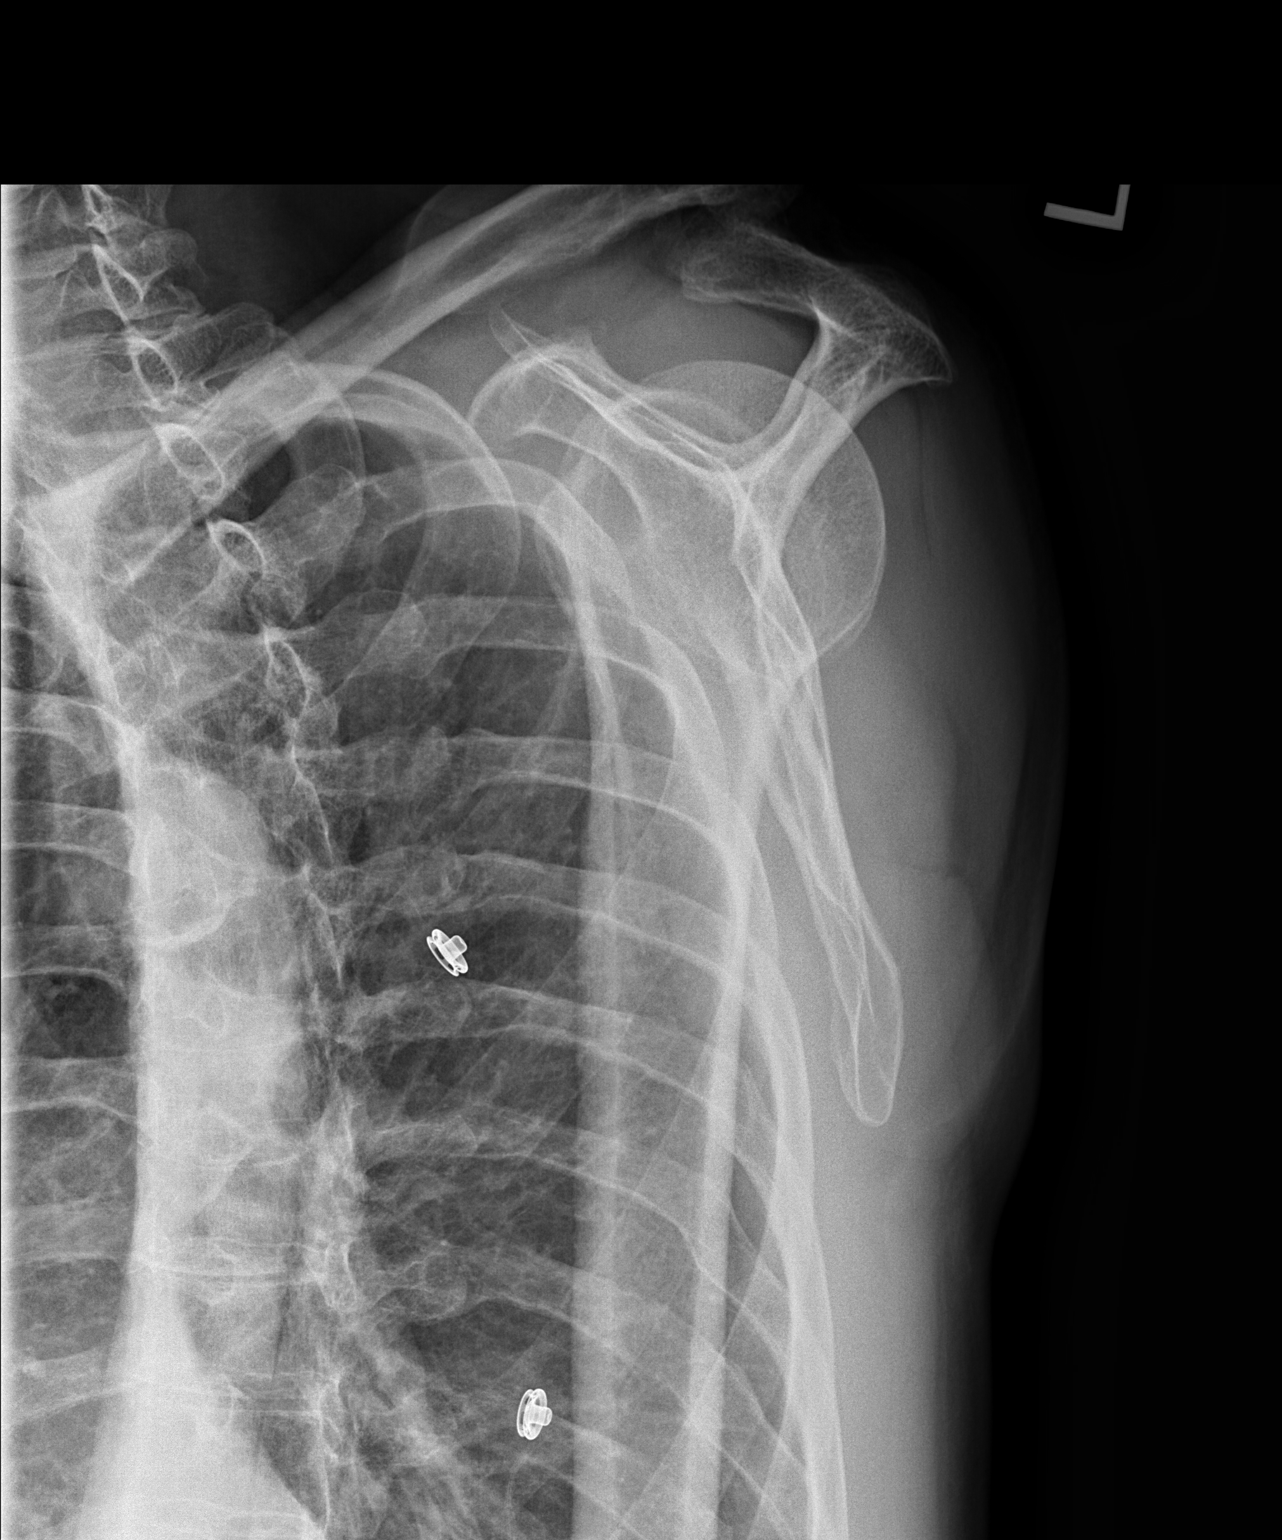

[x shoulder axillary left]
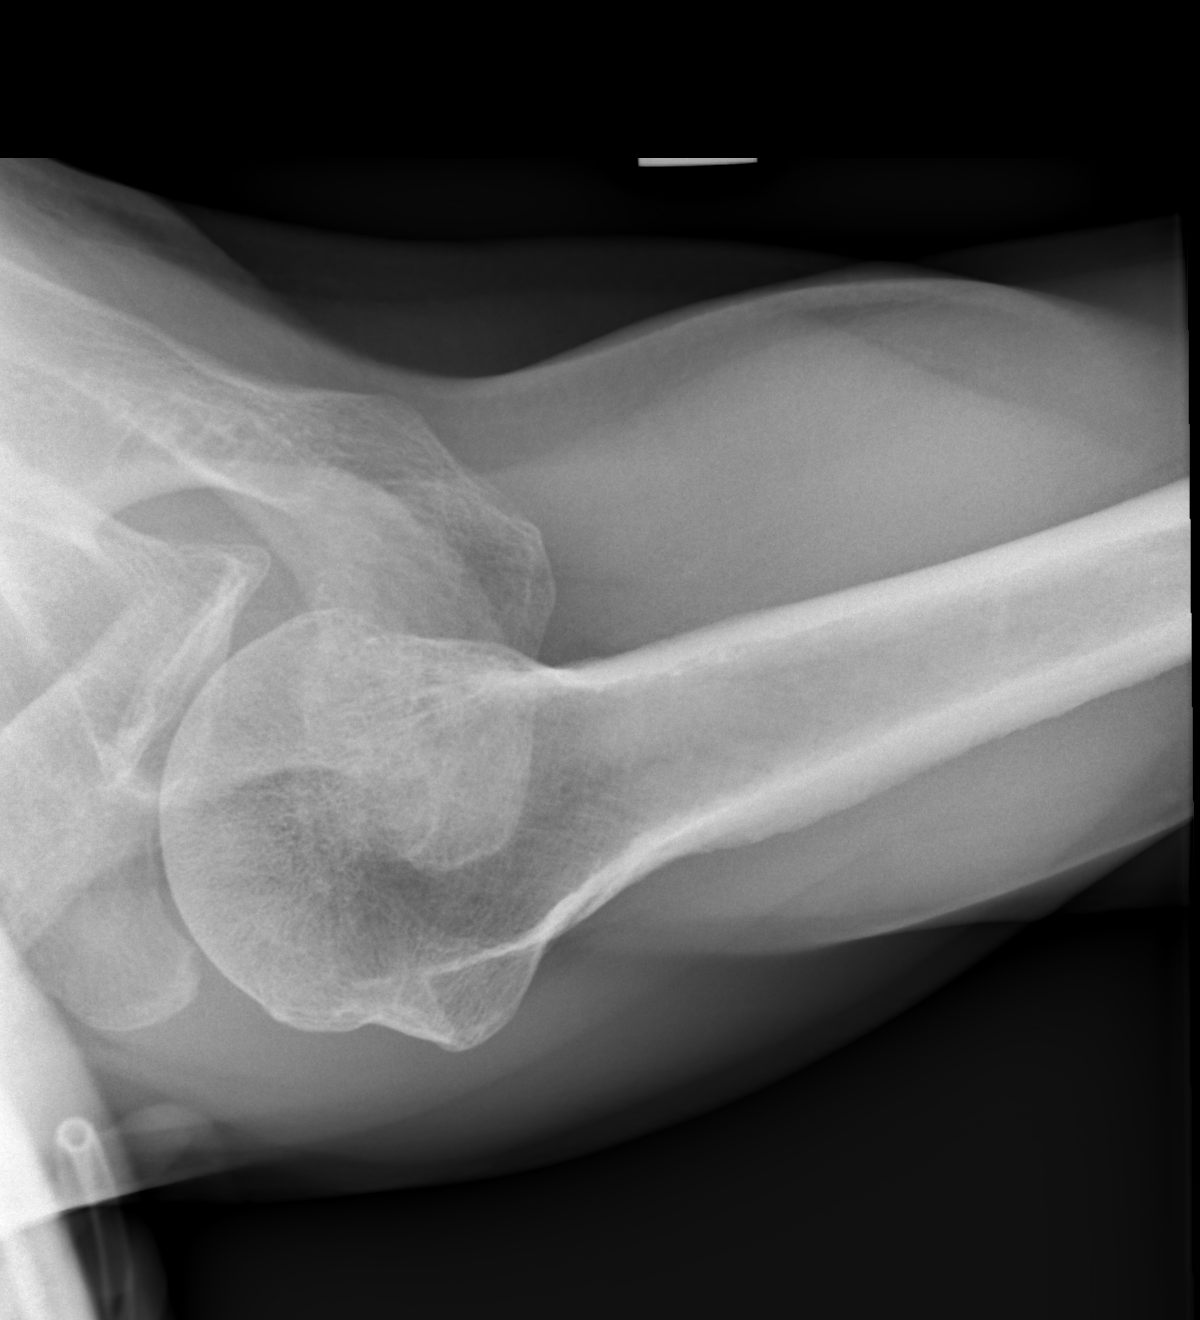

[3 of 3 positions shown; findings below may reference images not displayed]

FINDINGS: There is no acute fracture or dislocation. The bones are osteopenic.
There is degenerative changes of the left AC joint with bone
spurring. The soft tissues appear unremarkable.
IMPRESSION: No acute fracture or dislocation.

## 2019-02-08 ENCOUNTER — Encounter (HOSPITAL_COMMUNITY): Payer: Self-pay | Admitting: Emergency Medicine

## 2019-02-08 ENCOUNTER — Emergency Department (HOSPITAL_COMMUNITY)
Admission: EM | Admit: 2019-02-08 | Discharge: 2019-02-09 | Disposition: A | Discharge status: Home / Self Care | Payer: Medicare Other | Attending: Emergency Medicine | Admitting: Emergency Medicine

## 2019-02-08 ENCOUNTER — Emergency Department (HOSPITAL_COMMUNITY): Payer: Medicare Other

## 2019-02-08 DIAGNOSIS — R0789 Other chest pain: Secondary | ICD-10-CM | POA: Diagnosis not present

## 2019-02-08 DIAGNOSIS — J449 Chronic obstructive pulmonary disease, unspecified: Secondary | ICD-10-CM | POA: Diagnosis not present

## 2019-02-08 DIAGNOSIS — Y939 Activity, unspecified: Secondary | ICD-10-CM | POA: Insufficient documentation

## 2019-02-08 DIAGNOSIS — Y999 Unspecified external cause status: Secondary | ICD-10-CM | POA: Diagnosis not present

## 2019-02-08 DIAGNOSIS — F17200 Nicotine dependence, unspecified, uncomplicated: Secondary | ICD-10-CM | POA: Insufficient documentation

## 2019-02-08 DIAGNOSIS — I4891 Unspecified atrial fibrillation: Secondary | ICD-10-CM | POA: Diagnosis not present

## 2019-02-08 DIAGNOSIS — Y929 Unspecified place or not applicable: Secondary | ICD-10-CM | POA: Insufficient documentation

## 2019-02-08 DIAGNOSIS — S3991XA Unspecified injury of abdomen, initial encounter: Secondary | ICD-10-CM | POA: Diagnosis present

## 2019-02-08 DIAGNOSIS — I2510 Atherosclerotic heart disease of native coronary artery without angina pectoris: Secondary | ICD-10-CM | POA: Diagnosis not present

## 2019-02-08 DIAGNOSIS — I110 Hypertensive heart disease with heart failure: Secondary | ICD-10-CM | POA: Insufficient documentation

## 2019-02-08 DIAGNOSIS — S301XXA Contusion of abdominal wall, initial encounter: Secondary | ICD-10-CM | POA: Insufficient documentation

## 2019-02-08 DIAGNOSIS — Z79899 Other long term (current) drug therapy: Secondary | ICD-10-CM | POA: Insufficient documentation

## 2019-02-08 DIAGNOSIS — I5022 Chronic systolic (congestive) heart failure: Secondary | ICD-10-CM | POA: Diagnosis not present

## 2019-02-08 DIAGNOSIS — W010XXA Fall on same level from slipping, tripping and stumbling without subsequent striking against object, initial encounter: Secondary | ICD-10-CM | POA: Diagnosis not present

## 2019-02-08 DIAGNOSIS — I10 Essential (primary) hypertension: Secondary | ICD-10-CM

## 2019-02-08 DIAGNOSIS — W19XXXA Unspecified fall, initial encounter: Secondary | ICD-10-CM

## 2019-02-08 DIAGNOSIS — J441 Chronic obstructive pulmonary disease with (acute) exacerbation: Secondary | ICD-10-CM

## 2019-02-08 DIAGNOSIS — I480 Paroxysmal atrial fibrillation: Secondary | ICD-10-CM

## 2019-02-08 NOTE — ED Triage Notes (Signed)
Pt her e with c/o right side rib pain after a fall 2 days ago , pt tripped over his O2 tubing , pt has copd and is on 3 liters otc , pt has several chronic conditions that he said he doesn't follow up with anymore ( copd,htn afib )

## 2019-02-09 ENCOUNTER — Emergency Department (HOSPITAL_COMMUNITY): Payer: Medicare Other

## 2019-02-09 ENCOUNTER — Telehealth (HOSPITAL_COMMUNITY): Payer: Self-pay

## 2019-02-09 ENCOUNTER — Other Ambulatory Visit: Payer: Self-pay

## 2019-02-09 DIAGNOSIS — S301XXA Contusion of abdominal wall, initial encounter: Secondary | ICD-10-CM | POA: Diagnosis not present

## 2019-02-09 LAB — COMPREHENSIVE METABOLIC PANEL
ALT: 25 U/L (ref 0–44)
AST: 31 U/L (ref 15–41)
Albumin: 3.9 g/dL (ref 3.5–5.0)
Alkaline Phosphatase: 68 U/L (ref 38–126)
Anion gap: 10 (ref 5–15)
BUN: 18 mg/dL (ref 8–23)
CO2: 30 mmol/L (ref 22–32)
Calcium: 9.6 mg/dL (ref 8.9–10.3)
Chloride: 101 mmol/L (ref 98–111)
Creatinine, Ser: 1.3 mg/dL — ABNORMAL HIGH (ref 0.61–1.24)
GFR calc Af Amer: >60 mL/min (ref >60)
GFR calc non Af Amer: 57 mL/min — ABNORMAL LOW (ref >60)
Glucose, Bld: 156 mg/dL — ABNORMAL HIGH (ref 70–99)
Potassium: 5.6 mmol/L — ABNORMAL HIGH (ref 3.5–5.1)
Sodium: 141 mmol/L (ref 135–145)
Total Bilirubin: 1 mg/dL (ref 0.3–1.2)
Total Protein: 7.4 g/dL (ref 6.5–8.1)

## 2019-02-09 LAB — CBG MONITORING, ED: Glucose-Capillary: 143 mg/dL — ABNORMAL HIGH (ref 70–99)

## 2019-02-09 LAB — CBC WITH DIFFERENTIAL/PLATELET
Abs Immature Granulocytes: 0.02 10*3/uL (ref 0.00–0.07)
Basophils Absolute: 0 10*3/uL (ref 0.0–0.1)
Basophils Relative: 0 %
Eosinophils Absolute: 0 10*3/uL (ref 0.0–0.5)
Eosinophils Relative: 0 %
HCT: 45.7 % (ref 39.0–52.0)
Hemoglobin: 14.7 g/dL (ref 13.0–17.0)
Immature Granulocytes: 0 %
Lymphocytes Relative: 4 %
Lymphs Abs: 0.3 10*3/uL — ABNORMAL LOW (ref 0.7–4.0)
MCH: 31.6 pg (ref 26.0–34.0)
MCHC: 32.2 g/dL (ref 30.0–36.0)
MCV: 98.3 fL (ref 80.0–100.0)
Monocytes Absolute: 0 10*3/uL — ABNORMAL LOW (ref 0.1–1.0)
Monocytes Relative: 1 %
Neutro Abs: 7.4 10*3/uL (ref 1.7–7.7)
Neutrophils Relative %: 95 %
Platelets: 231 10*3/uL (ref 150–400)
RBC: 4.65 MIL/uL (ref 4.22–5.81)
RDW: 13.3 % (ref 11.5–15.5)
WBC: 7.7 10*3/uL (ref 4.0–10.5)
nRBC: 0 % (ref 0.0–0.2)

## 2019-02-09 MED ORDER — SODIUM CHLORIDE 0.9 % IV BOLUS
500.0000 mL | Freq: Once | INTRAVENOUS | Status: AC
  Administered 2019-02-09: 500 mL via INTRAVENOUS

## 2019-02-09 MED ORDER — LOSARTAN POTASSIUM 25 MG PO TABS: 25.0000 mg | ORAL_TABLET | Freq: Every day | ORAL | 0 refills | Status: DC

## 2019-02-09 MED ORDER — DABIGATRAN ETEXILATE MESYLATE 150 MG PO CAPS: 150.0000 mg | ORAL_CAPSULE | Freq: Two times a day (BID) | ORAL | 0 refills | Status: DC

## 2019-02-09 MED ORDER — METOPROLOL SUCCINATE ER 50 MG PO TB24: 50.0000 mg | ORAL_TABLET | Freq: Every day | ORAL | 0 refills | Status: DC

## 2019-02-09 MED ORDER — TIOTROPIUM BROMIDE MONOHYDRATE 18 MCG IN CAPS: 1.0000 | ORAL_CAPSULE | Freq: Every day | RESPIRATORY_TRACT | 0 refills | Status: DC

## 2019-02-09 MED ORDER — MOMETASONE FURO-FORMOTEROL FUM 200-5 MCG/ACT IN AERO: 2.0000 | INHALATION_SPRAY | Freq: Two times a day (BID) | RESPIRATORY_TRACT | 0 refills | Status: DC

## 2019-02-09 MED ORDER — ALBUTEROL SULFATE HFA 108 (90 BASE) MCG/ACT IN AERS: 1.0000 | INHALATION_SPRAY | Freq: Four times a day (QID) | RESPIRATORY_TRACT | 0 refills | Status: DC | PRN

## 2019-02-09 MED ORDER — DILTIAZEM HCL ER COATED BEADS 240 MG PO CP24: 240.0000 mg | ORAL_CAPSULE | Freq: Every day | ORAL | 0 refills | Status: DC

## 2019-02-09 MED ORDER — IOHEXOL 300 MG/ML  SOLN
100.0000 mL | Freq: Once | INTRAMUSCULAR | Status: AC | PRN
  Administered 2019-02-09: 100 mL via INTRAVENOUS

## 2019-02-09 NOTE — ED Notes (Signed)
Ambulated pt with pulse ox and his normal 3L, pt normally uses cane or walker to ambulate. Very difficult to ambulate, pt states his knees are very weak. Oxygen stayed around 94-98%.

## 2019-02-09 NOTE — ED Notes (Signed)
Pt states that his O2 needs to be turned up. O2 sat was at 100%, triage RN notified.

## 2019-02-09 NOTE — ED Provider Notes (Signed)
[facility]ENC[facility] Provider Note   CSN: [id number] Arrival date & time: 02/08/19  1825     History No chief complaint on file.   Johnny Rodriguez is a 68 y.o. male.  HPI Patient with fall.  Fell 2 days and when he tripped over his oxygen tubing.  Has had pain in his right abdomen and right chest since then.  Worse with movements.  Worse with palpation.  States he feels more short of breath.  He is on chronic oxygen.  History of COPD.  Has been off his medicines since getting out of jail around 5 months ago.  No swelling his legs.  States he is urinating frequently at night.  States he wears a diaper because of it.  Also has wound on his abdomen states it has been there for around a year.  No fevers.    Past Medical History:  Diagnosis Date   AF (atrial fibrillation) (HCC)    Alcohol abuse    Angina pectoris    CAD (coronary artery disease) 12/15/2004   COPD (chronic obstructive pulmonary disease) (HCC)    Coronary artery disease    Heavy cigarette smoker    HTN (hypertension)    Idiopathic cardiomyopathy (HCC) 07/16/2009   Non compliance with medical treatment    Seizures [facility](HCC)     Patient Active Problem List   Diagnosis Date Noted   Pneumonia 01/23/2017   Chronic respiratory failure with hypoxia (HCC) 10/15/2016   Cigarette smoker 09/09/2016   Long term current use of anticoagulant therapy 06/24/2016   History of syncope 06/24/2016   CAP (community acquired pneumonia) 02/15/2016   COPD exacerbation (HCC) 02/08/2015   Essential hypertension 02/08/2015   Syncope 12/10/2011   History of noncompliance with medical treatment 12/01/2011   Chronic systolic heart failure (HCC) 04/10/2011   Idiopathic cardiomyopathy (HCC) 07/16/2009   Paroxysmal atrial fibrillation (HCC) 07/16/2009   COPD PFT's pending  07/16/2009   CAD (coronary artery disease) 12/15/2004    Past Surgical History:  Procedure Laterality Date   CARDIAC  CATHETERIZATION  2006   CARDIAC CATHETERIZATION  12/2004   Left    CARDIOVERSION  01/21/2011   Procedure: CARDIOVERSION;  Surgeon: Darden PalmerW Spencer Tilley Jr., MD;  [facility];  Service: Cardiovascular;  Laterality: N/A;   DOPPLER ECHOCARDIOGRAPHY  02/2011   ELECTROPHYSIOLOGY STUDY N/A 12/16/2011   Procedure: ELECTROPHYSIOLOGY STUDY;  Surgeon: Marinus MawGregg W Taylor, MD;  [facility];  Service: Cardiovascular;  Laterality: N/A;   EP IMPLANTABLE DEVICE N/A 08/24/2015   Procedure: Loop Recorder Removal;  Surgeon: Marinus MawGregg W Taylor, MD;  Location: [facility] INVASIVE CV LAB;  Service: Cardiovascular;  Laterality: N/A;   fx collar bone     NO PAST SURGERIES         Family History  Problem Relation Age of Onset   Suicidality Sister    CVA Mother    Stroke Mother    Heart disease Father    Coronary artery disease Father    COPD Father     Social History   Tobacco Use   Smoking status: Current Every Day Smoker    Packs/day: 2.00    Years: 58.00    Pack years: 116.00   Smokeless tobacco: Never Used   Tobacco comment: pt states he is down to 1ppd  Substance Use Topics   Alcohol use: Yes    Alcohol/week: 0.0 standard drinks    Comment: EtOH use diorder - liquor   Drug use: No  Home Medications Prior to Admission medications   Medication Sig Start Date End Date Taking? Authorizing Provider  albuterol (PROAIR HFA) 108 (90 Base) MCG/ACT inhaler Inhale 1-2 puffs into the lungs every 6 (six) hours as needed for wheezing or shortness of breath. 02/09/19   Davonna Belling, MD  dabigatran (PRADAXA) 150 MG CAPS capsule Take 1 capsule (150 mg total) by mouth 2 (two) times daily. Medicaid [id number] Jenetta Downer 02/09/19   Davonna Belling, MD  diltiazem (CARDIZEM CD) 240 MG 24 hr capsule Take 1 capsule (240 mg total) by mouth daily. Medicaid [id number] O 02/09/19 03/11/19  Davonna Belling, MD  Ergocalciferol (VITAMIN D2) 2000 units TABS Take 1 tablet by mouth daily.    [provider]   folic acid (FOLVITE) 1 MG tablet Take 1 tablet (1 mg total) by mouth daily. 04/04/17   Hongalgi, Lenis Dickinson, MD  furosemide (LASIX) 40 MG tablet Take 1 tablet (40 mg total) by mouth daily. Medicaid [id number] O 04/03/17 05/03/17  Hongalgi, Lenis Dickinson, MD  losartan (COZAAR) 25 MG tablet Take 1 tablet (25 mg total) by mouth daily. Medicaid [id number] O 02/09/19 03/11/19  Davonna Belling, MD  metoprolol succinate (TOPROL-XL) 50 MG 24 hr tablet Take 1 tablet (50 mg total) by mouth daily. Take with or immediately following a meal. Medicaid [id number] O 02/09/19   Davonna Belling, MD  mometasone-formoterol [facility]) 200-5 MCG/ACT AERO Inhale 2 puffs into the lungs 2 (two) times daily. Medicaid [id number] Jenetta Downer 02/09/19   Davonna Belling, MD  Multiple Vitamin (MULTIVITAMIN WITH MINERALS) TABS tablet Take 1 tablet by mouth daily. 04/04/17   Hongalgi, Lenis Dickinson, MD  OXYGEN 3lpm 24/7  [facility]    [provider]  predniSONE (DELTASONE) 10 MG tablet Take 5 tabs daily for 3 days, then 4 tabs daily for 3 days, then 3 tabs daily for 3 days, then 2 tabs daily for 3 days, then 1 tab daily for 3 days, then stop. 04/03/17   Hongalgi, Lenis Dickinson, MD  thiamine 100 MG tablet Take 1 tablet (100 mg total) by mouth daily. 04/04/17   Hongalgi, Lenis Dickinson, MD  tiotropium (SPIRIVA) 18 MCG inhalation capsule Place 1 capsule (18 mcg total) into inhaler and inhale daily. 02/09/19   Davonna Belling, MD    Allergies    Patient has no known allergies.  Review of Systems   Review of Systems  Constitutional: Positive for fatigue. Negative for appetite change.  HENT: Negative for congestion.   Respiratory: Positive for shortness of breath.   Gastrointestinal: Positive for abdominal pain.  Musculoskeletal: Negative for back pain.  Skin: Positive for wound.  Neurological: Negative for weakness.  Psychiatric/Behavioral: Negative for confusion.    Physical Exam Updated Vital Signs BP (!) 135/98 (BP Location: Right Arm)    Pulse 84    Temp 98.2  F (36.8 C) (Oral)    Resp 18    Ht 5\' 11"  (1.803 m)    Wt 74.8 kg    SpO2 97%    BMI 23.01 kg/m   Physical Exam Vitals and nursing note reviewed.  HENT:     Head: Normocephalic.  Cardiovascular:     Rate and Rhythm: Regular rhythm.  Pulmonary:     Breath sounds: No wheezing or rhonchi.     Comments: Mildly harsh breath sounds.  Mild tenderness on anterior chest wall. Abdominal:     Tenderness: There is abdominal tenderness.     Comments: Moderate right flank and abdominal tenderness without rebound or guarding.  No ecchymosis.  Musculoskeletal:     Cervical  back: Neck supple.     Right lower leg: No edema.     Left lower leg: No edema.  Skin:    General: Skin is warm.     Capillary Refill: Capillary refill takes less than 2 seconds.  Neurological:     Mental Status: He is alert and oriented to person, place, and time.     ED Results / Procedures / Treatments   Labs (all labs ordered are listed, but only abnormal results are displayed) Labs Reviewed  COMPREHENSIVE METABOLIC PANEL - Abnormal; Notable for the following components:      Result Value   Potassium 5.6 (*)    Glucose, Bld 156 (*)    Creatinine, Ser 1.30 (*)    GFR calc non Af Amer 57 (*)    All other components within normal limits  CBC WITH DIFFERENTIAL/PLATELET - Abnormal; Notable for the following components:   Lymphs Abs 0.3 (*)    Monocytes Absolute 0.0 (*)    All other components within normal limits  CBG MONITORING, ED - Abnormal; Notable for the following components:   Glucose-Capillary 143 (*)    All other components within normal limits    EKG None  Radiology DG Ribs Unilateral W/Chest Right  Result Date: 02/08/2019 CLINICAL DATA:  Fall, pain. EXAM: RIGHT RIBS AND CHEST - 3+ VIEW COMPARISON:  Chest x-ray of 04/02/2017 FINDINGS: Cardiomediastinal contours are stable. Lungs are clear. No signs of pleural effusion or pneumothorax. Chronic deformity of left-sided ribs from prior fracture.  Unchanged. Possible nondisplaced right anterolateral rib fractures of fifth and sixth ribs. IMPRESSION: 1. Possible nondisplaced right anterolateral fifth and sixth rib fractures. 2. Stable chronic deformity of left-sided ribs from prior fracture. 3. No acute cardiopulmonary process.  No visible pneumothorax. Electronically Signed   By: Donzetta KohutGeoffrey  Wile M.D.   On: 02/08/2019 19:52   CT Chest W Contrast  Result Date: 02/09/2019 CLINICAL DATA:  68 year old male with fall and trauma to the right side. EXAM: CT CHEST, ABDOMEN, AND PELVIS WITH CONTRAST TECHNIQUE: Multidetector CT imaging of the chest, abdomen and pelvis was performed following the standard protocol during bolus administration of intravenous contrast. CONTRAST:  100mL OMNIPAQUE IOHEXOL 300 MG/ML  SOLN COMPARISON:  Chest radiograph dated 02/08/2019 and CT of the abdomen pelvis dated 12/08/2012. FINDINGS: CT CHEST FINDINGS Cardiovascular: There is no cardiomegaly or pericardial effusion. Multi vessel coronary vascular calcification with involvement of the LAD, RCA, and left circumflex artery. There is mild atherosclerotic calcification of the thoracic aorta. No aneurysmal dilatation or dissection. The origins of the great vessels of the aortic arch appear patent as visualized. The central pulmonary arteries are unremarkable for the degree of opacification. Mediastinum/Nodes: There is no hilar or mediastinal adenopathy. The esophagus and the thyroid gland are grossly unremarkable. No mediastinal fluid collection. Lungs/Pleura: Mild centrilobular emphysema. Right lung base nodular densities with tree-in-bud appearance likely sequela of prior infection. Recurrent pneumonia is not excluded. Clinical correlation is recommended. Mild right lower lobe bronchiectasis. No lobar consolidation, pleural effusion, or pneumothorax. The central airways are patent. Musculoskeletal: Old healed left posterior rib fractures. Mild compression fracture of the superior  endplate of T6 with less than 20% loss of vertebral body height, age indeterminate, possibly acute or subacute. Clinical correlation is recommended. No retropulsed fragment. No other acute fracture identified. CT ABDOMEN PELVIS FINDINGS No intra-abdominal free air or free fluid. Hepatobiliary: The liver is unremarkable. No intrahepatic biliary ductal dilatation. Subcentimeter left hepatic hypodense focus is too small to characterize. Small amount of  sludge within the gallbladder. No pericholecystic fluid. Pancreas: Unremarkable. No pancreatic ductal dilatation or surrounding inflammatory changes. Spleen: Normal in size without focal abnormality. Adrenals/Urinary Tract: The adrenal glands are unremarkable. The kidneys, visualized ureters, and urinary bladder appear unremarkable. There is symmetric enhancement and excretion of contrast by both kidneys. Stomach/Bowel: There is no bowel obstruction or active inflammation. The appendix is normal. Vascular/Lymphatic: Moderate aortoiliac atherosclerotic disease. The IVC is unremarkable. No portal venous gas. There is no adenopathy. Reproductive: The prostate and seminal vesicles are grossly unremarkable. No pelvic mass. Other: Small fat containing left inguinal hernia. Musculoskeletal: Mild lower lumbar facet arthropathy. No acute osseous pathology. 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 No acute fracture or cortical collapse. IMPRESSION: 1. Mild compression fracture of the superior endplate of T6 with less than 20% loss of vertebral body height, age indeterminate, possibly acute or subacute. Clinical correlation is recommended. No retropulsed fragment. 2. Clusters of nodular density in the right lower lobe likely sequela of prior infection. Recurrent pneumonia is not excluded. Clinical correlation is recommended. 3. No acute/traumatic intra-abdominopelvic pathology. 4. Aortic Atherosclerosis (ICD10-I70.0) and Emphysema (ICD10-J43.9). Electronically Signed   By:  Elgie Collard M.D.   On: 02/09/2019 10:57   CT ABDOMEN PELVIS W CONTRAST  Result Date: 02/09/2019 CLINICAL DATA:  68 year old male with fall and trauma to the right side. EXAM: CT CHEST, ABDOMEN, AND PELVIS WITH CONTRAST TECHNIQUE: Multidetector CT imaging of the chest, abdomen and pelvis was performed following the standard protocol during bolus administration of intravenous contrast. CONTRAST:  OMNIPAQUE IOHEXOL 300 MG/ML  SOLN COMPARISON:  Chest radiograph dated 02/08/2019 and CT of the abdomen pelvis dated 12/08/2012. FINDINGS: CT CHEST FINDINGS Cardiovascular: There is no cardiomegaly or pericardial effusion. Multi vessel coronary vascular calcification with involvement of the LAD, RCA, and left circumflex artery. There is mild atherosclerotic calcification of the thoracic aorta. No aneurysmal dilatation or dissection. The origins of the great vessels of the aortic arch appear patent as visualized. The central pulmonary arteries are unremarkable for the degree of opacification. Mediastinum/Nodes: There is no hilar or mediastinal adenopathy. The esophagus and the thyroid gland are grossly unremarkable. No mediastinal fluid collection. Lungs/Pleura: Mild centrilobular emphysema. Right lung base nodular densities with tree-in-bud appearance likely sequela of prior infection. Recurrent pneumonia is not excluded. Clinical correlation is recommended. Mild right lower lobe bronchiectasis. No lobar consolidation, pleural effusion, or pneumothorax. The central airways are patent. Musculoskeletal: Old healed left posterior rib fractures. Mild compression fracture of the superior endplate of T6 with less than 20% loss of vertebral body height, age indeterminate, possibly acute or subacute. Clinical correlation is recommended. No retropulsed fragment. No other acute fracture identified. CT ABDOMEN PELVIS FINDINGS No intra-abdominal free air or free fluid. Hepatobiliary: The liver is unremarkable. No  intrahepatic biliary ductal dilatation. Subcentimeter left hepatic hypodense focus is too small to characterize. Small amount of sludge within the gallbladder. No pericholecystic fluid. Pancreas: Unremarkable. No pancreatic ductal dilatation or surrounding inflammatory changes. Spleen: Normal in size without focal abnormality. Adrenals/Urinary Tract: The adrenal glands are unremarkable. The kidneys, visualized ureters, and urinary bladder appear unremarkable. There is symmetric enhancement and excretion of contrast by both kidneys. Stomach/Bowel: There is no bowel obstruction or active inflammation. The appendix is normal. Vascular/Lymphatic: Moderate aortoiliac atherosclerotic disease. The IVC is unremarkable. No portal venous gas. There is no adenopathy. Reproductive: The prostate and seminal vesicles are grossly unremarkable. No pelvic mass. Other: Small fat containing left inguinal hernia. Musculoskeletal: Mild lower lumbar facet arthropathy. No  acute osseous pathology. 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 No acute fracture or cortical collapse. IMPRESSION: 1. Mild compression fracture of the superior endplate of T6 with less than 20% loss of vertebral body height, age indeterminate, possibly acute or subacute. Clinical correlation is recommended. No retropulsed fragment. 2. Clusters of nodular density in the right lower lobe likely sequela of prior infection. Recurrent pneumonia is not excluded. Clinical correlation is recommended. 3. No acute/traumatic intra-abdominopelvic pathology. 4. Aortic Atherosclerosis (ICD10-I70.0) and Emphysema (ICD10-J43.9). Electronically Signed   By: Elgie Collard M.D.   On: 02/09/2019 10:57    Procedures Procedures (including critical care time)  Medications Ordered in ED Medications  sodium chloride 0.9 % bolus 500 mL (0 mLs Intravenous Stopped 02/09/19 1505)  iohexol (OMNIPAQUE) 300 MG/ML solution 100 mL (100 mLs Intravenous Contrast Given 02/09/19 1039)     ED Course  I have reviewed the triage vital signs and the nursing notes.  Pertinent labs & imaging results that were available during my care of the patient were reviewed by me and considered in my medical decision making (see chart for details).    MDM Rules/Calculators/A&P                      Patient presented after fall 2 days ago.  Tripped over his oxygen.  Chronic oxygen.  Also pain in his chest and abdomen.  Initial x-ray showed rib fractures.  However CT scan done due to chest pain and abdominal tenderness.  It was however reassuring.  Not hypoxic here on his oxygen.  Feels the patient be discharged home, however does not have clear follow-up.  Has been off his medicines.  Will have patient seen by transitions of care.  Chadsvasc of 3 for age hypertension and coronary artery disease.  Patient with fall.  CT scans reassuring.  However had been off his medicines.  Seen by social work about medications and follow-up.  Patient's later told her that he just needed prescriptions.  Prescriptions given.  Will follow up with health and wellness Final Clinical Impression(s) / ED Diagnoses Final diagnoses:  Contusion of abdominal wall, initial encounter  Fall, initial encounter  Atrial fibrillation, unspecified type [facility])    Rx / DC Orders ED Discharge Orders         Ordered    albuterol (PROAIR HFA) 108 (90 Base) MCG/ACT inhaler  Every 6 hours PRN     02/09/19 1440    dabigatran (PRADAXA) 150 MG CAPS capsule  2 times daily     02/09/19 1440    diltiazem (CARDIZEM CD) 240 MG 24 hr capsule  Daily     02/09/19 1440    losartan (COZAAR) 25 MG tablet  Daily     02/09/19 1440    mometasone-formoterol (DULERA) 200-5 MCG/ACT AERO  2 times daily     02/09/19 1440    tiotropium (SPIRIVA) 18 MCG inhalation capsule  Daily     02/09/19 1440    metoprolol succinate (TOPROL-XL) 50 MG 24 hr tablet  Daily     02/09/19 1440           Benjiman Core, MD 02/09/19 1534

## 2019-02-09 NOTE — Telephone Encounter (Signed)
Tried reaching out to patient to schedule appointment. Received referral from ED

## 2019-02-09 NOTE — Discharge Planning (Signed)
[facility] met with pt at bedside to find that pt was in need of refill Rx.  Pt is insured and has funds to purchase.

## 2019-03-10 ENCOUNTER — Encounter (HOSPITAL_COMMUNITY): Payer: Self-pay

## 2019-03-10 ENCOUNTER — Other Ambulatory Visit: Payer: Self-pay

## 2019-03-10 ENCOUNTER — Inpatient Hospital Stay (HOSPITAL_COMMUNITY)
Admission: EM | Admit: 2019-03-10 | Discharge: 2019-03-15 | DRG: 190 | Disposition: A | Discharge status: Home Health | Payer: Medicare Other | Attending: Student in an Organized Health Care Education/Training Program | Admitting: Student in an Organized Health Care Education/Training Program

## 2019-03-10 ENCOUNTER — Emergency Department (HOSPITAL_COMMUNITY): Payer: Medicare Other

## 2019-03-10 DIAGNOSIS — Z7901 Long term (current) use of anticoagulants: Secondary | ICD-10-CM | POA: Diagnosis not present

## 2019-03-10 DIAGNOSIS — Z9981 Dependence on supplemental oxygen: Secondary | ICD-10-CM

## 2019-03-10 DIAGNOSIS — J441 Chronic obstructive pulmonary disease with (acute) exacerbation: Secondary | ICD-10-CM | POA: Diagnosis present

## 2019-03-10 DIAGNOSIS — Z8249 Family history of ischemic heart disease and other diseases of the circulatory system: Secondary | ICD-10-CM | POA: Diagnosis not present

## 2019-03-10 DIAGNOSIS — I429 Cardiomyopathy, unspecified: Secondary | ICD-10-CM | POA: Diagnosis present

## 2019-03-10 DIAGNOSIS — Z79899 Other long term (current) drug therapy: Secondary | ICD-10-CM | POA: Diagnosis not present

## 2019-03-10 DIAGNOSIS — Z20822 Contact with and (suspected) exposure to covid-19: Secondary | ICD-10-CM | POA: Diagnosis not present

## 2019-03-10 DIAGNOSIS — F17210 Nicotine dependence, cigarettes, uncomplicated: Secondary | ICD-10-CM | POA: Diagnosis not present

## 2019-03-10 DIAGNOSIS — R1010 Upper abdominal pain, unspecified: Secondary | ICD-10-CM

## 2019-03-10 DIAGNOSIS — K219 Gastro-esophageal reflux disease without esophagitis: Secondary | ICD-10-CM | POA: Diagnosis not present

## 2019-03-10 DIAGNOSIS — I2510 Atherosclerotic heart disease of native coronary artery without angina pectoris: Secondary | ICD-10-CM | POA: Diagnosis present

## 2019-03-10 DIAGNOSIS — Z66 Do not resuscitate: Secondary | ICD-10-CM | POA: Diagnosis present

## 2019-03-10 DIAGNOSIS — K5669 Other partial intestinal obstruction: Secondary | ICD-10-CM | POA: Diagnosis present

## 2019-03-10 DIAGNOSIS — Z4659 Encounter for fitting and adjustment of other gastrointestinal appliance and device: Secondary | ICD-10-CM

## 2019-03-10 DIAGNOSIS — N179 Acute kidney failure, unspecified: Secondary | ICD-10-CM | POA: Diagnosis not present

## 2019-03-10 DIAGNOSIS — I110 Hypertensive heart disease with heart failure: Secondary | ICD-10-CM | POA: Diagnosis not present

## 2019-03-10 DIAGNOSIS — J9621 Acute and chronic respiratory failure with hypoxia: Secondary | ICD-10-CM | POA: Diagnosis not present

## 2019-03-10 DIAGNOSIS — R109 Unspecified abdominal pain: Secondary | ICD-10-CM

## 2019-03-10 DIAGNOSIS — N289 Disorder of kidney and ureter, unspecified: Secondary | ICD-10-CM

## 2019-03-10 DIAGNOSIS — K5909 Other constipation: Secondary | ICD-10-CM | POA: Diagnosis not present

## 2019-03-10 DIAGNOSIS — E875 Hyperkalemia: Secondary | ICD-10-CM | POA: Diagnosis not present

## 2019-03-10 DIAGNOSIS — Z823 Family history of stroke: Secondary | ICD-10-CM | POA: Diagnosis not present

## 2019-03-10 DIAGNOSIS — I480 Paroxysmal atrial fibrillation: Secondary | ICD-10-CM | POA: Diagnosis present

## 2019-03-10 DIAGNOSIS — Z825 Family history of asthma and other chronic lower respiratory diseases: Secondary | ICD-10-CM

## 2019-03-10 DIAGNOSIS — I5032 Chronic diastolic (congestive) heart failure: Secondary | ICD-10-CM | POA: Diagnosis not present

## 2019-03-10 DIAGNOSIS — K56609 Unspecified intestinal obstruction, unspecified as to partial versus complete obstruction: Secondary | ICD-10-CM | POA: Diagnosis present

## 2019-03-10 LAB — SARS CORONAVIRUS 2 (TAT 6-24 HRS): SARS Coronavirus 2: NEGATIVE

## 2019-03-10 LAB — BASIC METABOLIC PANEL
Anion gap: 13 (ref 5–15)
BUN: 27 mg/dL — ABNORMAL HIGH (ref 8–23)
CO2: 27 mmol/L (ref 22–32)
Calcium: 9.3 mg/dL (ref 8.9–10.3)
Chloride: 99 mmol/L (ref 98–111)
Creatinine, Ser: 1.47 mg/dL — ABNORMAL HIGH (ref 0.61–1.24)
GFR calc Af Amer: 57 mL/min — ABNORMAL LOW (ref >60)
GFR calc non Af Amer: 49 mL/min — ABNORMAL LOW (ref >60)
Glucose, Bld: 116 mg/dL — ABNORMAL HIGH (ref 70–99)
Potassium: 3.7 mmol/L (ref 3.5–5.1)
Sodium: 139 mmol/L (ref 135–145)

## 2019-03-10 LAB — CBC WITH DIFFERENTIAL/PLATELET
Abs Immature Granulocytes: 0.02 10*3/uL (ref 0.00–0.07)
Basophils Absolute: 0 10*3/uL (ref 0.0–0.1)
Basophils Relative: 1 %
Eosinophils Absolute: 0.1 10*3/uL (ref 0.0–0.5)
Eosinophils Relative: 1 %
HCT: 43.2 % (ref 39.0–52.0)
Hemoglobin: 14 g/dL (ref 13.0–17.0)
Immature Granulocytes: 0 %
Lymphocytes Relative: 13 %
Lymphs Abs: 0.9 10*3/uL (ref 0.7–4.0)
MCH: 30.2 pg (ref 26.0–34.0)
MCHC: 32.4 g/dL (ref 30.0–36.0)
MCV: 93.3 fL (ref 80.0–100.0)
Monocytes Absolute: 0.6 10*3/uL (ref 0.1–1.0)
Monocytes Relative: 9 %
Neutro Abs: 5.1 10*3/uL (ref 1.7–7.7)
Neutrophils Relative %: 76 %
Platelets: 215 10*3/uL (ref 150–400)
RBC: 4.63 MIL/uL (ref 4.22–5.81)
RDW: 14.3 % (ref 11.5–15.5)
WBC: 6.7 10*3/uL (ref 4.0–10.5)
nRBC: 0 % (ref 0.0–0.2)

## 2019-03-10 LAB — LIPASE, BLOOD: Lipase: 22 U/L (ref 11–51)

## 2019-03-10 LAB — HIV ANTIBODY (ROUTINE TESTING W REFLEX): HIV Screen 4th Generation wRfx: NONREACTIVE

## 2019-03-10 LAB — BRAIN NATRIURETIC PEPTIDE: B Natriuretic Peptide: 41.1 pg/mL (ref 0.0–100.0)

## 2019-03-10 MED ORDER — AZITHROMYCIN 250 MG PO TABS
250.0000 mg | ORAL_TABLET | Freq: Every day | ORAL | Status: DC
  Administered 2019-03-11: 250 mg via ORAL
  Filled 2019-03-10: qty 1

## 2019-03-10 MED ORDER — DILTIAZEM HCL ER COATED BEADS 120 MG PO CP24
240.0000 mg | ORAL_CAPSULE | Freq: Every day | ORAL | Status: DC
  Administered 2019-03-11 – 2019-03-15 (×5): 240 mg via ORAL
  Filled 2019-03-10 (×5): qty 2

## 2019-03-10 MED ORDER — METHYLPREDNISOLONE SODIUM SUCC 125 MG IJ SOLR
125.0000 mg | Freq: Once | INTRAMUSCULAR | Status: AC
  Administered 2019-03-10: 125 mg via INTRAVENOUS
  Filled 2019-03-10: qty 2

## 2019-03-10 MED ORDER — AZITHROMYCIN 250 MG PO TABS
500.0000 mg | ORAL_TABLET | Freq: Every day | ORAL | Status: AC
  Administered 2019-03-10: 500 mg via ORAL
  Filled 2019-03-10: qty 2

## 2019-03-10 MED ORDER — DABIGATRAN ETEXILATE MESYLATE 150 MG PO CAPS
150.0000 mg | ORAL_CAPSULE | Freq: Two times a day (BID) | ORAL | Status: DC
  Administered 2019-03-10 – 2019-03-11 (×3): 150 mg via ORAL
  Filled 2019-03-10 (×4): qty 1

## 2019-03-10 MED ORDER — ACETAMINOPHEN 650 MG RE SUPP: 650.0000 mg | Freq: Four times a day (QID) | RECTAL | Status: DC | PRN

## 2019-03-10 MED ORDER — IPRATROPIUM-ALBUTEROL 0.5-2.5 (3) MG/3ML IN SOLN
3.0000 mL | Freq: Once | RESPIRATORY_TRACT | Status: AC
  Administered 2019-03-10: 3 mL via RESPIRATORY_TRACT
  Filled 2019-03-10: qty 3

## 2019-03-10 MED ORDER — IPRATROPIUM-ALBUTEROL 0.5-2.5 (3) MG/3ML IN SOLN
3.0000 mL | Freq: Once | RESPIRATORY_TRACT | Status: AC
  Administered 2019-03-10: 07:00:00 3 mL via RESPIRATORY_TRACT
  Filled 2019-03-10: qty 3

## 2019-03-10 MED ORDER — ALUM & MAG HYDROXIDE-SIMETH 200-200-20 MG/5ML PO SUSP
30.0000 mL | Freq: Once | ORAL | Status: AC
  Administered 2019-03-10: 30 mL via ORAL
  Filled 2019-03-10: qty 30

## 2019-03-10 MED ORDER — ADULT MULTIVITAMIN W/MINERALS CH
1.0000 | ORAL_TABLET | Freq: Every day | ORAL | Status: DC
  Administered 2019-03-10 – 2019-03-11 (×2): 1 via ORAL
  Filled 2019-03-10 (×2): qty 1

## 2019-03-10 MED ORDER — THIAMINE HCL 100 MG PO TABS
100.0000 mg | ORAL_TABLET | Freq: Every day | ORAL | Status: DC
  Administered 2019-03-10 – 2019-03-11 (×2): 100 mg via ORAL
  Filled 2019-03-10 (×2): qty 1

## 2019-03-10 MED ORDER — SODIUM CHLORIDE 0.9% FLUSH
3.0000 mL | Freq: Two times a day (BID) | INTRAVENOUS | Status: DC
  Administered 2019-03-10 – 2019-03-15 (×9): 3 mL via INTRAVENOUS

## 2019-03-10 MED ORDER — PREDNISONE 20 MG PO TABS
40.0000 mg | ORAL_TABLET | Freq: Every day | ORAL | Status: DC
  Administered 2019-03-11: 40 mg via ORAL
  Filled 2019-03-10: qty 2

## 2019-03-10 MED ORDER — ACETAMINOPHEN 325 MG PO TABS: 650.0000 mg | ORAL_TABLET | Freq: Four times a day (QID) | ORAL | Status: DC | PRN

## 2019-03-10 MED ORDER — RAMELTEON 8 MG PO TABS
8.0000 mg | ORAL_TABLET | Freq: Every day | ORAL | Status: DC
  Administered 2019-03-10 – 2019-03-14 (×5): 8 mg via ORAL
  Filled 2019-03-10 (×6): qty 1

## 2019-03-10 MED ORDER — LOSARTAN POTASSIUM 25 MG PO TABS
25.0000 mg | ORAL_TABLET | Freq: Every day | ORAL | Status: DC
  Administered 2019-03-11: 25 mg via ORAL
  Filled 2019-03-10: qty 1

## 2019-03-10 MED ORDER — FOLIC ACID 1 MG PO TABS
1.0000 mg | ORAL_TABLET | Freq: Every day | ORAL | Status: DC
  Administered 2019-03-10 – 2019-03-11 (×2): 1 mg via ORAL
  Filled 2019-03-10 (×2): qty 1

## 2019-03-10 MED ORDER — PANTOPRAZOLE SODIUM 40 MG PO TBEC
40.0000 mg | DELAYED_RELEASE_TABLET | Freq: Every day | ORAL | Status: DC
  Administered 2019-03-10 – 2019-03-11 (×2): 40 mg via ORAL
  Filled 2019-03-10 (×2): qty 1

## 2019-03-10 MED ORDER — METOPROLOL SUCCINATE ER 50 MG PO TB24
50.0000 mg | ORAL_TABLET | Freq: Every day | ORAL | Status: DC
  Administered 2019-03-10 – 2019-03-15 (×6): 50 mg via ORAL
  Filled 2019-03-10 (×5): qty 1
  Filled 2019-03-10: qty 2

## 2019-03-10 MED ORDER — IPRATROPIUM-ALBUTEROL 0.5-2.5 (3) MG/3ML IN SOLN
3.0000 mL | Freq: Four times a day (QID) | RESPIRATORY_TRACT | Status: DC
  Administered 2019-03-10 (×2): 3 mL via RESPIRATORY_TRACT
  Filled 2019-03-10 (×2): qty 3

## 2019-03-10 MED ORDER — MAGNESIUM SULFATE 2 GM/50ML IV SOLN
2.0000 g | INTRAVENOUS | Status: AC
  Administered 2019-03-10: 2 g via INTRAVENOUS
  Filled 2019-03-10: qty 50

## 2019-03-10 MED ORDER — MOMETASONE FURO-FORMOTEROL FUM 200-5 MCG/ACT IN AERO
2.0000 | INHALATION_SPRAY | Freq: Two times a day (BID) | RESPIRATORY_TRACT | Status: DC
  Administered 2019-03-10 – 2019-03-15 (×10): 2 via RESPIRATORY_TRACT
  Filled 2019-03-10 (×2): qty 8.8

## 2019-03-10 MED ORDER — LIDOCAINE VISCOUS HCL 2 % MT SOLN
15.0000 mL | Freq: Once | OROMUCOSAL | Status: AC
  Administered 2019-03-10: 15 mL via ORAL
  Filled 2019-03-10: qty 15

## 2019-03-10 NOTE — Progress Notes (Signed)
Pt stated to RN that his abdominal pain is not getting any better. RN paged IM waiting for call back

## 2019-03-10 NOTE — ED Provider Notes (Signed)
[facility] EMERGENCY DEPARTMENT Provider Note   CSN: [id number] Arrival date & time: 03/10/19  0353   History Chief Complaint  Patient presents with  . Shortness of Breath    Johnny Rodriguez is a 69 y.o. male.  The history is provided by the patient.  Shortness of Breath He has history of hypertension, COPD, atrial fibrillation anticoagulated on dabigatran and comes in because of worsening shortness of breath.  Apparently, her breathing got worse tonight, although he is a somewhat vague historian.  He has had a chronic cough which is productive of white sputum and is unchanged from baseline.  He denies fever chills or sweats.  He has had some soreness in his left rib cage where he had fallen recently.  He has used his home inhaler without any improvement.  However, his dyspnea is rather severe at baseline to the point where he is unable to take out his trash without assistance.  He is on constant nasal oxygen.  He still smokes half of a cigarette a day.  He denies exposure to COVID-19.  Past Medical History:  Diagnosis Date  . AF (atrial fibrillation) (HCC)   . Alcohol abuse   . Angina pectoris   . CAD (coronary artery disease) 12/15/2004  . COPD (chronic obstructive pulmonary disease) (HCC)   . Coronary artery disease   . Heavy cigarette smoker   . HTN (hypertension)   . Idiopathic cardiomyopathy (HCC) 07/16/2009  . Non compliance with medical treatment   . Seizures [facility])     Patient Active Problem List   Diagnosis Date Noted  . Pneumonia 01/23/2017  . Chronic respiratory failure with hypoxia (HCC) 10/15/2016  . Cigarette smoker 09/09/2016  . Long term current use of anticoagulant therapy 06/24/2016  . History of syncope 06/24/2016  . CAP (community acquired pneumonia) 02/15/2016  . COPD exacerbation (HCC) 02/08/2015  . Essential hypertension 02/08/2015  . Syncope 12/10/2011  . History of noncompliance with medical treatment 12/01/2011  . Chronic systolic  heart failure (HCC) 81/85/6314  . Idiopathic cardiomyopathy (HCC) 07/16/2009  . Paroxysmal atrial fibrillation (HCC) 07/16/2009  . COPD PFT's pending  07/16/2009  . CAD (coronary artery disease) 12/15/2004    Past Surgical History:  Procedure Laterality Date  . CARDIAC CATHETERIZATION  2006  . CARDIAC CATHETERIZATION  12/2004   Left   . CARDIOVERSION  01/21/2011   Procedure: CARDIOVERSION;  Surgeon: Darden Palmer., MD;  Location: [facility] OR;  Service: Cardiovascular;  Laterality: N/A;  . DOPPLER ECHOCARDIOGRAPHY  02/2011  . ELECTROPHYSIOLOGY STUDY N/A 12/16/2011   Procedure: ELECTROPHYSIOLOGY STUDY;  Surgeon: Marinus Maw, MD;  Location: [facility] CATH LAB;  Service: Cardiovascular;  Laterality: N/A;  . EP IMPLANTABLE DEVICE N/A 08/24/2015   Procedure: Loop Recorder Removal;  Surgeon: Marinus Maw, MD;  Location: [facility] INVASIVE CV LAB;  Service: Cardiovascular;  Laterality: N/A;  . fx collar bone    . NO PAST SURGERIES         Family History  Problem Relation Age of Onset  . Suicidality Sister   . CVA Mother   . Stroke Mother   . Heart disease Father   . Coronary artery disease Father   . COPD Father     Social History   Tobacco Use  . Smoking status: Current Every Day Smoker    Packs/day: 2.00    Years: 58.00    Pack years: 116.00  . Smokeless tobacco: Never Used  . Tobacco comment: pt states he  is down to 1ppd  Substance Use Topics  . Alcohol use: Yes    Alcohol/week: 0.0 standard drinks    Comment: EtOH use diorder - liquor  . Drug use: No    Home Medications Prior to Admission medications   Medication Sig Start Date End Date Taking? Authorizing Provider  albuterol (PROAIR HFA) 108 (90 Base) MCG/ACT inhaler Inhale 1-2 puffs into the lungs every 6 (six) hours as needed for wheezing or shortness of breath. 02/09/19   Benjiman Core, MD  dabigatran (PRADAXA) 150 MG CAPS capsule Take 1 capsule (150 mg total) by mouth 2 (two) times daily. Medicaid [id number] Val Eagle 02/09/19    Benjiman Core, MD  diltiazem (CARDIZEM CD) 240 MG 24 hr capsule Take 1 capsule (240 mg total) by mouth daily. Medicaid [id number] O 02/09/19 03/11/19  Benjiman Core, MD  Ergocalciferol (VITAMIN D2) 2000 units TABS Take 1 tablet by mouth daily.    [provider]  folic acid (FOLVITE) 1 MG tablet Take 1 tablet (1 mg total) by mouth daily. 04/04/17   Hongalgi, Maximino Greenland, MD  furosemide (LASIX) 40 MG tablet Take 1 tablet (40 mg total) by mouth daily. Medicaid [id number] O 04/03/17 05/03/17  Hongalgi, Maximino Greenland, MD  losartan (COZAAR) 25 MG tablet Take 1 tablet (25 mg total) by mouth daily. Medicaid [id number] O 02/09/19 03/11/19  Benjiman Core, MD  metoprolol succinate (TOPROL-XL) 50 MG 24 hr tablet Take 1 tablet (50 mg total) by mouth daily. Take with or immediately following a meal. Medicaid [id number] O 02/09/19   Benjiman Core, MD  mometasone-formoterol [facility]) 200-5 MCG/ACT AERO Inhale 2 puffs into the lungs 2 (two) times daily. Medicaid [id number] Val Eagle 02/09/19   Benjiman Core, MD  Multiple Vitamin (MULTIVITAMIN WITH MINERALS) TABS tablet Take 1 tablet by mouth daily. 04/04/17   Hongalgi, Maximino Greenland, MD  OXYGEN 3lpm 24/7  [facility]    [provider]  predniSONE (DELTASONE) 10 MG tablet Take 5 tabs daily for 3 days, then 4 tabs daily for 3 days, then 3 tabs daily for 3 days, then 2 tabs daily for 3 days, then 1 tab daily for 3 days, then stop. 04/03/17   Hongalgi, Maximino Greenland, MD  thiamine 100 MG tablet Take 1 tablet (100 mg total) by mouth daily. 04/04/17   Hongalgi, Maximino Greenland, MD  tiotropium (SPIRIVA) 18 MCG inhalation capsule Place 1 capsule (18 mcg total) into inhaler and inhale daily. 02/09/19   Benjiman Core, MD    Allergies    Patient has no known allergies.  Review of Systems   Review of Systems  Respiratory: Positive for shortness of breath.   All other systems reviewed and are negative.   Physical Exam Updated Vital Signs BP 124/82   Pulse 79   Temp 97.7 F (36.5 C)  (Oral)   Resp 20   Ht 5\' 11"  (1.803 m)   Wt 68 kg   SpO2 95%   BMI 20.92 kg/m   Physical Exam Vitals and nursing note reviewed.   69 year old male, resting comfortably and in no acute distress. Vital signs are normal. Oxygen saturation is 95%, which is normal. Head is normocephalic and atraumatic. PERRLA, EOMI. Oropharynx is clear. Neck is nontender and supple without adenopathy or JVD. Back is nontender and there is no CVA tenderness. Lungs have distant sounds with faint expiratory wheezes bilaterally.  There are no rales or rhonchi. Chest is mildly tender in the left lateral rib cage.  There is no crepitus. Heart has regular rate and rhythm without murmur. Abdomen  is soft, flat, nontender without masses or hepatosplenomegaly and peristalsis is normoactive. Extremities have 1+ pedal edema, full range of motion is present. Skin is warm and dry without rash. Neurologic: Mental status is normal, cranial nerves are intact, there are no motor or sensory deficits.  ED Results / Procedures / Treatments   Labs (all labs ordered are listed, but only abnormal results are displayed) Labs Reviewed  BASIC METABOLIC PANEL - Abnormal; Notable for the following components:      Result Value   Glucose, Bld 116 (*)    BUN 27 (*)    Creatinine, Ser 1.47 (*)    GFR calc non Af Amer 49 (*)    GFR calc Af Amer 57 (*)    All other components within normal limits  SARS CORONAVIRUS 2 (TAT 6-24 HRS)  CBC WITH DIFFERENTIAL/PLATELET  BRAIN NATRIURETIC PEPTIDE    EKG EKG Interpretation  Date/Time:  Thursday March 10 2019 04:01:38 EST Ventricular Rate:  76 PR Interval:    QRS Duration: 101 QT Interval:  444 QTC Calculation: 506 R Axis:   -85 Text Interpretation: Atrial fibrillation Inferior infarct, old Anterior infarct, old Lateral leads are also involved Prolonged QT interval When compared with ECG of 02/09/2019, QT has lengthened Confirmed by Dione Booze ([id number]) on 03/10/2019 4:03:15  AM   Radiology DG Chest Port 1 View  Result Date: 03/10/2019 CLINICAL DATA:  Shortness of breath EXAM: PORTABLE CHEST 1 VIEW COMPARISON:  Radiograph 02/08/2019, CT 02/09/2019 FINDINGS: Reticulonodular opacity seen in the right lung base on comparison CT are not well visualized on this exam. No consolidation, features of edema, pneumothorax, or effusion. The aorta is calcified. The remaining cardiomediastinal contours are unremarkable. Redemonstration of the posterior 6 seventh and eighth left rib fractures. No acute osseous or soft tissue abnormality. Degenerative changes are present in the imaged spine and shoulders. Telemetry leads overlie the chest. IMPRESSION: No acute cardiopulmonary abnormality. Electronically Signed   By: Kreg Shropshire M.D.   On: 03/10/2019 04:34    Procedures Procedures   Medications Ordered in ED Medications  ipratropium-albuterol (DUONEB) 0.5-2.5 (3) MG/3ML nebulizer solution 3 mL (has no administration in time range)  ipratropium-albuterol (DUONEB) 0.5-2.5 (3) MG/3ML nebulizer solution 3 mL (3 mLs Nebulization Given 03/10/19 0443)  methylPREDNISolone sodium succinate (SOLU-MEDROL) 125 mg/2 mL injection 125 mg (125 mg Intravenous Given 03/10/19 0437)  ipratropium-albuterol (DUONEB) 0.5-2.5 (3) MG/3ML nebulizer solution 3 mL (3 mLs Nebulization Given 03/10/19 4174)    ED Course  I have reviewed the triage vital signs and the nursing notes.  Pertinent labs & imaging results that were available during my care of the patient were reviewed by me and considered in my medical decision making (see chart for details).  MDM Rules/Calculators/A&P COPD exacerbation.  Will check chest x-ray to rule out pneumonia.  Old records reviewed, and he does have several prior hospitalizations for COPD exacerbation.  He will be given albuterol with ipratropium via nebulizer and also methylprednisolone.  ECG shows atrial fibrillation with borderline prolonged QT interval.  He has received 2  nebulizer treatments with albuterol and ipratropium with little change.  He continues to have faint wheezing with poor airflow and tight feeling in his chest.  Labs do show slight increase in creatinine over baseline but are otherwise unremarkable.  Chest x-ray shows COPD without acute change.  He will be given a third nebulizer treatment.  Case is signed out to Dr. Hyacinth Meeker.  Final Clinical Impression(s) / ED Diagnoses Final diagnoses:  COPD exacerbation (  [facility])  Renal insufficiency    Rx / DC Orders ED Discharge Orders    None       Delora Fuel, MD 81/10/31 [phone]

## 2019-03-10 NOTE — ED Provider Notes (Addendum)
This patient is a 69 year old male presenting with increasing shortness of breath, he has a known history of COPD as well as congestive heart failure, he has a chronic cough but has had increased amounts of sputum recently. He is on 3 L by nasal cannula home oxygen, denies any fevers or chills or swelling of the legs. After several nebulized treatments of albuterol with ipratropium, the patient has had no significant improvement and continues to be tachypneic with a prolonged expiratory phase. X-ray reveals no signs of pneumonia, he was given Solu-Medrol, he has been given magnesium, he will need to be admitted for ongoing respiratory distress. The patient is speaking in shortened sentences. He denies any recent fevers or sick contacts. Covid test pending.  Continues to smoke 1/2 PPD cigarettes.  Cutting back.  PCP - unassigned - [facility]  D/w Internal Medicine resident who will admit - 8:44 AM   Johnny Rodriguez was evaluated in Emergency Department on 03/10/2019 for the symptoms described in the history of present illness. He was evaluated in the context of the global COVID-19 pandemic, which necessitated consideration that the patient might be at risk for infection with the SARS-CoV-2 virus that causes COVID-19. Institutional protocols and algorithms that pertain to the evaluation of patients at risk for COVID-19 are in a state of rapid change based on information released by regulatory bodies including the CDC and federal and state organizations. These policies and algorithms were followed during the patient's care in the ED.  8:44 AM Cardiac monitoring reveals NSR at 80-90 bpm (Rate & rhythm), as reviewed and interpreted by me. Cardiac monitoring was ordered due to shortness of breath and to monitor patient for dysrhythmia.     Eber Hong, MD 03/10/19 0932    Eber Hong, MD 03/10/19 [phone]

## 2019-03-10 NOTE — ED Notes (Signed)
Lunch Tray Ordered @ 1026. 

## 2019-03-10 NOTE — TOC Initial Note (Addendum)
Transition of Care [facility]) - Initial/Assessment Note    Patient Details  Name: Johnny Rodriguez MRN: [id number] Date of Birth: [birth year]  [facility]) CM/SW Contact:    Kingsley Plan, RN Phone Number: 03/10/2019, 3:23 PM  Clinical Narrative:                 Spoke with patient at bedside. Confirmed face sheet information.  Patient staying in a hotel and can return there at discharge. Patient has home CPAP and Home oxygen ( portable tank and concentrator ). Patient states the jail [facility]) gave him the oxygen and CPAP when he was released. Prior to that he had [employer] [facility].   Called Zack with [employer] , Timothy Lasso will check to see if oxygen came from them.   Patient will need transportation home at discharge.  Patient has cane.  Will continue to follow.  Patient is active with [employer] for CPAP and oxygen   Expected Discharge Plan: Home/Self Care     Patient Goals and CMS Choice Patient states their goals for this hospitalization and ongoing recovery are:: to go home CMS Medicare.gov Compare Post Acute Care list provided to:: Patient Choice offered to / list presented to : Patient  Expected Discharge Plan and Services Expected Discharge Plan: Home/Self Care       Living arrangements for the past 2 months: Hotel/Motel                                      Prior Living Arrangements/Services Living arrangements for the past 2 months: Hotel/Motel Lives with:: Self              Current home services: DME    Activities of Daily Living      Permission Sought/Granted   Permission granted to share information with : No              Emotional Assessment   Attitude/Demeanor/Rapport: Self-Confident Affect (typically observed): Accepting Orientation: : Oriented to Self, Oriented to Place, Oriented to Situation, Oriented to  Time Alcohol / Substance Use: Not Applicable Psych Involvement: No (comment)  Admission diagnosis:   Renal insufficiency [N28.9] COPD exacerbation (HCC) [J44.1] Patient Active Problem List   Diagnosis Date Noted  . AKI (acute kidney injury) (HCC) 03/10/2019  . Epigastric pain 03/10/2019  . Chronic respiratory failure with hypoxia (HCC) 10/15/2016  . Cigarette smoker 09/09/2016  . Long term current use of anticoagulant therapy 06/24/2016  . History of syncope 06/24/2016  . COPD exacerbation (HCC) 02/08/2015  . Essential hypertension 02/08/2015  . Syncope 12/10/2011  . History of noncompliance with medical treatment 12/01/2011  . Chronic systolic heart failure (HCC) 04/10/2011  . Idiopathic cardiomyopathy (HCC) 07/16/2009  . Paroxysmal atrial fibrillation (HCC) 07/16/2009  . COPD PFT's pending  07/16/2009  . CAD (coronary artery disease) 12/15/2004   PCP:  Gwenyth Bender, MD Pharmacy:   [facility] [phone] - [place], [place] - [phone] [facility] [address] [facility] [address] [place] [address] Phone: [phone] Fax: [phone]     Social Determinants of Health (SDOH) Interventions    Readmission Risk Interventions No flowsheet data found.

## 2019-03-10 NOTE — H&P (Signed)
Date: 03/10/2019               Patient Name:  Johnny Rodriguez MRN: [id number]  DOB: [birth year] Age / Sex: 69 y.o., male   PCP: Gwenyth Bender, MD         Medical Service: Internal Medicine Teaching Service         Attending Physician: Dr. Oswaldo Done, Marquita Palms, *    First Contact: Dr. Huel Cote Pager: [phone]  Second Contact: Dr. Chesley Mires Pager: [phone]       After Hours (After 5p/  First Contact Pager: [phone]  weekends / holidays): Second Contact Pager: [phone]   Chief Complaint: Shortness of breath   History of Present Illness: Johnny Rodriguez is a 69 year-old male with a history of COPD on 3L of supplemental O2 at home, CAD, PAF on Pradaxa, and HTN who presents to the ED for evaluation of shortness of breath.  Patient was in his usual state of health until 1 week ago when he began to experience increased dyspnea while performing ADLs at home.  He also reports associated increase in sputum production during this time.  He has been using his home Spiriva and Dulera as prescribed as well as albuterol as needed but symptoms have not improved.  He is on 3 L of supplemental O2 at home and has not had to increased it.  Denies fevers, chills, sick contacts, potential exposures to COVID-19.   He also reports epigastric pain that is intermittent in nature, radiates to his chest, and is associated with anorexia.  Denies nausea, vomiting, or changes in bowel movements (usually constipated).  Last BM was 3 days ago.  Reports drinking 1 beer yesterday which helped with the pain.  Meds: Medications confirmed with patient on admission Current Meds  Medication Sig  . albuterol (PROAIR HFA) 108 (90 Base) MCG/ACT inhaler Inhale 1-2 puffs into the lungs every 6 (six) hours as needed for wheezing or shortness of breath.  Marland Kitchen amiodarone (PACERONE) 200 MG tablet Take 200 mg by mouth every morning.  . dabigatran (PRADAXA) 150 MG CAPS capsule Take 1 capsule (150 mg total) by mouth 2 (two) times daily.  Medicaid [id number] O  . diltiazem (CARDIZEM CD) 240 MG 24 hr capsule Take 1 capsule (240 mg total) by mouth daily. Medicaid [id number] O  . Ergocalciferol (VITAMIN D2) 2000 units TABS Take 1 tablet by mouth daily.  . folic acid (FOLVITE) 1 MG tablet Take 1 tablet (1 mg total) by mouth daily.  Marland Kitchen losartan (COZAAR) 25 MG tablet Take 1 tablet (25 mg total) by mouth daily. Medicaid [id number] O  . metoprolol succinate (TOPROL-XL) 50 MG 24 hr tablet Take 1 tablet (50 mg total) by mouth daily. Take with or immediately following a meal. Medicaid [id number] O  . mometasone-formoterol (DULERA) 200-5 MCG/ACT AERO Inhale 2 puffs into the lungs 2 (two) times daily. Medicaid [id number] O  . Multiple Vitamin (MULTIVITAMIN WITH MINERALS) TABS tablet Take 1 tablet by mouth daily.  . OXYGEN 3lpm 24/7  [facility]  . sertraline (ZOLOFT) 100 MG tablet Take 100 mg by mouth daily.  Marland Kitchen thiamine 100 MG tablet Take 1 tablet (100 mg total) by mouth daily.  Marland Kitchen tiotropium (SPIRIVA) 18 MCG inhalation capsule Place 1 capsule (18 mcg total) into inhaler and inhale daily.     Allergies: Allergies as of 03/10/2019  . (No Known Allergies)   Past Medical History:  Diagnosis Date  . AF (atrial fibrillation) (HCC)   . Alcohol abuse   . Angina  pectoris   . CAD (coronary artery disease) 12/15/2004  . COPD (chronic obstructive pulmonary disease) ([place])   . Coronary artery disease   . Heavy cigarette smoker   . HTN (hypertension)   . Idiopathic cardiomyopathy ([place]) 07/16/2009  . Non compliance with medical treatment   . Seizures ([place])     Family History:  Family History  Problem Relation Age of Onset  . Suicidality Sister   . CVA Mother   . Stroke Mother   . Heart disease Father   . Coronary artery disease Father   . COPD Father     Social History: Patient lives alone.  Has been having difficulty with ADLs due to shortness of breath.  Does not have family/friends support in [place].  He has a history of alcohol use disorder but  reports cutting down and has only been drinking 1-2 beers per day.  He also smokes tobacco, about half a pack per day.   Review of Systems: A complete ROS was negative except as per HPI.   Physical Exam: Blood pressure (!) 135/96, pulse 87, temperature 97.7 F (36.5 C), temperature source Oral, resp. rate (!) 30, height 5\' 11"  (1.803 m), weight 68 kg, SpO2 91 %.  General: Chronically ill-appearing male, mildly dyspneic but in no acute distress HENT: NCAT, neck supple and FROM, OP clear without exudates or erythema, dry MM, poor dentition  Eyes: anicteric sclera, PERRL Cardiac: Irregularly irregular rhythm, nl S1/S2, no murmurs, rubs or gallops, no JVD Pulm: Poor air movement with diffuse mild expiratory wheezing, no increased work of breathing on 3L of supplemental oxygen Abd: Distended but soft, tenderness to palpation over epigastrium and left quadrants, bowel sounds are hypoactive   Neuro: A&Ox3, motor strength and sensation intact in all 4 extremities  Ext: warm and well perfused, no peripheral edema Derm: few excoriations in bilateral LE that appear to be healing well without signs of infection    EKG: personally reviewed my interpretation is low voltage, afib, prolonged QT   CXR: personally reviewed my interpretation is hyperinflated lungs, no opacities or consolidations   Assessment & Plan by Problem: Principal Problem:   COPD exacerbation (HCC) Active Problems:   AKI (acute kidney injury) (HCC)   Epigastric pain  # COPD exacerbation: Patient has a history of severe COPD and presents with a 1 week history of dyspnea and increased sputum production suggestive of COPD exacerbation.  No signs of infection present at this time (no leukocytosis, no opacities on CXR, Covid negative).  He is compliant with home inhalers -Spiriva, Dulera, albuterol PRN. He uses 3L of supplemental oxygen at home which he has not had to increase in the past week. He expressed his desire to be able to do  the things he needs to at home by himself without getting short of breath. He does not have any kind of support and is trying to maintain his independence as much as he can. We discussed his COPD diagnosis, how his lung disease is irreversible, and what to expect. I discussed I expect he will feel better after we treat his exacerbation but that we will not be able to "fix" his chronic dyspnea. I advised him the best thing he can do at this time is stop smoking and continue using his home O2 adequately.  - Continue home supplemental O2 at 3L with goal O2 sat 88-92%  - S/p Solumedrol 125 mg x1 by EMS --> prednisone 40 mg !D x 4 day starting 1/29 - Azithromycin 500  mg x1 followed by 250 mg QD x 4 days  - PT consult  - Counseled on smoking cessation   # Epigastric pain: This is chronic in nature and associated with poor appetite. No N/V. I suspect secondary to GERD given radiation up into the chest. Gastritis and pancreatitis also possible in the setting of alcohol use. CT abdomen/pelvis performed 01/2019 did not show any acute intra-abdominal process. It did show a mild compression fracture at T6, but he denies back pain today. Low suspicion for cardiac etiology given EKG without signs of acute ischemia. - Follow up lipase - Start Protonix 40 mg QD prior to breakfast   # CAD # Atrial fibrillation  # HTN  EKG shows atrial fibrillation. Currently rate controlled. Continue home amiodarone, metoprolol, losartan, cardizem, and pradaxa for [facility].    Diet: HH VTE ppx: home pradaxa  CODE STATUS: DNR/DNI, confirmed with patient on admission   Dispo: Admit patient to Observation with expected length of stay less than 2 midnights.  SignedBurna Cash, MD 03/10/2019, 10:22 AM  Pager: [phone]

## 2019-03-10 NOTE — Evaluation (Signed)
Physical Therapy Evaluation Patient Details Name: Johnny Rodriguez MRN: [id number] DOB: 1950/06/20 Today's Date: 03/10/2019   History of Present Illness  Pt is a 69 y/o male admitted secondary to SOB and fall. PMH includes CHF, COPD on 3L, a fib, CAD, HTN, and seizures.   Clinical Impression  Pt admitted secondary to problem above with deficits below. Pt with limited tolerance secondary to increased SOB and abdominal pain. Required min guard A to stand using RW. Pt oxygen sats at 90-91% on 3L. Pt reports he plans to return to motel where he is staying, however, does not have any support at home. If mobility tolerance does not improve, may require SNF level therapies at d/c. Will continue to follow acutely to maximize functional mobility independence and safety.     Follow Up Recommendations Home health PT;Supervision for mobility/OOB(vs SNF pending progression )    Equipment Recommendations  Other (comment);3in1 (PT)(4 wheeled walker with seat (rollator))    Recommendations for Other Services       Precautions / Restrictions Precautions Precautions: Fall;Other (comment) Precaution Comments: watch oxygen sats  Restrictions Weight Bearing Restrictions: No      Mobility  Bed Mobility Overal bed mobility: Needs Assistance Bed Mobility: Supine to Sit;Sit to Supine     Supine to sit: Min guard Sit to supine: Min guard   General bed mobility comments: Min guard for safety. Increased SOB at EOB, however, oxygen sats at 91% on 3L. Educated about pursed lip breathing to calm breathing.   Transfers Overall transfer level: Needs assistance Equipment used: Rolling walker (2 wheeled) Transfers: Sit to/from Stand Sit to Stand: Min guard         General transfer comment: Min guard for safety. Cues for safe hand placement. Pt with increased SOB and abdominal pain, so further mobility deferred. Oxygen sats at 90% on 3L.   Ambulation/Gait                Stairs             Wheelchair Mobility    Modified Rankin (Stroke Patients Only)       Balance Overall balance assessment: Needs assistance Sitting-balance support: No upper extremity supported;Feet supported Sitting balance-Leahy Scale: Fair     Standing balance support: Bilateral upper extremity supported;During functional activity Standing balance-Leahy Scale: Poor Standing balance comment: Reliant on BUE support                              Pertinent Vitals/Pain Pain Assessment: Faces Faces Pain Scale: Hurts whole lot Pain Location: abdomen Pain Descriptors / Indicators: Grimacing;Guarding Pain Intervention(s): Monitored during session;Limited activity within patient's tolerance;Repositioned    Home Living Family/patient expects to be discharged to:: Private residence Living Arrangements: Other (Comment)(motel) Available Help at Discharge: (reports none ) Type of Home: Other(Comment)(motel) Home Access: Level entry     Home Layout: One level Home Equipment: Cane - single point      Prior Function Level of Independence: Independent with assistive device(s)         Comments: HAs been using cane, but has had increased difficulty with ambulation      Hand Dominance        Extremity/Trunk Assessment   Upper Extremity Assessment Upper Extremity Assessment: Generalized weakness    Lower Extremity Assessment Lower Extremity Assessment: Generalized weakness    Cervical / Trunk Assessment Cervical / Trunk Assessment: Normal  Communication   Communication: No difficulties  Cognition  Arousal/Alertness: Awake/alert Behavior During Therapy: Restless Overall Cognitive Status: No family/caregiver present to determine baseline cognitive functioning                                 General Comments: Pt concerned about his abdominal pain throughout, repeating that he felt he needed surgery. Educated that he would have to speak to his physician about  that.       General Comments General comments (skin integrity, edema, etc.): Pt reports he plans to go to his motel at d/c    Exercises     Assessment/Plan    PT Assessment Patient needs continued PT services  PT Problem List Decreased strength;Decreased balance;Decreased activity tolerance;Decreased mobility;Decreased knowledge of use of DME;Decreased knowledge of precautions;Decreased safety awareness;Pain       PT Treatment Interventions Gait training;Functional mobility training;Therapeutic activities;DME instruction;Therapeutic exercise;Balance training;Patient/family education    PT Goals (Current goals can be found in the Care Plan section)  Acute Rehab PT Goals Patient Stated Goal: to decrease abdominal pain  PT Goal Formulation: With patient Time For Goal Achievement: 03/24/19 Potential to Achieve Goals: Good    Frequency Min 3X/week   Barriers to discharge Decreased caregiver support      Co-evaluation               AM-PAC PT "6 Clicks" Mobility  Outcome Measure Help needed turning from your back to your side while in a flat bed without using bedrails?: A Little Help needed moving from lying on your back to sitting on the side of a flat bed without using bedrails?: A Little Help needed moving to and from a bed to a chair (including a wheelchair)?: A Little Help needed standing up from a chair using your arms (e.g., wheelchair or bedside chair)?: A Little Help needed to walk in hospital room?: A Lot Help needed climbing 3-5 steps with a railing? : A Lot 6 Click Score: 16    End of Session Equipment Utilized During Treatment: Gait belt Activity Tolerance: Treatment limited secondary to medical complications (Comment);Patient limited by pain(increased SOB ) Patient left: in bed;with call bell/phone within reach;with bed alarm set Nurse Communication: Mobility status;Other (comment)(pt reporting increased abdominal pain) PT Visit Diagnosis: Difficulty in  walking, not elsewhere classified (R26.2);Muscle weakness (generalized) (M62.81)    Time: 5009-3818 PT Time Calculation (min) (ACUTE ONLY): 24 min   Charges:   PT Evaluation $PT Eval Moderate Complexity: 1 Mod PT Treatments $Therapeutic Activity: 8-22 mins        Lou Miner, DPT  Acute Rehabilitation Services  Pager: [phone] Office: [phone]   Rudean Hitt 03/10/2019, 6:00 PM

## 2019-03-10 NOTE — ED Triage Notes (Signed)
Pt brought to ED from home via EMS with c/o worsening shortness of breath unrelieved by rescue inhaler. Pt states he was fell about a week ago onto his side and was brought to the ED for assessment. Hx of CHF, COPD. Uses 3L [place] at baseline. Pt is A&O x4.

## 2019-03-11 ENCOUNTER — Inpatient Hospital Stay (HOSPITAL_COMMUNITY): Payer: Medicare Other

## 2019-03-11 ENCOUNTER — Observation Stay (HOSPITAL_COMMUNITY): Payer: Medicare Other

## 2019-03-11 DIAGNOSIS — Z7901 Long term (current) use of anticoagulants: Secondary | ICD-10-CM

## 2019-03-11 DIAGNOSIS — R1013 Epigastric pain: Secondary | ICD-10-CM

## 2019-03-11 DIAGNOSIS — Z20822 Contact with and (suspected) exposure to covid-19: Secondary | ICD-10-CM | POA: Diagnosis present

## 2019-03-11 DIAGNOSIS — G8929 Other chronic pain: Secondary | ICD-10-CM

## 2019-03-11 DIAGNOSIS — Z9981 Dependence on supplemental oxygen: Secondary | ICD-10-CM | POA: Diagnosis not present

## 2019-03-11 DIAGNOSIS — K5669 Other partial intestinal obstruction: Secondary | ICD-10-CM | POA: Diagnosis present

## 2019-03-11 DIAGNOSIS — I2510 Atherosclerotic heart disease of native coronary artery without angina pectoris: Secondary | ICD-10-CM | POA: Diagnosis present

## 2019-03-11 DIAGNOSIS — I110 Hypertensive heart disease with heart failure: Secondary | ICD-10-CM | POA: Diagnosis present

## 2019-03-11 DIAGNOSIS — J441 Chronic obstructive pulmonary disease with (acute) exacerbation: Secondary | ICD-10-CM | POA: Diagnosis present

## 2019-03-11 DIAGNOSIS — E875 Hyperkalemia: Secondary | ICD-10-CM | POA: Diagnosis not present

## 2019-03-11 DIAGNOSIS — I480 Paroxysmal atrial fibrillation: Secondary | ICD-10-CM | POA: Diagnosis present

## 2019-03-11 DIAGNOSIS — Z823 Family history of stroke: Secondary | ICD-10-CM | POA: Diagnosis not present

## 2019-03-11 DIAGNOSIS — F17210 Nicotine dependence, cigarettes, uncomplicated: Secondary | ICD-10-CM | POA: Diagnosis present

## 2019-03-11 DIAGNOSIS — Z8249 Family history of ischemic heart disease and other diseases of the circulatory system: Secondary | ICD-10-CM | POA: Diagnosis not present

## 2019-03-11 DIAGNOSIS — J9601 Acute respiratory failure with hypoxia: Secondary | ICD-10-CM | POA: Diagnosis not present

## 2019-03-11 DIAGNOSIS — I5032 Chronic diastolic (congestive) heart failure: Secondary | ICD-10-CM | POA: Diagnosis present

## 2019-03-11 DIAGNOSIS — K5909 Other constipation: Secondary | ICD-10-CM | POA: Diagnosis present

## 2019-03-11 DIAGNOSIS — Z825 Family history of asthma and other chronic lower respiratory diseases: Secondary | ICD-10-CM | POA: Diagnosis not present

## 2019-03-11 DIAGNOSIS — Z66 Do not resuscitate: Secondary | ICD-10-CM | POA: Diagnosis present

## 2019-03-11 DIAGNOSIS — I5030 Unspecified diastolic (congestive) heart failure: Secondary | ICD-10-CM

## 2019-03-11 DIAGNOSIS — R110 Nausea: Secondary | ICD-10-CM

## 2019-03-11 DIAGNOSIS — I429 Cardiomyopathy, unspecified: Secondary | ICD-10-CM | POA: Diagnosis present

## 2019-03-11 DIAGNOSIS — R140 Abdominal distension (gaseous): Secondary | ICD-10-CM

## 2019-03-11 DIAGNOSIS — Z79899 Other long term (current) drug therapy: Secondary | ICD-10-CM | POA: Diagnosis not present

## 2019-03-11 DIAGNOSIS — K219 Gastro-esophageal reflux disease without esophagitis: Secondary | ICD-10-CM | POA: Diagnosis present

## 2019-03-11 DIAGNOSIS — J9621 Acute and chronic respiratory failure with hypoxia: Secondary | ICD-10-CM | POA: Diagnosis present

## 2019-03-11 DIAGNOSIS — N179 Acute kidney failure, unspecified: Secondary | ICD-10-CM | POA: Diagnosis present

## 2019-03-11 LAB — CBC WITH DIFFERENTIAL/PLATELET
Abs Immature Granulocytes: 0.23 10*3/uL — ABNORMAL HIGH (ref 0.00–0.07)
Basophils Absolute: 0 10*3/uL (ref 0.0–0.1)
Basophils Relative: 0 %
Eosinophils Absolute: 0 10*3/uL (ref 0.0–0.5)
Eosinophils Relative: 0 %
HCT: 42 % (ref 39.0–52.0)
Hemoglobin: 13.6 g/dL (ref 13.0–17.0)
Immature Granulocytes: 1 %
Lymphocytes Relative: 2 %
Lymphs Abs: 0.5 10*3/uL — ABNORMAL LOW (ref 0.7–4.0)
MCH: 30.6 pg (ref 26.0–34.0)
MCHC: 32.4 g/dL (ref 30.0–36.0)
MCV: 94.4 fL (ref 80.0–100.0)
Monocytes Absolute: 0.8 10*3/uL (ref 0.1–1.0)
Monocytes Relative: 4 %
Neutro Abs: 18 10*3/uL — ABNORMAL HIGH (ref 1.7–7.7)
Neutrophils Relative %: 93 %
Platelets: 258 10*3/uL (ref 150–400)
RBC: 4.45 MIL/uL (ref 4.22–5.81)
RDW: 14.2 % (ref 11.5–15.5)
WBC: 19.6 10*3/uL — ABNORMAL HIGH (ref 4.0–10.5)
nRBC: 0 % (ref 0.0–0.2)

## 2019-03-11 LAB — BASIC METABOLIC PANEL
Anion gap: 7 (ref 5–15)
BUN: 28 mg/dL — ABNORMAL HIGH (ref 8–23)
CO2: 26 mmol/L (ref 22–32)
Calcium: 9.1 mg/dL (ref 8.9–10.3)
Chloride: 103 mmol/L (ref 98–111)
Creatinine, Ser: 1.34 mg/dL — ABNORMAL HIGH (ref 0.61–1.24)
GFR calc Af Amer: >60 mL/min (ref >60)
GFR calc non Af Amer: 55 mL/min — ABNORMAL LOW (ref >60)
Glucose, Bld: 165 mg/dL — ABNORMAL HIGH (ref 70–99)
Potassium: 5 mmol/L (ref 3.5–5.1)
Sodium: 136 mmol/L (ref 135–145)

## 2019-03-11 LAB — HEPATITIS C ANTIBODY: HCV Ab: NONREACTIVE

## 2019-03-11 MED ORDER — IOHEXOL 300 MG/ML  SOLN
100.0000 mL | Freq: Once | INTRAMUSCULAR | Status: AC | PRN
  Administered 2019-03-11: 100 mL via INTRAVENOUS

## 2019-03-11 MED ORDER — DEXTROSE 5 % IV SOLN
250.0000 mg | INTRAVENOUS | Status: DC
  Administered 2019-03-12: 250 mg via INTRAVENOUS
  Filled 2019-03-11 (×2): qty 250

## 2019-03-11 MED ORDER — LACTATED RINGERS IV SOLN: INTRAVENOUS | Status: DC

## 2019-03-11 MED ORDER — METHYLPREDNISOLONE SODIUM SUCC 40 MG IJ SOLR
40.0000 mg | INTRAMUSCULAR | Status: DC
  Administered 2019-03-12: 40 mg via INTRAVENOUS
  Filled 2019-03-11: qty 1

## 2019-03-11 MED ORDER — IPRATROPIUM-ALBUTEROL 0.5-2.5 (3) MG/3ML IN SOLN
3.0000 mL | Freq: Three times a day (TID) | RESPIRATORY_TRACT | Status: DC
  Administered 2019-03-11 (×3): 3 mL via RESPIRATORY_TRACT
  Filled 2019-03-11 (×3): qty 3

## 2019-03-11 MED ORDER — ENOXAPARIN SODIUM 40 MG/0.4ML ~~LOC~~ SOLN
40.0000 mg | SUBCUTANEOUS | Status: DC
  Administered 2019-03-11 – 2019-03-14 (×4): 40 mg via SUBCUTANEOUS
  Filled 2019-03-11 (×4): qty 0.4

## 2019-03-11 MED ORDER — BISACODYL 10 MG RE SUPP
10.0000 mg | Freq: Once | RECTAL | Status: AC
  Administered 2019-03-11: 10 mg via RECTAL
  Filled 2019-03-11: qty 1

## 2019-03-11 MED ORDER — HYDROMORPHONE HCL 1 MG/ML IJ SOLN: 0.5000 mg | INTRAMUSCULAR | Status: DC | PRN

## 2019-03-11 MED ORDER — LACTATED RINGERS IV SOLN: INTRAVENOUS | Status: AC

## 2019-03-11 NOTE — Progress Notes (Signed)
Subjective:   [name] is not feeling well this morning due to abdominal pain which has been an intermittent problem for him for a while now. He feels abdomen is distended. Denies vomiting. He has not had a bowel movement, but is passing flatus. He is tolerating PO. He will eat despite not having an appetite.   He also endorses continued shortness of breath with wheezing.   Objective:  Vital signs in last 24 hours: Vitals:   03/10/19 1945 03/11/19 0058 03/11/19 0610 03/11/19 0611  BP:  126/81 115/77   Pulse:  71  81  Resp:  15 18   Temp:  98 F (36.7 C) (!) 97.5 F (36.4 C)   TempSrc:  Oral Oral   SpO2: 90% 96%  96%  Weight:      Height:       Physical Exam Vitals and nursing note reviewed.  Constitutional:      General: He is not in acute distress.    Appearance: He is normal weight.  Pulmonary:     Effort: Pulmonary effort is normal. Tachypnea present.     Breath sounds: Wheezing present.  Abdominal:     General: There is distension. There are no signs of injury.     Palpations: Abdomen is rigid.     Tenderness: There is generalized abdominal tenderness (Left > Right). There is guarding.  Musculoskeletal:     Right lower leg: No tenderness.     Left lower leg: No tenderness.  Skin:    General: Skin is warm and dry.  Neurological:     General: No focal deficit present.     Mental Status: He is alert and oriented to person, place, and time.  Psychiatric:        Mood and Affect: Mood normal.        Behavior: Behavior normal.    Assessment/Plan:  Principal Problem:   COPD exacerbation (HCC) Active Problems:   AKI (acute kidney injury) ([place])   Epigastric pain  [name] is a 69 y/o male with a PMHx of COPD on 3L supplemental home oxygen, PAF on Pradaxa, HFpEF, who presented to the ED with SOB concerning for COPD exacerbation.   # Acute Hypoxic Respiratory Failure  # COPD Exacerbation  [name] continues to have significant wheezing on examination  with high than baseline supplemental oxygen requirement @ 6L [place].   - Supplemental oxygen PRN to maintain O2 saturation > 88% - Prednisone 40 mg (day 1 out of 4)  - Azithromycin 250 mg (day 2 out of 5)   # Abdominal Pain with Distention [name] endorses chronic epigastric abdominal pain with radiation to his chest, consistent with GERD. However, this AM he is having worsened distention and severe abdominal pain in all quadrants, more on the left, guarding when percussing. Differential includes SBO although patient is passing flatus, vs pancreatitis but lipase on admission was 22. His AST and ALT are WNL, so unlikely to be acute hepatitis. KUB was obtained and showed developing SBO in the left small bowel. Diet changed to NPO and NGT was placed to suction. Obtaining a CT A/P at this time for further evaluation of possible adhesions or transition point. Will add Dilaudid for severe pain but will limit number of doses due to GI side effects.   - Telemetry  - NPO  - NGT to suction  - CT A/P pending - Dilaudid 0.5 mg q3h PRN x 3 doses max     Dispo:  Anticipated discharge pending further medical improvement.   Dr. Verdene Rodriguez Internal Medicine PGY-1  Pager: [phone] 03/11/2019, 6:53 AM

## 2019-03-11 NOTE — Progress Notes (Signed)
Physical Therapy Treatment Patient Details Name: Johnny Rodriguez MRN: [id number] DOB: 11/05/1950 Today's Date: 03/11/2019    History of Present Illness Pt is a 69 y/o male admitted secondary to SOB and fall. PMH includes CHF, COPD on 3L, a fib, CAD, HTN, and seizures.     PT Comments    Pt performed gt training progression with close chair follow but did not require seated rest break.  He presents with decreased strength and poor safety awareness. Plan for progression to rollator next visit.  Will continue to follow for appropriate d/c recommendations.    Follow Up Recommendations  Home health PT;Supervision for mobility/OOB(vs. snf pending progression.)     Equipment Recommendations  Other (comment);3in1 (PT)(4 wheeled RW with seat)    Recommendations for Other Services       Precautions / Restrictions Precautions Precautions: Fall;Other (comment) Precaution Comments: watch oxygen sats  Restrictions Weight Bearing Restrictions: No    Mobility  Bed Mobility Overal bed mobility: Needs Assistance Bed Mobility: Supine to Sit     Supine to sit: Min guard     General bed mobility comments: Min guard with increased time to come to sitting.  Transfers Overall transfer level: Needs assistance Equipment used: Rolling walker (2 wheeled) Transfers: Sit to/from Stand Sit to Stand: Min assist         General transfer comment: Min assistance to boost into standing.  Ambulation/Gait Ambulation/Gait assistance: Min guard Gait Distance (Feet): 180 Feet Assistive device: Rolling walker (2 wheeled) Gait Pattern/deviations: Step-through pattern;Trunk flexed     General Gait Details: Cues for RW safety and upper trunk control.  Pt with increased DOE.  6L [place].   Stairs             Wheelchair Mobility    Modified Rankin (Stroke Patients Only)       Balance Overall balance assessment: Needs assistance Sitting-balance support: No upper extremity supported;Feet  supported Sitting balance-Leahy Scale: Fair     Standing balance support: Bilateral upper extremity supported;During functional activity Standing balance-Leahy Scale: Poor                              Cognition Arousal/Alertness: Awake/alert Behavior During Therapy: WFL for tasks assessed/performed Overall Cognitive Status: No family/caregiver present to determine baseline cognitive functioning                                 General Comments: Pt followed all commands during session.      Exercises      General Comments        Pertinent Vitals/Pain Pain Assessment: Faces Faces Pain Scale: Hurts whole lot Pain Location: abdomen Pain Descriptors / Indicators: Grimacing;Guarding Pain Intervention(s): Monitored during session;Repositioned    Home Living                      Prior Function            PT Goals (current goals can now be found in the care plan section) Acute Rehab PT Goals Patient Stated Goal: to decrease abdominal pain  Potential to Achieve Goals: Good Progress towards PT goals: Progressing toward goals    Frequency    Min 3X/week      PT Plan Current plan remains appropriate    Co-evaluation              AM-PAC PT "  6 Clicks" Mobility   Outcome Measure  Help needed turning from your back to your side while in a flat bed without using bedrails?: A Little Help needed moving from lying on your back to sitting on the side of a flat bed without using bedrails?: A Little Help needed moving to and from a bed to a chair (including a wheelchair)?: A Little Help needed standing up from a chair using your arms (e.g., wheelchair or bedside chair)?: A Little Help needed to walk in hospital room?: A Little Help needed climbing 3-5 steps with a railing? : A Lot 6 Click Score: 17    End of Session Equipment Utilized During Treatment: Gait belt Activity Tolerance: Treatment limited secondary to medical complications  (Comment);Patient limited by pain Patient left: in bed;with call bell/phone within reach;with bed alarm set Nurse Communication: Mobility status PT Visit Diagnosis: Difficulty in walking, not elsewhere classified (R26.2);Muscle weakness (generalized) (M62.81)     Time: 2831-5176 PT Time Calculation (min) (ACUTE ONLY): 28 min  Charges:  $Gait Training: 8-22 mins $Therapeutic Activity: 8-22 mins                     Bonney Leitz , PTA Acute Rehabilitation Services Pager [phone] Office [phone]     Johnny Rodriguez Delay 03/11/2019, 4:55 PM

## 2019-03-12 DIAGNOSIS — R109 Unspecified abdominal pain: Secondary | ICD-10-CM | POA: Diagnosis not present

## 2019-03-12 DIAGNOSIS — G8929 Other chronic pain: Secondary | ICD-10-CM | POA: Diagnosis not present

## 2019-03-12 DIAGNOSIS — J9601 Acute respiratory failure with hypoxia: Secondary | ICD-10-CM | POA: Diagnosis not present

## 2019-03-12 DIAGNOSIS — J441 Chronic obstructive pulmonary disease with (acute) exacerbation: Secondary | ICD-10-CM | POA: Diagnosis not present

## 2019-03-12 LAB — COMPREHENSIVE METABOLIC PANEL
ALT: 24 U/L (ref 0–44)
AST: 20 U/L (ref 15–41)
Albumin: 3.5 g/dL (ref 3.5–5.0)
Alkaline Phosphatase: 59 U/L (ref 38–126)
Anion gap: 8 (ref 5–15)
BUN: 30 mg/dL — ABNORMAL HIGH (ref 8–23)
CO2: 30 mmol/L (ref 22–32)
Calcium: 9.1 mg/dL (ref 8.9–10.3)
Chloride: 98 mmol/L (ref 98–111)
Creatinine, Ser: 1.21 mg/dL (ref 0.61–1.24)
GFR calc Af Amer: >60 mL/min (ref >60)
GFR calc non Af Amer: >60 mL/min (ref >60)
Glucose, Bld: 150 mg/dL — ABNORMAL HIGH (ref 70–99)
Potassium: 5.1 mmol/L (ref 3.5–5.1)
Sodium: 136 mmol/L (ref 135–145)
Total Bilirubin: 0.4 mg/dL (ref 0.3–1.2)
Total Protein: 6.4 g/dL — ABNORMAL LOW (ref 6.5–8.1)

## 2019-03-12 LAB — CBC WITH DIFFERENTIAL/PLATELET
Abs Immature Granulocytes: 0.12 10*3/uL — ABNORMAL HIGH (ref 0.00–0.07)
Basophils Absolute: 0 10*3/uL (ref 0.0–0.1)
Basophils Relative: 0 %
Eosinophils Absolute: 0 10*3/uL (ref 0.0–0.5)
Eosinophils Relative: 0 %
HCT: 41.2 % (ref 39.0–52.0)
Hemoglobin: 13 g/dL (ref 13.0–17.0)
Immature Granulocytes: 1 %
Lymphocytes Relative: 4 %
Lymphs Abs: 0.7 10*3/uL (ref 0.7–4.0)
MCH: 30.2 pg (ref 26.0–34.0)
MCHC: 31.6 g/dL (ref 30.0–36.0)
MCV: 95.8 fL (ref 80.0–100.0)
Monocytes Absolute: 1 10*3/uL (ref 0.1–1.0)
Monocytes Relative: 6 %
Neutro Abs: 14.4 10*3/uL — ABNORMAL HIGH (ref 1.7–7.7)
Neutrophils Relative %: 89 %
Platelets: 244 10*3/uL (ref 150–400)
RBC: 4.3 MIL/uL (ref 4.22–5.81)
RDW: 14.4 % (ref 11.5–15.5)
WBC: 16.2 10*3/uL — ABNORMAL HIGH (ref 4.0–10.5)
nRBC: 0 % (ref 0.0–0.2)

## 2019-03-12 MED ORDER — PREDNISONE 20 MG PO TABS
40.0000 mg | ORAL_TABLET | Freq: Every day | ORAL | Status: DC
  Administered 2019-03-13 – 2019-03-15 (×3): 40 mg via ORAL
  Filled 2019-03-12 (×3): qty 2

## 2019-03-12 MED ORDER — FLEET ENEMA 7-19 GM/118ML RE ENEM: 1.0000 | ENEMA | Freq: Every day | RECTAL | Status: DC | PRN

## 2019-03-12 MED ORDER — AZITHROMYCIN 250 MG PO TABS
250.0000 mg | ORAL_TABLET | Freq: Every day | ORAL | Status: DC
  Administered 2019-03-13 – 2019-03-15 (×3): 250 mg via ORAL
  Filled 2019-03-12 (×3): qty 1

## 2019-03-12 MED ORDER — IPRATROPIUM-ALBUTEROL 0.5-2.5 (3) MG/3ML IN SOLN
3.0000 mL | RESPIRATORY_TRACT | Status: DC | PRN
  Administered 2019-03-12: 3 mL via RESPIRATORY_TRACT
  Filled 2019-03-12: qty 3

## 2019-03-12 NOTE — Progress Notes (Signed)
   Subjective: Pt seen at the bedside this morning. States his belly is improving. Endorses flatus, but denies BMs. He states the only thing that works for his constipation is a fleets enema which he requests today. Was able to get up and walk around the hallway yesterday. Breathing was stable during ambulation.  Objective:  Vital signs in last 24 hours: Vitals:   03/11/19 2310 03/12/19 0457 03/12/19 0856 03/12/19 0859  BP: (!) 109/55 110/61    Pulse: 89 76 88   Resp: 17 17 19    Temp: 97.6 F (36.4 C) 98.4 F (36.9 C)    TempSrc: Oral Oral    SpO2: 94% 97% 95% 95%  Weight:  67.9 kg    Height:       General: awake, alert, lying in bed in NAD CV: RRR Pulm: normal work of breathing; decreased air movement with diffuse wheezing in all fields Abd: BS+; abdomen is distended with mild TTP diffusely   Assessment/Plan:  Principal Problem:   COPD exacerbation (HCC) Active Problems:   AKI (acute kidney injury) (HCC)   Bowel obstruction (HCC)   COPD with acute exacerbation [facility])  Johnny Rodriguez is a 69 y/o male with a PMHx of COPD on 3L supplemental home oxygen, PAF on Pradaxa, HFpEF, who presented to the ED with SOB concerning for COPD exacerbation.   # Acute Hypoxic Respiratory Failure  # COPD Exacerbation  Johnny Rodriguez continues to have significant wheezing on exam, but has weaned to 4L [place] and is making progress clinically with ability to ambulate without significant respiratory distress     - continue weaning O2 as tolerated  - PT/OT efforts - He is on day 3/5 of Azithromycin and Prednisone   # Abdominal Pain with Distention - CT done yesterday evening after NGT had been placed for several hours which was negative for obstruction  - his abdomen is still quite distended but notes less pain today and was able to eat last night - he endorses chronic issues with constipation which I suspect is the primary driver of his current symptoms; did not have BM with dulcolax suppository;  will try Fleets enema today   Dispo: Anticipated discharge pending further clinical improvement.   Earlene Plater D, DO 03/12/2019, 10:23 AM Pager: [phone]

## 2019-03-13 DIAGNOSIS — G8929 Other chronic pain: Secondary | ICD-10-CM | POA: Diagnosis not present

## 2019-03-13 DIAGNOSIS — J441 Chronic obstructive pulmonary disease with (acute) exacerbation: Secondary | ICD-10-CM | POA: Diagnosis not present

## 2019-03-13 DIAGNOSIS — J9601 Acute respiratory failure with hypoxia: Secondary | ICD-10-CM | POA: Diagnosis not present

## 2019-03-13 DIAGNOSIS — R1013 Epigastric pain: Secondary | ICD-10-CM | POA: Diagnosis not present

## 2019-03-13 LAB — COMPREHENSIVE METABOLIC PANEL
ALT: 22 U/L (ref 0–44)
AST: 17 U/L (ref 15–41)
Albumin: 3.2 g/dL — ABNORMAL LOW (ref 3.5–5.0)
Alkaline Phosphatase: 56 U/L (ref 38–126)
Anion gap: 7 (ref 5–15)
BUN: 20 mg/dL (ref 8–23)
CO2: 32 mmol/L (ref 22–32)
Calcium: 9 mg/dL (ref 8.9–10.3)
Chloride: 98 mmol/L (ref 98–111)
Creatinine, Ser: 0.97 mg/dL (ref 0.61–1.24)
GFR calc Af Amer: >60 mL/min (ref >60)
GFR calc non Af Amer: >60 mL/min (ref >60)
Glucose, Bld: 170 mg/dL — ABNORMAL HIGH (ref 70–99)
Potassium: 5 mmol/L (ref 3.5–5.1)
Sodium: 137 mmol/L (ref 135–145)
Total Bilirubin: 0.5 mg/dL (ref 0.3–1.2)
Total Protein: 6 g/dL — ABNORMAL LOW (ref 6.5–8.1)

## 2019-03-13 LAB — CBC
HCT: 39.7 % (ref 39.0–52.0)
Hemoglobin: 12.9 g/dL — ABNORMAL LOW (ref 13.0–17.0)
MCH: 30.4 pg (ref 26.0–34.0)
MCHC: 32.5 g/dL (ref 30.0–36.0)
MCV: 93.6 fL (ref 80.0–100.0)
Platelets: 222 10*3/uL (ref 150–400)
RBC: 4.24 MIL/uL (ref 4.22–5.81)
RDW: 14.1 % (ref 11.5–15.5)
WBC: 12.8 10*3/uL — ABNORMAL HIGH (ref 4.0–10.5)
nRBC: 0 % (ref 0.0–0.2)

## 2019-03-13 MED ORDER — BISACODYL 5 MG PO TBEC
5.0000 mg | DELAYED_RELEASE_TABLET | Freq: Once | ORAL | Status: AC
  Administered 2019-03-13: 5 mg via ORAL
  Filled 2019-03-13: qty 1

## 2019-03-13 MED ORDER — SENNA 8.6 MG PO TABS
2.0000 | ORAL_TABLET | Freq: Every day | ORAL | Status: DC
  Administered 2019-03-13 – 2019-03-14 (×2): 17.2 mg via ORAL
  Filled 2019-03-13 (×2): qty 2

## 2019-03-13 MED ORDER — DOCUSATE SODIUM 50 MG/5ML PO LIQD
200.0000 mg | Freq: Once | ORAL | Status: AC
  Administered 2019-03-13: 200 mg via ORAL
  Filled 2019-03-13: qty 20

## 2019-03-13 NOTE — Progress Notes (Addendum)
Subjective:   Patient was seen sitting up in bed this morning. He states that he is still distended. He has a few small bm, but states he feels that still has constipation. He would really like this problem to be resolved prior to discharge.   Regarding COPD, he is frustrated that he has to take many breathes due to SOB. He feels his distended stomach does exacerbate it, but wishes there was something to clean his lungs out. We discussed that his multiple home inhalers and home oxygen is the main stay of treatment and unfortunately, there is no way to "clean" the lungs. We did discuss that his current smoking takes away progress that is made with inhalers. He expressed understanding.   Objective:  Vital signs in last 24 hours: Vitals:   03/12/19 1736 03/12/19 2017 03/13/19 0029 03/13/19 0633  BP: 107/63  107/66 129/86  Pulse: 79  66 75  Resp: 18  15 17   Temp: 97.9 F (36.6 C)  (!) 97.3 F (36.3 C) 97.6 F (36.4 C)  TempSrc: Oral  Oral Oral  SpO2: 90% 95% 94% 95%  Weight:      Height:        Physical Exam Vitals and nursing note reviewed.  Constitutional:      Appearance: He is normal weight.  Cardiovascular:     Rate and Rhythm: Normal rate and regular rhythm.     Heart sounds: No murmur.  Pulmonary:     Breath sounds: Wheezing present.  Abdominal:     General: Bowel sounds are normal. There is distension.     Tenderness: There is abdominal tenderness in the right lower quadrant. There is no guarding.  Skin:    General: Skin is warm and dry.  Neurological:     General: No focal deficit present.     Mental Status: He is alert and oriented to person, place, and time.  Psychiatric:        Mood and Affect: Mood normal.        Behavior: Behavior normal.    Assessment/Plan:  Principal Problem:   COPD exacerbation (HCC) Active Problems:   AKI (acute kidney injury) (HCC)   Bowel obstruction (HCC)   COPD with acute exacerbation [facility])  Mr. Seanpaul Preece is a 69 y/o male  with a PMHx of COPD on 3L supplemental home oxygen, PAF on Pradaxa, HFpEF, who presented to the ED with SOB concerning for COPD exacerbation.   # Acute Hypoxic Respiratory Failure  # COPD Exacerbation  On 4L overnight and now currently saturation well on RA. WBC improving. Per chart review, his last F/U with pulm was in 2018 and his last spirometry in 2018, no evidence of prior PFTs. Recommend he re-establish with their clinic. PT recommending HH on 1/29.   - Supplemental oxygen PRN - PT/OT efforts - He is on day 4/5 of Azithromycin and Prednisone   # Abdominal Pain with Distention CT done on 1/29 after NGT had been placed for several hours which was negative for obstruction. Symptoms may be secondary to chronic constipation although he did have 3 small BM yesterday after fleet enema yesterday. No changes in physical examination this AM. WBC improving. Will try combo with Colace, Senna and Dulcolax.   - Colace 200 mg once - Dulcolax 5 mg once - senna 17.2 mg once  - Re-evaluate in the PM    Dispo: Anticipated discharge in approximately 1-2 day(s).   Dr. 2/29 Internal Medicine PGY-1  Pager: [phone]  03/13/2019, 7:30 AM

## 2019-03-14 ENCOUNTER — Encounter (HOSPITAL_COMMUNITY): Payer: Self-pay | Admitting: Student in an Organized Health Care Education/Training Program

## 2019-03-14 DIAGNOSIS — Z823 Family history of stroke: Secondary | ICD-10-CM | POA: Diagnosis not present

## 2019-03-14 DIAGNOSIS — N179 Acute kidney failure, unspecified: Secondary | ICD-10-CM | POA: Diagnosis not present

## 2019-03-14 DIAGNOSIS — J9621 Acute and chronic respiratory failure with hypoxia: Secondary | ICD-10-CM | POA: Diagnosis not present

## 2019-03-14 DIAGNOSIS — I5032 Chronic diastolic (congestive) heart failure: Secondary | ICD-10-CM | POA: Diagnosis not present

## 2019-03-14 DIAGNOSIS — K219 Gastro-esophageal reflux disease without esophagitis: Secondary | ICD-10-CM | POA: Diagnosis not present

## 2019-03-14 DIAGNOSIS — K5909 Other constipation: Secondary | ICD-10-CM | POA: Diagnosis not present

## 2019-03-14 DIAGNOSIS — Z825 Family history of asthma and other chronic lower respiratory diseases: Secondary | ICD-10-CM | POA: Diagnosis not present

## 2019-03-14 DIAGNOSIS — R109 Unspecified abdominal pain: Secondary | ICD-10-CM

## 2019-03-14 DIAGNOSIS — I480 Paroxysmal atrial fibrillation: Secondary | ICD-10-CM | POA: Diagnosis not present

## 2019-03-14 DIAGNOSIS — Z8249 Family history of ischemic heart disease and other diseases of the circulatory system: Secondary | ICD-10-CM | POA: Diagnosis not present

## 2019-03-14 DIAGNOSIS — I429 Cardiomyopathy, unspecified: Secondary | ICD-10-CM | POA: Diagnosis not present

## 2019-03-14 DIAGNOSIS — J441 Chronic obstructive pulmonary disease with (acute) exacerbation: Secondary | ICD-10-CM | POA: Diagnosis not present

## 2019-03-14 DIAGNOSIS — J9601 Acute respiratory failure with hypoxia: Secondary | ICD-10-CM | POA: Diagnosis not present

## 2019-03-14 DIAGNOSIS — Z9981 Dependence on supplemental oxygen: Secondary | ICD-10-CM | POA: Diagnosis not present

## 2019-03-14 DIAGNOSIS — Z66 Do not resuscitate: Secondary | ICD-10-CM | POA: Diagnosis not present

## 2019-03-14 DIAGNOSIS — F17210 Nicotine dependence, cigarettes, uncomplicated: Secondary | ICD-10-CM | POA: Diagnosis not present

## 2019-03-14 DIAGNOSIS — Z20822 Contact with and (suspected) exposure to covid-19: Secondary | ICD-10-CM | POA: Diagnosis not present

## 2019-03-14 DIAGNOSIS — I110 Hypertensive heart disease with heart failure: Secondary | ICD-10-CM | POA: Diagnosis not present

## 2019-03-14 DIAGNOSIS — K5669 Other partial intestinal obstruction: Secondary | ICD-10-CM | POA: Diagnosis not present

## 2019-03-14 DIAGNOSIS — R140 Abdominal distension (gaseous): Secondary | ICD-10-CM | POA: Diagnosis not present

## 2019-03-14 DIAGNOSIS — E875 Hyperkalemia: Secondary | ICD-10-CM | POA: Diagnosis not present

## 2019-03-14 DIAGNOSIS — Z7901 Long term (current) use of anticoagulants: Secondary | ICD-10-CM | POA: Diagnosis not present

## 2019-03-14 DIAGNOSIS — Z79899 Other long term (current) drug therapy: Secondary | ICD-10-CM | POA: Diagnosis not present

## 2019-03-14 DIAGNOSIS — I2510 Atherosclerotic heart disease of native coronary artery without angina pectoris: Secondary | ICD-10-CM | POA: Diagnosis not present

## 2019-03-14 LAB — CBC WITH DIFFERENTIAL/PLATELET
Abs Immature Granulocytes: 0.08 10*3/uL — ABNORMAL HIGH (ref 0.00–0.07)
Basophils Absolute: 0 10*3/uL (ref 0.0–0.1)
Basophils Relative: 0 %
Eosinophils Absolute: 0 10*3/uL (ref 0.0–0.5)
Eosinophils Relative: 0 %
HCT: 40.5 % (ref 39.0–52.0)
Hemoglobin: 12.8 g/dL — ABNORMAL LOW (ref 13.0–17.0)
Immature Granulocytes: 1 %
Lymphocytes Relative: 8 %
Lymphs Abs: 0.9 10*3/uL (ref 0.7–4.0)
MCH: 29.8 pg (ref 26.0–34.0)
MCHC: 31.6 g/dL (ref 30.0–36.0)
MCV: 94.4 fL (ref 80.0–100.0)
Monocytes Absolute: 0.9 10*3/uL (ref 0.1–1.0)
Monocytes Relative: 8 %
Neutro Abs: 9.5 10*3/uL — ABNORMAL HIGH (ref 1.7–7.7)
Neutrophils Relative %: 83 %
Platelets: 250 10*3/uL (ref 150–400)
RBC: 4.29 MIL/uL (ref 4.22–5.81)
RDW: 13.9 % (ref 11.5–15.5)
WBC: 11.3 10*3/uL — ABNORMAL HIGH (ref 4.0–10.5)
nRBC: 0 % (ref 0.0–0.2)

## 2019-03-14 LAB — BASIC METABOLIC PANEL
Anion gap: 8 (ref 5–15)
BUN: 17 mg/dL (ref 8–23)
CO2: 31 mmol/L (ref 22–32)
Calcium: 8.7 mg/dL — ABNORMAL LOW (ref 8.9–10.3)
Chloride: 98 mmol/L (ref 98–111)
Creatinine, Ser: 1.19 mg/dL (ref 0.61–1.24)
GFR calc Af Amer: >60 mL/min (ref >60)
GFR calc non Af Amer: >60 mL/min (ref >60)
Glucose, Bld: 180 mg/dL — ABNORMAL HIGH (ref 70–99)
Potassium: 4.9 mmol/L (ref 3.5–5.1)
Sodium: 137 mmol/L (ref 135–145)

## 2019-03-14 LAB — GLUCOSE, CAPILLARY
Glucose-Capillary: 194 mg/dL — ABNORMAL HIGH (ref 70–99)
Glucose-Capillary: 250 mg/dL — ABNORMAL HIGH (ref 70–99)

## 2019-03-14 MED ORDER — POLYETHYLENE GLYCOL 3350 17 G PO PACK
17.0000 g | PACK | Freq: Two times a day (BID) | ORAL | Status: DC
  Administered 2019-03-14: 17 g via ORAL
  Filled 2019-03-14: qty 1

## 2019-03-14 MED ORDER — POLYETHYLENE GLYCOL 3350 17 G PO PACK
17.0000 g | PACK | Freq: Four times a day (QID) | ORAL | Status: DC
  Administered 2019-03-14 (×2): 17 g via ORAL
  Filled 2019-03-14 (×2): qty 1

## 2019-03-14 MED ORDER — SENNA 8.6 MG PO TABS: 2.0000 | ORAL_TABLET | Freq: Two times a day (BID) | ORAL | Status: DC

## 2019-03-14 MED ORDER — SENNA 8.6 MG PO TABS
2.0000 | ORAL_TABLET | Freq: Once | ORAL | Status: AC
  Administered 2019-03-14: 17.2 mg via ORAL
  Filled 2019-03-14: qty 2

## 2019-03-14 NOTE — Progress Notes (Signed)
Physical Therapy Treatment Patient Details Name: Johnny Rodriguez MRN: [id number] DOB: May 11, 1950 Today's Date: 03/14/2019    History of Present Illness Pt is a 69 y/o male admitted secondary to SOB and fall. PMH includes CHF, COPD on 3L, a fib, CAD, HTN, and seizures.     PT Comments    Pt progressing towards physical therapy goals. Was able to perform transfers and ambulation with gross min guard assist to supervision for safety, with occasional min assist required for LOB. Pt did well with the rollator however may require increased education for safety awareness with use of brakes. Pt reports he has to walk to the grocery store which is 3 miles one way. Feel the rollator would be optimal for seated rest breaks as O2 continues to drop at times. Pt on 3L/min supplemental O2 throughout session and sats ranging from 88%-90% while mobilizing, and up to 93% at rest. Will continue to follow.    Follow Up Recommendations  Home health PT;Supervision for mobility/OOB     Equipment Recommendations  3in1 (PT);Other (comment)(4 wheeled RW with seat - pt is 5'11")    Recommendations for Other Services       Precautions / Restrictions Precautions Precautions: Fall Precaution Comments: watch oxygen sats  Restrictions Weight Bearing Restrictions: No    Mobility  Bed Mobility Overal bed mobility: Modified Independent Bed Mobility: Supine to Sit           General bed mobility comments: HOB mildly elevated (~10). No assist required for pt to transition to full standing position.   Transfers Overall transfer level: Needs assistance Equipment used: None;4-wheeled walker Transfers: Sit to/from Stand Sit to Stand: Supervision         General transfer comment: VC's for hand placement on seated surface for safety. Pt was also cued to make sure the brakes were locked on the rollator prior to initiating stand>sit.   Ambulation/Gait Ambulation/Gait assistance: Min guard;Supervision;Min  assist Gait Distance (Feet): 300 Feet Assistive device: None;4-wheeled walker Gait Pattern/deviations: Step-through pattern;Trunk flexed Gait velocity: Decreased Gait velocity interpretation: <1.8 ft/sec, indicate of risk for recurrent falls General Gait Details: Pt ambulating around room without AD and used rollator in the hallway. Noted pt appears unsteady and requires close guard for safety without an AD. VC's for improved posture and closer walker proximity with rollator. 2 LOB noted - 1 in room without UE support, and 1 in hallway after seated rest break on rollator seat. Pt was turning around to face walker again and had mild LOB, requiring min assist to recover.    Stairs             Wheelchair Mobility    Modified Rankin (Stroke Patients Only)       Balance Overall balance assessment: Needs assistance Sitting-balance support: No upper extremity supported;Feet supported Sitting balance-Leahy Scale: Fair     Standing balance support: Bilateral upper extremity supported;During functional activity Standing balance-Leahy Scale: Poor Standing balance comment: Occasional LOB without UE support.                             Cognition Arousal/Alertness: Awake/alert Behavior During Therapy: WFL for tasks assessed/performed Overall Cognitive Status: Within Functional Limits for tasks assessed                                 General Comments: Some slow processing noted at times however overall  feel he is likely at baseline      Exercises      General Comments        Pertinent Vitals/Pain Pain Assessment: Faces Faces Pain Scale: Hurts a little bit Pain Location: abdomen, bilateral anterolateral rib cage with inhalation.  Pain Descriptors / Indicators: Grimacing;Guarding Pain Intervention(s): Limited activity within patient's tolerance;Monitored during session;Repositioned    Home Living                      Prior Function             PT Goals (current goals can now be found in the care plan section) Acute Rehab PT Goals Patient Stated Goal: to decrease abdominal pain, go home.  PT Goal Formulation: With patient Time For Goal Achievement: 03/24/19 Potential to Achieve Goals: Good Progress towards PT goals: Progressing toward goals    Frequency    Min 3X/week      PT Plan Current plan remains appropriate    Co-evaluation              AM-PAC PT "6 Clicks" Mobility   Outcome Measure  Help needed turning from your back to your side while in a flat bed without using bedrails?: A Little Help needed moving from lying on your back to sitting on the side of a flat bed without using bedrails?: A Little Help needed moving to and from a bed to a chair (including a wheelchair)?: A Little Help needed standing up from a chair using your arms (e.g., wheelchair or bedside chair)?: A Little Help needed to walk in hospital room?: A Little Help needed climbing 3-5 steps with a railing? : A Lot 6 Click Score: 17    End of Session Equipment Utilized During Treatment: Gait belt;Oxygen Activity Tolerance: Patient tolerated treatment well Patient left: in chair;with call bell/phone within reach;with nursing/sitter in room Nurse Communication: Mobility status;Other (comment)(Pt requesting humidified O2) PT Visit Diagnosis: Difficulty in walking, not elsewhere classified (R26.2);Muscle weakness (generalized) (M62.81)     Time: 8127-5170 PT Time Calculation (min) (ACUTE ONLY): 34 min  Charges:  $Gait Training: 23-37 mins                     Rolinda Roan, PT, DPT Acute Rehabilitation Services Pager: [phone] Office: [place] 03/14/2019, 3:20 PM

## 2019-03-14 NOTE — Progress Notes (Signed)
Subjective: HD#4 Events Overnight:  Patient was seen this morning on rounds. Still has not had a normal volume bowel movement.   Objective:  Vital signs in last 24 hours: Vitals:   03/13/19 1200 03/13/19 1714 03/13/19 2357 03/14/19 0554  BP: 135/84 133/88 (!) 120/93 127/78  Pulse: 81 95 92 72  Resp: 18 19 18    Temp: (!) 97.5 F (36.4 C) 97.9 F (36.6 C) (!) 97.3 F (36.3 C) (!) 97.4 F (36.3 C)  TempSrc: Oral Oral Oral Oral  SpO2: 95% 95% 95% 95%  Weight:      Height:        Physical Exam: Physical Exam  Constitutional: He is oriented to person, place, and time and well-developed, well-nourished, and in no distress.  HENT:  Head: Normocephalic and atraumatic.  Eyes: EOM are normal.  Cardiovascular: Normal rate, regular rhythm, normal heart sounds and intact distal pulses. Exam reveals no gallop and no friction rub.  No murmur heard. Pulmonary/Chest: Effort normal. He has wheezes.  Abdominal: Bowel sounds are normal. He exhibits distension.  Musculoskeletal:     Cervical back: Normal range of motion.  Neurological: He is alert and oriented to person, place, and time.  Skin: Skin is warm and dry.    Filed Weights   03/10/19 0408 03/12/19 0457  Weight: 68 kg 67.9 kg     Intake/Output Summary (Last 24 hours) at 03/14/2019 0618 Last data filed at 03/13/2019 2200 Gross per 24 hour  Intake 1080 ml  Output 1000 ml  Net 80 ml    Pertinent labs/Imaging: CBC Latest Ref Rng & Units 03/14/2019 03/13/2019 03/12/2019  WBC 4.0 - 10.5 K/uL 11.3(H) 12.8(H) 16.2(H)  Hemoglobin 13.0 - 17.0 g/dL 12.8(L) 12.9(L) 13.0  Hematocrit 39.0 - 52.0 % 40.5 39.7 41.2  Platelets 150 - 400 K/uL 250 222 244    CMP Latest Ref Rng & Units 03/14/2019 03/13/2019 03/12/2019  Glucose 70 - 99 mg/dL 180(H) 170(H) 150(H)  BUN 8 - 23 mg/dL 17 20 30(H)  Creatinine 0.61 - 1.24 mg/dL 1.19 0.97 1.21  Sodium 135 - 145 mmol/L 137 137 136  Potassium 3.5 - 5.1 mmol/L 4.9 5.0 5.1  Chloride 98 - 111 mmol/L 98  98 98  CO2 22 - 32 mmol/L 31 32 30  Calcium 8.9 - 10.3 mg/dL 8.7(L) 9.0 9.1  Total Protein 6.5 - 8.1 g/dL - 6.0(L) 6.4(L)  Total Bilirubin 0.3 - 1.2 mg/dL - 0.5 0.4  Alkaline Phos 38 - 126 U/L - 56 59  AST 15 - 41 U/L - 17 20  ALT 0 - 44 U/L - 22 24    No results found.  Assessment/Plan:  Principal Problem:   COPD exacerbation ([place]) Active Problems:   AKI (acute kidney injury) ([place])   Bowel obstruction ([facility])   COPD with acute exacerbation ([place])    Patient Summary: Johnny Rodriguez is a 69 y.o. with pertinent PMH of COPD (3L supplemental oxygen), PAF on Pradaxa, HFpEF, admit for COPD exacerbation on hospital day 4  #Acute hypoxic respiratory failure #COPD exacerbation Patient is on day 5/5 of azithromycin and prednisone. On 5L supplemental O2 overnight. WBC count trending downward. Still evaluated at 11.3 predominately neutrophils likely 2/2 to steroids. - Supplemental oxygen as needed - finishing up 5th day of azithromycin and prednisone - will need pulm follow up and HH.  #Abd Pain and Distension  Patient has NG tube placed for concerns of bowel obstruction. CT negative for obstruction. Thought to be 2/2 to constipation  despite several small bowel movements yesterday. Currently on bowel regimen of colace, docusate, and Doculax.  - Increase senna to two pills - Add Miralax bid - re-evaluate in afternoon  Diet: Heart Healthy IVF: none VTE: Lovenox Code: DNR  Dispo: Anticipated discharge today or tomorrow.    Dellia Cloud, D.O. MCIMTP, PGY-1 Date 03/14/2019 Time 6:18 AM Pager: [phone]

## 2019-03-14 NOTE — TOC Progression Note (Addendum)
Transition of Care [facility]) - Progression Note    Patient Details  Name: Johnny Rodriguez MRN: [id number] Date of Birth: 25-Apr-1950  Transition of Care [facility]) CM/SW Contact  Nadene Rubins Adria Devon, RN Phone Number: 03/14/2019, 1:14 PM  Clinical Narrative:     Patient agreeable to home health PT.  Cory with [facility] unable to accept due to staffing. [facility] unable to accept due to staffing.  Rebecca Eaton with [facility]  Accepted referral for HHPT  Expected Discharge Plan: Home/Self Care    Expected Discharge Plan and Services Expected Discharge Plan: Home/Self Care       Living arrangements for the past 2 months: Hotel/Motel                                       Social Determinants of Health (SDOH) Interventions    Readmission Risk Interventions No flowsheet data found.

## 2019-03-15 DIAGNOSIS — K56600 Partial intestinal obstruction, unspecified as to cause: Secondary | ICD-10-CM | POA: Diagnosis not present

## 2019-03-15 DIAGNOSIS — J9621 Acute and chronic respiratory failure with hypoxia: Secondary | ICD-10-CM | POA: Diagnosis not present

## 2019-03-15 DIAGNOSIS — K5909 Other constipation: Secondary | ICD-10-CM

## 2019-03-15 DIAGNOSIS — J441 Chronic obstructive pulmonary disease with (acute) exacerbation: Secondary | ICD-10-CM | POA: Diagnosis not present

## 2019-03-15 DIAGNOSIS — E875 Hyperkalemia: Secondary | ICD-10-CM | POA: Diagnosis not present

## 2019-03-15 LAB — BASIC METABOLIC PANEL
Anion gap: 10 (ref 5–15)
BUN: 21 mg/dL (ref 8–23)
CO2: 32 mmol/L (ref 22–32)
Calcium: 8.9 mg/dL (ref 8.9–10.3)
Chloride: 95 mmol/L — ABNORMAL LOW (ref 98–111)
Creatinine, Ser: 1.11 mg/dL (ref 0.61–1.24)
GFR calc Af Amer: >60 mL/min (ref >60)
GFR calc non Af Amer: >60 mL/min (ref >60)
Glucose, Bld: 99 mg/dL (ref 70–99)
Potassium: 5.2 mmol/L — ABNORMAL HIGH (ref 3.5–5.1)
Sodium: 137 mmol/L (ref 135–145)

## 2019-03-15 LAB — CBC
HCT: 43.6 % (ref 39.0–52.0)
Hemoglobin: 13.8 g/dL (ref 13.0–17.0)
MCH: 30.1 pg (ref 26.0–34.0)
MCHC: 31.7 g/dL (ref 30.0–36.0)
MCV: 95.2 fL (ref 80.0–100.0)
Platelets: 280 10*3/uL (ref 150–400)
RBC: 4.58 MIL/uL (ref 4.22–5.81)
RDW: 13.9 % (ref 11.5–15.5)
WBC: 9.8 10*3/uL (ref 4.0–10.5)
nRBC: 0 % (ref 0.0–0.2)

## 2019-03-15 MED ORDER — SODIUM ZIRCONIUM CYCLOSILICATE 5 G PO PACK
5.0000 g | PACK | Freq: Once | ORAL | Status: AC
  Administered 2019-03-15: 5 g via ORAL
  Filled 2019-03-15: qty 1

## 2019-03-15 MED ORDER — POLYETHYLENE GLYCOL 3350 17 G PO PACK: 17.0000 g | PACK | Freq: Every day | ORAL | 0 refills | Status: AC

## 2019-03-15 MED ORDER — SENNA 8.6 MG PO TABS: 1.0000 | ORAL_TABLET | Freq: Every day | ORAL | 0 refills | Status: AC

## 2019-03-15 MED FILL — POLYETHYLENE GLYCOL 3350 PO: 17 | 30 days supply | Qty: 510 | Fill #0

## 2019-03-15 MED FILL — SENNA 8.6 MG TABS: 8.6 | 15 days supply | Qty: 30 | Fill #0

## 2019-03-15 NOTE — TOC Transition Note (Signed)
Transition of Care [facility]) - CM/SW Discharge Note   Patient Details  Name: Johnny Rodriguez MRN: [id number] Date of Birth: 1950-05-01  Transition of Care [facility]) CM/SW Contact:  Kingsley Plan, RN Phone Number: 03/15/2019, 10:46 AM   Clinical Narrative:     Patient from hotel has home oxygen and CPAP through [employer]. Has portable tank with him. Provided cab voucher.   Patient has had [facility] transport in past and has Medicaid. Provided applications for patient to complete and submit.   Changed pharmacy to [facility]   Final next level of care: Home w Home Health Services Barriers to Discharge: No Barriers Identified   Patient Goals and CMS Choice Patient states their goals for this hospitalization and ongoing recovery are:: to return to home CMS Medicare.gov Compare Post Acute Care list provided to:: Patient Choice offered to / list presented to : Patient  Discharge Placement                       Discharge Plan and Services   Discharge Planning Services: CM Consult Post Acute Care Choice: Home Health          DME Arranged: N/A         HH Arranged: PT HH Agency: [facility]now [facility]) Date HH Agency Contacted: 03/14/19 Time HH Agency Contacted: 1045 Representative spoke with at [facility] Agency: Rebecca Eaton  Social Determinants of Health (SDOH) Interventions     Readmission Risk Interventions No flowsheet data found.

## 2019-03-15 NOTE — Progress Notes (Signed)
Patient educated on discharge papers.Medication delivered by pharmacy. Patient taken to taxi by wheelchair.

## 2019-03-15 NOTE — Progress Notes (Signed)
   Subjective: HD#4 Events Overnight: patient had 2 BM's overnight.  Patient was seen this morning on rounds. Tolerating food well and abdominal pain has improved. Patient states that he feels better. His breathing has improved.    Objective:  Vital signs in last 24 hours: Vitals:   03/14/19 1730 03/14/19 2211 03/14/19 2333 03/15/19 0515  BP: 112/78  125/86 113/79  Pulse: 78  78 66  Resp: 18  18 18   Temp: (!) 97.3 F (36.3 C)  97.7 F (36.5 C) (!) 97.4 F (36.3 C)  TempSrc: Oral  Oral Oral  SpO2: 94% 92% 94% 95%  Weight:    81.4 kg  Height:        Physical Exam: Physical Exam  Filed Weights   03/10/19 0408 03/12/19 0457 03/15/19 0515  Weight: 68 kg 67.9 kg 81.4 kg     Intake/Output Summary (Last 24 hours) at 03/15/2019 0631 Last data filed at 03/14/2019 2100 Gross per 24 hour  Intake 240 ml  Output 675 ml  Net -435 ml    Pertinent labs/Imaging: CBC Latest Ref Rng & Units 03/15/2019 03/14/2019 03/13/2019  WBC 4.0 - 10.5 K/uL 9.8 11.3(H) 12.8(H)  Hemoglobin 13.0 - 17.0 g/dL 03/15/2019 12.8(L) 12.9(L)  Hematocrit 39.0 - 52.0 % 43.6 40.5 39.7  Platelets 150 - 400 K/uL 280 250 222    CMP Latest Ref Rng & Units 03/15/2019 03/14/2019 03/13/2019  Glucose 70 - 99 mg/dL 99 03/15/2019) 382(N)  BUN 8 - 23 mg/dL 21 17 20   Creatinine 0.61 - 1.24 mg/dL 053(Z 7.67  Sodium 135 - 145 mmol/L 137 137 137  Potassium 3.5 - 5.1 mmol/L 5.2(H) 4.9 5.0  Chloride 98 - 111 mmol/L 95(L) 98 98  CO2 22 - 32 mmol/L 32 31 32  Calcium 8.9 - 10.3 mg/dL 8.9 3.41) 9.0  Total Protein 6.5 - 8.1 g/dL - - 6.0(L)  Total Bilirubin 0.3 - 1.2 mg/dL - - 0.5  Alkaline Phos 38 - 126 U/L - - 56  AST 15 - 41 U/L - - 17  ALT 0 - 44 U/L - - 22    No results found.  Assessment/Plan:  Principal Problem:   COPD exacerbation ([facility]) Active Problems:   AKI (acute kidney injury) (HCC)   Bowel obstruction (HCC)   COPD with acute exacerbation (HCC)    Patient Summary: HALDON CARLEY is a 69 y.o. with pertinent PMH of  COPD (3L supplemental O2), PAF (Pradaxa), HFpEF, admit for COPD exacerbation on hospital day 4  #Acute hypoxic respiratory failure #COPD exacerbation  Patient has finished a 5 day course of azithromycin and prednisone and has clinically improved. He is back on home rate of supplemental oxygen of 3 liters without respiratory distress or desaturation. - Continue Duoneb q 4 hours prn and dulera 2 puffs bid - Patient is stable for discharge with follow up with his PCP and pulmonologist.  #Constiaption: Patients constipation has resolved . He had 5 bowel movements overnight and admits to resolution of his abdominal pain.  - We will discharge him with a bowel regimen of senna and miralax daily. - I counseled him on the importance of having a solid/soft BM every day without straining for anal/rectal health.  #Hyperkalemia: Patient had a mild elevation in potassium of 5.2  - Patient given a single dose of Lokelma 5 g.  Diet: heart health IVF: none VTE: enoxaparin Code: full  Dispo: Anticipated discharge today.   Elias Else, D.O. MCIMTP, PGY-1 Date 03/15/2019 Time 6:31 AM

## 2019-03-15 NOTE — Progress Notes (Signed)
Per patient, he had had a total of 4x BM throughout the night. Stated feeling better.

## 2019-03-15 NOTE — Plan of Care (Signed)

## 2019-03-16 NOTE — Discharge Summary (Addendum)
Name: Johnny Rodriguez MRN: [id number] DOB: 1951/01/21 69 y.o. PCP: Gwenyth Bender, MD  Date of Admission: 03/10/2019  3:53 AM Date of Discharge: 03/15/2019 Attending Physician: Dr. Erlinda Hong  Discharge Diagnosis: 1. Acute on chronic respiratory failure 2/2 to COPD exacerbation  2. Partial small bowel obstruction 3. Chronic constipation  Discharge Medications: Allergies as of 03/15/2019   No Known Allergies      Medication List     STOP taking these medications    spironolactone 25 MG tablet Commonly known as: ALDACTONE       TAKE these medications    albuterol 108 (90 Base) MCG/ACT inhaler Commonly known as: ProAir HFA Inhale 1-2 puffs into the lungs every 6 (six) hours as needed for wheezing or shortness of breath.   amiodarone 200 MG tablet Commonly known as: PACERONE Take 200 mg by mouth every morning.   dabigatran 150 MG Caps capsule Commonly known as: PRADAXA Take 1 capsule (150 mg total) by mouth 2 (two) times daily. Medicaid [id number] O   diltiazem 240 MG 24 hr capsule Commonly known as: CARDIZEM CD Take 1 capsule (240 mg total) by mouth daily. Medicaid [id number] O   folic acid 1 MG tablet Commonly known as: FOLVITE Take 1 tablet (1 mg total) by mouth daily.   furosemide 40 MG tablet Commonly known as: Lasix Take 1 tablet (40 mg total) by mouth daily. Medicaid [id number] O   losartan 25 MG tablet Commonly known as: Cozaar Take 1 tablet (25 mg total) by mouth daily. Medicaid [id number] O   metoprolol succinate 50 MG 24 hr tablet Commonly known as: TOPROL-XL Take 1 tablet (50 mg total) by mouth daily. Take with or immediately following a meal. Medicaid [id number] O   mometasone-formoterol 200-5 MCG/ACT Aero Commonly known as: DULERA Inhale 2 puffs into the lungs 2 (two) times daily. Medicaid [id number] O   multivitamin with minerals Tabs tablet Take 1 tablet by mouth daily.   OXYGEN 3lpm 24/7  A[facility]   polyethylene glycol 17 g packet Commonly known  as: MIRALAX / GLYCOLAX Take 17 g by mouth daily.   senna 8.6 MG Tabs tablet Commonly known as: SENOKOT Take 1-2 tablets (8.6-17.2 mg total) by mouth daily.   sertraline 100 MG tablet Commonly known as: ZOLOFT Take 100 mg by mouth daily.   thiamine 100 MG tablet Take 1 tablet (100 mg total) by mouth daily.   tiotropium 18 MCG inhalation capsule Commonly known as: SPIRIVA Place 1 capsule (18 mcg total) into inhaler and inhale daily.   Vitamin D2 50 MCG (2000 UT) Tabs Take 1 tablet by mouth daily.        Disposition and follow-up:   Johnny Rodriguez was discharged from [facility] in Stable condition.  At the hospital follow up visit please address:  1.  Follow up:  A) COPD - make sure patient has a follow up with his pulmonologist.  B) Hyperkalemia - mild hyperkalemia on discharge, given a single dose of Lokelma - make sure repeat BMP K is WNL C) Constipation - started patient on bowel regimen of senna and miralax - make sure his is tolerating this   2.  Labs / imaging needed at time of follow-up: BMP  3.  Pending labs/ test needing follow-up: none  Follow-up Appointments: Follow-up Information     [facility]   Specialty: Home Health Services Why: Home health PT  Contact information: [address] [place] [address] [phone]  Hospital Course by problem list: 1. COPD Exacerbation - patient presented with a 1 week history of worsening dyspnea with increased oxygen requirement of 5 L and increased sputum production. CXR showed hyperinflated lungs without opacities or consolidations. Patient was given a 5 day course of steroids and Azithromycin with improvement of his symptoms. Patient was discharged on his home COPD regimen.   2. Constipation - Patient presented with abdominal pain, distention, nausea, and constipation concerning for bowel obstruction. CT scan negative for obstruction. Bowel regimen  initiated and patient was able to have several bowel movements, which resolved his GI symptoms. Discharged on bowel regimen of Senna and miralax.   Discharge Vitals:   BP 113/79 (BP Location: Left Arm)   Pulse 66   Temp (!) 97.4 F (36.3 C) (Oral)   Resp 18   Ht 5\' 11"  (1.803 m)   Wt 81.4 kg   SpO2 96%   BMI 25.02 kg/m   Pertinent Labs, Studies, and Procedures:  CBC Latest Ref Rng & Units 03/15/2019 03/14/2019 03/13/2019  WBC 4.0 - 10.5 K/uL 9.8 11.3(H) 12.8(H)  Hemoglobin 13.0 - 17.0 g/dL 03/15/2019 12.8(L) 12.9(L)  Hematocrit 39.0 - 52.0 % 43.6 40.5 39.7  Platelets 150 - 400 K/uL 280 250 222   CMP Latest Ref Rng & Units 03/15/2019 03/14/2019 03/13/2019  Glucose 70 - 99 mg/dL 99 03/15/2019) 092(Z)  BUN 8 - 23 mg/dL 21 17 20   Creatinine 0.61 - 1.24 mg/dL 300(T 6.22  Sodium 135 - 145 mmol/L 137 137 137  Potassium 3.5 - 5.1 mmol/L 5.2(H) 4.9 5.0  Chloride 98 - 111 mmol/L 95(L) 98 98  CO2 22 - 32 mmol/L 32 31 32  Calcium 8.9 - 10.3 mg/dL 8.9 6.33) 9.0  Total Protein 6.5 - 8.1 g/dL - - 6.0(L)  Total Bilirubin 0.3 - 1.2 mg/dL - - 0.5  Alkaline Phos 38 - 126 U/L - - 56  AST 15 - 41 U/L - - 17  ALT 0 - 44 U/L - - 22   CXR - No acute cardiopulmonary abnormality.  CT abdomen - No evidence of bowel obstruction or other acute findings.    Discharge Instructions: Discharge Instructions     Diet - low sodium heart healthy   Complete by: As directed    Discharge instructions   Complete by: As directed    You were hospitalized for COPD exacerbation and constipation. Thank you for allowing [facility] to be part of your care.   We arranged for you to follow up at:   - Please follow up with your primary care provider within 2 weeks for a hospital follow up appointment.    Please note these changes made to your medications:   Please START taking:  - Miralax 17 g pack or 1 cap full daily - Senna 8.6 mg, take 1-2 tablets daily   Please STOP taking: none   Please make sure to goal is to have one  solid/soft bowel movement daily.  Please call our clinic if you have any questions or concerns, we may be able to help and keep you from a long and expensive emergency room wait. Our clinic and after hours phone number is [phone], the best time to call is Monday through Friday 9 am to 4 pm but there is always someone available 24/7 if you have an emergency. If you need medication refills please notify your pharmacy one week in advance and they will send Monday a request.   Increase activity slowly  Complete by: As directed        Signed: Marianna Payment, MD 03/16/2019, 5:42 AM   Pager: [phone]

## 2019-03-25 ENCOUNTER — Encounter (HOSPITAL_COMMUNITY): Payer: Self-pay

## 2019-03-25 ENCOUNTER — Emergency Department (HOSPITAL_BASED_OUTPATIENT_CLINIC_OR_DEPARTMENT_OTHER): Payer: Medicare Other

## 2019-03-25 ENCOUNTER — Other Ambulatory Visit: Payer: Self-pay

## 2019-03-25 ENCOUNTER — Emergency Department (HOSPITAL_COMMUNITY)
Admission: EM | Admit: 2019-03-25 | Discharge: 2019-03-25 | Disposition: A | Discharge status: Home / Self Care | Payer: Medicare Other | Attending: Emergency Medicine | Admitting: Emergency Medicine

## 2019-03-25 ENCOUNTER — Emergency Department (HOSPITAL_COMMUNITY): Payer: Medicare Other

## 2019-03-25 DIAGNOSIS — M7989 Other specified soft tissue disorders: Secondary | ICD-10-CM

## 2019-03-25 DIAGNOSIS — R600 Localized edema: Secondary | ICD-10-CM | POA: Insufficient documentation

## 2019-03-25 DIAGNOSIS — F17200 Nicotine dependence, unspecified, uncomplicated: Secondary | ICD-10-CM | POA: Insufficient documentation

## 2019-03-25 DIAGNOSIS — J449 Chronic obstructive pulmonary disease, unspecified: Secondary | ICD-10-CM | POA: Insufficient documentation

## 2019-03-25 DIAGNOSIS — R0602 Shortness of breath: Secondary | ICD-10-CM | POA: Diagnosis not present

## 2019-03-25 DIAGNOSIS — I2510 Atherosclerotic heart disease of native coronary artery without angina pectoris: Secondary | ICD-10-CM | POA: Diagnosis not present

## 2019-03-25 DIAGNOSIS — Z79899 Other long term (current) drug therapy: Secondary | ICD-10-CM | POA: Diagnosis not present

## 2019-03-25 DIAGNOSIS — I10 Essential (primary) hypertension: Secondary | ICD-10-CM | POA: Diagnosis not present

## 2019-03-25 DIAGNOSIS — Z7901 Long term (current) use of anticoagulants: Secondary | ICD-10-CM | POA: Insufficient documentation

## 2019-03-25 LAB — COMPREHENSIVE METABOLIC PANEL
ALT: 25 U/L (ref 0–44)
AST: 17 U/L (ref 15–41)
Albumin: 3.2 g/dL — ABNORMAL LOW (ref 3.5–5.0)
Alkaline Phosphatase: 55 U/L (ref 38–126)
Anion gap: 9 (ref 5–15)
BUN: 11 mg/dL (ref 8–23)
CO2: 30 mmol/L (ref 22–32)
Calcium: 8.3 mg/dL — ABNORMAL LOW (ref 8.9–10.3)
Chloride: 101 mmol/L (ref 98–111)
Creatinine, Ser: 0.94 mg/dL (ref 0.61–1.24)
GFR calc Af Amer: >60 mL/min (ref >60)
GFR calc non Af Amer: >60 mL/min (ref >60)
Glucose, Bld: 97 mg/dL (ref 70–99)
Potassium: 4 mmol/L (ref 3.5–5.1)
Sodium: 140 mmol/L (ref 135–145)
Total Bilirubin: 1 mg/dL (ref 0.3–1.2)
Total Protein: 5.9 g/dL — ABNORMAL LOW (ref 6.5–8.1)

## 2019-03-25 LAB — CBC WITH DIFFERENTIAL/PLATELET
Abs Immature Granulocytes: 0.04 10*3/uL (ref 0.00–0.07)
Basophils Absolute: 0 10*3/uL (ref 0.0–0.1)
Basophils Relative: 0 %
Eosinophils Absolute: 0.1 10*3/uL (ref 0.0–0.5)
Eosinophils Relative: 1 %
HCT: 39.3 % (ref 39.0–52.0)
Hemoglobin: 13 g/dL (ref 13.0–17.0)
Immature Granulocytes: 0 %
Lymphocytes Relative: 10 %
Lymphs Abs: 0.9 10*3/uL (ref 0.7–4.0)
MCH: 31.1 pg (ref 26.0–34.0)
MCHC: 33.1 g/dL (ref 30.0–36.0)
MCV: 94 fL (ref 80.0–100.0)
Monocytes Absolute: 0.7 10*3/uL (ref 0.1–1.0)
Monocytes Relative: 8 %
Neutro Abs: 7.2 10*3/uL (ref 1.7–7.7)
Neutrophils Relative %: 81 %
Platelets: 202 10*3/uL (ref 150–400)
RBC: 4.18 MIL/uL — ABNORMAL LOW (ref 4.22–5.81)
RDW: 14 % (ref 11.5–15.5)
WBC: 9 10*3/uL (ref 4.0–10.5)
nRBC: 0 % (ref 0.0–0.2)

## 2019-03-25 LAB — BRAIN NATRIURETIC PEPTIDE: B Natriuretic Peptide: 66.6 pg/mL (ref 0.0–100.0)

## 2019-03-25 MED ORDER — FUROSEMIDE 10 MG/ML IJ SOLN
40.0000 mg | Freq: Once | INTRAMUSCULAR | Status: AC
  Administered 2019-03-25: 40 mg via INTRAVENOUS
  Filled 2019-03-25: qty 4

## 2019-03-25 MED ORDER — ALBUTEROL SULFATE HFA 108 (90 BASE) MCG/ACT IN AERS
2.0000 | INHALATION_SPRAY | Freq: Once | RESPIRATORY_TRACT | Status: AC
  Administered 2019-03-25: 16:00:00 2 via RESPIRATORY_TRACT
  Filled 2019-03-25: qty 6.7

## 2019-03-25 NOTE — Discharge Instructions (Addendum)
[name], Johnny Rodriguez were seen in the ER today for leg swelling and stomach swelling. Your blood work was re-assuring today.   Starting tomorrow morning (Saturday):  Take Lasix 40 mg tablet twice a day  On Tuesday:  Return to taking Lasix only once per day  In addition, always keep your Albuterol inhaler close by.   These problems that have been on-going and will require outpatient follow up, as we discussed. It will be important to follow up with your primary care doctor, so she can refer you to another heart doctor. Also, follow up with your lung doctor soon too.   If you develop:  - Worsening difficulty breathing - Chest pain - Dizziness  Please call return to the ER

## 2019-03-25 NOTE — ED Notes (Signed)
Called [facility] for pt transport  

## 2019-03-25 NOTE — ED Notes (Signed)
Called to cancel [facility] transport

## 2019-03-25 NOTE — Progress Notes (Signed)
Left lower extremity venous duplex exam performed.  Preliminary results can be found under CV proc under chart review.  03/25/2019 3:07 PM  Bob Eastwood, K., RDMS, RVT

## 2019-03-25 NOTE — ED Provider Notes (Addendum)
[facility] EMERGENCY DEPARTMENT Provider Note   CSN: [id number] Arrival date & time: 03/25/19  1221    History Chief Complaint  Patient presents with  . Leg Swelling    Johnny Rodriguez is a 69 y.o. male with a PMHx of Afib on Pradexa, COPD on 3L home O2, HTN, CAD, who presents to the ED with c/o leg and abdominal swelling.   Johnny Rodriguez states that he has been having worsening shortness of breath and abdominal distention since being discharged from the hospital.  He states that the shortness of breath is exacerbated by any exertion and he has given take breaks and walking from room to room.  He finds this extremely frustrating.  He continues to use 3 L without needing to increase and is compliant with all his inhalers.  He notes his abdominal distention has mildly increased, he continues to have bowel movements that are mostly loose.  He notes increased leg swelling, particularly around his ankles that developed in the past couple days.  He has a history of heart failure on 40 mg of Lasix daily, and has been taking this as instructed.  Johnny Rodriguez notes diaphoresis with any exertion and at night.  He denies any chills, nausea, vomiting, changes to his chronic cough, focal weakness.  Past Medical History:  Diagnosis Date  . AF (atrial fibrillation) (HCC)   . Alcohol abuse   . Angina pectoris   . CAD (coronary artery disease) 12/15/2004  . COPD (chronic obstructive pulmonary disease) (HCC)   . Coronary artery disease   . Heavy cigarette smoker   . HTN (hypertension)   . Idiopathic cardiomyopathy (HCC) 07/16/2009  . Non compliance with medical treatment   . Seizures [facility])    Patient Active Problem List   Diagnosis Date Noted  . COPD with acute exacerbation (HCC) 03/11/2019  . AKI (acute kidney injury) (HCC) 03/10/2019  . Bowel obstruction (HCC) 03/10/2019  . Chronic respiratory failure with hypoxia (HCC) 10/15/2016  . Cigarette smoker 09/09/2016  . Long term current use  of anticoagulant therapy 06/24/2016  . History of syncope 06/24/2016  . COPD exacerbation (HCC) 02/08/2015  . Essential hypertension 02/08/2015  . Syncope 12/10/2011  . History of noncompliance with medical treatment 12/01/2011  . Chronic systolic heart failure (HCC) 04/10/2011  . Idiopathic cardiomyopathy (HCC) 07/16/2009  . Paroxysmal atrial fibrillation (HCC) 07/16/2009  . COPD PFT's pending  07/16/2009  . CAD (coronary artery disease) 12/15/2004    Past Surgical History:  Procedure Laterality Date  . CARDIAC CATHETERIZATION  2006  . CARDIAC CATHETERIZATION  12/2004   Left   . CARDIOVERSION  01/21/2011   Procedure: CARDIOVERSION;  Surgeon: Johnny Rodriguez., MD;  Location: [facility] OR;  Service: Cardiovascular;  Laterality: N/A;  . DOPPLER ECHOCARDIOGRAPHY  02/2011  . ELECTROPHYSIOLOGY STUDY N/A 12/16/2011   Procedure: ELECTROPHYSIOLOGY STUDY;  Surgeon: Johnny Maw, MD;  Location: [facility] CATH LAB;  Service: Cardiovascular;  Laterality: N/A;  . EP IMPLANTABLE DEVICE N/A 08/24/2015   Procedure: Loop Recorder Removal;  Surgeon: Johnny Maw, MD;  Location: [facility] INVASIVE CV LAB;  Service: Cardiovascular;  Laterality: N/A;  . fx collar bone    . NO PAST SURGERIES         Family History  Problem Relation Age of Onset  . Suicidality Sister   . CVA Mother   . Stroke Mother   . Heart disease Father   . Coronary artery disease Father   . COPD Father  Social History   Tobacco Use  . Smoking status: Current Every Day Smoker    Packs/day: 1.00    Years: 58.00    Pack years: 58.00  . Smokeless tobacco: Never Used  . Tobacco comment: pt states he is down to 1ppd  Substance Use Topics  . Alcohol use: Yes    Alcohol/week: 3.0 standard drinks    Types: 3 Cans of beer per week    Comment: EtOH use diorder - liquor  . Drug use: No    Home Medications Prior to Admission medications   Medication Sig Start Date End Date Taking? Authorizing Provider  albuterol (PROAIR HFA) 108 (90  Base) MCG/ACT inhaler Inhale 1-2 puffs into the lungs every 6 (six) hours as needed for wheezing or shortness of breath. 02/09/19   Johnny Belling, MD  amiodarone (PACERONE) 200 MG tablet Take 200 mg by mouth every morning. 01/26/19   [provider]  dabigatran (PRADAXA) 150 MG CAPS capsule Take 1 capsule (150 mg total) by mouth 2 (two) times daily. Medicaid [id number] Johnny Rodriguez 02/09/19   Johnny Belling, MD  diltiazem (CARDIZEM CD) 240 MG 24 hr capsule Take 1 capsule (240 mg total) by mouth daily. Medicaid [id number] O 02/09/19 03/11/19  Johnny Belling, MD  Ergocalciferol (VITAMIN D2) 2000 units TABS Take 1 tablet by mouth daily.    [provider]  folic acid (FOLVITE) 1 MG tablet Take 1 tablet (1 mg total) by mouth daily. 04/04/17   Hongalgi, Lenis Dickinson, MD  furosemide (LASIX) 40 MG tablet Take 1 tablet (40 mg total) by mouth daily. Medicaid [id number] O 04/03/17 05/03/17  Hongalgi, Lenis Dickinson, MD  losartan (COZAAR) 25 MG tablet Take 1 tablet (25 mg total) by mouth daily. Medicaid [id number] O 02/09/19 03/11/19  Johnny Belling, MD  metoprolol succinate (TOPROL-XL) 50 MG 24 hr tablet Take 1 tablet (50 mg total) by mouth daily. Take with or immediately following a meal. Medicaid [id number] O 02/09/19   Johnny Belling, MD  mometasone-formoterol [facility]) 200-5 MCG/ACT AERO Inhale 2 puffs into the lungs 2 (two) times daily. Medicaid [id number] Johnny Rodriguez 02/09/19   Johnny Belling, MD  Multiple Vitamin (MULTIVITAMIN WITH MINERALS) TABS tablet Take 1 tablet by mouth daily. 04/04/17   Hongalgi, Lenis Dickinson, MD  OXYGEN 3lpm 24/7  [facility]    [provider]  polyethylene glycol (MIRALAX / GLYCOLAX) 17 g packet Take 17 g by mouth daily. 03/15/19   Johnny Payment, MD  senna (SENOKOT) 8.6 MG TABS tablet Take 1-2 tablets (8.6-17.2 mg total) by mouth daily. 03/15/19   Johnny Payment, MD  sertraline (ZOLOFT) 100 MG tablet Take 100 mg by mouth daily.    [provider]  thiamine 100 MG tablet Take 1 tablet (100 mg  total) by mouth daily. 04/04/17   Hongalgi, Lenis Dickinson, MD  tiotropium (SPIRIVA) 18 MCG inhalation capsule Place 1 capsule (18 mcg total) into inhaler and inhale daily. 02/09/19   Johnny Belling, MD    Allergies    Patient has no known allergies.  Review of Systems   Review of Systems  Constitutional: Positive for diaphoresis. Negative for chills.  Respiratory: Positive for cough, shortness of breath and wheezing.   Cardiovascular: Positive for leg swelling. Negative for chest pain and palpitations.  Gastrointestinal: Positive for abdominal distention, abdominal pain, constipation and diarrhea. Negative for nausea and vomiting.  Genitourinary: Positive for frequency. Negative for difficulty urinating and dysuria.  Musculoskeletal: Negative for arthralgias and myalgias.  Skin: Negative for rash and wound.  Neurological: Negative for dizziness and weakness.  Physical Exam Updated Vital Signs BP 122/80   Pulse (!) 54   Temp 98.2 F (36.8 C) (Oral)   Resp (!) 27   Ht 5\' 11"  (1.803 m)   Wt 68 kg   SpO2 93%   BMI 20.92 kg/m   Physical Exam Vitals and nursing note reviewed.  Constitutional:      General: He is not in acute distress.    Appearance: He is normal weight.  Cardiovascular:     Rate and Rhythm: Normal rate. Rhythm irregular.     Heart sounds: No murmur.  Pulmonary:     Effort: Pulmonary effort is normal. No respiratory distress.     Breath sounds: Wheezing (diffuse, expiratory wheezing) present.  Abdominal:     General: Bowel sounds are decreased. There is distension.     Tenderness: There is generalized abdominal tenderness. There is guarding.     Hernia: No hernia is present.  Musculoskeletal:        General: No deformity or signs of injury.     Right lower leg: Edema (2+ pitting bilaterally) present.     Left lower leg: Edema present.  Skin:    General: Skin is warm and dry.  Neurological:     General: No focal deficit present.     Mental Status: He is  alert and oriented to person, place, and time. Mental status is at baseline.  Psychiatric:        Mood and Affect: Mood normal.        Behavior: Behavior normal.    ED Results / Procedures / Treatments   Labs (all labs ordered are listed, but only abnormal results are displayed) Labs Reviewed  COMPREHENSIVE METABOLIC PANEL - Abnormal; Notable for the following components:      Result Value   Calcium 8.3 (*)    Total Protein 5.9 (*)    Albumin 3.2 (*)    All other components within normal limits  CBC WITH DIFFERENTIAL/PLATELET - Abnormal; Notable for the following components:   RBC 4.18 (*)    All other components within normal limits  BRAIN NATRIURETIC PEPTIDE   EKG EKG Interpretation  Date/Time:  Friday March 25 2019 13:05:32 EST Ventricular Rate:  51 PR Interval:    QRS Duration: 96 QT Interval:  635 QTC Calculation: 544 R Axis:   -79 Text Interpretation: Atrial fibrillation Ventricular premature complex Left anterior fascicular block Lateral infarct, acute (LAD) Prolonged QT interval Baseline wander in lead(s) V2 V6 Poor data quality Confirmed by Tilden Fossa [phone]) on 03/25/2019 1:19:35 PM  Radiology DG Chest Port 1 View  Result Date: 03/25/2019 CLINICAL DATA:  Shortness of breath and edema.  Weakness. EXAM: PORTABLE CHEST 1 VIEW COMPARISON:  March 10, 2019 FINDINGS: The heart size and mediastinal contours are within normal limits. Both lungs are clear. The visualized skeletal structures are unremarkable. IMPRESSION: No active disease. Electronically Signed   By: Gerome Sam III M.D   On: 03/25/2019 13:56   VAS [place] LOWER EXTREMITY VENOUS (DVT) (ONLY [facility] & [facility])  Result Date: 03/25/2019  Lower Venous DVTStudy Indications: Swelling.  Comparison Study: No prior exam. Performing Technologist: Kennedy Bucker ARDMS, RVT  Examination Guidelines: A complete evaluation includes B-mode imaging, spectral Doppler, color Doppler, and power Doppler as needed of all accessible  portions of each vessel. Bilateral testing is considered an integral part of a complete examination. Limited examinations for reoccurring indications may be performed as noted. The reflux portion of the exam is performed with the patient  in reverse Trendelenburg.  +-----+---------------+---------+-----------+----------+--------------+ RIGHTCompressibilityPhasicitySpontaneityPropertiesThrombus Aging +-----+---------------+---------+-----------+----------+--------------+ CFV  Full           Yes      Yes                                 +-----+---------------+---------+-----------+----------+--------------+   +---------+---------------+---------+-----------+----------+--------------+ LEFT     CompressibilityPhasicitySpontaneityPropertiesThrombus Aging +---------+---------------+---------+-----------+----------+--------------+ CFV      Full           Yes      Yes                                 +---------+---------------+---------+-----------+----------+--------------+ SFJ      Full                                                        +---------+---------------+---------+-----------+----------+--------------+ FV Prox  Full                                                        +---------+---------------+---------+-----------+----------+--------------+ FV Mid   Full                                                        +---------+---------------+---------+-----------+----------+--------------+ FV DistalFull                                                        +---------+---------------+---------+-----------+----------+--------------+ PFV      Full                                                        +---------+---------------+---------+-----------+----------+--------------+ POP      Full           Yes      Yes                                 +---------+---------------+---------+-----------+----------+--------------+ PTV      Full                                                         +---------+---------------+---------+-----------+----------+--------------+ PERO     Full                                                        +---------+---------------+---------+-----------+----------+--------------+  Summary: RIGHT: - No evidence of common femoral vein obstruction.  LEFT: - There is no evidence of deep vein thrombosis in the lower extremity.  - No cystic structure found in the popliteal fossa.  *See table(s) above for measurements and observations.    Preliminary     Procedures Procedures (including critical care time)  Medications Ordered in ED Medications  albuterol (VENTOLIN HFA) 108 (90 Base) MCG/ACT inhaler 2 puff (2 puffs Inhalation Given 03/25/19 1611)  furosemide (LASIX) injection 40 mg (40 mg Intravenous Given 03/25/19 1611)    ED Course  I have reviewed the triage vital signs and the nursing notes.  Pertinent labs & imaging results that were available during my care of the patient were reviewed by me and considered in my medical decision making (see chart for details).    MDM Rules/Calculators/A&P                      Mr. Stroble is a 69 year old gentleman with a past medical history COPD on 3 L home oxygen, heart failure, CAD, hypertension who presents to the ED with complaints of worsening shortness of breath and abdominal distention.  Patient was recently seen at [facility] by the IMT[facility] for COPD exacerbation complicated by abdominal distention felt to be secondary to constipation.  Initially distention was concerning for a bowel obstruction but ruled out with CT.  Today on examination he is saturating well on his home oxygen level but does have diffuse wheezes bilaterally.  His shortness of breath may be secondary to advancing COPD versus acute exacerbation of CHF.  CHF exacerbation would be consistent with his lower extremity swelling, however BNP today is 68.  No past echo can be found per chart review.  Will  initiate treatment with DuoNeb and 40 mg of IV Lasix.  CMP is largely unremarkable, as is the CBC.  No evidence of anemia, acute liver injury, AKI or electrolyte abnormalities. Patient is remaining hemodynamically stable.  He is in A. fib chronically but rate controlled at this time. I discussed with patient the importance of close follow up with cardiology, pulmonologist and PCP. No acute findings at this time to indicate need for admission. Will plan to discharge with instructions to increase lasix dose to BID for 3 days.   Final Clinical Impression(s) / ED Diagnoses Final diagnoses:  Leg swelling   Rx / DC Orders ED Discharge Orders    None       Verdene Lennert, MD 03/25/19 1641    Verdene Lennert, MD 03/25/19 1641    Tilden Fossa, MD 03/26/19 1054

## 2019-03-25 NOTE — ED Triage Notes (Signed)
Patient transported by EMS states he has had increased swelling in his abdomen and legs x 6 months.

## 2019-04-08 ENCOUNTER — Telehealth (INDEPENDENT_AMBULATORY_CARE_PROVIDER_SITE_OTHER): Payer: Medicare Other | Admitting: Internal Medicine

## 2019-04-08 ENCOUNTER — Encounter: Payer: Self-pay | Admitting: Internal Medicine

## 2019-04-08 ENCOUNTER — Other Ambulatory Visit: Payer: Self-pay

## 2019-04-08 VITALS — Ht 71.0 in | Wt 150.0 lb

## 2019-04-08 DIAGNOSIS — I10 Essential (primary) hypertension: Secondary | ICD-10-CM | POA: Diagnosis not present

## 2019-04-08 DIAGNOSIS — R0600 Dyspnea, unspecified: Secondary | ICD-10-CM

## 2019-04-08 DIAGNOSIS — I4819 Other persistent atrial fibrillation: Secondary | ICD-10-CM | POA: Diagnosis not present

## 2019-04-08 NOTE — Patient Instructions (Addendum)
Medication Instructions:  Your physician has recommended you make the following change in your medication:   STOP AMIODARONE   Labwork: None ordered.  Testing/Procedures: None ordered.  Follow-Up: Your physician wants you to follow-up in: 3 months with Dr. Ladona Ridgel at the [facility] office.    July 13, 2019 at 11:45 AM.  Please arrive at 11:30 am to check in.   Any Other Special Instructions Will Be Listed Below (If Applicable).  If you need a refill on your cardiac medications before your next appointment, please call your pharmacy.
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
## 2019-05-09 ENCOUNTER — Telehealth: Payer: Self-pay | Admitting: Internal Medicine

## 2019-05-09 DIAGNOSIS — I480 Paroxysmal atrial fibrillation: Secondary | ICD-10-CM

## 2019-05-09 MED ORDER — METOPROLOL SUCCINATE ER 50 MG PO TB24: 50.0000 mg | ORAL_TABLET | Freq: Every day | ORAL | 3 refills | Status: DC

## 2019-05-09 NOTE — Telephone Encounter (Signed)
Attempted to call Johnny Rodriguez with [facility].  Call rang and rang and no VM.  Sent prescription to Pt's listed pharmacy of [facility] on [address].  Attempted to call Pt-Person that answered phone number states that it is not his number.  Await further needs-prescription sent.

## 2019-05-09 NOTE — Telephone Encounter (Signed)
Pt c/o medication issue:  1. Name of Medication: metoprolol succinate (TOPROL-XL) 50 MG 24 hr tablet  2. How are you currently taking this medication (dosage and times per day)? As directed  3. Are you having a reaction (difficulty breathing--STAT)? no  4. What is your medication issue? Cara from [facility] is trying to figure out who will be refilling this patients Metoprolol. She states that the patients PCP states he did not prescribe it and will not refill. She would like to speak with someone in Dr. Lubertha Basque office in regards to getting this medication refilled through [place].

## 2019-06-15 ENCOUNTER — Emergency Department (HOSPITAL_COMMUNITY): Payer: Medicare Other

## 2019-06-15 ENCOUNTER — Other Ambulatory Visit: Payer: Self-pay

## 2019-06-15 ENCOUNTER — Inpatient Hospital Stay (HOSPITAL_COMMUNITY)
Admission: EM | Admit: 2019-06-15 | Discharge: 2019-06-17 | DRG: 190 | Disposition: A | Discharge status: Home Health | Payer: Medicare Other | Attending: Internal Medicine | Admitting: Internal Medicine

## 2019-06-15 DIAGNOSIS — Z20822 Contact with and (suspected) exposure to covid-19: Secondary | ICD-10-CM | POA: Diagnosis not present

## 2019-06-15 DIAGNOSIS — I2510 Atherosclerotic heart disease of native coronary artery without angina pectoris: Secondary | ICD-10-CM | POA: Diagnosis present

## 2019-06-15 DIAGNOSIS — Z825 Family history of asthma and other chronic lower respiratory diseases: Secondary | ICD-10-CM

## 2019-06-15 DIAGNOSIS — Z9119 Patient's noncompliance with other medical treatment and regimen: Secondary | ICD-10-CM | POA: Diagnosis not present

## 2019-06-15 DIAGNOSIS — Z66 Do not resuscitate: Secondary | ICD-10-CM | POA: Diagnosis not present

## 2019-06-15 DIAGNOSIS — Z79899 Other long term (current) drug therapy: Secondary | ICD-10-CM | POA: Diagnosis not present

## 2019-06-15 DIAGNOSIS — E785 Hyperlipidemia, unspecified: Secondary | ICD-10-CM | POA: Diagnosis present

## 2019-06-15 DIAGNOSIS — J441 Chronic obstructive pulmonary disease with (acute) exacerbation: Secondary | ICD-10-CM | POA: Diagnosis present

## 2019-06-15 DIAGNOSIS — Z8249 Family history of ischemic heart disease and other diseases of the circulatory system: Secondary | ICD-10-CM

## 2019-06-15 DIAGNOSIS — Z823 Family history of stroke: Secondary | ICD-10-CM

## 2019-06-15 DIAGNOSIS — I5032 Chronic diastolic (congestive) heart failure: Secondary | ICD-10-CM | POA: Diagnosis not present

## 2019-06-15 DIAGNOSIS — I700 Atherosclerosis of aorta: Secondary | ICD-10-CM | POA: Diagnosis not present

## 2019-06-15 DIAGNOSIS — F1010 Alcohol abuse, uncomplicated: Secondary | ICD-10-CM | POA: Diagnosis present

## 2019-06-15 DIAGNOSIS — I429 Cardiomyopathy, unspecified: Secondary | ICD-10-CM | POA: Diagnosis present

## 2019-06-15 DIAGNOSIS — F17210 Nicotine dependence, cigarettes, uncomplicated: Secondary | ICD-10-CM | POA: Diagnosis not present

## 2019-06-15 DIAGNOSIS — Z9981 Dependence on supplemental oxygen: Secondary | ICD-10-CM | POA: Diagnosis not present

## 2019-06-15 DIAGNOSIS — Z7902 Long term (current) use of antithrombotics/antiplatelets: Secondary | ICD-10-CM | POA: Diagnosis not present

## 2019-06-15 DIAGNOSIS — I110 Hypertensive heart disease with heart failure: Secondary | ICD-10-CM | POA: Diagnosis present

## 2019-06-15 DIAGNOSIS — I4819 Other persistent atrial fibrillation: Secondary | ICD-10-CM | POA: Diagnosis present

## 2019-06-15 DIAGNOSIS — I480 Paroxysmal atrial fibrillation: Secondary | ICD-10-CM

## 2019-06-15 DIAGNOSIS — I4891 Unspecified atrial fibrillation: Secondary | ICD-10-CM | POA: Diagnosis not present

## 2019-06-15 DIAGNOSIS — J9621 Acute and chronic respiratory failure with hypoxia: Secondary | ICD-10-CM | POA: Diagnosis present

## 2019-06-15 DIAGNOSIS — F1290 Cannabis use, unspecified, uncomplicated: Secondary | ICD-10-CM | POA: Diagnosis not present

## 2019-06-15 DIAGNOSIS — F329 Major depressive disorder, single episode, unspecified: Secondary | ICD-10-CM | POA: Diagnosis present

## 2019-06-15 DIAGNOSIS — R569 Unspecified convulsions: Secondary | ICD-10-CM | POA: Diagnosis present

## 2019-06-15 LAB — COMPREHENSIVE METABOLIC PANEL
ALT: 17 U/L (ref 0–44)
AST: 20 U/L (ref 15–41)
Albumin: 3.9 g/dL (ref 3.5–5.0)
Alkaline Phosphatase: 62 U/L (ref 38–126)
Anion gap: 9 (ref 5–15)
BUN: 12 mg/dL (ref 8–23)
CO2: 33 mmol/L — ABNORMAL HIGH (ref 22–32)
Calcium: 9.3 mg/dL (ref 8.9–10.3)
Chloride: 96 mmol/L — ABNORMAL LOW (ref 98–111)
Creatinine, Ser: 1.13 mg/dL (ref 0.61–1.24)
GFR calc Af Amer: >60 mL/min (ref >60)
GFR calc non Af Amer: >60 mL/min (ref >60)
Glucose, Bld: 103 mg/dL — ABNORMAL HIGH (ref 70–99)
Potassium: 4.3 mmol/L (ref 3.5–5.1)
Sodium: 138 mmol/L (ref 135–145)
Total Bilirubin: 1 mg/dL (ref 0.3–1.2)
Total Protein: 6.7 g/dL (ref 6.5–8.1)

## 2019-06-15 LAB — CBC WITH DIFFERENTIAL/PLATELET
Abs Immature Granulocytes: 0.01 10*3/uL (ref 0.00–0.07)
Basophils Absolute: 0 10*3/uL (ref 0.0–0.1)
Basophils Relative: 0 %
Eosinophils Absolute: 0.1 10*3/uL (ref 0.0–0.5)
Eosinophils Relative: 1 %
HCT: 43.3 % (ref 39.0–52.0)
Hemoglobin: 13.9 g/dL (ref 13.0–17.0)
Immature Granulocytes: 0 %
Lymphocytes Relative: 14 %
Lymphs Abs: 1 10*3/uL (ref 0.7–4.0)
MCH: 30.6 pg (ref 26.0–34.0)
MCHC: 32.1 g/dL (ref 30.0–36.0)
MCV: 95.4 fL (ref 80.0–100.0)
Monocytes Absolute: 0.6 10*3/uL (ref 0.1–1.0)
Monocytes Relative: 8 %
Neutro Abs: 5.4 10*3/uL (ref 1.7–7.7)
Neutrophils Relative %: 77 %
Platelets: 217 10*3/uL (ref 150–400)
RBC: 4.54 MIL/uL (ref 4.22–5.81)
RDW: 13.3 % (ref 11.5–15.5)
WBC: 7 10*3/uL (ref 4.0–10.5)
nRBC: 0 % (ref 0.0–0.2)

## 2019-06-15 LAB — PROTIME-INR
INR: 1 (ref 0.8–1.2)
Prothrombin Time: 12.7 seconds (ref 11.4–15.2)

## 2019-06-15 LAB — RESPIRATORY PANEL BY RT PCR (FLU A&B, COVID)
Influenza A by PCR: NEGATIVE
Influenza B by PCR: NEGATIVE
SARS Coronavirus 2 by RT PCR: NEGATIVE

## 2019-06-15 LAB — TROPONIN I (HIGH SENSITIVITY)
Troponin I (High Sensitivity): 5 ng/L (ref <18)
Troponin I (High Sensitivity): 5 ng/L (ref <18)

## 2019-06-15 LAB — BRAIN NATRIURETIC PEPTIDE: B Natriuretic Peptide: 55.8 pg/mL (ref 0.0–100.0)

## 2019-06-15 MED ORDER — DILTIAZEM HCL-DEXTROSE 125-5 MG/125ML-% IV SOLN (PREMIX)
5.0000 mg/h | INTRAVENOUS | Status: DC
  Administered 2019-06-15: 5 mg/h via INTRAVENOUS
  Filled 2019-06-15: qty 125

## 2019-06-15 MED ORDER — ALBUTEROL SULFATE HFA 108 (90 BASE) MCG/ACT IN AERS
4.0000 | INHALATION_SPRAY | Freq: Once | RESPIRATORY_TRACT | Status: AC
  Administered 2019-06-15: 4 via RESPIRATORY_TRACT
  Filled 2019-06-15: qty 6.7

## 2019-06-15 MED ORDER — DILTIAZEM HCL 25 MG/5ML IV SOLN
10.0000 mg | Freq: Once | INTRAVENOUS | Status: AC
  Administered 2019-06-15: 10 mg via INTRAVENOUS

## 2019-06-15 MED ORDER — IOHEXOL 300 MG/ML  SOLN
100.0000 mL | Freq: Once | INTRAMUSCULAR | Status: AC | PRN
  Administered 2019-06-15: 100 mL via INTRAVENOUS

## 2019-06-15 MED ORDER — MORPHINE SULFATE (PF) 4 MG/ML IV SOLN
4.0000 mg | Freq: Once | INTRAVENOUS | Status: AC
  Administered 2019-06-15: 4 mg via INTRAVENOUS
  Filled 2019-06-15: qty 1

## 2019-06-15 MED ORDER — METHYLPREDNISOLONE SODIUM SUCC 125 MG IJ SOLR
125.0000 mg | Freq: Once | INTRAMUSCULAR | Status: AC
  Administered 2019-06-15: 125 mg via INTRAVENOUS
  Filled 2019-06-15: qty 2

## 2019-06-15 NOTE — H&P (Signed)
History and Physical    Johnny Rodriguez KPT:[id number] DOB: [birth year] DOA: 06/15/2019  PCP: Gwenyth Bender, MD  Patient coming from: Home  I have personally briefly reviewed patient's old medical records in [facility]  Chief Complaint: SOB  HPI: Johnny Rodriguez is a 69 y.o. male with medical history significant of CAD, COPD/Chronic hypoxemic resp failure on 3L home oxygen, HTN, Paroxysmal Atrial Fibrillation, Tobacco use disorder who presents with worsening SOB/chest tightness.  Patient reports for the past two to three days, he has noticed increased SOB, chest tightness and palpitations.  He reports his [facility] RN came to his home and felt he was very short of breath and wanted him to come in and get evaluated.  He reports he has been having increased sputum production.  He reports he was noticeably sweaty today as well. He states he was having palpitations and chest tightness and his [facility] RN told him his HR was all over the place. He denies fevers, no chills.  He denies nausea/vomiting.  No diarrhea.  He lives independently.  He is a heavy smoker - 2 ppd.  He states he does not smoke with oxygen on.  He does drink a beer every now and then.  He states he smokes cannabis on occasion.  At baseline, he has a functional limitation in activity to about 20 feet, reports in the past couple of days he is able to tolerate half the distance.  He needs assistance with bathing, dressing.  He uses cane/walker to mobilize.  Review of Systems: As per HPI otherwise 10 point review of systems negative.    Past Medical History:  Diagnosis Date  . AF (atrial fibrillation) (HCC)   . Alcohol abuse   . Angina pectoris   . CAD (coronary artery disease) 12/15/2004  . COPD (chronic obstructive pulmonary disease) (HCC)   . Coronary artery disease   . Heavy cigarette smoker   . HTN (hypertension)   . Idiopathic cardiomyopathy (HCC) 07/16/2009  . Non compliance with medical treatment   . Seizures (HCC)     Past  Surgical History:  Procedure Laterality Date  . CARDIAC CATHETERIZATION  2006  . CARDIAC CATHETERIZATION  12/2004   Left   . CARDIOVERSION  01/21/2011   Procedure: CARDIOVERSION;  Surgeon: Darden Palmer., MD;  Location: [facility] OR;  Service: Cardiovascular;  Laterality: N/A;  . DOPPLER ECHOCARDIOGRAPHY  02/2011  . ELECTROPHYSIOLOGY STUDY N/A 12/16/2011   Procedure: ELECTROPHYSIOLOGY STUDY;  Surgeon: Marinus Maw, MD;  Location: [facility] CATH LAB;  Service: Cardiovascular;  Laterality: N/A;  . EP IMPLANTABLE DEVICE N/A 08/24/2015   Procedure: Loop Recorder Removal;  Surgeon: Marinus Maw, MD;  Location: [facility] INVASIVE CV LAB;  Service: Cardiovascular;  Laterality: N/A;  . fx collar bone    . NO PAST SURGERIES       reports that he has been smoking. He has a 58.00 pack-year smoking history. He has never used smokeless tobacco. He reports current alcohol use of about 3.0 standard drinks of alcohol per week. He reports that he does not use drugs.  No Known Allergies  Family History  Problem Relation Age of Onset  . Suicidality Sister   . CVA Mother   . Stroke Mother   . Heart disease Father   . Coronary artery disease Father   . COPD Father      Prior to Admission medications   Medication Sig Start Date End Date Taking? Authorizing Provider  albuterol (PROAIR  HFA) 108 (90 Base) MCG/ACT inhaler Inhale 1-2 puffs into the lungs every 6 (six) hours as needed for wheezing or shortness of breath. 02/09/19  Yes Benjiman Core, MD  dabigatran (PRADAXA) 150 MG CAPS capsule Take 1 capsule (150 mg total) by mouth 2 (two) times daily. Medicaid [id number] O 02/09/19  Yes Benjiman Core, MD  diltiazem (CARDIZEM CD) 240 MG 24 hr capsule Take 1 capsule (240 mg total) by mouth daily. Medicaid [id number] O 02/09/19 06/15/19 Yes Benjiman Core, MD  Ergocalciferol (VITAMIN D2) 2000 units TABS Take 2,000 Units by mouth daily.    Yes [provider]  folic acid (FOLVITE) 1 MG tablet Take 1 tablet (1  mg total) by mouth daily. 04/04/17  Yes Hongalgi, Maximino Greenland, MD  furosemide (LASIX) 40 MG tablet Take 1 tablet (40 mg total) by mouth daily. Medicaid [id number] O 04/03/17 06/15/19 Yes Hongalgi, Maximino Greenland, MD  losartan (COZAAR) 25 MG tablet Take 1 tablet (25 mg total) by mouth daily. Medicaid [id number] O 02/09/19 06/15/19 Yes Benjiman Core, MD  metoprolol succinate (TOPROL-XL) 50 MG 24 hr tablet Take 1 tablet (50 mg total) by mouth daily. Take with or immediately following a meal. Medicaid [id number] O 05/09/19  Yes Marinus Maw, MD  mometasone-formoterol [facility]) 200-5 MCG/ACT AERO Inhale 2 puffs into the lungs 2 (two) times daily. Medicaid [id number] O Patient taking differently: Inhale 2 puffs into the lungs 2 (two) times daily.  02/09/19  Yes Benjiman Core, MD  Multiple Vitamin (MULTIVITAMIN WITH MINERALS) TABS tablet Take 1 tablet by mouth daily. 04/04/17  Yes Hongalgi, Maximino Greenland, MD  OXYGEN Inhale 3 L into the lungs continuous.    Yes [provider]  polyethylene glycol (MIRALAX / GLYCOLAX) 17 g packet Take 17 g by mouth daily. 03/15/19  Yes Dellia Cloud, MD  senna (SENOKOT) 8.6 MG TABS tablet Take 1-2 tablets (8.6-17.2 mg total) by mouth daily. 03/15/19  Yes Dellia Cloud, MD  sertraline (ZOLOFT) 100 MG tablet Take 100 mg by mouth daily.   Yes [provider]  simvastatin (ZOCOR) 20 MG tablet Take 20 mg by mouth at bedtime. 06/04/19  Yes [provider]  thiamine 100 MG tablet Take 1 tablet (100 mg total) by mouth daily. 04/04/17  Yes Hongalgi, Maximino Greenland, MD  TIADYLT ER 240 MG 24 hr capsule Take 240 mg by mouth daily. 05/02/19  Yes [provider]  tiotropium (SPIRIVA) 18 MCG inhalation capsule Place 1 capsule (18 mcg total) into inhaler and inhale daily. Patient taking differently: Place 18 mcg into inhaler and inhale daily.  02/09/19  York Spaniel, MD    Physical Exam: Vitals:   06/15/19 1945 06/15/19 2016 06/15/19 2030 06/15/19 2115  BP: 121/83 (!) 148/103  (!) 135/106 (!) 121/91  Pulse: (!) 108 70 (!) 34 (!) 42  Resp: (!) 28 (!) 29 (!) 31 (!) 26  Temp:      TempSrc:      SpO2: 97% 94% 97% 91%  Weight:      Height:        Vitals:   06/15/19 1945 06/15/19 2016 06/15/19 2030 06/15/19 2115  BP: 121/83 (!) 148/103 (!) 135/106 (!) 121/91  Pulse: (!) 108 70 (!) 34 (!) 42  Resp: (!) 28 (!) 29 (!) 31 (!) 26  Temp:      TempSrc:      SpO2: 97% 94% 97% 91%  Weight:      Height:         Constitutional: pleasant, NAD Eyes: EOMI, lids and conjunctivae normal  ENMT: Mucous membranes are moist. Posterior pharynx clear of any exudate or lesions. Normal dentition.  Neck: normal, supple, no masses, no thyromegaly Respiratory: diffuse wheezing, prolonged expiratory phase, very tight Cardiovascular: irregular rate and rhythm, no murmurs / rubs / gallops. No extremity edema. 2+ pedal pulses. No carotid bruits.  Abdomen: no tenderness, no masses palpated. No hepatosplenomegaly. Bowel sounds positive.  Musculoskeletal: no clubbing / cyanosis. No joint deformity upper and lower extremities. Good ROM, no contractures. Normal muscle tone.  Skin: no rashes, lesions, ulcers. No induration Neurologic: CN 2-12 grossly intact. Sensation and strength grossly non-focal Psychiatric: Normal judgment and insight. Alert and oriented x 3. Normal mood.    Labs on Admission: I have personally reviewed following labs and imaging studies  CBC: Recent Labs  Lab 06/15/19 1533  WBC 7.0  NEUTROABS 5.4  HGB 13.9  HCT 43.3  MCV 95.4  PLT 217   Basic Metabolic Panel: Recent Labs  Lab 06/15/19 1533  NA 138  K 4.3  CL 96*  CO2 33*  GLUCOSE 103*  BUN 12  CREATININE 1.13  CALCIUM 9.3   GFR: Estimated Creatinine Clearance: 61 mL/min (by C-G formula based on SCr of 1.13 mg/dL). Liver Function Tests: Recent Labs  Lab 06/15/19 1533  AST 20  ALT 17  ALKPHOS 62  BILITOT 1.0  PROT 6.7  ALBUMIN 3.9   No results for input(s): LIPASE, AMYLASE in the last 168  hours. No results for input(s): AMMONIA in the last 168 hours. Coagulation Profile: Recent Labs  Lab 06/15/19 1533  INR 1.0   Cardiac Enzymes: No results for input(s): CKTOTAL, CKMB, CKMBINDEX, TROPONINI in the last 168 hours. BNP (last 3 results) No results for input(s): PROBNP in the last 8760 hours. HbA1C: No results for input(s): HGBA1C in the last 72 hours. CBG: No results for input(s): GLUCAP in the last 168 hours. Lipid Profile: No results for input(s): CHOL, HDL, LDLCALC, TRIG, CHOLHDL, LDLDIRECT in the last 72 hours. Thyroid Function Tests: No results for input(s): TSH, T4TOTAL, FREET4, T3FREE, THYROIDAB in the last 72 hours. Anemia Panel: No results for input(s): VITAMINB12, FOLATE, FERRITIN, TIBC, IRON, RETICCTPCT in the last 72 hours. Urine analysis:    Component Value Date/Time   COLORURINE YELLOW 12/08/2012 1516   APPEARANCEUR CLEAR 12/08/2012 1516   LABSPEC 1.030 12/08/2012 1516   PHURINE 6.0 12/08/2012 1516   GLUCOSEU NEGATIVE 12/08/2012 1516   HGBUR NEGATIVE 12/08/2012 1516   BILIRUBINUR NEGATIVE 12/08/2012 1516   KETONESUR NEGATIVE 12/08/2012 1516   PROTEINUR NEGATIVE 12/08/2012 1516   UROBILINOGEN 0.2 12/08/2012 1516   NITRITE NEGATIVE 12/08/2012 1516   LEUKOCYTESUR NEGATIVE 12/08/2012 1516    Radiological Exams on Admission: CT ABDOMEN PELVIS W CONTRAST  Result Date: 06/15/2019 CLINICAL DATA:  Shortness of breath, worsening chest pain for 7 days EXAM: CT ABDOMEN AND PELVIS WITH CONTRAST TECHNIQUE: Multidetector CT imaging of the abdomen and pelvis was performed using the standard protocol following bolus administration of intravenous contrast. CONTRAST:  OMNIPAQUE IOHEXOL 300 MG/ML  SOLN COMPARISON:  03/11/2019 FINDINGS: Lower chest: No acute abnormality. Hepatobiliary: No focal liver abnormality is seen. No gallstones, gallbladder wall thickening, or biliary dilatation. Pancreas: Unremarkable. No pancreatic ductal dilatation or surrounding  inflammatory changes. Spleen: Normal in size without focal abnormality. Adrenals/Urinary Tract: Adrenal glands are unremarkable. Kidneys are normal, without renal calculi, focal lesion, or hydronephrosis. Bladder is unremarkable. Stomach/Bowel: No bowel obstruction or ileus. Normal appendix right lower quadrant. No bowel wall thickening or inflammatory changes. Vascular/Lymphatic: Aortic atherosclerosis. No  enlarged abdominal or pelvic lymph nodes. Reproductive: Prostate is unremarkable. Other: No abdominal wall hernia or abnormality. No abdominopelvic ascites. Musculoskeletal: No acute or destructive bony lesions. Reconstructed images demonstrate no additional findings. IMPRESSION: 1. No acute intra-abdominal or intrapelvic process. 2.  Aortic Atherosclerosis (ICD10-I70.0). Electronically Signed   By: Randa Ngo M.D.   On: 06/15/2019 20:12   DG Chest Port 1 View  Result Date: 06/15/2019 CLINICAL DATA:  Shortness of breath EXAM: PORTABLE CHEST 1 VIEW COMPARISON:  Chest x-rays dated 03/25/2019 and 09/13/2003. FINDINGS: Heart size and mediastinal contours are within normal limits. Aortic atherosclerosis. Lungs are clear. No pleural effusion or pneumothorax is seen. Osseous structures about the chest are unremarkable. IMPRESSION: 1. No active disease. No evidence of pneumonia or pulmonary edema. 2. Aortic atherosclerosis. Electronically Signed   By: Franki Cabot M.D.   On: 06/15/2019 15:26    EKG: Independently reviewed  Assessment/Plan Johnny Rodriguez is a 69 y.o. male with medical history significant of CAD, COPD/Chronic hypoxemic resp failure on 3L home oxygen, HTN, Paroxysmal Atrial Fibrillation, Tobacco use disorder who presents with worsening SOB/chest tightness with acute on chronic hypoxemic resp failure 2/2 COPD exacerbation presenting in A. Fib with RVR.  # Persistent Atrial Fibrillation with RVR - patient presented in RVR to 150s, mentation intact, normotensive.  Patient initiated on diltiazem  gtt in ER, no prior EF on both diltiazem and metoprolol PO at home.  Continue management with diltiazem gtt and wean off and continue oral agents.  Continue metoprolol for now.  Held losartan to allow for blood pressure. - monitor electrolytes, maintain K > 4 and Mg > 2 - will obtain Echo - continue pradaxa  # Acute on chronic hypoxemic respiratory failure # Acute exacerbation of COPD - patient with chronic 3 L requirement, now up to 5L, with increased SOB/DOE and sputum production.  CXR no acute findings, unclear precipitant but very wheezy and tight on exam. - patient received solumedrol and lasix in ER though not particularly volume overloaded on my exam - continue prednisone 40 mg daily - azithromycin for COPD exac - duonebs, RT consult - oxygen supplementation  # Chronic Diastolic CHF - patient received dose of IV Lasix, but not notably overloaded - continue home medications - but held losartan for BP in the event of hypotension while on dilt gtt - continue lasix and metoprolol  # HTN  - continue metoprolol, on dilt gtt - transition to home meds once gtt is weaned off  # HLD - continue simvastatin  # Tobacco use disorder - current 2 ppd smoker, extensively counseled, expressed concern for concurrent oxygen use and how smoking may affect quality - consider nicotine replacement as needed  # Depression - continue sertaline  DVT prophylaxis: on Pradaxa Code Status: DNR/DNI Admission status: cardiac tele   Truddie Hidden MD [facility] Pager [phone]  If 7PM-7AM, please contact night-coverage [email or web address] Password [email or web address]  06/15/2019, 9:34 PM

## 2019-06-15 NOTE — ED Notes (Signed)
PT put on [place] at 5 L, 02 sat maintaining at 95-97%

## 2019-06-15 NOTE — ED Provider Notes (Signed)
[facility] EMERGENCY DEPARTMENT Provider Note   CSN: [id number] Arrival date & time: 06/15/19  1346     History Chief Complaint  Patient presents with  . Chest Pain  . Shortness of Breath    MATHIUS BIRKELAND is a 69 y.o. male.  Patient is a 69 year old male with past medical history of coronary artery disease, COPD, CHF, hypertension, and persistent A. fib. Patient is on home oxygen at 3 L nasal cannula 24/7. He presents today with worsening breathing and chest tightness for the past week. He describes being unable to walk except for short distances. He does report some cough which has been intermittently productive, but denies fevers or chills. He does describe some heaviness in his chest with no associated nausea or diaphoresis. He reports being compliant with his medications.  The history is provided by the patient.  Chest Pain Pain location:  Substernal area Pain quality comment:  Heaviness Pain radiates to:  Does not radiate Pain severity:  Moderate Onset quality:  Gradual Duration:  1 week Timing:  Constant Progression:  Worsening Context comment:  Exertion Relieved by:  Nothing Worsened by:  Exertion Ineffective treatments:  None tried      Past Medical History:  Diagnosis Date  . AF (atrial fibrillation) (HCC)   . Alcohol abuse   . Angina pectoris   . CAD (coronary artery disease) 12/15/2004  . COPD (chronic obstructive pulmonary disease) (HCC)   . Coronary artery disease   . Heavy cigarette smoker   . HTN (hypertension)   . Idiopathic cardiomyopathy (HCC) 07/16/2009  . Non compliance with medical treatment   . Seizures [facility])     Patient Active Problem List   Diagnosis Date Noted  . COPD with acute exacerbation (HCC) 03/11/2019  . AKI (acute kidney injury) (HCC) 03/10/2019  . Bowel obstruction (HCC) 03/10/2019  . Chronic respiratory failure with hypoxia (HCC) 10/15/2016  . Cigarette smoker 09/09/2016  . Long term current use of  anticoagulant therapy 06/24/2016  . History of syncope 06/24/2016  . COPD exacerbation (HCC) 02/08/2015  . Essential hypertension 02/08/2015  . Syncope 12/10/2011  . History of noncompliance with medical treatment 12/01/2011  . Chronic systolic heart failure (HCC) 04/10/2011  . Idiopathic cardiomyopathy (HCC) 07/16/2009  . Paroxysmal atrial fibrillation (HCC) 07/16/2009  . COPD PFT's pending  07/16/2009  . CAD (coronary artery disease) 12/15/2004    Past Surgical History:  Procedure Laterality Date  . CARDIAC CATHETERIZATION  2006  . CARDIAC CATHETERIZATION  12/2004   Left   . CARDIOVERSION  01/21/2011   Procedure: CARDIOVERSION;  Surgeon: Darden Palmer., MD;  Location: [facility] OR;  Service: Cardiovascular;  Laterality: N/A;  . DOPPLER ECHOCARDIOGRAPHY  02/2011  . ELECTROPHYSIOLOGY STUDY N/A 12/16/2011   Procedure: ELECTROPHYSIOLOGY STUDY;  Surgeon: Marinus Maw, MD;  Location: [facility] CATH LAB;  Service: Cardiovascular;  Laterality: N/A;  . EP IMPLANTABLE DEVICE N/A 08/24/2015   Procedure: Loop Recorder Removal;  Surgeon: Marinus Maw, MD;  Location: [facility] INVASIVE CV LAB;  Service: Cardiovascular;  Laterality: N/A;  . fx collar bone    . NO PAST SURGERIES         Family History  Problem Relation Age of Onset  . Suicidality Sister   . CVA Mother   . Stroke Mother   . Heart disease Father   . Coronary artery disease Father   . COPD Father     Social History   Tobacco Use  . Smoking status: Current  Every Day Smoker    Packs/day: 1.00    Years: 58.00    Pack years: 58.00  . Smokeless tobacco: Never Used  . Tobacco comment: pt states he is down to 1ppd  Substance Use Topics  . Alcohol use: Yes    Alcohol/week: 3.0 standard drinks    Types: 3 Cans of beer per week    Comment: EtOH use diorder - liquor  . Drug use: No    Home Medications Prior to Admission medications   Medication Sig Start Date End Date Taking? Authorizing Provider  albuterol (PROAIR HFA) 108 (90  Base) MCG/ACT inhaler Inhale 1-2 puffs into the lungs every 6 (six) hours as needed for wheezing or shortness of breath. 02/09/19   Benjiman Core, MD  dabigatran (PRADAXA) 150 MG CAPS capsule Take 1 capsule (150 mg total) by mouth 2 (two) times daily. Medicaid [id number] [place] 02/09/19   Benjiman Core, MD  diltiazem (CARDIZEM CD) 240 MG 24 hr capsule Take 1 capsule (240 mg total) by mouth daily. Medicaid [id number] O 02/09/19 03/11/19  Benjiman Core, MD  Ergocalciferol (VITAMIN D2) 2000 units TABS Take 1 tablet by mouth daily.    [provider]  folic acid (FOLVITE) 1 MG tablet Take 1 tablet (1 mg total) by mouth daily. 04/04/17   Hongalgi, Maximino Greenland, MD  furosemide (LASIX) 40 MG tablet Take 1 tablet (40 mg total) by mouth daily. Medicaid [id number] O 04/03/17 05/03/17  Hongalgi, Maximino Greenland, MD  losartan (COZAAR) 25 MG tablet Take 1 tablet (25 mg total) by mouth daily. Medicaid [id number] O 02/09/19 03/11/19  Benjiman Core, MD  metoprolol succinate (TOPROL-XL) 50 MG 24 hr tablet Take 1 tablet (50 mg total) by mouth daily. Take with or immediately following a meal. Medicaid [id number] O 05/09/19   Marinus Maw, MD  mometasone-formoterol [facility]) 200-5 MCG/ACT AERO Inhale 2 puffs into the lungs 2 (two) times daily. Medicaid [id number] [place] 02/09/19   Benjiman Core, MD  Multiple Vitamin (MULTIVITAMIN WITH MINERALS) TABS tablet Take 1 tablet by mouth daily. 04/04/17   Hongalgi, Maximino Greenland, MD  OXYGEN 3lpm 24/7  [facility]    [provider]  polyethylene glycol (MIRALAX / GLYCOLAX) 17 g packet Take 17 g by mouth daily. 03/15/19   Dellia Cloud, MD  senna (SENOKOT) 8.6 MG TABS tablet Take 1-2 tablets (8.6-17.2 mg total) by mouth daily. 03/15/19   Dellia Cloud, MD  sertraline (ZOLOFT) 100 MG tablet Take 100 mg by mouth daily.    [provider]  thiamine 100 MG tablet Take 1 tablet (100 mg total) by mouth daily. 04/04/17   Hongalgi, Maximino Greenland, MD  tiotropium (SPIRIVA) 18 MCG inhalation capsule Place 1  capsule (18 mcg total) into inhaler and inhale daily. 02/09/19   Benjiman Core, MD    Allergies    Patient has no known allergies.  Review of Systems   Review of Systems  All other systems reviewed and are negative.   Physical Exam Updated Vital Signs BP 108/90   Pulse 81   Temp 98.1 F (36.7 C) (Oral)   Resp (!) 30   Ht 5\' 11"  (1.803 m)   Wt 68 kg   SpO2 94%   BMI 20.92 kg/m   Physical Exam Vitals and nursing note reviewed.  Constitutional:      General: He is not in acute distress.    Appearance: He is well-developed. He is not diaphoretic.  HENT:     Head: Normocephalic and atraumatic.  Cardiovascular:     Rate and Rhythm: Tachycardia  present. Rhythm irregular.     Heart sounds: No murmur. No friction rub.  Pulmonary:     Effort: Pulmonary effort is normal. No respiratory distress.     Breath sounds: Normal breath sounds. No wheezing or rales.  Abdominal:     General: Bowel sounds are normal. There is no distension.     Palpations: Abdomen is soft.     Tenderness: There is no abdominal tenderness.  Musculoskeletal:        General: Normal range of motion.     Cervical back: Normal range of motion and neck supple.     Right lower leg: No tenderness. Edema present.     Left lower leg: No tenderness. Edema present.     Comments: There is trace edema of both lower extremities. DP pulses are easily palpable.  Skin:    General: Skin is warm and dry.  Neurological:     Mental Status: He is alert and oriented to person, place, and time.     Coordination: Coordination normal.     ED Results / Procedures / Treatments   Labs (all labs ordered are listed, but only abnormal results are displayed) Labs Reviewed  COMPREHENSIVE METABOLIC PANEL  CBC WITH DIFFERENTIAL/PLATELET  BRAIN NATRIURETIC PEPTIDE  PROTIME-INR  TROPONIN I (HIGH SENSITIVITY)    EKG EKG Interpretation  Date/Time:  Wednesday Jun 15 2019 13:58:29 EDT Ventricular Rate:  131 PR Interval:      QRS Duration: 89 QT Interval:  322 QTC Calculation: 481 R Axis:   -88 Text Interpretation: Atrial fibrillation Left anterior fascicular block Anterior infarct, old No significant change since 03/25/2019 Confirmed by Veryl Speak [phone]) on 06/15/2019 3:05:17 PM   Radiology No results found.  Procedures Procedures (including critical care time)  Medications Ordered in ED Medications  albuterol (VENTOLIN HFA) 108 (90 Base) MCG/ACT inhaler 4 puff (has no administration in time range)  methylPREDNISolone sodium succinate (SOLU-MEDROL) 125 mg/2 mL injection 125 mg (has no administration in time range)    ED Course  I have reviewed the triage vital signs and the nursing notes.  Pertinent labs & imaging results that were available during my care of the patient were reviewed by me and considered in my medical decision making (see chart for details).    MDM Rules/Calculators/A&P  Patient is a 69 year old male with history of A. fib, COPD, CHF, coronary artery disease.  He is on home oxygen at 3 L by nasal cannula 24/7.  He presents today with worsening breathing over the past week.  This appears to be related to an exacerbation of his COPD.  He is feeling somewhat better after receiving steroids and albuterol.  He was also given IV Lasix.  Patient with continued increased oxygen requirement and I feel will require admission.  While waiting for hospitalist evaluation, he developed tachycardia with A. fib at a persistent rate in the 140s.  Patient required Cardizem for rate control.  CRITICAL CARE Performed by: Veryl Speak Total critical care time: 35 minutes Critical care time was exclusive of separately billable procedures and treating other patients. Critical care was necessary to treat or prevent imminent or life-threatening deterioration. Critical care was time spent personally by me on the following activities: development of treatment plan with patient and/or surrogate as well as  nursing, discussions with consultants, evaluation of patient's response to treatment, examination of patient, obtaining history from patient or surrogate, ordering and performing treatments and interventions, ordering and review of laboratory studies, ordering and review of radiographic  studies, pulse oximetry and re-evaluation of patient's condition.   Final Clinical Impression(s) / ED Diagnoses Final diagnoses:  None    Rx / DC Orders ED Discharge Orders    None       Geoffery Lyons, MD 06/15/19 2333

## 2019-06-15 NOTE — ED Triage Notes (Signed)
PT BIB [facility] from home. PT reports c/o chest pain x 7 days and worsening shortness of breath. EMS reports attempting to give DUO NEB but PT unable to tolerate with worsening chest pain. HR in 130 with labored breathing on 10 L NRB.

## 2019-06-16 ENCOUNTER — Inpatient Hospital Stay (HOSPITAL_COMMUNITY): Payer: Medicare Other

## 2019-06-16 DIAGNOSIS — E785 Hyperlipidemia, unspecified: Secondary | ICD-10-CM | POA: Diagnosis not present

## 2019-06-16 DIAGNOSIS — Z720 Tobacco use: Secondary | ICD-10-CM

## 2019-06-16 DIAGNOSIS — Z825 Family history of asthma and other chronic lower respiratory diseases: Secondary | ICD-10-CM | POA: Diagnosis not present

## 2019-06-16 DIAGNOSIS — Z66 Do not resuscitate: Secondary | ICD-10-CM | POA: Diagnosis not present

## 2019-06-16 DIAGNOSIS — I429 Cardiomyopathy, unspecified: Secondary | ICD-10-CM | POA: Diagnosis not present

## 2019-06-16 DIAGNOSIS — Z9119 Patient's noncompliance with other medical treatment and regimen: Secondary | ICD-10-CM | POA: Diagnosis not present

## 2019-06-16 DIAGNOSIS — Z8249 Family history of ischemic heart disease and other diseases of the circulatory system: Secondary | ICD-10-CM | POA: Diagnosis not present

## 2019-06-16 DIAGNOSIS — I4891 Unspecified atrial fibrillation: Secondary | ICD-10-CM

## 2019-06-16 DIAGNOSIS — J441 Chronic obstructive pulmonary disease with (acute) exacerbation: Secondary | ICD-10-CM | POA: Diagnosis present

## 2019-06-16 DIAGNOSIS — I480 Paroxysmal atrial fibrillation: Secondary | ICD-10-CM | POA: Diagnosis not present

## 2019-06-16 DIAGNOSIS — R569 Unspecified convulsions: Secondary | ICD-10-CM | POA: Diagnosis not present

## 2019-06-16 DIAGNOSIS — Z9981 Dependence on supplemental oxygen: Secondary | ICD-10-CM | POA: Diagnosis not present

## 2019-06-16 DIAGNOSIS — F17210 Nicotine dependence, cigarettes, uncomplicated: Secondary | ICD-10-CM | POA: Diagnosis not present

## 2019-06-16 DIAGNOSIS — I2510 Atherosclerotic heart disease of native coronary artery without angina pectoris: Secondary | ICD-10-CM | POA: Diagnosis not present

## 2019-06-16 DIAGNOSIS — I4819 Other persistent atrial fibrillation: Secondary | ICD-10-CM | POA: Diagnosis not present

## 2019-06-16 DIAGNOSIS — F1010 Alcohol abuse, uncomplicated: Secondary | ICD-10-CM | POA: Diagnosis not present

## 2019-06-16 DIAGNOSIS — F329 Major depressive disorder, single episode, unspecified: Secondary | ICD-10-CM | POA: Diagnosis not present

## 2019-06-16 DIAGNOSIS — Z7902 Long term (current) use of antithrombotics/antiplatelets: Secondary | ICD-10-CM | POA: Diagnosis not present

## 2019-06-16 DIAGNOSIS — I110 Hypertensive heart disease with heart failure: Secondary | ICD-10-CM | POA: Diagnosis not present

## 2019-06-16 DIAGNOSIS — I5032 Chronic diastolic (congestive) heart failure: Secondary | ICD-10-CM | POA: Diagnosis not present

## 2019-06-16 DIAGNOSIS — Z20822 Contact with and (suspected) exposure to covid-19: Secondary | ICD-10-CM | POA: Diagnosis not present

## 2019-06-16 DIAGNOSIS — F1290 Cannabis use, unspecified, uncomplicated: Secondary | ICD-10-CM | POA: Diagnosis not present

## 2019-06-16 DIAGNOSIS — Z823 Family history of stroke: Secondary | ICD-10-CM | POA: Diagnosis not present

## 2019-06-16 DIAGNOSIS — J9621 Acute and chronic respiratory failure with hypoxia: Secondary | ICD-10-CM

## 2019-06-16 DIAGNOSIS — Z79899 Other long term (current) drug therapy: Secondary | ICD-10-CM | POA: Diagnosis not present

## 2019-06-16 LAB — ECHOCARDIOGRAM COMPLETE
Height: 71 in
Weight: 2400 oz

## 2019-06-16 LAB — BASIC METABOLIC PANEL
Anion gap: 12 (ref 5–15)
BUN: 16 mg/dL (ref 8–23)
CO2: 33 mmol/L — ABNORMAL HIGH (ref 22–32)
Calcium: 9.7 mg/dL (ref 8.9–10.3)
Chloride: 95 mmol/L — ABNORMAL LOW (ref 98–111)
Creatinine, Ser: 1.34 mg/dL — ABNORMAL HIGH (ref 0.61–1.24)
GFR calc Af Amer: >60 mL/min (ref >60)
GFR calc non Af Amer: 54 mL/min — ABNORMAL LOW (ref >60)
Glucose, Bld: 216 mg/dL — ABNORMAL HIGH (ref 70–99)
Potassium: 5.4 mmol/L — ABNORMAL HIGH (ref 3.5–5.1)
Sodium: 140 mmol/L (ref 135–145)

## 2019-06-16 LAB — CBC
HCT: 45 % (ref 39.0–52.0)
Hemoglobin: 14.1 g/dL (ref 13.0–17.0)
MCH: 30.3 pg (ref 26.0–34.0)
MCHC: 31.3 g/dL (ref 30.0–36.0)
MCV: 96.6 fL (ref 80.0–100.0)
Platelets: 204 10*3/uL (ref 150–400)
RBC: 4.66 MIL/uL (ref 4.22–5.81)
RDW: 13.3 % (ref 11.5–15.5)
WBC: 7.6 10*3/uL (ref 4.0–10.5)
nRBC: 0 % (ref 0.0–0.2)

## 2019-06-16 MED ORDER — METOPROLOL SUCCINATE ER 50 MG PO TB24
50.0000 mg | ORAL_TABLET | Freq: Every day | ORAL | Status: DC
  Administered 2019-06-16 – 2019-06-17 (×2): 50 mg via ORAL
  Filled 2019-06-16: qty 1
  Filled 2019-06-16: qty 2

## 2019-06-16 MED ORDER — PREDNISONE 20 MG PO TABS: 40.0000 mg | ORAL_TABLET | Freq: Every day | ORAL | Status: DC

## 2019-06-16 MED ORDER — THIAMINE HCL 100 MG PO TABS
100.0000 mg | ORAL_TABLET | Freq: Every day | ORAL | Status: DC
  Administered 2019-06-16: 100 mg via ORAL
  Filled 2019-06-16: qty 1

## 2019-06-16 MED ORDER — MOMETASONE FURO-FORMOTEROL FUM 200-5 MCG/ACT IN AERO: 2.0000 | INHALATION_SPRAY | Freq: Two times a day (BID) | RESPIRATORY_TRACT | Status: DC

## 2019-06-16 MED ORDER — ADULT MULTIVITAMIN W/MINERALS CH
1.0000 | ORAL_TABLET | Freq: Every day | ORAL | Status: DC
  Administered 2019-06-16 – 2019-06-17 (×2): 1 via ORAL
  Filled 2019-06-16 (×2): qty 1

## 2019-06-16 MED ORDER — DILTIAZEM HCL ER COATED BEADS 240 MG PO CP24
240.0000 mg | ORAL_CAPSULE | Freq: Every day | ORAL | Status: DC
  Administered 2019-06-16 – 2019-06-17 (×2): 240 mg via ORAL
  Filled 2019-06-16: qty 1
  Filled 2019-06-16: qty 2

## 2019-06-16 MED ORDER — ALBUTEROL SULFATE HFA 108 (90 BASE) MCG/ACT IN AERS
1.0000 | INHALATION_SPRAY | Freq: Four times a day (QID) | RESPIRATORY_TRACT | Status: DC | PRN
  Filled 2019-06-16: qty 6.7

## 2019-06-16 MED ORDER — THIAMINE HCL 100 MG PO TABS
100.0000 mg | ORAL_TABLET | Freq: Every day | ORAL | Status: DC
  Administered 2019-06-16 – 2019-06-17 (×2): 100 mg via ORAL
  Filled 2019-06-16 (×2): qty 1

## 2019-06-16 MED ORDER — GUAIFENESIN-DM 100-10 MG/5ML PO SYRP
5.0000 mL | ORAL_SOLUTION | ORAL | Status: DC | PRN
  Administered 2019-06-16 – 2019-06-17 (×2): 5 mL via ORAL
  Filled 2019-06-16 (×2): qty 5

## 2019-06-16 MED ORDER — SENNA 8.6 MG PO TABS
1.0000 | ORAL_TABLET | Freq: Every day | ORAL | Status: DC
  Administered 2019-06-16 – 2019-06-17 (×2): 8.6 mg via ORAL
  Filled 2019-06-16 (×2): qty 1

## 2019-06-16 MED ORDER — FOLIC ACID 1 MG PO TABS
1.0000 mg | ORAL_TABLET | Freq: Every day | ORAL | Status: DC
  Administered 2019-06-16 – 2019-06-17 (×2): 1 mg via ORAL
  Filled 2019-06-16 (×2): qty 1

## 2019-06-16 MED ORDER — DABIGATRAN ETEXILATE MESYLATE 150 MG PO CAPS
150.0000 mg | ORAL_CAPSULE | Freq: Two times a day (BID) | ORAL | Status: DC
  Administered 2019-06-16 – 2019-06-17 (×3): 150 mg via ORAL
  Filled 2019-06-16 (×4): qty 1

## 2019-06-16 MED ORDER — METHYLPREDNISOLONE SODIUM SUCC 125 MG IJ SOLR
60.0000 mg | Freq: Three times a day (TID) | INTRAMUSCULAR | Status: DC
  Administered 2019-06-16 – 2019-06-17 (×4): 60 mg via INTRAVENOUS
  Filled 2019-06-16 (×4): qty 2

## 2019-06-16 MED ORDER — IPRATROPIUM-ALBUTEROL 0.5-2.5 (3) MG/3ML IN SOLN: 3.0000 mL | RESPIRATORY_TRACT | Status: DC | PRN

## 2019-06-16 MED ORDER — POLYETHYLENE GLYCOL 3350 17 G PO PACK
17.0000 g | PACK | Freq: Every day | ORAL | Status: DC
  Administered 2019-06-16 – 2019-06-17 (×2): 17 g via ORAL
  Filled 2019-06-16 (×2): qty 1

## 2019-06-16 MED ORDER — SIMVASTATIN 20 MG PO TABS
20.0000 mg | ORAL_TABLET | Freq: Every day | ORAL | Status: DC
  Administered 2019-06-16: 20 mg via ORAL
  Filled 2019-06-16: qty 1

## 2019-06-16 MED ORDER — SERTRALINE HCL 100 MG PO TABS
100.0000 mg | ORAL_TABLET | Freq: Every day | ORAL | Status: DC
  Administered 2019-06-16 – 2019-06-17 (×2): 100 mg via ORAL
  Filled 2019-06-16 (×2): qty 1

## 2019-06-16 MED ORDER — DABIGATRAN ETEXILATE MESYLATE 150 MG PO CAPS: 150.0000 mg | ORAL_CAPSULE | Freq: Two times a day (BID) | ORAL | Status: DC

## 2019-06-16 MED ORDER — MOMETASONE FURO-FORMOTEROL FUM 200-5 MCG/ACT IN AERO
2.0000 | INHALATION_SPRAY | Freq: Two times a day (BID) | RESPIRATORY_TRACT | Status: DC
  Administered 2019-06-16 – 2019-06-17 (×3): 2 via RESPIRATORY_TRACT
  Filled 2019-06-16: qty 8.8

## 2019-06-16 MED ORDER — UMECLIDINIUM BROMIDE 62.5 MCG/INH IN AEPB
1.0000 | INHALATION_SPRAY | Freq: Every day | RESPIRATORY_TRACT | Status: DC
  Administered 2019-06-16 – 2019-06-17 (×2): 1 via RESPIRATORY_TRACT
  Filled 2019-06-16: qty 7

## 2019-06-16 MED ORDER — AZITHROMYCIN 500 MG PO TABS
500.0000 mg | ORAL_TABLET | Freq: Every day | ORAL | Status: DC
  Administered 2019-06-16 – 2019-06-17 (×2): 500 mg via ORAL
  Filled 2019-06-16: qty 2
  Filled 2019-06-16: qty 1

## 2019-06-16 MED ORDER — ACETAMINOPHEN 650 MG RE SUPP: 650.0000 mg | Freq: Four times a day (QID) | RECTAL | Status: DC | PRN

## 2019-06-16 MED ORDER — VITAMIN D 25 MCG (1000 UNIT) PO TABS
2000.0000 [IU] | ORAL_TABLET | Freq: Every day | ORAL | Status: DC
  Administered 2019-06-16 – 2019-06-17 (×2): 2000 [IU] via ORAL
  Filled 2019-06-16 (×2): qty 2

## 2019-06-16 MED ORDER — ACETAMINOPHEN 325 MG PO TABS: 650.0000 mg | ORAL_TABLET | Freq: Four times a day (QID) | ORAL | Status: DC | PRN

## 2019-06-16 NOTE — Plan of Care (Signed)
  Problem: Education: Goal: Knowledge of disease or condition will improve Outcome: Progressing Goal: Knowledge of the prescribed therapeutic regimen will improve Outcome: Progressing Goal: Individualized Educational Video(s) Outcome: Progressing   Problem: Activity: Goal: Ability to tolerate increased activity will improve Outcome: Progressing Goal: Will verbalize the importance of balancing activity with adequate rest periods Outcome: Progressing   Problem: Respiratory: Goal: Ability to maintain a clear airway will improve Outcome: Progressing Goal: Levels of oxygenation will improve Outcome: Progressing Goal: Ability to maintain adequate ventilation will improve Outcome: Progressing   Problem: Education: Goal: Ability to demonstrate management of disease process will improve Outcome: Progressing Goal: Ability to verbalize understanding of medication therapies will improve Outcome: Progressing Goal: Individualized Educational Video(s) Outcome: Progressing   Problem: Activity: Goal: Capacity to carry out activities will improve Outcome: Progressing   Problem: Cardiac: Goal: Ability to achieve and maintain adequate cardiopulmonary perfusion will improve Outcome: Progressing   

## 2019-06-16 NOTE — ED Notes (Signed)
Lunch Tray Ordered @ 1042. 

## 2019-06-16 NOTE — Plan of Care (Signed)
  Problem: Education: Goal: Knowledge of disease or condition will improve Outcome: Progressing Goal: Knowledge of the prescribed therapeutic regimen will improve Outcome: Progressing Goal: Individualized Educational Video(s) Outcome: Progressing   Problem: Activity: Goal: Ability to tolerate increased activity will improve Outcome: Progressing Goal: Will verbalize the importance of balancing activity with adequate rest periods Outcome: Progressing   Problem: Respiratory: Goal: Ability to maintain a clear airway will improve Outcome: Progressing Goal: Levels of oxygenation will improve Outcome: Progressing Goal: Ability to maintain adequate ventilation will improve Outcome: Progressing   Problem: Education: Goal: Ability to demonstrate management of disease process will improve 06/16/2019 1825 by Saunders Revel, RN Outcome: Progressing 06/16/2019 1824 by Saunders Revel, RN Outcome: Progressing Goal: Ability to verbalize understanding of medication therapies will improve 06/16/2019 1825 by Saunders Revel, RN Outcome: Progressing 06/16/2019 1824 by Saunders Revel, RN Outcome: Progressing Goal: Individualized Educational Video(s) 06/16/2019 1825 by Saunders Revel, RN Outcome: Progressing 06/16/2019 1824 by Saunders Revel, RN Outcome: Progressing   Problem: Activity: Goal: Capacity to carry out activities will improve 06/16/2019 1825 by Saunders Revel, RN Outcome: Progressing 06/16/2019 1824 by Saunders Revel, RN Outcome: Progressing   Problem: Cardiac: Goal: Ability to achieve and maintain adequate cardiopulmonary perfusion will improve 06/16/2019 1825 by Saunders Revel, RN Outcome: Progressing 06/16/2019 1824 by Saunders Revel, RN Outcome: Progressing

## 2019-06-16 NOTE — Plan of Care (Signed)
  Problem: Education: Goal: Knowledge of disease or condition will improve Outcome: Progressing Goal: Knowledge of the prescribed therapeutic regimen will improve Outcome: Progressing Goal: Individualized Educational Video(s) Outcome: Progressing   Problem: Activity: Goal: Ability to tolerate increased activity will improve Outcome: Progressing Goal: Will verbalize the importance of balancing activity with adequate rest periods Outcome: Progressing   Problem: Respiratory: Goal: Ability to maintain a clear airway will improve Outcome: Progressing Goal: Levels of oxygenation will improve Outcome: Progressing Goal: Ability to maintain adequate ventilation will improve Outcome: Progressing   Problem: Education: Goal: Ability to demonstrate management of disease process will improve 06/16/2019 1825 by Kairen Hallinan G, RN Outcome: Progressing 06/16/2019 1824 by Kynzee Devinney G, RN Outcome: Progressing Goal: Ability to verbalize understanding of medication therapies will improve 06/16/2019 1825 by  Antolin G, RN Outcome: Progressing 06/16/2019 1824 by Chieko Neises G, RN Outcome: Progressing Goal: Individualized Educational Video(s) 06/16/2019 1825 by Adilee Lemme G, RN Outcome: Progressing 06/16/2019 1824 by Breleigh Carpino G, RN Outcome: Progressing   Problem: Activity: Goal: Capacity to carry out activities will improve 06/16/2019 1825 by Orlie Cundari G, RN Outcome: Progressing 06/16/2019 1824 by Samantha Olivera G, RN Outcome: Progressing   Problem: Cardiac: Goal: Ability to achieve and maintain adequate cardiopulmonary perfusion will improve 06/16/2019 1825 by Janit Cutter G, RN Outcome: Progressing 06/16/2019 1824 by Kree Armato G, RN Outcome: Progressing   

## 2019-06-16 NOTE — Evaluation (Signed)
Physical Therapy Evaluation Patient Details Name: Johnny Rodriguez MRN: [id number] DOB: [birth year] Today's Date: 06/16/2019   History of Present Illness  Pt is 69 year old male with history of CAD, COPD, chronic hypoxic respite failure on 3 L home oxygen, ongoing tobacco abuse, hypertension, paroxysmal A. fib presented with shortness of breath and chest tightness.  He was admitted for COPD exacerbation along with A. fib with RVR and started on steroids as well as Cardizem drip (Not on drip at time of PT eval)  Clinical Impression  Pt admitted with above diagnosis. Pt presenting with mild unsteadiness and decreased endurance during therapy.  He required min guard for safety and min cues for transfer/breathing techniques.  Pt was on 3 LPM o2 (this is his home O2 level) with sats dropping to 89% with walking.  Pt largely limited due to respiratory status.   Pt currently with functional limitations due to the deficits listed below (see PT Problem List). Pt will benefit from skilled PT to increase their independence and safety with mobility to allow discharge to the venue listed below.       Follow Up Recommendations Home health PT    Equipment Recommendations  None recommended by PT(pt has DME)    Recommendations for Other Services       Precautions / Restrictions Precautions Precautions: None      Mobility  Bed Mobility Overal bed mobility: Needs Assistance Bed Mobility: Supine to Sit     Supine to sit: Supervision;HOB elevated        Transfers Overall transfer level: Needs assistance Equipment used: Straight cane Transfers: Sit to/from Stand Sit to Stand: Min guard         General transfer comment: min guard for safety - pt was mildly unsteady with first stand and returned to sitting; improved on second stand  Ambulation/Gait Ambulation/Gait assistance: Min guard Gait Distance (Feet): 80 Feet Assistive device: Straight cane Gait Pattern/deviations: Step-through  pattern;Decreased stride length Gait velocity: decreased   General Gait Details: mild unsteadiness but no LOB;  On 3 LPM O2 with DOE of 3/4 and required 1 standing rest break.  Stairs            Wheelchair Mobility    Modified Rankin (Stroke Patients Only)       Balance Overall balance assessment: Needs assistance Sitting-balance support: No upper extremity supported Sitting balance-Leahy Scale: Normal     Standing balance support: Single extremity supported Standing balance-Leahy Scale: Good                               Pertinent Vitals/Pain Pain Assessment: 0-10 Pain Score: 7  Pain Location: stomach and L shoulder Pain Descriptors / Indicators: Discomfort Pain Intervention(s): Limited activity within patient's tolerance;Premedicated before session;Repositioned;Relaxation    Home Living Family/patient expects to be discharged to:: Private residence Living Arrangements: Alone Available Help at Discharge: Other (Comment)(none) Type of Home: (motel) Home Access: Level entry     Home Layout: One level Home Equipment: Cane - single point;Walker - 2 wheels      Prior Function Level of Independence: Independent with assistive device(s)         Comments: Pt reports does not drive but independent with ADLs, IADLs, grocery shopping, etc.  Typically, uses a cane.  Pt reports can ambulate in store but will use motorized cart if available.  Wears 3 LPM O2 at all times.  Reports was receiving HHPT  Hand Dominance        Extremity/Trunk Assessment   Upper Extremity Assessment Upper Extremity Assessment: LUE deficits/detail;RUE deficits/detail RUE Deficits / Details: WFL LUE Deficits / Details: WFL but reports fracture L shoulder last summer and was unable to have surgery due to unpaid bills.  L shoulder still with at least 100 degrees elevation and 4/5 strength.    Lower Extremity Assessment Lower Extremity Assessment: Overall WFL for tasks  assessed    Cervical / Trunk Assessment Cervical / Trunk Assessment: Normal  Communication   Communication: No difficulties  Cognition Arousal/Alertness: Awake/alert Behavior During Therapy: WFL for tasks assessed/performed Overall Cognitive Status: Within Functional Limits for tasks assessed                                        General Comments General comments (skin integrity, edema, etc.): Pt on 3 LPM O2 with O2 sats 93% rest, 89% walk, and recovers in <15 sec.  HR maintained 80's bpm during therapy - pt not on Cardizem drip.    Exercises     Assessment/Plan    PT Assessment Patient needs continued PT services  PT Problem List Decreased strength;Decreased mobility;Decreased range of motion;Decreased knowledge of precautions;Decreased activity tolerance;Cardiopulmonary status limiting activity;Decreased balance;Decreased knowledge of use of DME       PT Treatment Interventions DME instruction;Therapeutic activities;Gait training;Therapeutic exercise;Patient/family education;Stair training;Balance training;Functional mobility training    PT Goals (Current goals can be found in the Care Plan section)  Acute Rehab PT Goals Patient Stated Goal: return home - hopefully tomorrow PT Goal Formulation: With patient Time For Goal Achievement: 06/30/19 Potential to Achieve Goals: Good    Frequency Min 3X/week   Barriers to discharge Decreased caregiver support      Co-evaluation               AM-PAC PT "6 Clicks" Mobility  Outcome Measure Help needed turning from your back to your side while in a flat bed without using bedrails?: None Help needed moving from lying on your back to sitting on the side of a flat bed without using bedrails?: None Help needed moving to and from a bed to a chair (including a wheelchair)?: None Help needed standing up from a chair using your arms (e.g., wheelchair or bedside chair)?: None Help needed to walk in hospital room?:  None Help needed climbing 3-5 steps with a railing? : A Little 6 Click Score: 23    End of Session Equipment Utilized During Treatment: Gait belt;Oxygen Activity Tolerance: Patient tolerated treatment well Patient left: in bed;with call bell/phone within reach(sitting EOB to eat) Nurse Communication: Mobility status PT Visit Diagnosis: Unsteadiness on feet (R26.81);Other abnormalities of gait and mobility (R26.89)    Time: 5456-2563 PT Time Calculation (min) (ACUTE ONLY): 25 min   Charges:   PT Evaluation $PT Eval Low Complexity: 1 Low PT Treatments $Gait Training: 8-22 mins        Maggie Font, PT Acute Rehab Services Pager [phone] [facility] 06/16/2019, 5:37 PM

## 2019-06-16 NOTE — Progress Notes (Signed)
Pt was asking about his Lasix 40 mg that he takes daily at home, please address, thanks Lavonda Jumbo RN.

## 2019-06-16 NOTE — Progress Notes (Signed)
  Echocardiogram 2D Echocardiogram has been performed.  Janalyn Harder 06/16/2019, 3:32 PM

## 2019-06-16 NOTE — Progress Notes (Signed)
Patient ID: Johnny Rodriguez, male   DOB: Jul 07, 1950, 68 y.o.   MRN: [id number]  PROGRESS NOTE    Johnny Rodriguez  KDT:[id number] DOB: [birth year] DOA: 06/15/2019 PCP: Gwenyth Bender, MD   Brief Narrative:  69 year old male with history of CAD, COPD, chronic hypoxic respite failure on 3 L home oxygen, ongoing tobacco abuse, hypertension, paroxysmal A. fib presented with shortness of breath and chest tightness.  He was admitted for COPD exacerbation along with A. fib with RVR and started on steroids as well as Cardizem drip.  Assessment & Plan:   COPD exacerbation Acute on chronic hypoxic respiratory failure Tobacco abuse ongoing -Patient uses 3 L oxygen at home via nasal cannula.  Required 10 L via nasal cannula on presentation.  Chest x-ray was negative for infiltrates.  COVID-19 testing negative. - will continue Solu-Medrol 40 mg IV every 8 hours.  Continue nebs and Dulera.  Counseled about tobacco cessation.  Incentive spirometry. -Wean off oxygen as able.  Patient feels slightly better.  Paroxysmal A. fib with RVR -Currently on Cardizem drip.  Currently rate controlled.  Wean off Cardizem drip.  Continue metoprolol.  Resume home Cardizem.  Continue Pradaxa.  Outpatient follow-up with cardiology  Chronic diastolic heart failure -Does not look to be volume overloaded.  Strict input and output.  Daily weights.  Fluid restriction.  Losartan on hold.  Continue metoprolol.  Outpatient follow-up with cardiology.  Hypertension -Monitor blood pressure.  Antihypertensive plan as above  Hyperlipidemia -Continue simvastatin  Depression -Continue sertraline   DVT prophylaxis: Pradaxa Code Status: DNR Family Communication: Patient at bedside Disposition Plan: Status is: Inpatient  Remains inpatient appropriate because: Still requiring significant amount of oxygen with wheezing on physical exam requiring IV steroids.  Currently still on IV Cardizem drip.  Probable discharge in 1 to 2 days if  respiratory status improves.   Dispo: The patient is from: Home              Anticipated d/c is to: Home              Anticipated d/c date is: 1 day              Patient currently is not medically stable to d/c.   Consultants: None  Procedures: None  Antimicrobials: Zithromax   Subjective: Patient seen and examined at bedside.  He feels slightly better and his breathing is slightly better.  Still short of breath with minimal exertion.  No current chest pain, fever or vomiting.  Objective: Vitals:   06/16/19 0915 06/16/19 0930 06/16/19 0945 06/16/19 1000  BP: 118/84 118/82 109/74 (!) 122/92  Pulse: (!) 54 63 80 (!) 54  Resp: (!) 21 (!) 25 (!) 22 20  Temp:      TempSrc:      SpO2: 95% 94% 96% 94%  Weight:      Height:       No intake or output data in the 24 hours ending 06/16/19 1037 Filed Weights   06/15/19 1405  Weight: 68 kg    Examination:  General exam: Appears calm and comfortable.  Chronically ill looking. Respiratory system: Bilateral decreased breath sounds at bases with scattered wheezing and some crackles.  Intermittently tachypneic Cardiovascular system: S1 & S2 heard; intermittent bradycardia  gastrointestinal system: Abdomen is nondistended, soft and nontender. Normal bowel sounds heard. Extremities: No cyanosis, clubbing, edema  Central nervous system: Alert and oriented. No focal neurological deficits. Moving extremities Skin: No rashes, lesions or ulcers Psychiatry: Judgement  and insight appear normal. Mood & affect appropriate.     Data Reviewed: I have personally reviewed following labs and imaging studies  CBC: Recent Labs  Lab 06/15/19 1533 06/16/19 0349  WBC 7.0 7.6  NEUTROABS 5.4  --   HGB 13.9 14.1  HCT 43.3 45.0  MCV 95.4 96.6  PLT 217 204   Basic Metabolic Panel: Recent Labs  Lab 06/15/19 1533 06/16/19 0349  NA 138 140  K 4.3 5.4*  CL 96* 95*  CO2 33* 33*  GLUCOSE 103* 216*  BUN 12 16  CREATININE 1.13 1.34*  CALCIUM  9.3 9.7   GFR: Estimated Creatinine Clearance: 51.5 mL/min (A) (by C-G formula based on SCr of 1.34 mg/dL (H)). Liver Function Tests: Recent Labs  Lab 06/15/19 1533  AST 20  ALT 17  ALKPHOS 62  BILITOT 1.0  PROT 6.7  ALBUMIN 3.9   No results for input(s): LIPASE, AMYLASE in the last 168 hours. No results for input(s): AMMONIA in the last 168 hours. Coagulation Profile: Recent Labs  Lab 06/15/19 1533  INR 1.0   Cardiac Enzymes: No results for input(s): CKTOTAL, CKMB, CKMBINDEX, TROPONINI in the last 168 hours. BNP (last 3 results) No results for input(s): PROBNP in the last 8760 hours. HbA1C: No results for input(s): HGBA1C in the last 72 hours. CBG: No results for input(s): GLUCAP in the last 168 hours. Lipid Profile: No results for input(s): CHOL, HDL, LDLCALC, TRIG, CHOLHDL, LDLDIRECT in the last 72 hours. Thyroid Function Tests: No results for input(s): TSH, T4TOTAL, FREET4, T3FREE, THYROIDAB in the last 72 hours. Anemia Panel: No results for input(s): VITAMINB12, FOLATE, FERRITIN, TIBC, IRON, RETICCTPCT in the last 72 hours. Sepsis Labs: No results for input(s): PROCALCITON, LATICACIDVEN in the last 168 hours.  Recent Results (from the past 240 hour(s))  Respiratory Panel by RT PCR (Flu A&B, Covid) - Nasopharyngeal Swab     Status: None   Collection Time: 06/15/19  8:31 PM   Specimen: Nasopharyngeal Swab  Result Value Ref Range Status   SARS Coronavirus 2 by RT PCR NEGATIVE NEGATIVE Final    Comment: (NOTE) SARS-CoV-2 target nucleic acids are NOT DETECTED. The SARS-CoV-2 RNA is generally detectable in upper respiratoy specimens during the acute phase of infection. The lowest concentration of SARS-CoV-2 viral copies this assay can detect is 131 copies/mL. A negative result does not preclude SARS-Cov-2 infection and should not be used as the sole basis for treatment or other patient management decisions. A negative result may occur with  improper specimen  collection/handling, submission of specimen other than nasopharyngeal swab, presence of viral mutation(s) within the areas targeted by this assay, and inadequate number of viral copies (<131 copies/mL). A negative result must be combined with clinical observations, patient history, and epidemiological information. The expected result is Negative. Fact Sheet for Patients:  https://www.moore.com/ Fact Sheet for Healthcare Providers:  https://www.young.biz/ This test is not yet ap proved or cleared by the [place] FDA and  has been authorized for detection and/or diagnosis of SARS-CoV-2 by FDA under an Emergency Use Authorization (EUA). This EUA will remain  in effect (meaning this test can be used) for the duration of the COVID-19 declaration under Section 564(b)(1) of the Act, 21 U.S.C. section 360bbb-3(b)(1), unless the authorization is terminated or revoked sooner.    Influenza A by PCR NEGATIVE NEGATIVE Final   Influenza B by PCR NEGATIVE NEGATIVE Final    Comment: (NOTE) The Xpert Xpress SARS-CoV-2/FLU/RSV assay is intended as an aid in  the diagnosis of influenza from Nasopharyngeal swab specimens and  should not be used as a sole basis for treatment. Nasal washings and  aspirates are unacceptable for Xpert Xpress SARS-CoV-2/FLU/RSV  testing. Fact Sheet for Patients: PinkCheek.be Fact Sheet for Healthcare Providers: GravelBags.it This test is not yet approved or cleared by the [place] FDA and  has been authorized for detection and/or diagnosis of SARS-CoV-2 by  FDA under an Emergency Use Authorization (EUA). This EUA will remain  in effect (meaning this test can be used) for the duration of the  Covid-19 declaration under Section 564(b)(1) of the Act, 21  U.S.C. section 360bbb-3(b)(1), unless the authorization is  terminated or revoked. Performed at [facility] [address], [place], [place] [address]          Radiology Studies: CT ABDOMEN PELVIS W CONTRAST  Result Date: 06/15/2019 CLINICAL DATA:  Shortness of breath, worsening chest pain for 7 days EXAM: CT ABDOMEN AND PELVIS WITH CONTRAST TECHNIQUE: Multidetector CT imaging of the abdomen and pelvis was performed using the standard protocol following bolus administration of intravenous contrast. CONTRAST:  169mL OMNIPAQUE IOHEXOL 300 MG/ML  SOLN COMPARISON:  03/11/2019 FINDINGS: Lower chest: No acute abnormality. Hepatobiliary: No focal liver abnormality is seen. No gallstones, gallbladder wall thickening, or biliary dilatation. Pancreas: Unremarkable. No pancreatic ductal dilatation or surrounding inflammatory changes. Spleen: Normal in size without focal abnormality. Adrenals/Urinary Tract: Adrenal glands are unremarkable. Kidneys are normal, without renal calculi, focal lesion, or hydronephrosis. Bladder is unremarkable. Stomach/Bowel: No bowel obstruction or ileus. Normal appendix right lower quadrant. No bowel wall thickening or inflammatory changes. Vascular/Lymphatic: Aortic atherosclerosis. No enlarged abdominal or pelvic lymph nodes. Reproductive: Prostate is unremarkable. Other: No abdominal wall hernia or abnormality. No abdominopelvic ascites. Musculoskeletal: No acute or destructive bony lesions. Reconstructed images demonstrate no additional findings. IMPRESSION: 1. No acute intra-abdominal or intrapelvic process. 2.  Aortic Atherosclerosis (ICD10-I70.0). Electronically Signed   By: Randa Ngo M.D.   On: 06/15/2019 20:12   DG Chest Port 1 View  Result Date: 06/15/2019 CLINICAL DATA:  Shortness of breath EXAM: PORTABLE CHEST 1 VIEW COMPARISON:  Chest x-rays dated 03/25/2019 and 09/13/2003. FINDINGS: Heart size and mediastinal contours are within normal limits. Aortic atherosclerosis. Lungs are clear. No pleural effusion or pneumothorax is seen. Osseous structures about the chest are  unremarkable. IMPRESSION: 1. No active disease. No evidence of pneumonia or pulmonary edema. 2. Aortic atherosclerosis. Electronically Signed   By: Franki Cabot M.D.   On: 06/15/2019 15:26        Scheduled Meds: . azithromycin  500 mg Oral Daily  . cholecalciferol  2,000 Units Oral Daily  . dabigatran  150 mg Oral BID  . folic acid  1 mg Oral Daily  . methylPREDNISolone (SOLU-MEDROL) injection  60 mg Intravenous Q8H  . metoprolol succinate  50 mg Oral Daily  . mometasone-formoterol  2 puff Inhalation BID  . multivitamin with minerals  1 tablet Oral Daily  . polyethylene glycol  17 g Oral Daily  . senna  1-2 tablet Oral Daily  . sertraline  100 mg Oral Daily  . simvastatin  20 mg Oral QHS  . thiamine  100 mg Oral Daily  . thiamine  100 mg Oral Daily  . umeclidinium bromide  1 puff Inhalation Daily   Continuous Infusions: . diltiazem (CARDIZEM) infusion 7.5 mg/hr (06/16/19 0737)          Aline August, MD [facility] 06/16/2019, 10:37 AM

## 2019-06-16 NOTE — Progress Notes (Signed)
Patient arrived to unit vs stable no distress noted tele set up with CCMD.

## 2019-06-16 NOTE — ED Notes (Signed)
Admitting provider at the bedside.

## 2019-06-16 NOTE — ED Notes (Signed)
No chest pain

## 2019-06-16 NOTE — Plan of Care (Signed)
  Problem: Education: Goal: Knowledge of disease or condition will improve 06/16/2019 1827 by Saunders Revel, RN Outcome: Progressing 06/16/2019 1824 by Saunders Revel, RN Outcome: Progressing Goal: Knowledge of the prescribed therapeutic regimen will improve 06/16/2019 1827 by Saunders Revel, RN Outcome: Progressing 06/16/2019 1824 by Saunders Revel, RN Outcome: Progressing Goal: Individualized Educational Video(s) 06/16/2019 1827 by Saunders Revel, RN Outcome: Progressing 06/16/2019 1824 by Saunders Revel, RN Outcome: Progressing   Problem: Activity: Goal: Ability to tolerate increased activity will improve 06/16/2019 1827 by Saunders Revel, RN Outcome: Progressing 06/16/2019 1824 by Saunders Revel, RN Outcome: Progressing Goal: Will verbalize the importance of balancing activity with adequate rest periods 06/16/2019 1827 by Saunders Revel, RN Outcome: Progressing 06/16/2019 1824 by Saunders Revel, RN Outcome: Progressing   Problem: Respiratory: Goal: Ability to maintain a clear airway will improve 06/16/2019 1827 by Saunders Revel, RN Outcome: Progressing 06/16/2019 1824 by Saunders Revel, RN Outcome: Progressing Goal: Levels of oxygenation will improve 06/16/2019 1827 by Saunders Revel, RN Outcome: Progressing 06/16/2019 1824 by Saunders Revel, RN Outcome: Progressing Goal: Ability to maintain adequate ventilation will improve 06/16/2019 1827 by Saunders Revel, RN Outcome: Progressing 06/16/2019 1824 by Saunders Revel, RN Outcome: Progressing   Problem: Education: Goal: Ability to demonstrate management of disease process will improve 06/16/2019 1827 by Saunders Revel, RN Outcome: Progressing 06/16/2019 1825 by Saunders Revel, RN Outcome: Progressing 06/16/2019 1824 by Saunders Revel, RN Outcome: Progressing Goal: Ability to verbalize understanding of medication therapies will improve 06/16/2019 1827 by Saunders Revel,  RN Outcome: Progressing 06/16/2019 1825 by Saunders Revel, RN Outcome: Progressing 06/16/2019 1824 by Saunders Revel, RN Outcome: Progressing Goal: Individualized Educational Video(s) 06/16/2019 1827 by Saunders Revel, RN Outcome: Progressing 06/16/2019 1825 by Saunders Revel, RN Outcome: Progressing 06/16/2019 1824 by Saunders Revel, RN Outcome: Progressing   Problem: Activity: Goal: Capacity to carry out activities will improve 06/16/2019 1827 by Saunders Revel, RN Outcome: Progressing 06/16/2019 1825 by Saunders Revel, RN Outcome: Progressing 06/16/2019 1824 by Saunders Revel, RN Outcome: Progressing   Problem: Cardiac: Goal: Ability to achieve and maintain adequate cardiopulmonary perfusion will improve 06/16/2019 1827 by Saunders Revel, RN Outcome: Progressing 06/16/2019 1825 by Saunders Revel, RN Outcome: Progressing 06/16/2019 1824 by Saunders Revel, RN Outcome: Progressing

## 2019-06-17 ENCOUNTER — Encounter (HOSPITAL_COMMUNITY): Payer: Self-pay | Admitting: Internal Medicine

## 2019-06-17 DIAGNOSIS — I480 Paroxysmal atrial fibrillation: Secondary | ICD-10-CM | POA: Diagnosis not present

## 2019-06-17 DIAGNOSIS — I5032 Chronic diastolic (congestive) heart failure: Secondary | ICD-10-CM | POA: Diagnosis not present

## 2019-06-17 DIAGNOSIS — J441 Chronic obstructive pulmonary disease with (acute) exacerbation: Secondary | ICD-10-CM | POA: Diagnosis not present

## 2019-06-17 DIAGNOSIS — J9621 Acute and chronic respiratory failure with hypoxia: Secondary | ICD-10-CM | POA: Diagnosis not present

## 2019-06-17 LAB — BASIC METABOLIC PANEL
Anion gap: 9 (ref 5–15)
BUN: 24 mg/dL — ABNORMAL HIGH (ref 8–23)
CO2: 30 mmol/L (ref 22–32)
Calcium: 9 mg/dL (ref 8.9–10.3)
Chloride: 98 mmol/L (ref 98–111)
Creatinine, Ser: 1.27 mg/dL — ABNORMAL HIGH (ref 0.61–1.24)
GFR calc Af Amer: >60 mL/min (ref >60)
GFR calc non Af Amer: 58 mL/min — ABNORMAL LOW (ref >60)
Glucose, Bld: 168 mg/dL — ABNORMAL HIGH (ref 70–99)
Potassium: 4.7 mmol/L (ref 3.5–5.1)
Sodium: 137 mmol/L (ref 135–145)

## 2019-06-17 LAB — CBC WITH DIFFERENTIAL/PLATELET
Abs Immature Granulocytes: 0.21 10*3/uL — ABNORMAL HIGH (ref 0.00–0.07)
Basophils Absolute: 0 10*3/uL (ref 0.0–0.1)
Basophils Relative: 0 %
Eosinophils Absolute: 0 10*3/uL (ref 0.0–0.5)
Eosinophils Relative: 0 %
HCT: 39.1 % (ref 39.0–52.0)
Hemoglobin: 12.9 g/dL — ABNORMAL LOW (ref 13.0–17.0)
Immature Granulocytes: 1 %
Lymphocytes Relative: 2 %
Lymphs Abs: 0.4 10*3/uL — ABNORMAL LOW (ref 0.7–4.0)
MCH: 31.1 pg (ref 26.0–34.0)
MCHC: 33 g/dL (ref 30.0–36.0)
MCV: 94.2 fL (ref 80.0–100.0)
Monocytes Absolute: 0.4 10*3/uL (ref 0.1–1.0)
Monocytes Relative: 2 %
Neutro Abs: 16.8 10*3/uL — ABNORMAL HIGH (ref 1.7–7.7)
Neutrophils Relative %: 95 %
Platelets: 230 10*3/uL (ref 150–400)
RBC: 4.15 MIL/uL — ABNORMAL LOW (ref 4.22–5.81)
RDW: 13.5 % (ref 11.5–15.5)
WBC: 17.9 10*3/uL — ABNORMAL HIGH (ref 4.0–10.5)
nRBC: 0 % (ref 0.0–0.2)

## 2019-06-17 LAB — MAGNESIUM: Magnesium: 2.1 mg/dL (ref 1.7–2.4)

## 2019-06-17 MED ORDER — PREDNISONE 20 MG PO TABS: 40.0000 mg | ORAL_TABLET | Freq: Every day | ORAL | 0 refills | Status: AC

## 2019-06-17 NOTE — TOC Transition Note (Signed)
Transition of Care [facility]) - CM/SW Discharge Note   Patient Details  Name: Johnny Rodriguez MRN: [id number] Date of Birth: 02-Oct-1950  Transition of [facility]) CM/SW Contact:  Leone Haven, RN Phone Number: 06/17/2019, 9:15 AM   Clinical Narrative:    Patient is for dc home today, he is [facility] with [facility], will add HHPT, it may take a couple days before HHPT is started.  He has no other needs.   Final next level of care: Home w Home Health Services Barriers to Discharge: No Barriers Identified   Patient Goals and CMS Choice Patient states their goals for this hospitalization and ongoing recovery are:: get better CMS Medicare.gov Compare Post Acute Care list provided to:: Patient Choice offered to / list presented to : Patient  Discharge Placement                       Discharge Plan and Services                  DME Agency: NA       HH Arranged: RN, Disease Management, PT HH Agency: [facility] Date HH Agency Contacted: 06/17/19 Time HH Agency Contacted: [phone] Representative spoke with at [facility] Agency: Tiffany  Social Determinants of Health (SDOH) Interventions     Readmission Risk Interventions No flowsheet data found.

## 2019-06-17 NOTE — Progress Notes (Signed)
Discharge education and medication education given to patient with teach back. Education on increasing activity slowly, low sodium heart healthy diet, and when to call MD given.  All questions and concerns answered. Peripheral IV and telemetry leads removed. All patient belongings given to patient. Patient transported to main entrance by nurse tech via wheelchair.  

## 2019-06-17 NOTE — Discharge Summary (Signed)
Physician Discharge Summary  Johnny Rodriguez OZH:[id number] DOB: [birth year] DOA: 06/15/2019  PCP: Gwenyth Bender, MD  Admit date: 06/15/2019 Discharge date: 06/17/2019  Admitted From: Home Disposition: Home  Recommendations for Outpatient Follow-up:  1. Follow up with PCP in 1 week with repeat CBC/BMP 2. Outpatient follow-up with pulmonary and cardiology 3. Follow up in ED if symptoms worsen or new appear   Home Health: Home with PT/RN Equipment/Devices: Continue nasal cannula oxygen.  Patient is on 3 L oxygen at home at baseline.  Discharge Condition: Stable CODE STATUS: Full Diet recommendation: Heart healthy  Brief/Interim Summary: 69 year old male with history of CAD, COPD, chronic hypoxic respite failure on 3 L home oxygen, ongoing tobacco abuse, hypertension, paroxysmal A. fib presented with shortness of breath and chest tightness.  He was admitted for COPD exacerbation along with A. fib with RVR and started on steroids as well as Cardizem drip.  During hospitalization, his condition improved.  His respiratory status improved.  He was switched to oral Cardizem once his heart rate normalized.  He feels much better and wants to go home.  He was discharged on oral prednisone with outpatient follow-up with PCP and pulmonary.  Discharge Diagnoses:   COPD exacerbation Acute on chronic hypoxic respiratory failure Tobacco abuse ongoing -Patient uses 3 L oxygen at home via nasal cannula.  Required 10 L via nasal cannula on presentation.  Chest x-ray was negative for infiltrates.  COVID-19 testing negative. - Treated with IV Solu-Medrol, nebs and Dulera. -Respiratory status is back to baseline at 3 L/min.  Patient feels much better and wants to go home. -Discharged on oral prednisone 40 mg daily for 7 days.  Continue home regimen of inhalers.  Counseled regarding tobacco cessation.  Outpatient follow-up with PCP and pulmonary.  Paroxysmal A. fib with RVR -Treated with Cardizem drip.  Currently  rate controlled.    Subsequently switched to oral Cardizem and metoprolol.  Continue Pradaxa.  Outpatient follow-up with cardiology  Chronic diastolic heart failure -Does not look to be volume overloaded.  Resume home regimen of metoprolol, losartan and Lasix.  Outpatient follow-up with cardiology. -2 g sodium diet with fluid restriction.  Hypertension -Monitor blood pressure.  Antihypertensive plan as above  Hyperlipidemia -Continue simvastatin  Depression -Continue sertraline  Discharge Instructions  Discharge Instructions    Ambulatory referral to Cardiology   Complete by: As directed    Afib followup   Ambulatory referral to Pulmonology   Complete by: As directed    Reason for referral: Asthma/COPD   Diet - low sodium heart healthy   Complete by: As directed    Increase activity slowly   Complete by: As directed      Allergies as of 06/17/2019   No Known Allergies     Medication List    STOP taking these medications   Tiadylt ER 240 MG 24 hr capsule Generic drug: diltiazem     TAKE these medications   albuterol 108 (90 Base) MCG/ACT inhaler Commonly known as: ProAir HFA Inhale 1-2 puffs into the lungs every 6 (six) hours as needed for wheezing or shortness of breath.   dabigatran 150 MG Caps capsule Commonly known as: PRADAXA Take 1 capsule (150 mg total) by mouth 2 (two) times daily. Medicaid [id number] O   diltiazem 240 MG 24 hr capsule Commonly known as: CARDIZEM CD Take 1 capsule (240 mg total) by mouth daily. Medicaid [id number] O   folic acid 1 MG tablet Commonly known as: FOLVITE Take 1 tablet (1 mg total)  by mouth daily.   furosemide 40 MG tablet Commonly known as: Lasix Take 1 tablet (40 mg total) by mouth daily. Medicaid [id number] O   losartan 25 MG tablet Commonly known as: Cozaar Take 1 tablet (25 mg total) by mouth daily. Medicaid [id number] O   metoprolol succinate 50 MG 24 hr tablet Commonly known as: TOPROL-XL Take 1 tablet (50 mg  total) by mouth daily. Take with or immediately following a meal. Medicaid [id number] O   mometasone-formoterol 200-5 MCG/ACT Aero Commonly known as: DULERA Inhale 2 puffs into the lungs 2 (two) times daily. Medicaid [id number] O What changed: additional instructions   multivitamin with minerals Tabs tablet Take 1 tablet by mouth daily.   OXYGEN Inhale 3 L into the lungs continuous.   polyethylene glycol 17 g packet Commonly known as: MIRALAX / GLYCOLAX Take 17 g by mouth daily.   senna 8.6 MG Tabs tablet Commonly known as: SENOKOT Take 1-2 tablets (8.6-17.2 mg total) by mouth daily.   sertraline 100 MG tablet Commonly known as: ZOLOFT Take 100 mg by mouth daily.   simvastatin 20 MG tablet Commonly known as: ZOCOR Take 20 mg by mouth at bedtime.   thiamine 100 MG tablet Take 1 tablet (100 mg total) by mouth daily.   tiotropium 18 MCG inhalation capsule Commonly known as: SPIRIVA Place 1 capsule (18 mcg total) into inhaler and inhale daily.   Vitamin D2 50 MCG (2000 UT) Tabs Take 2,000 Units by mouth daily.      Follow-up Information    Med[facility], [facility]. Go on 07/04/2019.   Specialty: Family Medicine Why: @9 :00am Contact information: [address] [place] [phone]          No Known Allergies  Consultations:  None   Procedures/Studies: CT ABDOMEN PELVIS W CONTRAST  Result Date: 06/15/2019 CLINICAL DATA:  Shortness of breath, worsening chest pain for 7 days EXAM: CT ABDOMEN AND PELVIS WITH CONTRAST TECHNIQUE: Multidetector CT imaging of the abdomen and pelvis was performed using the standard protocol following bolus administration of intravenous contrast. CONTRAST:  08/15/2019 OMNIPAQUE IOHEXOL 300 MG/ML  SOLN COMPARISON:  03/11/2019 FINDINGS: Lower chest: No acute abnormality. Hepatobiliary: No focal liver abnormality is seen. No gallstones, gallbladder wall thickening, or biliary dilatation. Pancreas: Unremarkable. No  pancreatic ductal dilatation or surrounding inflammatory changes. Spleen: Normal in size without focal abnormality. Adrenals/Urinary Tract: Adrenal glands are unremarkable. Kidneys are normal, without renal calculi, focal lesion, or hydronephrosis. Bladder is unremarkable. Stomach/Bowel: No bowel obstruction or ileus. Normal appendix right lower quadrant. No bowel wall thickening or inflammatory changes. Vascular/Lymphatic: Aortic atherosclerosis. No enlarged abdominal or pelvic lymph nodes. Reproductive: Prostate is unremarkable. Other: No abdominal wall hernia or abnormality. No abdominopelvic ascites. Musculoskeletal: No acute or destructive bony lesions. Reconstructed images demonstrate no additional findings. IMPRESSION: 1. No acute intra-abdominal or intrapelvic process. 2.  Aortic Atherosclerosis (ICD10-I70.0). Electronically Signed   By: 03/13/2019 M.D.   On: 06/15/2019 20:12   DG Chest Port 1 View  Result Date: 06/15/2019 CLINICAL DATA:  Shortness of breath EXAM: PORTABLE CHEST 1 VIEW COMPARISON:  Chest x-rays dated 03/25/2019 and 09/13/2003. FINDINGS: Heart size and mediastinal contours are within normal limits. Aortic atherosclerosis. Lungs are clear. No pleural effusion or pneumothorax is seen. Osseous structures about the chest are unremarkable. IMPRESSION: 1. No active disease. No evidence of pneumonia or pulmonary edema. 2. Aortic atherosclerosis. Electronically Signed   By: 11/13/2003 M.D.   On: 06/15/2019 15:26   ECHOCARDIOGRAM COMPLETE  Result Date:  06/16/2019    ECHOCARDIOGRAM REPORT   Patient Name:   DARRYLL RAJU Echeverria Date of Exam: 06/16/2019 Medical Rec #:  [id number]     Height:       71.0 in Accession #:    [id number]    Weight:       150.0 lb Date of Birth:  [birth year]     BSA:          1.866 m Patient Age:    69 years      BP:           120/71 mmHg Patient Gender: M             HR:           79 bpm. Exam Location:  Inpatient Procedure: 2D Echo, Color Doppler and Cardiac Doppler  Indications:    I48.91* Unspeicified atrial fibrillation  History:        Patient has prior history of Echocardiogram examinations, most                 recent 09/15/2006. Cardiomyopathy, CAD, COPD,                 Signs/Symptoms:Syncope; Risk Factors:Hypertension and Current                 Smoker.  Sonographer:    Sheralyn Boatman RDCS Referring Phys: [id number] ARAVIND CHANDRA  Sonographer Comments: Technically difficult study due to poor echo windows. IMPRESSIONS  1. Left ventricular ejection fraction, by estimation, is 45 to 50%. The left ventricle has mildly decreased function. The left ventricle demonstrates global hypokinesis. There is mild left ventricular hypertrophy. Left ventricular diastolic parameters are indeterminate.  2. Right ventricular systolic function is normal. The right ventricular size is normal. There is mildly elevated pulmonary artery systolic pressure. The estimated right ventricular systolic pressure is 36.7 mmHg.  3. Right atrial size was mildly dilated.  4. The mitral valve is normal in structure. Trivial mitral valve regurgitation. No evidence of mitral stenosis.  5. The aortic valve is tricuspid. Aortic valve regurgitation is not visualized. Mild aortic valve sclerosis is present, with no evidence of aortic valve stenosis.  6. The inferior vena cava is dilated in size with >50% respiratory variability, suggesting right atrial pressure of 8 mmHg.  7. The patient was in atrial fibrillation. FINDINGS  Left Ventricle: Left ventricular ejection fraction, by estimation, is 45 to 50%. The left ventricle has mildly decreased function. The left ventricle demonstrates global hypokinesis. The left ventricular internal cavity size was normal in size. There is  mild left ventricular hypertrophy. Left ventricular diastolic parameters are indeterminate. Right Ventricle: The right ventricular size is normal. No increase in right ventricular wall thickness. Right ventricular systolic function is normal. There  is mildly elevated pulmonary artery systolic pressure. The tricuspid regurgitant velocity is 2.68  m/s, and with an assumed right atrial pressure of 8 mmHg, the estimated right ventricular systolic pressure is 36.7 mmHg. Left Atrium: Left atrial size was normal in size. Right Atrium: Right atrial size was mildly dilated. Pericardium: There is no evidence of pericardial effusion. Mitral Valve: The mitral valve is normal in structure. Trivial mitral valve regurgitation. No evidence of mitral valve stenosis. Tricuspid Valve: The tricuspid valve is normal in structure. Tricuspid valve regurgitation is trivial. Aortic Valve: The aortic valve is tricuspid. Aortic valve regurgitation is not visualized. Mild aortic valve sclerosis is present, with no evidence of aortic valve stenosis. Pulmonic Valve: The pulmonic valve was normal in structure.  Pulmonic valve regurgitation is not visualized. Aorta: The aortic root is normal in size and structure. Venous: The inferior vena cava is dilated in size with greater than 50% respiratory variability, suggesting right atrial pressure of 8 mmHg. IAS/Shunts: No atrial level shunt detected by color flow Doppler.  LEFT VENTRICLE PLAX 2D LVIDd:         5.00 cm LVIDs:         3.90 cm LV PW:         1.30 cm LV IVS:        1.60 cm LVOT diam:     2.20 cm LV SV:         85 LV SV Index:   45 LVOT Area:     3.80 cm  LV Volumes (MOD) LV vol d, MOD A2C: 83.8 ml LV vol d, MOD A4C: 81.3 ml LV vol s, MOD A2C: 40.0 ml LV vol s, MOD A4C: 44.7 ml LV SV MOD A2C:     43.8 ml LV SV MOD A4C:     81.3 ml LV SV MOD BP:      40.5 ml RIGHT VENTRICLE             IVC RV S prime:     10.90 cm/s  IVC diam: 2.30 cm TAPSE (M-mode): 1.8 cm LEFT ATRIUM             Index       RIGHT ATRIUM           Index LA diam:        3.90 cm 2.09 cm/m  RA Area:     18.40 cm LA Vol (A2C):   43.3 ml 23.21 ml/m RA Volume:   50.30 ml  26.96 ml/m LA Vol (A4C):   32.6 ml 17.47 ml/m LA Biplane Vol: 37.9 ml 20.31 ml/m  AORTIC VALVE  LVOT Vmax:   121.00 cm/s LVOT Vmean:  94.600 cm/s LVOT VTI:    0.223 m  AORTA Ao Root diam: 3.30 cm MITRAL VALVE               TRICUSPID VALVE MV Area (PHT): 3.82 cm    TR Peak grad:   28.7 mmHg MV Decel Time: 199 msec    TR Vmax:        268.00 cm/s MV E velocity: 88.67 cm/s                            SHUNTS                            Systemic VTI:  0.22 m                            Systemic Diam: 2.20 cm Marca Ancona MD Electronically signed by Marca Ancona MD Signature Date/Time: 06/16/2019/6:12:00 PM    Final        Subjective: Patient seen and examined at bedside.  He denies any worsening shortness of breath, fever or chest pain.  He feels better and wants to go home today.  Discharge Exam: Vitals:   06/17/19 0747 06/17/19 0901  BP:  (!) 145/66  Pulse:  73  Resp:  (!) 21  Temp:  (!) 97.5 F (36.4 C)  SpO2: 92% 91%    General: Pt is alert, awake, not in acute distress.  Looks older than stated  age.  Looks chronically ill. Cardiovascular: rate controlled, S1/S2 + Respiratory: bilateral decreased breath sounds at bases with some scattered crackles, intermittently tachypneic, very mild wheezing. Abdominal: Soft, NT, ND, bowel sounds + Extremities: no edema, no cyanosis    The results of significant diagnostics from this hospitalization (including imaging, microbiology, ancillary and laboratory) are listed below for reference.     Microbiology: Recent Results (from the past 240 hour(s))  Respiratory Panel by RT PCR (Flu A&B, Covid) - Nasopharyngeal Swab     Status: None   Collection Time: 06/15/19  8:31 PM   Specimen: Nasopharyngeal Swab  Result Value Ref Range Status   SARS Coronavirus 2 by RT PCR NEGATIVE NEGATIVE Final    Comment: (NOTE) SARS-CoV-2 target nucleic acids are NOT DETECTED. The SARS-CoV-2 RNA is generally detectable in upper respiratoy specimens during the acute phase of infection. The lowest concentration of SARS-CoV-2 viral copies this assay can detect  is 131 copies/mL. A negative result does not preclude SARS-Cov-2 infection and should not be used as the sole basis for treatment or other patient management decisions. A negative result may occur with  improper specimen collection/handling, submission of specimen other than nasopharyngeal swab, presence of viral mutation(s) within the areas targeted by this assay, and inadequate number of viral copies (<131 copies/mL). A negative result must be combined with clinical observations, patient history, and epidemiological information. The expected result is Negative. Fact Sheet for Patients:  PinkCheek.be Fact Sheet for Healthcare Providers:  GravelBags.it This test is not yet ap proved or cleared by the [place] FDA and  has been authorized for detection and/or diagnosis of SARS-CoV-2 by FDA under an Emergency Use Authorization (EUA). This EUA will remain  in effect (meaning this test can be used) for the duration of the COVID-19 declaration under Section 564(b)(1) of the Act, 21 U.S.C. section 360bbb-3(b)(1), unless the authorization is terminated or revoked sooner.    Influenza A by PCR NEGATIVE NEGATIVE Final   Influenza B by PCR NEGATIVE NEGATIVE Final    Comment: (NOTE) The Xpert Xpress SARS-CoV-2/FLU/RSV assay is intended as an aid in  the diagnosis of influenza from Nasopharyngeal swab specimens and  should not be used as a sole basis for treatment. Nasal washings and  aspirates are unacceptable for Xpert Xpress SARS-CoV-2/FLU/RSV  testing. Fact Sheet for Patients: PinkCheek.be Fact Sheet for Healthcare Providers: GravelBags.it This test is not yet approved or cleared by the [place] FDA and  has been authorized for detection and/or diagnosis of SARS-CoV-2 by  FDA under an Emergency Use Authorization (EUA). This EUA will remain  in effect (meaning this  test can be used) for the duration of the  Covid-19 declaration under Section 564(b)(1) of the Act, 21  U.S.C. section 360bbb-3(b)(1), unless the authorization is  terminated or revoked. Performed at [facility], [place] [address], [place], [place] [address]      Labs: BNP (last 3 results) Recent Labs    03/10/19 0435 03/25/19 1321 06/15/19 1533  BNP 41.1 66.6 69.6   Basic Metabolic Panel: Recent Labs  Lab 06/15/19 1533 06/16/19 0349 06/17/19 0534  NA 138 140 137  K 4.3 5.4* 4.7  CL 96* 95* 98  CO2 33* 33* 30  GLUCOSE 103* 216* 168*  BUN 12 16 24*  CREATININE 1.13 1.34* 1.27*  CALCIUM 9.3 9.7 9.0  MG  --   --  2.1   Liver Function Tests: Recent Labs  Lab 06/15/19 1533  AST 20  ALT 17  ALKPHOS 62  BILITOT 1.0  PROT 6.7  ALBUMIN 3.9   No results for input(s): LIPASE, AMYLASE in the last 168 hours. No results for input(s): AMMONIA in the last 168 hours. CBC: Recent Labs  Lab 06/15/19 1533 06/16/19 0349 06/17/19 0534  WBC 7.0 7.6 17.9*  NEUTROABS 5.4  --  16.8*  HGB 13.9 14.1 12.9*  HCT 43.3 45.0 39.1  MCV 95.4 96.6 94.2  PLT 217 204 230   Cardiac Enzymes: No results for input(s): CKTOTAL, CKMB, CKMBINDEX, TROPONINI in the last 168 hours. BNP: Invalid input(s): POCBNP CBG: No results for input(s): GLUCAP in the last 168 hours. D-Dimer No results for input(s): DDIMER in the last 72 hours. Hgb A1c No results for input(s): HGBA1C in the last 72 hours. Lipid Profile No results for input(s): CHOL, HDL, LDLCALC, TRIG, CHOLHDL, LDLDIRECT in the last 72 hours. Thyroid function studies No results for input(s): TSH, T4TOTAL, T3FREE, THYROIDAB in the last 72 hours.  Invalid input(s): FREET3 Anemia work up No results for input(s): VITAMINB12, FOLATE, FERRITIN, TIBC, IRON, RETICCTPCT in the last 72 hours. Urinalysis    Component Value Date/Time   COLORURINE YELLOW 12/08/2012 1516   APPEARANCEUR CLEAR 12/08/2012 1516   LABSPEC 1.030 12/08/2012 1516    PHURINE 6.0 12/08/2012 1516   GLUCOSEU NEGATIVE 12/08/2012 1516   HGBUR NEGATIVE 12/08/2012 1516   BILIRUBINUR NEGATIVE 12/08/2012 1516   KETONESUR NEGATIVE 12/08/2012 1516   PROTEINUR NEGATIVE 12/08/2012 1516   UROBILINOGEN 0.2 12/08/2012 1516   NITRITE NEGATIVE 12/08/2012 1516   LEUKOCYTESUR NEGATIVE 12/08/2012 1516   Sepsis Labs Invalid input(s): PROCALCITONIN,  WBC,  LACTICIDVEN Microbiology Recent Results (from the past 240 hour(s))  Respiratory Panel by RT PCR (Flu A&B, Covid) - Nasopharyngeal Swab     Status: None   Collection Time: 06/15/19  8:31 PM   Specimen: Nasopharyngeal Swab  Result Value Ref Range Status   SARS Coronavirus 2 by RT PCR NEGATIVE NEGATIVE Final    Comment: (NOTE) SARS-CoV-2 target nucleic acids are NOT DETECTED. The SARS-CoV-2 RNA is generally detectable in upper respiratoy specimens during the acute phase of infection. The lowest concentration of SARS-CoV-2 viral copies this assay can detect is 131 copies/mL. A negative result does not preclude SARS-Cov-2 infection and should not be used as the sole basis for treatment or other patient management decisions. A negative result may occur with  improper specimen collection/handling, submission of specimen other than nasopharyngeal swab, presence of viral mutation(s) within the areas targeted by this assay, and inadequate number of viral copies (<131 copies/mL). A negative result must be combined with clinical observations, patient history, and epidemiological information. The expected result is Negative. Fact Sheet for Patients:  https://www.moore.com/ Fact Sheet for Healthcare Providers:  https://www.young.biz/ This test is not yet ap proved or cleared by the [place] FDA and  has been authorized for detection and/or diagnosis of SARS-CoV-2 by FDA under an Emergency Use Authorization (EUA). This EUA will remain  in effect (meaning this test can be used)  for the duration of the COVID-19 declaration under Section 564(b)(1) of the Act, 21 U.S.C. section 360bbb-3(b)(1), unless the authorization is terminated or revoked sooner.    Influenza A by PCR NEGATIVE NEGATIVE Final   Influenza B by PCR NEGATIVE NEGATIVE Final    Comment: (NOTE) The Xpert Xpress SARS-CoV-2/FLU/RSV assay is intended as an aid in  the diagnosis of influenza from Nasopharyngeal swab specimens and  should not be used as a sole basis for treatment. Nasal washings and  aspirates are unacceptable for Xpert Xpress SARS-CoV-2/FLU/RSV  testing. Fact Sheet for Patients: https://www.moore.com/ Fact Sheet for Healthcare Providers: https://www.young.biz/ This test is not yet approved or cleared by the [place] FDA and  has been authorized for detection and/or diagnosis of SARS-CoV-2 by  FDA under an Emergency Use Authorization (EUA). This EUA will remain  in effect (meaning this test can be used) for the duration of the  Covid-19 declaration under Section 564(b)(1) of the Act, 21  U.S.C. section 360bbb-3(b)(1), unless the authorization is  terminated or revoked. Performed at [facility], [address], [place], [place] [address]      Time coordinating discharge: 35 minutes  SIGNED:   Glade Lloyd, MD  [facility] 06/17/2019, 10:23 AM

## 2019-06-17 NOTE — Evaluation (Signed)
Occupational Therapy Evaluation Patient Details Name: Johnny Rodriguez MRN: [id number] DOB: 1950-05-29 Today's Date: 06/17/2019    History of Present Illness Pt is 69 year old male with history of CAD, COPD, chronic hypoxic respite failure on 3 L home oxygen, ongoing tobacco abuse, hypertension, paroxysmal A. fib presented with shortness of breath and chest tightness.  He was admitted for COPD exacerbation along with A. fib with RVR and started on steroids as well as Cardizem drip (Not on drip at time of PT eval)   Clinical Impression   PTA pt living alone in a motel. He uses 3L [place] at baseline and has a [facility] RN that intermittently checks in on him. At time of eval, pt completed mobility and BADL at mod I level. Pt dressed himself for d/c, taking several rest breaks to conserve energy. Pt also performed appropriate pursed lip breathing. He states this is baseline for him to complete multi step tasks. Pt planned for d/c this date. OT will sign off, thank you for this consult.     Follow Up Recommendations  No OT follow up    Equipment Recommendations  None recommended by OT    Recommendations for Other Services       Precautions / Restrictions Precautions Precautions: None Restrictions Weight Bearing Restrictions: No      Mobility Bed Mobility               General bed mobility comments: up in chair  Transfers Overall transfer level: Modified independent                    Balance Overall balance assessment: Mild deficits observed, not formally tested                                         ADL either performed or assessed with clinical judgement   ADL Overall ADL's : Modified independent                                       General ADL Comments: Pt completed full body dressing during session at mod I level. He needed several self initiated rest breaks. Pt self cued these rest breaks and performed pursed lip breathing      Vision Patient Visual Report: No change from baseline       Perception     Praxis      Pertinent Vitals/Pain Pain Assessment: No/denies pain     Hand Dominance     Extremity/Trunk Assessment Upper Extremity Assessment Upper Extremity Assessment: Overall WFL for tasks assessed   Lower Extremity Assessment Lower Extremity Assessment: Defer to PT evaluation       Communication Communication Communication: No difficulties   Cognition Arousal/Alertness: Awake/alert Behavior During Therapy: WFL for tasks assessed/performed Overall Cognitive Status: Within Functional Limits for tasks assessed                                     General Comments       Exercises     Shoulder Instructions      Home Living Family/patient expects to be discharged to:: Other (Comment)(motel) Living Arrangements: Alone Available Help at Discharge: Other (Comment)(home health RN intermittently) Type of Home: Other(Comment)(motel) Home Access: Level  entry     Home Layout: One level     Bathroom Shower/Tub: Teacher, early years/pre: [place] - single point;Walker - 2 wheels          Prior Functioning/Environment Level of Independence: Independent with assistive device(s)        Comments: Pt reports does not drive but independent with ADLs, IADLs, grocery shopping, etc.  Typically, uses a cane.  Pt reports can ambulate in store but will use motorized cart if available.  Wears 3 LPM O2 at all times.  Reports was receiving HHPT        OT Problem List: Decreased knowledge of use of DME or AE;Decreased knowledge of precautions;Decreased activity tolerance;Cardiopulmonary status limiting activity      OT Treatment/Interventions:      OT Goals(Current goals can be found in the care plan section) Acute Rehab OT Goals Patient Stated Goal: home today OT Goal Formulation: All assessment and education complete, DC therapy  OT  Frequency:     Barriers to D/C:            Co-evaluation              AM-PAC OT "6 Clicks" Daily Activity     Outcome Measure Help from another person eating meals?: None Help from another person taking care of personal grooming?: None Help from another person toileting, which includes using toliet, bedpan, or urinal?: None Help from another person bathing (including washing, rinsing, drying)?: None Help from another person to put on and taking off regular upper body clothing?: None Help from another person to put on and taking off regular lower body clothing?: None 6 Click Score: 24   End of Session Equipment Utilized During Treatment: Oxygen Nurse Communication: Mobility status  Activity Tolerance: Patient tolerated treatment well Patient left: in chair;with call bell/phone within reach  OT Visit Diagnosis: Other abnormalities of gait and mobility (R26.89)                Time: 7371-0626 OT Time Calculation (min): 17 min Charges:  OT General Charges $OT Visit: 1 Visit OT Evaluation $OT Eval Low Complexity: 1 Low  Zenovia Jarred, MSOT, OTR/L Acute Rehabilitation Services [facility] Office Number: [phone] Pager: [phone]  Zenovia Jarred 06/17/2019, 12:45 PM

## 2019-07-13 ENCOUNTER — Ambulatory Visit: Payer: Medicare Other | Admitting: Internal Medicine

## 2019-07-14 ENCOUNTER — Encounter: Payer: Self-pay | Admitting: *Deleted

## 2019-07-14 NOTE — Congregational Nurse Program (Signed)
  Dept: [phone]   Congregational Nurse Program Note  Date of Encounter: 07/14/2019  Past Medical History: Past Medical History:  Diagnosis Date  . AF (atrial fibrillation) (HCC)   . Alcohol abuse   . Angina pectoris   . CAD (coronary artery disease) 12/15/2004  . COPD (chronic obstructive pulmonary disease) (HCC)   . Coronary artery disease   . Heavy cigarette smoker   . HTN (hypertension)   . Idiopathic cardiomyopathy (HCC) 07/16/2009  . Non compliance with medical treatment   . Seizures (HCC)     Encounter Details: CNP Questionnaire - 07/14/19 1058      Questionnaire   Patient Status  Not Applicable    Race  White or Caucasian    Location Patient Served At  --   other [facility]    Uninsured  Not Applicable    Food  No food insecurities    Housing/Utilities  Yes, have permanent housing    Transportation  No transportation needs    Interpersonal Safety  Yes, feel physically and emotionally safe where you currently live    Medication  No medication insecurities    Medical Provider  Yes    Referrals  Not Applicable    ED Visit Averted  Not Applicable    Life-Saving Intervention Made  Not Applicable      [atient with faulty o2 concentrator [facility] is provider called [employer] for patient.  They will service device and get a service. tct-[place] alerted that they will call ahead of time to service the machine.

## 2019-07-15 ENCOUNTER — Telehealth: Payer: Self-pay | Admitting: *Deleted

## 2019-07-15 NOTE — Telephone Encounter (Signed)
Spoke with Harriet Pho with [employer] dme services.  Will have representative contact patient at his home and see what needs to be done. tct [place]/ informed of the above.

## 2019-07-15 NOTE — Telephone Encounter (Signed)
[id number]/[per clois he has not rec'd a call from [employer] dme about his eqip.  [facility] placed on hold for approx.16 mins. Then was told to go into queue.  Left call back number.

## 2019-08-10 ENCOUNTER — Encounter: Payer: Self-pay | Admitting: Student

## 2019-08-10 ENCOUNTER — Other Ambulatory Visit: Payer: Self-pay

## 2019-08-10 ENCOUNTER — Ambulatory Visit (INDEPENDENT_AMBULATORY_CARE_PROVIDER_SITE_OTHER): Payer: Medicare Other | Admitting: Student

## 2019-08-10 VITALS — BP 110/68 | HR 72 | Ht 71.0 in | Wt 187.0 lb

## 2019-08-10 DIAGNOSIS — I4819 Other persistent atrial fibrillation: Secondary | ICD-10-CM

## 2019-08-10 DIAGNOSIS — I10 Essential (primary) hypertension: Secondary | ICD-10-CM

## 2019-08-10 DIAGNOSIS — J441 Chronic obstructive pulmonary disease with (acute) exacerbation: Secondary | ICD-10-CM

## 2019-08-10 DIAGNOSIS — Z7901 Long term (current) use of anticoagulants: Secondary | ICD-10-CM | POA: Diagnosis not present

## 2019-08-10 DIAGNOSIS — I5022 Chronic systolic (congestive) heart failure: Secondary | ICD-10-CM | POA: Diagnosis not present

## 2019-08-10 MED ORDER — SPIRONOLACTONE 25 MG PO TABS: 25.0000 mg | ORAL_TABLET | Freq: Every day | ORAL | 6 refills | Status: DC

## 2019-08-10 MED ORDER — MOMETASONE FURO-FORMOTEROL FUM 200-5 MCG/ACT IN AERO: 2.0000 | INHALATION_SPRAY | Freq: Two times a day (BID) | RESPIRATORY_TRACT | 0 refills | Status: DC

## 2019-08-10 MED ORDER — FUROSEMIDE 40 MG PO TABS: 40.0000 mg | ORAL_TABLET | Freq: Every day | ORAL | 6 refills | Status: DC

## 2019-08-10 MED ORDER — TIOTROPIUM BROMIDE MONOHYDRATE 18 MCG IN CAPS: 1.0000 | ORAL_CAPSULE | Freq: Every day | RESPIRATORY_TRACT | 0 refills | Status: DC

## 2019-08-10 MED ORDER — ALBUTEROL SULFATE HFA 108 (90 BASE) MCG/ACT IN AERS: 1.0000 | INHALATION_SPRAY | Freq: Four times a day (QID) | RESPIRATORY_TRACT | 0 refills | Status: DC | PRN

## 2019-08-10 NOTE — Patient Instructions (Signed)
Medication Instructions:  *If you need a refill on your cardiac medications before your next appointment, please call your pharmacy*  Lab Work: If you have labs (blood work) drawn today and your tests are completely normal, you will receive your results only by: Marland Kitchen MyChart Message (if you have MyChart) OR . A paper copy in the mail If you have any lab test that is abnormal or we need to change your treatment, we will call you to review the results.  Follow-Up: At [facility], you and your health needs are our priority.  As part of our continuing mission to provide you with exceptional heart care, we have created designated Provider Care Teams.  These Care Teams include your primary Cardiologist (physician) and Advanced Practice Providers (APPs -  Physician Assistants and Nurse Practitioners) who all work together to provide you with the care you need, when you need it.  We recommend signing up for the patient portal called "MyChart".  Sign up information is provided on this After Visit Summary.  MyChart is used to connect with patients for Virtual Visits (Telemedicine).  Patients are able to view lab/test results, encounter notes, upcoming appointments, etc.  Non-urgent messages can be sent to your provider as well.   To learn more about what you can do with MyChart, go to [email or web address].    Your next appointment:   Your physician wants you to follow-up in: 6 MONTHS with Dr. Ladona Ridgel. You will receive a reminder letter in the mail two months in advance. If you don't receive a letter, please call our office to schedule the follow-up appointment.  The format for your next appointment:   In Person with Dr. Ladona Ridgel.

## 2019-08-10 NOTE — Progress Notes (Signed)
PCP:  Gwenyth Bender, MD Primary Cardiologist: No primary care provider on file. Electrophysiologist: Lewayne Bunting, MD   Johnny Rodriguez is a 69 y.o. male seen today for Lewayne Bunting, MD for routine electrophysiology followup.  Since last being seen in our clinic the patient reports doing OK. He has had more issues with COPD lately. Felt very poorly last as he has been out of his inhalers. He also has been out of lasix for approximately 1 week. He lives by himself and does not drive. He has issues getting his medications at times. He has home health and a friend who lives nearby who helps him. He has SOB with most exertion in setting of severe COPD on O2.   Past Medical History:  Diagnosis Date  . AF (atrial fibrillation) (HCC)   . Alcohol abuse   . Angina pectoris   . CAD (coronary artery disease) 12/15/2004  . COPD (chronic obstructive pulmonary disease) (HCC)   . Coronary artery disease   . Heavy cigarette smoker   . HTN (hypertension)   . Idiopathic cardiomyopathy (HCC) 07/16/2009  . Non compliance with medical treatment   . Seizures (HCC)    Past Surgical History:  Procedure Laterality Date  . CARDIAC CATHETERIZATION  2006  . CARDIAC CATHETERIZATION  12/2004   Left   . CARDIOVERSION  01/21/2011   Procedure: CARDIOVERSION;  Surgeon: Darden Palmer., MD;  Location: [facility] OR;  Service: Cardiovascular;  Laterality: N/A;  . DOPPLER ECHOCARDIOGRAPHY  02/2011  . ELECTROPHYSIOLOGY STUDY N/A 12/16/2011   Procedure: ELECTROPHYSIOLOGY STUDY;  Surgeon: Marinus Maw, MD;  Location[facility] CATH LAB;  Service: Cardiovascular;  Laterality: N/A;  . EP IMPLANTABLE DEVICE N/A 08/24/2015   Procedure: Loop Recorder Removal;  Surgeon: Marinus Maw, MD;  Location: [facility] INVASIVE CV LAB;  Service: Cardiovascular;  Laterality: N/A;  . fx collar bone    . NO PAST SURGERIES      Current Outpatient Medications  Medication Sig Dispense Refill  . albuterol (PROAIR HFA) 108 (90 Base) MCG/ACT inhaler Inhale  1-2 puffs into the lungs every 6 (six) hours as needed for wheezing or shortness of breath. 18 g 0  . dabigatran (PRADAXA) 150 MG CAPS capsule Take 1 capsule (150 mg total) by mouth 2 (two) times daily. Medicaid [id number] O 60 capsule 0  . diltiazem (CARDIZEM CD) 240 MG 24 hr capsule Take 1 capsule (240 mg total) by mouth daily. Medicaid [id number] O 30 capsule 0  . Ergocalciferol (VITAMIN D2) 2000 units TABS Take 2,000 Units by mouth daily.     . folic acid (FOLVITE) 1 MG tablet Take 1 tablet (1 mg total) by mouth daily. 30 tablet 0  . losartan (COZAAR) 25 MG tablet Take 1 tablet (25 mg total) by mouth daily. Medicaid [id number] O 30 tablet 0  . meloxicam (MOBIC) 7.5 MG tablet Take 1 tablet by mouth as needed.    . metoprolol succinate (TOPROL-XL) 50 MG 24 hr tablet Take 1 tablet (50 mg total) by mouth daily. Take with or immediately following a meal. Medicaid [id number] O 90 tablet 3  . mometasone-formoterol (DULERA) 200-5 MCG/ACT AERO Inhale 2 puffs into the lungs 2 (two) times daily. Medicaid [id number] O 13 g 0  . Multiple Vitamin (MULTIVITAMIN WITH MINERALS) TABS tablet Take 1 tablet by mouth daily.    . OXYGEN Inhale 3 L into the lungs continuous.     . polyethylene glycol (MIRALAX / GLYCOLAX) 17 g packet Take 17 g by mouth daily. 30 each 0  .  senna (SENOKOT) 8.6 MG TABS tablet Take 1-2 tablets (8.6-17.2 mg total) by mouth daily. 30 tablet 0  . sertraline (ZOLOFT) 100 MG tablet Take 100 mg by mouth daily.    . simvastatin (ZOCOR) 20 MG tablet Take 20 mg by mouth at bedtime.    Marland Kitchen spironolactone (ALDACTONE) 25 MG tablet Take 1 tablet (25 mg total) by mouth daily. 30 tablet 6  . thiamine 100 MG tablet Take 1 tablet (100 mg total) by mouth daily. 30 tablet 0  . tiotropium (SPIRIVA) 18 MCG inhalation capsule Place 1 capsule (18 mcg total) into inhaler and inhale daily. 30 capsule 0  . furosemide (LASIX) 40 MG tablet Take 1 tablet (40 mg total) by mouth daily. Medicaid [id number] O 30 tablet 6   No current  facility-administered medications for this visit.    No Known Allergies  Social History   Socioeconomic History  . Marital status: Single    Spouse name: Not on file  . Number of children: Not on file  . Years of education: Not on file  . Highest education level: Not on file  Occupational History  . Occupation: Production manager: UNEMPLOYED  Tobacco Use  . Smoking status: Current Every Day Smoker    Packs/day: 1.00    Years: 58.00    Pack years: 58.00  . Smokeless tobacco: Never Used  . Tobacco comment: pt states he is down to 1ppd  Vaping Use  . Vaping Use: Never used  Substance and Sexual Activity  . Alcohol use: Yes    Alcohol/week: 3.0 standard drinks    Types: 3 Cans of beer per week    Comment: EtOH use diorder - liquor  . Drug use: No  . Sexual activity: Not on file  Other Topics Concern  . Not on file  Social History Narrative   ** Merged History Encounter **       Social Determinants of Health   Financial Resource Strain:   . Difficulty of Paying Living Expenses:   Food Insecurity:   . Worried About Programme researcher, broadcasting/film/video in the Last Year:   . Barista in the Last Year:   Transportation Needs:   . Freight forwarder (Medical):   Marland Kitchen Lack of Transportation (Non-Medical):   Physical Activity:   . Days of Exercise per Week:   . Minutes of Exercise per Session:   Stress:   . Feeling of Stress :   Social Connections:   . Frequency of Communication with Friends and Family:   . Frequency of Social Gatherings with Friends and Family:   . Attends Religious Services:   . Active Member of Clubs or Organizations:   . Attends Banker Meetings:   Marland Kitchen Marital Status:   Intimate Partner Violence:   . Fear of Current or Ex-Partner:   . Emotionally Abused:   Marland Kitchen Physically Abused:   . Sexually Abused:      Review of Systems: All other systems reviewed and are otherwise negative except as noted above.  Physical Exam: Vitals:    08/10/19 1332  BP: 110/68  Pulse: 72  SpO2: 92%  Weight: 187 lb (84.8 kg)  Height: 5\' 11"  (1.803 m)    GEN- The patient is chronically ill appearing, alert and oriented x 3 today.   HEENT: normocephalic, atraumatic; sclera clear, conjunctiva pink; hearing intact; oropharynx clear; neck supple, no JVP Lymph- no cervical lymphadenopathy Lungs- Diminished throughout. Normal work of breathing.  No wheezes,  rales, rhonchi Heart- Regular rate and rhythm, no murmurs, rubs or gallops, PMI not laterally displaced GI- soft, non-tender, non-distended, bowel sounds present, no hepatosplenomegaly Extremities- no clubbing, cyanosis, or edema; DP/PT/radial pulses 2+ bilaterally MS- no significant deformity or atrophy Skin- warm and dry, no rash or lesion Psych- euthymic mood, full affect Neuro- strength and sensation are intact  EKG is ordered. Personal review of EKG from today shows AF with controlled ventricular rate at 61 bpm  Additional studies reviewed include: Previous EP notes, recent admission notes  Assessment and Plan:  1. Atrial fibrillation, persistent (? Permanent) He appears to have been in AF by all recent EKGs Continue rate control Continue pradaxa for CHA2DS2VASC of at least 4    2. Dyspnea Previously on amiodarone, which was stopped for ? Pulmonary toxicity He is out of all three of his inhalers.   Will send 1 fill each, no refills.  Encouraged follow up with his PCP and/or pulm. Friend who is with him today states he would help make sure this happens.  3. HTN Continue current medications  4. Chronic systolic CHF EF 45-50% from 50-55% (This was also in the setting of acute illness) On GDMT with BB, spiro, and losartan Refill lasix.  Encouraged medication compliance.   Graciella Freer, PA-C  08/10/19 2:29 PM

## 2019-09-05 ENCOUNTER — Other Ambulatory Visit: Payer: Self-pay

## 2019-09-05 ENCOUNTER — Emergency Department (HOSPITAL_COMMUNITY)
Admission: EM | Admit: 2019-09-05 | Discharge: 2019-09-05 | Disposition: A | Discharge status: Home / Self Care | Payer: Medicare Other | Attending: Emergency Medicine | Admitting: Emergency Medicine

## 2019-09-05 ENCOUNTER — Emergency Department (HOSPITAL_COMMUNITY): Payer: Medicare Other

## 2019-09-05 ENCOUNTER — Encounter (HOSPITAL_COMMUNITY): Payer: Self-pay | Admitting: Emergency Medicine

## 2019-09-05 DIAGNOSIS — T17208A Unspecified foreign body in pharynx causing other injury, initial encounter: Secondary | ICD-10-CM | POA: Diagnosis not present

## 2019-09-05 DIAGNOSIS — F17210 Nicotine dependence, cigarettes, uncomplicated: Secondary | ICD-10-CM | POA: Insufficient documentation

## 2019-09-05 DIAGNOSIS — R0789 Other chest pain: Secondary | ICD-10-CM | POA: Insufficient documentation

## 2019-09-05 DIAGNOSIS — Z79899 Other long term (current) drug therapy: Secondary | ICD-10-CM | POA: Diagnosis not present

## 2019-09-05 DIAGNOSIS — J449 Chronic obstructive pulmonary disease, unspecified: Secondary | ICD-10-CM | POA: Insufficient documentation

## 2019-09-05 DIAGNOSIS — I10 Essential (primary) hypertension: Secondary | ICD-10-CM | POA: Insufficient documentation

## 2019-09-05 DIAGNOSIS — Y999 Unspecified external cause status: Secondary | ICD-10-CM | POA: Diagnosis not present

## 2019-09-05 DIAGNOSIS — Z20822 Contact with and (suspected) exposure to covid-19: Secondary | ICD-10-CM | POA: Diagnosis not present

## 2019-09-05 DIAGNOSIS — Y939 Activity, unspecified: Secondary | ICD-10-CM | POA: Diagnosis not present

## 2019-09-05 DIAGNOSIS — Y929 Unspecified place or not applicable: Secondary | ICD-10-CM | POA: Diagnosis not present

## 2019-09-05 DIAGNOSIS — X58XXXA Exposure to other specified factors, initial encounter: Secondary | ICD-10-CM | POA: Diagnosis not present

## 2019-09-05 DIAGNOSIS — R062 Wheezing: Secondary | ICD-10-CM | POA: Diagnosis not present

## 2019-09-05 DIAGNOSIS — I2510 Atherosclerotic heart disease of native coronary artery without angina pectoris: Secondary | ICD-10-CM | POA: Insufficient documentation

## 2019-09-05 DIAGNOSIS — R0602 Shortness of breath: Secondary | ICD-10-CM | POA: Diagnosis present

## 2019-09-05 LAB — CBC WITH DIFFERENTIAL/PLATELET
Abs Immature Granulocytes: 0.02 10*3/uL (ref 0.00–0.07)
Basophils Absolute: 0 10*3/uL (ref 0.0–0.1)
Basophils Relative: 0 %
Eosinophils Absolute: 0.1 10*3/uL (ref 0.0–0.5)
Eosinophils Relative: 1 %
HCT: 44.3 % (ref 39.0–52.0)
Hemoglobin: 14.5 g/dL (ref 13.0–17.0)
Immature Granulocytes: 0 %
Lymphocytes Relative: 12 %
Lymphs Abs: 0.9 10*3/uL (ref 0.7–4.0)
MCH: 31.3 pg (ref 26.0–34.0)
MCHC: 32.7 g/dL (ref 30.0–36.0)
MCV: 95.5 fL (ref 80.0–100.0)
Monocytes Absolute: 0.7 10*3/uL (ref 0.1–1.0)
Monocytes Relative: 10 %
Neutro Abs: 5.7 10*3/uL (ref 1.7–7.7)
Neutrophils Relative %: 77 %
Platelets: 289 10*3/uL (ref 150–400)
RBC: 4.64 MIL/uL (ref 4.22–5.81)
RDW: 13.2 % (ref 11.5–15.5)
WBC: 7.4 10*3/uL (ref 4.0–10.5)
nRBC: 0 % (ref 0.0–0.2)

## 2019-09-05 LAB — I-STAT VENOUS BLOOD GAS, ED
Acid-Base Excess: 7 mmol/L — ABNORMAL HIGH (ref 0.0–2.0)
Bicarbonate: 34 mmol/L — ABNORMAL HIGH (ref 20.0–28.0)
Calcium, Ion: 1.1 mmol/L — ABNORMAL LOW (ref 1.15–1.40)
HCT: 43 % (ref 39.0–52.0)
Hemoglobin: 14.6 g/dL (ref 13.0–17.0)
O2 Saturation: 97 %
Potassium: 3.8 mmol/L (ref 3.5–5.1)
Sodium: 140 mmol/L (ref 135–145)
TCO2: 36 mmol/L — ABNORMAL HIGH (ref 22–32)
pCO2, Ven: 55.4 mmHg (ref 44.0–60.0)
pH, Ven: 7.396 (ref 7.250–7.430)
pO2, Ven: 90 mmHg — ABNORMAL HIGH (ref 32.0–45.0)

## 2019-09-05 LAB — I-STAT ARTERIAL BLOOD GAS, ED
Acid-Base Excess: 11 mmol/L — ABNORMAL HIGH (ref 0.0–2.0)
Bicarbonate: 38.6 mmol/L — ABNORMAL HIGH (ref 20.0–28.0)
Calcium, Ion: 1.16 mmol/L (ref 1.15–1.40)
HCT: 41 % (ref 39.0–52.0)
Hemoglobin: 13.9 g/dL (ref 13.0–17.0)
O2 Saturation: 94 %
Patient temperature: 99.8
Potassium: 3.9 mmol/L (ref 3.5–5.1)
Sodium: 139 mmol/L (ref 135–145)
TCO2: 40 mmol/L — ABNORMAL HIGH (ref 22–32)
pCO2 arterial: 64.1 mmHg — ABNORMAL HIGH (ref 32.0–48.0)
pH, Arterial: 7.39 (ref 7.350–7.450)
pO2, Arterial: 78 mmHg — ABNORMAL LOW (ref 83.0–108.0)

## 2019-09-05 LAB — BASIC METABOLIC PANEL
Anion gap: 14 (ref 5–15)
BUN: 14 mg/dL (ref 8–23)
CO2: 29 mmol/L (ref 22–32)
Calcium: 9.5 mg/dL (ref 8.9–10.3)
Chloride: 94 mmol/L — ABNORMAL LOW (ref 98–111)
Creatinine, Ser: 1.23 mg/dL (ref 0.61–1.24)
GFR calc Af Amer: >60 mL/min (ref >60)
GFR calc non Af Amer: >60 mL/min (ref >60)
Glucose, Bld: 131 mg/dL — ABNORMAL HIGH (ref 70–99)
Potassium: 3.8 mmol/L (ref 3.5–5.1)
Sodium: 137 mmol/L (ref 135–145)

## 2019-09-05 LAB — BRAIN NATRIURETIC PEPTIDE: B Natriuretic Peptide: 54.4 pg/mL (ref 0.0–100.0)

## 2019-09-05 LAB — PROTIME-INR
INR: 1.1 (ref 0.8–1.2)
Prothrombin Time: 13.4 seconds (ref 11.4–15.2)

## 2019-09-05 LAB — SARS CORONAVIRUS 2 BY RT PCR (HOSPITAL ORDER, PERFORMED IN ~~LOC~~ HOSPITAL LAB): SARS Coronavirus 2: NEGATIVE

## 2019-09-05 MED ORDER — ALBUTEROL SULFATE (2.5 MG/3ML) 0.083% IN NEBU: 2.5000 mg | INHALATION_SOLUTION | Freq: Four times a day (QID) | RESPIRATORY_TRACT | Status: DC | PRN

## 2019-09-05 MED ORDER — IPRATROPIUM-ALBUTEROL 0.5-2.5 (3) MG/3ML IN SOLN
3.0000 mL | Freq: Four times a day (QID) | RESPIRATORY_TRACT | Status: DC
  Administered 2019-09-05: 3 mL via RESPIRATORY_TRACT
  Filled 2019-09-05: qty 3

## 2019-09-05 NOTE — ED Notes (Signed)
Pt dropped to 92% while ambulating. Pt denies increased sob, rr stayed between 28-33

## 2019-09-05 NOTE — ED Provider Notes (Signed)
[facility] EMERGENCY DEPARTMENT Provider Note   CSN: [id number] Arrival date & time: 09/05/19  4098     History Chief Complaint  Patient presents with  . Respiratory Distress    Johnny Rodriguez is a 69 y.o. male with a past medical history of atrial fibrillation, COPD on 3 L of oxygen via nasal cannula at baseline, CAD, hypertension, tobacco abuse presenting to the ED with a chief complaint of shortness of breath and wheezing.  States that today he began feeling short of breath despite taking his home medications and inhalers.  He does report history of intubation in the past, states he does not feel similar to that episode. He reports significant improvement with interventions by EMS including albuterol, Atrovent and Solu-Medrol, magnesium.  He denies chest pain, abdominal pain. Level 5 caveat secondary to respiratory distress and on BiPAP.   HPI     Past Medical History:  Diagnosis Date  . AF (atrial fibrillation) (HCC)   . Alcohol abuse   . Angina pectoris   . CAD (coronary artery disease) 12/15/2004  . COPD (chronic obstructive pulmonary disease) (HCC)   . Coronary artery disease   . Heavy cigarette smoker   . HTN (hypertension)   . Idiopathic cardiomyopathy (HCC) 07/16/2009  . Non compliance with medical treatment   . Seizures [facility])     Patient Active Problem List   Diagnosis Date Noted  . COPD with acute exacerbation (HCC) 03/11/2019  . AKI (acute kidney injury) (HCC) 03/10/2019  . Bowel obstruction (HCC) 03/10/2019  . Chronic respiratory failure with hypoxia (HCC) 10/15/2016  . Cigarette smoker 09/09/2016  . Long term current use of anticoagulant therapy 06/24/2016  . History of syncope 06/24/2016  . COPD exacerbation (HCC) 02/08/2015  . Essential hypertension 02/08/2015  . Syncope 12/10/2011  . History of noncompliance with medical treatment 12/01/2011  . Chronic systolic heart failure (HCC) 04/10/2011  . Idiopathic cardiomyopathy (HCC)  07/16/2009  . Paroxysmal atrial fibrillation (HCC) 07/16/2009  . COPD PFT's pending  07/16/2009  . CAD (coronary artery disease) 12/15/2004    Past Surgical History:  Procedure Laterality Date  . CARDIAC CATHETERIZATION  2006  . CARDIAC CATHETERIZATION  12/2004   Left   . CARDIOVERSION  01/21/2011   Procedure: CARDIOVERSION;  Surgeon: Darden Palmer., MD;  Location: [facility];  Service: Cardiovascular;  Laterality: N/A;  . DOPPLER ECHOCARDIOGRAPHY  02/2011  . ELECTROPHYSIOLOGY STUDY N/A 12/16/2011   Procedure: ELECTROPHYSIOLOGY STUDY;  Surgeon: Marinus Maw, MD;  Location: [facility] CATH LAB;  Service: Cardiovascular;  Laterality: N/A;  . EP IMPLANTABLE DEVICE N/A 08/24/2015   Procedure: Loop Recorder Removal;  Surgeon: Marinus Maw, MD;  Location: [facility] INVASIVE CV LAB;  Service: Cardiovascular;  Laterality: N/A;  . fx collar bone    . NO PAST SURGERIES         Family History  Problem Relation Age of Onset  . Suicidality Sister   . CVA Mother   . Stroke Mother   . Heart disease Father   . Coronary artery disease Father   . COPD Father     Social History   Tobacco Use  . Smoking status: Current Every Day Smoker    Packs/day: 1.00    Years: 58.00    Pack years: 58.00  . Smokeless tobacco: Never Used  . Tobacco comment: pt states he is down to 1ppd  Vaping Use  . Vaping Use: Never used  Substance Use Topics  . Alcohol use:  Yes    Alcohol/week: 3.0 standard drinks    Types: 3 Cans of beer per week    Comment: EtOH use diorder - liquor  . Drug use: No    Home Medications Prior to Admission medications   Medication Sig Start Date End Date Taking? Authorizing Provider  albuterol (PROAIR HFA) 108 (90 Base) MCG/ACT inhaler Inhale 1-2 puffs into the lungs every 6 (six) hours as needed for wheezing or shortness of breath. 08/10/19   Graciella Freer, PA-C  dabigatran (PRADAXA) 150 MG CAPS capsule Take 1 capsule (150 mg total) by mouth 2 (two) times daily. Medicaid  [id number] [place] 02/09/19   Benjiman Core, MD  diltiazem (CARDIZEM CD) 240 MG 24 hr capsule Take 1 capsule (240 mg total) by mouth daily. Medicaid [id number] O 02/09/19 08/10/19  Benjiman Core, MD  Ergocalciferol (VITAMIN D2) 2000 units TABS Take 2,000 Units by mouth daily.     [provider]  folic acid (FOLVITE) 1 MG tablet Take 1 tablet (1 mg total) by mouth daily. 04/04/17   Hongalgi, Maximino Greenland, MD  furosemide (LASIX) 40 MG tablet Take 1 tablet (40 mg total) by mouth daily. Medicaid [id number] O 08/10/19   Graciella Freer, PA-C  losartan (COZAAR) 25 MG tablet Take 1 tablet (25 mg total) by mouth daily. Medicaid [id number] O 02/09/19 08/10/19  Benjiman Core, MD  meloxicam (MOBIC) 7.5 MG tablet Take 1 tablet by mouth as needed. 07/04/19   [provider]  metoprolol succinate (TOPROL-XL) 50 MG 24 hr tablet Take 1 tablet (50 mg total) by mouth daily. Take with or immediately following a meal. Medicaid [id number] O 05/09/19   Marinus Maw, MD  mometasone-formoterol [facility]) 200-5 MCG/ACT AERO Inhale 2 puffs into the lungs 2 (two) times daily. Medicaid [id number] O 08/10/19   Graciella Freer, PA-C  Multiple Vitamin (MULTIVITAMIN WITH MINERALS) TABS tablet Take 1 tablet by mouth daily. 04/04/17   Hongalgi, Maximino Greenland, MD  OXYGEN Inhale 3 L into the lungs continuous.     [provider]  polyethylene glycol (MIRALAX / GLYCOLAX) 17 g packet Take 17 g by mouth daily. 03/15/19   Dellia Cloud, MD  senna (SENOKOT) 8.6 MG TABS tablet Take 1-2 tablets (8.6-17.2 mg total) by mouth daily. 03/15/19   Dellia Cloud, MD  sertraline (ZOLOFT) 100 MG tablet Take 100 mg by mouth daily.    [provider]  simvastatin (ZOCOR) 20 MG tablet Take 20 mg by mouth at bedtime. 06/04/19   [provider]  spironolactone (ALDACTONE) 25 MG tablet Take 1 tablet (25 mg total) by mouth daily. 08/10/19   Graciella Freer, PA-C  thiamine 100 MG tablet Take 1 tablet (100 mg total) by  mouth daily. 04/04/17   Hongalgi, Maximino Greenland, MD  tiotropium (SPIRIVA) 18 MCG inhalation capsule Place 1 capsule (18 mcg total) into inhaler and inhale daily. 08/10/19   Graciella Freer, PA-C    Allergies    Patient has no known allergies.  Review of Systems   Review of Systems  Unable to perform ROS: Severe respiratory distress  Respiratory: Positive for chest tightness, shortness of breath and wheezing.   Cardiovascular: Negative for chest pain.    Physical Exam Updated Vital Signs BP (!) 132/101 (BP Location: Right Arm)   Pulse 105   Temp 99.8 F (37.7 C) (Rectal)   Resp 21   Ht 5\' 11"  (1.803 m)   Wt 84.8 kg   SpO2 92%   BMI 26.07 kg/m   Physical Exam Vitals and nursing  note reviewed.  Constitutional:      General: He is not in acute distress.    Appearance: He is well-developed.     Comments: On BIPAP.  HENT:     Head: Normocephalic and atraumatic.     Nose: Nose normal.  Eyes:     General: No scleral icterus.       Left eye: No discharge.     Conjunctiva/sclera: Conjunctivae normal.  Cardiovascular:     Rate and Rhythm: Normal rate and regular rhythm.     Heart sounds: Normal heart sounds. No murmur heard.  No friction rub. No gallop.   Pulmonary:     Effort: No respiratory distress.     Breath sounds: Normal breath sounds.  Abdominal:     General: Bowel sounds are normal. There is no distension.     Palpations: Abdomen is soft.     Tenderness: There is no abdominal tenderness. There is no guarding.  Musculoskeletal:        General: Normal range of motion.     Cervical back: Normal range of motion and neck supple.  Skin:    General: Skin is warm and dry.     Findings: No rash.  Neurological:     Mental Status: He is alert.     Motor: No abnormal muscle tone.     Coordination: Coordination normal.     ED Results / Procedures / Treatments   Labs (all labs ordered are listed, but only abnormal results are displayed) Labs Reviewed  BASIC  METABOLIC PANEL - Abnormal; Notable for the following components:      Result Value   Chloride 94 (*)    Glucose, Bld 131 (*)    All other components within normal limits  I-STAT VENOUS BLOOD GAS, ED - Abnormal; Notable for the following components:   pO2, Ven 90.0 (*)    Bicarbonate 34.0 (*)    TCO2 36 (*)    Acid-Base Excess 7.0 (*)    Calcium, Ion 1.10 (*)    All other components within normal limits  I-STAT ARTERIAL BLOOD GAS, ED - Abnormal; Notable for the following components:   pCO2 arterial 64.1 (*)    pO2, Arterial 78 (*)    Bicarbonate 38.6 (*)    TCO2 40 (*)    Acid-Base Excess 11.0 (*)    All other components within normal limits  SARS CORONAVIRUS 2 BY RT PCR (HOSPITAL ORDER, PERFORMED IN [facility])  CULTURE, BLOOD (ROUTINE X 2)  CULTURE, BLOOD (ROUTINE X 2)  CBC WITH DIFFERENTIAL/PLATELET  BRAIN NATRIURETIC PEPTIDE  PROTIME-INR  BLOOD GAS, ARTERIAL  I-STAT CHEM 8, ED    EKG EKG Interpretation  Date/Time:  Monday September 05 2019 18:28:47 EDT Ventricular Rate:  132 PR Interval:    QRS Duration: 91 QT Interval:  306 QTC Calculation: 449 R Axis:   -79 Text Interpretation: Atrial fibrillation Ventricular premature complex Left anterior fascicular block Low voltage, extremity leads Consider anterior infarct Confirmed by Raeford Razor [phone]) on 09/05/2019 6:59:03 PM   Radiology DG Chest Port 1 View  Result Date: 09/05/2019 CLINICAL DATA:  COPD.  Sudden onset of respiratory distress tonight. EXAM: PORTABLE CHEST 1 VIEW COMPARISON:  06/15/2019 FINDINGS: Chronic hyperinflation. Stable heart size and mediastinal contours. Aortic atherosclerosis. New streaky opacity at the right lung base. No pulmonary edema. No pneumothorax. No significant pleural effusion. No acute osseous abnormalities are seen. IMPRESSION: 1. Streaky right basilar opacity, typical for atelectasis. Pneumonia could have a similar  appearance in the appropriate clinical setting. 2. Chronic  hyperinflation consistent with underlying chronic lung disease. Aortic Atherosclerosis (ICD10-I70.0). Electronically Signed   By: Narda Rutherford M.D.   On: 09/05/2019 19:07    Procedures Procedures (including critical care time)  Medications Ordered in ED Medications  ipratropium-albuterol (DUONEB) 0.5-2.5 (3) MG/3ML nebulizer solution 3 mL (3 mLs Nebulization Given 09/05/19 1946)  albuterol (PROVENTIL) (2.5 MG/3ML) 0.083% nebulizer solution 2.5 mg (has no administration in time range)    ED Course  I have reviewed the triage vital signs and the nursing notes.  Pertinent labs & imaging results that were available during my care of the patient were reviewed by me and considered in my medical decision making (see chart for details).  Clinical Course as of Sep 05 2115  Mon Sep 05, 2019  1914 On recheck, patient pulled off BiPAP and states that he coughed up a piece of noodle that he ate earlier today.  He states that he "feels 100% better now."  States that he was eating left over [place] today when he felt like it got lodged in his throat and so he had to call 911.   [HK]  1928 Patient now on home 3L of oxygen via [place].   [HK]  1931 Continues to be in no respiratory distress. Oxygen saturations >93% on his home 3L. States he did not begin having respiratory symptoms until he began eating the reheated pasta.   [HK]    Clinical Course User Index [HK] Dietrich Pates, PA-C   MDM Rules/Calculators/A&P                          69 year old male with past medical history of atrial fibrillation, COPD on 3 L of oxygen by nasal cannula at baseline, CAD, hypertension tobacco abuse presenting to the ED with a chief complaint of shortness of breath and wheezing.  Shortness of breath began today.  Patient initially on BiPAP so history was limited.  He was given albuterol, Atrovent, Solu-Medrol and magnesium by EMS which patient states did help his symptoms.  On recheck after labs were drawn, patient  removed BIPAP by himself and states he coughed up a piece of pasta that he was eating prior to symptom onset. He states that because this was lodged in his throat, he began having respiratory distress and called 911. After he was able to cough this up, he states he feels "100% better now." Transitioned him back to his home oxygen at 3 L and he remains with oxygen saturations above 93%.  He is speaking complete sentences without difficulty.  He was given additional breathing treatment here but continues to be asymptomatic.  Lung exam is improved without any wheezing.  Chest x-ray shows possible pneumonia although patient states that he did not have symptoms or any productive cough prior to the lodged foreign body.  EKG shows A. fib with heart rate in the 130s.  This improved to 100s.  Patient with history of A. Fib. CBC, BNP, BMP unremarkable. VBG unremarkable. Patient ambulated here with oxygen saturations between 90 to 94% on his home oxygen.  Patient is requesting discharge home as he is now asymptomatic and on his home oxygen. I feel this is reasonable based on his reassuring workup and significant improvement after foreign body removal. He declines further treatment, asking for food and taxi voucher. Patient to follow-up with PCP and to continue his home medications as prescribed. Patient discussed with and seen  by Dr. Juleen China.  Patient is hemodynamically stable, in NAD, and able to ambulate in the ED. Evaluation does not show pathology that would require ongoing emergent intervention or inpatient treatment. I explained the diagnosis to the patient. Pain has been managed and has no complaints prior to discharge. Patient is comfortable with above plan and is stable for discharge at this time. All questions were answered prior to disposition. Strict return precautions for returning to the ED were discussed. Encouraged follow up with PCP.   An After Visit Summary was printed and given to the  patient.   Portions of this note were generated with Scientist, clinical (histocompatibility and immunogenetics). Dictation errors may occur despite best attempts at proofreading.  Final Clinical Impression(s) / ED Diagnoses Final diagnoses:  Foreign body in pharynx, initial encounter    Rx / DC Orders ED Discharge Orders    None       Dietrich Pates, Cordelia Poche 09/05/19 2117    Raeford Razor, MD 09/05/19 2152

## 2019-09-05 NOTE — ED Triage Notes (Signed)
Pt brought to ED by [facility] from home for c/o Respiratory distress on cpap, per EMS pt SPO2 87% on 3L [place] when they arrive to his house R-50, BP 133/76, HR 100-180 afib, SPO2 97%. 2 g Mg, 10 Albuterol Neb and 0.5 mg Atrovent, 125 mg Solumedrol given by EMS pta to ED.

## 2019-09-05 NOTE — Discharge Instructions (Addendum)
Follow-up with your primary care provider. Return to the ER for worsening shortness of breath, chest pain, leg swelling or wheezing.

## 2019-09-10 LAB — CULTURE, BLOOD (ROUTINE X 2)
Culture: NO GROWTH
Culture: NO GROWTH
Special Requests: ADEQUATE
Special Requests: ADEQUATE

## 2019-10-20 ENCOUNTER — Institutional Professional Consult (permissible substitution): Payer: Medicare Other | Admitting: Emergency Medicine

## 2019-11-17 ENCOUNTER — Institutional Professional Consult (permissible substitution): Payer: Medicare Other | Admitting: Pulmonary Disease

## 2019-12-10 ENCOUNTER — Encounter (HOSPITAL_COMMUNITY): Payer: Self-pay

## 2019-12-10 ENCOUNTER — Emergency Department (HOSPITAL_COMMUNITY): Payer: Medicare Other

## 2019-12-10 ENCOUNTER — Emergency Department (HOSPITAL_COMMUNITY)
Admission: EM | Admit: 2019-12-10 | Discharge: 2019-12-10 | Disposition: A | Discharge status: Home / Self Care | Payer: Medicare Other | Attending: Emergency Medicine | Admitting: Emergency Medicine

## 2019-12-10 DIAGNOSIS — R001 Bradycardia, unspecified: Secondary | ICD-10-CM

## 2019-12-10 DIAGNOSIS — R1084 Generalized abdominal pain: Secondary | ICD-10-CM

## 2019-12-10 DIAGNOSIS — I10 Essential (primary) hypertension: Secondary | ICD-10-CM | POA: Diagnosis not present

## 2019-12-10 DIAGNOSIS — F17200 Nicotine dependence, unspecified, uncomplicated: Secondary | ICD-10-CM | POA: Diagnosis not present

## 2019-12-10 DIAGNOSIS — J449 Chronic obstructive pulmonary disease, unspecified: Secondary | ICD-10-CM | POA: Insufficient documentation

## 2019-12-10 DIAGNOSIS — J189 Pneumonia, unspecified organism: Secondary | ICD-10-CM | POA: Diagnosis not present

## 2019-12-10 DIAGNOSIS — I2510 Atherosclerotic heart disease of native coronary artery without angina pectoris: Secondary | ICD-10-CM | POA: Diagnosis not present

## 2019-12-10 DIAGNOSIS — Z79899 Other long term (current) drug therapy: Secondary | ICD-10-CM | POA: Insufficient documentation

## 2019-12-10 DIAGNOSIS — R059 Cough, unspecified: Secondary | ICD-10-CM

## 2019-12-10 LAB — COMPREHENSIVE METABOLIC PANEL
ALT: 17 U/L (ref 0–44)
AST: 16 U/L (ref 15–41)
Albumin: 3.7 g/dL (ref 3.5–5.0)
Alkaline Phosphatase: 54 U/L (ref 38–126)
Anion gap: 9 (ref 5–15)
BUN: 9 mg/dL (ref 8–23)
CO2: 28 mmol/L (ref 22–32)
Calcium: 8.8 mg/dL — ABNORMAL LOW (ref 8.9–10.3)
Chloride: 101 mmol/L (ref 98–111)
Creatinine, Ser: 0.95 mg/dL (ref 0.61–1.24)
GFR, Estimated: >60 mL/min (ref >60)
Glucose, Bld: 122 mg/dL — ABNORMAL HIGH (ref 70–99)
Potassium: 4.5 mmol/L (ref 3.5–5.1)
Sodium: 138 mmol/L (ref 135–145)
Total Bilirubin: 0.7 mg/dL (ref 0.3–1.2)
Total Protein: 6.6 g/dL (ref 6.5–8.1)

## 2019-12-10 LAB — URINALYSIS, ROUTINE W REFLEX MICROSCOPIC
Bacteria, UA: NONE SEEN
Bilirubin Urine: NEGATIVE
Glucose, UA: NEGATIVE mg/dL
Ketones, ur: 5 mg/dL — AB
Leukocytes,Ua: NEGATIVE
Nitrite: NEGATIVE
Protein, ur: NEGATIVE mg/dL
Specific Gravity, Urine: 1.016 (ref 1.005–1.030)
pH: 6 (ref 5.0–8.0)

## 2019-12-10 LAB — CBC WITH DIFFERENTIAL/PLATELET
Abs Immature Granulocytes: 0.03 10*3/uL (ref 0.00–0.07)
Basophils Absolute: 0 10*3/uL (ref 0.0–0.1)
Basophils Relative: 0 %
Eosinophils Absolute: 0.1 10*3/uL (ref 0.0–0.5)
Eosinophils Relative: 2 %
HCT: 42.8 % (ref 39.0–52.0)
Hemoglobin: 13.5 g/dL (ref 13.0–17.0)
Immature Granulocytes: 1 %
Lymphocytes Relative: 18 %
Lymphs Abs: 1.1 10*3/uL (ref 0.7–4.0)
MCH: 30.3 pg (ref 26.0–34.0)
MCHC: 31.5 g/dL (ref 30.0–36.0)
MCV: 96 fL (ref 80.0–100.0)
Monocytes Absolute: 0.8 10*3/uL (ref 0.1–1.0)
Monocytes Relative: 13 %
Neutro Abs: 4.2 10*3/uL (ref 1.7–7.7)
Neutrophils Relative %: 66 %
Platelets: 152 10*3/uL (ref 150–400)
RBC: 4.46 MIL/uL (ref 4.22–5.81)
RDW: 13.8 % (ref 11.5–15.5)
WBC: 6.2 10*3/uL (ref 4.0–10.5)
nRBC: 0 % (ref 0.0–0.2)

## 2019-12-10 LAB — LIPASE, BLOOD: Lipase: 23 U/L (ref 11–51)

## 2019-12-10 LAB — TROPONIN I (HIGH SENSITIVITY): Troponin I (High Sensitivity): 3 ng/L (ref <18)

## 2019-12-10 LAB — VALPROIC ACID LEVEL: Valproic Acid Lvl: 37 ug/mL — ABNORMAL LOW (ref 50.0–100.0)

## 2019-12-10 MED ORDER — DOXYCYCLINE HYCLATE 100 MG PO TABS
100.0000 mg | ORAL_TABLET | Freq: Once | ORAL | Status: AC
  Administered 2019-12-10: 100 mg via ORAL
  Filled 2019-12-10: qty 1

## 2019-12-10 MED ORDER — IOHEXOL 300 MG/ML  SOLN
100.0000 mL | Freq: Once | INTRAMUSCULAR | Status: AC | PRN
  Administered 2019-12-10: 100 mL via INTRAVENOUS

## 2019-12-10 MED ORDER — MORPHINE SULFATE (PF) 2 MG/ML IV SOLN
2.0000 mg | Freq: Once | INTRAVENOUS | Status: AC
  Administered 2019-12-10: 2 mg via INTRAVENOUS
  Filled 2019-12-10: qty 1

## 2019-12-10 MED ORDER — DOXYCYCLINE HYCLATE 100 MG PO CAPS: 100.0000 mg | ORAL_CAPSULE | Freq: Two times a day (BID) | ORAL | 0 refills | Status: AC

## 2019-12-10 MED ORDER — ONDANSETRON HCL 4 MG/2ML IJ SOLN
4.0000 mg | Freq: Once | INTRAMUSCULAR | Status: AC
  Administered 2019-12-10: 4 mg via INTRAVENOUS
  Filled 2019-12-10: qty 2

## 2019-12-10 MED ORDER — SODIUM CHLORIDE (PF) 0.9 % IJ SOLN
INTRAMUSCULAR | Status: AC
  Filled 2019-12-10: qty 50

## 2019-12-10 NOTE — ED Notes (Signed)
Attempted to call facility, was unable to reach anyone.

## 2019-12-10 NOTE — ED Notes (Signed)
PA made aware of heart rate dropping intermittently into the 30's and 40's.

## 2019-12-10 NOTE — Discharge Instructions (Signed)
Your work-up today was reassuring.   Your heart rate intermittently was getting slower.  This may be because your medication.  You will now cut your metoprolol to 25 mg a day.  We will plan to treat your cough as this could be developing pneumonia.  Please take antibiotic as directed.  Return to emergency department for any worsening pain, vomiting, fever or any other worsening concerning symptoms.

## 2019-12-10 NOTE — ED Triage Notes (Signed)
Pt reports losing sensation of his legs and was unable to control his bladder. States this has been an on going problem. Last occurrence was last night.

## 2019-12-10 NOTE — ED Provider Notes (Signed)
[facility]-EMERGENCY DEPT Provider Note   CSN: [id number] Arrival date & time: 12/10/19  1202     History Chief Complaint  Patient presents with  . Abdominal Pain  . Nausea  . Vomiting    Johnny Rodriguez is a 69 y.o. male.  Past history of A. fib, alcohol abuse, CAD, COPD, CAD, hypertension who presents for evaluation of abdominal pain, distention, nausea/vomiting.  He states that heh as had abdominal pain and swelling for the last 8 months or so. He feels like it will get better and then flare up again.  He feels like it is worse in the last few days.  He states last night, he started having vomiting estimates he has had 5-6 episodes of vomiting.  He has not seen any blood.  He states he will intermittently have dry heaves.  He feels like his abdomen is more swollen.  He states his last bowel movement was this morning but was small.  He states that one time, he had to get "cleaned out" and he feels like he needs that again.  He also feels like his throat has been hurting.  He has been able to tolerate his saliva.  He feels like throat started hurting after his vomiting.  He has not noted any fevers. He denies any CP, SOB. He has a chronic cough that is normal for him. He has a history of COPD and is on 3L of O2 at baseline. No recent increases in oxygen use.   The history is provided by the patient.       Past Medical History:  Diagnosis Date  . AF (atrial fibrillation) (HCC)   . Alcohol abuse   . Angina pectoris   . CAD (coronary artery disease) 12/15/2004  . COPD (chronic obstructive pulmonary disease) (HCC)   . Coronary artery disease   . Heavy cigarette smoker   . HTN (hypertension)   . Idiopathic cardiomyopathy (HCC) 07/16/2009  . Non compliance with medical treatment   . Seizures [facility])     Patient Active Problem List   Diagnosis Date Noted  . COPD with acute exacerbation (HCC) 03/11/2019  . AKI (acute kidney injury) (HCC) 03/10/2019  . Bowel  obstruction (HCC) 03/10/2019  . Chronic respiratory failure with hypoxia (HCC) 10/15/2016  . Cigarette smoker 09/09/2016  . Long term current use of anticoagulant therapy 06/24/2016  . History of syncope 06/24/2016  . COPD exacerbation (HCC) 02/08/2015  . Essential hypertension 02/08/2015  . Syncope 12/10/2011  . History of noncompliance with medical treatment 12/01/2011  . Chronic systolic heart failure (HCC) 04/10/2011  . Idiopathic cardiomyopathy (HCC) 07/16/2009  . Paroxysmal atrial fibrillation (HCC) 07/16/2009  . COPD PFT's pending  07/16/2009  . CAD (coronary artery disease) 12/15/2004    Past Surgical History:  Procedure Laterality Date  . CARDIAC CATHETERIZATION  2006  . CARDIAC CATHETERIZATION  12/2004   Left   . CARDIOVERSION  01/21/2011   Procedure: CARDIOVERSION;  Surgeon: Darden Palmer., MD;  Location: [facility] OR;  Service: Cardiovascular;  Laterality: N/A;  . DOPPLER ECHOCARDIOGRAPHY  02/2011  . ELECTROPHYSIOLOGY STUDY N/A 12/16/2011   Procedure: ELECTROPHYSIOLOGY STUDY;  Surgeon: Marinus Maw, MD;  Location: [facility] CATH LAB;  Service: Cardiovascular;  Laterality: N/A;  . EP IMPLANTABLE DEVICE N/A 08/24/2015   Procedure: Loop Recorder Removal;  Surgeon: Marinus Maw, MD;  Location: [facility] INVASIVE CV LAB;  Service: Cardiovascular;  Laterality: N/A;  . fx collar bone    . NO  PAST SURGERIES         Family History  Problem Relation Age of Onset  . Suicidality Sister   . CVA Mother   . Stroke Mother   . Heart disease Father   . Coronary artery disease Father   . COPD Father     Social History   Tobacco Use  . Smoking status: Current Every Day Smoker    Packs/day: 1.00    Years: 58.00    Pack years: 58.00  . Smokeless tobacco: Never Used  . Tobacco comment: pt states he is down to 1ppd  Vaping Use  . Vaping Use: Never used  Substance Use Topics  . Alcohol use: Yes    Alcohol/week: 3.0 standard drinks    Types: 3 Cans of beer per week    Comment: EtOH use  diorder - liquor  . Drug use: No    Home Medications Prior to Admission medications   Medication Sig Start Date End Date Taking? Authorizing Provider  albuterol (PROAIR HFA) 108 (90 Base) MCG/ACT inhaler Inhale 1-2 puffs into the lungs every 6 (six) hours as needed for wheezing or shortness of breath. 08/10/19  Yes Graciella Freer, PA-C  cyclobenzaprine (FLEXERIL) 5 MG tablet Take 5 mg by mouth 2 (two) times daily as needed for muscle spasms.  11/03/19  Yes [provider]  dabigatran (PRADAXA) 150 MG CAPS capsule Take 1 capsule (150 mg total) by mouth 2 (two) times daily. Medicaid [id number] O Patient taking differently: Take 150 mg by mouth 2 (two) times daily.  02/09/19  Yes Benjiman Core, MD  diltiazem (CARDIZEM CD) 240 MG 24 hr capsule Take 1 capsule (240 mg total) by mouth daily. Medicaid [id number] O Patient taking differently: Take 240 mg by mouth daily.  02/09/19 12/10/19 Yes Benjiman Core, MD  diphenhydrAMINE (BANOPHEN) 25 MG tablet Take 25 mg by mouth daily.   Yes [provider]  divalproex (DEPAKOTE SPRINKLE) 125 MG capsule Take 125 mg by mouth 3 (three) times daily.   Yes [provider]  FLUoxetine (PROZAC) 10 MG capsule Take 10 mg by mouth daily.   Yes [provider]  fluticasone furoate-vilanterol (BREO ELLIPTA) 100-25 MCG/INH AEPB Inhale 1 puff into the lungs daily.   Yes [provider]  HYDROcodone-acetaminophen (NORCO/VICODIN) 5-325 MG tablet Take 1 tablet by mouth 3 (three) times daily as needed for moderate pain.  11/10/19  Yes [provider]  Lactobacillus (PROBIOTIC ACIDOPHILUS PO) Take 1 tablet by mouth in the morning and at bedtime.   Yes [provider]  losartan (COZAAR) 25 MG tablet Take 1 tablet (25 mg total) by mouth daily. Medicaid [id number] O Patient taking differently: Take 25 mg by mouth daily.  02/09/19 12/10/19 Yes Benjiman Core, MD  metoprolol succinate (TOPROL-XL) 50 MG 24 hr  tablet Take 1 tablet (50 mg total) by mouth daily. Take with or immediately following a meal. Medicaid [id number] O Patient taking differently: Take 50 mg by mouth daily.  05/09/19  Yes Marinus Maw, MD  OXYGEN Inhale 3 L into the lungs continuous.    Yes [provider]  polyethylene glycol (MIRALAX / GLYCOLAX) 17 g packet Take 17 g by mouth daily. Patient taking differently: Take 17 g by mouth daily as needed for mild constipation.  03/15/19  Yes Dellia Cloud, MD  predniSONE (DELTASONE) 10 MG tablet Take 10 mg by mouth daily with breakfast.   Yes [provider]  senna (SENOKOT) 8.6 MG TABS tablet Take 1-2 tablets (8.6-17.2 mg total) by  mouth daily. Patient taking differently: Take 1-2 tablets by mouth daily as needed for mild constipation.  03/15/19  Yes Dellia Cloud, MD  simvastatin (ZOCOR) 20 MG tablet Take 20 mg by mouth daily.  06/04/19  Yes [provider]  spironolactone (ALDACTONE) 25 MG tablet Take 1 tablet (25 mg total) by mouth daily. 08/10/19  Yes Graciella Freer, PA-C  tiotropium (SPIRIVA) 18 MCG inhalation capsule Place 1 capsule (18 mcg total) into inhaler and inhale daily. 08/10/19  Yes Graciella Freer, PA-C  traZODone (DESYREL) 50 MG tablet Take 50 mg by mouth at bedtime.   Yes [provider]  vitamin E 45 MG (100 UNITS) capsule Take 100 Units by mouth daily.   Yes [provider]  doxycycline (VIBRAMYCIN) 100 MG capsule Take 1 capsule (100 mg total) by mouth 2 (two) times daily for 7 days. 12/10/19 12/17/19  Maxwell Caul, PA-C  folic acid (FOLVITE) 1 MG tablet Take 1 tablet (1 mg total) by mouth daily. Patient not taking: Reported on 12/10/2019 04/04/17   Elease Etienne, MD  furosemide (LASIX) 40 MG tablet Take 1 tablet (40 mg total) by mouth daily. Medicaid [id number] O Patient not taking: Reported on 12/10/2019 08/10/19   Graciella Freer, PA-C  mometasone-formoterol [facility]) 200-5 MCG/ACT AERO Inhale 2 puffs  into the lungs 2 (two) times daily. Medicaid [id number] O Patient not taking: Reported on 12/10/2019 08/10/19   Graciella Freer, PA-C  Multiple Vitamin (MULTIVITAMIN WITH MINERALS) TABS tablet Take 1 tablet by mouth daily. Patient not taking: Reported on 12/10/2019 04/04/17   Elease Etienne, MD  thiamine 100 MG tablet Take 1 tablet (100 mg total) by mouth daily. Patient not taking: Reported on 12/10/2019 04/04/17   Elease Etienne, MD    Allergies    Patient has no known allergies.  Review of Systems   Review of Systems  Constitutional: Negative for fever.  Respiratory: Positive for cough. Negative for shortness of breath.   Cardiovascular: Negative for chest pain.  Gastrointestinal: Positive for abdominal distention, abdominal pain, nausea and vomiting. Negative for diarrhea.  Genitourinary: Negative for dysuria and hematuria.  Neurological: Negative for headaches.  All other systems reviewed and are negative.   Physical Exam Updated Vital Signs BP 104/90   Pulse (!) 51   Temp 97.6 F (36.4 C) (Oral)   Resp 18   Ht  (1.803 m)   Wt 68 kg   SpO2 93%   BMI 20.92 kg/m   Physical Exam Vitals and nursing note reviewed.  Constitutional:      Appearance: Normal appearance. He is well-developed.     Comments: NAD  HENT:     Head: Normocephalic and atraumatic.  Eyes:     General: Lids are normal.     Conjunctiva/sclera: Conjunctivae normal.     Pupils: Pupils are equal, round, and reactive to light.  Cardiovascular:     Rate and Rhythm: Normal rate and regular rhythm.     Pulses: Normal pulses.          Radial pulses are 2+ on the right side and 2+ on the left side.       Dorsalis pedis pulses are 2+ on the right side and 2+ on the left side.     Heart sounds: Normal heart sounds. No murmur heard.  No friction rub. No gallop.   Pulmonary:     Effort: Pulmonary effort is normal.     Breath sounds: Examination of the right-lower field reveals  rales. Rales  present.     Comments: Lungs clear to auscultation bilaterally.  Symmetric chest rise.  No wheezing, rales, rhonchi. Abdominal:     General: Bowel sounds are normal.     Palpations: Abdomen is soft. Abdomen is not rigid.     Tenderness: There is generalized abdominal tenderness. There is no guarding.     Comments: Abdomen is protuberant. No fluid wave. Generalized tenderness noted diffusely with no focal point. No rigidity or guarding.   Musculoskeletal:        General: Normal range of motion.     Cervical back: Full passive range of motion without pain.  Skin:    General: Skin is warm and dry.     Capillary Refill: Capillary refill takes less than 2 seconds.  Neurological:     Mental Status: He is alert and oriented to person, place, and time.     Comments: Follows commands, Moves all extremities  5/5 strength to BUE and BLE  Sensation intact throughout all major nerve distributions  Psychiatric:        Speech: Speech normal.     ED Results / Procedures / Treatments   Labs (all labs ordered are listed, but only abnormal results are displayed) Labs Reviewed  COMPREHENSIVE METABOLIC PANEL - Abnormal; Notable for the following components:      Result Value   Glucose, Bld 122 (*)    Calcium 8.8 (*)    All other components within normal limits  URINALYSIS, ROUTINE W REFLEX MICROSCOPIC - Abnormal; Notable for the following components:   Hgb urine dipstick SMALL (*)    Ketones, ur 5 (*)    All other components within normal limits  VALPROIC ACID LEVEL - Abnormal; Notable for the following components:   Valproic Acid Lvl 37 (*)    All other components within normal limits  CBC WITH DIFFERENTIAL/PLATELET  LIPASE, BLOOD  TROPONIN I (HIGH SENSITIVITY)    EKG None  Radiology CT ABDOMEN PELVIS W CONTRAST  Result Date: 12/10/2019 CLINICAL DATA:  Nausea vomiting and lower abdominal pain. EXAM: CT ABDOMEN AND PELVIS WITH CONTRAST TECHNIQUE: Multidetector CT imaging of the abdomen  and pelvis was performed using the standard protocol following bolus administration of intravenous contrast. CONTRAST:  OMNIPAQUE IOHEXOL 300 MG/ML  SOLN COMPARISON:  CT of the abdomen and pelvis Jun 15, 2019 FINDINGS: Lower chest: No acute abnormality. Hepatobiliary: No focal liver abnormality is seen. No gallstones, gallbladder wall thickening, or biliary dilatation. Pancreas: Unremarkable. No pancreatic ductal dilatation or surrounding inflammatory changes. Spleen: Normal in size without focal abnormality. Adrenals/Urinary Tract: Mild bilateral adrenal thickening, stable. Normal appearance of the kidneys and urinary bladder Stomach/Bowel: Stomach is within normal limits. Appendix appears normal. No evidence of bowel wall thickening, distention, or inflammatory changes. Vascular/Lymphatic: Aortic atherosclerosis. No enlarged abdominal or pelvic lymph nodes. Reproductive: Mild enlargement of the prostate gland. Other: No abdominal wall hernia or abnormality. No abdominopelvic ascites. Musculoskeletal: No acute or significant osseous findings. IMPRESSION: 1. No evidence of acute abnormalities within the abdomen or pelvis. 2. Mild enlargement of the prostate gland. Please correlate to serum PSA values. 3. Aortic atherosclerosis. Aortic Atherosclerosis (ICD10-I70.0). Electronically Signed   By: Ted Mcalpine M.D.   On: 12/10/2019 15:36    Procedures Procedures (including critical care time)  Medications Ordered in ED Medications  sodium chloride (PF) 0.9 % injection (has no administration in time range)  iohexol (OMNIPAQUE) 300 MG/ML solution 100 mL (100 mLs Intravenous Contrast Given 12/10/19 1517)  ondansetron (ZOFRAN)  injection 4 mg (4 mg Intravenous Given 12/10/19 1605)  morphine 2 MG/ML injection 2 mg (2 mg Intravenous Given 12/10/19 1605)  doxycycline (VIBRA-TABS) tablet 100 mg (100 mg Oral Given 12/10/19 2017)    ED Course  I have reviewed the triage vital signs and the nursing  notes.  Pertinent labs & imaging results that were available during my care of the patient were reviewed by me and considered in my medical decision making (see chart for details).    MDM Rules/Calculators/A&P                          69 year old male who presents for evaluation abdominal pain, nausea/vomiting.  He states he has had abdominal pain and swelling that has been ongoing for several months.  He states his last bowel movement was this morning.  He states last time he got like this "he had to be cleaned out.." He also reports sore throat after he started vomiting.  No fevers.  On initial arrival, he is afebrile nontoxic-appearing.  Vital signs are stable.  On exam, he has a protuberant abdomen but with good bowel sounds.  He has diffuse tenderness.  No focal point.  We will plan to check labs, imaging to ensure there is no obstruction that would be causing his symptoms though given that he has been able to have a bowel movement, this is less likely.  History/physical exam not concerning for SBP, appendicitis, diverticulitis, mesenteric ischemia.  RN inform me that patient's heart rate was fluctuating into the high 30s, low 40s.  He will occasionally have drops and then go back to the 50s and 60s.  Review his medication shows that he is on diltiazem as well as metoprolol for A. Fib.  We will add on Depakote level as well as EKG, trop.  Depakote level is 37 which is subtherapeutic.  Theodis Aguas is negative.  Lipase unremarkable.  CBC shows no leukocytosis or anemia.  CMP is normal.  UA shows no evidence of infectious etiology.  CT ab pelvis shows no evidence of acute abnormalities within the abdomen pelvis.  Mild large for the prostate given.  No other acute abnormalities.  Patient sleeping comfortably on reevaluation.  Repeat abdominal exam shows improved tenderness now he has some tenderness into the upper abdomen.  He has not vomited since being here in the ED.  We will plan to p.o. challenge  patient.  Patient does report he had an episode of pain when he was first coming in and that caused him to feel like he was weak and numb in his legs.  He states that resolved.  On my evaluation, he has symmetric strength and has been able to move and ambulate in the ED.  Not feel that this represents acute neurologic condition.  No indication for emergent MRI. Discussed patient with Dr. Donnald Garre who is agreeable to plan.   Patient able to tolerate p.o.  His heart rate has remained consistently between 50, 88 over the last few hours.  Repeat abdominal exam is improved.  I discussed with him regarding cutting down on his metoprolol given intermittent bradycardia here in the ED.  Additionally, his Depakote level is subtherapeutic do not feel that this is interfering.  We will plan to put him on doxycycline for cough and questionable rales heard on exam.  He does have history of COPD so question COPD exacerbation.  At this time, he is not hypoxic and has not had to  increase his normal level of oxygen.  Patient instructed to follow-up with primary care doctor. At this time, patient exhibits no emergent life-threatening condition that require further evaluation in ED. Patient had ample opportunity for questions and discussion. All patient's questions were answered with full understanding. Strict return precautions discussed. Patient expresses understanding and agreement to plan.   Portions of this note were generated with Scientist, clinical (histocompatibility and immunogenetics). Dictation errors may occur despite best attempts at proofreading.    Final Clinical Impression(s) / ED Diagnoses Final diagnoses:  Generalized abdominal pain  Cough  Community acquired pneumonia of right lung, unspecified part of lung  Bradycardia    Rx / DC Orders ED Discharge Orders         Ordered    doxycycline (VIBRAMYCIN) 100 MG capsule  2 times daily        12/10/19 2002           Rosana Hoes 12/10/19 2045    Arby Barrette,  MD 12/11/19 1430

## 2019-12-10 NOTE — ED Provider Notes (Signed)
Medical screening examination/treatment/procedure(s) were conducted as a shared visit with non-physician practitioner(s) and myself.  I personally evaluated the patient during the encounter.    Patient has been having waxing and waning abdominal pain.  Yesterday evening he had 5 or 6 episodes of vomiting.  Abdomen feels more distended and uncomfortable.  Pain seems to be migrating more to the right lateral abdomen.  Patient is alert with clear mental status.  No confusion.  Lungs grossly clear.  No respiratory distress.  Heart regular.  Abdomen is protuberant.  Discomfort to the right lateral abdomen and right lateral chest wall.  No rashes or appearance of soft tissue abnormality.  Patient is abdominal pain has been longstanding but was worse yesterday.  It has a migratory quality and ultimately more so in the right upper quadrant and right lateral chest.  Pain was very reproducible on the chest wall as well.  Have some suspicion of pleurisy.  CT does not show acute findings.  I have personally reviewed the CT and find there is some appearance of irregular infiltrate in the base of the right lung posteriorly.  This does coordinate with some of the patient's description of both pleuritic and flank\upper abdomen discomfort.  The correlation with CT findings and patient symptoms will opt to treat for community-acquired pneumonia with doxycycline.   Patient several other chronic complaints to review.  He has concern for a old fracture which appears to be displaced clavicle fracture with bony prominence over the left posterior shoulder\trapezius area.  This is well-healed and an old injury.  He has good function of the arm.  He does describe paresthesia.  Patient is counseled to have a recheck with orthopedics regarding this finding.   Arby Barrette, MD 12/11/19 1435

## 2019-12-10 NOTE — ED Notes (Signed)
[facility] called  

## 2019-12-10 NOTE — ED Triage Notes (Signed)
Patient presents from [facility] with multiple complaints. Last night the patient began to have throat discomfort. He describes it as feeling as if something sharp is in his throat causing irritation. The irritation causes nausea and vomiting. In fact the patient has vomited 3 times in the past 24 hours. He currently has dry heaves intermittently. He has also had a distended abdomin for some time now with abdominal pain in the upper and lower quadrants. He has a sprained left arm for which he wears a sling. The left shoulder also is in pain.    EMS vitals: 118/76 94 HR AFib 3L chronic 95% SPO2 on nasal canula

## 2020-06-05 ENCOUNTER — Inpatient Hospital Stay (HOSPITAL_COMMUNITY)
Admission: EM | Admit: 2020-06-05 | Discharge: 2020-06-07 | DRG: 308 | Disposition: A | Discharge status: Home Health | Payer: Medicare Other | Attending: Family Medicine | Admitting: Family Medicine

## 2020-06-05 ENCOUNTER — Emergency Department (HOSPITAL_COMMUNITY): Payer: Medicare Other

## 2020-06-05 ENCOUNTER — Encounter (HOSPITAL_COMMUNITY): Payer: Self-pay | Admitting: Family Medicine

## 2020-06-05 ENCOUNTER — Other Ambulatory Visit: Payer: Self-pay

## 2020-06-05 DIAGNOSIS — Z79899 Other long term (current) drug therapy: Secondary | ICD-10-CM

## 2020-06-05 DIAGNOSIS — I5033 Acute on chronic diastolic (congestive) heart failure: Secondary | ICD-10-CM | POA: Diagnosis present

## 2020-06-05 DIAGNOSIS — R5381 Other malaise: Secondary | ICD-10-CM | POA: Diagnosis present

## 2020-06-05 DIAGNOSIS — Z9981 Dependence on supplemental oxygen: Secondary | ICD-10-CM

## 2020-06-05 DIAGNOSIS — Z7952 Long term (current) use of systemic steroids: Secondary | ICD-10-CM

## 2020-06-05 DIAGNOSIS — Z823 Family history of stroke: Secondary | ICD-10-CM | POA: Diagnosis not present

## 2020-06-05 DIAGNOSIS — I480 Paroxysmal atrial fibrillation: Secondary | ICD-10-CM | POA: Diagnosis present

## 2020-06-05 DIAGNOSIS — Z7951 Long term (current) use of inhaled steroids: Secondary | ICD-10-CM

## 2020-06-05 DIAGNOSIS — J449 Chronic obstructive pulmonary disease, unspecified: Secondary | ICD-10-CM

## 2020-06-05 DIAGNOSIS — I110 Hypertensive heart disease with heart failure: Secondary | ICD-10-CM | POA: Diagnosis present

## 2020-06-05 DIAGNOSIS — Z7901 Long term (current) use of anticoagulants: Secondary | ICD-10-CM

## 2020-06-05 DIAGNOSIS — I2510 Atherosclerotic heart disease of native coronary artery without angina pectoris: Secondary | ICD-10-CM | POA: Diagnosis present

## 2020-06-05 DIAGNOSIS — J9611 Chronic respiratory failure with hypoxia: Secondary | ICD-10-CM

## 2020-06-05 DIAGNOSIS — E876 Hypokalemia: Secondary | ICD-10-CM

## 2020-06-05 DIAGNOSIS — Z20822 Contact with and (suspected) exposure to covid-19: Secondary | ICD-10-CM | POA: Diagnosis present

## 2020-06-05 DIAGNOSIS — I429 Cardiomyopathy, unspecified: Secondary | ICD-10-CM | POA: Diagnosis present

## 2020-06-05 DIAGNOSIS — Z9114 Patient's other noncompliance with medication regimen: Secondary | ICD-10-CM

## 2020-06-05 DIAGNOSIS — I4891 Unspecified atrial fibrillation: Secondary | ICD-10-CM

## 2020-06-05 DIAGNOSIS — Z825 Family history of asthma and other chronic lower respiratory diseases: Secondary | ICD-10-CM | POA: Diagnosis not present

## 2020-06-05 DIAGNOSIS — J439 Emphysema, unspecified: Secondary | ICD-10-CM | POA: Diagnosis present

## 2020-06-05 DIAGNOSIS — D649 Anemia, unspecified: Secondary | ICD-10-CM | POA: Diagnosis present

## 2020-06-05 DIAGNOSIS — F17210 Nicotine dependence, cigarettes, uncomplicated: Secondary | ICD-10-CM | POA: Diagnosis present

## 2020-06-05 DIAGNOSIS — Z8249 Family history of ischemic heart disease and other diseases of the circulatory system: Secondary | ICD-10-CM

## 2020-06-05 DIAGNOSIS — I428 Other cardiomyopathies: Secondary | ICD-10-CM

## 2020-06-05 DIAGNOSIS — I10 Essential (primary) hypertension: Secondary | ICD-10-CM

## 2020-06-05 DIAGNOSIS — D696 Thrombocytopenia, unspecified: Secondary | ICD-10-CM | POA: Diagnosis present

## 2020-06-05 DIAGNOSIS — I5022 Chronic systolic (congestive) heart failure: Secondary | ICD-10-CM

## 2020-06-05 DIAGNOSIS — J441 Chronic obstructive pulmonary disease with (acute) exacerbation: Secondary | ICD-10-CM

## 2020-06-05 LAB — COMPREHENSIVE METABOLIC PANEL
ALT: 13 U/L (ref 0–44)
AST: 20 U/L (ref 15–41)
Albumin: 2.4 g/dL — ABNORMAL LOW (ref 3.5–5.0)
Alkaline Phosphatase: 35 U/L — ABNORMAL LOW (ref 38–126)
Anion gap: 6 (ref 5–15)
BUN: 7 mg/dL — ABNORMAL LOW (ref 8–23)
CO2: 25 mmol/L (ref 22–32)
Calcium: 5.9 mg/dL — CL (ref 8.9–10.3)
Chloride: 108 mmol/L (ref 98–111)
Creatinine, Ser: 0.63 mg/dL (ref 0.61–1.24)
GFR, Estimated: >60 mL/min (ref >60)
Glucose, Bld: 110 mg/dL — ABNORMAL HIGH (ref 70–99)
Potassium: 2.9 mmol/L — ABNORMAL LOW (ref 3.5–5.1)
Sodium: 139 mmol/L (ref 135–145)
Total Bilirubin: 0.8 mg/dL (ref 0.3–1.2)
Total Protein: 4.1 g/dL — ABNORMAL LOW (ref 6.5–8.1)

## 2020-06-05 LAB — CBC WITH DIFFERENTIAL/PLATELET
Abs Immature Granulocytes: 0.02 10*3/uL (ref 0.00–0.07)
Basophils Absolute: 0 10*3/uL (ref 0.0–0.1)
Basophils Relative: 0 %
Eosinophils Absolute: 0.1 10*3/uL (ref 0.0–0.5)
Eosinophils Relative: 1 %
HCT: 32.8 % — ABNORMAL LOW (ref 39.0–52.0)
Hemoglobin: 10 g/dL — ABNORMAL LOW (ref 13.0–17.0)
Immature Granulocytes: 0 %
Lymphocytes Relative: 10 %
Lymphs Abs: 0.5 10*3/uL — ABNORMAL LOW (ref 0.7–4.0)
MCH: 30.3 pg (ref 26.0–34.0)
MCHC: 30.5 g/dL (ref 30.0–36.0)
MCV: 99.4 fL (ref 80.0–100.0)
Monocytes Absolute: 0.2 10*3/uL (ref 0.1–1.0)
Monocytes Relative: 4 %
Neutro Abs: 4 10*3/uL (ref 1.7–7.7)
Neutrophils Relative %: 85 %
Platelets: 144 10*3/uL — ABNORMAL LOW (ref 150–400)
RBC: 3.3 MIL/uL — ABNORMAL LOW (ref 4.22–5.81)
RDW: 12.3 % (ref 11.5–15.5)
WBC: 4.8 10*3/uL (ref 4.0–10.5)
nRBC: 0 % (ref 0.0–0.2)

## 2020-06-05 LAB — URINALYSIS, ROUTINE W REFLEX MICROSCOPIC
Bacteria, UA: NONE SEEN
Bilirubin Urine: NEGATIVE
Glucose, UA: NEGATIVE mg/dL
Ketones, ur: 5 mg/dL — AB
Nitrite: NEGATIVE
Protein, ur: 30 mg/dL — AB
Specific Gravity, Urine: 1.031 — ABNORMAL HIGH (ref 1.005–1.030)
pH: 5 (ref 5.0–8.0)

## 2020-06-05 LAB — BRAIN NATRIURETIC PEPTIDE: B Natriuretic Peptide: 13.7 pg/mL (ref 0.0–100.0)

## 2020-06-05 LAB — LIPASE, BLOOD: Lipase: 25 U/L (ref 11–51)

## 2020-06-05 LAB — D-DIMER, QUANTITATIVE: D-Dimer, Quant: 0.28 ug/mL-FEU (ref 0.00–0.50)

## 2020-06-05 LAB — TROPONIN I (HIGH SENSITIVITY): Troponin I (High Sensitivity): 4 ng/L (ref <18)

## 2020-06-05 MED ORDER — LEVALBUTEROL TARTRATE 45 MCG/ACT IN AERO
2.0000 | INHALATION_SPRAY | Freq: Four times a day (QID) | RESPIRATORY_TRACT | Status: DC | PRN
  Filled 2020-06-05: qty 15

## 2020-06-05 MED ORDER — HYDROCODONE-ACETAMINOPHEN 5-325 MG PO TABS
1.0000 | ORAL_TABLET | ORAL | Status: DC | PRN
  Administered 2020-06-06: 1 via ORAL
  Filled 2020-06-05: qty 1

## 2020-06-05 MED ORDER — MAGNESIUM SULFATE IN D5W 1-5 GM/100ML-% IV SOLN
1.0000 g | Freq: Once | INTRAVENOUS | Status: DC
  Filled 2020-06-05: qty 100

## 2020-06-05 MED ORDER — METOPROLOL TARTRATE 5 MG/5ML IV SOLN: 5.0000 mg | INTRAVENOUS | Status: DC | PRN

## 2020-06-05 MED ORDER — METOPROLOL TARTRATE 5 MG/5ML IV SOLN
5.0000 mg | Freq: Once | INTRAVENOUS | Status: AC
  Administered 2020-06-05: 5 mg via INTRAVENOUS
  Filled 2020-06-05: qty 5

## 2020-06-05 MED ORDER — SENNOSIDES-DOCUSATE SODIUM 8.6-50 MG PO TABS: 1.0000 | ORAL_TABLET | Freq: Every evening | ORAL | Status: DC | PRN

## 2020-06-05 MED ORDER — ACETAMINOPHEN 325 MG PO TABS: 650.0000 mg | ORAL_TABLET | Freq: Four times a day (QID) | ORAL | Status: DC | PRN

## 2020-06-05 MED ORDER — ONDANSETRON HCL 4 MG PO TABS: 4.0000 mg | ORAL_TABLET | Freq: Four times a day (QID) | ORAL | Status: DC | PRN

## 2020-06-05 MED ORDER — DILTIAZEM HCL-DEXTROSE 125-5 MG/125ML-% IV SOLN (PREMIX): 5.0000 mg/h | INTRAVENOUS | Status: DC

## 2020-06-05 MED ORDER — SODIUM CHLORIDE 0.9 % IV SOLN: 250.0000 mL | INTRAVENOUS | Status: DC | PRN

## 2020-06-05 MED ORDER — POTASSIUM CHLORIDE CRYS ER 20 MEQ PO TBCR
40.0000 meq | EXTENDED_RELEASE_TABLET | Freq: Once | ORAL | Status: AC
  Administered 2020-06-05: 40 meq via ORAL
  Filled 2020-06-05: qty 2

## 2020-06-05 MED ORDER — DILTIAZEM LOAD VIA INFUSION
15.0000 mg | Freq: Once | INTRAVENOUS | Status: DC
  Filled 2020-06-05: qty 15

## 2020-06-05 MED ORDER — ACETAMINOPHEN 650 MG RE SUPP: 650.0000 mg | Freq: Four times a day (QID) | RECTAL | Status: DC | PRN

## 2020-06-05 MED ORDER — ONDANSETRON HCL 4 MG/2ML IJ SOLN: 4.0000 mg | Freq: Four times a day (QID) | INTRAMUSCULAR | Status: DC | PRN

## 2020-06-05 MED ORDER — POTASSIUM CHLORIDE 10 MEQ/100ML IV SOLN
10.0000 meq | INTRAVENOUS | Status: AC
  Administered 2020-06-06: 10 meq via INTRAVENOUS
  Filled 2020-06-05: qty 100

## 2020-06-05 MED ORDER — DILTIAZEM HCL 25 MG/5ML IV SOLN: 15.0000 mg | Freq: Once | INTRAVENOUS | Status: DC

## 2020-06-05 MED ORDER — CALCIUM GLUCONATE-NACL 1-0.675 GM/50ML-% IV SOLN
1.0000 g | Freq: Once | INTRAVENOUS | Status: AC
  Administered 2020-06-05: 1000 mg via INTRAVENOUS
  Filled 2020-06-05: qty 50

## 2020-06-05 NOTE — H&P (Signed)
History and Physical    Qadry Belmonte POL:[id number] DOB: [birth year] DOA: 06/05/2020  PCP: System, Provider Not In   Patient coming from: Home   Chief Complaint: Exertional dyspnea, palpitations, leg swelling   HPI: Johnny Rodriguez is a 70 y.o. male with medical history significant for COPD, chronic hypoxic respiratory failure, cardiomyopathy, coronary artery disease, and hypertension, now presenting to the emergency department for evaluation of exertional dyspnea, leg swelling, and palpitations.  Patient reports that he has been out of all of his medications for 6 or 7 months, was doing well initially, but has recently developed progressive lower extremity edema, decreasing exercise tolerance, and more recently palpitations and exertional dyspnea.  Patient reports that he has been able to walk more than a few steps without stopping to catch his breath the past couple days and has had rapid palpitations.  He denies any change in his chronic cough, denies fevers or chills, and denies chest pain.  Reports that he is significantly cut back on his cigarette smoking and smokes approximately 1 cigarette daily now.  He reports only very rare alcohol use.  He denies any seizures.  ED Course: Upon arrival to the ED, patient is found to be afebrile, saturating mid 90s on 3 L/min of supplemental oxygen, tachypneic, and tachycardic to the 130s.  EKG features atrial fibrillation with RVR, rate 127.  Chest x-ray notable for emphysematous changes and fibrosis without acute finding.  Chemistry panel concerning for potassium 2.9, albumin 2.4, and calcium 5.9.  CBC features a normocytic anemia and slight thrombocytopenia.  Patient was given IV metoprolol, potassium, and calcium in the ED.  Review of Systems:  All other systems reviewed and apart from HPI, are negative.  Past Medical History:  Diagnosis Date  . AF (atrial fibrillation) (HCC)   . Alcohol abuse   . Angina pectoris   . CAD (coronary artery  disease) 12/15/2004  . COPD (chronic obstructive pulmonary disease) (HCC)   . Coronary artery disease   . Heavy cigarette smoker   . HTN (hypertension)   . Idiopathic cardiomyopathy (HCC) 07/16/2009  . Non compliance with medical treatment   . Seizures (HCC)     Past Surgical History:  Procedure Laterality Date  . CARDIAC CATHETERIZATION  2006  . CARDIAC CATHETERIZATION  12/2004   Left   . CARDIOVERSION  01/21/2011   Procedure: CARDIOVERSION;  Surgeon: Darden Palmer., MD;  Location: [facility] OR;  Service: Cardiovascular;  Laterality: N/A;  . DOPPLER ECHOCARDIOGRAPHY  02/2011  . ELECTROPHYSIOLOGY STUDY N/A 12/16/2011   Procedure: ELECTROPHYSIOLOGY STUDY;  Surgeon: Marinus Maw, MD;  Location: [facility] CATH LAB;  Service: Cardiovascular;  Laterality: N/A;  . EP IMPLANTABLE DEVICE N/A 08/24/2015   Procedure: Loop Recorder Removal;  Surgeon: Marinus Maw, MD;  Location: [facility] INVASIVE CV LAB;  Service: Cardiovascular;  Laterality: N/A;  . fx collar bone    . NO PAST SURGERIES      Social History:   reports that he has been smoking. He has a 58.00 pack-year smoking history. He has never used smokeless tobacco. He reports current alcohol use of about 3.0 standard drinks of alcohol per week. He reports that he does not use drugs.  No Known Allergies  Family History  Problem Relation Age of Onset  . Suicidality Sister   . CVA Mother   . Stroke Mother   . Heart disease Father   . Coronary artery disease Father   . COPD Father  Prior to Admission medications   Medication Sig Start Date End Date Taking? Authorizing Provider  albuterol (PROAIR HFA) 108 (90 Base) MCG/ACT inhaler Inhale 1-2 puffs into the lungs every 6 (six) hours as needed for wheezing or shortness of breath. 08/10/19   Graciella Freer, PA-C  cyclobenzaprine (FLEXERIL) 5 MG tablet Take 5 mg by mouth 2 (two) times daily as needed for muscle spasms.  11/03/19   [provider]  dabigatran (PRADAXA) 150 MG CAPS  capsule Take 1 capsule (150 mg total) by mouth 2 (two) times daily. Medicaid [id number] O Patient taking differently: Take 150 mg by mouth 2 (two) times daily.  02/09/19   Benjiman Core, MD  diltiazem (CARDIZEM CD) 240 MG 24 hr capsule Take 1 capsule (240 mg total) by mouth daily. Medicaid [id number] O Patient taking differently: Take 240 mg by mouth daily.  02/09/19 12/10/19  Benjiman Core, MD  diphenhydrAMINE (BANOPHEN) 25 MG tablet Take 25 mg by mouth daily.    [provider]  divalproex (DEPAKOTE SPRINKLE) 125 MG capsule Take 125 mg by mouth 3 (three) times daily.    [provider]  FLUoxetine (PROZAC) 10 MG capsule Take 10 mg by mouth daily.    [provider]  fluticasone furoate-vilanterol (BREO ELLIPTA) 100-25 MCG/INH AEPB Inhale 1 puff into the lungs daily.    [provider]  folic acid (FOLVITE) 1 MG tablet Take 1 tablet (1 mg total) by mouth daily. Patient not taking: Reported on 12/10/2019 04/04/17   Elease Etienne, MD  furosemide (LASIX) 40 MG tablet Take 1 tablet (40 mg total) by mouth daily. Medicaid [id number] O Patient not taking: Reported on 12/10/2019 08/10/19   Graciella Freer, PA-C  HYDROcodone-acetaminophen (NORCO/VICODIN) 5-325 MG tablet Take 1 tablet by mouth 3 (three) times daily as needed for moderate pain.  11/10/19   [provider]  Lactobacillus (PROBIOTIC ACIDOPHILUS PO) Take 1 tablet by mouth in the morning and at bedtime.    [provider]  losartan (COZAAR) 25 MG tablet Take 1 tablet (25 mg total) by mouth daily. Medicaid [id number] O Patient taking differently: Take 25 mg by mouth daily.  02/09/19 12/10/19  Benjiman Core, MD  metoprolol succinate (TOPROL-XL) 50 MG 24 hr tablet Take 1 tablet (50 mg total) by mouth daily. Take with or immediately following a meal. Medicaid [id number] O Patient taking differently: Take 50 mg by mouth daily.  05/09/19   Marinus Maw, MD  mometasone-formoterol  [facility]) 200-5 MCG/ACT AERO Inhale 2 puffs into the lungs 2 (two) times daily. Medicaid [id number] O Patient not taking: Reported on 12/10/2019 08/10/19   Graciella Freer, PA-C  Multiple Vitamin (MULTIVITAMIN WITH MINERALS) TABS tablet Take 1 tablet by mouth daily. Patient not taking: Reported on 12/10/2019 04/04/17   Elease Etienne, MD  OXYGEN Inhale 3 L into the lungs continuous.     [provider]  polyethylene glycol (MIRALAX / GLYCOLAX) 17 g packet Take 17 g by mouth daily. Patient taking differently: Take 17 g by mouth daily as needed for mild constipation.  03/15/19   Dellia Cloud, MD  predniSONE (DELTASONE) 10 MG tablet Take 10 mg by mouth daily with breakfast.    [provider]  senna (SENOKOT) 8.6 MG TABS tablet Take 1-2 tablets (8.6-17.2 mg total) by mouth daily. Patient taking differently: Take 1-2 tablets by mouth daily as needed for mild constipation.  03/15/19   Dellia Cloud, MD  simvastatin (ZOCOR) 20 MG tablet Take 20 mg by mouth daily.  06/04/19  [provider]  spironolactone (ALDACTONE) 25 MG tablet Take 1 tablet (25 mg total) by mouth daily. 08/10/19   Graciella Freer, PA-C  thiamine 100 MG tablet Take 1 tablet (100 mg total) by mouth daily. Patient not taking: Reported on 12/10/2019 04/04/17   Elease Etienne, MD  tiotropium (SPIRIVA) 18 MCG inhalation capsule Place 1 capsule (18 mcg total) into inhaler and inhale daily. 08/10/19   Graciella Freer, PA-C  traZODone (DESYREL) 50 MG tablet Take 50 mg by mouth at bedtime.    [provider]  vitamin E 45 MG (100 UNITS) capsule Take 100 Units by mouth daily.    [provider]    Physical Exam: Vitals:   06/05/20 2230 06/05/20 2300 06/05/20 2315 06/06/20 0045  BP: (!) 130/114 114/90 (!) 118/91 121/86  Pulse: (!) 143 (!) 103 90 75  Resp: (!) 30 20 18  (!) 22  Temp:      TempSrc:      SpO2: 97% 93% 95% 97%    Constitutional: NAD, calm  Eyes: PERTLA, lids  and conjunctivae normal ENMT: Mucous membranes are moist. Posterior pharynx clear of any exudate or lesions.   Neck: normal, supple, no masses, no thyromegaly Respiratory: Diminished breath sounds bilaterally, prolonged expiratory phase. No accessory muscle use.  Cardiovascular: Rate ~120 and irregularly irregular. Pretibial pitting edema bilaterally.  Abdomen: no tenderness, soft. Bowel sounds active.  Musculoskeletal: no clubbing / cyanosis. No joint deformity upper and lower extremities.   Skin: no significant rashes, lesions, ulcers. Warm, dry, well-perfused. Neurologic: CN 2-12 grossly intact. Sensation intact. Moving all extremities.  Psychiatric: Alert and oriented to person, place, and situation. Pleasant and cooperative.    Labs and Imaging on Admission: I have personally reviewed following labs and imaging studies  CBC: Recent Labs  Lab 06/05/20 1934  WBC 4.8  NEUTROABS 4.0  HGB 10.0*  HCT 32.8*  MCV 99.4  PLT 144*   Basic Metabolic Panel: Recent Labs  Lab 06/05/20 1934  NA 139  K 2.9*  CL 108  CO2 25  GLUCOSE 110*  BUN 7*  CREATININE 0.63  CALCIUM 5.9*   GFR: CrCl cannot be calculated (Unknown ideal weight.). Liver Function Tests: Recent Labs  Lab 06/05/20 1934  AST 20  ALT 13  ALKPHOS 35*  BILITOT 0.8  PROT 4.1*  ALBUMIN 2.4*   Recent Labs  Lab 06/05/20 1934  LIPASE 25   No results for input(s): AMMONIA in the last 168 hours. Coagulation Profile: No results for input(s): INR, PROTIME in the last 168 hours. Cardiac Enzymes: No results for input(s): CKTOTAL, CKMB, CKMBINDEX, TROPONINI in the last 168 hours. BNP (last 3 results) No results for input(s): PROBNP in the last 8760 hours. HbA1C: No results for input(s): HGBA1C in the last 72 hours. CBG: No results for input(s): GLUCAP in the last 168 hours. Lipid Profile: No results for input(s): CHOL, HDL, LDLCALC, TRIG, CHOLHDL, LDLDIRECT in the last 72 hours. Thyroid Function Tests: No  results for input(s): TSH, T4TOTAL, FREET4, T3FREE, THYROIDAB in the last 72 hours. Anemia Panel: No results for input(s): VITAMINB12, FOLATE, FERRITIN, TIBC, IRON, RETICCTPCT in the last 72 hours. Urine analysis:    Component Value Date/Time   COLORURINE AMBER (A) 06/05/2020 2200   APPEARANCEUR CLEAR 06/05/2020 2200   LABSPEC 1.031 (H) 06/05/2020 2200   PHURINE 5.0 06/05/2020 2200   GLUCOSEU NEGATIVE 06/05/2020 2200   HGBUR SMALL (A) 06/05/2020 2200   BILIRUBINUR NEGATIVE 06/05/2020 2200   KETONESUR 5 (A)  06/05/2020 2200   PROTEINUR 30 (A) 06/05/2020 2200   UROBILINOGEN 0.2 12/08/2012 1516   NITRITE NEGATIVE 06/05/2020 2200   LEUKOCYTESUR SMALL (A) 06/05/2020 2200   Sepsis Labs: @LABRCNTIP (procalcitonin:4,lacticidven:4) )No results found for this or any previous visit (from the past 240 hour(s)).   Radiological Exams on Admission: DG Chest 2 View  Result Date: 06/05/2020 CLINICAL DATA:  Shortness of breath on exertion for several weeks. Current smoker. EXAM: CHEST - 2 VIEW COMPARISON:  12/16/2019 FINDINGS: Heart size and pulmonary vascularity are normal. Emphysematous changes and probable fibrosis in the lungs. No airspace disease or consolidation. No pleural effusions. No pneumothorax. Mediastinal contours appear intact. Calcification of the aorta. Degenerative changes in the spine. IMPRESSION: Emphysematous changes and fibrosis in the lungs. No evidence of active pulmonary disease. Electronically Signed   By: 13/06/2019 M.D.   On: 06/05/2020 22:18    EKG: Independently reviewed. Atrial fibrillation with RVR, rate 127.   Assessment/Plan   1. Atrial fibrillation with RVR  - Presents with exertional dyspnea and palpitations and noted to be in atrial fibrillation with rate 120-140s  - CHADS-VASc 4 (age, CAD, CHF, HTN)  - Resume Pradaxa, metoprolol, and dilitiazem, continue IV Lopressor as needed   2. Cardiomyopathy  - EF was 45-50% in May 2021  - Peripheral edema noted on  admission  - Resume diuretics, beta-blocker, ARB   3. Hypokalemia; hypocalcemia   - Serum potassium 2.9 and calcium 5.9 in ED with albumin 2.4 in ED  - Potassium and calcium replaced in ED  - Check magnesium and ionized calcium, repeat chem panel in am    4. COPD, chronic hypoxic respiratory failure - Stable  - Continue Spiriva, Dulera, as needed albuterol, and supplemental oxygen  5. Hypertension  - BP at goal  - Continue beta-blocker, ARB    6. CAD - No anginal complaints  - Continue statin, beta-blocker, ARB     DVT prophylaxis: Pradaxa  Code Status: Full  Level of Care: Level of care: Progressive Family Communication: None present  Disposition Plan:  Patient is from: Home  Anticipated d/c is to: TBD Anticipated d/c date is: 4/28 or 06/08/20 Patient currently: Pending rate-control, electrolyte replacement   Consults called: none  Admission status: Inpatient     06/10/20, MD [facility]  06/06/2020, 12:59 AM

## 2020-06-05 NOTE — ED Provider Notes (Signed)
[facility] EMERGENCY DEPARTMENT Provider Note   CSN: [id number] Arrival date & time: 06/05/20  1834     History Chief Complaint  Patient presents with  . Shortness of Breath    Johnny Rodriguez is a 70 y.o. male.   Shortness of Breath Severity:  Moderate Onset quality:  Gradual Timing:  Constant Progression:  Worsening Chronicity:  Chronic Context comment:  Hx of HF and COPD Relieved by:  Nothing Worsened by:  Exertion Ineffective treatments:  None tried Associated symptoms: abdominal pain   Associated symptoms: no chest pain, no cough, no fever, no headaches, no rash and no vomiting        Past Medical History:  Diagnosis Date  . AF (atrial fibrillation) (HCC)   . Alcohol abuse   . Angina pectoris   . CAD (coronary artery disease) 12/15/2004  . COPD (chronic obstructive pulmonary disease) (HCC)   . Coronary artery disease   . Heavy cigarette smoker   . HTN (hypertension)   . Idiopathic cardiomyopathy (HCC) 07/16/2009  . Non compliance with medical treatment   . Seizures [facility])     Patient Active Problem List   Diagnosis Date Noted  . COPD with acute exacerbation (HCC) 03/11/2019  . AKI (acute kidney injury) (HCC) 03/10/2019  . Bowel obstruction (HCC) 03/10/2019  . Chronic respiratory failure with hypoxia (HCC) 10/15/2016  . Cigarette smoker 09/09/2016  . Long term current use of anticoagulant therapy 06/24/2016  . History of syncope 06/24/2016  . COPD exacerbation (HCC) 02/08/2015  . Essential hypertension 02/08/2015  . Syncope 12/10/2011  . History of noncompliance with medical treatment 12/01/2011  . Chronic systolic heart failure (HCC) 04/10/2011  . Idiopathic cardiomyopathy (HCC) 07/16/2009  . Paroxysmal atrial fibrillation (HCC) 07/16/2009  . COPD PFT's pending  07/16/2009  . CAD (coronary artery disease) 12/15/2004    Past Surgical History:  Procedure Laterality Date  . CARDIAC CATHETERIZATION  2006  . CARDIAC  CATHETERIZATION  12/2004   Left   . CARDIOVERSION  01/21/2011   Procedure: CARDIOVERSION;  Surgeon: Darden Palmer., MD;  Location: [facility] OR;  Service: Cardiovascular;  Laterality: N/A;  . DOPPLER ECHOCARDIOGRAPHY  02/2011  . ELECTROPHYSIOLOGY STUDY N/A 12/16/2011   Procedure: ELECTROPHYSIOLOGY STUDY;  Surgeon: Marinus Maw, MD;  Location: [facility] CATH LAB;  Service: Cardiovascular;  Laterality: N/A;  . EP IMPLANTABLE DEVICE N/A 08/24/2015   Procedure: Loop Recorder Removal;  Surgeon: Marinus Maw, MD;  Location: [facility] INVASIVE CV LAB;  Service: Cardiovascular;  Laterality: N/A;  . fx collar bone    . NO PAST SURGERIES         Family History  Problem Relation Age of Onset  . Suicidality Sister   . CVA Mother   . Stroke Mother   . Heart disease Father   . Coronary artery disease Father   . COPD Father     Social History   Tobacco Use  . Smoking status: Current Every Day Smoker    Packs/day: 1.00    Years: 58.00    Pack years: 58.00  . Smokeless tobacco: Never Used  . Tobacco comment: pt states he is down to 1ppd  Vaping Use  . Vaping Use: Never used  Substance Use Topics  . Alcohol use: Yes    Alcohol/week: 3.0 standard drinks    Types: 3 Cans of beer per week    Comment: EtOH use diorder - liquor  . Drug use: No    Home Medications Prior to  Admission medications   Medication Sig Start Date End Date Taking? Authorizing Provider  albuterol (PROAIR HFA) 108 (90 Base) MCG/ACT inhaler Inhale 1-2 puffs into the lungs every 6 (six) hours as needed for wheezing or shortness of breath. 08/10/19   Graciella Freer, PA-C  cyclobenzaprine (FLEXERIL) 5 MG tablet Take 5 mg by mouth 2 (two) times daily as needed for muscle spasms.  11/03/19   [provider]  dabigatran (PRADAXA) 150 MG CAPS capsule Take 1 capsule (150 mg total) by mouth 2 (two) times daily. Medicaid [id number] O Patient taking differently: Take 150 mg by mouth 2 (two) times daily.  02/09/19   Benjiman Core, MD  diltiazem (CARDIZEM CD) 240 MG 24 hr capsule Take 1 capsule (240 mg total) by mouth daily. Medicaid [id number] O Patient taking differently: Take 240 mg by mouth daily.  02/09/19 12/10/19  Benjiman Core, MD  diphenhydrAMINE (BANOPHEN) 25 MG tablet Take 25 mg by mouth daily.    [provider]  divalproex (DEPAKOTE SPRINKLE) 125 MG capsule Take 125 mg by mouth 3 (three) times daily.    [provider]  FLUoxetine (PROZAC) 10 MG capsule Take 10 mg by mouth daily.    [provider]  fluticasone furoate-vilanterol (BREO ELLIPTA) 100-25 MCG/INH AEPB Inhale 1 puff into the lungs daily.    [provider]  folic acid (FOLVITE) 1 MG tablet Take 1 tablet (1 mg total) by mouth daily. Patient not taking: Reported on 12/10/2019 04/04/17   Elease Etienne, MD  furosemide (LASIX) 40 MG tablet Take 1 tablet (40 mg total) by mouth daily. Medicaid [id number] O Patient not taking: Reported on 12/10/2019 08/10/19   Graciella Freer, PA-C  HYDROcodone-acetaminophen (NORCO/VICODIN) 5-325 MG tablet Take 1 tablet by mouth 3 (three) times daily as needed for moderate pain.  11/10/19   [provider]  Lactobacillus (PROBIOTIC ACIDOPHILUS PO) Take 1 tablet by mouth in the morning and at bedtime.    [provider]  losartan (COZAAR) 25 MG tablet Take 1 tablet (25 mg total) by mouth daily. Medicaid [id number] O Patient taking differently: Take 25 mg by mouth daily.  02/09/19 12/10/19  Benjiman Core, MD  metoprolol succinate (TOPROL-XL) 50 MG 24 hr tablet Take 1 tablet (50 mg total) by mouth daily. Take with or immediately following a meal. Medicaid [id number] O Patient taking differently: Take 50 mg by mouth daily.  05/09/19   Marinus Maw, MD  mometasone-formoterol [facility]) 200-5 MCG/ACT AERO Inhale 2 puffs into the lungs 2 (two) times daily. Medicaid [id number] O Patient not taking: Reported on 12/10/2019 08/10/19   Graciella Freer, PA-C   Multiple Vitamin (MULTIVITAMIN WITH MINERALS) TABS tablet Take 1 tablet by mouth daily. Patient not taking: Reported on 12/10/2019 04/04/17   Elease Etienne, MD  OXYGEN Inhale 3 L into the lungs continuous.     [provider]  polyethylene glycol (MIRALAX / GLYCOLAX) 17 g packet Take 17 g by mouth daily. Patient taking differently: Take 17 g by mouth daily as needed for mild constipation.  03/15/19   Dellia Cloud, MD  predniSONE (DELTASONE) 10 MG tablet Take 10 mg by mouth daily with breakfast.    [provider]  senna (SENOKOT) 8.6 MG TABS tablet Take 1-2 tablets (8.6-17.2 mg total) by mouth daily. Patient taking differently: Take 1-2 tablets by mouth daily as needed for mild constipation.  03/15/19   Dellia Cloud, MD  simvastatin (ZOCOR) 20 MG tablet Take 20 mg by mouth daily.  06/04/19   [provider]  spironolactone (ALDACTONE) 25 MG tablet Take 1 tablet (25 mg total) by mouth daily. 08/10/19   Graciella Freer, PA-C  thiamine 100 MG tablet Take 1 tablet (100 mg total) by mouth daily. Patient not taking: Reported on 12/10/2019 04/04/17   Elease Etienne, MD  tiotropium (SPIRIVA) 18 MCG inhalation capsule Place 1 capsule (18 mcg total) into inhaler and inhale daily. 08/10/19   Graciella Freer, PA-C  traZODone (DESYREL) 50 MG tablet Take 50 mg by mouth at bedtime.    [provider]  vitamin E 45 MG (100 UNITS) capsule Take 100 Units by mouth daily.    [provider]    Allergies    Patient has no known allergies.  Review of Systems   Review of Systems  Constitutional: Negative for chills and fever.  HENT: Negative for congestion and rhinorrhea.   Respiratory: Positive for shortness of breath. Negative for cough.   Cardiovascular: Positive for leg swelling. Negative for chest pain and palpitations.  Gastrointestinal: Positive for abdominal distention and abdominal pain. Negative for diarrhea, nausea and vomiting.   Genitourinary: Negative for difficulty urinating and dysuria.  Musculoskeletal: Negative for arthralgias and back pain.  Skin: Negative for color change and rash.  Neurological: Negative for light-headedness and headaches.    Physical Exam Updated Vital Signs BP (!) 118/94   Pulse (!) 116   Temp 98.6 F (37 C) (Oral)   Resp 18   SpO2 96%   Physical Exam Vitals and nursing note reviewed.  Constitutional:      General: He is not in acute distress.    Appearance: Normal appearance.  HENT:     Head: Normocephalic and atraumatic.     Nose: No rhinorrhea.  Eyes:     General:        Right eye: No discharge.        Left eye: No discharge.     Conjunctiva/sclera: Conjunctivae normal.  Cardiovascular:     Rate and Rhythm: Tachycardia present. Rhythm irregular.  Pulmonary:     Effort: Pulmonary effort is normal.     Breath sounds: No stridor. Wheezing and rales present. No decreased breath sounds.  Chest:     Chest wall: No mass.  Abdominal:     General: Abdomen is flat. There is distension.     Palpations: Abdomen is soft.     Tenderness: There is generalized abdominal tenderness.  Musculoskeletal:        General: No deformity or signs of injury.  Skin:    General: Skin is warm and dry.  Neurological:     General: No focal deficit present.     Mental Status: He is alert. Mental status is at baseline.     Motor: No weakness.  Psychiatric:        Mood and Affect: Mood normal.        Behavior: Behavior normal.        Thought Content: Thought content normal.     ED Results / Procedures / Treatments   Labs (all labs ordered are listed, but only abnormal results are displayed) Labs Reviewed  CBC WITH DIFFERENTIAL/PLATELET - Abnormal; Notable for the following components:      Result Value   RBC 3.30 (*)    Hemoglobin 10.0 (*)    HCT 32.8 (*)    Platelets 144 (*)    Lymphs Abs 0.5 (*)    All other components within normal limits  COMPREHENSIVE METABOLIC PANEL -  Abnormal; Notable for the following components:   Potassium 2.9 (*)    Glucose, Bld 110 (*)    BUN 7 (*)    Calcium 5.9 (*)    Total Protein 4.1 (*)    Albumin 2.4 (*)    Alkaline Phosphatase 35 (*)    All other components within normal limits  LIPASE, BLOOD  BRAIN NATRIURETIC PEPTIDE  D-DIMER, QUANTITATIVE (NOT AT [facility]  URINALYSIS, ROUTINE W REFLEX MICROSCOPIC  CALCIUM, IONIZED  MAGNESIUM  MAGNESIUM  TROPONIN I (HIGH SENSITIVITY)    EKG EKG Interpretation  Date/Time:  Tuesday June 05 2020 18:42:55 EDT Ventricular Rate:  127 PR Interval:    QRS Duration: 84 QT Interval:  304 QTC Calculation: 441 R Axis:   269 Text Interpretation: Atrial fibrillation with rapid ventricular response Right superior axis deviation Anterior infarct , age undetermined Abnormal ECG Confirmed by Cherlynn Perches ([id number]) on 06/05/2020 6:45:07 PM   Radiology DG Chest 2 View  Result Date: 06/05/2020 CLINICAL DATA:  Shortness of breath on exertion for several weeks. Current smoker. EXAM: CHEST - 2 VIEW COMPARISON:  12/16/2019 FINDINGS: Heart size and pulmonary vascularity are normal. Emphysematous changes and probable fibrosis in the lungs. No airspace disease or consolidation. No pleural effusions. No pneumothorax. Mediastinal contours appear intact. Calcification of the aorta. Degenerative changes in the spine. IMPRESSION: Emphysematous changes and fibrosis in the lungs. No evidence of active pulmonary disease. Electronically Signed   By: Burman Nieves M.D.   On: 06/05/2020 22:18    Procedures Procedures   Medications Ordered in ED Medications  calcium gluconate 1 g/ 50 mL sodium chloride IVPB (1,000 mg Intravenous New Bag/Given 06/05/20 2213)  potassium chloride SA (KLOR-CON) CR tablet 40 mEq (has no administration in time range)  metoprolol tartrate (LOPRESSOR) injection 5 mg (has no administration in time range)    ED Course  I have reviewed the triage vital signs and the nursing  notes.  Pertinent labs & imaging results that were available during my care of the patient were reviewed by me and considered in my medical decision making (see chart for details).    MDM Rules/Calculators/A&P                          Patient with multiple untreated comorbidities comes with exertional shortness of breath, has signs of fluid overload on exam.  Will need labs will likely need diuresis.  Is on home O2.  Will get imaging will get screening labs for cardiac disease.  Patient's cardiac biomarkers are fairly unremarkable.  He is in and out of rapid ventricular response with his atrial fibrillation.  He is hypokalemic and hypocalcemic.  We will get additional testing to include ionized calcium and magnesium.  We will control his rate with Lopressor.  I will consult for admission for this patient with multiple derangements as well as need for likely social work and assistance with medical care outside of the hospital. Final Clinical Impression(s) / ED Diagnoses Final diagnoses:  Hypocalcemia  Hypokalemia  Atrial fibrillation, rapid [facility])    Rx / DC Orders ED Discharge Orders    None       Sabino Donovan, MD 06/05/20 2229

## 2020-06-05 NOTE — ED Triage Notes (Signed)
Pt presents to ED BIB [facility] from home. Pt c/o SOB, and swelling to abd. Pt reports that this began today. Pt reports that he has also been dizzy and dry cough. On #L [place] chronically EMS given 125 solumedrol, 0.5 atrovent, and 1g mag. EMS VS -  rr - 45  [place] [facility] emergency contact [phone]

## 2020-06-05 NOTE — ED Notes (Signed)
ED Provider at bedside. 

## 2020-06-06 DIAGNOSIS — I4891 Unspecified atrial fibrillation: Secondary | ICD-10-CM | POA: Diagnosis not present

## 2020-06-06 DIAGNOSIS — I480 Paroxysmal atrial fibrillation: Secondary | ICD-10-CM | POA: Diagnosis not present

## 2020-06-06 DIAGNOSIS — E876 Hypokalemia: Secondary | ICD-10-CM | POA: Insufficient documentation

## 2020-06-06 LAB — COMPREHENSIVE METABOLIC PANEL
ALT: 16 U/L (ref 0–44)
AST: 21 U/L (ref 15–41)
Albumin: 3.7 g/dL (ref 3.5–5.0)
Alkaline Phosphatase: 51 U/L (ref 38–126)
Anion gap: 9 (ref 5–15)
BUN: 8 mg/dL (ref 8–23)
CO2: 36 mmol/L — ABNORMAL HIGH (ref 22–32)
Calcium: 9.6 mg/dL (ref 8.9–10.3)
Chloride: 94 mmol/L — ABNORMAL LOW (ref 98–111)
Creatinine, Ser: 1.02 mg/dL (ref 0.61–1.24)
GFR, Estimated: >60 mL/min (ref >60)
Glucose, Bld: 200 mg/dL — ABNORMAL HIGH (ref 70–99)
Potassium: 4.6 mmol/L (ref 3.5–5.1)
Sodium: 139 mmol/L (ref 135–145)
Total Bilirubin: 0.5 mg/dL (ref 0.3–1.2)
Total Protein: 6.5 g/dL (ref 6.5–8.1)

## 2020-06-06 LAB — CBC
HCT: 43.6 % (ref 39.0–52.0)
Hemoglobin: 13.5 g/dL (ref 13.0–17.0)
MCH: 29.9 pg (ref 26.0–34.0)
MCHC: 31 g/dL (ref 30.0–36.0)
MCV: 96.5 fL (ref 80.0–100.0)
Platelets: 200 10*3/uL (ref 150–400)
RBC: 4.52 MIL/uL (ref 4.22–5.81)
RDW: 12.3 % (ref 11.5–15.5)
WBC: 4.7 10*3/uL (ref 4.0–10.5)
nRBC: 0 % (ref 0.0–0.2)

## 2020-06-06 LAB — SARS CORONAVIRUS 2 (TAT 6-24 HRS): SARS Coronavirus 2: NEGATIVE

## 2020-06-06 LAB — MAGNESIUM
Magnesium: 2.2 mg/dL (ref 1.7–2.4)
Magnesium: 2.2 mg/dL (ref 1.7–2.4)

## 2020-06-06 LAB — HIV ANTIBODY (ROUTINE TESTING W REFLEX)
HIV Screen 4th Generation wRfx: NONREACTIVE
HIV Screen 4th Generation wRfx: NONREACTIVE

## 2020-06-06 LAB — MRSA PCR SCREENING: MRSA by PCR: NEGATIVE

## 2020-06-06 MED ORDER — DABIGATRAN ETEXILATE MESYLATE 150 MG PO CAPS
150.0000 mg | ORAL_CAPSULE | Freq: Two times a day (BID) | ORAL | Status: DC
  Administered 2020-06-06 – 2020-06-07 (×3): 150 mg via ORAL
  Filled 2020-06-06 (×4): qty 1

## 2020-06-06 MED ORDER — FUROSEMIDE 20 MG PO TABS
20.0000 mg | ORAL_TABLET | Freq: Every day | ORAL | Status: DC
  Administered 2020-06-07: 20 mg via ORAL
  Filled 2020-06-06: qty 1

## 2020-06-06 MED ORDER — TRAZODONE HCL 50 MG PO TABS
50.0000 mg | ORAL_TABLET | Freq: Every evening | ORAL | Status: DC | PRN
  Administered 2020-06-06: 50 mg via ORAL
  Filled 2020-06-06: qty 1

## 2020-06-06 MED ORDER — TIOTROPIUM BROMIDE MONOHYDRATE 18 MCG IN CAPS: 1.0000 | ORAL_CAPSULE | Freq: Every day | RESPIRATORY_TRACT | Status: DC

## 2020-06-06 MED ORDER — SIMVASTATIN 20 MG PO TABS
20.0000 mg | ORAL_TABLET | Freq: Every day | ORAL | Status: DC
  Administered 2020-06-06 – 2020-06-07 (×2): 20 mg via ORAL
  Filled 2020-06-06 (×2): qty 1

## 2020-06-06 MED ORDER — FUROSEMIDE 40 MG PO TABS
40.0000 mg | ORAL_TABLET | Freq: Every day | ORAL | Status: DC
  Administered 2020-06-06: 40 mg via ORAL
  Filled 2020-06-06: qty 1

## 2020-06-06 MED ORDER — MOMETASONE FURO-FORMOTEROL FUM 200-5 MCG/ACT IN AERO
2.0000 | INHALATION_SPRAY | Freq: Two times a day (BID) | RESPIRATORY_TRACT | Status: DC
  Administered 2020-06-06 – 2020-06-07 (×3): 2 via RESPIRATORY_TRACT
  Filled 2020-06-06: qty 8.8

## 2020-06-06 MED ORDER — LOSARTAN POTASSIUM 25 MG PO TABS
25.0000 mg | ORAL_TABLET | Freq: Every day | ORAL | Status: DC
  Administered 2020-06-06: 25 mg via ORAL
  Filled 2020-06-06: qty 1

## 2020-06-06 MED ORDER — POTASSIUM CHLORIDE 10 MEQ/100ML IV SOLN
INTRAVENOUS | Status: AC
  Administered 2020-06-06: 10 meq
  Filled 2020-06-06: qty 100

## 2020-06-06 MED ORDER — METOPROLOL SUCCINATE ER 50 MG PO TB24
50.0000 mg | ORAL_TABLET | Freq: Every day | ORAL | Status: DC
  Administered 2020-06-06 – 2020-06-07 (×2): 50 mg via ORAL
  Filled 2020-06-06 (×2): qty 1

## 2020-06-06 MED ORDER — SPIRONOLACTONE 25 MG PO TABS
25.0000 mg | ORAL_TABLET | Freq: Every day | ORAL | Status: DC
  Administered 2020-06-06 – 2020-06-07 (×2): 25 mg via ORAL
  Filled 2020-06-06 (×2): qty 1

## 2020-06-06 MED ORDER — ALBUTEROL SULFATE HFA 108 (90 BASE) MCG/ACT IN AERS
2.0000 | INHALATION_SPRAY | RESPIRATORY_TRACT | Status: DC | PRN
  Administered 2020-06-06 – 2020-06-07 (×2): 2 via RESPIRATORY_TRACT
  Filled 2020-06-06: qty 6.7

## 2020-06-06 MED ORDER — DILTIAZEM HCL ER COATED BEADS 240 MG PO CP24
240.0000 mg | ORAL_CAPSULE | Freq: Every day | ORAL | Status: DC
  Administered 2020-06-06 – 2020-06-07 (×2): 240 mg via ORAL
  Filled 2020-06-06 (×2): qty 1

## 2020-06-06 MED ORDER — UMECLIDINIUM BROMIDE 62.5 MCG/INH IN AEPB
1.0000 | INHALATION_SPRAY | Freq: Every day | RESPIRATORY_TRACT | Status: DC
  Administered 2020-06-06 – 2020-06-07 (×2): 1 via RESPIRATORY_TRACT
  Filled 2020-06-06: qty 7

## 2020-06-06 NOTE — Progress Notes (Signed)
PROGRESS NOTE   Johnny Rodriguez  ZSW:[id number] DOB: August 23, 1950 DOA: 06/05/2020 PCP: System, Provider Not In  Brief Narrative:   70 year old community dwelling male CAD, COPD with chronic respiratory failure/3 L Ongoing tobacco P A. fib CHADS2 score >4 on Pradaxa HFpEF EF 45-50% 06/2019  Presented to ED 4/26 after running out of all his meds-found to have tachycardia exertional dyspnea Rx Solu-Medrol Atrovent magnesium-respiratory rate 45 Tachycardic to 130 EKG atrial fibrillation RVR CXR emphysema Potassium 2.9 Calcium 5.9 Rx metoprolol potassium and calcium in ED   Hospital-Problem based course  Atrial fibrillation RVR on admission CHADS2 score >4 Patient noncompliant on all meds and had not taken in several months Continue Cardizem 240 mg CD, metoprolol XL 50 daily--> upward adjust if heart rate not controlled below 110 in the next 24 hours Can use as needed metoprolol 5 mg for heart rate >110 Continue Pradaxa 150 twice daily COPD with chronic respiratory failure Ongoing tobacco abuse Uses 2 to 3 L at baseline-patient desatted to 84 when I took him off the oxygen in the room Continue Dulera 2 puffs twice daily, Incruse Ellipta 1 puff daily, albuterol 2 puffs every 4 as needed He will ambulate later today to ensure that he is able to do so with physical therapy help Severe hypokalemia on admission Replaced and currently resolved Acute superimposed on HFrEF based on echo 06/16/2019 EF 45-50% global hypokinesis Cautious resumption Aldactone 25 daily Mildly volume overloaded on exam Cut back home dose Lasix 40 daily--:> 20 daily Follow labs Stop losartan at this time as we may need to increase rate control as above Debility He apparently gets Meals on Wheels because he is unable to move around because of his shortness of breath I have asked social work to look into his case as he is asking multiple questions about having "an advocate" to help him Nursing reports he lives  in a hotel but is able to get his oxygen?    DVT prophylaxis: Pradaxa Code Status: Full Family Communication: Emergency contact Maralyn Sago [phone] Disposition:  Status is: Inpatient  Remains inpatient appropriate because:Hemodynamically unstable, Unsafe d/c plan and IV treatments appropriate due to intensity of illness or inability to take PO   Dispo: The patient is from: Home              Anticipated d/c is to: MIght be living in ? Hotel              Patient currently is not medically stable to d/c.   Difficult to place patient No       Consultants:   None  Procedures: No  Antimicrobials: No   Subjective: Quite tangential-pleasant-wants to get up and move around Eating and drinking 100% of meals No chest pain  Objective: Vitals:   06/06/20 0300 06/06/20 0737 06/06/20 0802 06/06/20 0804  BP: (!) 121/95 109/83    Pulse: (!) 101 (!) 106    Resp: 19 (!) 25    Temp:  98 F (36.7 C)    TempSrc:  Oral    SpO2: 94% 90% 93% 94%  Weight:      Height:        Intake/Output Summary (Last 24 hours) at 06/06/2020 0932 Last data filed at 06/06/2020 6237 Gross per 24 hour  Intake 50 ml  Output 400 ml  Net -350 ml   Filed Weights   06/06/20 0241  Weight: 85 kg    Examination:  EOMI NCAT disheveled mild JVD no icterus no pallor neck  soft supple S1-S2 tachycardic A. fib on monitors rate controlled Abdomen soft no rebound no guarding No lower extremity edema Neurologically intact moving all 4 limbs equally Chest clear no rales rhonchi Gross neuro exam intact  Data Reviewed: personally reviewed   Data Potassium 2.9-->4.6 Magnesium 2.2 CO2 25-->36 Hemoglobin 13.5 platelet 200   Radiology Studies: DG Chest 2 View  Result Date: 06/05/2020 CLINICAL DATA:  Shortness of breath on exertion for several weeks. Current smoker. EXAM: CHEST - 2 VIEW COMPARISON:  12/16/2019 FINDINGS: Heart size and pulmonary vascularity are normal. Emphysematous changes and probable  fibrosis in the lungs. No airspace disease or consolidation. No pleural effusions. No pneumothorax. Mediastinal contours appear intact. Calcification of the aorta. Degenerative changes in the spine. IMPRESSION: Emphysematous changes and fibrosis in the lungs. No evidence of active pulmonary disease. Electronically Signed   By: Burman Nieves M.D.   On: 06/05/2020 22:18     Scheduled Meds: . dabigatran  150 mg Oral BID  . diltiazem  240 mg Oral Daily  . furosemide  40 mg Oral Daily  . losartan  25 mg Oral Daily  . metoprolol succinate  50 mg Oral Daily  . mometasone-formoterol  2 puff Inhalation BID  . simvastatin  20 mg Oral Daily  . spironolactone  25 mg Oral Daily  . umeclidinium bromide  1 puff Inhalation Daily   Continuous Infusions: . sodium chloride    . magnesium sulfate bolus IVPB Stopped (06/06/20 0033)     LOS: 1 day   Time spent: 40  Rhetta Mura, MD [facility] To contact the attending provider between 7A-7P or the covering provider during after hours 7P-7A, please log into the web site [email or web address] and access using universal [place] password for that web site. If you do not have the password, please call the hospital operator.  06/06/2020, 9:32 AM

## 2020-06-06 NOTE — Hospital Course (Signed)
70 year old community dwelling male CAD, COPD with chronic respiratory failure/3 L Ongoing tobacco P A. fib CHADS2 score >4 on Pradaxa HFpEF EF 45-50% 06/2019  Presented to ED 4/26 after running out of all his meds-found to have tachycardia exertional dyspnea Rx Solu-Medrol Atrovent magnesium-respiratory rate 45 Emergency contact Sarah [phone] Tachycardic to 130 EKG atrial fibrillation RVR CXR emphysema Potassium 2.9 Calcium 5.9 Rx metoprolol potassium and calcium in ED   Data Potassium 2.9-->4.6 Magnesium 2.2 CO2 25-->36 Hemoglobin 13.5 platelet 200

## 2020-06-06 NOTE — TOC Progression Note (Signed)
Transition of Care [facility]) - Progression Note    Patient Details  Name: Johnny Rodriguez MRN: [id number] Date of Birth: [birth year]  Transition of [facility]) CM/SW Contact  Ivette Loyal, [place] Phone Number: 06/06/2020, 3:54 PM  Clinical Narrative:    3:00pm- Pt requested to speak with CSW, he had concerns about not being able to see and needing readers. CSW explained the volunteer services were working on getting him some readers, the receptionist had contacted them earlier today. Pt also spoke about a representative [employer] [phone]) from [employer] and stated they wanted to speak with CSW about what services pt could get while admitted. CSW contacted [employer] and there was no answer CSW left a VM. CSW will follow up for pt needs.        Expected Discharge Plan and Services                                                 Social Determinants of Health (SDOH) Interventions    Readmission Risk Interventions No flowsheet data found.

## 2020-06-06 NOTE — Plan of Care (Signed)

## 2020-06-06 NOTE — Evaluation (Signed)
Physical Therapy Evaluation Patient Details Name: Johnny Rodriguez MRN: [id number] DOB: April 18, 1950 Today's Date: 06/06/2020   History of Present Illness  Pt is a 70 y/o male admitted on 4/26 after running out of his home meds. Found to be in a fib with RVR. PMH includes COPD on 3L, CAD, HTN, and current smoker.  Clinical Impression  Pt admitted secondary to problem above with deficits below. Pt requiring min guard for safety during mobility tasks using RW. HR elevated to brief period of 140 and then remained at mid to upper 120s during gait. Mild SOB noted, but overall tolerated well. Pt currently lives alone. Feel he would benefit from HHPT at d/c to ensure safety with mobility. Will continue to follow acutely.     Follow Up Recommendations Home health PT    Equipment Recommendations  None recommended by PT    Recommendations for Other Services       Precautions / Restrictions Precautions Precautions: Fall Restrictions Weight Bearing Restrictions: No      Mobility  Bed Mobility Overal bed mobility: Needs Assistance Bed Mobility: Supine to Sit     Supine to sit: Supervision     General bed mobility comments: Supervision for safety and line management    Transfers Overall transfer level: Needs assistance Equipment used: Rolling walker (2 wheeled) Transfers: Sit to/from Stand Sit to Stand: Min guard         General transfer comment: Min guard for safety  Ambulation/Gait Ambulation/Gait assistance: Min guard Gait Distance (Feet): 200 Feet Assistive device: Rolling walker (2 wheeled) Gait Pattern/deviations: Step-through pattern;Decreased stride length Gait velocity: Decreased   General Gait Details: HR elevated to brief period of 140, but quickly returned to mid to upper 120s during ambulation. Min guard for safety using RW. No overt LOB noted.  Stairs            Wheelchair Mobility    Modified Rankin (Stroke Patients Only)       Balance Overall  balance assessment: Needs assistance Sitting-balance support: Feet supported Sitting balance-Leahy Scale: Good     Standing balance support: Bilateral upper extremity supported;During functional activity Standing balance-Leahy Scale: Poor Standing balance comment: Reliant on BUE support                             Pertinent Vitals/Pain Pain Assessment: No/denies pain    Home Living Family/patient expects to be discharged to:: Other (Comment) (motel) Living Arrangements: Alone Available Help at Discharge: Other (Comment) (reports no one) Type of Home: Other(Comment) (motel) Home Access: Level entry     Home Layout: One level Home Equipment: Hand held shower head;Walker - 4 wheels;Cane - single point      Prior Function Level of Independence: Independent with assistive device(s)         Comments: Uses cane for most of mobility. Uses rollator occasionally. Takes taxi for mobility     Hand Dominance        Extremity/Trunk Assessment   Upper Extremity Assessment Upper Extremity Assessment: Generalized weakness    Lower Extremity Assessment Lower Extremity Assessment: Generalized weakness    Cervical / Trunk Assessment Cervical / Trunk Assessment: Normal  Communication   Communication: No difficulties  Cognition Arousal/Alertness: Awake/alert Behavior During Therapy: WFL for tasks assessed/performed Overall Cognitive Status: No family/caregiver present to determine baseline cognitive functioning  General Comments: Likely close to baseline      General Comments      Exercises     Assessment/Plan    PT Assessment Patient needs continued PT services  PT Problem List Decreased strength;Decreased activity tolerance;Decreased mobility;Cardiopulmonary status limiting activity       PT Treatment Interventions DME instruction;Gait training;Functional mobility training;Therapeutic exercise;Therapeutic  activities;Patient/family education;Balance training    PT Goals (Current goals can be found in the Care Plan section)  Acute Rehab PT Goals Patient Stated Goal: to go home today PT Goal Formulation: With patient Time For Goal Achievement: 06/19/20 Potential to Achieve Goals: Good    Frequency Min 3X/week   Barriers to discharge        Co-evaluation               AM-PAC PT "6 Clicks" Mobility  Outcome Measure Help needed turning from your back to your side while in a flat bed without using bedrails?: A Little Help needed moving from lying on your back to sitting on the side of a flat bed without using bedrails?: A Little Help needed moving to and from a bed to a chair (including a wheelchair)?: A Little Help needed standing up from a chair using your arms (e.g., wheelchair or bedside chair)?: A Little Help needed to walk in hospital room?: A Little Help needed climbing 3-5 steps with a railing? : A Little 6 Click Score: 18    End of Session Equipment Utilized During Treatment: Gait belt;Oxygen Activity Tolerance: Patient tolerated treatment well Patient left: in bed;with call bell/phone within reach Nurse Communication: Mobility status PT Visit Diagnosis: Muscle weakness (generalized) (M62.81)    Time: 5465-0354 PT Time Calculation (min) (ACUTE ONLY): 28 min   Charges:   PT Evaluation $PT Eval Moderate Complexity: 1 Mod PT Treatments $Gait Training: 8-22 mins        Cindee Salt, DPT  Acute Rehabilitation Services  Pager: [phone] Office: [phone]   Lehman Prom 06/06/2020, 11:47 AM

## 2020-06-07 ENCOUNTER — Other Ambulatory Visit (HOSPITAL_COMMUNITY): Payer: Self-pay

## 2020-06-07 DIAGNOSIS — I4891 Unspecified atrial fibrillation: Secondary | ICD-10-CM | POA: Diagnosis not present

## 2020-06-07 DIAGNOSIS — I480 Paroxysmal atrial fibrillation: Secondary | ICD-10-CM | POA: Diagnosis not present

## 2020-06-07 LAB — CBC
HCT: 38.5 % — ABNORMAL LOW (ref 39.0–52.0)
Hemoglobin: 12.2 g/dL — ABNORMAL LOW (ref 13.0–17.0)
MCH: 29.8 pg (ref 26.0–34.0)
MCHC: 31.7 g/dL (ref 30.0–36.0)
MCV: 93.9 fL (ref 80.0–100.0)
Platelets: 184 10*3/uL (ref 150–400)
RBC: 4.1 MIL/uL — ABNORMAL LOW (ref 4.22–5.81)
RDW: 12.5 % (ref 11.5–15.5)
WBC: 11.3 10*3/uL — ABNORMAL HIGH (ref 4.0–10.5)
nRBC: 0 % (ref 0.0–0.2)

## 2020-06-07 LAB — COMPREHENSIVE METABOLIC PANEL
ALT: 11 U/L (ref 0–44)
AST: 17 U/L (ref 15–41)
Albumin: 3.3 g/dL — ABNORMAL LOW (ref 3.5–5.0)
Alkaline Phosphatase: 45 U/L (ref 38–126)
Anion gap: 10 (ref 5–15)
BUN: 15 mg/dL (ref 8–23)
CO2: 32 mmol/L (ref 22–32)
Calcium: 8.8 mg/dL — ABNORMAL LOW (ref 8.9–10.3)
Chloride: 94 mmol/L — ABNORMAL LOW (ref 98–111)
Creatinine, Ser: 1.02 mg/dL (ref 0.61–1.24)
GFR, Estimated: >60 mL/min (ref >60)
Glucose, Bld: 103 mg/dL — ABNORMAL HIGH (ref 70–99)
Potassium: 3.9 mmol/L (ref 3.5–5.1)
Sodium: 136 mmol/L (ref 135–145)
Total Bilirubin: 0.4 mg/dL (ref 0.3–1.2)
Total Protein: 5.6 g/dL — ABNORMAL LOW (ref 6.5–8.1)

## 2020-06-07 LAB — MAGNESIUM: Magnesium: 1.9 mg/dL (ref 1.7–2.4)

## 2020-06-07 MED ORDER — MOMETASONE FURO-FORMOTEROL FUM 200-5 MCG/ACT IN AERO: 2.0000 | INHALATION_SPRAY | Freq: Two times a day (BID) | RESPIRATORY_TRACT | 1 refills | Status: DC

## 2020-06-07 MED ORDER — METOPROLOL SUCCINATE ER 50 MG PO TB24
50.0000 mg | ORAL_TABLET | Freq: Every day | ORAL | 3 refills | Status: DC
  Filled 2020-06-07: qty 90, 90d supply, fill #0

## 2020-06-07 MED ORDER — FUROSEMIDE 40 MG PO TABS: 20.0000 mg | ORAL_TABLET | Freq: Every day | ORAL | 6 refills | Status: DC

## 2020-06-07 MED ORDER — DILTIAZEM HCL ER COATED BEADS 240 MG PO CP24: 240.0000 mg | ORAL_CAPSULE | Freq: Every day | ORAL | 3 refills | Status: DC

## 2020-06-07 MED ORDER — DABIGATRAN ETEXILATE MESYLATE 150 MG PO CAPS
150.0000 mg | ORAL_CAPSULE | Freq: Two times a day (BID) | ORAL | 12 refills | Status: DC
  Filled 2020-06-07: qty 60, 30d supply, fill #0

## 2020-06-07 MED ORDER — DILTIAZEM HCL ER COATED BEADS 240 MG PO CP24
240.0000 mg | ORAL_CAPSULE | Freq: Every day | ORAL | 3 refills | Status: DC
Start: 2020-06-07 — End: 2022-03-28
  Filled 2020-06-07: qty 90, 90d supply, fill #0

## 2020-06-07 MED ORDER — BREO ELLIPTA 100-25 MCG/INH IN AEPB
1.0000 | INHALATION_SPRAY | Freq: Two times a day (BID) | RESPIRATORY_TRACT | 1 refills | Status: DC
Start: 2020-06-07 — End: 2020-06-07

## 2020-06-07 MED ORDER — FUROSEMIDE 40 MG PO TABS
20.0000 mg | ORAL_TABLET | Freq: Every day | ORAL | 6 refills | Status: DC
  Filled 2020-06-07: qty 15, 30d supply, fill #0

## 2020-06-07 MED ORDER — BREO ELLIPTA 100-25 MCG/INH IN AEPB
1.0000 | INHALATION_SPRAY | Freq: Two times a day (BID) | RESPIRATORY_TRACT | 1 refills | Status: DC
  Filled 2020-06-07: qty 60, 30d supply, fill #0

## 2020-06-07 MED ORDER — ALBUTEROL SULFATE HFA 108 (90 BASE) MCG/ACT IN AERS
1.0000 | INHALATION_SPRAY | Freq: Four times a day (QID) | RESPIRATORY_TRACT | 1 refills | Status: DC | PRN
  Filled 2020-06-07: qty 18, 14d supply, fill #0

## 2020-06-07 MED ORDER — TIOTROPIUM BROMIDE MONOHYDRATE 18 MCG IN CAPS
1.0000 | ORAL_CAPSULE | Freq: Every day | RESPIRATORY_TRACT | 3 refills | Status: DC
  Filled 2020-06-07: qty 30, 30d supply, fill #0

## 2020-06-07 MED ORDER — METOPROLOL SUCCINATE ER 50 MG PO TB24: 50.0000 mg | ORAL_TABLET | Freq: Every day | ORAL | 3 refills | Status: DC

## 2020-06-07 MED ORDER — ALBUTEROL SULFATE HFA 108 (90 BASE) MCG/ACT IN AERS: 1.0000 | INHALATION_SPRAY | Freq: Four times a day (QID) | RESPIRATORY_TRACT | 1 refills | Status: DC | PRN

## 2020-06-07 MED ORDER — TIOTROPIUM BROMIDE MONOHYDRATE 18 MCG IN CAPS: 1.0000 | ORAL_CAPSULE | Freq: Every day | RESPIRATORY_TRACT | 3 refills | Status: DC

## 2020-06-07 MED ORDER — MOMETASONE FURO-FORMOTEROL FUM 200-5 MCG/ACT IN AERO
2.0000 | INHALATION_SPRAY | Freq: Two times a day (BID) | RESPIRATORY_TRACT | 1 refills | Status: DC
  Filled 2020-06-07: qty 13, fill #0

## 2020-06-07 MED ORDER — DABIGATRAN ETEXILATE MESYLATE 150 MG PO CAPS: 150.0000 mg | ORAL_CAPSULE | Freq: Two times a day (BID) | ORAL | 12 refills | Status: DC

## 2020-06-07 NOTE — Progress Notes (Signed)
SATURATION QUALIFICATIONS: (This note is used to comply with regulatory documentation for home oxygen)  Patient Saturations on Room Air at Rest = 80%  Patient Saturations on Room Air while Ambulating =77%  Patient Saturations on 3 Liters of oxygen while Ambulating = 93%  Please briefly explain why patient needs home oxygen:  Patient with COPD and CHF.  Patient has been on constant 3 liters of oxygen for at least the last 2 years.  Patient saturations down to 85 while talking and breathing through mouth.  Will go to 90-92 when told to stop talking and breathe through his nose.

## 2020-06-07 NOTE — TOC Transition Note (Addendum)
Transition of Care [facility]) - CM/SW Discharge Note   Patient Details  Name: Johnny Rodriguez MRN: [id number] Date of Birth: 07-Dec-1950  Transition of Care [facility]) CM/SW Contact:  Leone Haven, RN Phone Number: 06/07/2020, 12:14 PM   Clinical Narrative:    Patient is for dc today, he will need oxygen, He states he has had oxygen with [employer] and would like to have oxygen with them again.  NCM informed him the company name is now [employer] and NCM will make referral to them.  NCM made referral to [facility] with [facility] for oxygen.  NCM offered choice for [facility] services, he states he does not have a preference.  NCM made referral to [facility] with Frances Furbish, he is able to take referral.  Soc will begin 24 to 48 hrs post dc.  TOC to fill meds also , patient states he only has 20.00,  His meds will cost around 17.00 per Alinda Money with TOC.  Transport will be set up for patient. NCM scheduled follow up apt for patient at the [facility] clinic .  He states he will need transportation to  Go to the appt, will ask CSW to give him a bus pass also for this apt. NCM also contacted Erskine Squibb about transportation.   Final next level of care: Home w Home Health Services Barriers to Discharge: No Barriers Identified   Patient Goals and CMS Choice Patient states their goals for this hospitalization and ongoing recovery are:: return home with [facility] CMS Medicare.gov Compare Post Acute Care list provided to:: Patient Choice offered to / list presented to : Patient  Discharge Placement                       Discharge Plan and Services                DME Arranged: Oxygen DME Agency: [employer] Date DME Agency Contacted: 06/07/20 Time DME Agency Contacted: 1214 Representative spoke with at DME Agency: Ian Malkin HH Arranged: PT,OT HH Agency: [facility] Date [facility] Agency Contacted: 06/07/20 Time HH Agency Contacted: 1214 Representative spoke with at [facility] Agency: Kandee Keen  Social Determinants of Health (SDOH) Interventions      Readmission Risk Interventions No flowsheet data found.

## 2020-06-07 NOTE — Plan of Care (Signed)
  Problem: Education: Goal: Knowledge of General Education information will improve Description: Including pain rating scale, medication(s)/side effects and non-pharmacologic comfort measures Outcome: Adequate for Discharge   Problem: Clinical Measurements: Goal: Ability to maintain clinical measurements within normal limits will improve Outcome: Adequate for Discharge Goal: Will remain free from infection Outcome: Adequate for Discharge Goal: Diagnostic test results will improve Outcome: Adequate for Discharge Goal: Respiratory complications will improve Outcome: Adequate for Discharge Goal: Cardiovascular complication will be avoided Outcome: Adequate for Discharge   Problem: Activity: Goal: Risk for activity intolerance will decrease Outcome: Adequate for Discharge   Problem: Nutrition: Goal: Adequate nutrition will be maintained Outcome: Adequate for Discharge   Problem: Coping: Goal: Level of anxiety will decrease Outcome: Adequate for Discharge   Problem: Elimination: Goal: Will not experience complications related to bowel motility Outcome: Adequate for Discharge Goal: Will not experience complications related to urinary retention Outcome: Adequate for Discharge   Problem: Pain Managment: Goal: General experience of comfort will improve Outcome: Adequate for Discharge   Problem: Safety: Goal: Ability to remain free from injury will improve Outcome: Adequate for Discharge   

## 2020-06-07 NOTE — Progress Notes (Signed)
Patient discharge instructions reviewed and questions answered.  Written copy given to patient. Medications delivered from [facility] pharmacy. Portable oxygen delivered to patient room for ride home.  [facility] arranged for patient and will pick up at 1425.  1500  Patient via wheelchair with portable O2 and O2 tank to waiting transportation in stable condition

## 2020-06-07 NOTE — Discharge Summary (Addendum)
Physician Discharge Summary  Johnny Rodriguez ZOX:[id number] DOB: 07-24-1950 DOA: 06/05/2020  PCP: System, Provider Not In  Admit date: 06/05/2020 Discharge date: 06/07/2020  Time spent: 25 minutes  Recommendations for Outpatient Follow-up:  1. Patient will be ordered home health PT OT and continued oxygen 2. Patient will need Chem-12, CBC in about 1 week 3. Recommend outpatient chest x-ray/low-dose CT scan as he is a current smoker 4. Recommend smoking cessation and continue to work on this in the outpatient setting   Discharge Diagnoses:  MAIN problem for hospitalization   atrial fibrillation with RVR in the setting of chronic noncompliance  Please see below for itemized issues addressed in HOpsital- refer to other progress notes for clarity if needed  Discharge Condition: Guarded  Diet recommendation: Low-salt  Filed Weights   06/06/20 0241  Weight: 85 kg    History of present illness:  70 year old community dwelling male CAD, COPD with chronic respiratory failure/3 L Ongoing tobacco P A. fib CHADS2 score >4 on Pradaxa HFpEF EF 45-50% 06/2019  Presented to ED 4/26 after running out of all his meds-found to have tachycardia exertional dyspnea Rx Solu-Medrol Atrovent magnesium-respiratory rate 45 Tachycardic to 130 EKG atrial fibrillation RVR CXR emphysema Potassium 2.9 Calcium 5.9 Rx metoprolol potassium and calcium in ED  Hospital Course:  Atrial fibrillation RVR on admission CHADS2 score >4 Patient noncompliant on all meds Continue Cardizem 240 mg CD, metoprolol XL 50 daily Continue Pradaxa 150 twice daily All Rx called into TOC pharmacy on d/c COPD with chronic respiratory failure Ongoing tobacco abuse Uses 2 to 3 L at baseline-patient desatted to 84 when I took him off the oxygen in the room Continue Dulera 2 puffs twice daily, Incruse Ellipta 1 puff daily, albuterol 2 puffs every 4 as needed Severe hypokalemia on admission Replaced and currently  resolved Acute superimposed on HFrEF based on echo 06/16/2019 EF 45-50% global hypokinesis Cautious resumption Aldactone 25 daily Mildly volume overloaded on exam Cut back home dose Lasix 40 daily--:> 20 daily on d/c Follow labs Stop losartan this admit Debility He apparently gets Meals on Wheels because he is unable to move around because of his shortness of breath He will follow in the OP setting with resources procured by CSW    Discharge Exam: Vitals:   06/07/20 0354 06/07/20 0802  BP: 92/72 98/66  Pulse: 68 (!) 54  Resp: 17 18  Temp:  97.7 F (36.5 C)  SpO2: 97% 92%    Subj on day of d/c   Doing well-just had shower, no fever no chills SOB is better  General Exam on discharge  Awake alert coherent no distress disheveled CTA B no added sound no rales no rhonchi Abdomen soft nontender no rebound no guarding No lower extremity edema Neurologically intact  Discharge Instructions   Discharge Instructions    Diet - low sodium heart healthy   Complete by: As directed    Discharge instructions   Complete by: As directed    Please ensure that you quit smoking-most people gum as it fulfills oral fixation-if you need who smoke do better with nicotine to use the patch you can and you can get this at [facility] We will asked that OT and PT come and see you in the outpatient settin-this may be difficult to accomplish because you currently are at a hotel-lets see if you can get the help that you need with your social worker caseworker Maralyn Sago who should be able to provide you with the Meals on  Wheels that she has been doingGI would recommend that you follow-up with your outpatient physician and obtain labs in about 1 to 2 weeksG I would also recommend that you completely quit any other habits as you are already on oxygen and smoking will probably worsen things overallG you have been recommended to use a walker I would recommend that you continue using that to prevent you from having  falls TakeCare have a good summer   Increase activity slowly   Complete by: As directed      Allergies as of 06/07/2020   No Known Allergies     Medication List    STOP taking these medications   cyclobenzaprine 5 MG tablet Commonly known as: FLEXERIL   FLUoxetine 20 MG capsule Commonly known as: PROZAC   folic acid 1 MG tablet Commonly known as: FOLVITE   losartan 25 MG tablet Commonly known as: Cozaar   multivitamin with minerals Tabs tablet   predniSONE 10 MG tablet Commonly known as: DELTASONE   thiamine 100 MG tablet     TAKE these medications   albuterol 108 (90 Base) MCG/ACT inhaler Commonly known as: ProAir HFA Inhale 1-2 puffs into the lungs every 6 (six) hours as needed for wheezing or shortness of breath.   Breo Ellipta 100-25 MCG/INH Aepb Generic drug: fluticasone furoate-vilanterol Inhale 1 puff into the lungs 2 (two) times daily.   dabigatran 150 MG Caps capsule Commonly known as: PRADAXA Take 1 capsule (150 mg total) by mouth 2 (two) times daily.   diltiazem 240 MG 24 hr capsule Commonly known as: CARDIZEM CD Take 1 capsule (240 mg total) by mouth daily.   divalproex 125 MG capsule Commonly known as: DEPAKOTE SPRINKLE Take 250 mg by mouth 2 (two) times daily.   furosemide 40 MG tablet Commonly known as: Lasix Take 0.5 tablets (20 mg total) by mouth daily. Medicaid [id number] O What changed: how much to take   metoprolol succinate 50 MG 24 hr tablet Commonly known as: TOPROL-XL Take 1 tablet (50 mg total) by mouth daily. Take with or immediately following a meal. Medicaid [id number] O What changed: additional instructions   mometasone-formoterol 200-5 MCG/ACT Aero Commonly known as: DULERA Inhale 2 puffs into the lungs 2 (two) times daily. Medicaid [id number] O   OXYGEN Inhale 3 L into the lungs continuous.   polyethylene glycol 17 g packet Commonly known as: MIRALAX / GLYCOLAX Take 17 g by mouth daily. What changed:   when to take  this  reasons to take this   senna 8.6 MG Tabs tablet Commonly known as: SENOKOT Take 1-2 tablets (8.6-17.2 mg total) by mouth daily. What changed:   when to take this  reasons to take this   simvastatin 20 MG tablet Commonly known as: ZOCOR Take 20 mg by mouth daily.   spironolactone 25 MG tablet Commonly known as: ALDACTONE Take 1 tablet (25 mg total) by mouth daily.   tiotropium 18 MCG inhalation capsule Commonly known as: SPIRIVA Place 1 capsule (18 mcg total) into inhaler and inhale daily.   traZODone 50 MG tablet Commonly known as: DESYREL Take 50 mg by mouth at bedtime.            Durable Medical Equipment  (From admission, onward)         Start     Ordered   06/07/20 0942  DME Oxygen  Once       Question Answer Comment  Length of Need Lifetime   Mode or (Route) Nasal cannula  Liters per Minute 2   Oxygen delivery system Gas      06/07/20 0944         No Known Allergies    The results of significant diagnostics from this hospitalization (including imaging, microbiology, ancillary and laboratory) are listed below for reference.    Significant Diagnostic Studies: DG Chest 2 View  Result Date: 06/05/2020 CLINICAL DATA:  Shortness of breath on exertion for several weeks. Current smoker. EXAM: CHEST - 2 VIEW COMPARISON:  12/16/2019 FINDINGS: Heart size and pulmonary vascularity are normal. Emphysematous changes and probable fibrosis in the lungs. No airspace disease or consolidation. No pleural effusions. No pneumothorax. Mediastinal contours appear intact. Calcification of the aorta. Degenerative changes in the spine. IMPRESSION: Emphysematous changes and fibrosis in the lungs. No evidence of active pulmonary disease. Electronically Signed   By: Burman Nieves M.D.   On: 06/05/2020 22:18    Microbiology: Recent Results (from the past 240 hour(s))  SARS CORONAVIRUS 2 (TAT 6-24 HRS) Nasopharyngeal Nasopharyngeal Swab     Status: None   Collection  Time: 06/06/20  1:29 AM   Specimen: Nasopharyngeal Swab  Result Value Ref Range Status   SARS Coronavirus 2 NEGATIVE NEGATIVE Final    Comment: (NOTE) SARS-CoV-2 target nucleic acids are NOT DETECTED.  The SARS-CoV-2 RNA is generally detectable in upper and lower respiratory specimens during the acute phase of infection. Negative results do not preclude SARS-CoV-2 infection, do not rule out co-infections with other pathogens, and should not be used as the sole basis for treatment or other patient management decisions. Negative results must be combined with clinical observations, patient history, and epidemiological information. The expected result is Negative.  Fact Sheet for Patients: HairSlick.no  Fact Sheet for Healthcare Providers: quierodirigir.com  This test is not yet approved or cleared by the [place] FDA and  has been authorized for detection and/or diagnosis of SARS-CoV-2 by FDA under an Emergency Use Authorization (EUA). This EUA will remain  in effect (meaning this test can be used) for the duration of the COVID-19 declaration under Se ction 564(b)(1) of the Act, 21 U.S.C. section 360bbb-3(b)(1), unless the authorization is terminated or revoked sooner.  Performed at [facility], [address] [place], [place] [address]   MRSA PCR Screening     Status: None   Collection Time: 06/06/20  3:34 AM  Result Value Ref Range Status   MRSA by PCR NEGATIVE NEGATIVE Final    Comment:        The GeneXpert MRSA Assay (FDA approved for NASAL specimens only), is one component of a comprehensive MRSA colonization surveillance program. It is not intended to diagnose MRSA infection nor to guide or monitor treatment for MRSA infections. Performed at [facility], [address] [place], [place] [address]      Labs: Basic Metabolic Panel: Recent Labs  Lab 06/05/20 1934 06/06/20 0005  06/06/20 0250 06/07/20 0014  NA 139  --  139 136  K 2.9*  --  4.6 3.9  CL 108  --  94* 94*  CO2 25  --  36* 32  GLUCOSE 110*  --  200* 103*  BUN 7*  --  8 15  CREATININE 0.63  --  1.02 1.02  CALCIUM 5.9*  --  9.6 8.8*  MG  --  2.2 2.2 1.9   Liver Function Tests: Recent Labs  Lab 06/05/20 1934 06/06/20 0250 06/07/20 0014  AST 20 21 17   ALT 13 16 11  ALKPHOS 35* 51 45  BILITOT 0.8 0.5 0.4  PROT 4.1* 6.5 5.6*  ALBUMIN 2.4* 3.7 3.3*   Recent Labs  Lab 06/05/20 1934  LIPASE 25   No results for input(s): AMMONIA in the last 168 hours. CBC: Recent Labs  Lab 06/05/20 1934 06/06/20 0250 06/07/20 0014  WBC 4.8 4.7 11.3*  NEUTROABS 4.0  --   --   HGB 10.0* 13.5 12.2*  HCT 32.8* 43.6 38.5*  MCV 99.4 96.5 93.9  PLT 144* 200 184   Cardiac Enzymes: No results for input(s): CKTOTAL, CKMB, CKMBINDEX, TROPONINI in the last 168 hours. BNP: BNP (last 3 results) Recent Labs    06/15/19 1533 09/05/19 1838 06/05/20 1934  BNP 55.8 54.4 13.7    ProBNP (last 3 results) No results for input(s): PROBNP in the last 8760 hours.  CBG: No results for input(s): GLUCAP in the last 168 hours.     Signed:  Rhetta Mura MD   [facility] 06/07/2020, 9:44 AM

## 2020-06-08 LAB — CALCIUM, IONIZED: Calcium, Ionized, Serum: 4.7 mg/dL (ref 4.5–5.6)

## 2020-06-11 ENCOUNTER — Other Ambulatory Visit (HOSPITAL_COMMUNITY): Payer: Self-pay

## 2020-06-14 ENCOUNTER — Other Ambulatory Visit (HOSPITAL_BASED_OUTPATIENT_CLINIC_OR_DEPARTMENT_OTHER): Payer: Self-pay

## 2020-06-14 ENCOUNTER — Other Ambulatory Visit (HOSPITAL_COMMUNITY): Payer: Self-pay

## 2020-06-14 ENCOUNTER — Other Ambulatory Visit: Payer: Self-pay

## 2020-06-14 ENCOUNTER — Telehealth: Payer: Self-pay | Admitting: Pharmacist

## 2020-06-14 NOTE — Telephone Encounter (Signed)
Pharmacy Transitions of Care Follow-up Telephone Call  Date of discharge: 06/07/2020  Discharge Diagnosis: AFib  How have you been since you were released from the hospital? no  Medication changes made at discharge:  - START: dabigatran 150mg  bid, metoprolol succinate 50mg  qd, diltiazem er 240mg  qd  - STOPPED:   - CHANGED:   Medication changes verified by the patient? yes    Medication Accessibility:  Home Pharmacy: [facility] on [address]  Was the patient provided with refills on discharged medications? yes   Have all prescriptions been transferred from [facility] to home pharmacy? I will complete transfer of medications to [facility] on [address]  Is the patient able to afford medications? yes . Notable copays: copays will be $3/med . Eligible patient assistance: n/a    Medication Review: Pradaxa 150mg  bid - Discussed importance of taking medication around the same time everyday  - Reviewed potential DDIs with patient  - Advised patient of medications to avoid (NSAIDs, ASA)  - Educated that Tylenol (acetaminophen) will be the preferred analgesic to prevent risk of bleeding  - Emphasized importance of monitoring for signs and symptoms of bleeding (abnormal bruising, prolonged bleeding, nose bleeds, bleeding from gums, discolored urine, black tarry stools)  - Advised patient to alert all providers of anticoagulation therapy prior to starting a new medication or having a procedure   Metoprolol succinate 50mg  qd  Diltiazem er 240mg  qd   Follow-up Appointments:  PCP Hospital f/u appt confirmed? Yes Scheduled to see PCP on May 9.    If their condition worsens, is the pt aware to call PCP or go to the Emergency Dept.? yes  Final Patient Assessment: Patient was in a good mood and pleasant to talk with.  He was familiar with all of his medications and diagnosis.  He confirmed how he has been taking his medications and the importance of taking them daily.  He has an  appointment set up with his PCP and has public transportation the nurse help set up for him.  We discussed transferring his medications to his home pharmacy at [facility] on [address]

## 2020-06-15 NOTE — Progress Notes (Signed)
Patient did not show for appointment.   

## 2020-06-18 ENCOUNTER — Encounter: Payer: Medicare Other | Admitting: Family

## 2020-06-18 DIAGNOSIS — I5020 Unspecified systolic (congestive) heart failure: Secondary | ICD-10-CM

## 2020-06-18 DIAGNOSIS — Z122 Encounter for screening for malignant neoplasm of respiratory organs: Secondary | ICD-10-CM

## 2020-06-18 DIAGNOSIS — I2510 Atherosclerotic heart disease of native coronary artery without angina pectoris: Secondary | ICD-10-CM

## 2020-06-18 DIAGNOSIS — I428 Other cardiomyopathies: Secondary | ICD-10-CM

## 2020-06-18 DIAGNOSIS — I4891 Unspecified atrial fibrillation: Secondary | ICD-10-CM

## 2020-06-18 DIAGNOSIS — Z716 Tobacco abuse counseling: Secondary | ICD-10-CM

## 2020-06-18 DIAGNOSIS — E876 Hypokalemia: Secondary | ICD-10-CM

## 2020-06-18 DIAGNOSIS — Z09 Encounter for follow-up examination after completed treatment for conditions other than malignant neoplasm: Secondary | ICD-10-CM

## 2020-06-18 DIAGNOSIS — J449 Chronic obstructive pulmonary disease, unspecified: Secondary | ICD-10-CM

## 2020-06-18 DIAGNOSIS — I480 Paroxysmal atrial fibrillation: Secondary | ICD-10-CM

## 2020-06-18 DIAGNOSIS — R5381 Other malaise: Secondary | ICD-10-CM

## 2020-06-19 ENCOUNTER — Telehealth: Payer: Self-pay | Admitting: Family

## 2020-06-19 NOTE — Progress Notes (Signed)
Patient ID: Yael Angerer, male    DOB: 03/24/1950  MRN: [id number]  CC: Hospital Discharge Follow-Up  Subjective: Cataldo Cosgriff is a 70 y.o. male who presents for hospital discharge follow-up.  Visit 06/05/2020 - 06/07/2020 at [facility] per MD note: Hospital Course:  Atrial fibrillation RVR on admission CHADS2 score >4 Patient noncompliant on all meds Continue Cardizem 240 mg CD, metoprolol XL 50 daily Continue Pradaxa 150 twice daily All Rx called into [facility] pharmacy on d/c COPD with chronic respiratory failure Ongoing tobacco abuse Uses 2 to 3 L at baseline-patient desatted to 84 when I took him off the oxygen in the room Continue Dulera 2 puffs twice daily, Incruse Ellipta 1 puff daily, albuterol 2 puffs every 4 as needed Severe hypokalemia on admission Replaced and currently resolved Acute superimposed on HFrEF based on echo 06/16/2019 EF 45-50% global hypokinesis Cautious resumption Aldactone 25 daily Mildly volume overloaded on exam Cut backhome dose Lasix 40 daily--:>20 daily on d/c Follow labs Stop losartan this admit Debility He apparently gets Meals on Wheels because he is unable to move around because of his shortness of breath He will follow in the OP setting with resources procured by CSW  Please ensure that you quit smoking-most people gum as it fulfills oral fixation-if you need who smoke do better with nicotine to use the patch you can and you can get this at [facility] We will asked that OT and PT come and see you in the outpatient settin-this may be difficult to accomplish because you currently are at a hotel-lets see if you can get the help that you need with your social worker caseworker Maralyn Sago who should be able to provide you with the Meals on Wheels that she has been doingGI would recommend that you follow-up with your outpatient physician and obtain labs in about 1 to 2 weeksG I would also recommend that you completely quit any other habits as you  are already on oxygen and smoking will probably worsen things overallG you have been recommended to use a walker I would recommend that you continue using that to prevent you from having falls TakeCare have a good summer  1 Follow up with [facility]; oxygen 2 Follow up with Care, [facility]); HHPT, [facility]  06/20/2020: Reports feeling better since hospital discharge. Using 3 liters oxygen and rolling walker. Smoking 1 cigarette daily. Began smoking at 70 years old.  Living in a hotel and [employer] Whe[employer] delivering meals to him. PT and OT coming and helping 1x weekly. Requesting medium size Depends with tape on the sides.    ATRIAL FIBRILLATION FOLLOW-UP: Palpitations:  no Chest pain: comes and goes  Dyspnea on exertion:  Yes, with activity  Orthopnea:  Yes, 4 pillows  Syncope:  no Edema:  Yes, intermittent Anti-coagulation: Yes  HEART FAILURE FOLLOW-UP: Currently taking:  See med list Taking ACEI/ARB?  yes Taking beta blocker?  yes Adherence with salt restriction: Not sure because Meals on Wheels Shortness of breath?  yes, with activity Leg swelling?  Yes, intermittent  Monitoring weight at home?  yes Last echocardiogram:  07/06/2019 Last ejection fraction:  45 -50% Followed by Cardiology?  no  COPD: Oxygen use: yes Dyspnea frequency: Yes, with activity Cough frequency: sometimes  Productive cough: no   Patient Active Problem List   Diagnosis Date Noted  . Hypocalcemia   . Hypokalemia   . Atrial fibrillation with RVR (HCC) 06/05/2020  . COPD  with acute exacerbation (HCC) 03/11/2019  . AKI (acute kidney injury) (HCC) 03/10/2019  . Bowel obstruction (HCC) 03/10/2019  . Chronic respiratory failure with hypoxia (HCC) 10/15/2016  . Cigarette smoker 09/09/2016  . Long term current use of anticoagulant therapy 06/24/2016  . History of syncope 06/24/2016  . COPD exacerbation (HCC) 02/08/2015  . Essential hypertension 02/08/2015  . Syncope  12/10/2011  . History of noncompliance with medical treatment 12/01/2011  . Chronic systolic heart failure (HCC) 04/10/2011  . Idiopathic cardiomyopathy (HCC) 07/16/2009  . Paroxysmal atrial fibrillation (HCC) 07/16/2009  . COPD PFT's pending  07/16/2009  . CAD (coronary artery disease) 12/15/2004     Current Outpatient Medications on File Prior to Visit  Medication Sig Dispense Refill  . albuterol (PROAIR HFA) 108 (90 Base) MCG/ACT inhaler Inhale 1-2 puffs into the lungs every 6 (six) hours as needed for wheezing or shortness of breath. 18 g 1  . dabigatran (PRADAXA) 150 MG CAPS capsule Take 1 capsule (150 mg total) by mouth 2 (two) times daily. 60 capsule 12  . diltiazem (CARDIZEM CD) 240 MG 24 hr capsule Take 1 capsule (240 mg total) by mouth daily. 90 capsule 3  . divalproex (DEPAKOTE SPRINKLE) 125 MG capsule Take 250 mg by mouth 2 (two) times daily.    . furosemide (LASIX) 40 MG tablet Take 1/2 tablet (20 mg total) by mouth daily. 30 tablet 6  . metoprolol succinate (TOPROL-XL) 50 MG 24 hr tablet Take 1 tablet (50 mg total) by mouth daily. Take with or immediately following a meal. 90 tablet 3  . mometasone-formoterol (DULERA) 200-5 MCG/ACT AERO Inhale 2 puffs into the lungs 2 (two) times daily. Medicaid [id number] O 13 g 1  . OXYGEN Inhale 3 L into the lungs continuous.     . polyethylene glycol (MIRALAX / GLYCOLAX) 17 g packet Take 17 g by mouth daily. (Patient taking differently: Take 17 g by mouth daily as needed for mild constipation.) 30 each 0  . senna (SENOKOT) 8.6 MG TABS tablet Take 1-2 tablets (8.6-17.2 mg total) by mouth daily. (Patient taking differently: Take 1-2 tablets by mouth daily as needed for mild constipation.) 30 tablet 0  . tiotropium (SPIRIVA) 18 MCG inhalation capsule Place 1 capsule (18 mcg total) into inhaler and inhale daily. 90 capsule 3   No current facility-administered medications on file prior to visit.    No Known Allergies  Social History    Socioeconomic History  . Marital status: Single    Spouse name: Not on file  . Number of children: Not on file  . Years of education: Not on file  . Highest education level: Not on file  Occupational History  . Occupation: Production manager: UNEMPLOYED  Tobacco Use  . Smoking status: Current Every Day Smoker    Packs/day: 1.00    Years: 58.00    Pack years: 58.00  . Smokeless tobacco: Never Used  . Tobacco comment: pt states he is down to 1ppd  Vaping Use  . Vaping Use: Never used  Substance and Sexual Activity  . Alcohol use: Yes    Alcohol/week: 3.0 standard drinks    Types: 3 Cans of beer per week    Comment: EtOH use diorder - liquor  . Drug use: No  . Sexual activity: Not on file  Other Topics Concern  . Not on file  Social History Narrative   ** Merged History Encounter **       Social Determinants of Health  Financial Resource Strain: Not on file  Food Insecurity: Not on file  Transportation Needs: Not on file  Physical Activity: Not on file  Stress: Not on file  Social Connections: Not on file  Intimate Partner Violence: Not on file    Family History  Problem Relation Age of Onset  . Suicidality Sister   . CVA Mother   . Stroke Mother   . Heart disease Father   . Coronary artery disease Father   . COPD Father     Past Surgical History:  Procedure Laterality Date  . CARDIAC CATHETERIZATION  2006  . CARDIAC CATHETERIZATION  12/2004   Left   . CARDIOVERSION  01/21/2011   Procedure: CARDIOVERSION;  Surgeon: Darden Palmer., MD;  Location: [facility] [place] OR;  Service: Cardiovascular;  Laterality: N/A;  . DOPPLER ECHOCARDIOGRAPHY  02/2011  . ELECTROPHYSIOLOGY STUDY N/A 12/16/2011   Procedure: ELECTROPHYSIOLOGY STUDY;  Surgeon: Marinus Maw, MD;  Location: [facility] CATH LAB;  Service: Cardiovascular;  Laterality: N/A;  . EP IMPLANTABLE DEVICE N/A 08/24/2015   Procedure: Loop Recorder Removal;  Surgeon: Marinus Maw, MD;  Location: [facility] INVASIVE CV  LAB;  Service: Cardiovascular;  Laterality: N/A;  . fx collar bone    . NO PAST SURGERIES      ROS: Review of Systems Negative except as stated above  PHYSICAL EXAM: BP 107/73 (BP Location: Left Arm, Patient Position: Sitting, Cuff Size: Normal)   Pulse 96   Temp 98.4 F (36.9 C)   Resp 14   Ht 5' 10.98" (1.803 m)   Wt 191 lb 6.4 oz (86.8 kg)   SpO2 (!) 86%   BMI 26.71 kg/m   Physical Exam HENT:     Head: Normocephalic.  Eyes:     Extraocular Movements: Extraocular movements intact.     Conjunctiva/sclera: Conjunctivae normal.     Pupils: Pupils are equal, round, and reactive to light.  Cardiovascular:     Rate and Rhythm: Normal rate and regular rhythm.     Pulses: Normal pulses.     Heart sounds: Normal heart sounds.  Pulmonary:     Effort: Pulmonary effort is normal.     Breath sounds: Normal breath sounds.     Comments: Portable oxygen machine present at 3 liters. Musculoskeletal:        General: Normal range of motion.     Cervical back: Normal range of motion and neck supple.     Right lower leg: No edema.     Left lower leg: No edema.  Skin:    General: Skin is warm and dry.  Neurological:     General: No focal deficit present.     Mental Status: He is alert and oriented to person, place, and time.  Psychiatric:        Mood and Affect: Mood normal.        Behavior: Behavior normal.    ASSESSMENT AND PLAN: 1. Hospital discharge follow-up: - Improved since hospital discharge.  2. Chronic systolic heart failure (HCC): - Continue current regimen.  - Referral to Cardiology for further evaluation and management. - Ambulatory referral to Cardiology - simvastatin (ZOCOR) 20 MG tablet; Take 1 tablet (20 mg total) by mouth daily.  Dispense: 90 tablet; Refill: 0 - spironolactone (ALDACTONE) 25 MG tablet; Take 1 tablet (25 mg total) by mouth daily.  Dispense: 90 tablet; Refill: 0  3. Atrial fibrillation with RVR (HCC): - Continue current regimen.  - CMP to  check kidney function, liver function,  and electrolyte balance.  - CBC to screen for anemia. - Referral to Cardiology for further evaluation and management. - Comprehensive metabolic panel - CBC - Ambulatory referral to Cardiology  4. COPD with acute exacerbation (HCC): - Continue current regimen.  - CMP to check kidney function, liver function, and electrolyte balance.  - CBC to screen for anemia. - Referral to Pulmonology for further evaluation and management. - Comprehensive metabolic panel - CBC - Ambulatory referral to Pulmonology - fluticasone furoate-vilanterol (BREO ELLIPTA) 100-25 MCG/INH AEPB; Inhale 1 puff into the lungs daily.  Dispense: 90 each; Refill: 0  5. Chronic respiratory failure with hypoxia (HCC): - Continue current regimen. - CMP to check kidney function, liver function, and electrolyte balance.  - CBC to screen for anemia. - Referral to Pulmonology for further evaluation and management. - Comprehensive metabolic panel - CBC - Ambulatory referral to Pulmonology  6. Tobacco use: 7. Encounter for smoking cessation counseling: 8. Screening for malignant neoplasm of respiratory organ: 9. Personal history of nicotine dependence:  - Ongoing tobacco use. - CT chest lung cancer screening.  - CT CHEST LUNG CA SCREEN LOW DOSE W/O CM; Future  10. Insomnia, unspecified type: - Continue Trazodone as prescribed. - Follow-up with primary provider as scheduled.  - traZODone (DESYREL) 50 MG tablet; Take 1 tablet (50 mg total) by mouth at bedtime.  Dispense: 90 tablet; Refill: 0  11. Debility: - Patient reports still receiving Meals on Wheels. Also PT/OT coming to his residence about 1 time weekly.  12. Urinary incontinence, unspecified type: - Patient requesting Depends with tape. Today CMA called [facility] to confirm if we can supply patients with resources.  Patient was given the opportunity to ask questions.  Patient  verbalized understanding of the plan and was able to repeat key elements of the plan. Patient was given clear instructions to go to Emergency Department or return to medical center if symptoms don't improve, worsen, or new problems develop.The patient verbalized understanding.   Orders Placed This Encounter  Procedures  . CT CHEST LUNG CA SCREEN LOW DOSE W/O CM  . Comprehensive metabolic panel  . CBC  . Ambulatory referral to Cardiology  . Ambulatory referral to Pulmonology     Requested Prescriptions   Signed Prescriptions Disp Refills  . simvastatin (ZOCOR) 20 MG tablet 90 tablet 0    Sig: Take 1 tablet (20 mg total) by mouth daily.  Marland Kitchen spironolactone (ALDACTONE) 25 MG tablet 90 tablet 0    Sig: Take 1 tablet (25 mg total) by mouth daily.  . traZODone (DESYREL) 50 MG tablet 90 tablet 0    Sig: Take 1 tablet (50 mg total) by mouth at bedtime.  . fluticasone furoate-vilanterol (BREO ELLIPTA) 100-25 MCG/INH AEPB 90 each 0    Sig: Inhale 1 puff into the lungs daily.    Follow-up with primary provider as scheduled.   Rema Fendt, NP

## 2020-06-19 NOTE — Telephone Encounter (Signed)
Nurse Carollee Herter from [facility] called to inform provider about meds that pt does not have and would need to be filled when he comes to his appt tomorrow. Please follow up regarding this matter.  Sinvastaton 20mg  daily  Spironolactone 25mg   trazedone 50mg  at bedtime  breo ellipta    [facility]'s contact number is [phone].

## 2020-06-20 ENCOUNTER — Ambulatory Visit (INDEPENDENT_AMBULATORY_CARE_PROVIDER_SITE_OTHER): Payer: Medicare Other | Admitting: Family

## 2020-06-20 ENCOUNTER — Encounter: Payer: Self-pay | Admitting: Family

## 2020-06-20 ENCOUNTER — Other Ambulatory Visit: Payer: Self-pay

## 2020-06-20 VITALS — BP 107/73 | HR 96 | Temp 98.4°F | Resp 14 | Ht 70.98 in | Wt 191.4 lb

## 2020-06-20 DIAGNOSIS — F17210 Nicotine dependence, cigarettes, uncomplicated: Secondary | ICD-10-CM | POA: Diagnosis not present

## 2020-06-20 DIAGNOSIS — R5381 Other malaise: Secondary | ICD-10-CM

## 2020-06-20 DIAGNOSIS — I4891 Unspecified atrial fibrillation: Secondary | ICD-10-CM

## 2020-06-20 DIAGNOSIS — Z720 Tobacco use: Secondary | ICD-10-CM

## 2020-06-20 DIAGNOSIS — I5022 Chronic systolic (congestive) heart failure: Secondary | ICD-10-CM

## 2020-06-20 DIAGNOSIS — J9611 Chronic respiratory failure with hypoxia: Secondary | ICD-10-CM

## 2020-06-20 DIAGNOSIS — J441 Chronic obstructive pulmonary disease with (acute) exacerbation: Secondary | ICD-10-CM | POA: Diagnosis not present

## 2020-06-20 DIAGNOSIS — Z09 Encounter for follow-up examination after completed treatment for conditions other than malignant neoplasm: Secondary | ICD-10-CM

## 2020-06-20 DIAGNOSIS — Z87891 Personal history of nicotine dependence: Secondary | ICD-10-CM

## 2020-06-20 DIAGNOSIS — Z122 Encounter for screening for malignant neoplasm of respiratory organs: Secondary | ICD-10-CM

## 2020-06-20 DIAGNOSIS — Z716 Tobacco abuse counseling: Secondary | ICD-10-CM

## 2020-06-20 DIAGNOSIS — R32 Unspecified urinary incontinence: Secondary | ICD-10-CM

## 2020-06-20 DIAGNOSIS — G4700 Insomnia, unspecified: Secondary | ICD-10-CM

## 2020-06-20 NOTE — Progress Notes (Signed)
HFU  Left shoulder pain

## 2020-06-21 LAB — COMPREHENSIVE METABOLIC PANEL
ALT: 20 IU/L (ref 0–44)
AST: 23 IU/L (ref 0–40)
Albumin/Globulin Ratio: 2.3 — ABNORMAL HIGH (ref 1.2–2.2)
Albumin: 5.1 g/dL — ABNORMAL HIGH (ref 3.8–4.8)
Alkaline Phosphatase: 83 IU/L (ref 44–121)
BUN/Creatinine Ratio: 17 (ref 10–24)
BUN: 18 mg/dL (ref 8–27)
Bilirubin Total: 0.3 mg/dL (ref 0.0–1.2)
CO2: 28 mmol/L (ref 20–29)
Calcium: 9.6 mg/dL (ref 8.6–10.2)
Chloride: 98 mmol/L (ref 96–106)
Creatinine, Ser: 1.05 mg/dL (ref 0.76–1.27)
Globulin, Total: 2.2 g/dL (ref 1.5–4.5)
Glucose: 83 mg/dL (ref 65–99)
Potassium: 4.6 mmol/L (ref 3.5–5.2)
Sodium: 143 mmol/L (ref 134–144)
Total Protein: 7.3 g/dL (ref 6.0–8.5)
eGFR: 77 mL/min/{1.73_m2} (ref >59)

## 2020-06-21 LAB — CBC
Hematocrit: 45.1 % (ref 37.5–51.0)
Hemoglobin: 14.6 g/dL (ref 13.0–17.7)
MCH: 29.8 pg (ref 26.6–33.0)
MCHC: 32.4 g/dL (ref 31.5–35.7)
MCV: 92 fL (ref 79–97)
Platelets: 244 10*3/uL (ref 150–450)
RBC: 4.9 x10E6/uL (ref 4.14–5.80)
RDW: 12.3 % (ref 11.6–15.4)
WBC: 7.4 10*3/uL (ref 3.4–10.8)

## 2020-06-21 NOTE — Progress Notes (Signed)
Kidney function normal.   Liver function normal.   No anemia.

## 2020-06-22 ENCOUNTER — Other Ambulatory Visit (HOSPITAL_COMMUNITY): Payer: Self-pay

## 2020-06-22 MED ORDER — BREO ELLIPTA 100-25 MCG/INH IN AEPB
1.0000 | INHALATION_SPRAY | Freq: Every day | RESPIRATORY_TRACT | 0 refills | Status: DC
Start: 2020-06-22 — End: 2020-08-09
  Filled 2020-06-22: qty 90, 90d supply, fill #0

## 2020-06-22 MED ORDER — SIMVASTATIN 20 MG PO TABS
20.0000 mg | ORAL_TABLET | Freq: Every day | ORAL | 0 refills | Status: DC
  Filled 2020-06-22: qty 90, 90d supply, fill #0

## 2020-06-22 MED ORDER — SPIRONOLACTONE 25 MG PO TABS
25.0000 mg | ORAL_TABLET | Freq: Every day | ORAL | 0 refills | Status: DC
  Filled 2020-06-22: qty 90, 90d supply, fill #0

## 2020-06-22 MED ORDER — TRAZODONE HCL 50 MG PO TABS
50.0000 mg | ORAL_TABLET | Freq: Every day | ORAL | 0 refills | Status: DC
  Filled 2020-06-22: qty 90, 90d supply, fill #0

## 2020-06-26 ENCOUNTER — Other Ambulatory Visit (HOSPITAL_COMMUNITY): Payer: Self-pay

## 2020-06-26 ENCOUNTER — Telehealth: Payer: Self-pay | Admitting: Family

## 2020-06-26 NOTE — Telephone Encounter (Signed)
Carollee Herter from [facility] called in stating pt needs refills on his medications  furosemide (LASIX) 40 MG tablet - asking if he can get them in 20 mg instead since he can't see and needs to take 1/2 a tablet at a time simvastatin (ZOCOR) 20 MG tablet  spironolactone (ALDACTONE) 25 MG tablet traZODone (DESYREL) 50 MG tablet    Pharmacy  [facility] [phone] - [place], [place] - [address] AT [facility] [address]  [address] [place] [address]  Phone:  [phone] Fax:  [phone]  DEA #:  [id number]

## 2020-06-27 ENCOUNTER — Other Ambulatory Visit: Payer: Self-pay | Admitting: Family

## 2020-06-27 DIAGNOSIS — G4700 Insomnia, unspecified: Secondary | ICD-10-CM

## 2020-06-27 DIAGNOSIS — I5022 Chronic systolic (congestive) heart failure: Secondary | ICD-10-CM

## 2020-06-27 MED ORDER — FUROSEMIDE 20 MG PO TABS: 20.0000 mg | ORAL_TABLET | Freq: Every day | ORAL | 0 refills | Status: DC

## 2020-06-27 MED ORDER — SIMVASTATIN 20 MG PO TABS: 20.0000 mg | ORAL_TABLET | Freq: Every day | ORAL | 0 refills | Status: DC

## 2020-06-27 MED ORDER — SPIRONOLACTONE 25 MG PO TABS: 25.0000 mg | ORAL_TABLET | Freq: Every day | ORAL | 0 refills | Status: DC

## 2020-06-27 MED ORDER — TRAZODONE HCL 50 MG PO TABS: 50.0000 mg | ORAL_TABLET | Freq: Every day | ORAL | 0 refills | Status: DC

## 2020-07-02 ENCOUNTER — Telehealth: Payer: Self-pay

## 2020-07-02 NOTE — Telephone Encounter (Signed)
Pt contacted to advise that CT of chest has been scheduled arrival 13:15pm at [facility]

## 2020-07-03 ENCOUNTER — Telehealth: Payer: Self-pay | Admitting: Family

## 2020-07-03 ENCOUNTER — Other Ambulatory Visit: Payer: Self-pay | Admitting: Family

## 2020-07-03 DIAGNOSIS — R32 Unspecified urinary incontinence: Secondary | ICD-10-CM

## 2020-07-03 MED ORDER — PROCARE ADULT BRIEFS MEDIUM MISC: 25.0000 | 6 refills | Status: AC | PRN

## 2020-07-03 NOTE — Telephone Encounter (Signed)
Incontinence briefs ordered per patient request.   Also, if we can connect with [facility] to see if patient is eligible for [employer] incontinence supplies and any steps we should take to assist with this.

## 2020-07-03 NOTE — Telephone Encounter (Signed)
Contact number: [phone] Case Manager with [facility] [place], [place], spoke w/ Pt and Pt said he saw  Amy on the11 th and he was told PCP'd order Incontenence Briefs.Pt stated she'd send that in , sent to DME Provider that will deliver because he has transportation issues but he's been told they haven't received the order. Please advise and thank you.

## 2020-07-10 ENCOUNTER — Ambulatory Visit (HOSPITAL_COMMUNITY)
Admission: RE | Admit: 2020-07-10 | Discharge: 2020-07-10 | Disposition: A | Discharge status: Home / Self Care | Payer: Medicare Other | Source: Ambulatory Visit | Attending: Family | Admitting: Family

## 2020-07-10 ENCOUNTER — Other Ambulatory Visit: Payer: Self-pay

## 2020-07-10 DIAGNOSIS — Z122 Encounter for screening for malignant neoplasm of respiratory organs: Secondary | ICD-10-CM | POA: Insufficient documentation

## 2020-07-10 DIAGNOSIS — Z87891 Personal history of nicotine dependence: Secondary | ICD-10-CM

## 2020-07-10 NOTE — Telephone Encounter (Signed)
Case Manager with [facility] [place], [place] [phone]  called again stating  Incontenence Briefs need to be  sent to DME Provider that will deliver because he has transportation issues. Pt is unable to get to the Pharmacy  [facility] [phone] [place], [place] - [address] AT [facility] [address] & [facility]

## 2020-07-12 NOTE — Progress Notes (Signed)
Lung cancer screening benign. Repeat in 12 months.   Keep appointment with Cardiology for aortic atherosclerosis and coronary artery disease.   Keep appointment with Pulmonology for COPD.

## 2020-07-18 ENCOUNTER — Telehealth: Payer: Self-pay | Admitting: Family

## 2020-07-18 NOTE — Telephone Encounter (Signed)
Johnny Rodriguez from [facility] [phone]) is calling for PT. Johnny Rodriguez asking for authorization for the incontinence briefs sent to DME Provider- Pt has been walking around in a towel, pt cannot afford to pay for them at the moment nor does he have transportation. Please advise and thank you

## 2020-07-19 ENCOUNTER — Telehealth: Payer: Self-pay | Admitting: Family

## 2020-07-19 NOTE — Telephone Encounter (Signed)
Called pt back to schedule an appointment with Johnny Stabs NP tomorrow morning for his issues. Pt stated he took care of it and did not need an appointment. Pt said he drank some milk, took some maalox and then went to sleep and he is now better.

## 2020-07-19 NOTE — Telephone Encounter (Signed)
Encounter note from 05/11 and patient demographics sent to [employer] for urinary incontinence supplies

## 2020-07-19 NOTE — Telephone Encounter (Signed)
Carollee Herter from [facility] [phone] called stating the pt has stated for the past week he has been feeling like he has knots in his stomach and has had diarrhea for the past week. Pt has not been taking his miralax for the past week. Carollee Herter stated if the nurse or provider could please call the patient back and advise him what he should do.

## 2020-07-20 NOTE — Telephone Encounter (Signed)
Danford Bad from [facility] [phone] called asking for an update on the incontinence briefs. She stated they can be ordered through [employer] phone [phone] ext [phone] fax [phone] or [employer].

## 2020-07-23 ENCOUNTER — Inpatient Hospital Stay: Payer: Medicare Other | Admitting: Internal Medicine

## 2020-07-23 NOTE — Telephone Encounter (Signed)
Order for incontinence briefs were sent to [employer] 6/10

## 2020-07-25 NOTE — Telephone Encounter (Signed)
Johnny Rodriguez from [facility] [phone] called stating she called [employer] and they stated they did not receive an order. Fax number for [employer] is [phone].

## 2020-07-26 NOTE — Telephone Encounter (Signed)
Incontinence orders routed to [employer] thru Epic Faxed to [phone].

## 2020-07-27 ENCOUNTER — Telehealth: Payer: Self-pay | Admitting: Family

## 2020-07-27 NOTE — Telephone Encounter (Signed)
Kristie from [facility] called again and stated [employer] does not have Epic and have not received any order for the patient.

## 2020-07-27 NOTE — Telephone Encounter (Signed)
1) Medication(s) Requested (by name):  albuterol [facility] HFA) 108 (90 Base) MCG/ACT inhaler  2) Pharmacy of Choice: [facility] [phone] [place], [place] - [address] AT [facility] [address]  [address] [place] [address]  Phone:  [phone]  Fax:  [phone]  DEA #:  [id number]  3) Special Requests:   Approved medications will be sent to the pharmacy, we will reach out if there is an issue.  Requests made after 3pm may not be addressed until the following business day!  If a patient is unsure of the name of the medication(s) please note and ask patient to call back when they are able to provide all info, do not send to responsible party until all information is available!

## 2020-07-27 NOTE — Telephone Encounter (Signed)
Called Johnny Rodriguez from [facility] she was out of the office and her voicemail stated to call [name] at [phone]. I called [name] and had to leave a voicemail. Informed her that the nurse had spoke with [name] at [employer] [phone] and the patients account with [employer] had expired. The nurse has attempted to send the order to [employer] by fax [phone] and through Epic several times for the patients incontinence briefs.   Nothing can be done until he patient calls and updates his account with [employer].

## 2020-07-31 ENCOUNTER — Other Ambulatory Visit: Payer: Self-pay

## 2020-07-31 MED ORDER — ALBUTEROL SULFATE HFA 108 (90 BASE) MCG/ACT IN AERS: 1.0000 | INHALATION_SPRAY | Freq: Four times a day (QID) | RESPIRATORY_TRACT | 1 refills | Status: DC | PRN

## 2020-07-31 NOTE — Progress Notes (Signed)
Albuterol inhaler refilled 

## 2020-08-02 ENCOUNTER — Telehealth: Payer: Self-pay | Admitting: Family

## 2020-08-02 NOTE — Telephone Encounter (Signed)
Juluis Pitch from [facility] [phone] called stating patient Johnny Rodriguez is going to be discharged from home health 6.24.2022. Kriste Basque is stating she feels like the patient needs placement, such as [facility]. The patient has trouble with medication management, transportation and she feels he just needs assistance with most things. Kriste Basque states she is afraid if he is left living alone he will end up back in the hospital. Patient has been to a regular assisted living and she stated that did not work for him. Per Kriste Basque pt is also needing a referral for mental health.

## 2020-08-02 NOTE — Telephone Encounter (Signed)
Erskine Squibb if you could assist on the best way to get the patient the recommended services for [facility] per [facility]. Also, if there is a particular order I need to place for this please let me know.  Also, if we can confirm with Juluis Pitch the diagnosis for referral request to mental health. I have reviewed the patient's chart and do not see a mental health history. The mental health referral to Psychiatry will require a diagnosis and/or symptomology.

## 2020-08-03 ENCOUNTER — Other Ambulatory Visit: Payer: Self-pay | Admitting: Family

## 2020-08-03 DIAGNOSIS — G4700 Insomnia, unspecified: Secondary | ICD-10-CM

## 2020-08-03 DIAGNOSIS — J441 Chronic obstructive pulmonary disease with (acute) exacerbation: Secondary | ICD-10-CM

## 2020-08-04 ENCOUNTER — Other Ambulatory Visit: Payer: Self-pay | Admitting: Family

## 2020-08-04 DIAGNOSIS — I5022 Chronic systolic (congestive) heart failure: Secondary | ICD-10-CM

## 2020-08-06 ENCOUNTER — Telehealth: Payer: Self-pay

## 2020-08-06 NOTE — Telephone Encounter (Signed)
Reached to Johnny Rodriguez from [facility] for clarification on the request for assistance with community mental health resources. Home Health services for the patient ended on 08/03/2020.  Debbie shared the patient history:  currently living at the [facility] and has been off and on for several years. Patient does not have family support either. Patient receives about Patient does have Community Care case manager Neysa Bonito) involved to assist with medical supplies.  Debbie did share [facility] ACT Team is currently not accepting patients at this time. [facility] is listed as a contact for the patient as well. Will get clarification from the patient on which services they are assisting with.  Next Steps: LCSW to contact the patient on scheduling a behavioral consult to assessment health status.

## 2020-08-06 NOTE — Telephone Encounter (Signed)
Reached to Debbie Clapp from [facility] for clarification on the request for assistance with community mental health resources. Home Health services for the patient ended on 08/03/2020.  Debbie shared the patient history:  currently living at the [facility] and has been off and on for several years. Patient does not have family support either. Patient receives about Patient does have Community Care case manager (Christy) involved to assist with medical supplies.  Debbie did share [facility] ACT Team is currently not accepting patients at this time. [facility] is listed as a contact for the patient as well. Will get clarification from the patient on which services they are assisting with.  Next Steps: LCSW to contact the patient on scheduling a behavioral consult to assessment health status.

## 2020-08-07 ENCOUNTER — Ambulatory Visit (INDEPENDENT_AMBULATORY_CARE_PROVIDER_SITE_OTHER): Payer: Medicare Other | Admitting: Clinical

## 2020-08-07 ENCOUNTER — Other Ambulatory Visit: Payer: Self-pay

## 2020-08-07 DIAGNOSIS — F659 Paraphilia, unspecified: Secondary | ICD-10-CM

## 2020-08-07 DIAGNOSIS — F1090 Alcohol use, unspecified, uncomplicated: Secondary | ICD-10-CM

## 2020-08-07 DIAGNOSIS — F411 Generalized anxiety disorder: Secondary | ICD-10-CM

## 2020-08-07 DIAGNOSIS — Z7289 Other problems related to lifestyle: Secondary | ICD-10-CM

## 2020-08-07 DIAGNOSIS — Z789 Other specified health status: Secondary | ICD-10-CM

## 2020-08-07 NOTE — Telephone Encounter (Signed)
I have pt scheduled to meet with me today 08/07/20 at 2:30PM.
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
## 2020-08-08 ENCOUNTER — Telehealth: Payer: Self-pay | Admitting: Family

## 2020-08-08 NOTE — Telephone Encounter (Signed)
Selena from [facility] called stated patient needs refill on the following medications to be called in or faxed. Phone [phone] opt 3 or ask for Selena. Fax [phone]  fluticasone furoate-vilanterol (BREO ELLIPTA) 100-25 MCG/INH AEPB  furosemide (LASIX) 20 MG tablet  traZODone (DESYREL) 50 MG tablet  divalproex (DEPAKOTE SPRINKLE) 125 MG capsule

## 2020-08-08 NOTE — Telephone Encounter (Signed)
After meeting with pt, he would benefit from an ACTT team with intensive in home tx and attending Adult Day Care facility to increase age appropriate socialization. Can you please assist with this? Pt has not had good experience with assisted living facilities in the past (see note) and may not benefit from [facility] housing program.

## 2020-08-09 ENCOUNTER — Other Ambulatory Visit: Payer: Self-pay

## 2020-08-09 DIAGNOSIS — F411 Generalized anxiety disorder: Secondary | ICD-10-CM

## 2020-08-09 DIAGNOSIS — I5022 Chronic systolic (congestive) heart failure: Secondary | ICD-10-CM

## 2020-08-09 DIAGNOSIS — G4700 Insomnia, unspecified: Secondary | ICD-10-CM

## 2020-08-09 DIAGNOSIS — J441 Chronic obstructive pulmonary disease with (acute) exacerbation: Secondary | ICD-10-CM

## 2020-08-09 MED ORDER — FLUTICASONE FUROATE-VILANTEROL 100-25 MCG/INH IN AEPB: 1.0000 | INHALATION_SPRAY | Freq: Every day | RESPIRATORY_TRACT | 0 refills | Status: DC

## 2020-08-09 MED ORDER — DIVALPROEX SODIUM 125 MG PO CSDR: 250.0000 mg | DELAYED_RELEASE_CAPSULE | Freq: Two times a day (BID) | ORAL | 0 refills | Status: DC

## 2020-08-09 MED ORDER — FUROSEMIDE 20 MG PO TABS: 20.0000 mg | ORAL_TABLET | Freq: Every day | ORAL | 0 refills | Status: DC

## 2020-08-09 MED ORDER — TRAZODONE HCL 50 MG PO TABS: 50.0000 mg | ORAL_TABLET | Freq: Every day | ORAL | 0 refills | Status: DC

## 2020-08-09 NOTE — Progress Notes (Signed)
Rx requested refilled for pt

## 2020-08-09 NOTE — Telephone Encounter (Signed)
Refill request completed.

## 2020-08-10 ENCOUNTER — Telehealth: Payer: Self-pay

## 2020-08-10 NOTE — Telephone Encounter (Signed)
Patient has been referred to [facility] and Intensive In Home.

## 2020-08-10 NOTE — Telephone Encounter (Signed)
Patient referred to [facility] [place].

## 2020-08-14 ENCOUNTER — Telehealth: Payer: Self-pay | Admitting: Family

## 2020-08-14 NOTE — Telephone Encounter (Signed)
Pharmacist Clayburn Pert from [facility] called stating there is a medication reaction between three of the patients medications. Clayburn Pert is requesting a return call at [phone]. The patient is new to the pharmacy and he just wants to make sure it is ok to send the patients medications to him.

## 2020-08-15 NOTE — Telephone Encounter (Signed)
Clayburn Pert from [facility] called again regarding medication interaction for [name] and is requesting a call back at [phone] at your earliest convenience.

## 2020-08-16 ENCOUNTER — Institutional Professional Consult (permissible substitution): Payer: Medicare Other | Admitting: Pulmonary Disease

## 2020-08-16 NOTE — Telephone Encounter (Signed)
Patient has ongoing history of being prescribed all of these medications (some since 2015). Since he is remaining stable we can continue at current doses.   Notable side effects for patient to monitor for is myopathy, rhabdomyolysis, and any abnormal bleeding. Should patient experience any of these side effects or any otherwise please report to Emergency Department and notify me.

## 2020-08-16 NOTE — Telephone Encounter (Signed)
Spoke w/Jacob at [facility] pharmacy, the pharmacy was concerned about the interaction between Simvastatin, Dabigatran, and Diltiazem, but since they have contacted our office they have found that patient is stable on the medications after years of been on it. Did advise Gerilyn Pilgrim that pt is new to provider Ricky Stabs, NP

## 2020-08-16 NOTE — Progress Notes (Deleted)
Synopsis: Referred in July 2022 for COPD and respiratory failure by Ricky Stabs, NP  Subjective:   PATIENT ID: Johnny Rodriguez GENDER: male DOB: 11-Jul-1950, MRN: [id number]   HPI  No chief complaint on file.   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00    Types: Cigarettes   Smokeless tobacco: Never   Tobacco comments:    pt states he is down to 1ppd  Vaping Use   Vaping Use: Never used  Substance and Sexual Activity   Alcohol use: Yes    Alcohol/week: 3.0 standard drinks    Types: 3 Cans of beer per week    Comment: EtOH use diorder - liquor   Drug use: No   Sexual activity: Not on file  Other Topics Concern   Not on file  Social History Narrative   ** Merged History Encounter **       Social Determinants of Health   Financial Resource Strain: Not on file  Food Insecurity: Not on file  Transportation Needs: Not on file  Physical Activity: Not on file  Stress: Not on file  Social Connections: Not on file   Intimate Partner Violence: Not on file     No Known Allergies   Outpatient Medications Prior to Visit  Medication Sig Dispense Refill   albuterol (PROAIR HFA) 108 (90 Base) MCG/ACT inhaler Inhale 1-2 puffs into the lungs every 6 (six) hours as needed for wheezing or shortness of breath. 18 g 1   dabigatran (PRADAXA) 150 MG CAPS capsule Take 1 capsule (150 mg total) by mouth 2 (two) times daily. 60 capsule 12   diltiazem (CARDIZEM CD) 240 MG 24 hr capsule Take 1 capsule (240 mg total) by mouth daily. 90 capsule 3   divalproex (DEPAKOTE SPRINKLE) 125 MG capsule Take 2 capsules (250 mg total) by mouth 2 (two) times daily. 360 capsule 0   fluticasone furoate-vilanterol (BREO ELLIPTA) 100-25 MCG/INH AEPB Inhale 1 puff into the lungs daily. 90 each 0   furosemide (LASIX) 20 MG tablet Take 1 tablet (20 mg total) by mouth daily. 90 tablet 0   Incontinence Supply Disposable (PROCARE ADULT BRIEFS MEDIUM) MISC 25 each by Does not apply route as needed. 25 each 6   metoprolol succinate (TOPROL-XL) 50 MG  24 hr tablet Take 1 tablet (50 mg total) by mouth daily. Take with or immediately following a meal. 90 tablet 3   mometasone-formoterol (DULERA) 200-5 MCG/ACT AERO Inhale 2 puffs into the lungs 2 (two) times daily. Medicaid [id number] O 13 g 1   OXYGEN Inhale 3 L into the lungs continuous.      polyethylene glycol (MIRALAX / GLYCOLAX) 17 g packet Take 17 g by mouth daily. (Patient taking differently: Take 17 g by mouth daily as needed for mild constipation.) 30 each 0   senna (SENOKOT) 8.6 MG TABS tablet Take 1-2 tablets (8.6-17.2 mg total) by mouth daily. (Patient taking differently: Take 1-2 tablets by mouth daily as needed for mild constipation.) 30 tablet 0   simvastatin (ZOCOR) 20 MG tablet Take 1 tablet (20 mg total) by mouth daily. 90 tablet 0   spironolactone (ALDACTONE) 25 MG tablet Take 1 tablet (25 mg total) by mouth daily. 90 tablet 0   tiotropium (SPIRIVA) 18 MCG inhalation capsule Place 1  capsule (18 mcg total) into inhaler and inhale daily. 90 capsule 3   traZODone (DESYREL) 50 MG tablet Take 1 tablet (50 mg total) by mouth at bedtime. 90 tablet 0   No facility-administered medications prior to visit.    ROS    Objective:  There were no vitals filed for this visit.   Physical Exam    CBC    Component Value Date/Time   WBC 7.4 06/20/2020 1115   WBC 11.3 (H) 06/07/2020 0014   RBC 4.90 06/20/2020 1115   RBC 4.10 (L) 06/07/2020 0014   HGB 14.6 06/20/2020 1115   HCT 45.1 06/20/2020 1115   PLT 244 06/20/2020 1115   MCV 92 06/20/2020 1115   MCV 93 02/05/2014 2217   MCH 29.8 06/20/2020 1115   MCH 29.8 06/07/2020 0014   MCHC 32.4 06/20/2020 1115   MCHC 31.7 06/07/2020 0014   RDW 12.3 06/20/2020 1115   RDW 13.1 02/05/2014 2217   LYMPHSABS 0.5 (L) 06/05/2020 1934   MONOABS 0.2 06/05/2020 1934   EOSABS 0.1 06/05/2020 1934   BASOSABS 0.0 06/05/2020 1934     Chest imaging: CT Chest LCS 07/10/2020 Mediastinum/Nodes: No pathologically enlarged mediastinal or hilar lymph nodes. Please note that accurate exclusion of hilar adenopathy is limited on noncontrast CT scans. Esophagus is unremarkable in appearance. No axillary lymphadenopathy.   Lungs/Pleura: Tiny pulmonary nodules are noted, largest of which is in the posterior aspect of the right lower lobe (axial image 266 of series 3), with a volume derived mean diameter of 3.7 mm. No larger more suspicious appearing pulmonary nodules or masses are noted. No acute consolidative airspace disease. No pleural effusions. Mild diffuse bronchial wall thickening with mild centrilobular and paraseptal emphysema.  PFT: No flowsheet data found. 2018: FEV1 10% FVC 21% FEV1/FVC 58%  Echo 06/16/19: LVEF 45-50%. LV mildly decreased function. RV systolic function function is normal. RVSP is 36.1mmHg. Right atrial size is mildly dilated.     Assessment & Plan:   Chronic obstructive pulmonary disease, unspecified COPD  type (HCC)  Pulmonary emphysema, unspecified emphysema type (HCC)  Discussion: ***    Current Outpatient Medications:    albuterol (PROAIR HFA) 108 (90 Base) MCG/ACT inhaler, Inhale 1-2 puffs into the lungs every 6 (six) hours as needed for wheezing or shortness of breath., Disp: 18 g, Rfl: 1   dabigatran (PRADAXA) 150 MG CAPS capsule, Take 1 capsule (150 mg total) by mouth 2 (two) times daily., Disp: 60 capsule, Rfl: 12  diltiazem (CARDIZEM CD) 240 MG 24 hr capsule, Take 1 capsule (240 mg total) by mouth daily., Disp: 90 capsule, Rfl: 3   divalproex (DEPAKOTE SPRINKLE) 125 MG capsule, Take 2 capsules (250 mg total) by mouth 2 (two) times daily., Disp: 360 capsule, Rfl: 0   fluticasone furoate-vilanterol (BREO ELLIPTA) 100-25 MCG/INH AEPB, Inhale 1 puff into the lungs daily., Disp: 90 each, Rfl: 0   furosemide (LASIX) 20 MG tablet, Take 1 tablet (20 mg total) by mouth daily., Disp: 90 tablet, Rfl: 0   Incontinence Supply Disposable (PROCARE ADULT BRIEFS MEDIUM) MISC, 25 each by Does not apply route as needed., Disp: 25 each, Rfl: 6   metoprolol succinate (TOPROL-XL) 50 MG 24 hr tablet, Take 1 tablet (50 mg total) by mouth daily. Take with or immediately following a meal., Disp: 90 tablet, Rfl: 3   mometasone-formoterol (DULERA) 200-5 MCG/ACT AERO, Inhale 2 puffs into the lungs 2 (two) times daily. Medicaid [id number] O, Disp: 13 g, Rfl: 1   OXYGEN, Inhale 3 L into the lungs continuous. , Disp: , Rfl:    polyethylene glycol (MIRALAX / GLYCOLAX) 17 g packet, Take 17 g by mouth daily. (Patient taking differently: Take 17 g by mouth daily as needed for mild constipation.), Disp: 30 each, Rfl: 0   senna (SENOKOT) 8.6 MG TABS tablet, Take 1-2 tablets (8.6-17.2 mg total) by mouth daily. (Patient taking differently: Take 1-2 tablets by mouth daily as needed for mild constipation.), Disp: 30 tablet, Rfl: 0   simvastatin (ZOCOR) 20 MG tablet, Take 1 tablet (20 mg total) by mouth daily., Disp: 90 tablet,  Rfl: 0   spironolactone (ALDACTONE) 25 MG tablet, Take 1 tablet (25 mg total) by mouth daily., Disp: 90 tablet, Rfl: 0   tiotropium (SPIRIVA) 18 MCG inhalation capsule, Place 1 capsule (18 mcg total) into inhaler and inhale daily., Disp: 90 capsule, Rfl: 3   traZODone (DESYREL) 50 MG tablet, Take 1 tablet (50 mg total) by mouth at bedtime., Disp: 90 tablet, Rfl: 0

## 2020-08-16 NOTE — Telephone Encounter (Signed)
Eboney if you can call the pharmacy and request the following:  I have reviewed the chart and do not see where the medications the pharmacist is referring to have been clearly noted.   I will need detail specific information to advise appropriately. Specifically which medications is there concern for medication reactions. Also, what are the medication reactions. Please return call to pharmacy for these details.

## 2020-08-21 ENCOUNTER — Telehealth: Payer: Self-pay

## 2020-08-22 ENCOUNTER — Telehealth: Payer: Self-pay | Admitting: Family

## 2020-08-22 NOTE — Telephone Encounter (Signed)
Johnny Rodriguez from [facility] [phone] called asking if the forms from [employer] have been completed and faxed back to them

## 2020-08-23 NOTE — Telephone Encounter (Signed)
[employer] [facility] documentation completed. Will ask my CMA to fax back to [place] by end of business day 08/24/2020.

## 2020-08-24 NOTE — Telephone Encounter (Signed)
[employer] forms faxed

## 2020-08-24 NOTE — Telephone Encounter (Signed)
Kristi from [facility] called, informed her of message from Ricky Stabs NP. Silva Bandy stated documentation needs to go to [employer] and she will follow up with [employer] on Monday 08/27/2020.

## 2020-08-28 NOTE — Telephone Encounter (Signed)
Patient's neighbor had questions on update in change in status of the patients upcoming appt.

## 2020-09-04 ENCOUNTER — Ambulatory Visit (INDEPENDENT_AMBULATORY_CARE_PROVIDER_SITE_OTHER): Payer: Medicare Other | Admitting: Clinical

## 2020-09-04 ENCOUNTER — Other Ambulatory Visit: Payer: Self-pay

## 2020-09-04 DIAGNOSIS — Z7289 Other problems related to lifestyle: Secondary | ICD-10-CM

## 2020-09-04 DIAGNOSIS — R4681 Obsessive-compulsive behavior: Secondary | ICD-10-CM

## 2020-09-04 DIAGNOSIS — F659 Paraphilia, unspecified: Secondary | ICD-10-CM

## 2020-09-04 DIAGNOSIS — F1090 Alcohol use, unspecified, uncomplicated: Secondary | ICD-10-CM

## 2020-09-04 DIAGNOSIS — Z789 Other specified health status: Secondary | ICD-10-CM
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
## 2020-09-11 ENCOUNTER — Other Ambulatory Visit: Payer: Self-pay

## 2020-09-11 ENCOUNTER — Other Ambulatory Visit: Payer: Self-pay | Admitting: Family

## 2020-09-11 ENCOUNTER — Encounter: Payer: Self-pay | Admitting: Nurse Practitioner

## 2020-09-11 ENCOUNTER — Telehealth: Payer: Self-pay | Admitting: Family

## 2020-09-11 ENCOUNTER — Telehealth: Payer: Self-pay

## 2020-09-11 ENCOUNTER — Ambulatory Visit: Payer: Medicare Other

## 2020-09-11 ENCOUNTER — Ambulatory Visit: Payer: Medicare Other | Attending: Nurse Practitioner | Admitting: Nurse Practitioner

## 2020-09-11 VITALS — BP 130/75 | HR 112 | Resp 16 | Ht 71.0 in | Wt 191.0 lb

## 2020-09-11 DIAGNOSIS — Z0000 Encounter for general adult medical examination without abnormal findings: Secondary | ICD-10-CM

## 2020-09-11 DIAGNOSIS — J441 Chronic obstructive pulmonary disease with (acute) exacerbation: Secondary | ICD-10-CM

## 2020-09-11 MED ORDER — FLUTICASONE FUROATE-VILANTEROL 100-25 MCG/INH IN AEPB: 1.0000 | INHALATION_SPRAY | Freq: Every day | RESPIRATORY_TRACT | 0 refills | Status: DC

## 2020-09-11 MED ORDER — ALBUTEROL SULFATE HFA 108 (90 BASE) MCG/ACT IN AERS: 1.0000 | INHALATION_SPRAY | Freq: Four times a day (QID) | RESPIRATORY_TRACT | 1 refills | Status: DC | PRN

## 2020-09-11 NOTE — Telephone Encounter (Signed)
Medication refills completed .

## 2020-09-11 NOTE — Telephone Encounter (Signed)
[name] with [facility] [phone]) called on behalf of [name].  She requested the following medication refills be sent over for [name] if possible:  - Breo Ellipta - Albuterol   Thanks

## 2020-09-11 NOTE — Progress Notes (Signed)
Subjective:   Johnny Rodriguez is a 70 y.o. male who presents for Medicare Annual/Subsequent preventive examination.  Review of Systems    Review of Systems  Constitutional: Negative.   HENT: Negative.    Eyes: Negative.   Respiratory: Negative.    Cardiovascular: Negative.   Gastrointestinal: Negative.   Genitourinary: Negative.   Musculoskeletal: Negative.   Skin: Negative.   Neurological: Negative.   Endo/Heme/Allergies: Negative.   Psychiatric/Behavioral: Negative.      Cardiac Risk Factors include: advanced age (>37men, >59 women);hypertension;sedentary lifestyle;obesity (BMI >30kg/m2)     Objective:    Today's Vitals   09/11/20 1354  BP: 130/75  Pulse: (!) 112  Resp: 16  SpO2: 91%  Weight: 191 lb (86.6 kg)  Height: 5\' 11"  (1.803 m)   Body mass index is 26.64 kg/m.  Advanced Directives 09/11/2020 06/06/2020 12/10/2019 09/05/2019 06/17/2019 06/15/2019 03/25/2019  Does Patient Have a Medical Advance Directive? No No No No - No No  Would patient like information on creating a medical advance directive? No - Patient declined No - Patient declined No - Patient declined No - Patient declined No - Patient declined No - Patient declined No - Patient declined  Pre-existing out of facility DNR order (yellow form or pink MOST form) - - - - - - -    Current Medications (verified) Outpatient Encounter Medications as of 09/11/2020  Medication Sig   albuterol (PROAIR HFA) 108 (90 Base) MCG/ACT inhaler Inhale 1-2 puffs into the lungs every 6 (six) hours as needed for wheezing or shortness of breath.   dabigatran (PRADAXA) 150 MG CAPS capsule Take 1 capsule (150 mg total) by mouth 2 (two) times daily.   diltiazem (CARDIZEM CD) 240 MG 24 hr capsule Take 1 capsule (240 mg total) by mouth daily.   divalproex (DEPAKOTE SPRINKLE) 125 MG capsule Take 2 capsules (250 mg total) by mouth 2 (two) times daily.   fluticasone furoate-vilanterol (BREO ELLIPTA) 100-25 MCG/INH AEPB Inhale 1 puff into  the lungs daily.   furosemide (LASIX) 20 MG tablet Take 1 tablet (20 mg total) by mouth daily.   Incontinence Supply Disposable (PROCARE ADULT BRIEFS MEDIUM) MISC 25 each by Does not apply route as needed.   metoprolol succinate (TOPROL-XL) 50 MG 24 hr tablet Take 1 tablet (50 mg total) by mouth daily. Take with or immediately following a meal.   mometasone-formoterol (DULERA) 200-5 MCG/ACT AERO Inhale 2 puffs into the lungs 2 (two) times daily. Medicaid 11/11/2020 O   OXYGEN Inhale 3 L into the lungs continuous.    polyethylene glycol (MIRALAX / GLYCOLAX) 17 g packet Take 17 g by mouth daily. (Patient taking differently: Take 17 g by mouth daily as needed for mild constipation.)   senna (SENOKOT) 8.6 MG TABS tablet Take 1-2 tablets (8.6-17.2 mg total) by mouth daily. (Patient taking differently: Take 1-2 tablets by mouth daily as needed for mild constipation.)   simvastatin (ZOCOR) 20 MG tablet Take 1 tablet (20 mg total) by mouth daily.   spironolactone (ALDACTONE) 25 MG tablet Take 1 tablet (25 mg total) by mouth daily.   tiotropium (SPIRIVA) 18 MCG inhalation capsule Place 1 capsule (18 mcg total) into inhaler and inhale daily.   traZODone (DESYREL) 50 MG tablet Take 1 tablet (50 mg total) by mouth at bedtime.   No facility-administered encounter medications on file as of 09/11/2020.    Allergies (verified) Patient has no known allergies.   History: Past Medical History:  Diagnosis Date   AF (atrial fibrillation) (  HCC)    Alcohol abuse    Angina pectoris    CAD (coronary artery disease) 12/15/2004   COPD (chronic obstructive pulmonary disease) (HCC)    Coronary artery disease    Heavy cigarette smoker    HTN (hypertension)    Idiopathic cardiomyopathy (HCC) 07/16/2009   Non compliance with medical treatment    Seizures [facility])    Past Surgical History:  Procedure Laterality Date   CARDIAC CATHETERIZATION  2006   CARDIAC CATHETERIZATION  12/2004   Left    CARDIOVERSION  01/21/2011    Procedure: CARDIOVERSION;  Surgeon: Darden Palmer., MD;  Location: [facility] OR;  Service: Cardiovascular;  Laterality: N/A;   DOPPLER ECHOCARDIOGRAPHY  02/2011   ELECTROPHYSIOLOGY STUDY N/A 12/16/2011   Procedure: ELECTROPHYSIOLOGY STUDY;  Surgeon: Marinus Maw, MD;  Location: [facility] CATH LAB;  Service: Cardiovascular;  Laterality: N/A;   EP IMPLANTABLE DEVICE N/A 08/24/2015   Procedure: Loop Recorder Removal;  Surgeon: Marinus Maw, MD;  Location: [facility] INVASIVE CV LAB;  Service: Cardiovascular;  Laterality: N/A;   fx collar bone     NO PAST SURGERIES     Family History  Problem Relation Age of Onset   Suicidality Sister    CVA Mother    Stroke Mother    Heart disease Father    Coronary artery disease Father    COPD Father    Social History   Socioeconomic History   Marital status: Single    Spouse name: Not on file   Number of children: Not on file   Years of education: Not on file   Highest education level: Not on file  Occupational History   Occupation: Production manager: UNEMPLOYED  Tobacco Use   Smoking status: Every Day    Packs/day: 1.00    Years: 58.00    Pack years: 58.00    Types: Cigarettes   Smokeless tobacco: Never   Tobacco comments:    pt states he is down to 1ppd  Vaping Use   Vaping Use: Never used  Substance and Sexual Activity   Alcohol use: Yes    Alcohol/week: 3.0 standard drinks    Types: 3 Cans of beer per week    Comment: EtOH use diorder - liquor   Drug use: No   Sexual activity: Not on file  Other Topics Concern   Not on file  Social History Narrative   ** Merged History Encounter **       Social Determinants of Health   Financial Resource Strain: Low Risk    Difficulty of Paying Living Expenses: Not hard at all  Food Insecurity: No Food Insecurity   Worried About Programme researcher, broadcasting/film/video in the Last Year: Never true   Barista in the Last Year: Never true  Transportation Needs: Unmet Transportation Needs   Lack of  Transportation (Medical): Yes   Lack of Transportation (Non-Medical): Yes  Physical Activity: Inactive   Days of Exercise per Week: 0 days   Minutes of Exercise per Session: 0 min  Stress: Stress Concern Present   Feeling of Stress : To some extent  Social Connections: Socially Isolated   Frequency of Communication with Friends and Family: Never   Frequency of Social Gatherings with Friends and Family: Never   Attends Religious Services: Never   Database administrator or Organizations: No   Attends Banker Meetings: Never   Marital Status: Never married    Tobacco Counseling  Ready to quit: No Counseling given: Yes Tobacco comments: pt states he is down to 1ppd   Clinical Intake:  Pre-visit preparation completed: No        BMI - recorded: 26.64 Nutritional Status: BMI 25 -29 Overweight Nutritional Risks: None Diabetes: No  How often do you need to have someone help you when you read instructions, pamphlets, or other written materials from your doctor or pharmacy?: 1 - Never What is the last grade level you completed in school?: 8th  Diabetic? no  Interpreter Needed?: No      Activities of Daily Living In your present state of health, do you have any difficulty performing the following activities: 09/11/2020 06/06/2020  Hearing? N N  Vision? - N  Difficulty concentrating or making decisions? N N  Walking or climbing stairs? - Y  Dressing or bathing? N N  Doing errands, shopping? N Y  Quarry manager and eating ? N -  Using the Toilet? N -  In the past six months, have you accidently leaked urine? Y -  Do you have problems with loss of bowel control? Y -  Managing your Medications? N -  Managing your Finances? N -  Housekeeping or managing your Housekeeping? N -  Some recent data might be hidden    Patient Care Team: Rema Fendt, NP as PCP - General (Nurse Practitioner) Marinus Maw, MD as PCP - Electrophysiology (Cardiology) Gwenyth Bender, MD (Internal Medicine)  Indicate any recent Medical Services you may have received from other than [facility] providers in the past year (date may be approximate).     Assessment:   This is a routine wellness examination for Deryck.  Hearing/Vision screen No results found.  Dietary issues and exercise activities discussed: Current Exercise Habits: The patient does not participate in regular exercise at present, Exercise limited by: orthopedic condition(s)   Goals Addressed   None    Depression Screen PHQ 2/9 Scores 09/11/2020 08/07/2020 06/20/2020  PHQ - 2 Score 2 3 0  PHQ- 9 Score 12 6 -    Fall Risk Fall Risk  09/11/2020 06/20/2020  Falls in the past year? 0 1  Number falls in past yr: 0 0  Injury with Fall? 0 0  Risk for fall due to : Impaired mobility;Impaired balance/gait No Fall Risks  Follow up Education provided Falls evaluation completed    FALL RISK PREVENTION PERTAINING TO THE HOME:  Any stairs in or around the home? No  If so, are there any without handrails? No  Home free of loose throw rugs in walkways, pet beds, electrical cords, etc? Yes  Adequate lighting in your home to reduce risk of falls? Yes   ASSISTIVE DEVICES UTILIZED TO PREVENT FALLS:  Life alert? No  Use of a cane, walker or w/c? Yes  Grab bars in the bathroom? Yes  Shower chair or bench in shower? Yes  Elevated toilet seat or a handicapped toilet? No   TIMED UP AND GO:  Was the test performed? Yes .  Length of time to ambulate 10 feet: 6 sec.   Gait steady and fast with assistive device  Cognitive Function:        Immunizations Immunization History  Administered Date(s) Administered   Influenza,inj,Quad PF,6+ Mos 02/09/2015, 02/17/2016   Pneumococcal Polysaccharide-23 07/06/2015, 02/17/2016   Tdap 05/16/2014    TDAP status: Up to date  Flu Vaccine status: Up to date  Pneumococcal vaccine status: Due, Education has been provided regarding the importance  of this vaccine. Advised may  receive this vaccine at local pharmacy or Health Dept. Aware to provide a copy of the vaccination record if obtained from local pharmacy or Health Dept. Verbalized acceptance and understanding.  Covid-19 vaccine status: Completed vaccines  Qualifies for Shingles Vaccine? Yes   Zostavax completed No   Shingrix Completed?: No.    Education has been provided regarding the importance of this vaccine. Patient has been advised to call insurance company to determine out of pocket expense if they have not yet received this vaccine. Advised may also receive vaccine at local pharmacy or Health Dept. Verbalized acceptance and understanding.  Screening Tests Health Maintenance  Topic Date Due   COVID-19 Vaccine (1) Never done   URINE MICROALBUMIN  Never done   Zoster Vaccines- Shingrix (1 of 2) Never done   COLONOSCOPY (Pts 45-74yrs Insurance coverage will need to be confirmed)  Never done   PNA vac Low Risk Adult (1 of 2 - PCV13) 03/28/2017   INFLUENZA VACCINE  09/10/2020   TETANUS/TDAP  05/15/2024   Hepatitis C Screening  Completed   HPV VACCINES  Aged Out    Health Maintenance  Health Maintenance Due  Topic Date Due   COVID-19 Vaccine (1) Never done   URINE MICROALBUMIN  Never done   Zoster Vaccines- Shingrix (1 of 2) Never done   COLONOSCOPY (Pts 45-85yrs Insurance coverage will need to be confirmed)  Never done   PNA vac Low Risk Adult (1 of 2 - PCV13) 03/28/2017   INFLUENZA VACCINE  09/10/2020    Colorectal cancer screening: Type of screening: FOBT/FIT. Completed  . Repeat every 1 years  Lung Cancer Screening: (Low Dose CT Chest recommended if Age 62-80 years, 30 pack-year currently smoking OR have quit w/in 15years.) does not qualify.   Lung Cancer Screening Referral: na - completed 2022  Additional Screening:  Hepatitis C Screening: does qualify; Completed   Vision Screening: Recommended annual ophthalmology exams for early detection of glaucoma and other disorders of the  eye. Is the patient up to date with their annual eye exam?  No  Who is the provider or what is the name of the office in which the patient attends annual eye exams? na If pt is not established with a provider, would they like to be referred to a provider to establish care? No .   Dental Screening: Recommended annual dental exams for proper oral hygiene  Community Resource Referral / Chronic Care Management: CRR required this visit?  No   CCM required this visit?  No      Plan:     I have personally reviewed and noted the following in the patient's chart:   Medical and social history Use of alcohol, tobacco or illicit drugs  Current medications and supplements including opioid prescriptions. Patient is not currently taking opioid prescriptions. Functional ability and status Nutritional status Physical activity Advanced directives List of other physicians Hospitalizations, surgeries, and ER visits in previous 12 months Vitals Screenings to include cognitive, depression, and falls Referrals and appointments  In addition, I have reviewed and discussed with patient certain preventive protocols, quality metrics, and best practice recommendations. A written personalized care plan for preventive services as well as general preventive health recommendations were provided to patient.     Ivonne Andrew, NP   09/11/2020

## 2020-09-11 NOTE — Telephone Encounter (Addendum)
Met with the patient when he was in the clinic today.  Provided him with information and an application for  Advance Directives.  He is interested in completing a living will and making his wishes known. He does not have anyone that he would like to appoint as a health care power of attorney but would like to have his wishes documented.   Explained to him that he can call this clinic with questions after he reviews the material.  He said that there is a notary at [facility] where he is staying

## 2020-09-11 NOTE — Patient Instructions (Addendum)
[name] , Thank you for taking time to come for your Medicare Wellness Visit. I appreciate your ongoing commitment to your health goals. Please review the following plan we discussed and let me know if I can assist you in the future.   These are the goals we discussed:  Goals   None     This is a list of the screening recommended for you and due dates:  Health Maintenance  Topic Date Due   COVID-19 Vaccine (1) Never done   Urine Protein Check  Never done   Zoster (Shingles) Vaccine (1 of 2) Never done   Colon Cancer Screening  Never done   Pneumonia vaccines (1 of 2 - PCV13) 03/28/2017   Flu Shot  09/10/2020   Tetanus Vaccine  05/15/2024   Hepatitis C Screening: USPSTF Recommendation to screen - Ages 18-79 yo.  Completed   HPV Vaccine  Aged Out   Critical care medicine: Principles of diagnosis and management in the adult (4th ed., pp. [phone]). Saunders."> Miller's anesthesia (8th ed., pp. [phone]). Saunders.">  Advance Directive  Advance directives are legal documents that allow you to make decisions about your health care and medical treatment in case you become unable to communicate for yourself. Advance directives let your wishes be known to family, friends,and health care providers. Discussing and writing advance directives should happen over time rather than all at once. Advance directives can be changed and updated at any time. There are different types of advance directives, such as: Medical power of attorney. Living will. Do not resuscitate (DNR) order or do not attempt resuscitation (DNAR) order. Health care proxy and medical power of attorney A health care proxy is also called a health care agent. This person is appointed to make medical decisions for you when you are unable to make decisions for yourself. Generally, people ask a trusted friend or family member to act as their proxy and represent their preferences. Make sure you have an agreement with your trusted  person to act as your proxy. A proxy may have tomake a medical decision on your behalf if your wishes are not known. A medical power of attorney, also called a durable power of attorney for health care, is a legal document that names your health care proxy. Depending on the laws in your state, the document may need to be: Signed. Notarized. Dated. Copied. Witnessed. Incorporated into your medical record. You may also want to appoint a trusted person to manage your money in the event you are unable to do so. This is called a durable power of attorney for finances. It is a separate legal document from the durable power of attorney for health care. You may choose your health care proxy or someone different toact as your agent in money matters. If you do not appoint a proxy, or there is a concern that the proxy is not acting in your best interest, a court may appoint a guardian to act on yourbehalf. Living will A living will is a set of instructions that state your wishes about medical care when you cannot express them yourself. Health care providers should keep a copy of your living will in your medical record. You may want to give a copy to family members or friends. To alert caregivers in case of an emergency, you can place a card in your wallet to let them know that you have a living will and where they can find it. A living will is used if you become:  Terminally ill. Disabled. Unable to communicate or make decisions. The following decisions should be included in your living will: To use or not to use life support equipment, such as dialysis machines and breathing machines (ventilators). Whether you want a DNR or DNAR order. This tells health care providers not to use cardiopulmonary resuscitation (CPR) if breathing or heartbeat stops. To use or not to use tube feeding. To be given or not to be given food and fluids. Whether you want comfort (palliative) care when the goal becomes comfort rather  than a cure. Whether you want to donate your organs and tissues. A living will does not give instructions for distributing your money andproperty if you should pass away. DNR or DNAR A DNR or DNAR order is a request not to have CPR in the event that your heart stops beating or you stop breathing. If a DNR or DNAR order has not been made and shared, a health care provider will try to help any patient whose heart has stopped or who has stopped breathing. If you plan to have surgery, talk with your health care provider about how your DNR or DNAR order will be followed ifproblems occur. What if I do not have an advance directive? Some states assign family decision makers to act on your behalf if you do not have an advance directive. Each state has its own laws about advance directives. You may want to check with your health care provider, attorney, orstate representative about the laws in your state. Summary Advance directives are legal documents that allow you to make decisions about your health care and medical treatment in case you become unable to communicate for yourself. The process of discussing and writing advance directives should happen over time. You can change and update advance directives at any time. Advance directives may include a medical power of attorney, a living will, and a DNR or DNAR order. This information is not intended to replace advice given to you by your health care provider. Make sure you discuss any questions you have with your healthcare provider. Document Revised: 11/01/2019 Document Reviewed: 11/01/2019 Elsevier Patient Education  2022 [employer].   Fall Prevention in the Home, Adult Falls can cause injuries and can happen to people of all ages. There are many things you can do to make your home safe and to help prevent falls. Ask forhelp when making these changes. What actions can I take to prevent falls? General Instructions Use good lighting in all rooms.  Replace any light bulbs that burn out. Turn on the lights in dark areas. Use night-lights. Keep items that you use often in easy-to-reach places. Lower the shelves around your home if needed. Set up your furniture so you have a clear path. Avoid moving your furniture around. Do not have throw rugs or other things on the floor that can make you trip. Avoid walking on wet floors. If any of your floors are uneven, fix them. Add color or contrast paint or tape to clearly mark and help you see: Grab bars or handrails. First and last steps of staircases. Where the edge of each step is. If you use a stepladder: Make sure that it is fully opened. Do not climb a closed stepladder. Make sure the sides of the stepladder are locked in place. Ask someone to hold the stepladder while you use it. Know where your pets are when moving through your home. What can I do in the bathroom?     Keep the floor  dry. Clean up any water on the floor right away. Remove soap buildup in the tub or shower. Use nonskid mats or decals on the floor of the tub or shower. Attach bath mats securely with double-sided, nonslip rug tape. If you need to sit down in the shower, use a plastic, nonslip stool. Install grab bars by the toilet and in the tub and shower. Do not use towel bars as grab bars. What can I do in the bedroom? Make sure that you have a light by your bed that is easy to reach. Do not use any sheets or blankets for your bed that hang to the floor. Have a firm chair with side arms that you can use for support when you get dressed. What can I do in the kitchen? Clean up any spills right away. If you need to reach something above you, use a step stool with a grab bar. Keep electrical cords out of the way. Do not use floor polish or wax that makes floors slippery. What can I do with my stairs? Do not leave any items on the stairs. Make sure that you have a light switch at the top and the bottom of the  stairs. Make sure that there are handrails on both sides of the stairs. Fix handrails that are broken or loose. Install nonslip stair treads on all your stairs. Avoid having throw rugs at the top or bottom of the stairs. Choose a carpet that does not hide the edge of the steps on the stairs. Check carpeting to make sure that it is firmly attached to the stairs. Fix carpet that is loose or worn. What can I do on the outside of my home? Use bright outdoor lighting. Fix the edges of walkways and driveways and fix any cracks. Remove anything that might make you trip as you walk through a door, such as a raised step or threshold. Trim any bushes or trees on paths to your home. Check to see if handrails are loose or broken and that both sides of all steps have handrails. Install guardrails along the edges of any raised decks and porches. Clear paths of anything that can make you trip, such as tools or rocks. Have leaves, snow, or ice cleared regularly. Use sand or salt on paths during winter. Clean up any spills in your garage right away. This includes grease or oil spills. What other actions can I take? Wear shoes that: Have a low heel. Do not wear high heels. Have rubber bottoms. Feel good on your feet and fit well. Are closed at the toe. Do not wear open-toe sandals. Use tools that help you move around if needed. These include: Canes. Walkers. Scooters. Crutches. Review your medicines with your doctor. Some medicines can make you feel dizzy. This can increase your chance of falling. Ask your doctor what else you can do to help prevent falls. Where to find more information Centers for Disease Control and Prevention, [employer]: FootballExhibition.com.br [employer] on Aging: https://walker.com/ Contact a doctor if: You are afraid of falling at home. You feel weak, drowsy, or dizzy at home. You fall at home. Summary There are many simple things that you can do to make your home safe and to help  prevent falls. Ways to make your home safe include removing things that can make you trip and installing grab bars in the bathroom. Ask for help when making these changes in your home. This information is not intended to replace advice given to you  by your health care provider. Make sure you discuss any questions you have with your healthcare provider. Document Revised: 08/31/2019 Document Reviewed: 08/31/2019 Elsevier Patient Education  2022 Elsevier Inc.  Hearing Loss Hearing loss is a partial or total loss of the ability to hear. This can betemporary or permanent, and it can happen in one or both ears. Medical care is necessary to treat hearing loss properly and to prevent the condition from getting worse. Your hearing may partially or completely come back, depending on what caused your hearing loss and how severe it is. In somecases, hearing loss is permanent. What are the causes? Common causes of hearing loss include: Too much wax in the ear canal. Infection of the ear canal or middle ear. Fluid in the middle ear. Injury to the ear or surrounding area. An object stuck in the ear. A history of prolonged exposure to loud sounds, such as music. Less common causes of hearing loss include: Tumors in the ear. Viral or bacterial infections, such as meningitis. A hole in the eardrum (perforated eardrum). Problems with the hearing nerve that sends signals between the brain and the ear. Certain medicines. What are the signs or symptoms? Symptoms of this condition may include: Difficulty telling the difference between sounds. Difficulty following a conversation when there is background noise. Lack of response to sounds in your environment. This may be most noticeable when you do not respond to startling sounds. Needing to turn up the volume on the television, radio, or other devices. Ringing in the ears. Dizziness. How is this diagnosed? This condition is diagnosed based on: A physical  exam. A hearing test (audiometry). The audiometry test will be performed by a hearing specialist (audiologist). You may also be referred to an ear, nose, and throat (ENT) specialist (otolaryngologist). How is this treated? Treatment for hearing loss may include: Ear wax removal. Medicines to treat or prevent infection (antibiotics). Medicines to reduce inflammation (corticosteroids). Hearing aids for hearing loss related to nerve damage. Follow these instructions at home: If you were prescribed an antibiotic medicine, take it as told by your health care provider. Do not stop taking the antibiotic even if you start to feel better. Take over-the-counter and prescription medicines only as told by your health care provider. Avoid loud noises. Return to your normal activities as told by your health care provider. Ask your health care provider what activities are safe for you. Keep all follow-up visits as told by your health care provider. This is important. Contact a health care provider if: You feel dizzy. You develop new symptoms. You vomit or feel nauseous. You have a fever. Get help right away if: You develop sudden changes in your vision. You have severe ear pain. You have new or increased weakness. You have a severe headache. Summary Hearing loss is a decreased ability to hear sounds around you. It can be temporary or permanent. Treatment will depend on the cause of your hearing loss. It may include ear wax removal, medicines, or a hearing aid. Your hearing may partially or completely come back, depending on what caused your hearing loss and how severe it is. Keep all follow-up visits as told by your health care provider. This is important. This information is not intended to replace advice given to you by your health care provider. Make sure you discuss any questions you have with your healthcare provider. Document Revised: 10/27/2017 Document Reviewed: 10/27/2017 Elsevier Patient  Education  2022 Elsevier Inc.  Steps to Quit Smoking Smoking tobacco  is the leading cause of preventable death. It can affect almost every organ in the body. Smoking puts you and people around you at risk for many serious, long-lasting (chronic) diseases. Quitting smoking can be hard, but it is one of the best things thatyou can do for your health. It is never too late to quit. How do I get ready to quit? When you decide to quit smoking, make a plan to help you succeed. Before you quit: Pick a date to quit. Set a date within the next 2 weeks to give you time to prepare. Write down the reasons why you are quitting. Keep this list in places where you will see it often. Tell your family, friends, and co-workers that you are quitting. Their support is important. Talk with your doctor about the choices that may help you quit. Find out if your health insurance will pay for these treatments. Know the people, places, things, and activities that make you want to smoke (triggers). Avoid them. What first steps can I take to quit smoking? Throw away all cigarettes at home, at work, and in your car. Throw away the things that you use when you smoke, such as ashtrays and lighters. Clean your car. Make sure to empty the ashtray. Clean your home, including curtains and carpets. What can I do to help me quit smoking? Talk with your doctor about taking medicines and seeing a counselor at the same time. You are more likely to succeed when you do both. If you are pregnant or breastfeeding, talk with your doctor about counseling or other ways to quit smoking. Do not take medicine to help you quit smoking unless your doctor tells you to do so. To quit smoking: Quit right away Quit smoking totally, instead of slowly cutting back on how much you smoke over a period of time. Go to counseling. You are more likely to quit if you go to counseling sessions regularly. Take medicine You may take medicines to help you  quit. Some medicines need a prescription, and some you can buy over-the-counter. Some medicines may contain a drug called nicotine to replace the nicotine in cigarettes. Medicines may: Help you to stop having the desire to smoke (cravings). Help to stop the problems that come when you stop smoking (withdrawal symptoms). Your doctor may ask you to use: Nicotine patches, gum, or lozenges. Nicotine inhalers or sprays. Non-nicotine medicine that is taken by mouth. Find resources Find resources and other ways to help you quit smoking and remain smoke-free after you quit. These resources are most helpful when you use them often. They include: Online chats with a Veterinary surgeoncounselor. Phone quitlines. Printed Materials engineerself-help materials. Support groups or group counseling. Text messaging programs. Mobile phone apps. Use apps on your mobile phone or tablet that can help you stick to your quit plan. There are many free apps for mobile phones and tablets as well as websites. Examples include Quit Guide from the [employer] and smokefree.gov  What things can I do to make it easier to quit?  Talk to your family and friends. Ask them to support and encourage you. Call a phone quitline (1-800-QUIT-NOW), reach out to support groups, or work with a Veterinary surgeoncounselor. Ask people who smoke to not smoke around you. Avoid places that make you want to smoke, such as: Bars. Parties. Smoke-break areas at work. Spend time with people who do not smoke. Lower the stress in your life. Stress can make you want to smoke. Try these things to help your  stress: Getting regular exercise. Doing deep-breathing exercises. Doing yoga. Meditating. Doing a body scan. To do this, close your eyes, focus on one area of your body at a time from head to toe. Notice which parts of your body are tense. Try to relax the muscles in those areas. How will I feel when I quit smoking? Day 1 to 3 weeks Within the first 24 hours, you may start to have some problems that  come from quitting tobacco. These problems are very bad 2-3 days after you quit, but they do not often last for more than 2-3 weeks. You may get these symptoms: Mood swings. Feeling restless, nervous, angry, or annoyed. Trouble concentrating. Dizziness. Strong desire for high-sugar foods and nicotine. Weight gain. Trouble pooping (constipation). Feeling like you may vomit (nausea). Coughing or a sore throat. Changes in how the medicines that you take for other issues work in your body. Depression. Trouble sleeping (insomnia). Week 3 and afterward After the first 2-3 weeks of quitting, you may start to notice more positive results, such as: Better sense of smell and taste. Less coughing and sore throat. Slower heart rate. Lower blood pressure. Clearer skin. Better breathing. Fewer sick days. Quitting smoking can be hard. Do not give up if you fail the first time. Some people need to try a few times before they succeed. Do your best to stick to your quit plan, and talk with yourdoctor if you have any questions or concerns. Summary Smoking tobacco is the leading cause of preventable death. Quitting smoking can be hard, but it is one of the best things that you can do for your health. When you decide to quit smoking, make a plan to help you succeed. Quit smoking right away, not slowly over a period of time. When you start quitting, seek help from your doctor, family, or friends. This information is not intended to replace advice given to you by your health care provider. Make sure you discuss any questions you have with your healthcare provider. Document Revised: 10/22/2018 Document Reviewed: 04/17/2018 Elsevier Patient Education  2022 [employer].

## 2020-09-11 NOTE — Telephone Encounter (Signed)
Abigal from [employer] [phone]) called on behalf of [name].  She advised Mr. Bachus needed the following refills be sent over if possible:  - Breo Ellipta - Albuterol  Thanks

## 2020-09-19 NOTE — Telephone Encounter (Signed)
noted 

## 2020-09-19 NOTE — Telephone Encounter (Signed)
Noted, will fu at next appt with pt on 8/16

## 2020-09-20 ENCOUNTER — Ambulatory Visit (INDEPENDENT_AMBULATORY_CARE_PROVIDER_SITE_OTHER): Payer: Medicare Other | Admitting: Pulmonary Disease

## 2020-09-20 ENCOUNTER — Encounter: Payer: Self-pay | Admitting: Pulmonary Disease

## 2020-09-20 ENCOUNTER — Other Ambulatory Visit: Payer: Self-pay

## 2020-09-20 VITALS — BP 130/88 | HR 66 | Temp 97.9°F | Ht 71.0 in | Wt 197.4 lb

## 2020-09-20 DIAGNOSIS — F17210 Nicotine dependence, cigarettes, uncomplicated: Secondary | ICD-10-CM

## 2020-09-20 DIAGNOSIS — J449 Chronic obstructive pulmonary disease, unspecified: Secondary | ICD-10-CM

## 2020-09-20 DIAGNOSIS — J441 Chronic obstructive pulmonary disease with (acute) exacerbation: Secondary | ICD-10-CM | POA: Diagnosis not present

## 2020-09-20 MED ORDER — BREZTRI AEROSPHERE 160-9-4.8 MCG/ACT IN AERO: 2.0000 | INHALATION_SPRAY | Freq: Two times a day (BID) | RESPIRATORY_TRACT | 6 refills | Status: DC

## 2020-09-20 MED ORDER — ALBUTEROL SULFATE HFA 108 (90 BASE) MCG/ACT IN AERS: 1.0000 | INHALATION_SPRAY | Freq: Four times a day (QID) | RESPIRATORY_TRACT | 11 refills | Status: DC | PRN

## 2020-09-20 NOTE — Patient Instructions (Addendum)
Use 21mg  nicotine patch daily to start and can wean down to 14mg  and 7mg  patches later.   You can also use nicotine lozenges and gum for break through cravings.   Start breztri 2 puffs twice daily - rinse mouth out after each use  Use albuterol as needed 1-2 puffs every 4-6 hours as needed.   Do not use incruse or spiriva while taking the Breztri inhaler.   Continue lung cancer screening through your primary care team.

## 2020-09-20 NOTE — Progress Notes (Signed)
Synopsis: Referred in August 2022 for chronic respiratory failure and COPD by Ricky Stabs, NP  Subjective:   PATIENT ID: Johnny Rodriguez GENDER: male DOB: 1951-01-14, MRN: [id number]   HPI  Chief Complaint  Patient presents with   Consult    COPD-    Jiyan Walkowski is a 70 year old male, daily smoker with atrial fibrillation, CAD, hypertension and cardiomyopathy who is referred to pulmonary clinic for respiratory failure and COPD.  Patient reports he has been on supplemental oxygen since last year.  He complains of dyspnea with any sort of exertion.  He uses a wheelchair to get around.  He reports having Spiriva and Incruse and an albuterol rescue inhaler.  He used to have [facility] but is unsure of his medication history.  He continues to smoke 1 pack/day.  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  He is enrolled in lung cancer screening program via his primary care team.  The most recent screening CT chest was done on 07/10/2020 and was lung RADS 2.  He has findings of mild centrilobular and paraseptal emphysematous changes.  Spirometry in 2018 showed severe obstructive defect.  FEV1 1 L.  Past Medical History:  Diagnosis Date   AF (atrial fibrillation) (HCC)    Alcohol abuse    Angina pectoris    CAD (coronary artery disease) 12/15/2004   COPD (chronic obstructive pulmonary disease) (HCC)    Coronary artery disease    Heavy cigarette smoker    HTN (hypertension)    Idiopathic cardiomyopathy (HCC) 07/16/2009   Non compliance with medical treatment    Seizures (HCC)      Family History  Problem Relation Age of Onset   Suicidality Sister    CVA Mother    Stroke Mother    Heart disease Father    Coronary artery disease Father    COPD Father      Social History   Socioeconomic History   Marital status: Single    Spouse name: Not on file   Number of children: Not on file   Years of education: Not on file   Highest education level: Not on file   Occupational History   Occupation: Production manager: UNEMPLOYED  Tobacco Use   Smoking status: Every Day    Packs/day: 1.00    Years: 58.00    Pack years: 58.00    Types: Cigarettes   Smokeless tobacco: Never   Tobacco comments:    pt states he is down to 1ppd 09/20/2020  Vaping Use   Vaping Use: Never used  Substance and Sexual Activity   Alcohol use: Yes    Alcohol/week: 3.0 standard drinks    Types: 3 Cans of beer per week    Comment: EtOH use diorder - liquor   Drug use: No   Sexual activity: Not on file  Other Topics Concern   Not on file  Social History Narrative   ** Merged History Encounter **       Social Determinants of Health   Financial Resource Strain: Low Risk    Difficulty of Paying Living Expenses: Not hard at all  Food Insecurity: No Food Insecurity   Worried About Programme researcher, broadcasting/film/video in the Last Year: Never true   Barista in the Last Year: Never true  Transportation Needs: Unmet Transportation Needs   Lack of Transportation (Medical): Yes   Lack of Transportation (Non-Medical): Yes  Physical Activity: Inactive  Days of Exercise per Week: 0 days   Minutes of Exercise per Session: 0 min  Stress: Stress Concern Present   Feeling of Stress : To some extent  Social Connections: Socially Isolated   Frequency of Communication with Friends and Family: Never   Frequency of Social Gatherings with Friends and Family: Never   Attends Religious Services: Never   Diplomatic Services operational officer: No   Attends Engineer, structural: Never   Marital Status: Never married  Catering manager Violence: Not At Risk   Fear of Current or Ex-Partner: No   Emotionally Abused: No   Physically Abused: No   Sexually Abused: No     No Known Allergies   Outpatient Medications Prior to Visit  Medication Sig Dispense Refill   dabigatran (PRADAXA) 150 MG CAPS capsule Take 1 capsule (150 mg total) by mouth 2 (two) times daily. 60  capsule 12   diltiazem (CARDIZEM CD) 240 MG 24 hr capsule Take 1 capsule (240 mg total) by mouth daily. 90 capsule 3   divalproex (DEPAKOTE SPRINKLE) 125 MG capsule Take 2 capsules (250 mg total) by mouth 2 (two) times daily. 360 capsule 0   furosemide (LASIX) 20 MG tablet Take 1 tablet (20 mg total) by mouth daily. 90 tablet 0   Incontinence Supply Disposable (PROCARE ADULT BRIEFS MEDIUM) MISC 25 each by Does not apply route as needed. 25 each 6   metoprolol succinate (TOPROL-XL) 50 MG 24 hr tablet Take 1 tablet (50 mg total) by mouth daily. Take with or immediately following a meal. 90 tablet 3   OXYGEN Inhale 3 L into the lungs continuous.      polyethylene glycol (MIRALAX / GLYCOLAX) 17 g packet Take 17 g by mouth daily. (Patient taking differently: Take 17 g by mouth daily as needed for mild constipation.) 30 each 0   senna (SENOKOT) 8.6 MG TABS tablet Take 1-2 tablets (8.6-17.2 mg total) by mouth daily. (Patient taking differently: Take 1-2 tablets by mouth daily as needed for mild constipation.) 30 tablet 0   simvastatin (ZOCOR) 20 MG tablet Take 1 tablet (20 mg total) by mouth daily. 90 tablet 0   spironolactone (ALDACTONE) 25 MG tablet Take 1 tablet (25 mg total) by mouth daily. 90 tablet 0   traZODone (DESYREL) 50 MG tablet Take 1 tablet (50 mg total) by mouth at bedtime. 90 tablet 0   albuterol (PROAIR HFA) 108 (90 Base) MCG/ACT inhaler Inhale 1-2 puffs into the lungs every 6 (six) hours as needed for wheezing or shortness of breath. 18 g 1   fluticasone furoate-vilanterol (BREO ELLIPTA) 100-25 MCG/INH AEPB Inhale 1 puff into the lungs daily. 90 each 0   mometasone-formoterol (DULERA) 200-5 MCG/ACT AERO Inhale 2 puffs into the lungs 2 (two) times daily. Medicaid [id number] O 13 g 1   tiotropium (SPIRIVA) 18 MCG inhalation capsule Place 1 capsule (18 mcg total) into inhaler and inhale daily. 90 capsule 3   No facility-administered medications prior to visit.    Review of Systems   Constitutional:  Negative for chills, fever, malaise/fatigue and weight loss.  HENT:  Negative for congestion, sinus pain and sore throat.   Eyes: Negative.   Respiratory:  Positive for cough, sputum production, shortness of breath and wheezing. Negative for hemoptysis.   Cardiovascular:  Negative for chest pain, palpitations, orthopnea, claudication and leg swelling.  Gastrointestinal:  Negative for abdominal pain, heartburn, nausea and vomiting.  Genitourinary: Negative.   Musculoskeletal:  Negative for joint pain and  myalgias.  Skin:  Negative for rash.  Neurological:  Negative for weakness.  Endo/Heme/Allergies: Negative.   Psychiatric/Behavioral: Negative.     Objective:   Vitals:   09/20/20 1347  BP: 130/88  Pulse: 66  Temp: 97.9 F (36.6 C)  TempSrc: Oral  SpO2: 93%  Weight: 197 lb 6.4 oz (89.5 kg)  Height: 5\' 11"  (1.803 m)    Physical Exam Constitutional:      General: He is not in acute distress. HENT:     Head: Normocephalic and atraumatic.  Eyes:     Extraocular Movements: Extraocular movements intact.     Conjunctiva/sclera: Conjunctivae normal.     Pupils: Pupils are equal, round, and reactive to light.  Cardiovascular:     Rate and Rhythm: Normal rate and regular rhythm.     Pulses: Normal pulses.     Heart sounds: Normal heart sounds. No murmur heard. Pulmonary:     Breath sounds: Decreased breath sounds, wheezing (mild scattered left upper lung field posteriorly) and rhonchi (left anterior) present. No rales.  Abdominal:     General: Bowel sounds are normal.     Palpations: Abdomen is soft.  Musculoskeletal:     Right lower leg: No edema.     Left lower leg: No edema.  Lymphadenopathy:     Cervical: No cervical adenopathy.  Skin:    General: Skin is warm and dry.  Neurological:     General: No focal deficit present.     Mental Status: He is alert.  Psychiatric:        Mood and Affect: Mood normal.        Behavior: Behavior normal.         Thought Content: Thought content normal.        Judgment: Judgment normal.    CBC    Component Value Date/Time   WBC 7.4 06/20/2020 1115   WBC 11.3 (H) 06/07/2020 0014   RBC 4.90 06/20/2020 1115   RBC 4.10 (L) 06/07/2020 0014   HGB 14.6 06/20/2020 1115   HCT 45.1 06/20/2020 1115   PLT 244 06/20/2020 1115   MCV 92 06/20/2020 1115   MCV 93 02/05/2014 2217   MCH 29.8 06/20/2020 1115   MCH 29.8 06/07/2020 0014   MCHC 32.4 06/20/2020 1115   MCHC 31.7 06/07/2020 0014   RDW 12.3 06/20/2020 1115   RDW 13.1 02/05/2014 2217   LYMPHSABS 0.5 (L) 06/05/2020 1934   MONOABS 0.2 06/05/2020 1934   EOSABS 0.1 06/05/2020 1934   BASOSABS 0.0 06/05/2020 1934   BMP Latest Ref Rng & Units 06/20/2020 06/07/2020 06/06/2020  Glucose 65 - 99 mg/dL 83 650(P) 546(F)  BUN 8 - 27 mg/dL 18 15 8   Creatinine 0.76 - 1.27 mg/dL 6.81 2.75 1.70  BUN/Creat Ratio 10 - 24 17 - -  Sodium 134 - 144 mmol/L 143 136 139  Potassium 3.5 - 5.2 mmol/L 4.6 3.9 4.6  Chloride 96 - 106 mmol/L 98 94(L) 94(L)  CO2 20 - 29 mmol/L 28 32 36(H)  Calcium 8.6 - 10.2 mg/dL 9.6 0.1(V) 9.6   Chest imaging: CT Chest Lung Cancer Screening 07/10/20 Mild diffuse bronchial wall thickening with mild centrilobular and paraseptal emphysema. Lung RADS 2S. Atherosclerosis present.   PFT: No flowsheet data found. Spirometry in 2018 shows severe obstructive defect with an FEV1 of 1 L / 29% predicted   Echo 06/16/19: LVEF 45 to 50%. Global hypokinesis. Mild LVH. RV systolic function is normal. Mildly elevated PA pressure 36.26mmHg. RA mildly dilated.  Assessment & Plan:   Chronic obstructive pulmonary disease, unspecified COPD type (HCC) - Plan: Budeson-Glycopyrrol-Formoterol (BREZTRI AEROSPHERE) 160-9-4.8 MCG/ACT AERO  Cigarette smoker  COPD with acute exacerbation (HCC) - Plan: albuterol (PROAIR HFA) 108 (90 Base) MCG/ACT inhaler  Discussion: Che Rachal is a 69 year old male, daily smoker with atrial fibrillation, CAD, hypertension and  cardiomyopathy who is referred to pulmonary clinic for respiratory failure and COPD.  He is very symptomatic and likely has class B or D COPD and we will continue to monitor him for exacerbations for further risk stratification.  We will start him on Breztri inhaler 2 puffs twice daily.  He can continue to use albuterol as needed.  He reported he had increase in Spiriva at home and I told him to hold these inhalers while on Breztri.  He is to continue on supplemental oxygen and we will consider referral to pulmonary rehab in the future.  We discussed the importance of smoking cessation.  This is likely the most important step he can take for his health at this time.  I have recommended nicotine replacement therapy with nicotine patches daily with as needed nicotine lozenges and gum.  Follow-up in 6 months.  Melody Comas, MD [facility] Office: [phone]      Current Outpatient Medications:    Budeson-Glycopyrrol-Formoterol (BREZTRI AEROSPHERE) 160-9-4.8 MCG/ACT AERO, Inhale 2 puffs into the lungs in the morning and at bedtime., Disp: 10.7 g, Rfl: 6   dabigatran (PRADAXA) 150 MG CAPS capsule, Take 1 capsule (150 mg total) by mouth 2 (two) times daily., Disp: 60 capsule, Rfl: 12   diltiazem (CARDIZEM CD) 240 MG 24 hr capsule, Take 1 capsule (240 mg total) by mouth daily., Disp: 90 capsule, Rfl: 3   divalproex (DEPAKOTE SPRINKLE) 125 MG capsule, Take 2 capsules (250 mg total) by mouth 2 (two) times daily., Disp: 360 capsule, Rfl: 0   furosemide (LASIX) 20 MG tablet, Take 1 tablet (20 mg total) by mouth daily., Disp: 90 tablet, Rfl: 0   Incontinence Supply Disposable (PROCARE ADULT BRIEFS MEDIUM) MISC, 25 each by Does not apply route as needed., Disp: 25 each, Rfl: 6   metoprolol succinate (TOPROL-XL) 50 MG 24 hr tablet, Take 1 tablet (50 mg total) by mouth daily. Take with or immediately following a meal., Disp: 90 tablet, Rfl: 3   OXYGEN, Inhale 3 L into the lungs  continuous. , Disp: , Rfl:    polyethylene glycol (MIRALAX / GLYCOLAX) 17 g packet, Take 17 g by mouth daily. (Patient taking differently: Take 17 g by mouth daily as needed for mild constipation.), Disp: 30 each, Rfl: 0   senna (SENOKOT) 8.6 MG TABS tablet, Take 1-2 tablets (8.6-17.2 mg total) by mouth daily. (Patient taking differently: Take 1-2 tablets by mouth daily as needed for mild constipation.), Disp: 30 tablet, Rfl: 0   simvastatin (ZOCOR) 20 MG tablet, Take 1 tablet (20 mg total) by mouth daily., Disp: 90 tablet, Rfl: 0   spironolactone (ALDACTONE) 25 MG tablet, Take 1 tablet (25 mg total) by mouth daily., Disp: 90 tablet, Rfl: 0   traZODone (DESYREL) 50 MG tablet, Take 1 tablet (50 mg total) by mouth at bedtime., Disp: 90 tablet, Rfl: 0   albuterol (PROAIR HFA) 108 (90 Base) MCG/ACT inhaler, Inhale 1-2 puffs into the lungs every 6 (six) hours as needed for wheezing or shortness of breath., Disp: 18 g, Rfl: 11

## 2020-09-25 ENCOUNTER — Other Ambulatory Visit: Payer: Self-pay

## 2020-09-25 ENCOUNTER — Ambulatory Visit (INDEPENDENT_AMBULATORY_CARE_PROVIDER_SITE_OTHER): Payer: Medicare Other | Admitting: Clinical

## 2020-09-25 DIAGNOSIS — F1020 Alcohol dependence, uncomplicated: Secondary | ICD-10-CM

## 2020-09-25 DIAGNOSIS — F659 Paraphilia, unspecified: Secondary | ICD-10-CM

## 2020-09-25 DIAGNOSIS — R442 Other hallucinations: Secondary | ICD-10-CM

## 2020-09-25 DIAGNOSIS — R4681 Obsessive-compulsive behavior: Secondary | ICD-10-CM
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
## 2020-10-01 ENCOUNTER — Other Ambulatory Visit: Payer: Self-pay | Admitting: Family

## 2020-10-01 DIAGNOSIS — G4700 Insomnia, unspecified: Secondary | ICD-10-CM

## 2020-10-01 DIAGNOSIS — I5022 Chronic systolic (congestive) heart failure: Secondary | ICD-10-CM

## 2020-10-03 ENCOUNTER — Other Ambulatory Visit: Payer: Self-pay | Admitting: Family

## 2020-10-03 DIAGNOSIS — J441 Chronic obstructive pulmonary disease with (acute) exacerbation: Secondary | ICD-10-CM

## 2020-10-09 ENCOUNTER — Other Ambulatory Visit: Payer: Self-pay

## 2020-10-09 ENCOUNTER — Ambulatory Visit (INDEPENDENT_AMBULATORY_CARE_PROVIDER_SITE_OTHER): Payer: Medicare Other | Admitting: Internal Medicine

## 2020-10-09 DIAGNOSIS — I4819 Other persistent atrial fibrillation: Secondary | ICD-10-CM
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
## 2020-10-09 NOTE — Patient Instructions (Signed)
Medication Instructions:  Your physician recommends that you continue on your current medications as directed. Please refer to the Current Medication list given to you today.  *If you need a refill on your cardiac medications before your next appointment, please call your pharmacy*   Lab Work: None If you have labs (blood work) drawn today and your tests are completely normal, you will receive your results only by: MyChart Message (if you have MyChart) OR A paper copy in the mail If you have any lab test that is abnormal or we need to change your treatment, we will call you to review the results.   Testing/Procedures: None   Follow-Up: At [facility] [place] [facility], you and your health needs are our priority.  As part of our continuing mission to provide you with exceptional heart care, we have created designated Provider Care Teams.  These Care Teams include your primary Cardiologist (physician) and Advanced Practice Providers (APPs -  Physician Assistants and Nurse Practitioners) who all work together to provide you with the care you need, when you need it.  We recommend signing up for the patient portal called "MyChart".  Sign up information is provided on this After Visit Summary.  MyChart is used to connect with patients for Virtual Visits (Telemedicine).  Patients are able to view lab/test results, encounter notes, upcoming appointments, etc.  Non-urgent messages can be sent to your provider as well.   To learn more about what you can do with MyChart, go to [email or web address].    Your next appointment:   1 year(s)  The format for your next appointment:   In Person  Provider:   You may see Lewayne Bunting, MD or one of the following Advanced Practice Providers on your designated Care Team:   Francis Dowse, [place] Casimiro Needle "Mardelle Matte" Lanna Poche, [place]   Other Instructions

## 2020-10-10 NOTE — Addendum Note (Signed)
Addended by: Solon Augusta on: 10/10/2020 04:04 PM   Modules accepted: Orders

## 2020-10-23 ENCOUNTER — Other Ambulatory Visit: Payer: Self-pay

## 2020-10-23 ENCOUNTER — Ambulatory Visit (INDEPENDENT_AMBULATORY_CARE_PROVIDER_SITE_OTHER): Payer: Medicare Other | Admitting: Clinical

## 2020-10-23 DIAGNOSIS — R4681 Obsessive-compulsive behavior: Secondary | ICD-10-CM

## 2020-10-23 DIAGNOSIS — F321 Major depressive disorder, single episode, moderate: Secondary | ICD-10-CM

## 2020-10-23 DIAGNOSIS — F1020 Alcohol dependence, uncomplicated: Secondary | ICD-10-CM
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
## 2020-10-26 NOTE — Progress Notes (Signed)
Patient ID: Johnny Rodriguez, male    DOB: [birth year]  MRN: [id number]  CC: Left Shoulder Pain  Subjective: Johnny Rodriguez is a 70 y.o. male who presents for left shoulder pain.   His concerns today include:  Left shoulder pain ongoing for years. Radiates to the left side of neck. Reports he was told in the past from previous doctors that he has arthritis of the shoulder. Was also offered surgery at one time and thinks ready for surgery now.     Patient Active Problem List   Diagnosis Date Noted   Hypocalcemia    Hypokalemia    Atrial fibrillation with RVR (HCC) 06/05/2020   COPD with acute exacerbation (HCC) 03/11/2019   AKI (acute kidney injury) (HCC) 03/10/2019   Bowel obstruction (HCC) 03/10/2019   Chronic respiratory failure with hypoxia (HCC) 10/15/2016   Cigarette smoker 09/09/2016   Long term current use of anticoagulant therapy 06/24/2016   History of syncope 06/24/2016   COPD exacerbation (HCC) 02/08/2015   Essential hypertension 02/08/2015   Syncope 12/10/2011   History of noncompliance with medical treatment 12/01/2011   Chronic systolic heart failure (HCC) 04/10/2011   Idiopathic cardiomyopathy (HCC) 07/16/2009   Paroxysmal atrial fibrillation (HCC) 07/16/2009   COPD PFT's pending  07/16/2009   CAD (coronary artery disease) 12/15/2004     Current Outpatient Medications on File Prior to Visit  Medication Sig Dispense Refill   albuterol (VENTOLIN HFA) 108 (90 Base) MCG/ACT inhaler Inhale 1-2 puffs into the lungs every 6 (six) hours as needed for wheezing or shortness of breath. 18 g 1   Budeson-Glycopyrrol-Formoterol (BREZTRI AEROSPHERE) 160-9-4.8 MCG/ACT AERO Inhale 2 puffs into the lungs in the morning and at bedtime. 10.7 g 6   dabigatran (PRADAXA) 150 MG CAPS capsule Take 1 capsule (150 mg total) by mouth 2 (two) times daily. 60 capsule 12   diltiazem (CARDIZEM CD) 240 MG 24 hr capsule Take 1 capsule (240 mg total) by mouth daily. 90 capsule 3   divalproex  (DEPAKOTE SPRINKLE) 125 MG capsule Take 2 capsules (250 mg total) by mouth 2 (two) times daily. 360 capsule 0   furosemide (LASIX) 20 MG tablet Take 1 tablet (20 mg total) by mouth daily. 90 tablet 0   metoprolol succinate (TOPROL-XL) 50 MG 24 hr tablet Take 1 tablet (50 mg total) by mouth daily. Take with or immediately following a meal. 90 tablet 3   OXYGEN Inhale 3 L into the lungs continuous.      polyethylene glycol (MIRALAX / GLYCOLAX) 17 g packet Take 17 g by mouth daily. (Patient taking differently: Take 17 g by mouth daily as needed for mild constipation.) 30 each 0   senna (SENOKOT) 8.6 MG TABS tablet Take 1-2 tablets (8.6-17.2 mg total) by mouth daily. (Patient taking differently: Take 1-2 tablets by mouth daily as needed for mild constipation.) 30 tablet 0   simvastatin (ZOCOR) 20 MG tablet Take 1 tablet (20 mg total) by mouth daily. 90 tablet 0   spironolactone (ALDACTONE) 25 MG tablet TAKE 1 TABLET(25 MG) BY MOUTH DAILY 90 tablet 0   traZODone (DESYREL) 50 MG tablet TAKE 1 TABLET(50 MG) BY MOUTH AT BEDTIME 90 tablet 0   No current facility-administered medications on file prior to visit.    No Known Allergies  Social History   Socioeconomic History   Marital status: Single    Spouse name: Not on file   Number of children: Not on file   Years of education: Not on  file   Highest education level: Not on file  Occupational History   Occupation: Production manager: UNEMPLOYED  Tobacco Use   Smoking status: Every Day    Packs/day: 1.00    Years: 58.00    Pack years: 58.00    Types: Cigarettes   Smokeless tobacco: Never   Tobacco comments:    pt states he is down to 1ppd 09/20/2020  Vaping Use   Vaping Use: Never used  Substance and Sexual Activity   Alcohol use: Yes    Alcohol/week: 3.0 standard drinks    Types: 3 Cans of beer per week    Comment: EtOH use diorder - liquor   Drug use: No   Sexual activity: Not on file  Other Topics Concern   Not on file   Social History Narrative   ** Merged History Encounter **       Social Determinants of Health   Financial Resource Strain: Low Risk    Difficulty of Paying Living Expenses: Not hard at all  Food Insecurity: No Food Insecurity   Worried About Programme researcher, broadcasting/film/video in the Last Year: Never true   Barista in the Last Year: Never true  Transportation Needs: Unmet Transportation Needs   Lack of Transportation (Medical): Yes   Lack of Transportation (Non-Medical): Yes  Physical Activity: Inactive   Days of Exercise per Week: 0 days   Minutes of Exercise per Session: 0 min  Stress: Stress Concern Present   Feeling of Stress : To some extent  Social Connections: Socially Isolated   Frequency of Communication with Friends and Family: Never   Frequency of Social Gatherings with Friends and Family: Never   Attends Religious Services: Never   Database administrator or Organizations: No   Attends Engineer, structural: Never   Marital Status: Never married  Catering manager Violence: Not At Risk   Fear of Current or Ex-Partner: No   Emotionally Abused: No   Physically Abused: No   Sexually Abused: No    Family History  Problem Relation Age of Onset   Suicidality Sister    CVA Mother    Stroke Mother    Heart disease Father    Coronary artery disease Father    COPD Father     Past Surgical History:  Procedure Laterality Date   CARDIAC CATHETERIZATION  2006   CARDIAC CATHETERIZATION  12/2004   Left    CARDIOVERSION  01/21/2011   Procedure: CARDIOVERSION;  Surgeon: Darden Palmer., MD;  Location: [facility] OR;  Service: Cardiovascular;  Laterality: N/A;   DOPPLER ECHOCARDIOGRAPHY  02/2011   ELECTROPHYSIOLOGY STUDY N/A 12/16/2011   Procedure: ELECTROPHYSIOLOGY STUDY;  Surgeon: Marinus Maw, MD;  Location: [facility] CATH LAB;  Service: Cardiovascular;  Laterality: N/A;   EP IMPLANTABLE DEVICE N/A 08/24/2015   Procedure: Loop Recorder Removal;  Surgeon: Marinus Maw, MD;   Location: [facility] INVASIVE CV LAB;  Service: Cardiovascular;  Laterality: N/A;   fx collar bone     NO PAST SURGERIES      ROS: Review of Systems Negative except as stated above  PHYSICAL EXAM: BP (!) 105/56 (BP Location: Right Arm, Patient Position: Sitting, Cuff Size: Normal)   Pulse 84   Temp 97.8 F (36.6 C)   Resp 18   Ht 5' 10.98" (1.803 m)   Wt 190 lb (86.2 kg)   SpO2 (!) 85%   BMI 26.51 kg/m   Physical Exam HENT:  Head: Normocephalic and atraumatic.  Eyes:     Extraocular Movements: Extraocular movements intact.     Conjunctiva/sclera: Conjunctivae normal.     Pupils: Pupils are equal, round, and reactive to light.  Cardiovascular:     Rate and Rhythm: Normal rate and regular rhythm.     Pulses: Normal pulses.     Heart sounds: Normal heart sounds.  Pulmonary:     Effort: Pulmonary effort is normal.     Breath sounds: Normal breath sounds.     Comments:  Portable oxygen machine present at 3 liters. Musculoskeletal:     Left shoulder: Tenderness present.     Cervical back: Normal range of motion and neck supple.     Comments: Tenderness to palpation left shoulder.   Neurological:     General: No focal deficit present.     Mental Status: He is alert and oriented to person, place, and time.  Psychiatric:        Mood and Affect: Mood normal.        Behavior: Behavior normal.    ASSESSMENT AND PLAN: 1. Chronic left shoulder pain: - Diagnostic x-ray left shoulder for further evaluation.  - Referral to Orthopedic Surgery for further evaluation and management.  - Follow-up with primary provider as scheduled.  - DG Shoulder Left; Future - Ambulatory referral to Orthopedic Surgery   Patient was given the opportunity to ask questions.  Patient verbalized understanding of the plan and was able to repeat key elements of the plan. Patient was given clear instructions to go to Emergency Department or return to medical center if symptoms don't improve, worsen, or new  problems develop.The patient verbalized understanding.   Orders Placed This Encounter  Procedures   DG Shoulder Left   Ambulatory referral to Orthopedic Surgery     Requested Prescriptions    No prescriptions requested or ordered in this encounter    Follow-up with primary provider as scheduled.  Rema Fendt, NP

## 2020-10-30 ENCOUNTER — Ambulatory Visit (INDEPENDENT_AMBULATORY_CARE_PROVIDER_SITE_OTHER): Payer: Medicare Other | Admitting: Family

## 2020-10-30 ENCOUNTER — Other Ambulatory Visit: Payer: Self-pay

## 2020-10-30 ENCOUNTER — Encounter: Payer: Self-pay | Admitting: Family

## 2020-10-30 ENCOUNTER — Ambulatory Visit (INDEPENDENT_AMBULATORY_CARE_PROVIDER_SITE_OTHER): Payer: Medicare Other

## 2020-10-30 VITALS — BP 105/56 | HR 84 | Temp 97.8°F | Resp 18 | Ht 70.98 in | Wt 190.0 lb

## 2020-10-30 DIAGNOSIS — M25512 Pain in left shoulder: Secondary | ICD-10-CM | POA: Diagnosis not present

## 2020-10-30 DIAGNOSIS — G8929 Other chronic pain: Secondary | ICD-10-CM | POA: Diagnosis not present

## 2020-10-30 NOTE — Progress Notes (Signed)
Pt presents for left shoulder pain and left shoulder area

## 2020-11-01 NOTE — Progress Notes (Signed)
Please call patient with update.   Arthritis changes left shoulder. Will continue with plan for referral to Orthopedics.

## 2020-11-02 ENCOUNTER — Telehealth: Payer: Self-pay | Admitting: Family

## 2020-11-02 NOTE — Telephone Encounter (Signed)
Patient called the office asking for his xray results from 9/20/2.2

## 2020-11-06 ENCOUNTER — Other Ambulatory Visit: Payer: Self-pay | Admitting: Family

## 2020-11-06 DIAGNOSIS — I5022 Chronic systolic (congestive) heart failure: Secondary | ICD-10-CM

## 2020-11-06 DIAGNOSIS — F411 Generalized anxiety disorder: Secondary | ICD-10-CM

## 2020-11-06 DIAGNOSIS — J441 Chronic obstructive pulmonary disease with (acute) exacerbation: Secondary | ICD-10-CM

## 2020-11-06 DIAGNOSIS — G4700 Insomnia, unspecified: Secondary | ICD-10-CM

## 2020-11-08 ENCOUNTER — Telehealth: Payer: Self-pay | Admitting: Family

## 2020-11-08 NOTE — Telephone Encounter (Signed)
Pt last seen 10/30/20 Needs refill on furosemide (LASIX) 20 MG tablet [[id number]]   traZODone (DESYREL) 50 MG tablet [[id number]]   divalproex (DEPAKOTE SPRINKLE) 125 MG capsule [[id number]]   diltiazem (CARDIZEM CD) 240 MG 24 hr capsule [[id number]]   Send to [facility] [place], [place] - [address]  [address], [address] [place] [address]  Phone:  [phone]  Fax:  [phone]

## 2020-11-15 ENCOUNTER — Other Ambulatory Visit: Payer: Self-pay | Admitting: Family

## 2020-11-15 DIAGNOSIS — F411 Generalized anxiety disorder: Secondary | ICD-10-CM

## 2020-11-15 MED ORDER — DIVALPROEX SODIUM 125 MG PO CSDR: 250.0000 mg | DELAYED_RELEASE_CAPSULE | Freq: Two times a day (BID) | ORAL | 0 refills | Status: DC

## 2020-11-15 NOTE — Telephone Encounter (Signed)
Please call patient with update.   - Trazodone (Desyrel) refilled on 10/08/2020 for a 90 day supply. Patient not due for refill until 01/08/2021. Please confirm with patient the need for early refill.   - Furosemide (Lasix) managed by Cardiology. Last appointment 10/09/2020. Patient encouraged to call Cardiology for refill.  - Diltiazem (Cardizem) refilled on 06/07/2020 for a 1 year supply. However, if patient needs refills encouraged to call Cardiology for refill.  - Breo Ellipta inhaler managed by Pulmonology. Last appointment 09/20/2020. Patient encouraged to call Pulmonology for refill.  - Divalproex (Depakote) refilled for courtesy 30 day refill. Please let patient know that primary care will be unable to manage refills long term. Referral placed to Psychiatry for further evaluation and management. Their office should call patient within 2 weeks with appointment details.

## 2020-11-15 NOTE — Telephone Encounter (Signed)
Please call patient with update.   - Trazodone (Desyrel) refilled on 10/08/2020 for a 90 day supply. Patient not due for refill until 01/08/2021. Please confirm with patient the need for early refill.   - Furosemide (Lasix) managed by Cardiology. Last appointment 10/09/2020. Patient encouraged to call Cardiology for refill.  - Diltiazem (Cardizem) refilled on 06/07/2020 for a 1 year supply. However, if patient needs refills encouraged to call Cardiology for refill.  - Divalproex (Depakote) refilled for courtesy 30 day refill. Please let patient know that primary care will be unable to manage refills long term. Referral placed to Psychiatry for further evaluation and management. Their office should call patient within 2 weeks with appointment details.

## 2020-11-16 NOTE — Telephone Encounter (Signed)
Patient has been called and is aware of NP note

## 2020-11-20 ENCOUNTER — Ambulatory Visit (INDEPENDENT_AMBULATORY_CARE_PROVIDER_SITE_OTHER): Payer: Medicare Other | Admitting: Clinical

## 2020-11-20 ENCOUNTER — Other Ambulatory Visit: Payer: Self-pay

## 2020-11-20 DIAGNOSIS — F1020 Alcohol dependence, uncomplicated: Secondary | ICD-10-CM

## 2020-11-20 DIAGNOSIS — F331 Major depressive disorder, recurrent, moderate: Secondary | ICD-10-CM

## 2020-11-20 DIAGNOSIS — R4681 Obsessive-compulsive behavior: Secondary | ICD-10-CM

## 2020-11-21 NOTE — Telephone Encounter (Signed)
Patient has been notified of NP advice
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
## 2020-12-09 ENCOUNTER — Other Ambulatory Visit: Payer: Self-pay | Admitting: Family

## 2020-12-09 DIAGNOSIS — J441 Chronic obstructive pulmonary disease with (acute) exacerbation: Secondary | ICD-10-CM

## 2020-12-09 DIAGNOSIS — G4700 Insomnia, unspecified: Secondary | ICD-10-CM

## 2020-12-10 ENCOUNTER — Other Ambulatory Visit: Payer: Self-pay | Admitting: Family

## 2020-12-10 DIAGNOSIS — I5022 Chronic systolic (congestive) heart failure: Secondary | ICD-10-CM

## 2020-12-10 DIAGNOSIS — F411 Generalized anxiety disorder: Secondary | ICD-10-CM

## 2020-12-18 ENCOUNTER — Other Ambulatory Visit: Payer: Self-pay

## 2020-12-18 ENCOUNTER — Ambulatory Visit (INDEPENDENT_AMBULATORY_CARE_PROVIDER_SITE_OTHER): Payer: Medicare Other | Admitting: Clinical

## 2020-12-18 DIAGNOSIS — F1020 Alcohol dependence, uncomplicated: Secondary | ICD-10-CM

## 2020-12-18 DIAGNOSIS — F331 Major depressive disorder, recurrent, moderate: Secondary | ICD-10-CM
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
## 2021-01-15 ENCOUNTER — Telehealth: Payer: Self-pay | Admitting: Clinical

## 2021-01-15 NOTE — Telephone Encounter (Signed)
I called to speak with pt who stated that he is having problems with his asthma and that he is out of his inhalers due to not being able to pay for them. He mentioned that he still has his oxygen. He mentioned that he is out of all of his medications. He also mentioned that he has an ACTT team but is not sure of the agency.

## 2021-01-16 NOTE — Telephone Encounter (Signed)
Patient's COPD and asthma managed by Melody Comas, MD at Pulmonology please request refills from the same.

## 2021-01-22 ENCOUNTER — Ambulatory Visit (HOSPITAL_COMMUNITY): Payer: Self-pay | Admitting: Psychiatry

## 2021-01-25 ENCOUNTER — Telehealth: Payer: Self-pay | Admitting: Family

## 2021-01-25 ENCOUNTER — Other Ambulatory Visit: Payer: Self-pay

## 2021-01-25 ENCOUNTER — Ambulatory Visit (INDEPENDENT_AMBULATORY_CARE_PROVIDER_SITE_OTHER): Payer: Medicare Other | Admitting: Family

## 2021-01-25 DIAGNOSIS — N39498 Other specified urinary incontinence: Secondary | ICD-10-CM

## 2021-01-25 NOTE — Progress Notes (Signed)
Virtual Visit via Telephone Note  I connected with Johnny Rodriguez, on 01/25/2021 at 8:19 AM by telephone due to the COVID-19 pandemic and verified that I am speaking with the correct person using two identifiers.  Due to current restrictions/limitations of in-office visits due to the COVID-19 pandemic, this scheduled clinical appointment was converted to a telehealth visit.   Consent: I discussed the limitations, risks, security and privacy concerns of performing an evaluation and management service by telephone and the availability of in person appointments. I also discussed with the patient that there may be a patient responsible charge related to this service. The patient expressed understanding and agreed to proceed.   Location of Patient: Home  Location of Provider: [facility]   Persons participating in Telemedicine visit: Taelor Moncada Ricky Stabs, NP Margorie John, CMA   History of Present Illness: Johnny Rodriguez. Reh is a 70 year-old male who presents for incontinence supplies.   INCONTINENCE:  During the last three months, have you leaked urine (even if a small amount)?  Yes[x]   No[]   2. During the last three months, did you leak urine (check all that apply): [] When you were performing some physical activity, such as coughing, sneezing, lifting, or exercise?  [] When you had the urge or the feeling that you needed to empty your bladder, but you could not get to the toilet fast enough? [x] Without physical activity and without a sense of urgency?   3. During the last three months, did you leak urine most often (check only one):  [] When you were performing some physical activity, such as coughing, sneezing, lifting, or exercise?  [] When you had the urge or the feeling that you needed to empty your bladder, but you could not get to the toilet fast enough? [x] Without physical activity and without a sense of urgency?  [] About equally as  often with physical activity as with a sense of urgency?   Comments: Reports urinary incontinence began at least 12 months ago secondary to CHF, kidney issues, and prescribed medications. Denies bowel incontinence. Reports sometimes unable to make it to the toilet. Denies urinary urge. Adult briefs are helping, requesting medium sized extra absorbent. Prefers to have supplies delivered to his actual room (#145) where he is currently lodging at a local motel. However, reports he is unsure how much longer he will be staying there. States he paid for at least another 2 weeks but unsure if his request is confirmed. He is worried that his supplies will be delivered once he has moved. Reports he will update primary care when he moves.     Past Medical History:  Diagnosis Date   AF (atrial fibrillation) (HCC)    Alcohol abuse    Angina pectoris    CAD (coronary artery disease) 12/15/2004   COPD (chronic obstructive pulmonary disease) (HCC)    Coronary artery disease    Heavy cigarette smoker    HTN (hypertension)    Idiopathic cardiomyopathy (HCC) 07/16/2009   Non compliance with medical treatment    Seizures (HCC)    No Known Allergies  Current Outpatient Medications on File Prior to Visit  Medication Sig Dispense Refill   Budeson-Glycopyrrol-Formoterol (BREZTRI AEROSPHERE) 160-9-4.8 MCG/ACT AERO Inhale 2 puffs into the lungs in the morning and at bedtime. 10.7 g 6   dabigatran (PRADAXA) 150 MG CAPS capsule Take 1 capsule (150 mg total) by mouth 2 (two) times daily. 60 capsule 12   diltiazem (CARDIZEM CD) 240 MG 24  hr capsule Take 1 capsule (240 mg total) by mouth daily. 90 capsule 3   divalproex (DEPAKOTE SPRINKLE) 125 MG capsule TAKE 2 CAPSULES (250MG ) BY MOUTH TWICE DAILY 120 capsule 2   furosemide (LASIX) 20 MG tablet TAKE 1 TABLET (20MG ) BY MOUTH ONCE DAILY 30 tablet 2   metoprolol succinate (TOPROL-XL) 50 MG 24 hr tablet Take 1 tablet (50 mg total) by mouth daily. Take with or immediately  following a meal. 90 tablet 3   OXYGEN Inhale 3 L into the lungs continuous.      polyethylene glycol (MIRALAX / GLYCOLAX) 17 g packet Take 17 g by mouth daily. (Patient taking differently: Take 17 g by mouth daily as needed for mild constipation.) 30 each 0   senna (SENOKOT) 8.6 MG TABS tablet Take 1-2 tablets (8.6-17.2 mg total) by mouth daily. (Patient taking differently: Take 1-2 tablets by mouth daily as needed for mild constipation.) 30 tablet 0   simvastatin (ZOCOR) 20 MG tablet Take 1 tablet (20 mg total) by mouth daily. 90 tablet 0   spironolactone (ALDACTONE) 25 MG tablet TAKE 1 TABLET(25 MG) BY MOUTH DAILY 90 tablet 0   traZODone (DESYREL) 50 MG tablet TAKE 1 TABLET (50MG ) BY MOUTH AT BEDTIME 30 tablet 2   VENTOLIN HFA 108 (90 Base) MCG/ACT inhaler INHALE 1-2 PUFFS INTO THE LUNGS EVERY 6 HOURS AS NEEDED FOR WHEEZING OR SHORTNESS OF BREATH 18 g 2   No current facility-administered medications on file prior to visit.    Observations/Objective: Alert and oriented x 3. Not in acute distress. Physical examination not completed as this is a telemedicine visit.  Assessment and Plan: 1. Other urinary incontinence: - Reports urinary incontinence began at least 12 months ago secondary to CHF, kidney issues, and prescribed medications. Denies bowel incontinence. Reports sometimes unable to make it to the toilet. Denies urinary urge. Adult briefs are helping, requesting medium sized extra absorbent. Prefers to have supplies delivered to his actual room (#145) where he is currently lodging at a local motel. However, reports he is unsure how much longer he will be staying there. States he paid for at least another 2 weeks but unsure if his request is confirmed. He is worried that his supplies will be delivered once he has moved. Reports he will update primary care when he moves.  - [facility] Incontinence Order Form completed today in office. Will ask CMA to fax to the same.  - Follow-up with  primary care as scheduled.   Follow Up Instructions: Follow-up with primary provider as scheduled.    Patient was given clear instructions to go to Emergency Department or return to medical center if symptoms don't improve, worsen, or new problems develop.The patient verbalized understanding.  I discussed the assessment and treatment plan with the patient. The patient was provided an opportunity to ask questions and all were answered. The patient agreed with the plan and demonstrated an understanding of the instructions.   The patient was advised to call back or seek an in-person evaluation if the symptoms worsen or if the condition fails to improve as anticipated.    I provided 8 minutes total of non-face-to-face time during this encounter.   Camillia Herter, NP  [facility] [place], [place] 01/25/2021, 8:19 AM

## 2021-01-25 NOTE — Telephone Encounter (Signed)
Melody Dora from [facility] is calling asking to speak with PCP regarding a mutual patient, Johnny Rodriguez. [place]. Asked for a call whenever possible at [phone]

## 2021-01-25 NOTE — Progress Notes (Signed)
Pt presents for telemedicine visit for [employer] paperwork for incontinence supplies

## 2021-03-05 ENCOUNTER — Other Ambulatory Visit: Payer: Self-pay | Admitting: Family

## 2021-03-05 DIAGNOSIS — I5022 Chronic systolic (congestive) heart failure: Secondary | ICD-10-CM

## 2021-03-05 DIAGNOSIS — F411 Generalized anxiety disorder: Secondary | ICD-10-CM

## 2021-03-07 ENCOUNTER — Other Ambulatory Visit: Payer: Self-pay

## 2021-03-07 DIAGNOSIS — J441 Chronic obstructive pulmonary disease with (acute) exacerbation: Secondary | ICD-10-CM

## 2021-03-07 DIAGNOSIS — G4700 Insomnia, unspecified: Secondary | ICD-10-CM

## 2021-03-07 MED ORDER — VENTOLIN HFA 108 (90 BASE) MCG/ACT IN AERS
INHALATION_SPRAY | RESPIRATORY_TRACT | 2 refills | Status: DC
Start: 2021-03-07 — End: 2021-06-10

## 2021-03-07 MED ORDER — TRAZODONE HCL 50 MG PO TABS: ORAL_TABLET | ORAL | 0 refills | Status: DC

## 2021-03-25 ENCOUNTER — Encounter: Payer: Self-pay | Admitting: Pulmonary Disease

## 2021-03-25 ENCOUNTER — Other Ambulatory Visit: Payer: Self-pay | Admitting: Pulmonary Disease

## 2021-03-25 ENCOUNTER — Other Ambulatory Visit: Payer: Self-pay

## 2021-03-25 ENCOUNTER — Ambulatory Visit (INDEPENDENT_AMBULATORY_CARE_PROVIDER_SITE_OTHER): Payer: Medicare Other | Admitting: Pulmonary Disease

## 2021-03-25 VITALS — BP 134/82 | HR 82 | Ht 70.0 in | Wt 195.0 lb

## 2021-03-25 DIAGNOSIS — F17210 Nicotine dependence, cigarettes, uncomplicated: Secondary | ICD-10-CM

## 2021-03-25 DIAGNOSIS — J449 Chronic obstructive pulmonary disease, unspecified: Secondary | ICD-10-CM

## 2021-03-25 MED ORDER — BREZTRI AEROSPHERE 160-9-4.8 MCG/ACT IN AERO: 2.0000 | INHALATION_SPRAY | Freq: Two times a day (BID) | RESPIRATORY_TRACT | 6 refills | Status: DC

## 2021-03-25 MED ORDER — BREZTRI AEROSPHERE 160-9-4.8 MCG/ACT IN AERO: 2.0000 | INHALATION_SPRAY | Freq: Two times a day (BID) | RESPIRATORY_TRACT | 0 refills | Status: DC

## 2021-03-25 MED ORDER — ALBUTEROL SULFATE (2.5 MG/3ML) 0.083% IN NEBU: 2.5000 mg | INHALATION_SOLUTION | Freq: Four times a day (QID) | RESPIRATORY_TRACT | 12 refills | Status: DC | PRN

## 2021-03-25 NOTE — Patient Instructions (Signed)
Start breztri inhaler, 2 puffs twice daily - rinse mouth out after each use  Continue to use albuterol 1-2 puffs every 4-6 hours as needed  We will order you a nebulizer machine with albuterol solution to use as needed  We will refer you to our lung cancer screening program.

## 2021-03-25 NOTE — Addendum Note (Signed)
Addended by: Maurene Capes on: 03/25/2021 09:51 AM   Modules accepted: Orders

## 2021-03-25 NOTE — Progress Notes (Signed)
Synopsis: Referred in August 2022 for chronic respiratory failure and COPD by Durene Fruits, NP  Subjective:   PATIENT ID: Johnny Rodriguez GENDER: male DOB: 22-Feb-1950, MRN: [id number]   HPI  Chief Complaint  Patient presents with   Follow-up    6 mo f/u for COPD. States his breathing has gotten worse since last visit. Using 3L of O2. Has being albuterol 4-5 times a day.    Johnny Rodriguez is a 71 year old male, daily smoker with atrial fibrillation, CAD, hypertension and cardiomyopathy who returns to pulmonary clinic for respiratory failure and COPD.  He continues on supplemental oxygen and has inogen POC. He was not able to pick up breztri inhaler after last visit due to confusion on which pharmacy he was using. He has been using PRN albuterol and primatene mist inhalers. He is down to smoking 1 cigarette per day.  He reports picking up incruse inhaler yesterday, and is unsure who prescribed it for him.  He would like referral to our lung cancer screening program.   OV 10/01/20 Patient reports he has been on supplemental oxygen since last year.  He complains of dyspnea with any sort of exertion.  He uses a wheelchair to get around.  He reports having Spiriva and Incruse and an albuterol rescue inhaler.  He used to have [facility] but is unsure of his medication history.  He continues to smoke 1 pack/day.  He previously worked in Architect with various dust exposures.  He is enrolled in lung cancer screening program via his primary care team.  The most recent screening CT chest was done on 07/10/2020 and was lung RADS 2.  He has findings of mild centrilobular and paraseptal emphysematous changes.  Spirometry in 2018 showed severe obstructive defect.  FEV1 1 L.  Past Medical History:  Diagnosis Date   AF (atrial fibrillation) (HCC)    Alcohol abuse    Angina pectoris    CAD (coronary artery disease) 12/15/2004   COPD (chronic obstructive pulmonary disease) (HCC)    Coronary artery  disease    Heavy cigarette smoker    HTN (hypertension)    Idiopathic cardiomyopathy ([place]) 07/16/2009   Non compliance with medical treatment    Seizures ([place])      Family History  Problem Relation Age of Onset   Suicidality Sister    CVA Mother    Stroke Mother    Heart disease Father    Coronary artery disease Father    COPD Father      Social History   Socioeconomic History   Marital status: Single    Spouse name: Not on file   Number of children: Not on file   Years of education: Not on file   Highest education level: Not on file  Occupational History   Occupation: Economist: UNEMPLOYED  Tobacco Use   Smoking status: Every Day    Packs/day: 1.00    Years: 58.00    Pack years: 58.00    Types: Cigarettes   Smokeless tobacco: Never   Tobacco comments:    pt states he is down to 1ppd 09/20/2020  Vaping Use   Vaping Use: Never used  Substance and Sexual Activity   Alcohol use: Yes    Alcohol/week: 3.0 standard drinks    Types: 3 Cans of beer per week    Comment: EtOH use diorder - liquor   Drug use: No   Sexual activity: Not on file  Other Topics Concern  Not on file  Social History Narrative   ** Merged History Encounter **       Social Determinants of Health   Financial Resource Strain: Low Risk    Difficulty of Paying Living Expenses: Not hard at all  Food Insecurity: No Food Insecurity   Worried About Charity fundraiser in the Last Year: Never true   Arboriculturist in the Last Year: Never true  Transportation Needs: Public librarian (Medical): Yes   Lack of Transportation (Non-Medical): Yes  Physical Activity: Inactive   Days of Exercise per Week: 0 days   Minutes of Exercise per Session: 0 min  Stress: Stress Concern Present   Feeling of Stress : To some extent  Social Connections: Socially Isolated   Frequency of Communication with Friends and Family: Never   Frequency of Social  Gatherings with Friends and Family: Never   Attends Religious Services: Never   Printmaker: No   Attends Music therapist: Never   Marital Status: Never married  Human resources officer Violence: Not At Risk   Fear of Current or Ex-Partner: No   Emotionally Abused: No   Physically Abused: No   Sexually Abused: No     No Known Allergies   Outpatient Medications Prior to Visit  Medication Sig Dispense Refill   dabigatran (PRADAXA) 150 MG CAPS capsule Take 1 capsule (150 mg total) by mouth 2 (two) times daily. 60 capsule 12   diltiazem (CARDIZEM CD) 240 MG 24 hr capsule Take 1 capsule (240 mg total) by mouth daily. 90 capsule 3   divalproex (DEPAKOTE SPRINKLE) 125 MG capsule TAKE TWO CAPSULES BY MOUTH TWICE DAILY 120 capsule 2   furosemide (LASIX) 20 MG tablet TAKE ONE TABLET (20MG ) BY MOUTH EVERY DAY 30 tablet 2   metoprolol succinate (TOPROL-XL) 50 MG 24 hr tablet Take 1 tablet (50 mg total) by mouth daily. Take with or immediately following a meal. 90 tablet 3   OXYGEN Inhale 3 L into the lungs continuous.      polyethylene glycol (MIRALAX / GLYCOLAX) 17 g packet Take 17 g by mouth daily. (Patient taking differently: Take 17 g by mouth daily as needed for mild constipation.) 30 each 0   senna (SENOKOT) 8.6 MG TABS tablet Take 1-2 tablets (8.6-17.2 mg total) by mouth daily. (Patient taking differently: Take 1-2 tablets by mouth daily as needed for mild constipation.) 30 tablet 0   simvastatin (ZOCOR) 20 MG tablet Take 1 tablet (20 mg total) by mouth daily. 90 tablet 0   spironolactone (ALDACTONE) 25 MG tablet TAKE 1 TABLET(25 MG) BY MOUTH DAILY 90 tablet 0   traZODone (DESYREL) 50 MG tablet TAKE 1 TABLET (50MG ) BY MOUTH AT BEDTIME 90 tablet 0   VENTOLIN HFA 108 (90 Base) MCG/ACT inhaler INHALE 1-2 PUFFS INTO THE LUNGS EVERY 6 HOURS AS NEEDED FOR WHEEZING OR SHORTNESS OF BREATH 18 g 2   Budeson-Glycopyrrol-Formoterol (BREZTRI AEROSPHERE) 160-9-4.8 MCG/ACT  AERO Inhale 2 puffs into the lungs in the morning and at bedtime. (Patient not taking: Reported on 03/25/2021) 10.7 g 6   No facility-administered medications prior to visit.    Review of Systems  Constitutional:  Negative for chills, fever, malaise/fatigue and weight loss.  HENT:  Negative for congestion, sinus pain and sore throat.   Eyes: Negative.   Respiratory:  Positive for cough, sputum production, shortness of breath and wheezing. Negative for hemoptysis.   Cardiovascular:  Negative for chest pain, palpitations, orthopnea, claudication and leg swelling.  Gastrointestinal:  Negative for abdominal pain, heartburn, nausea and vomiting.  Genitourinary: Negative.   Musculoskeletal:  Negative for joint pain and myalgias.  Skin:  Negative for rash.  Neurological:  Negative for weakness.  Endo/Heme/Allergies: Negative.   Psychiatric/Behavioral: Negative.     Objective:   Vitals:   03/25/21 0904  BP: 134/82  Pulse: 82  SpO2: 91%  Weight: 195 lb (88.5 kg)  Height: 5\' 10"  (1.778 m)    Physical Exam Constitutional:      General: He is not in acute distress. HENT:     Head: Normocephalic and atraumatic.  Eyes:     Conjunctiva/sclera: Conjunctivae normal.  Cardiovascular:     Rate and Rhythm: Normal rate and regular rhythm.     Pulses: Normal pulses.     Heart sounds: Normal heart sounds. No murmur heard. Pulmonary:     Breath sounds: Decreased breath sounds present. No wheezing, rhonchi or rales.  Musculoskeletal:     Right lower leg: No edema.     Left lower leg: No edema.  Skin:    General: Skin is warm and dry.  Neurological:     General: No focal deficit present.     Mental Status: He is alert.  Psychiatric:        Mood and Affect: Mood normal.        Behavior: Behavior normal.        Thought Content: Thought content normal.        Judgment: Judgment normal.    CBC    Component Value Date/Time   WBC 7.4 06/20/2020 1115   WBC 11.3 (H) 06/07/2020 0014   RBC  4.90 06/20/2020 1115   RBC 4.10 (L) 06/07/2020 0014   HGB 14.6 06/20/2020 1115   HCT 45.1 06/20/2020 1115   PLT 244 06/20/2020 1115   MCV 92 06/20/2020 1115   MCV 93 02/05/2014 2217   MCH 29.8 06/20/2020 1115   MCH 29.8 06/07/2020 0014   MCHC 32.4 06/20/2020 1115   MCHC 31.7 06/07/2020 0014   RDW 12.3 06/20/2020 1115   RDW 13.1 02/05/2014 2217   LYMPHSABS 0.5 (L) 06/05/2020 1934   MONOABS 0.2 06/05/2020 1934   EOSABS 0.1 06/05/2020 1934   BASOSABS 0.0 06/05/2020 1934   BMP Latest Ref Rng & Units 06/20/2020 06/07/2020 06/06/2020  Glucose 65 - 99 mg/dL 83 103(H) 200(H)  BUN 8 - 27 mg/dL 18 15 8   Creatinine 0.76 - 1.27 mg/dL 1.05 1.02 1.02  BUN/Creat Ratio 10 - 24 17 - -  Sodium 134 - 144 mmol/L 143 136 139  Potassium 3.5 - 5.2 mmol/L 4.6 3.9 4.6  Chloride 96 - 106 mmol/L 98 94(L) 94(L)  CO2 20 - 29 mmol/L 28 32 36(H)  Calcium 8.6 - 10.2 mg/dL 9.6 8.8(L) 9.6   Chest imaging: CT Chest Lung Cancer Screening 07/10/20 Mild diffuse bronchial wall thickening with mild centrilobular and paraseptal emphysema. Lung RADS 2S. Atherosclerosis present.   PFT: No flowsheet data found. Spirometry in 2018 shows severe obstructive defect with an FEV1 of 1 L / 29% predicted   Echo 06/16/19: LVEF 45 to 50%. Global hypokinesis. Mild LVH. RV systolic function is normal. Mildly elevated PA pressure 36.61mmHg. RA mildly dilated.   Assessment & Plan:   Chronic obstructive pulmonary disease, unspecified COPD type ([place]) - Plan: Budeson-Glycopyrrol-Formoterol (BREZTRI AEROSPHERE) 160-9-4.8 MCG/ACT AERO  Cigarette smoker - Plan: Ambulatory Referral for Lung Cancer Scre  Discussion: Johnny Rodriguez  is a 71 year old male, daily smoker with atrial fibrillation, CAD, hypertension and cardiomyopathy who returns to pulmonary clinic for respiratory failure and COPD.  He is to start breztri inhaler 2 puffs twice daily for his COPD. He can continue as needed albuterol inhaler. We will order him a nebulizer machine and  albuterol solution. He is to continue supplemental oxygen.   We will refer him to our lung cancer screening program. I have recommended nicotine lozenges to help him fully quit smoking as he is down to 1 cigarette per day now.  Follow-up in 6 months.  Freda Jackson, MD [facility] Office: [phone]      Current Outpatient Medications:    dabigatran (PRADAXA) 150 MG CAPS capsule, Take 1 capsule (150 mg total) by mouth 2 (two) times daily., Disp: 60 capsule, Rfl: 12   diltiazem (CARDIZEM CD) 240 MG 24 hr capsule, Take 1 capsule (240 mg total) by mouth daily., Disp: 90 capsule, Rfl: 3   divalproex (DEPAKOTE SPRINKLE) 125 MG capsule, TAKE TWO CAPSULES BY MOUTH TWICE DAILY, Disp: 120 capsule, Rfl: 2   furosemide (LASIX) 20 MG tablet, TAKE ONE TABLET (20MG ) BY MOUTH EVERY DAY, Disp: 30 tablet, Rfl: 2   metoprolol succinate (TOPROL-XL) 50 MG 24 hr tablet, Take 1 tablet (50 mg total) by mouth daily. Take with or immediately following a meal., Disp: 90 tablet, Rfl: 3   OXYGEN, Inhale 3 L into the lungs continuous. , Disp: , Rfl:    polyethylene glycol (MIRALAX / GLYCOLAX) 17 g packet, Take 17 g by mouth daily. (Patient taking differently: Take 17 g by mouth daily as needed for mild constipation.), Disp: 30 each, Rfl: 0   senna (SENOKOT) 8.6 MG TABS tablet, Take 1-2 tablets (8.6-17.2 mg total) by mouth daily. (Patient taking differently: Take 1-2 tablets by mouth daily as needed for mild constipation.), Disp: 30 tablet, Rfl: 0   simvastatin (ZOCOR) 20 MG tablet, Take 1 tablet (20 mg total) by mouth daily., Disp: 90 tablet, Rfl: 0   spironolactone (ALDACTONE) 25 MG tablet, TAKE 1 TABLET(25 MG) BY MOUTH DAILY, Disp: 90 tablet, Rfl: 0   traZODone (DESYREL) 50 MG tablet, TAKE 1 TABLET (50MG ) BY MOUTH AT BEDTIME, Disp: 90 tablet, Rfl: 0   VENTOLIN HFA 108 (90 Base) MCG/ACT inhaler, INHALE 1-2 PUFFS INTO THE LUNGS EVERY 6 HOURS AS NEEDED FOR WHEEZING OR SHORTNESS OF BREATH, Disp:  18 g, Rfl: 2   Budeson-Glycopyrrol-Formoterol (BREZTRI AEROSPHERE) 160-9-4.8 MCG/ACT AERO, Inhale 2 puffs into the lungs in the morning and at bedtime., Disp: 10.7 g, Rfl: 6

## 2021-03-25 NOTE — Addendum Note (Signed)
Addended by: Valerie Salts on: 03/25/2021 10:01 AM   Modules accepted: Orders

## 2021-04-15 ENCOUNTER — Telehealth: Payer: Self-pay | Admitting: Clinical

## 2021-04-15 NOTE — Telephone Encounter (Signed)
I contacted pt to check on him. He stated he was doing well. He stated that he was out of his depakote. However stated that he does not need any depakote. He stated that he no longer has an ACTT team and is not interested in any mental health support.  ?

## 2021-04-16 ENCOUNTER — Telehealth: Payer: Self-pay | Admitting: Pulmonary Disease

## 2021-04-17 NOTE — Telephone Encounter (Signed)
Message sent to [employer] and waiting on a response.  ?

## 2021-04-26 IMAGING — DX DG CHEST 1V PORT
2 series · 2 of 2 positions shown · non-contrast
Comparison: Radiograph 02/08/2019, CT 02/09/2019

CLINICAL DATA: Shortness of breath

EXAM:
PORTABLE CHEST 1 VIEW

[chest ap (1 of 2)]
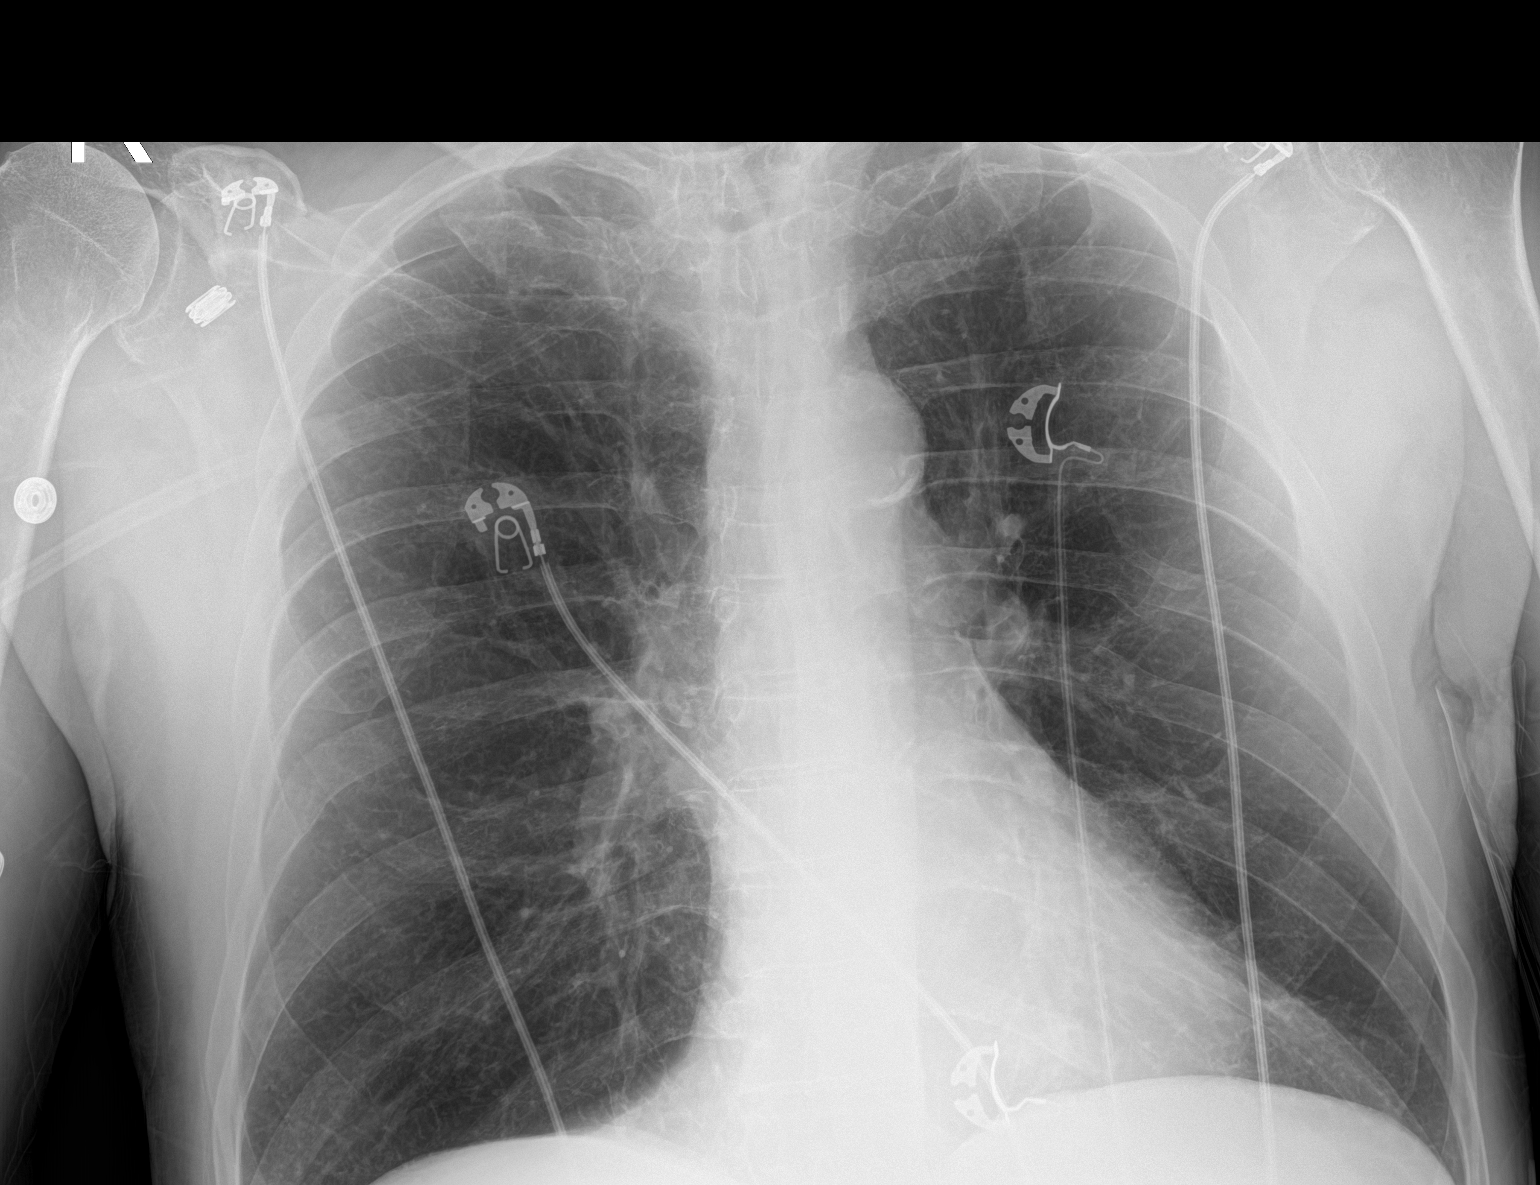

[chest ap (2 of 2)]
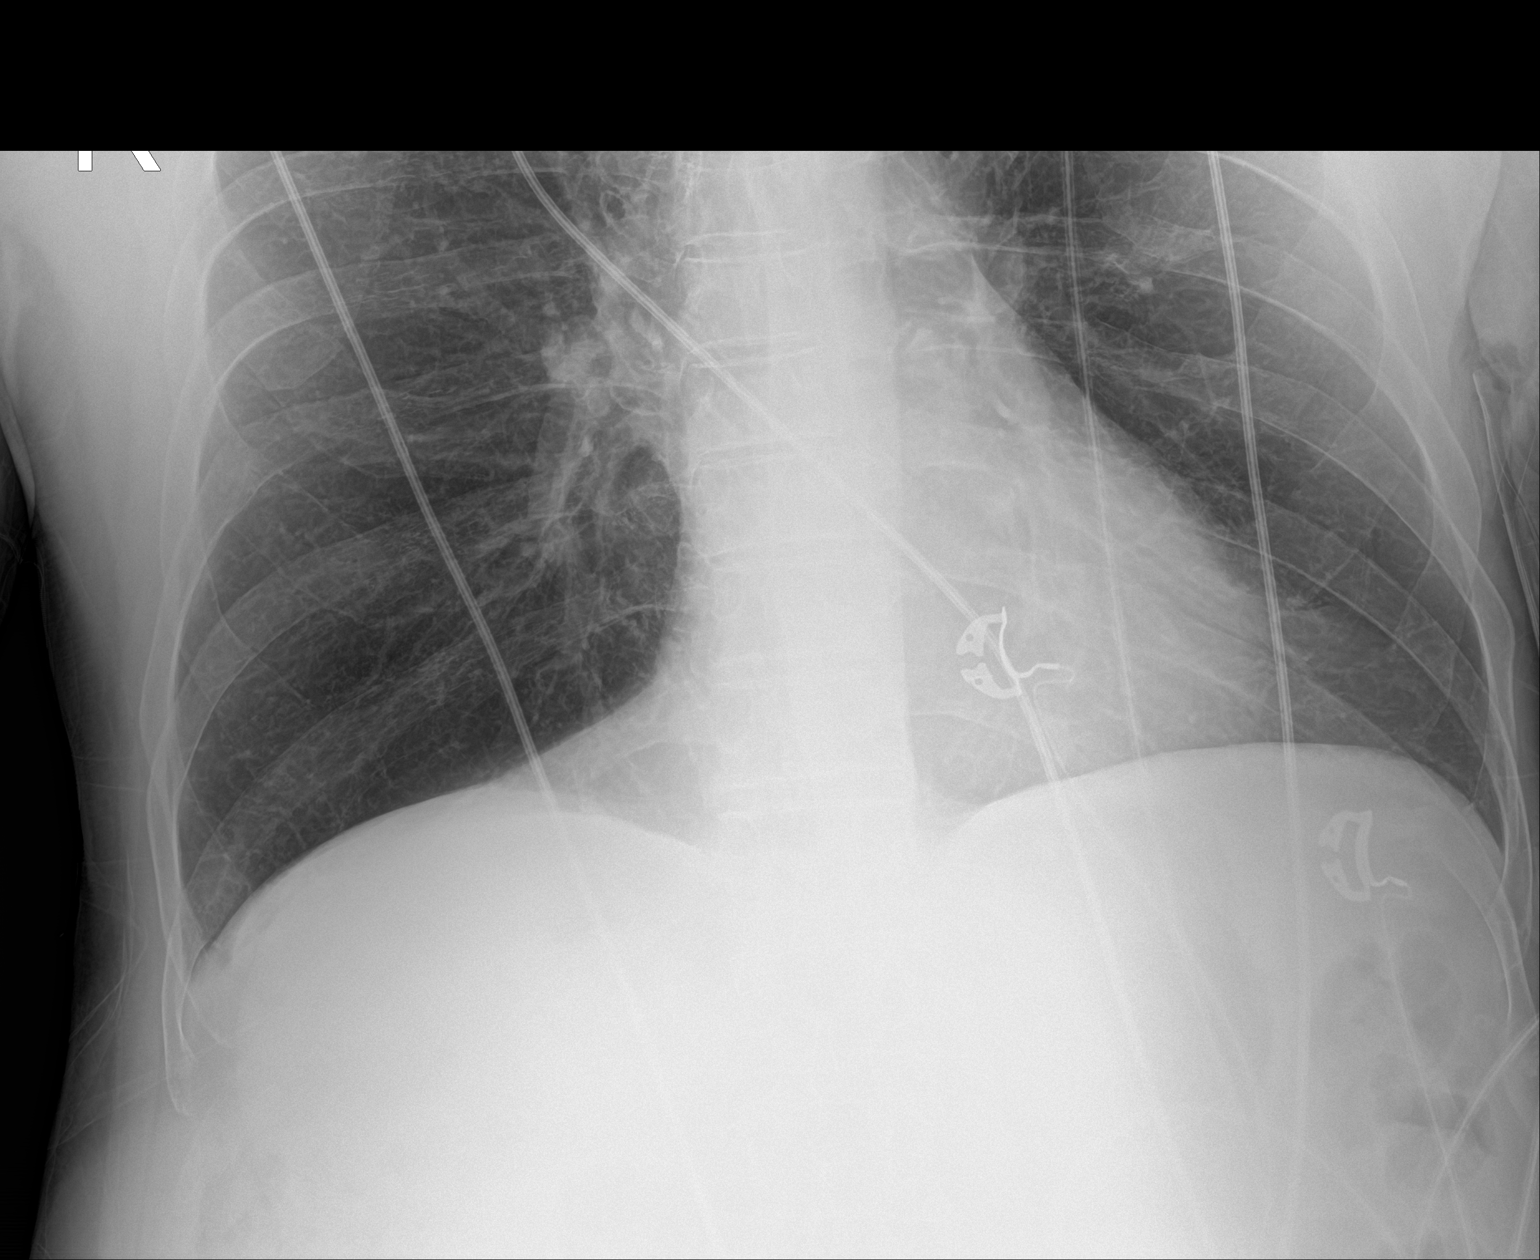

[2 of 2 positions shown; findings below may reference images not displayed]

FINDINGS: Reticulonodular opacity seen in the right lung base on comparison CT
are not well visualized on this exam. No consolidation, features of
edema, pneumothorax, or effusion. The aorta is calcified. The
remaining cardiomediastinal contours are unremarkable.
Redemonstration of the posterior 6 seventh and eighth left rib
fractures. No acute osseous or soft tissue abnormality. Degenerative
changes are present in the imaged spine and shoulders. Telemetry
leads overlie the chest.
IMPRESSION: No acute cardiopulmonary abnormality.

## 2021-05-08 ENCOUNTER — Other Ambulatory Visit: Payer: Self-pay | Admitting: Family

## 2021-05-08 DIAGNOSIS — I480 Paroxysmal atrial fibrillation: Secondary | ICD-10-CM

## 2021-05-08 NOTE — Telephone Encounter (Signed)
Copied from [place] [id number]. Topic: Quick Communication - Rx Refill/Question ?>> May 08, 2021 10:02 AM Leward Quan A wrote: ?Medication: metoprolol succinate (TOPROL-XL) 50 MG 24 hr tablet  ? ?Has the patient contacted their pharmacy? Yes.  Pharmacy called  ?(Agent: If no, request that the patient contact the pharmacy for the refill. If patient does not wish to contact the pharmacy document the reason why and proceed with request.) ?(Agent: If yes, when and what did the pharmacy advise?) ? ?Preferred Pharmacy (with phone number or street name): [facility] - [place], [place] - [address]  ?Phone:  [phone] ?Fax:  [phone] ? ? ? ?Has the patient been seen for an appointment in the last year OR does the patient have an upcoming appointment? Yes.   ? ?Agent: Please be advised that RX refills may take up to 3 business days. We ask that you follow-up with your pharmacy. ?

## 2021-05-09 NOTE — Telephone Encounter (Signed)
Called pt  - Made follow up appointment for 06/05/2021 ?

## 2021-05-09 NOTE — Telephone Encounter (Signed)
Requested medications are due for refill today.  yes ? ?Requested medications are on the active medications list.  yes ? ?Last refill. 06/07/2020 #90 3 refills ? ?Future visit scheduled.   yes ? ?Notes to clinic.  Rx signed by Dr. Mahala Menghini. Follow up appt is scheduled. ? ? ? ?Requested Prescriptions  ?Pending Prescriptions Disp Refills  ? metoprolol succinate (TOPROL-XL) 50 MG 24 hr tablet 90 tablet 3  ?  Sig: Take 1 tablet (50 mg total) by mouth daily. Take with or immediately following a meal.  ?  ? Cardiovascular:  Beta Blockers Failed - 05/08/2021 11:01 AM  ?  ?  Failed - Valid encounter within last 6 months  ?  Recent Outpatient Visits   ? ?      ? 3 months ago Other urinary incontinence  ? Primary Care at [facility], Amy J, NP  ? 6 months ago Chronic left shoulder pain  ? Primary Care at [facility], [place], NP  ? 8 months ago Encounter for [employer] annual wellness exam  ? [facility] Ivonne Andrew, NP  ? 10 months ago Hospital discharge follow-up  ? Primary Care at [facility] [place], NP  ? ?  ?  ?Future Appointments   ? ?        ? In 3 weeks Rema Fendt, NP Primary Care at [facility]  ? ?  ? ?  ?  ?  Passed - Last BP in normal range  ?  BP Readings from Last 1 Encounters:  ?03/25/21 134/82  ?  ?  ?  ?  Passed - Last Heart Rate in normal range  ?  Pulse Readings from Last 1 Encounters:  ?03/25/21 82  ?  ?  ?  ?  ?  ?

## 2021-05-16 ENCOUNTER — Other Ambulatory Visit: Payer: Self-pay

## 2021-05-16 DIAGNOSIS — Z87891 Personal history of nicotine dependence: Secondary | ICD-10-CM

## 2021-05-16 DIAGNOSIS — F17210 Nicotine dependence, cigarettes, uncomplicated: Secondary | ICD-10-CM

## 2021-05-16 DIAGNOSIS — Z122 Encounter for screening for malignant neoplasm of respiratory organs: Secondary | ICD-10-CM

## 2021-06-03 MED ORDER — METOPROLOL SUCCINATE ER 50 MG PO TB24: 50.0000 mg | ORAL_TABLET | Freq: Every day | ORAL | 3 refills | Status: DC

## 2021-06-04 ENCOUNTER — Encounter: Payer: Self-pay | Admitting: Acute Care

## 2021-06-04 ENCOUNTER — Ambulatory Visit (INDEPENDENT_AMBULATORY_CARE_PROVIDER_SITE_OTHER): Payer: Medicare Other | Admitting: Acute Care

## 2021-06-04 DIAGNOSIS — F17210 Nicotine dependence, cigarettes, uncomplicated: Secondary | ICD-10-CM

## 2021-06-04 NOTE — Progress Notes (Signed)
Virtual Visit via Telephone Note ? ?I connected with Johnny Rodriguez on 06/04/21 at  4:30 PM EDT by telephone and verified that I am speaking with the correct person using two identifiers. ? ?Location: ?Patient:  At home ?Provider: [address], [place], [place], [address]  ?  ?I discussed the limitations, risks, security and privacy concerns of performing an evaluation and management service by telephone and the availability of in person appointments. I also discussed with the patient that there may be a patient responsible charge related to this service. The patient expressed understanding and agreed to proceed. ? ? ?Shared Decision Making Visit Lung Cancer Screening Program ?([phone]) ? ? ?Eligibility: ?Age 71 y.o. ?Pack Years Smoking History Calculation 92 pack year smoking history ?(# packs/per year x # years smoked) ?Recent History of coughing up blood  no ?Unexplained weight loss? no ?( >Than 15 pounds within the last 6 months ) ?Prior History Lung / other cancer no ?(Diagnosis within the last 5 years already requiring surveillance chest CT Scans). ?Smoking Status Current Smoker ?Former Smokers: Years since quit: NA ? Quit Date: NA ? ?Visit Components: ?Discussion included one or more decision making aids. yes ?Discussion included risk/benefits of screening. yes ?Discussion included potential follow up diagnostic testing for abnormal scans. yes ?Discussion included meaning and risk of over diagnosis. yes ?Discussion included meaning and risk of False Positives. yes ?Discussion included meaning of total radiation exposure. yes ? ?Counseling Included: ?Importance of adherence to annual lung cancer LDCT screening. yes ?Impact of comorbidities on ability to participate in the program. yes ?Ability and willingness to under diagnostic treatment. yes ? ?Smoking Cessation Counseling: ?Current Smokers:  ?Discussed importance of smoking cessation. yes ?Information about tobacco cessation classes and  interventions provided to patient. yes ?Patient provided with "ticket" for LDCT Scan. yes ?Symptomatic Patient. no ? Counseling NA ?Diagnosis Code: Tobacco Use Z72.0 ?Asymptomatic Patient yes ? Counseling (Intermediate counseling: > three minutes counseling) N0272 ?Former Smokers:  ?Discussed the importance of maintaining cigarette abstinence. yes ?Diagnosis Code: Personal History of Nicotine Dependence. Z36.644 ?Information about tobacco cessation classes and interventions provided to patient. Yes ?Patient provided with "ticket" for LDCT Scan. yes ?Written Order for Lung Cancer Screening with LDCT placed in Epic. Yes ?(CT Chest Lung Cancer Screening Low Dose W/O CM) [id number] ?Z12.2-Screening of respiratory organs ?Z87.891-Personal history of nicotine dependence ? ? ?I have spent 25 minutes of face to face/ virtual visit   time with  Johnny Rodriguez discussing the risks and benefits of lung cancer screening. We viewed / discussed a power point together that explained in detail the above noted topics. We paused at intervals to allow for questions to be asked and answered to ensure understanding.We discussed that the single most powerful action that he can take to decrease his risk of developing lung cancer is to quit smoking. We discussed whether or not he is ready to commit to setting a quit date. We discussed options for tools to aid in quitting smoking including nicotine replacement therapy, non-nicotine medications, support groups, Quit Smart classes, and behavior modification. We discussed that often times setting smaller, more achievable goals, such as eliminating 1 cigarette a day for a week and then 2 cigarettes a day for a week can be helpful in slowly decreasing the number of cigarettes smoked. This allows for a sense of accomplishment as well as providing a clinical benefit. I provided  him  with smoking cessation  information  with contact information for community resources, classes, free  nicotine replacement  therapy, and access to mobile apps, text messaging, and on-line smoking cessation help. I have also provided  him  the office contact information in the event he needs to contact me, or the screening staff. We discussed the time and location of the scan, and that either [name] Miyamoto RN, Karlton Lemon, RN  or I will call / send a letter with the results within 24-72 hours of receiving them. The patient verbalized understanding of all of  the above and had no further questions upon leaving the office. They have my contact information in the event they have any further questions. ? ?I spent 3  minutes counseling on smoking cessation and the health risks of continued tobacco abuse. ? ?I explained to the patient that there has been a high incidence of coronary artery disease noted on these exams. I explained that this is a non-gated exam therefore degree or severity cannot be determined. This patient is on statin therapy. I have asked the patient to follow-up with their PCP regarding any incidental finding of coronary artery disease and management with diet or medication as their PCP  feels is clinically indicated. The patient verbalized understanding of the above and had no further questions upon completion of the visit. ? ?  ? ?Bevelyn Ngo, NP ?06/04/2021 ? ? ? ? ? ? ? ?

## 2021-06-04 NOTE — Patient Instructions (Signed)
Thank you for participating in the [facility]. It was our pleasure to meet you today. We will call you with the results of your scan within the next few days. Your scan will be assigned a Lung RADS category score by the physicians reading the scans.  This Lung RADS score determines follow up scanning.  See below for description of categories, and follow up screening recommendations. We will be in touch to schedule your follow up screening annually or based on recommendations of our providers. We will fax a copy of your scan results to your Primary Care Physician, or the physician who referred you to the program, to ensure they have the results. Please call the office if you have any questions or concerns regarding your scanning experience or results.  Our office number is [phone]. Please speak with Denise Phelps, RN. , or  Denise Buckner RN, They are  our Lung Cancer Screening RN.'s If They are unavailable when you call, Please leave a message on the voice mail. We will return your call at our earliest convenience.This voice mail is monitored several times a day.  Remember, if your scan is normal, we will scan you annually as long as you continue to meet the criteria for the program. (Age 55-77, Current smoker or smoker who has quit within the last 15 years). If you are a smoker, remember, quitting is the single most powerful action that you can take to decrease your risk of lung cancer and other pulmonary, breathing related problems. We know quitting is hard, and we are here to help.  Please let us know if there is anything we can do to help you meet your goal of quitting. If you are a former smoker, congratulations. We are proud of you! Remain smoke free! Remember you can refer friends or family members through the number above.  We will screen them to make sure they meet criteria for the program. Thank you for helping us take better care of you by  participating in Lung Screening.  You can receive free nicotine replacement therapy ( patches, gum or mints) by calling 1-800-QUIT NOW. Please call so we can get you on the path to becoming  a non-smoker. I know it is hard, but you can do this!  Lung RADS Categories:  Lung RADS 1: no nodules or definitely non-concerning nodules.  Recommendation is for a repeat annual scan in 12 months.  Lung RADS 2:  nodules that are non-concerning in appearance and behavior with a very low likelihood of becoming an active cancer. Recommendation is for a repeat annual scan in 12 months.  Lung RADS 3: nodules that are probably non-concerning , includes nodules with a low likelihood of becoming an active cancer.  Recommendation is for a 6-month repeat screening scan. Often noted after an upper respiratory illness. We will be in touch to make sure you have no questions, and to schedule your 6-month scan.  Lung RADS 4 A: nodules with concerning findings, recommendation is most often for a follow up scan in 3 months or additional testing based on our provider's assessment of the scan. We will be in touch to make sure you have no questions and to schedule the recommended 3 month follow up scan.  Lung RADS 4 B:  indicates findings that are concerning. We will be in touch with you to schedule additional diagnostic testing based on our provider's  assessment of the scan.  Other options for assistance in smoking cessation (   As covered by your insurance benefits)  Hypnosis for smoking cessation  [employer] [phone]  Acupuncture for smoking cessation  [facility] [phone]   

## 2021-06-05 ENCOUNTER — Ambulatory Visit: Payer: Medicare Other | Admitting: Family

## 2021-06-06 ENCOUNTER — Other Ambulatory Visit: Payer: Medicare Other

## 2021-06-10 ENCOUNTER — Other Ambulatory Visit: Payer: Self-pay

## 2021-06-10 DIAGNOSIS — I5022 Chronic systolic (congestive) heart failure: Secondary | ICD-10-CM

## 2021-06-10 DIAGNOSIS — G4700 Insomnia, unspecified: Secondary | ICD-10-CM

## 2021-06-10 DIAGNOSIS — J441 Chronic obstructive pulmonary disease with (acute) exacerbation: Secondary | ICD-10-CM

## 2021-06-10 MED ORDER — FUROSEMIDE 20 MG PO TABS: ORAL_TABLET | ORAL | 0 refills | Status: DC

## 2021-06-10 MED ORDER — VENTOLIN HFA 108 (90 BASE) MCG/ACT IN AERS: INHALATION_SPRAY | RESPIRATORY_TRACT | 2 refills | Status: DC

## 2021-06-10 MED ORDER — TRAZODONE HCL 50 MG PO TABS: ORAL_TABLET | ORAL | 0 refills | Status: DC

## 2021-06-14 ENCOUNTER — Telehealth: Payer: Self-pay | Admitting: Acute Care

## 2021-06-18 ENCOUNTER — Other Ambulatory Visit: Payer: Medicare Other

## 2021-06-27 NOTE — Telephone Encounter (Signed)
CT from 5/9 was cancelled and has not been rescheduled yet. Cancelled due to pt not having any transportation.

## 2021-07-02 ENCOUNTER — Other Ambulatory Visit: Payer: Self-pay | Admitting: Family

## 2021-07-02 DIAGNOSIS — I480 Paroxysmal atrial fibrillation: Secondary | ICD-10-CM

## 2021-07-02 NOTE — Telephone Encounter (Signed)
Medication Refill - Medication: dabigatran (PRADAXA) 150 MG CAPS capsule  Has the patient contacted their pharmacy? No. (Pharmacy calling for updated Rx.  I did advise pharmacy pt needs appt, they are going to relay that info to pt. Pt missed his last appt 06/05/2021  Preferred Pharmacy (with phone number or street name): [facility] - [place], [place] [address]een Mans [address] Has the patient been seen for an appointment in the last year OR does the patient have an upcoming appointment? Yes.    Agent: Please be advised that RX refills may take up to 3 business days. We ask that you follow-up with your pharmacy.

## 2021-07-03 ENCOUNTER — Telehealth: Payer: Self-pay

## 2021-07-03 NOTE — Telephone Encounter (Signed)
Requested medication (s) are due for refill today: yes  Requested medication (s) are on the active medication list: yes  Last refill:  06/07/20  Future visit scheduled: no  Notes to clinic:  cannot discern who is refilling this med. Most have been hospitalists.- maybe cardiologist   Requested Prescriptions  Pending Prescriptions Disp Refills   dabigatran (PRADAXA) 150 MG CAPS capsule 60 capsule 12    Sig: Take 1 capsule (150 mg total) by mouth 2 (two) times daily.     Hematology:  Anticoagulants 2 Failed - 07/02/2021  1:18 PM      Failed - PLT in normal range and within 360 days    Platelets  Date Value Ref Range Status  06/20/2020 244 150 - 450 x10E3/uL Final         Failed - HGB in normal range and within 360 days    Hemoglobin  Date Value Ref Range Status  06/20/2020 14.6 13.0 - 17.7 g/dL Final         Failed - HCT in normal range and within 360 days    Hematocrit  Date Value Ref Range Status  06/20/2020 45.1 37.5 - 51.0 % Final         Failed - Cr in normal range and within 360 days    Creatinine  Date Value Ref Range Status  02/05/2014 1.10 0.60 - 1.30 mg/dL Final   Creatinine, Ser  Date Value Ref Range Status  06/20/2020 1.05 0.76 - 1.27 mg/dL Final         Failed - AST in normal range and within 360 days    AST  Date Value Ref Range Status  06/20/2020 23 0 - 40 IU/L Final         Failed - ALT in normal range and within 360 days    ALT  Date Value Ref Range Status  06/20/2020 20 0 - 44 IU/L Final         Failed - eGFR is 10 or above and within 360 days    EGFR (African American)  Date Value Ref Range Status  02/05/2014 >60 >91m/min Final   GFR calc Af Amer  Date Value Ref Range Status  09/05/2019 >60 >60 mL/min Final   EGFR (Non-African Amer.)  Date Value Ref Range Status  02/05/2014 >60 >626mmin Final    Comment:    eGFR values <6043min/1.73 m2 may be an indication of chronic kidney disease (CKD). Calculated eGFR, using the MRDR Study  equation, is useful in  patients with stable renal function. The eGFR calculation will not be reliable in acutely ill patients when serum creatinine is changing rapidly. It is not useful in patients on dialysis. The eGFR calculation may not be applicable to patients at the low and high extremes of body sizes, pregnant women, and vegetarians.    GFR, Estimated  Date Value Ref Range Status  06/07/2020 >60 >60 mL/min Final    Comment:    (NOTE) Calculated using the CKD-EPI Creatinine Equation (2021)    GFR  Date Value Ref Range Status  12/10/2011 65.71 >60.00 mL/min Final   eGFR  Date Value Ref Range Status  06/20/2020 77 >59 mL/min/1.73 Final         Passed - Patient is not pregnant      Passed - Valid encounter within last 12 months    Recent Outpatient Visits           5 months ago Other urinary incontinence   Primary Care at  [place] [facility], Amy J, NP   8 months ago Chronic left shoulder pain   Primary Care at [facility], Amy J, NP   9 months ago Encounter for [employer] annual wellness exam   [place] Fenton Foy, NP   1 year ago Hospital discharge follow-up   Primary Care at [facility], Flonnie Hailstone, NP       Future Appointments             In 3 months Joya Gaskins Burnett Harry, MD [place]

## 2021-07-03 NOTE — Telephone Encounter (Signed)
Called pt and advised him that he is due and office visit. Noted that he canceled 06/05/21.  Pt wishes to see a male doctor and leave [facility]. Appt made with Dr Delford Field for New pt visit 10/29/21. Advised pt to go to [facility] and provided with address and number. Advised pt to call [facility] to see if they will call in adult briefs and possible referral.

## 2021-07-09 NOTE — Telephone Encounter (Signed)
Pt will not be able to get incontinence supplies rx updated till appt w/Amy is completed, due to supply company request progress notes regarding the incontinence.

## 2021-07-30 ENCOUNTER — Telehealth: Payer: Self-pay | Admitting: Family

## 2021-07-30 ENCOUNTER — Other Ambulatory Visit: Payer: Self-pay | Admitting: Family

## 2021-07-31 ENCOUNTER — Other Ambulatory Visit: Payer: Self-pay | Admitting: Family

## 2021-07-31 DIAGNOSIS — F411 Generalized anxiety disorder: Secondary | ICD-10-CM

## 2021-07-31 NOTE — Telephone Encounter (Signed)
Divalproex refills to be managed by established Psychiatry.

## 2021-07-31 NOTE — Telephone Encounter (Signed)
Requested medication (s) are due for refill today: yes  Requested medication (s) are on the active medication list: yes  Last refill:  03/06/21  Future visit scheduled: yes  Notes to clinic:  Unable to refill per protocol due to failed labs, no updated results.      Requested Prescriptions  Pending Prescriptions Disp Refills   divalproex (DEPAKOTE SPRINKLE) 125 MG capsule [Pharmacy Med Name: divalproex 125 mg capsule,delayed release sprinkle] 120 capsule 0    Sig: TAKE TWO CAPSULES BY MOUTH TWICE DAILY     Neurology:  Anticonvulsants - Valproates Failed - 07/31/2021  9:20 AM      Failed - AST in normal range and within 360 days    AST  Date Value Ref Range Status  06/20/2020 23 0 - 40 IU/L Final         Failed - ALT in normal range and within 360 days    ALT  Date Value Ref Range Status  06/20/2020 20 0 - 44 IU/L Final         Failed - HGB in normal range and within 360 days    Hemoglobin  Date Value Ref Range Status  06/20/2020 14.6 13.0 - 17.7 g/dL Final         Failed - PLT in normal range and within 360 days    Platelets  Date Value Ref Range Status  06/20/2020 244 150 - 450 x10E3/uL Final         Failed - WBC in normal range and within 360 days    WBC  Date Value Ref Range Status  06/20/2020 7.4 3.4 - 10.8 x10E3/uL Final  06/07/2020 11.3 (H) 4.0 - 10.5 K/uL Final         Failed - HCT in normal range and within 360 days    Hematocrit  Date Value Ref Range Status  06/20/2020 45.1 37.5 - 51.0 % Final         Failed - Valproic Acid (serum) in normal range and within 360 days    Valproic Acid Lvl  Date Value Ref Range Status  12/10/2019 37 (L) 50.0 - 100.0 ug/mL Final    Comment:    Performed at [facility], [address] [place], [place] [address]         Passed - Completed PHQ-2 or PHQ-9 in the last 360 days      Passed - Patient is not pregnant      Passed - Valid encounter within last 12 months    Recent Outpatient Visits            6 months ago Other urinary incontinence   Primary Care at [facility], Amy J, NP   9 months ago Chronic left shoulder pain   Primary Care at [facility], Salomon Fick, NP   10 months ago Encounter for [employer] annual wellness exam   [facility] Ivonne Andrew, NP   1 year ago Hospital discharge follow-up   Primary Care at [facility], Salomon Fick, NP       Future Appointments             In 3 months Storm Frisk, MD [facility]

## 2021-07-31 NOTE — Telephone Encounter (Signed)
Copied from CRM [id number]. Topic: General - Other >> Jul 31, 2021 11:13 AM Everette C wrote: Reason for CRM: Medication Refill - Medication: divalproex (DEPAKOTE SPRINKLE) 125 MG capsule [[id number]]   Has the patient contacted their pharmacy? Yes.  The patient's pharmacy has made contact on their behalf  (Agent: If no, request that the patient contact the pharmacy for the refill. If patient does not wish to contact the pharmacy document the reason why and proceed with request.) (Agent: If yes, when and what did the pharmacy advise?)  Preferred Pharmacy (with phone number or street name): [facility] - [address] [place], [place] - [address] Laurell Josephs [address] Laurell Josephs [address] [place] [address] Phone: [phone] Fax: [phone] Hours: Not open 24 hours  Has the patient been seen for an appointment in the last year OR does the patient have an upcoming appointment? Yes.    Agent: Please be advised that RX refills may take up to 3 business days. We ask that you follow-up with your pharmacy.

## 2021-09-10 ENCOUNTER — Other Ambulatory Visit: Payer: Self-pay | Admitting: Family

## 2021-09-10 DIAGNOSIS — J441 Chronic obstructive pulmonary disease with (acute) exacerbation: Secondary | ICD-10-CM

## 2021-09-10 NOTE — Telephone Encounter (Signed)
Patient established with Johnny Comas, MD at [facility]. Request refills from the same.

## 2021-09-11 DIAGNOSIS — I119 Hypertensive heart disease without heart failure: Secondary | ICD-10-CM | POA: Diagnosis not present

## 2021-09-11 DIAGNOSIS — M25519 Pain in unspecified shoulder: Secondary | ICD-10-CM | POA: Diagnosis not present

## 2021-09-11 DIAGNOSIS — J9611 Chronic respiratory failure with hypoxia: Secondary | ICD-10-CM | POA: Diagnosis not present

## 2021-09-11 DIAGNOSIS — E559 Vitamin D deficiency, unspecified: Secondary | ICD-10-CM | POA: Diagnosis not present

## 2021-09-11 DIAGNOSIS — Z7189 Other specified counseling: Secondary | ICD-10-CM | POA: Diagnosis not present

## 2021-09-11 DIAGNOSIS — Z23 Encounter for immunization: Secondary | ICD-10-CM | POA: Diagnosis not present

## 2021-09-11 DIAGNOSIS — Z0001 Encounter for general adult medical examination with abnormal findings: Secondary | ICD-10-CM | POA: Diagnosis not present

## 2021-09-11 DIAGNOSIS — R2681 Unsteadiness on feet: Secondary | ICD-10-CM | POA: Diagnosis not present

## 2021-09-11 DIAGNOSIS — I700 Atherosclerosis of aorta: Secondary | ICD-10-CM | POA: Diagnosis not present

## 2021-09-11 DIAGNOSIS — Z1159 Encounter for screening for other viral diseases: Secondary | ICD-10-CM | POA: Diagnosis not present

## 2021-09-11 DIAGNOSIS — Z79899 Other long term (current) drug therapy: Secondary | ICD-10-CM | POA: Diagnosis not present

## 2021-09-15 ENCOUNTER — Other Ambulatory Visit: Payer: Self-pay | Admitting: Family

## 2021-09-15 DIAGNOSIS — G4700 Insomnia, unspecified: Secondary | ICD-10-CM

## 2021-09-15 DIAGNOSIS — I5022 Chronic systolic (congestive) heart failure: Secondary | ICD-10-CM

## 2021-09-15 NOTE — Telephone Encounter (Signed)
Schedule appointment for Trazodone refill.

## 2021-09-15 NOTE — Telephone Encounter (Signed)
Established with [facility]. Request refills from the same.

## 2021-09-19 DIAGNOSIS — K5900 Constipation, unspecified: Secondary | ICD-10-CM | POA: Diagnosis not present

## 2021-09-19 DIAGNOSIS — Z9981 Dependence on supplemental oxygen: Secondary | ICD-10-CM | POA: Diagnosis not present

## 2021-09-19 DIAGNOSIS — J449 Chronic obstructive pulmonary disease, unspecified: Secondary | ICD-10-CM | POA: Diagnosis not present

## 2021-09-19 DIAGNOSIS — M25512 Pain in left shoulder: Secondary | ICD-10-CM | POA: Diagnosis not present

## 2021-09-19 DIAGNOSIS — I4891 Unspecified atrial fibrillation: Secondary | ICD-10-CM | POA: Diagnosis not present

## 2021-09-23 DIAGNOSIS — J449 Chronic obstructive pulmonary disease, unspecified: Secondary | ICD-10-CM | POA: Diagnosis not present

## 2021-10-02 ENCOUNTER — Ambulatory Visit (INDEPENDENT_AMBULATORY_CARE_PROVIDER_SITE_OTHER): Payer: Medicare Other | Admitting: Physician Assistant

## 2021-10-02 ENCOUNTER — Encounter: Payer: Self-pay | Admitting: Physician Assistant

## 2021-10-02 DIAGNOSIS — G8929 Other chronic pain: Secondary | ICD-10-CM | POA: Diagnosis not present

## 2021-10-02 DIAGNOSIS — M25512 Pain in left shoulder: Secondary | ICD-10-CM

## 2021-10-02 MED ORDER — METHYLPREDNISOLONE ACETATE 40 MG/ML IJ SUSP
40.0000 mg | INTRAMUSCULAR | Status: AC | PRN
  Administered 2021-10-02: 40 mg via INTRA_ARTICULAR

## 2021-10-02 MED ORDER — LIDOCAINE HCL 1 % IJ SOLN
3.0000 mL | INTRAMUSCULAR | Status: AC | PRN
  Administered 2021-10-02: 3 mL

## 2021-10-02 NOTE — Progress Notes (Signed)
Office Visit Note   Patient: Johnny Rodriguez           Date of Birth: 01/03/1951           MRN: [id number] Visit Date: 10/02/2021              Requested by: Annita Brod, MD [address] [place],  [place] [address] PCP: Annita Brod, MD   Assessment & Plan: Visit Diagnoses:  1. Chronic left shoulder pain     Plan: Have him undergo an MR arthrogram of the left shoulder rule out internal derangement left shoulder and follow-up with Dr. August Saucer after the study to go over results and discuss further treatment.  Discussed at length today with the patient that he is high risk for any type of surgical intervention given his multiple comorbidities.  Discussed with him coming off of an anticoagulant for surgery could result in adverse effects including paralysis.  Questions were encouraged and answered at length.  Follow-Up Instructions: Return for Review of MRI with Dr. August Saucer.   Orders:  Orders Placed This Encounter  Procedures   Large Joint Inj   MR Shoulder Left w/ contrast   Arthrogram   No orders of the defined types were placed in this encounter.     Procedures: Large Joint Inj: L subacromial bursa on 10/02/2021 4:52 PM Indications: pain Details: 22 G 1.5 in needle, superior approach  Arthrogram: No  Medications: 3 mL lidocaine 1 %; 40 mg methylPREDNISolone acetate 40 MG/ML Outcome: tolerated well, no immediate complications Procedure, treatment alternatives, risks and benefits explained, specific risks discussed. Consent was given by the patient. Immediately prior to procedure a time out was called to verify the correct patient, procedure, equipment, support staff and site/side marked as required. Patient was prepped and draped in the usual sterile fashion.       Clinical Data: No additional findings.   Subjective: Chief Complaint  Patient presents with   Left Shoulder - Pain    HPI Johnny Rodriguez is a 71 year old male who is seen today for the first time for  left shoulder pain.  He states he has had pain for years in his left shoulder and is getting point that it is unbearable.  He states pain is 10 out of 10 pain at worst.  He notes that he has had 2 fractures of his shoulder some 3 years ago.  He wears a sling most of the time on the left arm due to the shoulder pain.  He is someone who is on chronic oxygen and is on chronic anticoagulation due to atrial fib with RVR.  He also has known coronary artery disease, idiopathic cardiomyopathy, is on chronic oxygen due to respiratory failure and COPD.  Patient still smokes pack a day. Left shoulder radiographs dated 10/31/2020 are reviewed: 4 views showed chronic AC joint separation with AC joint arthritic changes.  High riding humeral head with mild to moderate arthritic changes.  Review of Systems See HPI otherwise negative  Objective: Vital Signs: There were no vitals taken for this visit.  Physical Exam Pulmonary:     Effort: Pulmonary effort is normal.  Neurological:     Mental Status: He is alert and oriented to person, place, and time.  Psychiatric:        Mood and Affect: Mood normal.     Ortho Exam Bilateral shoulders: 5 out of 5 strength with external and internal rotation against resistance.  5 out of 5 strength with empty can  test on the right unable to perform on the left secondary to pain.  Forward flexion right shoulder full overhead left shoulder 120 degrees.  Has not chronic AC joint separation with prominence but no tenting of the skin.  Has diffuse tenderness about the shoulder with palpation.  Impingement testing left shoulder reveals slight crepitus and significant pain.  Specialty Comments:  No specialty comments available.  Imaging: No results found.   PMFS History: Patient Active Problem List   Diagnosis Date Noted   Hypocalcemia    Hypokalemia    Atrial fibrillation with RVR (HCC) 06/05/2020   COPD with acute exacerbation (HCC) 03/11/2019   AKI (acute kidney injury)  (HCC) 03/10/2019   Bowel obstruction (HCC) 03/10/2019   Chronic respiratory failure with hypoxia (HCC) 10/15/2016   Cigarette smoker 09/09/2016   Long term current use of anticoagulant therapy 06/24/2016   History of syncope 06/24/2016   COPD exacerbation (HCC) 02/08/2015   Essential hypertension 02/08/2015   Syncope 12/10/2011   History of noncompliance with medical treatment 12/01/2011   Chronic systolic heart failure (HCC) 04/10/2011   Idiopathic cardiomyopathy (HCC) 07/16/2009   Paroxysmal atrial fibrillation (HCC) 07/16/2009   COPD PFT's pending  07/16/2009   CAD (coronary artery disease) 12/15/2004   Past Medical History:  Diagnosis Date   AF (atrial fibrillation) (HCC)    Alcohol abuse    Angina pectoris    CAD (coronary artery disease) 12/15/2004   COPD (chronic obstructive pulmonary disease) (HCC)    Coronary artery disease    Heavy cigarette smoker    HTN (hypertension)    Idiopathic cardiomyopathy (HCC) 07/16/2009   Non compliance with medical treatment    Seizures (HCC)     Family History  Problem Relation Age of Onset   Suicidality Sister    CVA Mother    Stroke Mother    Heart disease Father    Coronary artery disease Father    COPD Father     Past Surgical History:  Procedure Laterality Date   CARDIAC CATHETERIZATION  2006   CARDIAC CATHETERIZATION  12/2004   Left    CARDIOVERSION  01/21/2011   Procedure: CARDIOVERSION;  Surgeon: Darden Palmer., MD;  Location: [facility] OR;  Service: Cardiovascular;  Laterality: N/A;   DOPPLER ECHOCARDIOGRAPHY  02/2011   ELECTROPHYSIOLOGY STUDY N/A 12/16/2011   Procedure: ELECTROPHYSIOLOGY STUDY;  Surgeon: Marinus Maw, MD;  Location: [facility] CATH LAB;  Service: Cardiovascular;  Laterality: N/A;   EP IMPLANTABLE DEVICE N/A 08/24/2015   Procedure: Loop Recorder Removal;  Surgeon: Marinus Maw, MD;  Location: [facility] INVASIVE CV LAB;  Service: Cardiovascular;  Laterality: N/A;   fx collar bone     NO PAST SURGERIES     Social  History   Occupational History   Occupation: Production manager: UNEMPLOYED  Tobacco Use   Smoking status: Every Day    Packs/day: 1.00    Years: 58.00    Total pack years: 58.00    Types: Cigarettes   Smokeless tobacco: Never   Tobacco comments:    pt states he is down to 1ppd 09/20/2020  Vaping Use   Vaping Use: Never used  Substance and Sexual Activity   Alcohol use: Yes    Alcohol/week: 3.0 standard drinks of alcohol    Types: 3 Cans of beer per week    Comment: EtOH use diorder - liquor   Drug use: No   Sexual activity: Not on file

## 2021-10-07 ENCOUNTER — Encounter: Payer: Self-pay | Admitting: Pulmonary Disease

## 2021-10-07 ENCOUNTER — Ambulatory Visit (INDEPENDENT_AMBULATORY_CARE_PROVIDER_SITE_OTHER): Payer: Medicare Other | Admitting: Pulmonary Disease

## 2021-10-07 VITALS — BP 110/76 | HR 63 | Temp 98.0°F | Ht 70.0 in | Wt 177.8 lb

## 2021-10-07 DIAGNOSIS — F17210 Nicotine dependence, cigarettes, uncomplicated: Secondary | ICD-10-CM

## 2021-10-07 DIAGNOSIS — J449 Chronic obstructive pulmonary disease, unspecified: Secondary | ICD-10-CM

## 2021-10-07 MED ORDER — ALBUTEROL SULFATE (2.5 MG/3ML) 0.083% IN NEBU: 2.5000 mg | INHALATION_SOLUTION | RESPIRATORY_TRACT | 12 refills | Status: DC | PRN

## 2021-10-07 MED ORDER — VENTOLIN HFA 108 (90 BASE) MCG/ACT IN AERS: INHALATION_SPRAY | RESPIRATORY_TRACT | 2 refills | Status: DC

## 2021-10-07 MED ORDER — BREZTRI AEROSPHERE 160-9-4.8 MCG/ACT IN AERO: 2.0000 | INHALATION_SPRAY | Freq: Two times a day (BID) | RESPIRATORY_TRACT | 12 refills | Status: DC

## 2021-10-07 NOTE — Patient Instructions (Addendum)
We will need to reschedule you lung cancer screening CT Chest   Continue breztri inhaler 2 puffs twice daily - rinse mouth out after each use  Continue to use albuterol inhaler 1-2 puffs every 4-6 hours as needed  Follow up in 1 year

## 2021-10-07 NOTE — Progress Notes (Unsigned)
Synopsis: Referred in August 2022 for chronic respiratory failure and COPD by Ricky Stabs, NP  Subjective:   PATIENT ID: Johnny Rodriguez GENDER: male DOB: September 02, 1950, MRN: [id number]  HPI  No chief complaint on file.  Lafe Clerk is a 71 year old male, daily smoker with atrial fibrillation, CAD, hypertension and cardiomyopathy who returns to pulmonary clinic for respiratory failure and COPD.  Smoking less than 1 cig per day.    OV 03/25/21 He continues on supplemental oxygen and has inogen POC. He was not able to pick up breztri inhaler after last visit due to confusion on which pharmacy he was using. He has been using PRN albuterol and primatene mist inhalers. He is down to smoking 1 cigarette per day.  He reports picking up incruse inhaler yesterday, and is unsure who prescribed it for him.  He would like referral to our lung cancer screening program.   OV 10/01/20 Patient reports he has been on supplemental oxygen since last year.  He complains of dyspnea with any sort of exertion.  He uses a wheelchair to get around.  He reports having Spiriva and Incruse and an albuterol rescue inhaler.  He used to have [facility] but is unsure of his medication history.  He continues to smoke 1 pack/day.  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  He is enrolled in lung cancer screening program via his primary care team.  The most recent screening CT chest was done on 07/10/2020 and was lung RADS 2.  He has findings of mild centrilobular and paraseptal emphysematous changes.  Spirometry in 2018 showed severe obstructive defect.  FEV1 1 L.  Past Medical History:  Diagnosis Date   AF (atrial fibrillation) (HCC)    Alcohol abuse    Angina pectoris    CAD (coronary artery disease) 12/15/2004   COPD (chronic obstructive pulmonary disease) (HCC)    Coronary artery disease    Heavy cigarette smoker    HTN (hypertension)    Idiopathic cardiomyopathy (HCC) 07/16/2009   Non  compliance with medical treatment    Seizures (HCC)      Family History  Problem Relation Age of Onset   Suicidality Sister    CVA Mother    Stroke Mother    Heart disease Father    Coronary artery disease Father    COPD Father      Social History   Socioeconomic History   Marital status: Single    Spouse name: Not on file   Number of children: Not on file   Years of education: Not on file   Highest education level: Not on file  Occupational History   Occupation: Production manager: UNEMPLOYED  Tobacco Use   Smoking status: Every Day    Packs/day: 1.00    Years: 58.00    Total pack years: 58.00    Types: Cigarettes   Smokeless tobacco: Never   Tobacco comments:    pt states he is down to 1ppd 09/20/2020  Vaping Use   Vaping Use: Never used  Substance and Sexual Activity   Alcohol use: Yes    Alcohol/week: 3.0 standard drinks of alcohol    Types: 3 Cans of beer per week    Comment: EtOH use diorder - liquor   Drug use: No   Sexual activity: Not on file  Other Topics Concern   Not on file  Social History Narrative   ** Merged History Encounter **  Social Determinants of Health   Financial Resource Strain: Low Risk  (09/11/2020)   Overall Financial Resource Strain (CARDIA)    Difficulty of Paying Living Expenses: Not hard at all  Food Insecurity: No Food Insecurity (09/11/2020)   Hunger Vital Sign    Worried About Running Out of Food in the Last Year: Never true    Ran Out of Food in the Last Year: Never true  Transportation Needs: Unmet Transportation Needs (10/03/2021)   PRAPARE - Transportation    Lack of Transportation (Medical): Yes    Lack of Transportation (Non-Medical): Yes  Physical Activity: Inactive (09/11/2020)   Exercise Vital Sign    Days of Exercise per Week: 0 days    Minutes of Exercise per Session: 0 min  Stress: Stress Concern Present (09/11/2020)   [employer] of Occupational Health - Occupational Stress Questionnaire     Feeling of Stress : To some extent  Social Connections: Socially Isolated (09/11/2020)   Social Connection and Isolation Panel [NHANES]    Frequency of Communication with Friends and Family: Never    Frequency of Social Gatherings with Friends and Family: Never    Attends Religious Services: Never    Database administrator or Organizations: No    Attends Banker Meetings: Never    Marital Status: Never married  Intimate Partner Violence: Not At Risk (09/11/2020)   Humiliation, Afraid, Rape, and Kick questionnaire    Fear of Current or Ex-Partner: No    Emotionally Abused: No    Physically Abused: No    Sexually Abused: No     No Known Allergies   Outpatient Medications Prior to Visit  Medication Sig Dispense Refill   albuterol (PROVENTIL) (2.5 MG/3ML) 0.083% nebulizer solution Take 3 mLs (2.5 mg total) by nebulization every 4 (four) hours as needed for wheezing or shortness of breath. 1080 mL 12   Budeson-Glycopyrrol-Formoterol (BREZTRI AEROSPHERE) 160-9-4.8 MCG/ACT AERO Inhale 2 puffs into the lungs in the morning and at bedtime. 10.7 g 6   Budeson-Glycopyrrol-Formoterol (BREZTRI AEROSPHERE) 160-9-4.8 MCG/ACT AERO Inhale 2 puffs into the lungs in the morning and at bedtime. 5.9 g 0   dabigatran (PRADAXA) 150 MG CAPS capsule Take 1 capsule (150 mg total) by mouth 2 (two) times daily. 60 capsule 12   diltiazem (CARDIZEM CD) 240 MG 24 hr capsule Take 1 capsule (240 mg total) by mouth daily. 90 capsule 3   divalproex (DEPAKOTE SPRINKLE) 125 MG capsule TAKE TWO CAPSULES BY MOUTH TWICE DAILY 120 capsule 2   furosemide (LASIX) 20 MG tablet TAKE ONE TABLET (20MG ) BY MOUTH EVERY DAY 90 tablet 0   metoprolol succinate (TOPROL-XL) 50 MG 24 hr tablet Take 1 tablet (50 mg total) by mouth daily. Take with or immediately following a meal. 90 tablet 3   OXYGEN Inhale 3 L into the lungs continuous.      polyethylene glycol (MIRALAX / GLYCOLAX) 17 g packet Take 17 g by mouth daily. (Patient  taking differently: Take 17 g by mouth daily as needed for mild constipation.) 30 each 0   senna (SENOKOT) 8.6 MG TABS tablet Take 1-2 tablets (8.6-17.2 mg total) by mouth daily. (Patient taking differently: Take 1-2 tablets by mouth daily as needed for mild constipation.) 30 tablet 0   simvastatin (ZOCOR) 20 MG tablet Take 1 tablet (20 mg total) by mouth daily. 90 tablet 0   spironolactone (ALDACTONE) 25 MG tablet TAKE 1 TABLET(25 MG) BY MOUTH DAILY 90 tablet 0   traZODone (DESYREL) 50  MG tablet TAKE 1 TABLET (50MG ) BY MOUTH AT BEDTIME 90 tablet 0   VENTOLIN HFA 108 (90 Base) MCG/ACT inhaler INHALE 1-2 PUFFS INTO THE LUNGS EVERY 6 HOURS AS NEEDED FOR WHEEZING OR SHORTNESS OF BREATH 18 g 2   No facility-administered medications prior to visit.    Review of Systems  Constitutional:  Negative for chills, fever, malaise/fatigue and weight loss.  HENT:  Negative for congestion, sinus pain and sore throat.   Eyes: Negative.   Respiratory:  Positive for cough, sputum production, shortness of breath and wheezing. Negative for hemoptysis.   Cardiovascular:  Negative for chest pain, palpitations, orthopnea, claudication and leg swelling.  Gastrointestinal:  Negative for abdominal pain, heartburn, nausea and vomiting.  Genitourinary: Negative.   Musculoskeletal:  Negative for joint pain and myalgias.  Skin:  Negative for rash.  Neurological:  Negative for weakness.  Endo/Heme/Allergies: Negative.   Psychiatric/Behavioral: Negative.      Objective:   There were no vitals filed for this visit.   Physical Exam Constitutional:      General: He is not in acute distress. HENT:     Head: Normocephalic and atraumatic.  Eyes:     Conjunctiva/sclera: Conjunctivae normal.  Cardiovascular:     Rate and Rhythm: Normal rate and regular rhythm.     Pulses: Normal pulses.     Heart sounds: Normal heart sounds. No murmur heard. Pulmonary:     Breath sounds: Decreased breath sounds present. No  wheezing, rhonchi or rales.  Musculoskeletal:     Right lower leg: No edema.     Left lower leg: No edema.  Skin:    General: Skin is warm and dry.  Neurological:     General: No focal deficit present.     Mental Status: He is alert.  Psychiatric:        Mood and Affect: Mood normal.        Behavior: Behavior normal.        Thought Content: Thought content normal.        Judgment: Judgment normal.     CBC    Component Value Date/Time   WBC 7.4 06/20/2020 1115   WBC 11.3 (H) 06/07/2020 0014   RBC 4.90 06/20/2020 1115   RBC 4.10 (L) 06/07/2020 0014   HGB 14.6 06/20/2020 1115   HCT 45.1 06/20/2020 1115   PLT 244 06/20/2020 1115   MCV 92 06/20/2020 1115   MCV 93 02/05/2014 2217   MCH 29.8 06/20/2020 1115   MCH 29.8 06/07/2020 0014   MCHC 32.4 06/20/2020 1115   MCHC 31.7 06/07/2020 0014   RDW 12.3 06/20/2020 1115   RDW 13.1 02/05/2014 2217   LYMPHSABS 0.5 (L) 06/05/2020 1934   MONOABS 0.2 06/05/2020 1934   EOSABS 0.1 06/05/2020 1934   BASOSABS 0.0 06/05/2020 1934      Latest Ref Rng & Units 06/20/2020   11:15 AM 06/07/2020   12:14 AM 06/06/2020    2:50 AM  BMP  Glucose 65 - 99 mg/dL 83  06/08/2020  938   BUN 8 - 27 mg/dL 18  15  8    Creatinine 0.76 - 1.27 mg/dL 182   9.93   BUN/Creat Ratio 10 - 24 17     Sodium 134 - 144 mmol/L 143  136  139   Potassium 3.5 - 5.2 mmol/L 4.6  3.9  4.6   Chloride 96 - 106 mmol/L 98  94  94   CO2 20 - 29 mmol/L 28  32  36   Calcium 8.6 - 10.2 mg/dL 9.6  8.8  9.6    Chest imaging: CT Chest Lung Cancer Screening 07/10/20 Mild diffuse bronchial wall thickening with mild centrilobular and paraseptal emphysema. Lung RADS 2S. Atherosclerosis present.   PFT:     No data to display         Spirometry in 2018 shows severe obstructive defect with an FEV1 of 1 L / 29% predicted   Echo 06/16/19: LVEF 45 to 50%. Global hypokinesis. Mild LVH. RV systolic function is normal. Mildly elevated PA pressure 36.89mmHg. RA mildly dilated.    Assessment & Plan:   No diagnosis found.  Discussion: Juvon Huenefeld is a 71 year old male, daily smoker with atrial fibrillation, CAD, hypertension and cardiomyopathy who returns to pulmonary clinic for respiratory failure and COPD.  He is to start breztri inhaler 2 puffs twice daily for his COPD. He can continue as needed albuterol inhaler. We will order him a nebulizer machine and albuterol solution. He is to continue supplemental oxygen.   We will refer him to our lung cancer screening program. I have recommended nicotine lozenges to help him fully quit smoking as he is down to 1 cigarette per day now.  Follow-up in 6 months.  Melody Comas, MD [facility] Office: [phone]      Current Outpatient Medications:    albuterol (PROVENTIL) (2.5 MG/3ML) 0.083% nebulizer solution, Take 3 mLs (2.5 mg total) by nebulization every 4 (four) hours as needed for wheezing or shortness of breath., Disp: 1080 mL, Rfl: 12   Budeson-Glycopyrrol-Formoterol (BREZTRI AEROSPHERE) 160-9-4.8 MCG/ACT AERO, Inhale 2 puffs into the lungs in the morning and at bedtime., Disp: 10.7 g, Rfl: 6   Budeson-Glycopyrrol-Formoterol (BREZTRI AEROSPHERE) 160-9-4.8 MCG/ACT AERO, Inhale 2 puffs into the lungs in the morning and at bedtime., Disp: 5.9 g, Rfl: 0   dabigatran (PRADAXA) 150 MG CAPS capsule, Take 1 capsule (150 mg total) by mouth 2 (two) times daily., Disp: 60 capsule, Rfl: 12   diltiazem (CARDIZEM CD) 240 MG 24 hr capsule, Take 1 capsule (240 mg total) by mouth daily., Disp: 90 capsule, Rfl: 3   divalproex (DEPAKOTE SPRINKLE) 125 MG capsule, TAKE TWO CAPSULES BY MOUTH TWICE DAILY, Disp: 120 capsule, Rfl: 2   furosemide (LASIX) 20 MG tablet, TAKE ONE TABLET (20MG ) BY MOUTH EVERY DAY, Disp: 90 tablet, Rfl: 0   metoprolol succinate (TOPROL-XL) 50 MG 24 hr tablet, Take 1 tablet (50 mg total) by mouth daily. Take with or immediately following a meal., Disp: 90 tablet, Rfl: 3   OXYGEN,  Inhale 3 L into the lungs continuous. , Disp: , Rfl:    polyethylene glycol (MIRALAX / GLYCOLAX) 17 g packet, Take 17 g by mouth daily. (Patient taking differently: Take 17 g by mouth daily as needed for mild constipation.), Disp: 30 each, Rfl: 0   senna (SENOKOT) 8.6 MG TABS tablet, Take 1-2 tablets (8.6-17.2 mg total) by mouth daily. (Patient taking differently: Take 1-2 tablets by mouth daily as needed for mild constipation.), Disp: 30 tablet, Rfl: 0   simvastatin (ZOCOR) 20 MG tablet, Take 1 tablet (20 mg total) by mouth daily., Disp: 90 tablet, Rfl: 0   spironolactone (ALDACTONE) 25 MG tablet, TAKE 1 TABLET(25 MG) BY MOUTH DAILY, Disp: 90 tablet, Rfl: 0   traZODone (DESYREL) 50 MG tablet, TAKE 1 TABLET (50MG ) BY MOUTH AT BEDTIME, Disp: 90 tablet, Rfl: 0   VENTOLIN HFA 108 (90 Base) MCG/ACT inhaler, INHALE 1-2 PUFFS  INTO THE LUNGS EVERY 6 HOURS AS NEEDED FOR WHEEZING OR SHORTNESS OF BREATH, Disp: 18 g, Rfl: 2

## 2021-10-08 ENCOUNTER — Telehealth: Payer: Self-pay

## 2021-10-08 ENCOUNTER — Encounter: Payer: Self-pay | Admitting: Pulmonary Disease

## 2021-10-08 NOTE — Telephone Encounter (Signed)
Contacted patient by phone to reschedule LDCT. Patient states he needs it scheduled at a [facility] facility.  When trying to find a date at [facility], patient states he's in the process of moving and needs a call back at another time.

## 2021-10-22 ENCOUNTER — Ambulatory Visit
Admission: RE | Admit: 2021-10-22 | Discharge: 2021-10-22 | Disposition: A | Discharge status: Home / Self Care | Payer: Medicare Other | Source: Ambulatory Visit | Attending: Physician Assistant | Admitting: Physician Assistant

## 2021-10-22 DIAGNOSIS — G8929 Other chronic pain: Secondary | ICD-10-CM

## 2021-10-22 MED ORDER — IOPAMIDOL (ISOVUE-M 200) INJECTION 41%
13.0000 mL | Freq: Once | INTRAMUSCULAR | Status: AC
  Administered 2021-10-22: 13 mL via INTRA_ARTICULAR

## 2021-10-28 ENCOUNTER — Ambulatory Visit: Payer: Medicare Other | Admitting: Orthopaedic Surgery

## 2021-10-29 ENCOUNTER — Ambulatory Visit: Payer: Medicare Other | Admitting: Critical Care Medicine

## 2021-11-22 ENCOUNTER — Other Ambulatory Visit: Payer: Self-pay | Admitting: Family

## 2021-11-22 DIAGNOSIS — F411 Generalized anxiety disorder: Secondary | ICD-10-CM

## 2021-11-22 DIAGNOSIS — G4700 Insomnia, unspecified: Secondary | ICD-10-CM

## 2021-11-22 DIAGNOSIS — I5022 Chronic systolic (congestive) heart failure: Secondary | ICD-10-CM

## 2021-11-22 DIAGNOSIS — J449 Chronic obstructive pulmonary disease, unspecified: Secondary | ICD-10-CM

## 2021-11-26 ENCOUNTER — Encounter: Payer: Self-pay | Admitting: Gastroenterology

## 2021-12-04 ENCOUNTER — Telehealth: Payer: Self-pay

## 2021-12-04 NOTE — Telephone Encounter (Signed)
Spoke with pt. He will call back to schedule OV with MD or APP. PV and colonoscopy cancelled.

## 2021-12-04 NOTE — Telephone Encounter (Signed)
Only contact # voicemail "not set up".  Attempting to reach pt to schedule OV. Pt is on oxygen and will need OV before PV and colonoscopy.

## 2021-12-17 ENCOUNTER — Other Ambulatory Visit: Payer: Self-pay | Admitting: Family

## 2021-12-17 DIAGNOSIS — J449 Chronic obstructive pulmonary disease, unspecified: Secondary | ICD-10-CM

## 2021-12-17 DIAGNOSIS — I5022 Chronic systolic (congestive) heart failure: Secondary | ICD-10-CM

## 2021-12-17 DIAGNOSIS — G4700 Insomnia, unspecified: Secondary | ICD-10-CM

## 2021-12-17 NOTE — Telephone Encounter (Signed)
Requested medication (s) are due for refill today - yes  Requested medication (s) are on the active medication list -yes  Future visit scheduled -no  Last refill: ventolin 10/07/21 18g 2RF- outside provider                  Furosemide 06/10/21 #90                  Trazidone   06/10/21 #90   Notes to clinic: Per last contact from [facility]- patient had wanted male provider and made appointment with [facility]- no showed NP appointment- not sure patient is still current with practice  Requested Prescriptions  Pending Prescriptions Disp Refills   VENTOLIN HFA 108 (90 Base) MCG/ACT inhaler [Pharmacy Med Name: Ventolin HFA 90 mcg/actuation aerosol inhaler] 18 g 2    Sig: INHALE 1-2 PUFFS BY MOUTH EVERY 6 HOURS AS NEEDED FOR WHEEZING OR SHORTNESS OF BREATH     Pulmonology:  Beta Agonists 2 Failed - 12/17/2021 10:25 AM      Failed - Valid encounter within last 12 months    Recent Outpatient Visits           10 months ago Other urinary incontinence   Primary Care at [facility], [place], NP   1 year ago Chronic left shoulder pain   Primary Care at [facility], [place], NP   1 year ago Encounter for [employer] annual wellness exam   [place], Kriste Basque, NP   1 year ago Hospital discharge follow-up   Primary Care at [facility], Amy J, NP              Passed - Last BP in normal range    BP Readings from Last 1 Encounters:  10/07/21 110/76         Passed - Last Heart Rate in normal range    Pulse Readings from Last 1 Encounters:  10/07/21 63          furosemide (LASIX) 20 MG tablet [Pharmacy Med Name: furosemide 20 mg tablet] 90 tablet 2    Sig: TAKE ONE TABLET BY MOUTH EVERY DAY     Cardiovascular:  Diuretics - Loop Failed - 12/17/2021 10:25 AM      Failed - K in normal range and within 180 days    Potassium  Date Value Ref Range Status  06/20/2020 4.6 3.5 - 5.2 mmol/L Final  02/05/2014 4.2 3.5 - 5.1 mmol/L Final          Failed - Ca in normal range and within 180 days    Calcium  Date Value Ref Range Status  06/20/2020 9.6 8.6 - 10.2 mg/dL Final   Calcium, Total  Date Value Ref Range Status  02/05/2014 8.8 8.5 - 10.1 mg/dL Final   Calcium, Ion  Date Value Ref Range Status  09/05/2019 1.16 1.15 - 1.40 mmol/L Final         Failed - Na in normal range and within 180 days    Sodium  Date Value Ref Range Status  06/20/2020 143 134 - 144 mmol/L Final  02/05/2014 139 136 - 145 mmol/L Final         Failed - Cr in normal range and within 180 days    Creatinine  Date Value Ref Range Status  02/05/2014 1.10 0.60 - 1.30 mg/dL Final   Creatinine, Ser  Date Value Ref Range Status  06/20/2020 1.05 0.76 - 1.27 mg/dL  Final         Failed - Cl in normal range and within 180 days    Chloride  Date Value Ref Range Status  06/20/2020 98 96 - 106 mmol/L Final  02/05/2014 104 98 - 107 mmol/L Final         Failed - Mg Level in normal range and within 180 days    Magnesium  Date Value Ref Range Status  06/07/2020 1.9 1.7 - 2.4 mg/dL Final    Comment:    Performed at [facility], [place] [address], [place], [place] [address]         Failed - Valid encounter within last 6 months    Recent Outpatient Visits           10 months ago Other urinary incontinence   Primary Care at [facility], [place], NP   1 year ago Chronic left shoulder pain   Primary Care at [facility], [place], NP   1 year ago Encounter for [employer] annual wellness exam   [place], NP   1 year ago Hospital discharge follow-up   Primary Care at [facility], [place], NP              Passed - Last BP in normal range    BP Readings from Last 1 Encounters:  10/07/21 110/76          traZODone (DESYREL) 50 MG tablet [Pharmacy Med Name: trazodone 50 mg tablet] 90 tablet 2    Sig: TAKE ONE TABLET BY MOUTH AT BEDTIME     Psychiatry:  Antidepressants - Serotonin Modulator Failed - 12/17/2021 10:25 AM      Failed - Valid encounter within last 6 months    Recent Outpatient Visits           10 months ago Other urinary incontinence   Primary Care at [facility], [place], NP   1 year ago Chronic left shoulder pain   Primary Care at [facility], [place], NP   1 year ago Encounter for [employer] annual wellness exam   [place] Fenton Foy, NP   1 year ago Hospital discharge follow-up   Primary Care at [facility], Fl[place], NP                 Requested Prescriptions  Pending Prescriptions Disp Refills   VENTOLIN HFA 108 (90 Base) MCG/ACT inhaler [Pharmacy Med Name: Ventolin HFA 90 mcg/actuation aerosol inhaler] 18 g 2    Sig: INHALE 1-2 PUFFS BY MOUTH EVERY 6 HOURS AS NEEDED FOR WHEEZING OR SHORTNESS OF BREATH     Pulmonology:  Beta Agonists 2 Failed - 12/17/2021 10:25 AM      Failed - Valid encounter within last 12 months    Recent Outpatient Visits           10 months ago Other urinary incontinence   Primary Care at [facility], [place], NP   1 year ago Chronic left shoulder pain   Primary Care at [facility], [place], NP   1 year ago Encounter for [employer] annual wellness exam   [place] Fenton Foy, NP   1 year ago Hospital discharge follow-up   Primary Care at [facility], Flonnie Hail[place], NP  Passed - Last BP in normal range    BP Readings from Last 1 Encounters:  10/07/21 110/76         Passed - Last Heart Rate in normal range    Pulse Readings from Last 1 Encounters:  10/07/21 63          furosemide (LASIX) 20 MG tablet [Pharmacy Med Name: furosemide 20 mg tablet] 90 tablet 2    Sig: TAKE ONE TABLET BY MOUTH EVERY DAY     Cardiovascular:  Diuretics - Loop Failed - 12/17/2021 10:25 AM      Failed - K in normal range and within 180 days    Potassium   Date Value Ref Range Status  06/20/2020 4.6 3.5 - 5.2 mmol/L Final  02/05/2014 4.2 3.5 - 5.1 mmol/L Final         Failed - Ca in normal range and within 180 days    Calcium  Date Value Ref Range Status  06/20/2020 9.6 8.6 - 10.2 mg/dL Final   Calcium, Total  Date Value Ref Range Status  02/05/2014 8.8 8.5 - 10.1 mg/dL Final   Calcium, Ion  Date Value Ref Range Status  09/05/2019 1.16 1.15 - 1.40 mmol/L Final         Failed - Na in normal range and within 180 days    Sodium  Date Value Ref Range Status  06/20/2020 143 134 - 144 mmol/L Final  02/05/2014 139 136 - 145 mmol/L Final         Failed - Cr in normal range and within 180 days    Creatinine  Date Value Ref Range Status  02/05/2014 1.10 0.60 - 1.30 mg/dL Final   Creatinine, Ser  Date Value Ref Range Status  06/20/2020 1.05 0.76 - 1.27 mg/dL Final         Failed - Cl in normal range and within 180 days    Chloride  Date Value Ref Range Status  06/20/2020 98 96 - 106 mmol/L Final  02/05/2014 104 98 - 107 mmol/L Final         Failed - Mg Level in normal range and within 180 days    Magnesium  Date Value Ref Range Status  06/07/2020 1.9 1.7 - 2.4 mg/dL Final    Comment:    Performed at [facility], [place] [address], [place], [place] [address]         Failed - Valid encounter within last 6 months    Recent Outpatient Visits           10 months ago Other urinary incontinence   Primary Care at [facility], [place], NP   1 year ago Chronic left shoulder pain   Primary Care at [facility], [place], NP   1 year ago Encounter for [employer] annual wellness exam   [place] Fenton Foy, NP   1 year ago Hospital discharge follow-up   Primary Care at [facility], [place], NP              Passed - Last BP in normal range    BP Readings from Last 1 Encounters:  10/07/21 110/76          traZODone (DESYREL) 50 MG tablet [Pharmacy Med  Name: trazodone 50 mg tablet] 90 tablet 2    Sig: TAKE ONE TABLET BY MOUTH AT BEDTIME     Psychiatry: Antidepressants - Serotonin Modulator Failed - 12/17/2021  10:25 AM      Failed - Valid encounter within last 6 months    Recent Outpatient Visits           10 months ago Other urinary incontinence   Primary Care at [facility], [place], NP   1 year ago Chronic left shoulder pain   Primary Care at [place], [place], NP   1 year ago Encounter for [employer] annual wellness exam   [place] Fenton Foy, NP   1 year ago Hospital discharge follow-up   Primary Care at [facility] [address], Flonnie Hail[place], NP

## 2021-12-23 ENCOUNTER — Other Ambulatory Visit: Payer: Self-pay | Admitting: Family

## 2021-12-23 DIAGNOSIS — G4700 Insomnia, unspecified: Secondary | ICD-10-CM

## 2021-12-23 DIAGNOSIS — I5022 Chronic systolic (congestive) heart failure: Secondary | ICD-10-CM

## 2021-12-23 DIAGNOSIS — J449 Chronic obstructive pulmonary disease, unspecified: Secondary | ICD-10-CM

## 2021-12-25 ENCOUNTER — Other Ambulatory Visit: Payer: Self-pay | Admitting: Nurse Practitioner

## 2021-12-25 ENCOUNTER — Ambulatory Visit
Admission: RE | Admit: 2021-12-25 | Discharge: 2021-12-25 | Disposition: A | Discharge status: Home / Self Care | Payer: Medicare Other | Source: Ambulatory Visit | Attending: Nurse Practitioner | Admitting: Nurse Practitioner

## 2021-12-25 ENCOUNTER — Telehealth: Payer: Self-pay

## 2021-12-25 DIAGNOSIS — M25512 Pain in left shoulder: Secondary | ICD-10-CM

## 2021-12-25 DIAGNOSIS — M25511 Pain in right shoulder: Secondary | ICD-10-CM

## 2021-12-25 DIAGNOSIS — M25531 Pain in right wrist: Secondary | ICD-10-CM

## 2021-12-25 NOTE — Telephone Encounter (Signed)
Spoke to Johnny Rodriguez w/ Internal Medicine provider Annita Brod MD in regards to pt being their seen w/them.Johnny Rodriguez confirmed that Johnny Rodriguez is pt of their office and they now handle his care. Will notify [employer] of the change

## 2022-01-01 ENCOUNTER — Encounter: Payer: Medicare Other | Admitting: Gastroenterology

## 2022-03-26 ENCOUNTER — Emergency Department (HOSPITAL_COMMUNITY): Payer: 59

## 2022-03-26 ENCOUNTER — Encounter (HOSPITAL_COMMUNITY): Payer: Self-pay

## 2022-03-26 ENCOUNTER — Inpatient Hospital Stay (HOSPITAL_COMMUNITY)
Admission: EM | Admit: 2022-03-26 | Discharge: 2022-03-28 | DRG: 176 | Disposition: A | Discharge status: Home / Self Care | Payer: 59 | Attending: Family Medicine | Admitting: Family Medicine

## 2022-03-26 ENCOUNTER — Other Ambulatory Visit: Payer: Self-pay

## 2022-03-26 DIAGNOSIS — Z79899 Other long term (current) drug therapy: Secondary | ICD-10-CM

## 2022-03-26 DIAGNOSIS — Z1152 Encounter for screening for COVID-19: Secondary | ICD-10-CM

## 2022-03-26 DIAGNOSIS — Z66 Do not resuscitate: Secondary | ICD-10-CM | POA: Diagnosis present

## 2022-03-26 DIAGNOSIS — Z7901 Long term (current) use of anticoagulants: Secondary | ICD-10-CM

## 2022-03-26 DIAGNOSIS — J9601 Acute respiratory failure with hypoxia: Secondary | ICD-10-CM | POA: Diagnosis present

## 2022-03-26 DIAGNOSIS — I110 Hypertensive heart disease with heart failure: Secondary | ICD-10-CM | POA: Diagnosis present

## 2022-03-26 DIAGNOSIS — Z9981 Dependence on supplemental oxygen: Secondary | ICD-10-CM

## 2022-03-26 DIAGNOSIS — R000 Tachycardia, unspecified: Secondary | ICD-10-CM | POA: Diagnosis present

## 2022-03-26 DIAGNOSIS — F17210 Nicotine dependence, cigarettes, uncomplicated: Secondary | ICD-10-CM | POA: Diagnosis present

## 2022-03-26 DIAGNOSIS — I480 Paroxysmal atrial fibrillation: Secondary | ICD-10-CM | POA: Diagnosis present

## 2022-03-26 DIAGNOSIS — I5022 Chronic systolic (congestive) heart failure: Secondary | ICD-10-CM | POA: Diagnosis present

## 2022-03-26 DIAGNOSIS — I2699 Other pulmonary embolism without acute cor pulmonale: Secondary | ICD-10-CM | POA: Diagnosis present

## 2022-03-26 DIAGNOSIS — J439 Emphysema, unspecified: Secondary | ICD-10-CM | POA: Diagnosis present

## 2022-03-26 DIAGNOSIS — J441 Chronic obstructive pulmonary disease with (acute) exacerbation: Principal | ICD-10-CM | POA: Diagnosis present

## 2022-03-26 DIAGNOSIS — I2693 Single subsegmental pulmonary embolism without acute cor pulmonale: Secondary | ICD-10-CM | POA: Diagnosis not present

## 2022-03-26 DIAGNOSIS — R0602 Shortness of breath: Secondary | ICD-10-CM | POA: Diagnosis not present

## 2022-03-26 DIAGNOSIS — I2510 Atherosclerotic heart disease of native coronary artery without angina pectoris: Secondary | ICD-10-CM | POA: Diagnosis present

## 2022-03-26 DIAGNOSIS — E785 Hyperlipidemia, unspecified: Secondary | ICD-10-CM | POA: Diagnosis present

## 2022-03-26 DIAGNOSIS — R0600 Dyspnea, unspecified: Secondary | ICD-10-CM | POA: Diagnosis present

## 2022-03-26 DIAGNOSIS — J9809 Other diseases of bronchus, not elsewhere classified: Secondary | ICD-10-CM

## 2022-03-26 DIAGNOSIS — F39 Unspecified mood [affective] disorder: Secondary | ICD-10-CM | POA: Diagnosis present

## 2022-03-26 DIAGNOSIS — Z7951 Long term (current) use of inhaled steroids: Secondary | ICD-10-CM

## 2022-03-26 LAB — TROPONIN I (HIGH SENSITIVITY)
Troponin I (High Sensitivity): 5 ng/L (ref <18)
Troponin I (High Sensitivity): 5 ng/L (ref <18)

## 2022-03-26 LAB — CBC WITH DIFFERENTIAL/PLATELET
Abs Immature Granulocytes: 0.02 10*3/uL (ref 0.00–0.07)
Basophils Absolute: 0 10*3/uL (ref 0.0–0.1)
Basophils Relative: 0 %
Eosinophils Absolute: 0.1 10*3/uL (ref 0.0–0.5)
Eosinophils Relative: 1 %
HCT: 43.3 % (ref 39.0–52.0)
Hemoglobin: 13.6 g/dL (ref 13.0–17.0)
Immature Granulocytes: 0 %
Lymphocytes Relative: 15 %
Lymphs Abs: 1.2 10*3/uL (ref 0.7–4.0)
MCH: 31 pg (ref 26.0–34.0)
MCHC: 31.4 g/dL (ref 30.0–36.0)
MCV: 98.6 fL (ref 80.0–100.0)
Monocytes Absolute: 0.5 10*3/uL (ref 0.1–1.0)
Monocytes Relative: 6 %
Neutro Abs: 5.8 10*3/uL (ref 1.7–7.7)
Neutrophils Relative %: 78 %
Platelets: 219 10*3/uL (ref 150–400)
RBC: 4.39 MIL/uL (ref 4.22–5.81)
RDW: 12.7 % (ref 11.5–15.5)
WBC: 7.5 10*3/uL (ref 4.0–10.5)
nRBC: 0 % (ref 0.0–0.2)

## 2022-03-26 LAB — LIPASE, BLOOD: Lipase: 28 U/L (ref 11–51)

## 2022-03-26 LAB — COMPREHENSIVE METABOLIC PANEL
ALT: 16 U/L (ref 0–44)
AST: 23 U/L (ref 15–41)
Albumin: 3.6 g/dL (ref 3.5–5.0)
Alkaline Phosphatase: 52 U/L (ref 38–126)
Anion gap: 9 (ref 5–15)
BUN: 11 mg/dL (ref 8–23)
CO2: 36 mmol/L — ABNORMAL HIGH (ref 22–32)
Calcium: 9 mg/dL (ref 8.9–10.3)
Chloride: 95 mmol/L — ABNORMAL LOW (ref 98–111)
Creatinine, Ser: 0.98 mg/dL (ref 0.61–1.24)
GFR, Estimated: >60 mL/min (ref >60)
Glucose, Bld: 133 mg/dL — ABNORMAL HIGH (ref 70–99)
Potassium: 3.7 mmol/L (ref 3.5–5.1)
Sodium: 140 mmol/L (ref 135–145)
Total Bilirubin: 0.2 mg/dL — ABNORMAL LOW (ref 0.3–1.2)
Total Protein: 6.7 g/dL (ref 6.5–8.1)

## 2022-03-26 LAB — POC OCCULT BLOOD, ED: Fecal Occult Bld: NEGATIVE

## 2022-03-26 LAB — PROTIME-INR
INR: 1.1 (ref 0.8–1.2)
Prothrombin Time: 13.6 seconds (ref 11.4–15.2)

## 2022-03-26 LAB — RESP PANEL BY RT-PCR (RSV, FLU A&B, COVID)  RVPGX2
Influenza A by PCR: NEGATIVE
Influenza B by PCR: NEGATIVE
Resp Syncytial Virus by PCR: NEGATIVE
SARS Coronavirus 2 by RT PCR: NEGATIVE

## 2022-03-26 LAB — BRAIN NATRIURETIC PEPTIDE: B Natriuretic Peptide: 78.2 pg/mL (ref 0.0–100.0)

## 2022-03-26 MED ORDER — FOLIC ACID 1 MG PO TABS
1.0000 mg | ORAL_TABLET | Freq: Every day | ORAL | Status: DC
  Administered 2022-03-27 – 2022-03-28 (×2): 1 mg via ORAL
  Filled 2022-03-26 (×2): qty 1

## 2022-03-26 MED ORDER — SPIRONOLACTONE 25 MG PO TABS
25.0000 mg | ORAL_TABLET | Freq: Every day | ORAL | Status: DC
  Administered 2022-03-27 – 2022-03-28 (×2): 25 mg via ORAL
  Filled 2022-03-26 (×2): qty 1

## 2022-03-26 MED ORDER — DIVALPROEX SODIUM 125 MG PO CSDR
250.0000 mg | DELAYED_RELEASE_CAPSULE | Freq: Every day | ORAL | Status: DC
  Administered 2022-03-27 – 2022-03-28 (×2): 250 mg via ORAL
  Filled 2022-03-26 (×2): qty 2

## 2022-03-26 MED ORDER — ACETAMINOPHEN 650 MG RE SUPP: 650.0000 mg | Freq: Four times a day (QID) | RECTAL | Status: DC | PRN

## 2022-03-26 MED ORDER — PREDNISONE 20 MG PO TABS
40.0000 mg | ORAL_TABLET | Freq: Every day | ORAL | Status: DC
  Administered 2022-03-27 – 2022-03-28 (×2): 40 mg via ORAL
  Filled 2022-03-26 (×2): qty 2

## 2022-03-26 MED ORDER — MOMETASONE FURO-FORMOTEROL FUM 200-5 MCG/ACT IN AERO
2.0000 | INHALATION_SPRAY | Freq: Two times a day (BID) | RESPIRATORY_TRACT | Status: DC
  Administered 2022-03-27 – 2022-03-28 (×3): 2 via RESPIRATORY_TRACT
  Filled 2022-03-26: qty 8.8

## 2022-03-26 MED ORDER — METOPROLOL SUCCINATE ER 25 MG PO TB24
50.0000 mg | ORAL_TABLET | Freq: Every day | ORAL | Status: DC
  Administered 2022-03-27 – 2022-03-28 (×2): 50 mg via ORAL
  Filled 2022-03-26 (×2): qty 2

## 2022-03-26 MED ORDER — ALBUTEROL SULFATE (2.5 MG/3ML) 0.083% IN NEBU
10.0000 mg/h | INHALATION_SOLUTION | Freq: Once | RESPIRATORY_TRACT | Status: AC
  Administered 2022-03-26: 10 mg/h via RESPIRATORY_TRACT
  Filled 2022-03-26: qty 12

## 2022-03-26 MED ORDER — DABIGATRAN ETEXILATE MESYLATE 150 MG PO CAPS
150.0000 mg | ORAL_CAPSULE | Freq: Two times a day (BID) | ORAL | Status: DC
  Administered 2022-03-27: 150 mg via ORAL
  Filled 2022-03-26 (×2): qty 1

## 2022-03-26 MED ORDER — UMECLIDINIUM BROMIDE 62.5 MCG/ACT IN AEPB
1.0000 | INHALATION_SPRAY | Freq: Every day | RESPIRATORY_TRACT | Status: DC
  Administered 2022-03-27 – 2022-03-28 (×2): 1 via RESPIRATORY_TRACT
  Filled 2022-03-26: qty 7

## 2022-03-26 MED ORDER — BUDESON-GLYCOPYRROL-FORMOTEROL 160-9-4.8 MCG/ACT IN AERO: 2.0000 | INHALATION_SPRAY | Freq: Two times a day (BID) | RESPIRATORY_TRACT | Status: DC

## 2022-03-26 MED ORDER — ADULT MULTIVITAMIN W/MINERALS CH
1.0000 | ORAL_TABLET | Freq: Every day | ORAL | Status: DC
  Administered 2022-03-27 – 2022-03-28 (×2): 1 via ORAL
  Filled 2022-03-26 (×2): qty 1

## 2022-03-26 MED ORDER — NICOTINE 14 MG/24HR TD PT24
14.0000 mg | MEDICATED_PATCH | Freq: Every day | TRANSDERMAL | Status: DC
  Filled 2022-03-26 (×2): qty 1

## 2022-03-26 MED ORDER — IPRATROPIUM-ALBUTEROL 0.5-2.5 (3) MG/3ML IN SOLN: 3.0000 mL | RESPIRATORY_TRACT | Status: DC | PRN

## 2022-03-26 MED ORDER — GABAPENTIN 400 MG PO CAPS
400.0000 mg | ORAL_CAPSULE | Freq: Three times a day (TID) | ORAL | Status: DC
  Administered 2022-03-27 – 2022-03-28 (×4): 400 mg via ORAL
  Filled 2022-03-26 (×5): qty 1

## 2022-03-26 MED ORDER — THIAMINE MONONITRATE 100 MG PO TABS
100.0000 mg | ORAL_TABLET | Freq: Every day | ORAL | Status: DC
  Administered 2022-03-27 – 2022-03-28 (×2): 100 mg via ORAL
  Filled 2022-03-26 (×2): qty 1

## 2022-03-26 MED ORDER — ACETAMINOPHEN 325 MG PO TABS: 650.0000 mg | ORAL_TABLET | Freq: Four times a day (QID) | ORAL | Status: DC | PRN

## 2022-03-26 MED ORDER — SIMVASTATIN 20 MG PO TABS
20.0000 mg | ORAL_TABLET | Freq: Every day | ORAL | Status: DC
  Administered 2022-03-27 – 2022-03-28 (×2): 20 mg via ORAL
  Filled 2022-03-26 (×2): qty 1

## 2022-03-26 MED ORDER — FUROSEMIDE 20 MG PO TABS
20.0000 mg | ORAL_TABLET | Freq: Every day | ORAL | Status: DC
  Administered 2022-03-27 – 2022-03-28 (×2): 20 mg via ORAL
  Filled 2022-03-26 (×2): qty 1

## 2022-03-26 NOTE — ED Notes (Signed)
ED TO INPATIENT HANDOFF REPORT  ED Nurse Name and Phone #:  [phone] [place] RN  S Name/Age/Gender Johnny Rodriguez 72 y.o. male Room/Bed: [id number]  Code Status   Code Status: Prior  Home/SNF/Other Home Patient oriented to: self, place, time, and situation Is this baseline? Yes   Triage Complete: Triage complete  Chief Complaint Dyspnea [R06.00]  Triage Note Pt arrives via EMS from the motel he lives at. Pt reported dark tarry stool for the past 4 months. Upon ems arrival patient was very sob and lung sounds were diminished. Pt was given a duoneb, 129m of solumedrol, and 2grams of mag by ems. Pt endorses pain in bilateral shoulders and abdominal distention. Pt is on coumadin. Currently AxOx4 and [facility]    Allergies No Known Allergies  Level of Care/Admitting Diagnosis ED Disposition     ED Disposition  Admit   Condition  --   Comment  Hospital Area: [facility][[id number]]  Level of Care: Telemetry Medical [[id number]]  [facility] is [facility]:: No  Covid Evaluation: Symptomatic Person Under Investigation (PUI) or recent exposure (last 10 days) *Testing Required*  Diagnosis: Dyspnea [[id number] Admitting Physician: BLowry Ram[[id number] Attending Physician: MWendy Poet TODD D [[id number]]          B Medical/Surgery History Past Medical History:  Diagnosis Date   AF (atrial fibrillation) ([facility]    Alcohol abuse    Angina pectoris    CAD (coronary artery disease) 12/15/2004   COPD (chronic obstructive pulmonary disease) ([place]    Coronary artery disease    Heavy cigarette smoker    HTN (hypertension)    Idiopathic cardiomyopathy ([facility] 07/16/2009   Non compliance with medical treatment    Seizures ([facility]    Past Surgical History:  Procedure Laterality Date   CARDIAC CATHETERIZATION  2006   CARDIAC CATHETERIZATION  12/2004   Left    CARDIOVERSION   01/21/2011   Procedure: CARDIOVERSION;  Surgeon: WKerry Hough, MD;  [facility]  Service: Cardiovascular;  Laterality: N/A;   DOPPLER ECHOCARDIOGRAPHY  02/2011   ELECTROPHYSIOLOGY STUDY N/A 12/16/2011   Procedure: ELECTROPHYSIOLOGY STUDY;  Surgeon: GEvans Lance MD;  [facility];  Service: Cardiovascular;  Laterality: N/A;   EP IMPLANTABLE DEVICE N/A 08/24/2015   Procedure: Loop Recorder Removal;  Surgeon: GEvans Lance MD;  [facility];  Service: Cardiovascular;  Laterality: N/A;   fx collar bone     NO PAST SURGERIES       A IV Location/Drains/Wounds Patient Lines/Drains/Airways Status     Active Line/Drains/Airways     Name Placement date Placement time Site Days   Peripheral IV 03/26/22 18 G Left;Posterior Forearm 03/26/22  1753  Forearm  less than 1            Intake/Output Last 24 hours No intake or output data in the 24 hours ending 03/26/22 2038  Labs/Imaging Results for orders placed or performed during the hospital encounter of 03/26/22 (from the past 48 hour(s))  Resp panel by RT-PCR (RSV, Flu A&B, Covid) Anterior Nasal Swab     Status: None   Collection Time: 03/26/22  5:50 PM   Specimen: Anterior Nasal Swab  Result Value Ref Range   SARS Coronavirus 2 by RT PCR NEGATIVE NEGATIVE   Influenza A by PCR NEGATIVE NEGATIVE   Influenza B by PCR  NEGATIVE NEGATIVE    Comment: (NOTE) The Xpert Xpress SARS-CoV-2/FLU/RSV plus assay is intended as an aid in the diagnosis of influenza from Nasopharyngeal swab specimens and should not be used as a sole basis for treatment. Nasal washings and aspirates are unacceptable for Xpert Xpress SARS-CoV-2/FLU/RSV testing.  Fact Sheet for Patients: EntrepreneurPulse.com.au  Fact Sheet for Healthcare Providers: IncredibleEmployment.be  This test is not yet approved or cleared by the [place] FDA and has been authorized for detection and/or diagnosis  of SARS-CoV-2 by FDA under an Emergency Use Authorization (EUA). This EUA will remain in effect (meaning this test can be used) for the duration of the COVID-19 declaration under Section 564(b)(1) of the Act, 21 U.S.C. section 360bbb-3(b)(1), unless the authorization is terminated or revoked.     Resp Syncytial Virus by PCR NEGATIVE NEGATIVE    Comment: (NOTE) Fact Sheet for Patients: EntrepreneurPulse.com.au  Fact Sheet for Healthcare Providers: IncredibleEmployment.be  This test is not yet approved or cleared by the [place] FDA and has been authorized for detection and/or diagnosis of SARS-CoV-2 by FDA under an Emergency Use Authorization (EUA). This EUA will remain in effect (meaning this test can be used) for the duration of the COVID-19 declaration under Section 564(b)(1) of the Act, 21 U.S.C. section 360bbb-3(b)(1), unless the authorization is terminated or revoked.  Performed at [facility], [place] [address], [place], [place] [address]   POC occult blood, ED     Status: None   Collection Time: 03/26/22  5:50 PM  Result Value Ref Range   Fecal Occult Bld NEGATIVE NEGATIVE  CBC with Differential     Status: None   Collection Time: 03/26/22  5:54 PM  Result Value Ref Range   WBC 7.5 4.0 - 10.5 K/uL   RBC 4.39 4.22 - 5.81 MIL/uL   Hemoglobin 13.6 13.0 - 17.0 g/dL   HCT 43.3 39.0 - 52.0 %   MCV 98.6 80.0 - 100.0 fL   MCH 31.0 26.0 - 34.0 pg   MCHC 31.4 30.0 - 36.0 g/dL   RDW 12.7 11.5 - 15.5 %   Platelets 219 150 - 400 K/uL   nRBC 0.0 0.0 - 0.2 %   Neutrophils Relative % 78 %   Neutro Abs 5.8 1.7 - 7.7 K/uL   Lymphocytes Relative 15 %   Lymphs Abs 1.2 0.7 - 4.0 K/uL   Monocytes Relative 6 %   Monocytes Absolute 0.5 0.1 - 1.0 K/uL   Eosinophils Relative 1 %   Eosinophils Absolute 0.1 0.0 - 0.5 K/uL   Basophils Relative 0 %   Basophils Absolute 0.0 0.0 - 0.1 K/uL   Immature Granulocytes 0 %   Abs Immature  Granulocytes 0.02 0.00 - 0.07 K/uL    Comment: Performed at [facility], [address], [place], [place] [address]  Comprehensive metabolic panel     Status: Abnormal   Collection Time: 03/26/22  5:54 PM  Result Value Ref Range   Sodium 140 135 - 145 mmol/L   Potassium 3.7 3.5 - 5.1 mmol/L   Chloride 95 (L) 98 - 111 mmol/L   CO2 36 (H) 22 - 32 mmol/L   Glucose, Bld 133 (H) 70 - 99 mg/dL    Comment: Glucose reference range applies only to samples taken after fasting for at least 8 hours.   BUN 11 8 - 23 mg/dL   Creatinine, Ser 0.98 0.61 - 1.24 mg/dL   Calcium 9.0 8.9 - 10.3 mg/dL   Total  Protein 6.7 6.5 - 8.1 g/dL   Albumin 3.6 3.5 - 5.0 g/dL   AST 23 15 - 41 U/L   ALT 16 0 - 44 U/L   Alkaline Phosphatase 52 38 - 126 U/L   Total Bilirubin 0.2 (L) 0.3 - 1.2 mg/dL   GFR, Estimated >60 >60 mL/min    Comment: (NOTE) Calculated using the CKD-EPI Creatinine Equation (2021)    Anion gap 9 5 - 15    Comment: Performed at [facility] [address] [place], [place] [address]  Brain natriuretic peptide     Status: None   Collection Time: 03/26/22  5:54 PM  Result Value Ref Range   B Natriuretic Peptide 78.2 0.0 - 100.0 pg/mL    Comment: Performed at [facility] [address] [place], [place] [address]  Troponin I (High Sensitivity)     Status: None   Collection Time: 03/26/22  5:54 PM  Result Value Ref Range   Troponin I (High Sensitivity) 5 <18 ng/L    Comment: (NOTE) Elevated high sensitivity troponin I (hsTnI) values and significant  changes across serial measurements may suggest ACS but many other  chronic and acute conditions are known to elevate hsTnI results.  Refer to the "Links" section for chest pain algorithms and additional  guidance. Performed at [facility], [place] [address] [place], [place] [address]   Protime-INR     Status: None   Collection Time: 03/26/22  5:54 PM  Result Value Ref Range   Prothrombin Time 13.6 11.4 - 15.2 seconds    INR 1.1 0.8 - 1.2    Comment: (NOTE) INR goal varies based on device and disease states. Performed at [facility], [place] [address] [place], [place] [address]    DG Chest Portable 1 View  Result Date: 03/26/2022 CLINICAL DATA:  Shortness of breath. EXAM: PORTABLE CHEST 1 VIEW COMPARISON:  June 05, 2020. FINDINGS: The heart size and mediastinal contours are within normal limits. No acute pulmonary abnormality is noted. Hyperexpansion of the lungs is noted. The visualized skeletal structures are unremarkable. IMPRESSION: No acute cardiopulmonary abnormality seen. Electronically Signed   By: Marijo Conception M.D.   On: 03/26/2022 18:33    Pending Labs Unresulted Labs (From admission, onward)     Start     Ordered   03/26/22 1740  Lipase, blood  Once,   STAT        03/26/22 1739   03/26/22 1728  Occult blood card to lab, stool  Once,   URGENT        03/26/22 1727            Vitals/Pain Today's Vitals   03/26/22 1845 03/26/22 1900 03/26/22 1930 03/26/22 2030  BP:  (!) 117/90 116/88 119/79  Pulse: 92 (!) 105 (!) 103 (!) 114  Resp: 17 (!) 27 16 15  $ Temp:      TempSrc:      SpO2: 100% 98% 99% 94%  PainSc:        Isolation Precautions No active isolations  Medications Medications  albuterol (PROVENTIL) (2.5 MG/3ML) 0.083% nebulizer solution (10 mg/hr Nebulization Given 03/26/22 1818)    Mobility walks with device     Focused Assessments Pulmonary Assessment Handoff:  Lung sounds: Bilateral Breath Sounds: Diminished, Expiratory wheezes O2 Device: Nasal Cannula O2 Flow Rate (L/min): 4 L/min    R Recommendations: See Admitting Provider Note  Report given to:   Additional Notes:  Pt  is alert and oriented, uses urinal, on 3L [place] at baseline, on 4L [address] now, continuous neb treatment just completed. Resting comfortably. 10g IV LFA. Walks with walker.

## 2022-03-26 NOTE — H&P (Incomplete)
     Hospital Admission History and Physical Service Pager: [phone]  Patient name: Johnny Rodriguez Medical record number: [id number] Date of Birth: 07-21-1950 Age: 72 y.o. Gender: male  Primary Care Provider: Clovia Cuff, MD Consultants: None Code Status: *** which was confirmed with family if patient unable to confirm ***  Preferred Emergency Contact: ***  Chief Complaint: Shortness of breath  Assessment and Plan: Johnny Rodriguez is a 72 y.o. male presenting with dyspnea . Differential for this patient's presentation of this includes ***.  (***describe here why less likely than primary diagnosis-delete this once complete***)  No notes have been filed under this hospital service. Service: Family Medicine      FEN/GI: *** VTE Prophylaxis: ***  Disposition: ***  History of Present Illness:  Johnny Rodriguez is a 72 y.o. male presenting with dyspnea and dark stool.   Reports exertional dyspnea over the past 1 year-- has a hill at his apartment. Has to stop and rest at the top.  Using albuterol Not taking Breztri (too expensive? $30) CPAP at night  In the ED, *** Wheezing and tight    Review Of Systems: Per HPI with the following additions:  Chronic incontinence Chronic lower abdominal discomfort   Pertinent Past Medical History: *** (*** review and detail significant medical history here***) Remainder reviewed in history tab.   Pertinent Past Surgical History: ***  Remainder reviewed in history tab.   Pertinent Social History: Tobacco use: Yes, 1/2 PPD Alcohol use: one 40oz beer (initially said daily, then said only once a month or so, can't afford more) Other Substance use: marijuana, 1 joint daily Lives alone  Pertinent Family History: ***  Remainder reviewed in history tab.   Important Outpatient Medications: Coumadin???  Remainder reviewed in medication history.   Objective: BP 116/88   Pulse (!) 103   Temp 98.3 F (36.8 C) (Oral)    Resp 16   SpO2 99%  Exam: General: *** Eyes: *** ENTM: *** Neck: *** Cardiovascular: *** Respiratory: *** Gastrointestinal: *** MSK: *** Derm: *** Neuro: *** Psych: ***  Labs:  CBC BMET  Recent Labs  Lab 03/26/22 1754  WBC 7.5  HGB 13.6  HCT 43.3  PLT 219   Recent Labs  Lab 03/26/22 1754  NA 140  K 3.7  CL 95*  CO2 36*  BUN 11  CREATININE 0.98  GLUCOSE 133*  CALCIUM 9.0    Pertinent additional labs ***.  EKG: My own interpretation (not copied from electronic read) ***    Imaging Studies Performed:  Imaging Study (ie. Chest x-ray) Impression from Radiologist: ***   My Interpretation: Lowry Ram, MD 03/26/2022, 8:25 PM PGY-***, [place] Intern pager: [phone], text pages welcome Secure chat group [place]

## 2022-03-26 NOTE — ED Notes (Signed)
X-ray at bedside

## 2022-03-26 NOTE — ED Provider Notes (Signed)
[facility] Provider Note   CSN: [id number] Arrival date & time: 03/26/22  1718     History  Chief Complaint  Patient presents with   Shortness of Breath   GI Bleeding    Johnny Rodriguez is a 72 y.o. male.  Patient here with shortness of breath.  History of atrial fibrillation on Coumadin, alcohol use, COPD on 3 L of oxygen, heart failure.  He has been short of breath the last several days.  He was given magnesium, Solu-Medrol and breathing treatment with EMS as he had poor breath sounds.  He also reports has been having dark stools for the last couple months as well.  He feels like he is gaining a lot of weight in his legs and in his belly and feels distended.  Denies any nausea or vomiting.  Nothing makes it worse or better.  The history is provided by the patient.       Home Medications Prior to Admission medications   Medication Sig Start Date End Date Taking? Authorizing Provider  albuterol (PROVENTIL) (2.5 MG/3ML) 0.083% nebulizer solution Take 3 mLs (2.5 mg total) by nebulization every 4 (four) hours as needed for wheezing or shortness of breath. 10/07/21   Freddi Starr, MD  Budeson-Glycopyrrol-Formoterol (BREZTRI AEROSPHERE) 160-9-4.8 MCG/ACT AERO Inhale 2 puffs into the lungs in the morning and at bedtime. 10/07/21   Freddi Starr, MD  dabigatran (PRADAXA) 150 MG CAPS capsule Take 1 capsule (150 mg total) by mouth 2 (two) times daily. 06/07/20   Nita Sells, MD  diltiazem (CARDIZEM CD) 240 MG 24 hr capsule Take 1 capsule (240 mg total) by mouth daily. 06/07/20 06/02/21  Nita Sells, MD  divalproex (DEPAKOTE SPRINKLE) 125 MG capsule TAKE TWO CAPSULES BY MOUTH TWICE DAILY 03/06/21   Camillia Herter, NP  furosemide (LASIX) 20 MG tablet TAKE ONE TABLET (20MG) BY MOUTH EVERY DAY 06/10/21   Camillia Herter, NP  metoprolol succinate (TOPROL-XL) 50 MG 24 hr tablet Take 1 tablet (50 mg total) by mouth daily. Take  with or immediately following a meal. 06/03/21   Camillia Herter, NP  OXYGEN Inhale 3 L into the lungs continuous.     [provider]  polyethylene glycol (MIRALAX / GLYCOLAX) 17 g packet Take 17 g by mouth daily. Patient taking differently: Take 17 g by mouth daily as needed for mild constipation. 03/15/19   Marianna Payment, MD  senna (SENOKOT) 8.6 MG TABS tablet Take 1-2 tablets (8.6-17.2 mg total) by mouth daily. Patient taking differently: Take 1-2 tablets by mouth daily as needed for mild constipation. 03/15/19   Marianna Payment, MD  simvastatin (ZOCOR) 20 MG tablet Take 1 tablet (20 mg total) by mouth daily. 06/27/20   Camillia Herter, NP  spironolactone (ALDACTONE) 25 MG tablet TAKE 1 TABLET(25 MG) BY MOUTH DAILY 10/08/20   Camillia Herter, NP  traZODone (DESYREL) 50 MG tablet TAKE 1 TABLET (50MG) BY MOUTH AT BEDTIME 06/10/21   Camillia Herter, NP  VENTOLIN HFA 108 (90 Base) MCG/ACT inhaler INHALE 1-2 PUFFS INTO THE LUNGS EVERY 6 HOURS AS NEEDED FOR WHEEZING OR SHORTNESS OF BREATH 10/07/21   Freddi Starr, MD      Allergies    Patient has no known allergies.    Review of Systems   Review of Systems  Physical Exam Updated Vital Signs BP 116/88   Pulse (!) 103   Temp 98.3 F (36.8 C) (Oral)  Resp 16   SpO2 99%  Physical Exam Vitals and nursing note reviewed.  Constitutional:      General: He is not in acute distress.    Appearance: He is well-developed. He is ill-appearing.  HENT:     Head: Normocephalic and atraumatic.     Mouth/Throat:     Mouth: Mucous membranes are moist.  Eyes:     Extraocular Movements: Extraocular movements intact.     Conjunctiva/sclera: Conjunctivae normal.     Pupils: Pupils are equal, round, and reactive to light.  Cardiovascular:     Rate and Rhythm: Tachycardia present. Rhythm irregular.     Pulses: Normal pulses.     Heart sounds: Normal heart sounds. No murmur heard. Pulmonary:     Effort: Pulmonary effort is normal. Tachypnea  present. No respiratory distress.     Breath sounds: Decreased breath sounds and wheezing present.  Abdominal:     Palpations: Abdomen is soft.     Tenderness: There is no abdominal tenderness.     Comments: Abdomen is distended but nontender  Genitourinary:    Comments: Brown stool on exam Musculoskeletal:        General: No swelling.     Cervical back: Normal range of motion and neck supple.  Skin:    General: Skin is warm and dry.     Capillary Refill: Capillary refill takes less than 2 seconds.  Neurological:     General: No focal deficit present.     Mental Status: He is alert.  Psychiatric:        Mood and Affect: Mood normal.     ED Results / Procedures / Treatments   Labs (all labs ordered are listed, but only abnormal results are displayed) Labs Reviewed  COMPREHENSIVE METABOLIC PANEL - Abnormal; Notable for the following components:      Result Value   Chloride 95 (*)    CO2 36 (*)    Glucose, Bld 133 (*)    Total Bilirubin 0.2 (*)    All other components within normal limits  RESP PANEL BY RT-PCR (RSV, FLU A&B, COVID)  [id number]  CBC WITH DIFFERENTIAL/PLATELET  BRAIN NATRIURETIC PEPTIDE  PROTIME-INR  OCCULT BLOOD X 1 CARD TO LAB, STOOL  LIPASE, BLOOD  POC OCCULT BLOOD, ED  TROPONIN I (HIGH SENSITIVITY)  TROPONIN I (HIGH SENSITIVITY)    EKG EKG Interpretation  Date/Time:  Wednesday March 26 2022 17:37:27 EST Ventricular Rate:  111 PR Interval:    QRS Duration: 93 QT Interval:  351 QTC Calculation: 477 R Axis:   -88 Text Interpretation: Atrial fibrillation Inferior infarct, old Probable anterior infarct, old Confirmed by Ronnald Nian, Sasuke Yaffe ([id number]) on 03/26/2022 5:50:03 PM  Radiology DG Chest Portable 1 View  Result Date: 03/26/2022 CLINICAL DATA:  Shortness of breath. EXAM: PORTABLE CHEST 1 VIEW COMPARISON:  June 05, 2020. FINDINGS: The heart size and mediastinal contours are within normal limits. No acute pulmonary abnormality is noted. Hyperexpansion of  the lungs is noted. The visualized skeletal structures are unremarkable. IMPRESSION: No acute cardiopulmonary abnormality seen. Electronically Signed   By: Marijo Conception M.D.   On: 03/26/2022 18:33    Procedures Procedures    Medications Ordered in ED Medications  albuterol (PROVENTIL) (2.5 MG/3ML) 0.083% nebulizer solution (10 mg/hr Nebulization Given 03/26/22 1818)    ED Course/ Medical Decision Making/ A&P  Medical Decision Making Amount and/or Complexity of Data Reviewed Labs: ordered. Radiology: ordered.  Risk Prescription drug management. Decision regarding hospitalization.   Johnny Rodriguez is here with shortness of breath, dark stools.  He arrives with unremarkable vitals.  Slightly increased oxygen requirement from his baseline of 3 L to 5 L.  He has EKG with atrial fibrillation with heart rate in the 120s.  His heart rate is between 101 20 in the room.  He denies any chest pain.  He looks volume overloaded.  Some abdominal distention but no tenderness.  Pitting edema in his legs bilaterally.  He has a history of COPD, atrial fibrillation on Coumadin, alcohol abuse, heart failure.  Differential includes COPD exacerbation, volume overload from heart failure CKD or liver disease.  His stool was grossly brown on exam and seems less likely to be GI bleed/acute anemia.  Will evaluate for all these things with CBC, CMP, chest x-ray, troponin, BNP, lipase, chest x-ray, COVID and flu test.  He has wheezing and increased work of breathing on exam.  He is ready gotten steroids, breathing treatment and we will continue him on a continuous breathing treatment.  Is gotten magnesium as well.  Anticipate admission.  Overall Hemoccult negative.  Hemoglobin at baseline.  Have no concern for GI bleed.  He has no significant electrolyte abnormality or leukocytosis.  Troponin and BNP are unremarkable.  COVID and flu test are negative.  Overall my suspicion is that  patient has acute COPD exacerbation.  Chest x-ray per my review and interpretation shows no evidence of pneumonia or pneumothorax.  Patient has been steadily improving on continuous albuterol.  Will admit to medicine for further care.  This chart was dictated using voice recognition software.  Despite best efforts to proofread,  errors can occur which can change the documentation meaning.         Final Clinical Impression(s) / ED Diagnoses Final diagnoses:  COPD exacerbation ([place])  Acute respiratory failure with hypoxia [facility])    Rx / DC Orders ED Discharge Orders     None         Lennice Sites, DO 03/26/22 2026

## 2022-03-26 NOTE — ED Notes (Signed)
Pt resting comfortably at this time. Continuous neb in progress.

## 2022-03-26 NOTE — ED Notes (Signed)
Respiratory notified of neb treatment 

## 2022-03-26 NOTE — ED Triage Notes (Addendum)
Pt arrives via EMS from the motel he lives at. Pt reported dark tarry stool for the past 4 months. Upon ems arrival patient was very sob and lung sounds were diminished. Pt was given a duoneb, 149m of solumedrol, and 2grams of mag by ems. Pt endorses pain in bilateral shoulders and abdominal distention. Pt is on coumadin. Currently AxOx4 and [facility].

## 2022-03-27 ENCOUNTER — Observation Stay (HOSPITAL_COMMUNITY): Payer: 59

## 2022-03-27 ENCOUNTER — Other Ambulatory Visit (HOSPITAL_COMMUNITY): Payer: Self-pay

## 2022-03-27 ENCOUNTER — Telehealth: Payer: Self-pay | Admitting: Physician Assistant

## 2022-03-27 DIAGNOSIS — J9809 Other diseases of bronchus, not elsewhere classified: Secondary | ICD-10-CM | POA: Diagnosis present

## 2022-03-27 DIAGNOSIS — F17210 Nicotine dependence, cigarettes, uncomplicated: Secondary | ICD-10-CM | POA: Diagnosis present

## 2022-03-27 DIAGNOSIS — Z1152 Encounter for screening for COVID-19: Secondary | ICD-10-CM | POA: Diagnosis not present

## 2022-03-27 DIAGNOSIS — I2699 Other pulmonary embolism without acute cor pulmonale: Secondary | ICD-10-CM

## 2022-03-27 DIAGNOSIS — I2694 Multiple subsegmental pulmonary emboli without acute cor pulmonale: Secondary | ICD-10-CM | POA: Diagnosis not present

## 2022-03-27 DIAGNOSIS — R0602 Shortness of breath: Secondary | ICD-10-CM | POA: Diagnosis present

## 2022-03-27 DIAGNOSIS — E785 Hyperlipidemia, unspecified: Secondary | ICD-10-CM | POA: Diagnosis present

## 2022-03-27 DIAGNOSIS — Z79899 Other long term (current) drug therapy: Secondary | ICD-10-CM | POA: Diagnosis not present

## 2022-03-27 DIAGNOSIS — R000 Tachycardia, unspecified: Secondary | ICD-10-CM | POA: Diagnosis present

## 2022-03-27 DIAGNOSIS — Z9981 Dependence on supplemental oxygen: Secondary | ICD-10-CM | POA: Diagnosis not present

## 2022-03-27 DIAGNOSIS — I2510 Atherosclerotic heart disease of native coronary artery without angina pectoris: Secondary | ICD-10-CM | POA: Diagnosis present

## 2022-03-27 DIAGNOSIS — I5022 Chronic systolic (congestive) heart failure: Secondary | ICD-10-CM | POA: Diagnosis present

## 2022-03-27 DIAGNOSIS — Z7901 Long term (current) use of anticoagulants: Secondary | ICD-10-CM | POA: Diagnosis not present

## 2022-03-27 DIAGNOSIS — J439 Emphysema, unspecified: Secondary | ICD-10-CM | POA: Diagnosis present

## 2022-03-27 DIAGNOSIS — I480 Paroxysmal atrial fibrillation: Secondary | ICD-10-CM | POA: Diagnosis present

## 2022-03-27 DIAGNOSIS — Z66 Do not resuscitate: Secondary | ICD-10-CM | POA: Diagnosis present

## 2022-03-27 DIAGNOSIS — J9601 Acute respiratory failure with hypoxia: Secondary | ICD-10-CM | POA: Diagnosis present

## 2022-03-27 DIAGNOSIS — I110 Hypertensive heart disease with heart failure: Secondary | ICD-10-CM | POA: Diagnosis present

## 2022-03-27 DIAGNOSIS — J441 Chronic obstructive pulmonary disease with (acute) exacerbation: Secondary | ICD-10-CM | POA: Diagnosis present

## 2022-03-27 DIAGNOSIS — F39 Unspecified mood [affective] disorder: Secondary | ICD-10-CM | POA: Diagnosis present

## 2022-03-27 DIAGNOSIS — Z7951 Long term (current) use of inhaled steroids: Secondary | ICD-10-CM | POA: Diagnosis not present

## 2022-03-27 DIAGNOSIS — I2693 Single subsegmental pulmonary embolism without acute cor pulmonale: Secondary | ICD-10-CM | POA: Diagnosis present

## 2022-03-27 HISTORY — DX: Other pulmonary embolism without acute cor pulmonale: I26.99

## 2022-03-27 LAB — COMPREHENSIVE METABOLIC PANEL
ALT: 15 U/L (ref 0–44)
AST: 27 U/L (ref 15–41)
Albumin: 3.5 g/dL (ref 3.5–5.0)
Alkaline Phosphatase: 50 U/L (ref 38–126)
Anion gap: 14 (ref 5–15)
BUN: 14 mg/dL (ref 8–23)
CO2: 30 mmol/L (ref 22–32)
Calcium: 8.8 mg/dL — ABNORMAL LOW (ref 8.9–10.3)
Chloride: 94 mmol/L — ABNORMAL LOW (ref 98–111)
Creatinine, Ser: 1.11 mg/dL (ref 0.61–1.24)
GFR, Estimated: >60 mL/min (ref >60)
Glucose, Bld: 199 mg/dL — ABNORMAL HIGH (ref 70–99)
Potassium: 4.6 mmol/L (ref 3.5–5.1)
Sodium: 138 mmol/L (ref 135–145)
Total Bilirubin: 0.3 mg/dL (ref 0.3–1.2)
Total Protein: 6.6 g/dL (ref 6.5–8.1)

## 2022-03-27 LAB — CBC
HCT: 42.7 % (ref 39.0–52.0)
Hemoglobin: 13.4 g/dL (ref 13.0–17.0)
MCH: 31.1 pg (ref 26.0–34.0)
MCHC: 31.4 g/dL (ref 30.0–36.0)
MCV: 99.1 fL (ref 80.0–100.0)
Platelets: 205 10*3/uL (ref 150–400)
RBC: 4.31 MIL/uL (ref 4.22–5.81)
RDW: 12.6 % (ref 11.5–15.5)
WBC: 7.2 10*3/uL (ref 4.0–10.5)
nRBC: 0.3 % — ABNORMAL HIGH (ref 0.0–0.2)

## 2022-03-27 LAB — HIV ANTIBODY (ROUTINE TESTING W REFLEX): HIV Screen 4th Generation wRfx: NONREACTIVE

## 2022-03-27 MED ORDER — APIXABAN 5 MG PO TABS: 5.0000 mg | ORAL_TABLET | Freq: Two times a day (BID) | ORAL | Status: DC

## 2022-03-27 MED ORDER — IOHEXOL 350 MG/ML SOLN
70.0000 mL | Freq: Once | INTRAVENOUS | Status: AC | PRN
  Administered 2022-03-27: 70 mL via INTRAVENOUS

## 2022-03-27 MED ORDER — FOLIC ACID 1 MG PO TABS
1.0000 mg | ORAL_TABLET | Freq: Every day | ORAL | 0 refills | Status: DC
  Filled 2022-03-27: qty 30, 30d supply, fill #0

## 2022-03-27 MED ORDER — APIXABAN 5 MG PO TABS
10.0000 mg | ORAL_TABLET | Freq: Two times a day (BID) | ORAL | Status: DC
  Administered 2022-03-27 – 2022-03-28 (×2): 10 mg via ORAL
  Filled 2022-03-27 (×2): qty 2

## 2022-03-27 MED ORDER — AZITHROMYCIN 250 MG PO TABS
500.0000 mg | ORAL_TABLET | Freq: Every day | ORAL | Status: DC
  Administered 2022-03-27 – 2022-03-28 (×2): 500 mg via ORAL
  Filled 2022-03-27 (×2): qty 2

## 2022-03-27 MED ORDER — PREDNISONE 20 MG PO TABS
40.0000 mg | ORAL_TABLET | Freq: Every day | ORAL | 0 refills | Status: AC
  Filled 2022-03-27: qty 8, 4d supply, fill #0

## 2022-03-27 MED ORDER — NICOTINE 14 MG/24HR TD PT24
14.0000 mg | MEDICATED_PATCH | Freq: Every day | TRANSDERMAL | 0 refills | Status: AC
  Filled 2022-03-27: qty 28, 28d supply, fill #0

## 2022-03-27 NOTE — Plan of Care (Signed)
  Problem: Activity: Goal: Ability to tolerate increased activity will improve Outcome: Not Progressing   Problem: Respiratory: Goal: Ability to maintain a clear airway will improve Outcome: Progressing   Problem: Respiratory: Goal: Levels of oxygenation will improve Outcome: Progressing   Problem: Clinical Measurements: Goal: Will remain free from infection Outcome: Progressing   Problem: Clinical Measurements: Goal: Respiratory complications will improve Outcome: Progressing

## 2022-03-27 NOTE — Evaluation (Signed)
Occupational Therapy Evaluation Patient Details Name: Johnny Rodriguez MRN: [id number] DOB: 22-Jun-1950 Today's Date: 03/27/2022   History of Present Illness Pt is a 72 y.o. male presenting to [facility] 03/26/2022 with SOB over the past several days and tarry stool over past several months. PMH significant for a-fib on coumadin, alcohol use, COPD on 3L oxygen, heart failure.   Clinical Impression   PTA, pt lived alone and had a PCA 3 hours per day 7 days per week. Upon eval, suspect pt performing near his personal baseline. Mild memory and problem solving difficulty, but able to provide meaningful description of how he typically manages his finances, keeps up with appointments, etc. Pt with decreased standing balance without support, but performs bimanual tasks in a seated position and with good safety awareness overall. Pt additionally uses a reacher to pick up items off of the floor rather than bending down to the floor. Feel pt is at functional baseline, so recommending no OT follow up and return home with help from PCA.      Recommendations for follow up therapy are one component of a multi-disciplinary discharge planning process, led by the attending physician.  Recommendations may be updated based on patient status, additional functional criteria and insurance authorization.   Follow Up Recommendations  No OT follow up     Assistance Recommended at Discharge Intermittent Supervision/Assistance  Patient can return home with the following A little help with bathing/dressing/bathroom;Assistance with cooking/housework;Assist for transportation;Help with stairs or ramp for entrance;Direct supervision/assist for financial management;Direct supervision/assist for medications management    Functional Status Assessment  Patient has had a recent decline in their functional status and demonstrates the ability to make significant improvements in function in a reasonable and predictable amount of time.   Equipment Recommendations  None recommended by OT    Recommendations for Other Services       Precautions / Restrictions Precautions Precautions: None Restrictions Weight Bearing Restrictions: No      Mobility Bed Mobility               General bed mobility comments: in recliner on arrival and depature    Transfers Overall transfer level: Needs assistance Equipment used: Rollator (4 wheels) Transfers: Sit to/from Stand Sit to Stand: Supervision           General transfer comment: for safety      Balance Overall balance assessment: Mild deficits observed, not formally tested                                         ADL either performed or assessed with clinical judgement   ADL Overall ADL's : Needs assistance/impaired Eating/Feeding: Independent;Sitting Eating/Feeding Details (indicate cue type and reason): eating on arrival Grooming: Set up;Sitting Grooming Details (indicate cue type and reason): Pt reports he always sits to groom Upper Body Bathing: Set up;Sitting   Lower Body Bathing: Sitting/lateral leans;Supervison/ safety   Upper Body Dressing : Set up   Lower Body Dressing: Sit to/from stand;Supervision/safety Lower Body Dressing Details (indicate cue type and reason): Pt educating how he typically dons pants/underpants, and observed to be mildly unsteady, but reporting aide usually helps. Encouraged pt to continue letting aid help, or to maintain contact with rollaort or sturdy counter during all pulling up/down pants for dressing or toileting Toilet Transfer: Supervision/safety;Rollator (4 wheels);Ambulation   Toileting- Clothing Manipulation and Hygiene: Set up;Sitting/lateral lean  Tub/Shower Transfer Details (indicate cue type and reason): aide helps at home Functional mobility during ADLs: Supervision/safety;Rollator (4 wheels) General ADL Comments: believe pt is at his functional baseline     Vision Baseline  Vision/History: 0 No visual deficits (reading glasses) Ability to See in Adequate Light: 0 Adequate Patient Visual Report: No change from baseline Vision Assessment?: No apparent visual deficits Additional Comments: reading glasses and magnifier present in room     Perception Perception Perception Tested?: No   Praxis Praxis Praxis tested?: Not tested    Pertinent Vitals/Pain Pain Assessment Pain Assessment: No/denies pain     Hand Dominance Right   Extremity/Trunk Assessment Upper Extremity Assessment Upper Extremity Assessment: Defer to OT evaluation   Lower Extremity Assessment Lower Extremity Assessment: Defer to PT evaluation   Cervical / Trunk Assessment Cervical / Trunk Assessment: Normal   Communication Communication Communication: No difficulties   Cognition Arousal/Alertness: Awake/alert Behavior During Therapy: WFL for tasks assessed/performed Overall Cognitive Status: Within Functional Limits for tasks assessed                                 General Comments: Pt with min difficulty with delayed memory recall. one error while counting backward from 20 down to one. Potentially some mild cogntiive deficits, but able to recall/reoport how he typically performs ADL, IADL, financial management, medication management, and has an aide who comes out to assist. Believe pt is near baseline.     General Comments       Exercises     Shoulder Instructions      Home Living Family/patient expects to be discharged to:: Private residence Living Arrangements: Alone Available Help at Discharge: Personal care attendant (aide 7x/week 3 hours a day) Type of Home: Other(Comment) ([facility]) Home Access: Level entry     Home Layout: One level     Bathroom Shower/Tub: Teacher, early years/pre: [place]: Rollator (4 wheels);Shower seat;Hand held shower head;Cane - single point          Prior  Functioning/Environment Prior Level of Function : Independent/Modified Independent             Mobility Comments: uses rollator ADLs Comments: Pt states that a caretaker comes to help with dressing, bathing and changing his brief. They assist with cooking and cleaning.        OT Problem List: Decreased strength;Decreased activity tolerance;Impaired balance (sitting and/or standing);Decreased cognition;Decreased safety awareness      OT Treatment/Interventions:      OT Goals(Current goals can be found in the care plan section) Acute Rehab OT Goals Patient Stated Goal: go home for birthday OT Goal Formulation: With patient Time For Goal Achievement: 04/10/22 Potential to Achieve Goals: Good  OT Frequency:      Co-evaluation              AM-PAC OT "6 Clicks" Daily Activity     Outcome Measure Help from another person eating meals?: None Help from another person taking care of personal grooming?: A Little Help from another person toileting, which includes using toliet, bedpan, or urinal?: A Little Help from another person bathing (including washing, rinsing, drying)?: A Little Help from another person to put on and taking off regular upper body clothing?: A Little Help from another person to put on and taking off regular lower body clothing?: A Little 6 Click Score: 19   End  of Session Equipment Utilized During Treatment: Gait belt;Rolling walker (2 wheels) Nurse Communication: Mobility status  Activity Tolerance: Patient tolerated treatment well Patient left: with call bell/phone within reach;in chair;with chair alarm set  OT Visit Diagnosis: Unsteadiness on feet (R26.81);Muscle weakness (generalized) (M62.81);Other abnormalities of gait and mobility (R26.89);Other symptoms and signs involving cognitive function                Time: [id number] OT Time Calculation (min): 27 min Charges:  OT General Charges $OT Visit: 1 Visit OT Evaluation $OT Eval Low Complexity: 1  Low OT Treatments $Self Care/Home Management : 8-22 mins  Elder Cyphers, OTR/L [facility] Acute Rehabilitation Office: [phone]   Magnus Ivan 03/27/2022, 3:55 PM

## 2022-03-27 NOTE — Progress Notes (Signed)
     Daily Progress Note Intern Pager: [phone]  Patient name: Adyn Serna Medical record number: [id number] Date of birth: [birth year] Age: 72 y.o. Gender: male  Primary Care Provider: Clovia Cuff, MD Consultants: None Code Status: DNR  Pt Overview and Major Events to Date:  2/14 Admitted  Assessment and Plan: JD is a 72yo M admitted for dyspnea 2/2 COPD in setting of running out of home [address]. Also working up for potential PE.  PMHx of COPD, PAF, HFmrEF, CAD, HTN  * Dyspnea secondary to COPD Now back on 3L [place] (baseline). Mild end-exp wheezing throughout on exam, but appears improved from exam on admission. Reassuringly, no increased mucus production so lower concern for a true COPD exacerbation, more likely chronic poor control of COPD. Will finish 5 day course of prednisone, but do not feel that antibiotics are warranted at this time. Also has a Wells Score of 9, so will get CTA to work-up potential PE. He should be on home dabigatran, but unsure if pt is consistently taking. - f/u CTA for PE - cont home Breztri. Pharmacy provided medication assistance form for [facility].  - Cont prn duonebs. (Plan to cont prn albuterol at time of discharge) - Prednisone 40 mg daily for 5 days  - nicotine patch. Pt is interested in smoking cessation. Reports nicotine patches previously made him feel sick. Willing to retry on an empty stomach and for shorter period of time    FEN/GI: Heart healthy PPx: home Dabigatran Dispo:Home today. Barriers include workup for PE and weaning O2 requirements. .   Subjective:  Reports that his breathing is better and he is very eager to leave. Denies chest pain.   Objective: Temp:  [97.8 F (36.6 C)-98.3 F (36.8 C)] 98.2 F (36.8 C) (02/15 0830) Pulse Rate:  [92-134] 107 (02/15 0830) Resp:  [15-29] 16 (02/15 0830) BP: (110-138)/(63-99) 138/99 (02/15 0830) SpO2:  [89 %-100 %] 94 % (02/15 0918) Weight:  [83.4 kg] 83.4 kg (02/14 2215) Physical  Exam: General: Sitting up in chair. NAD. On 3L [place]. Speaking in full sentences. Cardiovascular: RRR Respiratory: Diffuse mild end-expiratory wheezing in all fields. Normal WOB on 3L [place] Abdomen: Soft, nontender, nondistended. Normal BS  Laboratory: Most recent CBC Lab Results  Component Value Date   WBC 7.2 03/27/2022   HGB 13.4 03/27/2022   HCT 42.7 03/27/2022   MCV 99.1 03/27/2022   PLT 205 03/27/2022   Most recent BMP    Latest Ref Rng & Units 03/27/2022    3:50 AM  BMP  Glucose 70 - 99 mg/dL 199   BUN 8 - 23 mg/dL 14   Creatinine 0.61 - 1.24 mg/dL 1.11   Sodium 135 - 145 mmol/L 138   Potassium 3.5 - 5.1 mmol/L 4.6   Chloride 98 - 111 mmol/L 94   CO2 22 - 32 mmol/L 30   Calcium 8.9 - 10.3 mg/dL 8.8     Arlyce Dice, MD 03/27/2022, 12:06 PM  PGY-1, [place] Intern pager: [phone], text pages welcome Secure chat group [place]

## 2022-03-27 NOTE — Progress Notes (Signed)
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 Had CTA which was notable for segmental and subsegmental PE's along with known emphysema.  Starting DOAC today and pt to be discharged today or tomorrow 2/16. He is on his baseline home O2 settings, vitals stable, no distress.  Discussed with Dr. Vaughan Browner. No need for holding him inpatient and no pressing interventions required for bronchomalacia. I have sent a message to our office to please arrange for outpatient follow up in our office. I also relayed this to primary service.   Montey Hora, [facility] For pager details, please see AMION or use Epic chat  After 1900, please call [facility] for cross coverage needs 03/27/2022, 6:04 PM

## 2022-03-27 NOTE — Progress Notes (Signed)
Patient transfer from [place] and oriented,able to make needs known,no c/o of pain,patient assessment done,make comfortable in room, snack given,will continue to monitor.

## 2022-03-27 NOTE — Assessment & Plan Note (Addendum)
On 4L oxygen (baseline 3L). Respiratory compromise likely secondary to PE but poorly controlled COPD likely contributing to clinical state. Per pharmacy, $0 copay for [facility]. Patient refused nicotine patch, continue to encourage smoking cessation. - Cont home Breztri - Cont prn duonebs - Prednisone 40 mg daily x 5 days, Azithromycin x 3 days - Trial nicotine patch, refused

## 2022-03-27 NOTE — Discharge Instructions (Addendum)
Dear Johnny Rodriguez,   Thank you so much for allowing [place] to be part of your care!  You were admitted to [facility] for a COPD exacerbation but were also found to have a pulmonary embolism (clot in you lungs)   POST-HOSPITAL & CARE INSTRUCTIONS Continue to take your blood thinner (eliquis) as prescribed Continue to take steroids and antibiotics as prescribed Please let PCP/Specialists know of any changes that were made.  Please see medications section of this packet for any medication changes.   DOCTOR'S APPOINTMENT   No future appointments.  Follow-up Information     Clovia Cuff, MD Follow up.   Specialty: Internal Medicine Contact information: [address] [place] [address] [phone]                 Take care and be well!  [facility]  [place], [place] [address] [phone]  RETURN PRECAUTIONS: Shortness of breath, increased oxygen requirement  Take care and be well!   ______________________________ Information on my medicine - ELIQUIS (apixaban)  This medication education was reviewed with me or my healthcare representative as part of my discharge preparation.    Why was Eliquis prescribed for you? Eliquis was prescribed to treat blood clots that may have been found in the veins of your legs (deep vein thrombosis) or in your lungs (pulmonary embolism) and to reduce the risk of them occurring again.  What do You need to know about Eliquis ? The starting dose is 10 mg (two 5 mg tablets) taken TWICE daily for the FIRST SEVEN (7) DAYS, then on (enter date)  04/03/2022  the dose is reduced to ONE 5 mg tablet taken TWICE daily.  Eliquis may be taken with or without food.   Try to take the dose about the same time in the morning and in the evening. If you have difficulty swallowing the tablet whole please discuss with your pharmacist how to  take the medication safely.  Take Eliquis exactly as prescribed and DO NOT stop taking Eliquis without talking to the doctor who prescribed the medication.  Stopping may increase your risk of developing a new blood clot.  Refill your prescription before you run out.  After discharge, you should have regular check-up appointments with your healthcare provider that is prescribing your Eliquis.    What do you do if you miss a dose? If a dose of ELIQUIS is not taken at the scheduled time, take it as soon as possible on the same day and twice-daily administration should be resumed. The dose should not be doubled to make up for a missed dose.  Important Safety Information A possible side effect of Eliquis is bleeding. You should call your healthcare provider right away if you experience any of the following: Bleeding from an injury or your nose that does not stop. Unusual colored urine (red or dark brown) or unusual colored stools (red or black). Unusual bruising for unknown reasons. A serious fall or if you hit your head (even if there is no bleeding).  Some medicines may interact with Eliquis and might increase your risk of bleeding or clotting while on Eliquis. To help avoid this, consult your healthcare provider or pharmacist prior to using any new prescription or non-prescription medications, including herbals, vitamins, non-steroidal anti-inflammatory drugs (NSAIDs) and supplements.  This website has more information on Eliquis (apixaban): http://www.eliquis.com/eliquis/home

## 2022-03-27 NOTE — Progress Notes (Signed)
Assisted patient in completing Judithann Sauger copay assistance over the phone [phone]) as patient does not have an active email address or phone capable of text messages.  Patient has Group and BIN number information to provide outpatient pharmacy for filling of prescription.  [employer], Pharm.D., BCPS Clinical Pharmacist Clinical phone for 03/27/2022 from 7:30-3:00 is [phone].  **Pharmacist phone directory can be found on [email or web address] listed under [place].  03/27/2022 1:13 PM

## 2022-03-27 NOTE — Hospital Course (Addendum)
Johnny Rodriguez is a 72yo M w/ hx of COPD (3L [place] baseline), PAF, HFmrEF, CAD, HTN admitted for dyspnea with wheezing in the setting of likely COPD exacerbation and found to having underlying PE.  COPD exacerbation PE Admitted for increased O2 requirement (8L [place], baseline 3L [place]), dyspnea and diffuse wheezing. Reports not taking his home Breztri, treated as likely COPD exacerbation with Duonebs, Prednisone, and Dulera & Incruse inhaler. Persistently tachycardic with Wells score of 9, obtained CTA PE. Imaging showed evidence of PE, started on Eliquis. He was weaned back to baseline O2 and discharged home on Breztri (no copay), albuterol prn, prednisone (5 day course) and Azithromycin (3 day course).   PCP Follow-Up 1) Reports melena prior to admission. Hgb stable. Needs outpatient colonoscopy.  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

## 2022-03-27 NOTE — H&P (Addendum)
Hospital Admission History and Physical Service Pager: [phone]  Patient name: Johnny Rodriguez Medical record number: [id number] Date of Birth: 03-26-1950 Age: 72 y.o. Gender: male  Primary Care Provider: Clovia Cuff, MD Consultants: None Code Status: DNR Preferred Emergency Contact:  Contact Information     Name Relation Home Work [place] Other   [phone]   Services,Psychotherapeutic Other [phone]         Chief Complaint: Shortness of breath  Assessment and Plan: Grant Woodrum is a 72 y.o. male presenting with dyspnea. Differential for this patient's presentation of this includes COPD exacerbation, CHF exacerbation, atrial fibrillation in RVR.  Unlikely due to afib as patient had controlled heart rate before albuterol treatments. Unlikely due to CHF as patient was not clinically volume overloaded and BNP was only 78.2. Dyspnea is most likely due to COPD as patient has not been taking prescribed controller due to financial constraints and patient is very wheezy on exam and requiring more oxygen, improving with albuterol.   * Dyspnea secondary to COPD Most likely due to COPD due to not being able to afford controller medication. However, patient does have tachycardia with increased oxygen requirement and has unclear history of taking his anticoagulation. Unlikely PE as heart rate was increased after getting albuterol treatment, but could consider CTA.  - Admit to FPTS, Attending Dr. McDiarmid  - O2 support as needed with goal saturation 88-92%  - Continue formulary alternative for Breztri  - Duonebs q 4 hours as needed  - Prednisone 40 mg daily for 5 days  - TOC for medication assistance, tobacco cessation  - nicotine patch  - PT and OT eval and treat  - AM CBC, BMP - daily weights  - strict intake and output - Consider CTA for PE if patient continues to have tachycardia and worsening hypoxia.      Chronic Conditions  PAF  - continue pradaxa  CHF - continue metoprolol, lasix  Mood disorder - continue depakote  HLD - continue simvastatin    FEN/GI: Heart healthy  VTE Prophylaxis: On pradaxa   Disposition: Med surg   History of Present Illness:  Johnny Rodriguez is a 72 y.o. male presenting with dyspnea and dark stool.   Reports exertional dyspnea over the past 1 year-- has a hill at his apartment. Has to stop and rest at the top.  Patient says that he has financial constraints; thus, does not use his Judithann Sauger and uses albuterol multiple times a day for the past few months. Denies recent URI symptoms. Denies increase in sputum quality or quantity. Patient does endorse using CPAP at night. Denies recent fevers or chills. Does say that he feels like he has been losing weight for the past few months. Does not know how much weight in how long. Denies having had colonoscopy. Denies blood in stool.   In the ED, patient was wheezing and tight on lung exam. Patient was put on 6 L oxygen from 3 L at baseline and given continuous albuterol.  CXR was negative for any active cardiopulmonary disease. Labs were not remarkable. BNP was 78.   Review Of Systems: Per HPI with the following additions:  Chronic incontinence Chronic lower abdominal discomfort   Pertinent Past Medical History: COPD  PAF  HFmrEF CAD HTN   Remainder reviewed in history tab.   Pertinent Past Surgical History: Remainder reviewed in history tab.   Pertinent Social History: Tobacco use: Yes, 1/2 PPD Alcohol use: one  40oz beer (initially said daily, then said only once a month or so, can't afford more) Other Substance use: marijuana, 1 joint daily Lives alone  Pertinent Family History: Remainder reviewed in history tab.   Important Outpatient Medications: Pradaxa  Albuterol  Cardizem  Metoprolol   Remainder reviewed in medication history.   Objective: BP 120/78 (BP Location: Right Arm)   Pulse 99   Temp 98.1 F (36.7 C)  (Oral)   Resp (!) 21   Wt 83.4 kg   SpO2 96%   BMI 26.38 kg/m  Exam: General: Chronically ill appearing, laying in bed  Eyes: PERRLA  ENTM: Moist mucous membranes Cardiovascular: tachycardia, regular rhythm, No m/r/g, cap refill < 2 seconds Respiratory: Diffuse wheezing on inspiration and expiration Gastrointestinal: Protuberant, non distended, soft, non tender  Neuro: Alert and oriented x 4   Labs:  CBC BMET  Recent Labs  Lab 03/26/22 1754  WBC 7.5  HGB 13.6  HCT 43.3  PLT 219   Recent Labs  Lab 03/26/22 1754  NA 140  K 3.7  CL 95*  CO2 36*  BUN 11  CREATININE 0.98  GLUCOSE 133*  CALCIUM 9.0    Pertinent additional labs BNP 78.  EKG: atrial fibrillation, rate 103, qtc 477   Imaging Studies Performed:  CXR: no acute cardiopulmonary disease Cardiomegaly, possible mild right sided pleural effusion    Lowry Ram, MD 03/27/2022, 2:02 AM PGY-1, [place] Intern pager: [phone], text pages welcome Secure chat group [place]

## 2022-03-27 NOTE — TOC Initial Note (Signed)
Transition of Care (TOC) - Initial/Assessment Note   Patient from home alone. Has PCP Clovia Cuff   Patient uses Medicaid transportation to go to appointments.   Consult for medication assistance . Discussed with patient , patient states he is able to get all prescriptions filled. Patient does have prescription coverage. NCM not able to assist with co pays. Can change pharmacy to [facility] .   Patient believes he is being discharged today and asking for cab voucher, will place on chart and let nurse know.   Patient has two rolators . He would like his older one serviced and states his newer one ( received from [employer] a month ago) is not tall enough. Erasmo Downer with [employer] will call patient directly.   Patient has home oxygen with [place] and has portable concentrator at bedside.  Patient Details  Name: Johnny Rodriguez MRN: [id number] Date of Birth: [birth year]  Transition of Care [facility]) CM/SW Contact:    Marilu Favre, RN Phone Number: 03/27/2022, 12:29 PM  Clinical Narrative:                   Expected Discharge Plan: Home/Self Care Barriers to Discharge: Continued Medical Work up   Patient Goals and CMS Choice Patient states their goals for this hospitalization and ongoing recovery are:: to return to home          Expected Discharge Plan and Services                           DME Agency: [employer]                  Prior Living Arrangements/Services   Lives with:: Self Patient language and need for interpreter reviewed:: Yes Do you feel safe going back to the place where you live?: Yes          Current home services: DME Criminal Activity/Legal Involvement Pertinent to Current Situation/Hospitalization: No - Comment as needed  Activities of Daily Living Home Assistive Devices/Equipment: CPAP, Walker (specify type) ADL Screening (condition at time of admission) Patient's cognitive ability adequate to safely complete daily activities?:  Yes Patient able to express need for assistance with ADLs?: Yes Does the patient have difficulty dressing or bathing?: No Independently performs ADLs?: Yes (appropriate for developmental age) Does the patient have difficulty walking or climbing stairs?: Yes Weakness of Legs: Both Weakness of Arms/Hands: None  Permission Sought/Granted   Permission granted to share information with : No              Emotional Assessment Appearance:: Appears stated age Attitude/Demeanor/Rapport: Engaged Affect (typically observed): Accepting Orientation: : Oriented to Self, Oriented to Place, Oriented to  Time, Oriented to Situation Alcohol / Substance Use: Not Applicable Psych Involvement: No (comment)  Admission diagnosis:  Dyspnea [R06.00] COPD exacerbation (HCC) [J44.1] Acute respiratory failure with hypoxia ([place]) [J96.01] Patient Active Problem List   Diagnosis Date Noted   Dyspnea secondary to COPD 03/26/2022   Hypocalcemia    Hypokalemia    Atrial fibrillation with RVR ([place]) 06/05/2020   COPD with acute exacerbation ([place]) 03/11/2019   AKI (acute kidney injury) ([place]) 03/10/2019   Bowel obstruction ([place]) 03/10/2019   Chronic respiratory failure with hypoxia ([place]) 10/15/2016   Cigarette smoker 09/09/2016   Long term current use of anticoagulant therapy 06/24/2016   History of syncope 06/24/2016   COPD exacerbation ([place]) 02/08/2015   Essential hypertension 02/08/2015   Syncope 12/10/2011  History of noncompliance with medical treatment [id number]   Chronic systolic heart failure ([place]) 04/10/2011   Idiopathic cardiomyopathy ([place]) 07/16/2009   Paroxysmal atrial fibrillation ([place]) 07/16/2009   COPD PFT's pending  07/16/2009   CAD (coronary artery disease) 12/15/2004   PCP:  Clovia Cuff, MD Pharmacy:   [place], [place] - [address] [place] [address] Phone: [phone] Fax: [phone]  [facility] [address] [place] [address] Phone: [phone] Fax: [place] - [facility] [place], [place] - [place] [address] [place] [address] Phone: [phone] Fax: [phone]  [facility] [phone] - [place], [place] - [address] [place] [address] Phone: [phone] Fax: [phone]  [facility] [place], [place] - [address] [place] [address] Phone: [phone] Fax: [phone]     Social Determinants of Health (SDOH) Social History: [place]: No Food Insecurity (09/11/2020)  Transportation Needs: Unmet Transportation Needs (10/03/2021)  Alcohol Screen: Low Risk  (09/11/2020)  Recent Concern: Alcohol Screen - Medium Risk (08/07/2020)  Depression (PHQ2-9): Low Risk  (01/25/2021)  Financial Resource Strain: Low Risk  (09/11/2020)  Physical Activity: Inactive (09/11/2020)  Social Connections: Socially Isolated (09/11/2020)  Stress: Stress Concern Present (09/11/2020)  Tobacco Use: High Risk (03/26/2022)   SDOH Interventions:     Readmission Risk Interventions     No data to display

## 2022-03-27 NOTE — Evaluation (Signed)
Physical Therapy Evaluation Patient Details Name: Johnny Rodriguez MRN: [id number] DOB: 09/24/1950 Today's Date: 03/27/2022  History of Present Illness  Pt is a 72 y.o. male presenting to [facility]/26/2022 with SOB over the past several days and tarry stool over past several months. PMH significant for a-fib on coumadin, alcohol use, COPD on 3L oxygen, heart failure.  Clinical Impression  Pt is presenting at baseline level of function. Pt is limited by respiratory status currently. Pt is modified independent for transfers and gait with rollator. Pt does need a new rollator due to the back L wheel on current rollator is flat/falling apart. Pt states that he has an aide with him 7 days/week for 3 hours but initially stated he had no help. Pt may have some cognitive deficits that are not apparent with typical orientation questions. Due to pt current functional status/PLOF and available assistance currently no recommended skilled physical therapy services on discharge from acute care hospital setting. If further needs arise please re-consult.        Recommendations for follow up therapy are one component of a multi-disciplinary discharge planning process, led by the attending physician.  Recommendations may be updated based on patient status, additional functional criteria and insurance authorization.  Follow Up Recommendations No PT follow up      Assistance Recommended at Discharge PRN  Patient can return home with the following  Assistance with cooking/housework;Help with stairs or ramp for entrance    Equipment Recommendations Rollator (4 wheels)  Recommendations for Other Services       Functional Status Assessment Patient has not had a recent decline in their functional status     Precautions / Restrictions Precautions Precautions: None Restrictions Weight Bearing Restrictions: No      Mobility  Bed Mobility               General bed mobility comments: Pt received in  recliner and return to recliner on request. Patient Response: Cooperative  Transfers Overall transfer level: Modified independent Equipment used: Rollator (4 wheels)               General transfer comment: No overt LOB or heavy reliance on AD.    Ambulation/Gait Ambulation/Gait assistance: Modified independent (Device/Increase time) Gait Distance (Feet): 200 Feet (then 150 ft) Assistive device: Rollator (4 wheels) Gait Pattern/deviations: Step-through pattern, Decreased stride length   Gait velocity interpretation: 1.31 - 2.62 ft/sec, indicative of limited community ambulator   General Gait Details: Pt is limited by respiratory status. On 4L O2 during gait pt took a break after 200 ft for ~2 min recovery of purse lipped breathing.  Stairs Stairs:  (not applicable pt does not have stairs.)              Balance Overall balance assessment: No apparent balance deficits (not formally assessed)         Pertinent Vitals/Pain Pain Assessment Pain Assessment: No/denies pain    Home Living Family/patient expects to be discharged to:: Private residence Living Arrangements: Alone Available Help at Discharge: Personal care attendant (aide 7x/week about 3 hours) Type of Home: Other(Comment) ([employer]) Home Access: Level entry       Home Layout: One level Home Equipment: Rollator (4 wheels);Shower seat;Hand held Veterinary surgeon - single point      Prior Function Prior Level of Function : Independent/Modified Independent       ADLs Comments: Pt states that a caretaker comes to help with dressing, bathing and changing his brief. They assist  with cooking and cleaning.     Hand Dominance   Dominant Hand: Right    Extremity/Trunk Assessment   Upper Extremity Assessment Upper Extremity Assessment: Defer to OT evaluation    Lower Extremity Assessment Lower Extremity Assessment: Overall WFL for tasks assessed    Cervical / Trunk Assessment Cervical  / Trunk Assessment: Normal  Communication   Communication: No difficulties  Cognition Arousal/Alertness: Awake/alert Behavior During Therapy: WFL for tasks assessed/performed Overall Cognitive Status: Within Functional Limits for tasks assessed         General Comments: possible cognitive deficits; difficult to assess. Pt continues to add/take away information from home set up        General Comments General comments (skin integrity, edema, etc.): Pt needs AD for respiratory status. No significant balance deficits noted during activities. No overt LOB. Pt has some safety issues not remembering to lock rollator for transfers or sitting on rollator to rest.        Assessment/Plan    PT Assessment Patient does not need any further PT services  PT Problem List         PT Treatment Interventions      PT Goals (Current goals can be found in the Care Plan section)  Acute Rehab PT Goals PT Goal Formulation: All assessment and education complete, DC therapy    Frequency          AM-PAC PT "6 Clicks" Mobility  Outcome Measure Help needed turning from your back to your side while in a flat bed without using bedrails?: None Help needed moving from lying on your back to sitting on the side of a flat bed without using bedrails?: None Help needed moving to and from a bed to a chair (including a wheelchair)?: None Help needed standing up from a chair using your arms (e.g., wheelchair or bedside chair)?: None Help needed to walk in hospital room?: None Help needed climbing 3-5 steps with a railing? : A Little 6 Click Score: 23    End of Session Equipment Utilized During Treatment: Gait belt Activity Tolerance: Patient tolerated treatment well Patient left: in chair;with call bell/phone within reach Nurse Communication: Mobility status      Time: [id number] PT Time Calculation (min) (ACUTE ONLY): 25 min   Charges:   PT Evaluation $PT Eval Low Complexity: 1 Low PT  Treatments $Therapeutic Activity: 8-22 mins        Tomma Rakers, DPT, [place] Office: [phone] (Secure chat preferred)   Ander Purpura 03/27/2022, 11:18 AM

## 2022-03-28 ENCOUNTER — Other Ambulatory Visit (HOSPITAL_COMMUNITY): Payer: Self-pay

## 2022-03-28 DIAGNOSIS — J9809 Other diseases of bronchus, not elsewhere classified: Secondary | ICD-10-CM

## 2022-03-28 LAB — CBC
HCT: 40.4 % (ref 39.0–52.0)
Hemoglobin: 13.3 g/dL (ref 13.0–17.0)
MCH: 31.8 pg (ref 26.0–34.0)
MCHC: 32.9 g/dL (ref 30.0–36.0)
MCV: 96.7 fL (ref 80.0–100.0)
Platelets: 211 10*3/uL (ref 150–400)
RBC: 4.18 MIL/uL — ABNORMAL LOW (ref 4.22–5.81)
RDW: 12.9 % (ref 11.5–15.5)
WBC: 13.6 10*3/uL — ABNORMAL HIGH (ref 4.0–10.5)
nRBC: 0 % (ref 0.0–0.2)

## 2022-03-28 MED ORDER — AZITHROMYCIN 500 MG PO TABS
ORAL_TABLET | ORAL | 0 refills | Status: DC
  Filled 2022-03-28: qty 1, 1d supply, fill #0

## 2022-03-28 MED ORDER — APIXABAN (ELIQUIS) VTE STARTER PACK (10MG AND 5MG)
ORAL_TABLET | ORAL | 0 refills | Status: DC
  Filled 2022-03-28: qty 74, 30d supply, fill #0

## 2022-03-28 NOTE — Telephone Encounter (Signed)
Patient is scheduled 04/07/2022 at 230pm with Dr. Erin Fulling as he is an established patient.

## 2022-03-28 NOTE — TOC Benefit Eligibility Note (Addendum)
Patient Teacher, English as a foreign language completed.    The patient is currently admitted and upon discharge could be taking Trelegy Elipta 100-62.5-25 mcg/act .  The current 30 day co-pay is $0.00.   The patient is currently admitted and upon discharge could be taking Breztri 160-9-4.8 mcg/act .  The current 30 day co-pay is $0.00.   The patient is currently admitted and upon discharge could be taking Eliquis 5 mg.  The current 30 day co-pay is $0.00.   The patient is insured through [place] Patient Advocate Specialist [place] Patient Advocate Team Direct Number: [phone]  Fax: [phone]

## 2022-03-28 NOTE — Discharge Summary (Cosign Needed Addendum)
[facility] Discharge Summary  Patient name: Johnny Rodriguez Medical record number: [id number] Date of birth: 06-15-1950 Age: 72 y.o. Gender: male Date of Admission: 03/26/2022  Date of Discharge: 03/28/2022 Admitting Physician: Lowry Ram, MD  Primary Care Provider: Clovia Cuff, MD Consultants: Pulmology  Indication for Hospitalization: Dyspnea  Discharge Diagnoses/Problem List:  Principal Problem for Admission: PE Other Problems addressed during stay:  Principal Problem:   Pulmonary embolism [facility]) Active Problems:   Dyspnea secondary to COPD   [place] [facility] Course:  Johnny Rodriguez is a 72yo M w/ hx of COPD (3L [place] baseline), PAF, HFmrEF, CAD, HTN admitted for dyspnea with wheezing in the setting of likely COPD exacerbation and found to having underlying PE.  COPD exacerbation PE Admitted for increased O2 requirement (8L [place], baseline 3L [place]), dyspnea and diffuse wheezing. Reports not taking his home Breztri, treated as likely COPD exacerbation with Duonebs, Prednisone, and Dulera & Incruse inhaler. Persistently tachycardic with Wells score of 9, obtained CTA PE. Imaging showed evidence of PE, started on Eliquis. He was weaned back to baseline O2 and discharged home on Breztri (no copay), albuterol prn, prednisone (5 day course) and Azithromycin (3 day course).   PCP Follow-Up 1) Reports melena prior to admission. Hgb stable. Needs outpatient colonoscopy.  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 Alert. NAD HEENT: Normocephalic. White sclera. No rhinorrhea or congestion CV: RRR without murmur Pulm: CTAB. Normal WOB on RA. No wheezing Abdomen: Soft, non-tender, non-distended. Ext: Well perfused. Cap refill < 3  seconds Skin: Warm, dry. No rashes noted   Significant Procedures: None  Significant Labs and Imaging:  Recent Labs  Lab 03/26/22 1754 03/27/22 0350 03/28/22 0350  WBC 7.5 7.2 13.6*  HGB 13.6 13.4 13.3  HCT 43.3 42.7 40.4  PLT 219 205 211   Recent Labs  Lab 03/26/22 1754 03/27/22 0350  NA 140 138  K 3.7 4.6  CL 95* 94*  CO2 36* 30  GLUCOSE 133* 199*  BUN 11 14  CREATININE 0.98 1.11  CALCIUM 9.0 8.8*  ALKPHOS 52 50  AST 23 27  ALT 16 15  ALBUMIN 3.6 3.5    Pertinent Imaging: CT Angio Chest Pulmonary Embolism (PE) W or WO Contrast Result Date: 03/27/2022 IMPRESSION:  1. Segmental and subsegmental pulmonary embolism to the medial RIGHT lower lobe. No central or lobar level embolism.  2. Reflux of contrast into the hepatic veins could be seen with cardiac dysfunction. Correlate with echocardiography as warranted.  3. Signs of bronchial wall thickening to the RIGHT lower lobe with tree-in-bud opacities scattered throughout the RIGHT lower lobe. Findings are concerning for sequela of infectious bronchiolitis. Would also correlate with any risk factors for or history of aspiration.  4. Marked bronchial narrowing of mainstem bronchi bilaterally which show a slit like appearance. Findings could be seen in the setting of bronchomalacia. For above findings pulmonology consultation may be helpful if not yet obtained.  5. Signs of mild pulmonary emphysema.  6. Signs of prior trauma to the LEFT hemithorax with multiple LEFT-sided rib fractures which are healed.  7. Signs of coronary artery disease. Aortic Atherosclerosis (ICD10-I70.0) and Emphysema (ICD10-J43.9).  DG Chest Portable 1 View Result Date: 03/26/2022  IMPRESSION: No acute cardiopulmonary abnormality seen.  Results/Tests Pending at Time of Discharge: None  Discharge Medications:  Allergies as of 03/28/2022   No Known Allergies      Medication List     STOP taking these medications    dabigatran 150 MG Caps  capsule Commonly known as: PRADAXA   diltiazem 240 MG 24 hr capsule Commonly known as: CARDIZEM CD       TAKE these medications    albuterol (2.5 MG/3ML) 0.083% nebulizer solution Commonly known as: PROVENTIL Take 3 mLs (2.5 mg total) by nebulization every 4 (four) hours as needed for wheezing or shortness of breath. What changed: Another medication with the same name was changed. Make sure you understand how and when to take each.   Ventolin HFA 108 (90 Base) MCG/ACT inhaler Generic drug: albuterol INHALE 1-2 PUFFS INTO THE LUNGS EVERY 6 HOURS AS NEEDED FOR WHEEZING OR SHORTNESS OF BREATH What changed:  how much to take how to take this when to take this reasons to take this additional instructions   Apixaban Starter Pack (82m and 564m Commonly known as: ELIQUIS STARTER PACK Take as directed on package: start with two-84m63mablets twice daily for 7 days. On day 8, switch to one-84mg44mblet twice daily.   aspirin EC 81 MG tablet Take 81 mg by mouth daily. Swallow whole.   azithromycin 500 MG tablet Commonly known as: ZITHROMAX Take 1 tablet by mouth once Start taking on: March 29, 2022   Breztri Aerosphere 160-9-4.8 MCG/ACT Aero Generic drug: Budeson-Glycopyrrol-Formoterol Inhale 2 puffs into the lungs in the morning and at bedtime.   divalproex 125 MG capsule Commonly known as: DEPAKOTE SPRINKLE TAKE TWO CAPSULES BY MOUTH TWICE DAILY What changed: when to take this   folic acid 1 MG tablet Commonly known as: FOLVITE Take 1 tablet (1 mg total) by mouth daily.   furosemide 20 MG tablet Commonly known as: LASIX TAKE ONE TABLET (20MG) BY MOUTH EVERY DAY What changed:  how much to take how to take this when to take this additional instructions   gabapentin 400 MG capsule Commonly known as: NEURONTIN Take 400 mg by mouth 3 (three) times daily.   metoprolol succinate 50 MG 24 hr tablet Commonly known as: TOPROL-XL Take 1 tablet (50 mg total) by mouth  daily. Take with or immediately following a meal.   nicotine 14 mg/24hr patch Commonly known as: NICODERM CQ - dosed in mg/24 hours Place 1 patch (14 mg total) onto the skin daily.   OXYGEN Inhale 3 L into the lungs continuous.   polyethylene glycol 17 g packet Commonly known as: MIRALAX / GLYCOLAX Take 17 g by mouth daily. What changed:  when to take this reasons to take this   predniSONE 20 MG tablet Commonly known as: DELTASONE Take 2 tablets (40 mg total) by mouth daily with breakfast for 4 days.   senna 8.6 MG Tabs tablet Commonly known as: SENOKOT Take 1-2 tablets (8.6-17.2 mg total) by mouth daily. What changed:  when to take this reasons to take this   simvastatin 20 MG tablet Commonly known as: ZOCOR Take 1 tablet (20 mg total) by mouth daily.   spironolactone 25 MG tablet Commonly known as: ALDACTONE TAKE 1 TABLET(25 MG) BY MOUTH DAILY What changed: See the new instructions.   traZODone 50 MG tablet Commonly known as: DESYREL TAKE 1 TABLET (50MG) BY MOUTH AT BEDTIME What changed:  how much to take how to take this when to take this  reasons to take this additional instructions        Discharge Instructions: Please refer to Patient Instructions section of EMR for full details.  Patient was counseled important signs and symptoms that should prompt return to medical care, changes in medications, dietary instructions, activity restrictions, and follow up appointments.   Follow-Up Appointments:  Follow-up Information     Clovia Cuff, MD Follow up.   Specialty: Internal Medicine Contact information: [address] [place] [address] [phone]                 Colletta Maryland, MD 03/28/2022, 11:54 AM PGY-1, [place] [facility]   I was personally present and performed or re-performed the history, physical exam and medical decision making activities of this service and have verified that the service and findings are  accurately documented in the student's note.  Holley Bouche, MD                  03/30/2022, 6:04 PM

## 2022-03-28 NOTE — Assessment & Plan Note (Signed)
CTA showed evidence of PE, likely the cause of his underlying dyspnea. Started on Eliquis.

## 2022-03-28 NOTE — Progress Notes (Signed)
RN gave patient DC instructions and he stated understanding IV has been removed and he is waiting for TOC to drop off his prescriptions and RN will Call cab for DC

## 2022-03-28 NOTE — Assessment & Plan Note (Signed)
Incidental finding on CTA. Pulmonology consulted and recommending outpatient f/u at this time.

## 2022-04-01 ENCOUNTER — Other Ambulatory Visit (HOSPITAL_COMMUNITY): Payer: Self-pay

## 2022-04-01 ENCOUNTER — Telehealth: Payer: Self-pay

## 2022-04-01 NOTE — Telephone Encounter (Signed)
Copied from [place] [id number]. Topic: General - Inquiry >> Apr 01, 2022  8:14 AM Marcellus Scott wrote: Reason for CRM:Pt called in and asked if we could help him. He stated he needs adult diapers in size medium with Velcro.  Please advise.  Pt will need to contact the office of Clovia Cuff, MD at [phone]  as this is where the last forms from [employer] incontinence supplies were sent to due to Durene Fruits, NP is no longer pt pcp

## 2022-04-07 ENCOUNTER — Inpatient Hospital Stay: Payer: 59 | Admitting: Pulmonary Disease

## 2022-05-20 ENCOUNTER — Other Ambulatory Visit: Payer: Self-pay

## 2022-07-31 ENCOUNTER — Other Ambulatory Visit (HOSPITAL_COMMUNITY): Payer: Self-pay

## 2022-08-05 ENCOUNTER — Encounter: Payer: 59 | Admitting: Family

## 2022-08-05 NOTE — Progress Notes (Signed)
Erroneous encounter-disregard

## 2022-08-08 ENCOUNTER — Telehealth: Payer: Self-pay | Admitting: *Deleted

## 2022-08-08 NOTE — Telephone Encounter (Signed)
Error

## 2022-08-15 ENCOUNTER — Ambulatory Visit (INDEPENDENT_AMBULATORY_CARE_PROVIDER_SITE_OTHER): Payer: Medicare HMO | Admitting: Family

## 2022-08-15 ENCOUNTER — Encounter: Payer: Self-pay | Admitting: Family

## 2022-08-15 VITALS — BP 136/89 | HR 60 | Temp 98.7°F | Ht 71.0 in | Wt 190.0 lb

## 2022-08-15 DIAGNOSIS — Z9229 Personal history of other drug therapy: Secondary | ICD-10-CM

## 2022-08-15 DIAGNOSIS — Z13228 Encounter for screening for other metabolic disorders: Secondary | ICD-10-CM | POA: Diagnosis not present

## 2022-08-15 DIAGNOSIS — R7303 Prediabetes: Secondary | ICD-10-CM | POA: Insufficient documentation

## 2022-08-15 DIAGNOSIS — J449 Chronic obstructive pulmonary disease, unspecified: Secondary | ICD-10-CM

## 2022-08-15 DIAGNOSIS — Z130 Encounter for screening for diseases of the blood and blood-forming organs and certain disorders involving the immune mechanism: Secondary | ICD-10-CM

## 2022-08-15 DIAGNOSIS — Z8679 Personal history of other diseases of the circulatory system: Secondary | ICD-10-CM

## 2022-08-15 DIAGNOSIS — Z13220 Encounter for screening for lipoid disorders: Secondary | ICD-10-CM

## 2022-08-15 DIAGNOSIS — Z0000 Encounter for general adult medical examination without abnormal findings: Secondary | ICD-10-CM | POA: Diagnosis not present

## 2022-08-15 DIAGNOSIS — Z1329 Encounter for screening for other suspected endocrine disorder: Secondary | ICD-10-CM | POA: Diagnosis not present

## 2022-08-15 DIAGNOSIS — Z1211 Encounter for screening for malignant neoplasm of colon: Secondary | ICD-10-CM | POA: Diagnosis not present

## 2022-08-15 DIAGNOSIS — Z131 Encounter for screening for diabetes mellitus: Secondary | ICD-10-CM

## 2022-08-15 DIAGNOSIS — Z9981 Dependence on supplemental oxygen: Secondary | ICD-10-CM

## 2022-08-15 DIAGNOSIS — F17210 Nicotine dependence, cigarettes, uncomplicated: Secondary | ICD-10-CM

## 2022-08-15 LAB — POCT GLYCOSYLATED HEMOGLOBIN (HGB A1C)
HbA1c, POC (controlled diabetic range): 6.3 % (ref 0.0–7.0)
HbA1c, POC (prediabetic range): 6.3 % (ref 5.7–6.4)

## 2022-08-15 NOTE — Progress Notes (Signed)
Patient ID: Johnny Rodriguez, male    DOB: 08-Nov-1950  MRN: [id number]  CC: Annual Physical Exam  Subjective: Johnny Rodriguez is a 72 y.o. male who presents for annual physical exam.  His concerns today include:  Patient is unable to tell me which specialists he is established with for chronic heart and lung related conditions. [facility] provides him with oxygen/oxygen concentrator. Patient does not not have any issues/concerns for discussion today.  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   Pulmonary embolism (HCC) 03/27/2022   Dyspnea secondary to COPD 03/26/2022   Hypocalcemia    Hypokalemia    Atrial fibrillation with RVR (HCC) 06/05/2020   COPD with acute exacerbation (HCC) 03/11/2019   AKI (acute kidney injury) (HCC) 03/10/2019   Bowel obstruction (HCC) 03/10/2019   Chronic respiratory failure with hypoxia (HCC) 10/15/2016   Cigarette smoker 09/09/2016   Long term current use of anticoagulant therapy 06/24/2016   History of syncope 06/24/2016   COPD exacerbation (HCC) 02/08/2015   Essential hypertension 02/08/2015   Syncope 12/10/2011   History of noncompliance with medical treatment 12/01/2011   Chronic systolic heart failure (HCC) 04/10/2011   Idiopathic cardiomyopathy (HCC) 07/16/2009   Paroxysmal atrial fibrillation (HCC) 07/16/2009   COPD PFT's pending  07/16/2009   CAD (coronary artery disease) 12/15/2004     Current Outpatient Medications on File Prior to Visit  Medication Sig Dispense Refill   albuterol (PROVENTIL) (2.5 MG/3ML) 0.083% nebulizer solution Take 3 mLs (2.5 mg total) by nebulization every 4 (four) hours as needed for wheezing or shortness of breath. 1080 mL 12   APIXABAN (ELIQUIS) VTE STARTER PACK (10MG  AND 5MG ) Take as directed on package: start with two-5mg  tablets twice daily for 7 days. On day 8, switch to one-5mg  tablet twice daily. 74 each 0   aspirin EC 81 MG tablet Take 81 mg by mouth  daily. Swallow whole.     azithromycin (ZITHROMAX) 500 MG tablet Take 1 tablet by mouth once 1 tablet 0   divalproex (DEPAKOTE SPRINKLE) 125 MG capsule TAKE TWO CAPSULES BY MOUTH TWICE DAILY (Patient taking differently: Take 250 mg by mouth daily.) 120 capsule 2   folic acid (FOLVITE) 1 MG tablet Take 1 tablet (1 mg total) by mouth daily. 30 tablet 0   furosemide (LASIX) 20 MG tablet TAKE ONE TABLET (20MG ) BY MOUTH EVERY DAY (Patient taking differently: Take 20 mg by mouth daily.) 90 tablet 0   gabapentin (NEURONTIN) 400 MG capsule Take 400 mg by mouth 3 (three) times daily.     metoprolol succinate (TOPROL-XL) 50 MG 24 hr tablet Take 1 tablet (50 mg total) by mouth daily. Take with or immediately following a meal. 90 tablet 3   OXYGEN Inhale 3 L into the lungs continuous.      polyethylene glycol (MIRALAX / GLYCOLAX) 17 g packet Take 17 g by mouth daily. (Patient taking differently: Take 17 g by mouth daily as needed for mild constipation.) 30 each 0   spironolactone (ALDACTONE) 25 MG tablet TAKE 1 TABLET(25 MG) BY MOUTH DAILY (Patient taking differently: Take 25 mg by mouth daily.) 90 tablet 0   traZODone (DESYREL) 50 MG tablet TAKE 1 TABLET (50MG ) BY MOUTH AT BEDTIME (Patient taking differently: Take 50 mg by mouth at bedtime as needed for sleep.) 90 tablet 0   VENTOLIN HFA 108 (90 Base) MCG/ACT inhaler INHALE 1-2 PUFFS INTO THE LUNGS EVERY 6 HOURS AS NEEDED FOR WHEEZING OR  SHORTNESS OF BREATH (Patient taking differently: Inhale 1-2 puffs into the lungs every 6 (six) hours as needed for wheezing or shortness of breath.) 18 g 2   Budeson-Glycopyrrol-Formoterol (BREZTRI AEROSPHERE) 160-9-4.8 MCG/ACT AERO Inhale 2 puffs into the lungs in the morning and at bedtime. (Patient not taking: Reported on 03/26/2022) 10.7 g 12   senna (SENOKOT) 8.6 MG TABS tablet Take 1-2 tablets (8.6-17.2 mg total) by mouth daily. (Patient not taking: Reported on 08/15/2022) 30 tablet 0   simvastatin (ZOCOR) 20 MG tablet Take 1  tablet (20 mg total) by mouth daily. (Patient not taking: Reported on 03/26/2022) 90 tablet 0   No current facility-administered medications on file prior to visit.    No Known Allergies  Social History   Socioeconomic History   Marital status: Single    Spouse name: Not on file   Number of children: Not on file   Years of education: Not on file   Highest education level: Not on file  Occupational History   Occupation: Production manager: UNEMPLOYED  Tobacco Use   Smoking status: Every Day    Packs/day: 1.00    Years: 58.00    Additional pack years: 0.00    Total pack years: 58.00    Types: Cigarettes   Smokeless tobacco: Never   Tobacco comments:    pt states he is down to 1ppd 09/20/2020  Vaping Use   Vaping Use: Never used  Substance and Sexual Activity   Alcohol use: Yes    Alcohol/week: 3.0 standard drinks of alcohol    Types: 3 Cans of beer per week    Comment: EtOH use diorder - liquor   Drug use: No   Sexual activity: Not on file  Other Topics Concern   Not on file  Social History Narrative   ** Merged History Encounter **       Social Determinants of Health   Financial Resource Strain: Low Risk  (09/11/2020)   Overall Financial Resource Strain (CARDIA)    Difficulty of Paying Living Expenses: Not hard at all  Food Insecurity: No Food Insecurity (09/11/2020)   Hunger Vital Sign    Worried About Running Out of Food in the Last Year: Never true    Ran Out of Food in the Last Year: Never true  Transportation Needs: Unmet Transportation Needs (10/03/2021)   PRAPARE - Transportation    Lack of Transportation (Medical): Yes    Lack of Transportation (Non-Medical): Yes  Physical Activity: Inactive (09/11/2020)   Exercise Vital Sign    Days of Exercise per Week: 0 days    Minutes of Exercise per Session: 0 min  Stress: Stress Concern Present (09/11/2020)   [employer] of Occupational Health - Occupational Stress Questionnaire    Feeling of Stress :  To some extent  Social Connections: Socially Isolated (09/11/2020)   Social Connection and Isolation Panel [NHANES]    Frequency of Communication with Friends and Family: Never    Frequency of Social Gatherings with Friends and Family: Never    Attends Religious Services: Never    Database administrator or Organizations: No    Attends Banker Meetings: Never    Marital Status: Never married  Intimate Partner Violence: Not At Risk (09/11/2020)   Humiliation, Afraid, Rape, and Kick questionnaire    Fear of Current or Ex-Partner: No    Emotionally Abused: No    Physically Abused: No    Sexually Abused: No    Family  History  Problem Relation Age of Onset   Suicidality Sister    CVA Mother    Stroke Mother    Heart disease Father    Coronary artery disease Father    COPD Father     Past Surgical History:  Procedure Laterality Date   CARDIAC CATHETERIZATION  2006   CARDIAC CATHETERIZATION  12/2004   Left    CARDIOVERSION  01/21/2011   Procedure: CARDIOVERSION;  Surgeon: Darden Palmer., MD;  Location: [facility];  Service: Cardiovascular;  Laterality: N/A;   DOPPLER ECHOCARDIOGRAPHY  02/2011   ELECTROPHYSIOLOGY STUDY N/A 12/16/2011   Procedure: ELECTROPHYSIOLOGY STUDY;  Surgeon: Marinus Maw, MD;  Location: [facility] CATH LAB;  Service: Cardiovascular;  Laterality: N/A;   EP IMPLANTABLE DEVICE N/A 08/24/2015   Procedure: Loop Recorder Removal;  Surgeon: Marinus Maw, MD;  Location: [facility] INVASIVE CV LAB;  Service: Cardiovascular;  Laterality: N/A;   fx collar bone     NO PAST SURGERIES      ROS: Review of Systems Negative except as stated above  PHYSICAL EXAM: BP 136/89   Pulse 60   Temp 98.7 F (37.1 C) (Oral)   Ht 5\' 11"  (1.803 m)   Wt 190 lb (86.2 kg)   SpO2 94%   BMI 26.50 kg/m   Physical Exam HENT:     Head: Normocephalic and atraumatic.     Right Ear: Tympanic membrane, ear canal and external ear normal.     Left Ear: Tympanic membrane, ear canal and  external ear normal.     Nose: Nose normal.     Mouth/Throat:     Mouth: Mucous membranes are moist.     Pharynx: Oropharynx is clear.  Eyes:     Extraocular Movements: Extraocular movements intact.     Conjunctiva/sclera: Conjunctivae normal.     Pupils: Pupils are equal, round, and reactive to light.  Cardiovascular:     Rate and Rhythm: Normal rate and regular rhythm.     Pulses: Normal pulses.     Heart sounds: Normal heart sounds.  Pulmonary:     Effort: Pulmonary effort is normal.     Breath sounds: Normal breath sounds.  Abdominal:     General: Bowel sounds are normal.     Palpations: Abdomen is soft.  Genitourinary:    Comments: Patient declined.  Musculoskeletal:        General: Normal range of motion.     Right shoulder: Normal.     Left shoulder: Normal.     Right upper arm: Normal.     Left upper arm: Normal.     Right elbow: Normal.     Left elbow: Normal.     Right forearm: Normal.     Left forearm: Normal.     Right wrist: Normal.     Left wrist: Normal.     Right hand: Normal.     Left hand: Normal.     Cervical back: Normal, normal range of motion and neck supple.     Thoracic back: Normal.     Lumbar back: Normal.     Right hip: Normal.     Left hip: Normal.     Right upper leg: Normal.     Left upper leg: Normal.     Right knee: Normal.     Left knee: Normal.     Right lower leg: Normal.     Left lower leg: Normal.     Right ankle: Normal.  Left ankle: Normal.     Right foot: Normal.     Left foot: Normal.  Skin:    General: Skin is warm and dry.     Capillary Refill: Capillary refill takes less than 2 seconds.  Neurological:     General: No focal deficit present.     Mental Status: He is alert and oriented to person, place, and time.  Psychiatric:        Mood and Affect: Mood normal.        Behavior: Behavior normal.      ASSESSMENT AND PLAN: 1. Annual physical exam - Counseled on 150 minutes of exercise per week as tolerated,  healthy eating (including decreased daily intake of saturated fats, cholesterol, added sugars, sodium), STI prevention, and routine healthcare maintenance.  2. Screening for metabolic disorder - Routine screening.  - CMP14+EGFR  3. Screening for deficiency anemia - Routine screening.  - CBC  4. Diabetes mellitus screening - Routine screening.  - POCT glycosylated hemoglobin (Hb A1C); Future  5. Screening cholesterol level - Routine screening.  - Lipid panel  6. Thyroid disorder screen - Routine screening.  - TSH  7. Colon cancer screening - Routine screening.  - Ambulatory referral to Gastroenterology  8. History of atrial fibrillation 9. History of heart failure 10. History of anticoagulant therapy - Patient today in office with no cardiopulmonary/acute distress.  - Referral to Cardiology for further evaluation/management.  - Ambulatory referral to Cardiology  11. Chronic obstructive pulmonary disease, unspecified COPD type (HCC) 12. Cigarette smoker 13. Dependence on supplemental oxygen - Patient today in office with no cardiopulmonary/acute distress.  - Referral to Pulmonology for further evaluation/management. - Ambulatory referral to Pulmonology    Patient was given the opportunity to ask questions.  Patient verbalized understanding of the plan and was able to repeat key elements of the plan. Patient was given clear instructions to go to Emergency Department or return to medical center if symptoms don't improve, worsen, or new problems develop.The patient verbalized understanding.   Orders Placed This Encounter  Procedures   CBC   Lipid panel   TSH   CMP14+EGFR   Ambulatory referral to Gastroenterology   Ambulatory referral to Cardiology   Ambulatory referral to Pulmonology   HgB A1c    Return in about 1 year (around 08/15/2023) for Physical per patient preference and 6 months prediabetes .  Rema Fendt, NP

## 2022-08-15 NOTE — Patient Instructions (Signed)
Preventive Care 65 Years and Older, Male Preventive care refers to lifestyle choices and visits with your health care provider that can promote health and wellness. Preventive care visits are also called wellness exams. What can I expect for my preventive care visit? Counseling During your preventive care visit, your health care provider may ask about your: Medical history, including: Past medical problems. Family medical history. History of falls. Current health, including: Emotional well-being. Home life and relationship well-being. Sexual activity. Memory and ability to understand (cognition). Lifestyle, including: Alcohol, nicotine or tobacco, and drug use. Access to firearms. Diet, exercise, and sleep habits. Work and work environment. Sunscreen use. Safety issues such as seatbelt and bike helmet use. Physical exam Your health care provider will check your: Height and weight. These may be used to calculate your BMI (body mass index). BMI is a measurement that tells if you are at a healthy weight. Waist circumference. This measures the distance around your waistline. This measurement also tells if you are at a healthy weight and may help predict your risk of certain diseases, such as type 2 diabetes and high blood pressure. Heart rate and blood pressure. Body temperature. Skin for abnormal spots. What immunizations do I need?  Vaccines are usually given at various ages, according to a schedule. Your health care provider will recommend vaccines for you based on your age, medical history, and lifestyle or other factors, such as travel or where you work. What tests do I need? Screening Your health care provider may recommend screening tests for certain conditions. This may include: Lipid and cholesterol levels. Diabetes screening. This is done by checking your blood sugar (glucose) after you have not eaten for a while (fasting). Hepatitis C test. Hepatitis B test. HIV (human  immunodeficiency virus) test. STI (sexually transmitted infection) testing, if you are at risk. Lung cancer screening. Colorectal cancer screening. Prostate cancer screening. Abdominal aortic aneurysm (AAA) screening. You may need this if you are a current or former smoker. Talk with your health care provider about your test results, treatment options, and if necessary, the need for more tests. Follow these instructions at home: Eating and drinking  Eat a diet that includes fresh fruits and vegetables, whole grains, lean protein, and low-fat dairy products. Limit your intake of foods with high amounts of sugar, saturated fats, and salt. Take vitamin and mineral supplements as recommended by your health care provider. Do not drink alcohol if your health care provider tells you not to drink. If you drink alcohol: Limit how much you have to 0-2 drinks a day. Know how much alcohol is in your drink. In the [place] one drink equals one 12 oz bottle of beer (355 mL), one 5 oz glass of wine (148 mL), or one 1 oz glass of hard liquor (44 mL). Lifestyle Brush your teeth every morning and night with fluoride toothpaste. Floss one time each day. Exercise for at least 30 minutes 5 or more days each week. Do not use any products that contain nicotine or tobacco. These products include cigarettes, chewing tobacco, and vaping devices, such as e-cigarettes. If you need help quitting, ask your health care provider. Do not use drugs. If you are sexually active, practice safe sex. Use a condom or other form of protection to prevent STIs. Take aspirin only as told by your health care provider. Make sure that you understand how much to take and what form to take. Work with your health care provider to find out whether it is safe   and beneficial for you to take aspirin daily. Ask your health care provider if you need to take a cholesterol-lowering medicine (statin). Find healthy ways to manage stress, such  as: Meditation, yoga, or listening to music. Journaling. Talking to a trusted person. Spending time with friends and family. Safety Always wear your seat belt while driving or riding in a vehicle. Do not drive: If you have been drinking alcohol. Do not ride with someone who has been drinking. When you are tired or distracted. While texting. If you have been using any mind-altering substances or drugs. Wear a helmet and other protective equipment during sports activities. If you have firearms in your house, make sure you follow all gun safety procedures. Minimize exposure to UV radiation to reduce your risk of skin cancer. What's next? Visit your health care provider once a year for an annual wellness visit. Ask your health care provider how often you should have your eyes and teeth checked. Stay up to date on all vaccines. This information is not intended to replace advice given to you by your health care provider. Make sure you discuss any questions you have with your health care provider. Document Revised: 07/25/2020 Document Reviewed: 07/25/2020 Elsevier Patient Education  2024 Elsevier Inc.  

## 2022-08-16 LAB — CMP14+EGFR
ALT: 9 IU/L (ref 0–44)
AST: 13 IU/L (ref 0–40)
Albumin: 4 g/dL (ref 3.9–4.9)
Alkaline Phosphatase: 73 IU/L (ref 44–121)
BUN/Creatinine Ratio: 16 (ref 10–24)
BUN: 16 mg/dL (ref 8–27)
Bilirubin Total: 0.4 mg/dL (ref 0.0–1.2)
CO2: 28 mmol/L (ref 20–29)
Calcium: 9.5 mg/dL (ref 8.6–10.2)
Chloride: 100 mmol/L (ref 96–106)
Creatinine, Ser: 1 mg/dL (ref 0.76–1.27)
Globulin, Total: 2.6 g/dL (ref 1.5–4.5)
Glucose: 87 mg/dL (ref 70–99)
Potassium: 4.9 mmol/L (ref 3.5–5.2)
Sodium: 147 mmol/L — ABNORMAL HIGH (ref 134–144)
Total Protein: 6.6 g/dL (ref 6.0–8.5)
eGFR: 81 mL/min/{1.73_m2} (ref >59)

## 2022-08-16 LAB — LIPID PANEL
Chol/HDL Ratio: 3.3 ratio (ref 0.0–5.0)
Cholesterol, Total: 161 mg/dL (ref 100–199)
HDL: 49 mg/dL (ref >39)
LDL Chol Calc (NIH): 87 mg/dL (ref 0–99)
Triglycerides: 141 mg/dL (ref 0–149)
VLDL Cholesterol Cal: 25 mg/dL (ref 5–40)

## 2022-08-16 LAB — CBC
Hematocrit: 44 % (ref 37.5–51.0)
Hemoglobin: 14.2 g/dL (ref 13.0–17.7)
MCH: 30.3 pg (ref 26.6–33.0)
MCHC: 32.3 g/dL (ref 31.5–35.7)
MCV: 94 fL (ref 79–97)
Platelets: 202 10*3/uL (ref 150–450)
RBC: 4.69 x10E6/uL (ref 4.14–5.80)
RDW: 13.1 % (ref 11.6–15.4)
WBC: 7.3 10*3/uL (ref 3.4–10.8)

## 2022-08-16 LAB — TSH: TSH: 0.752 u[IU]/mL (ref 0.450–4.500)

## 2022-08-18 ENCOUNTER — Telehealth: Payer: Self-pay | Admitting: Internal Medicine

## 2022-08-18 NOTE — Telephone Encounter (Signed)
Copied from CRM [id number]. Topic: General - Other >> Aug 18, 2022  1:55 PM Ja-Kwan M wrote: Reason for CRM: Georgetta with [employer] called for an update on the Personal Care Service paperwork that was faxed on the following dates: 08/13/22 at 2:46 pm, 08/08/22, and 08/01/22. Cb# [phone]

## 2022-08-29 NOTE — Telephone Encounter (Signed)
I have the paperwork, but his pcp shows someone else, tried to call and ask him if he plans on switching to Amy as his pcp before I fill them out.

## 2022-09-02 DIAGNOSIS — Z9981 Dependence on supplemental oxygen: Secondary | ICD-10-CM | POA: Diagnosis not present

## 2022-09-02 DIAGNOSIS — I2510 Atherosclerotic heart disease of native coronary artery without angina pectoris: Secondary | ICD-10-CM | POA: Diagnosis not present

## 2022-09-02 DIAGNOSIS — Z9181 History of falling: Secondary | ICD-10-CM | POA: Diagnosis not present

## 2022-09-02 DIAGNOSIS — I509 Heart failure, unspecified: Secondary | ICD-10-CM | POA: Diagnosis not present

## 2022-09-02 DIAGNOSIS — J4489 Other specified chronic obstructive pulmonary disease: Secondary | ICD-10-CM | POA: Diagnosis not present

## 2022-09-05 NOTE — Telephone Encounter (Signed)
Pts phone is still off

## 2022-09-17 DIAGNOSIS — I509 Heart failure, unspecified: Secondary | ICD-10-CM | POA: Diagnosis not present

## 2022-09-17 DIAGNOSIS — G894 Chronic pain syndrome: Secondary | ICD-10-CM | POA: Diagnosis not present

## 2022-09-17 DIAGNOSIS — I10 Essential (primary) hypertension: Secondary | ICD-10-CM | POA: Diagnosis not present

## 2022-09-17 DIAGNOSIS — I4891 Unspecified atrial fibrillation: Secondary | ICD-10-CM | POA: Diagnosis not present

## 2022-10-03 DIAGNOSIS — I2510 Atherosclerotic heart disease of native coronary artery without angina pectoris: Secondary | ICD-10-CM | POA: Diagnosis not present

## 2022-10-03 DIAGNOSIS — J4489 Other specified chronic obstructive pulmonary disease: Secondary | ICD-10-CM | POA: Diagnosis not present

## 2022-10-03 DIAGNOSIS — I509 Heart failure, unspecified: Secondary | ICD-10-CM | POA: Diagnosis not present

## 2022-10-03 DIAGNOSIS — Z9181 History of falling: Secondary | ICD-10-CM | POA: Diagnosis not present

## 2022-10-03 DIAGNOSIS — Z9981 Dependence on supplemental oxygen: Secondary | ICD-10-CM | POA: Diagnosis not present

## 2022-10-15 ENCOUNTER — Telehealth: Payer: Self-pay | Admitting: Internal Medicine

## 2022-10-15 DIAGNOSIS — Z1211 Encounter for screening for malignant neoplasm of colon: Secondary | ICD-10-CM

## 2022-10-15 NOTE — Telephone Encounter (Signed)
Copied from CRM [id number]. Topic: General - Other >> Oct 15, 2022  9:17 AM Dondra Prader A wrote: Reason for CRM: Tresea Mall with [facility] states that the pt is needing a colonoscopy and the nurse could not schedule the colonoscopy for pt because someone would have to take him and sit and wait for him. [name] is needing help getting the pt scheduled for a colonoscopy. Per [name] pt is on 3 liters per minute of oxygen and is wanting to see if PCP could write him a prescription for Chantix to curve his tobacco craving. [name] is also faxing over a [facility] referral form and is needing the PCP to fill this out and send it back to her as soon as possible. Please call [name] back.

## 2022-10-16 NOTE — Telephone Encounter (Signed)
See routing comment(s) for message information.

## 2022-10-16 NOTE — Telephone Encounter (Signed)
-   Erskine Squibb if you will please assist patient with colonoscopy appointment. Patient needs assistance with someone to take him to and from appointment.  - Pulmonology managing chronic conditions including COPD and smoking cessation. Please consult with the same regarding medication management. - Schedule appointment for [facility] form completion.

## 2022-10-17 ENCOUNTER — Telehealth: Payer: Self-pay | Admitting: Family

## 2022-10-17 ENCOUNTER — Telehealth: Payer: Self-pay | Admitting: *Deleted

## 2022-10-17 DIAGNOSIS — J449 Chronic obstructive pulmonary disease, unspecified: Secondary | ICD-10-CM | POA: Diagnosis not present

## 2022-10-17 DIAGNOSIS — M25512 Pain in left shoulder: Secondary | ICD-10-CM | POA: Diagnosis not present

## 2022-10-17 DIAGNOSIS — G894 Chronic pain syndrome: Secondary | ICD-10-CM | POA: Diagnosis not present

## 2022-10-17 DIAGNOSIS — K5900 Constipation, unspecified: Secondary | ICD-10-CM | POA: Diagnosis not present

## 2022-10-17 NOTE — Telephone Encounter (Signed)
Copied from CRM [id number]. Topic: General - Other >> Oct 17, 2022 11:21 AM Franchot Heidelberg wrote: Reason for CRM: [name] from [facility] called to speak to Robyne Peers  Best contact: [phone]

## 2022-10-17 NOTE — Progress Notes (Signed)
  Care Coordination   Note   10/17/2022 Name: Maxxon Kukura MRN: [id number] DOB: [birth year]  Arliss Outhouse is a 72 y.o. year old male who sees Rema Fendt, NP for primary care. I reached out to [name] by phone today to offer care coordination services.  Mr. Makosky was given information about Care Coordination services today including:   The Care Coordination services include support from the care team which includes your Nurse Coordinator, Clinical Social Worker, or Pharmacist.  The Care Coordination team is here to help remove barriers to the health concerns and goals most important to you. Care Coordination services are voluntary, and the patient may decline or stop services at any time by request to their care team member.   Care Coordination Consent Status: Patient agreed to services and verbal consent obtained.   Follow up plan:  Telephone appointment with care coordination team member scheduled for:  10/20/22  Encounter Outcome:  Patient Scheduled  [facility] Coordination Care Guide  Direct Dial: [phone]

## 2022-10-17 NOTE — Telephone Encounter (Signed)
Johnny Rodriguez,   - Thank you for colonoscopy appointment assistance request through [facility] Care Management. - Pulmonary medication advisement is for [name] to update [employer].  - Will you be able to complete [facility] form for patient if possible? If not please update me due to our office policy schedules appointments for paperwork completion when providers need to complete.  - If you find out any information from Dr. Darleene Cleaver that I can assist with please let me know.   Please keep me updated if I can further assist.   Thank you

## 2022-10-20 ENCOUNTER — Encounter: Payer: Self-pay | Admitting: Licensed Clinical Social Worker

## 2022-10-20 NOTE — Telephone Encounter (Signed)
I spoke to Johnny Rodriguez  complex care case management.   She was inquiring about the [facility] referral and I told her that has been completed and will be sent to [facility] when provider has signed it.   Regarding the colonoscopy, she said they have a person who can take patient to and from colonoscopy and stay there while he has the procedure.  The individual who will stay with him is Johnny Rodriguez who is with [employer], his Geophysicist/field seismologist. Her number : [phone]. I explained to [name] that [name] can call [name] to schedule the colonoscopy with  the patient so she can make sure she is able to accompany him. I provided her with the phone number for [facility].

## 2022-10-20 NOTE — Telephone Encounter (Addendum)
Nolon Stalls, care coordinator, for patient has called requesting a call back from Robyne Peers, RN. Please advise and contact Erica back @ # [phone]  Advised [name] about the message below, to contact [facility], [name] stated she called [facility] and she stated they will not answer or call back. Please advise. [name] wants Robyne Peers, RN, to schedule the colonoscopy anytime after 11am and next week. Please advise.

## 2022-10-20 NOTE — Telephone Encounter (Signed)
Noted  

## 2022-10-20 NOTE — Telephone Encounter (Signed)
I returned the call to [place]: [phone] and was not able to leave a message because the voicemail was not set up

## 2022-10-21 ENCOUNTER — Telehealth: Payer: Self-pay | Admitting: Licensed Clinical Social Worker

## 2022-10-21 NOTE — Telephone Encounter (Signed)
I tried to reach  [place]: [phone] again and was not able to leave a message because the voicemail was not set up

## 2022-10-21 NOTE — Patient Outreach (Signed)
  Care Coordination   10/20/2022 Name: Johnny Rodriguez MRN: [id number] DOB: Dec 10, 1950   Care Coordination Outreach Attempts:  An unsuccessful telephone outreach was attempted for a scheduled appointment today.  Follow Up Plan:  Additional outreach attempts will be made to offer the patient care coordination information and services.   Encounter Outcome:  No Answer   Care Coordination Interventions:  No, not indicated    Jenel Lucks, MSW, LCSW [facility] Care Management [place]  [email or web address] Phone [phone] 10:46 AM

## 2022-10-23 ENCOUNTER — Encounter: Payer: Self-pay | Admitting: Licensed Clinical Social Worker

## 2022-10-23 ENCOUNTER — Ambulatory Visit: Payer: Self-pay

## 2022-10-23 NOTE — Patient Outreach (Signed)
  Care Coordination   Initial Visit Note   10/21/2022 Name: Johnny Rodriguez MRN: [id number] DOB: 1950/11/07  Saburo Teaff is a 72 y.o. year old male who sees Rema Fendt, NP for primary care. I spoke with  [name] by phone today.  What matters to the patients health and wellness today?  Supportive Resources    Goals Addressed             This Visit's Progress    LCSW Obtain Supportive Resources-Aid   On track    Activities and task to complete in order to accomplish goals.   Keep all upcoming appointments discussed today Continue with compliance of taking medication prescribed by Doctor Implement healthy coping skills discussed to assist with management of symptoms         SDOH assessments and interventions completed:  No     Care Coordination Interventions:  Yes, provided  Interventions Today    Flowsheet Row Most Recent Value  Chronic Disease   Chronic disease during today's visit Atrial Fibrillation (AFib), Hypertension (HTN), Chronic Obstructive Pulmonary Disease (COPD), Other  [CAD]  General Interventions   General Interventions Discussed/Reviewed General Interventions Discussed, Doctor Visits, Community Resources, Level of Care  Doctor Visits Discussed/Reviewed Doctor Visits Discussed  Level of Care Personal Care Services, Skilled Nursing Facility, Assisted Living  Mental Health Interventions   Mental Health Discussed/Reviewed Mental Health Discussed, Coping Strategies  Nutrition Interventions   Nutrition Discussed/Reviewed Nutrition Discussed  Pharmacy Interventions   Pharmacy Dicussed/Reviewed Pharmacy Topics Discussed, Medication Adherence  Safety Interventions   Safety Discussed/Reviewed Safety Discussed, Home Safety  [Pt has an electric wheelchair and cane to promote safety in the home while ambulating]       Follow up plan: Follow up call scheduled for 1-3 weeks    Encounter Outcome:  Patient Visit Completed   Jenel Lucks,  MSW, LCSW [facility] Care Management [facility]  [facility] [email or web address] Phone [phone] 9:03 AM

## 2022-10-23 NOTE — Telephone Encounter (Signed)
Chief Complaint: Abdominal pain Symptoms: abdominal pain, abdominal swelling, constipation  Frequency: ongoing Pertinent Negatives: Patient denies back pain, nausea, vomiting Disposition: [] ED /[] Urgent Care (no appt availability in office) / [] Appointment(In office/virtual)/ []  [place] [facility]/ [] Home Care/ [x] Refused Recommended Disposition /[] [place] [facility]/ []  Follow-up with PCP Additional Notes: Patient states that he has had abdominal pain for years that comes and goes. Patient states he had a bowel movement today that was small and hard and he feels like he is constipated. He is taking over the counter medication for constipation at this time. Patient states he is not sure if his abdomen is swollen but the home health nurse reported abdominal swelling. Home health nurse report patient will have transportation to Urgent Care on Wednesday. Advised patient to callback if symptoms get worse. Offered patient an appointment with PCP and patient stated he has a home health doctor that comes to see him. Patient decline appointment at this time and stated he will report symptoms to home health doctor. Reason for Disposition  Abdominal pain is a chronic symptom (recurrent or ongoing AND present > 4 weeks)  Answer Assessment - Initial Assessment Questions 1. LOCATION: "Where does it hurt?"      All over  2. RADIATION: "Does the pain shoot anywhere else?" (e.g., chest, back)     Sometimes in my chest  3. ONSET: "When did the pain begin?" (Minutes, hours or days ago)      Ongoing  4. SUDDEN: "Gradual or sudden onset?"     gradual 5. PATTERN "Does the pain come and go, or is it constant?"    - If it comes and goes: "How long does it last?" "Do you have pain now?"     (Note: Comes and goes means the pain is intermittent. It goes away completely between bouts.)    - If constant: "Is it getting better, staying the same, or getting worse?"      (Note: Constant means the pain never goes  away completely; most serious pain is constant and gets worse.)      Comes and goes  6. SEVERITY: "How bad is the pain?"  (e.g., Scale 1-10; mild, moderate, or severe)    - MILD (1-3): Doesn't interfere with normal activities, abdomen soft and not tender to touch.     - MODERATE (4-7): Interferes with normal activities or awakens from sleep, abdomen tender to touch.     - SEVERE (8-10): Excruciating pain, doubled over, unable to do any normal activities.       9/10 7. RECURRENT SYMPTOM: "Have you ever had this type of stomach pain before?" If Yes, ask: "When was the last time?" and "What happened that time?"      Yes, when I am constipated  8. CAUSE: "What do you think is causing the stomach pain?"     Constipation  9. RELIEVING/AGGRAVATING FACTORS: "What makes it better or worse?" (e.g., antacids, bending or twisting motion, bowel movement)     Bowel movement  10. OTHER SYMPTOMS: "Do you have any other symptoms?" (e.g., back pain, diarrhea, fever, urination pain, vomiting)       Constipation, stomachache  Protocols used: Abdominal Pain - Male-A-AH

## 2022-10-23 NOTE — Telephone Encounter (Signed)
  I tried to reach  [place]: [phone] again and was not able to leave a message because the voicemail was not set up        I then called Gwyndolyn Saxon / [employer] to see if she has another phone number for [place]. She then told me that [name] is supposed to be at the patiet's house this afternoon and I may be able to reach her there.    I called the patient and had to leave a message requesting a call back.  I also want to see if he has a phone number for Dr Darleene Cleaver.

## 2022-10-23 NOTE — Telephone Encounter (Addendum)
Call was disconnected before patient could be added to the call. Reporting patient has constipation and abdominal. Will call patient to triage.

## 2022-10-23 NOTE — Patient Outreach (Signed)
  Care Coordination   Follow Up Visit Note   10/23/2022 Name: Johnny Rodriguez MRN: [id number] DOB: December 26, 1950  Johnny Rodriguez is a 72 y.o. year old male who sees Rema Fendt, NP for primary care. I  engaged with RNCM at PCP office  What matters to the patients health and wellness today?  Patient was not engaged during this encounter    Goals Addressed             This Visit's Progress    LCSW Obtain Supportive Resources-Aid   On track    Activities and task to complete in order to accomplish goals.   Keep all upcoming appointments discussed today Continue with compliance of taking medication prescribed by Doctor Implement healthy coping skills discussed to assist with management of symptoms         SDOH assessments and interventions completed:  No     Care Coordination Interventions:  Yes, provided  Interventions Today    Flowsheet Row Most Recent Value  Chronic Disease   Chronic disease during today's visit Atrial Fibrillation (AFib), Chronic Obstructive Pulmonary Disease (COPD)  General Interventions   General Interventions Discussed/Reviewed Communication with  Communication with RN  [employer] collaborated with RNCM at PCP office. Pt has behavioral health support and PCS referral has been provided to CMA for completion.]       Follow up plan: Follow up call scheduled for 1-2 weeks    Encounter Outcome:  Patient Visit Completed   Jenel Lucks, MSW, LCSW [facility] Care Management [facility]  [facility] [email or web address] Phone [phone] 7:31 PM

## 2022-10-23 NOTE — Patient Instructions (Signed)
Visit Information  Thank you for taking time to visit with me today. Please don't hesitate to contact me if I can be of assistance to you.   Following are the goals we discussed today:   Goals Addressed             This Visit's Progress    LCSW Obtain Supportive Resources-Aid   On track    Activities and task to complete in order to accomplish goals.   Keep all upcoming appointments discussed today Continue with compliance of taking medication prescribed by Doctor Implement healthy coping skills discussed to assist with management of symptoms         Our next appointment is by telephone on 09/17 at 11 AM  Please call the care guide team at [phone] if you need to cancel or reschedule your appointment.   If you are experiencing a Mental Health or Behavioral Health Crisis or need someone to talk to, please call the Suicide and Crisis Lifeline: 988 call 911   The patient verbalized understanding of instructions, educational materials, and care plan provided today and DECLINED offer to receive copy of patient instructions, educational materials, and care plan.   Jenel Lucks, MSW, LCSW [facility] Care Management [place]  [facility] [email or web address] Phone [phone] 9:04 AM

## 2022-10-28 ENCOUNTER — Encounter: Payer: Self-pay | Admitting: Licensed Clinical Social Worker

## 2022-10-30 NOTE — Patient Outreach (Signed)
Care Coordination   Collaboration  Visit Note   10/28/2022 Name: Johnny Rodriguez MRN: [id number] DOB: May 06, 1950  Johnny Rodriguez is a 72 y.o. year old male who sees Rema Fendt, NP for primary care. I  engaged with CMA with one of Community [facility] Clinics  What matters to the patients health and wellness today?  Patient was not engaged during this encounter    SDOH assessments and interventions completed:  No     Care Coordination Interventions:  Yes, provided  Interventions Today    Flowsheet Row Most Recent Value  Chronic Disease   Chronic disease during today's visit Atrial Fibrillation (AFib), Chronic Obstructive Pulmonary Disease (COPD)  [CAD]  General Interventions   General Interventions Discussed/Reviewed Level of Care  Level of Care Personal Care Services  [LCSW was informed that PCS form was submitted by CMA by [facility] staff. LCSW collaborated with CMA regarding submission of PCS application, informed CMA did not complete, LCSW will reach out to [facility] about pending application for PCS aid services]       Follow up plan:  LCSW will f/up with Sheralyn Boatman regarding pending applications to assist pt with obtaining an aid    Encounter Outcome:  Patient Visit Completed   Jenel Lucks, MSW, LCSW [facility] Care Management [facility]  [facility] [email or web address] Phone [phone] 11:50 AM

## 2022-11-03 DIAGNOSIS — Z9181 History of falling: Secondary | ICD-10-CM | POA: Diagnosis not present

## 2022-11-03 DIAGNOSIS — J4489 Other specified chronic obstructive pulmonary disease: Secondary | ICD-10-CM | POA: Diagnosis not present

## 2022-11-03 DIAGNOSIS — Z9981 Dependence on supplemental oxygen: Secondary | ICD-10-CM | POA: Diagnosis not present

## 2022-11-03 DIAGNOSIS — I2510 Atherosclerotic heart disease of native coronary artery without angina pectoris: Secondary | ICD-10-CM | POA: Diagnosis not present

## 2022-11-03 DIAGNOSIS — I509 Heart failure, unspecified: Secondary | ICD-10-CM | POA: Diagnosis not present

## 2022-11-03 NOTE — Telephone Encounter (Signed)
  I tried to reach  [employer] : [phone] again and was not able to leave a message because the voicemail was not set up

## 2022-11-04 NOTE — Telephone Encounter (Signed)
I tried to reach  [employer] : [phone] again and was not able to leave a message because the voicemail was not set up

## 2022-11-05 NOTE — Telephone Encounter (Signed)
Report to Urgent Care/Emergency Department/call 911 for immediate medical evaluation. Schedule follow-up appointment with Primary Care at discharge.

## 2022-11-06 ENCOUNTER — Telehealth: Payer: Self-pay | Admitting: Family

## 2022-11-06 NOTE — Telephone Encounter (Signed)
Copied from CRM [id number]. Topic: General - Inquiry >> Nov 06, 2022  3:27 PM Haroldine Laws wrote: Reason for CRM: Ms. Earley Favor with [facility] said she has faxed a form twice about personal care services that he needs.  She said she had spoke to someone and they said it was received but it has not been sent back to them  CB#  [phone]

## 2022-11-06 NOTE — Telephone Encounter (Signed)
Copied from CRM [id number]. Topic: General - Inquiry >> Nov 06, 2022  2:25 PM Clide Dales wrote: Toniann Fail with [employer] called to see if patient could get another Rolator walker. Patient's current one is broke. Please advise. [phone]

## 2022-11-07 ENCOUNTER — Ambulatory Visit: Payer: Self-pay | Admitting: Licensed Clinical Social Worker

## 2022-11-07 NOTE — Telephone Encounter (Signed)
Please consult with [facility]. Please let me know if I can further assist. Thank you.

## 2022-11-10 NOTE — Patient Instructions (Signed)
Visit Information  Thank you for taking time to visit with me today. Please don't hesitate to contact me if I can be of assistance to you.   Following are the goals we discussed today:   Goals Addressed             This Visit's Progress    LCSW Obtain Supportive Resources-Aid   On track    Activities and task to complete in order to accomplish goals.   Keep all upcoming appointments discussed today Continue with compliance of taking medication prescribed by Doctor Implement healthy coping skills discussed to assist with management of symptoms         Please call the care guide team at [phone] if you need to cancel or reschedule your appointment.   If you are experiencing a Mental Health or Behavioral Health Crisis or need someone to talk to, please call the Suicide and Crisis Lifeline: 988 call 911   The patient verbalized understanding of instructions, educational materials, and care plan provided today and DECLINED offer to receive copy of patient instructions, educational materials, and care plan.   Jenel Lucks, MSW, LCSW [facility] Care Management [place]  [facility] [email or web address] Phone [phone] 5:59 PM'

## 2022-11-10 NOTE — Patient Outreach (Signed)
Care Coordination   Follow Up Visit Note   11/07/2022 Name: Johnny Rodriguez MRN: [id number] DOB: March 06, 1950  Johnny Rodriguez is a 72 y.o. year old male who sees Rema Fendt, NP for primary care. I spoke with  Johnny Rodriguez by phone today.  What matters to the patients health and wellness today?  Level of Care    Goals Addressed             This Visit's Progress    LCSW Obtain Supportive Resources-Aid   On track    Activities and task to complete in order to accomplish goals.   Keep all upcoming appointments discussed today Continue with compliance of taking medication prescribed by Doctor Implement healthy coping skills discussed to assist with management of symptoms         SDOH assessments and interventions completed:  No     Care Coordination Interventions:  Yes, provided  Interventions Today    Flowsheet Row Most Recent Value  Chronic Disease   Chronic disease during today's visit Atrial Fibrillation (AFib), Chronic Obstructive Pulmonary Disease (COPD)  General Interventions   General Interventions Discussed/Reviewed General Interventions Reviewed, Level of Care  Doctor Visits Discussed/Reviewed Doctor Visits Reviewed  Level of Care Personal Care Services  Mental Health Interventions   Mental Health Discussed/Reviewed Mental Health Reviewed, Coping Strategies       Follow up plan: Follow up call scheduled for 1-2 weeks    Encounter Outcome:  Patient Visit Completed   Jenel Lucks, MSW, LCSW [facility] Care Management [facility]  [facility] [email or web address] Phone [phone] 5:59 PM

## 2022-11-17 ENCOUNTER — Encounter: Payer: Self-pay | Admitting: Licensed Clinical Social Worker

## 2022-11-17 ENCOUNTER — Emergency Department (HOSPITAL_COMMUNITY)
Admission: EM | Admit: 2022-11-17 | Discharge: 2022-11-17 | Disposition: A | Discharge status: Home / Self Care | Payer: Medicare HMO | Attending: Emergency Medicine | Admitting: Emergency Medicine

## 2022-11-17 ENCOUNTER — Telehealth: Payer: Self-pay | Admitting: Family

## 2022-11-17 ENCOUNTER — Encounter (HOSPITAL_COMMUNITY): Payer: Self-pay

## 2022-11-17 ENCOUNTER — Emergency Department (HOSPITAL_COMMUNITY): Payer: Medicare HMO

## 2022-11-17 ENCOUNTER — Other Ambulatory Visit: Payer: Self-pay

## 2022-11-17 DIAGNOSIS — R000 Tachycardia, unspecified: Secondary | ICD-10-CM | POA: Diagnosis not present

## 2022-11-17 DIAGNOSIS — Z79899 Other long term (current) drug therapy: Secondary | ICD-10-CM | POA: Insufficient documentation

## 2022-11-17 DIAGNOSIS — R600 Localized edema: Secondary | ICD-10-CM | POA: Diagnosis not present

## 2022-11-17 DIAGNOSIS — J441 Chronic obstructive pulmonary disease with (acute) exacerbation: Secondary | ICD-10-CM | POA: Diagnosis not present

## 2022-11-17 DIAGNOSIS — R079 Chest pain, unspecified: Secondary | ICD-10-CM | POA: Diagnosis not present

## 2022-11-17 DIAGNOSIS — R0602 Shortness of breath: Secondary | ICD-10-CM | POA: Diagnosis present

## 2022-11-17 DIAGNOSIS — I10 Essential (primary) hypertension: Secondary | ICD-10-CM | POA: Diagnosis not present

## 2022-11-17 DIAGNOSIS — Z7982 Long term (current) use of aspirin: Secondary | ICD-10-CM | POA: Diagnosis not present

## 2022-11-17 DIAGNOSIS — R0789 Other chest pain: Secondary | ICD-10-CM | POA: Diagnosis not present

## 2022-11-17 LAB — BASIC METABOLIC PANEL
Anion gap: 15 (ref 5–15)
BUN: 13 mg/dL (ref 8–23)
CO2: 33 mmol/L — ABNORMAL HIGH (ref 22–32)
Calcium: 9.5 mg/dL (ref 8.9–10.3)
Chloride: 89 mmol/L — ABNORMAL LOW (ref 98–111)
Creatinine, Ser: 1.06 mg/dL (ref 0.61–1.24)
GFR, Estimated: >60 mL/min (ref >60)
Glucose, Bld: 117 mg/dL — ABNORMAL HIGH (ref 70–99)
Potassium: 4.4 mmol/L (ref 3.5–5.1)
Sodium: 137 mmol/L (ref 135–145)

## 2022-11-17 LAB — CBC
HCT: 48 % (ref 39.0–52.0)
Hemoglobin: 15.4 g/dL (ref 13.0–17.0)
MCH: 31.4 pg (ref 26.0–34.0)
MCHC: 32.1 g/dL (ref 30.0–36.0)
MCV: 97.8 fL (ref 80.0–100.0)
Platelets: 276 10*3/uL (ref 150–400)
RBC: 4.91 MIL/uL (ref 4.22–5.81)
RDW: 13.2 % (ref 11.5–15.5)
WBC: 7.6 10*3/uL (ref 4.0–10.5)
nRBC: 0 % (ref 0.0–0.2)

## 2022-11-17 LAB — BRAIN NATRIURETIC PEPTIDE: B Natriuretic Peptide: 62.2 pg/mL (ref 0.0–100.0)

## 2022-11-17 LAB — TROPONIN I (HIGH SENSITIVITY)
Troponin I (High Sensitivity): 7 ng/L (ref <18)
Troponin I (High Sensitivity): 7 ng/L (ref <18)

## 2022-11-17 MED ORDER — ALBUTEROL SULFATE (2.5 MG/3ML) 0.083% IN NEBU
5.0000 mg | INHALATION_SOLUTION | Freq: Once | RESPIRATORY_TRACT | Status: AC
  Administered 2022-11-17: 5 mg via RESPIRATORY_TRACT
  Filled 2022-11-17: qty 6

## 2022-11-17 MED ORDER — DOXYCYCLINE HYCLATE 100 MG PO TABS
100.0000 mg | ORAL_TABLET | Freq: Once | ORAL | Status: AC
  Administered 2022-11-17: 100 mg via ORAL
  Filled 2022-11-17: qty 1

## 2022-11-17 MED ORDER — DEXAMETHASONE 4 MG PO TABS
6.0000 mg | ORAL_TABLET | Freq: Once | ORAL | Status: AC
  Administered 2022-11-17: 6 mg via ORAL
  Filled 2022-11-17: qty 2

## 2022-11-17 MED ORDER — DOXYCYCLINE HYCLATE 100 MG PO TABS
100.0000 mg | ORAL_TABLET | Freq: Two times a day (BID) | ORAL | 0 refills | Status: DC
Start: 2022-11-17 — End: 2022-12-05
  Filled 2022-11-17 – 2022-11-18 (×2): qty 14, 7d supply, fill #0

## 2022-11-17 NOTE — ED Provider Triage Note (Signed)
Emergency Medicine Provider Triage Evaluation Note  Johnny Rodriguez , a 72 y.o. male  was evaluated in triage.  Pt complains of out of supplemental oxygen and chest pain. Patient reports that he was at court earlier today when he ran out of his supplemental oxygen. He's on 3L normally. He had shortness of breath and chest pain at the time of the incident. Patient reports persistent chest pain localized to his sternum.  Review of Systems  Positive: Chest pain, SOB Negative: Fever, N/V  Physical Exam  BP (!) 134/117   Pulse 97   Temp (!) 97.2 F (36.2 C) (Oral)   Resp 17   Ht 5\' 11"  (1.803 m)   Wt 86.2 kg   SpO2 (!) 88%   BMI 26.50 kg/m  Gen:   Awake, no distress   Resp:  Normal effort  MSK:   Moves extremities without difficulty  Other:    Medical Decision Making  Medically screening exam initiated at 1:02 PM.  Appropriate orders placed.  Johnny Rodriguez was informed that the remainder of the evaluation will be completed by another provider, this initial triage assessment does not replace that evaluation, and the importance of remaining in the ED until their evaluation is complete.   Maxwell Marion, PA-C 11/17/22 1304

## 2022-11-17 NOTE — ED Provider Notes (Signed)
[place] EMERGENCY DEPARTMENT AT [facility] Provider Note   CSN: [id number] Arrival date & time: 11/17/22  1127     History  Chief Complaint  Patient presents with   Shortness of Breath    Johnny Rodriguez is a 72 y.o. male.  Patient is a 72 year old male who has a history of cardiomyopathy, paroxysmal atrial fibrillation, COPD on home oxygen, hypertension and prior PE.  He presents with chest pain and shortness of breath.  [place] due to his oxygen tanks ran out.  He said he was not expecting to be there as long as he was.  He says that he developed some pain in the center of his chest associate with some shortness of breath.  He says he always gets this when he runs out of his oxygen.  He denies any fever.  He has had a productive cough which is productive of green sputum.  No significant change in his leg swelling.  He describes chest pain as tightness in the center of his chest.       Home Medications Prior to Admission medications   Medication Sig Start Date End Date Taking? Authorizing Provider  doxycycline (VIBRAMYCIN) 100 MG capsule Take 1 capsule (100 mg total) by mouth 2 (two) times daily. One po bid x 7 days 11/17/22  Yes Rolan Bucco, MD  albuterol (PROVENTIL) (2.5 MG/3ML) 0.083% nebulizer solution Take 3 mLs (2.5 mg total) by nebulization every 4 (four) hours as needed for wheezing or shortness of breath. 10/07/21   Martina Sinner, MD  APIXABAN Everlene Balls) VTE STARTER PACK (10MG  AND 5MG ) Take as directed on package: start with two-5mg  tablets twice daily for 7 days. On day 8, switch to one-5mg  tablet twice daily. 03/28/22   Bess Kinds, MD  aspirin EC 81 MG tablet Take 81 mg by mouth daily. Swallow whole.    [provider]  azithromycin (ZITHROMAX) 500 MG tablet Take 1 tablet by mouth once 03/29/22   Bess Kinds, MD  Budeson-Glycopyrrol-Formoterol (BREZTRI AEROSPHERE) 160-9-4.8  MCG/ACT AERO Inhale 2 puffs into the lungs in the morning and at bedtime. Patient not taking: Reported on 03/26/2022 10/07/21   Martina Sinner, MD  divalproex (DEPAKOTE SPRINKLE) 125 MG capsule TAKE TWO CAPSULES BY MOUTH TWICE DAILY Patient taking differently: Take 250 mg by mouth daily. 03/06/21   Rema Fendt, NP  folic acid (FOLVITE) 1 MG tablet Take 1 tablet (1 mg total) by mouth daily. 03/27/22   Bess Kinds, MD  furosemide (LASIX) 20 MG tablet TAKE ONE TABLET (20MG ) BY MOUTH EVERY DAY Patient taking differently: Take 20 mg by mouth daily. 06/10/21   Rema Fendt, NP  gabapentin (NEURONTIN) 400 MG capsule Take 400 mg by mouth 3 (three) times daily.    [provider]  metoprolol succinate (TOPROL-XL) 50 MG 24 hr tablet Take 1 tablet (50 mg total) by mouth daily. Take with or immediately following a meal. 06/03/21   Rema Fendt, NP  OXYGEN Inhale 3 L into the lungs continuous.     [provider]  polyethylene glycol (MIRALAX / GLYCOLAX) 17 g packet Take 17 g by mouth daily. Patient taking differently: Take 17 g by mouth daily as needed for mild constipation. 03/15/19   Dellia Cloud, MD  senna (SENOKOT) 8.6 MG TABS tablet Take 1-2 tablets (8.6-17.2 mg total) by mouth daily. Patient not taking: Reported on 08/15/2022 03/15/19  Dellia Cloud, MD  simvastatin (ZOCOR) 20 MG tablet Take 1 tablet (20 mg total) by mouth daily. Patient not taking: Reported on 03/26/2022 06/27/20   Rema Fendt, NP  spironolactone (ALDACTONE) 25 MG tablet TAKE 1 TABLET(25 MG) BY MOUTH DAILY Patient taking differently: Take 25 mg by mouth daily. 10/08/20   Rema Fendt, NP  traZODone (DESYREL) 50 MG tablet TAKE 1 TABLET (50MG ) BY MOUTH AT BEDTIME Patient taking differently: Take 50 mg by mouth at bedtime as needed for sleep. 06/10/21   Rema Fendt, NP  VENTOLIN HFA 108 (90 Base) MCG/ACT inhaler INHALE 1-2 PUFFS INTO THE LUNGS EVERY 6 HOURS AS NEEDED FOR WHEEZING OR SHORTNESS OF  BREATH Patient taking differently: Inhale 1-2 puffs into the lungs every 6 (six) hours as needed for wheezing or shortness of breath. 10/07/21   Martina Sinner, MD      Allergies    Patient has no known allergies.    Review of Systems   Review of Systems  Constitutional:  Negative for chills, diaphoresis, fatigue and fever.  HENT:  Negative for congestion, rhinorrhea and sneezing.   Eyes: Negative.   Respiratory:  Positive for cough and shortness of breath. Negative for chest tightness.   Cardiovascular:  Positive for chest pain. Negative for leg swelling.  Gastrointestinal:  Negative for abdominal pain, blood in stool, diarrhea, nausea and vomiting.  Genitourinary:  Negative for difficulty urinating, flank pain, frequency and hematuria.  Musculoskeletal:  Negative for arthralgias and back pain.  Skin:  Negative for rash.  Neurological:  Negative for dizziness, speech difficulty, weakness, numbness and headaches.    Physical Exam Updated Vital Signs BP 105/83   Pulse (!) 58   Temp (!) 97.5 F (36.4 C)   Resp 19   Ht 5\' 11"  (1.803 m)   Wt 86.2 kg   SpO2 97%   BMI 26.50 kg/m  Physical Exam Constitutional:      Appearance: He is well-developed.  HENT:     Head: Normocephalic and atraumatic.  Eyes:     Pupils: Pupils are equal, round, and reactive to light.  Cardiovascular:     Rate and Rhythm: Regular rhythm. Tachycardia present.     Heart sounds: Normal heart sounds.  Pulmonary:     Effort: Pulmonary effort is normal. No respiratory distress.     Breath sounds: Wheezing present. No rales.  Chest:     Chest wall: No tenderness.  Abdominal:     General: Bowel sounds are normal.     Palpations: Abdomen is soft.     Tenderness: There is no abdominal tenderness. There is no guarding or rebound.  Musculoskeletal:        General: Normal range of motion.     Cervical back: Normal range of motion and neck supple.     Comments: 1+ pitting edema to lower extremities  bilaterally  Lymphadenopathy:     Cervical: No cervical adenopathy.  Skin:    General: Skin is warm and dry.     Findings: No rash.  Neurological:     Mental Status: He is alert and oriented to person, place, and time.     ED Results / Procedures / Treatments   Labs (all labs ordered are listed, but only abnormal results are displayed) Labs Reviewed  BASIC METABOLIC PANEL - Abnormal; Notable for the following components:      Result Value   Chloride 89 (*)    CO2 33 (*)    Glucose, Bld  117 (*)    All other components within normal limits  CBC  BRAIN NATRIURETIC PEPTIDE  TROPONIN I (HIGH SENSITIVITY)  TROPONIN I (HIGH SENSITIVITY)    EKG EKG Interpretation Date/Time:  Monday November 17 2022 12:53:32 EDT Ventricular Rate:  115 PR Interval:    QRS Duration:  82 QT Interval:  312 QTC Calculation: 431 R Axis:   267  Text Interpretation: Atrial fibrillation with rapid ventricular response Right superior axis deviation Nonspecific ST and T wave abnormality Abnormal ECG When compared with ECG of 26-Mar-2022 17:37, PREVIOUS ECG IS PRESENT since last tracing no significant change Confirmed by Rolan Bucco [phone]) on 11/17/2022 4:09:23 PM  Radiology DG Chest 2 View  Result Date: 11/17/2022 CLINICAL DATA:  Chest pain. EXAM: CHEST - 2 VIEW COMPARISON:  March 26, 2022. FINDINGS: The heart size and mediastinal contours are within normal limits. Both lungs are clear. The visualized skeletal structures are unremarkable. IMPRESSION: No active cardiopulmonary disease. Electronically Signed   By: Lupita Raider M.D.   On: 11/17/2022 15:16    Procedures Procedures    Medications Ordered in ED Medications  doxycycline (VIBRA-TABS) tablet 100 mg (has no administration in time range)  dexamethasone (DECADRON) tablet 6 mg (has no administration in time range)  albuterol (PROVENTIL) (2.5 MG/3ML) 0.083% nebulizer solution 5 mg (5 mg Nebulization Given 11/17/22 1656)    ED Course/  Medical Decision Making/ A&P                                 Medical Decision Making Amount and/or Complexity of Data Reviewed Labs: ordered. Radiology: ordered.  Risk Prescription drug management.   Patient is a 72 year old who presents with chest pain after he ran out of his oxygen.  He also had some associated shortness of breath.  His chest pain has resolved.  His EKG does not show any ischemic changes.  He has had 2 negative troponins.  Chest x-ray was interpreted by me and confirmed by the radiologist to show no evidence of pneumonia.  No pneumothorax.  No pulmonary edema.  He did have some wheezing on exam.  He was given nebulizer treatments with some improvement in symptoms.  He is on his baseline oxygen.  He said his symptoms have improved and he feels like he is ready to go home.  He was given a dose of Decadron in the ED.  He has had a significant change in his sputum over the last few days and was started on doxycycline for this.  Was given a first dose in the ED.  He is otherwise well-appearing and ready to leave.  He was discharged home in good condition.  His heart rate is slightly elevated in the low 100s.  He is in atrial fibrillation which is apparently chronic for him based on prior EKGs.  He doesn't want to stay for further treatment.  He was discharged home in good condition.  He was encouraged to have close follow-up with his PCP.  Return precautions were given.  Final Clinical Impression(s) / ED Diagnoses Final diagnoses:  COPD exacerbation (HCC)  Chest pain, unspecified type    Rx / DC Orders ED Discharge Orders          Ordered    doxycycline (VIBRAMYCIN) 100 MG capsule  2 times daily        11/17/22 1924  Rolan Bucco, MD 11/17/22 [phone]

## 2022-11-17 NOTE — Telephone Encounter (Signed)
Patient called form form completion from pcp

## 2022-11-17 NOTE — ED Notes (Signed)
Patient is discharged but doesn't have any more oxygen in his tanks for the drive home. Advised that he stay to get safe transport home. Patient denies it and wants to go home either way, he says [place] and he will be alright. Patient has refill tanks once he gets home.

## 2022-11-17 NOTE — Discharge Instructions (Addendum)
Follow-up with your primary care doctor within the next few days for recheck.  Return to the emergency room if you have any worsening symptoms. 

## 2022-11-17 NOTE — ED Triage Notes (Addendum)
[place] and his heart started "flip flopping" and had chest pain today. Pt wears 3L 02 per [place] at baseline

## 2022-11-17 NOTE — Telephone Encounter (Signed)
Johnny Rodriguez called in, states [facility] faxed in pc service requesting , in June July August and Sept, and still no response. I  have went ahead and put the request in today also.

## 2022-11-17 NOTE — Telephone Encounter (Signed)
Home Health Verbal Orders - Caller/Agency: Jenel Lucks from [facility] health/but please cb [employer] for orders  Callback Number: [phone]/georgetta smallwood Requesting Skilled Nursing/PCA services Frequency: ?

## 2022-11-18 ENCOUNTER — Emergency Department (HOSPITAL_COMMUNITY): Payer: Medicare HMO

## 2022-11-18 ENCOUNTER — Other Ambulatory Visit: Payer: Self-pay

## 2022-11-18 ENCOUNTER — Other Ambulatory Visit (HOSPITAL_COMMUNITY): Payer: Self-pay

## 2022-11-18 ENCOUNTER — Emergency Department (HOSPITAL_COMMUNITY)
Admission: EM | Admit: 2022-11-18 | Discharge: 2022-11-18 | Disposition: A | Discharge status: Home / Self Care | Payer: Medicare HMO | Attending: Emergency Medicine | Admitting: Emergency Medicine

## 2022-11-18 DIAGNOSIS — R109 Unspecified abdominal pain: Secondary | ICD-10-CM | POA: Diagnosis not present

## 2022-11-18 DIAGNOSIS — R0602 Shortness of breath: Secondary | ICD-10-CM | POA: Insufficient documentation

## 2022-11-18 DIAGNOSIS — I4891 Unspecified atrial fibrillation: Secondary | ICD-10-CM | POA: Diagnosis not present

## 2022-11-18 DIAGNOSIS — R609 Edema, unspecified: Secondary | ICD-10-CM | POA: Diagnosis not present

## 2022-11-18 DIAGNOSIS — K5900 Constipation, unspecified: Secondary | ICD-10-CM | POA: Insufficient documentation

## 2022-11-18 DIAGNOSIS — R1013 Epigastric pain: Secondary | ICD-10-CM | POA: Diagnosis not present

## 2022-11-18 DIAGNOSIS — Z7982 Long term (current) use of aspirin: Secondary | ICD-10-CM | POA: Diagnosis not present

## 2022-11-18 DIAGNOSIS — R918 Other nonspecific abnormal finding of lung field: Secondary | ICD-10-CM | POA: Diagnosis not present

## 2022-11-18 DIAGNOSIS — R079 Chest pain, unspecified: Secondary | ICD-10-CM | POA: Insufficient documentation

## 2022-11-18 DIAGNOSIS — Z7901 Long term (current) use of anticoagulants: Secondary | ICD-10-CM | POA: Diagnosis not present

## 2022-11-18 DIAGNOSIS — R0689 Other abnormalities of breathing: Secondary | ICD-10-CM | POA: Diagnosis not present

## 2022-11-18 DIAGNOSIS — Z743 Need for continuous supervision: Secondary | ICD-10-CM | POA: Diagnosis not present

## 2022-11-18 DIAGNOSIS — J449 Chronic obstructive pulmonary disease, unspecified: Secondary | ICD-10-CM | POA: Diagnosis not present

## 2022-11-18 DIAGNOSIS — R140 Abdominal distension (gaseous): Secondary | ICD-10-CM | POA: Diagnosis not present

## 2022-11-18 DIAGNOSIS — K4090 Unilateral inguinal hernia, without obstruction or gangrene, not specified as recurrent: Secondary | ICD-10-CM | POA: Diagnosis not present

## 2022-11-18 DIAGNOSIS — R531 Weakness: Secondary | ICD-10-CM | POA: Diagnosis not present

## 2022-11-18 LAB — URINALYSIS, ROUTINE W REFLEX MICROSCOPIC
Bilirubin Urine: NEGATIVE
Glucose, UA: NEGATIVE mg/dL
Ketones, ur: NEGATIVE mg/dL
Leukocytes,Ua: NEGATIVE
Nitrite: NEGATIVE
Protein, ur: 30 mg/dL — AB
Specific Gravity, Urine: 1.015 (ref 1.005–1.030)
pH: 5 (ref 5.0–8.0)

## 2022-11-18 LAB — CBC WITH DIFFERENTIAL/PLATELET
Abs Immature Granulocytes: 0.04 10*3/uL (ref 0.00–0.07)
Basophils Absolute: 0 10*3/uL (ref 0.0–0.1)
Basophils Relative: 0 %
Eosinophils Absolute: 0 10*3/uL (ref 0.0–0.5)
Eosinophils Relative: 0 %
HCT: 46.6 % (ref 39.0–52.0)
Hemoglobin: 15.1 g/dL (ref 13.0–17.0)
Immature Granulocytes: 1 %
Lymphocytes Relative: 4 %
Lymphs Abs: 0.4 10*3/uL — ABNORMAL LOW (ref 0.7–4.0)
MCH: 30.6 pg (ref 26.0–34.0)
MCHC: 32.4 g/dL (ref 30.0–36.0)
MCV: 94.3 fL (ref 80.0–100.0)
Monocytes Absolute: 0.2 10*3/uL (ref 0.1–1.0)
Monocytes Relative: 3 %
Neutro Abs: 7.5 10*3/uL (ref 1.7–7.7)
Neutrophils Relative %: 92 %
Platelets: 307 10*3/uL (ref 150–400)
RBC: 4.94 MIL/uL (ref 4.22–5.81)
RDW: 13.3 % (ref 11.5–15.5)
WBC: 8.1 10*3/uL (ref 4.0–10.5)
nRBC: 0 % (ref 0.0–0.2)

## 2022-11-18 LAB — COMPREHENSIVE METABOLIC PANEL
ALT: 14 U/L (ref 0–44)
AST: 21 U/L (ref 15–41)
Albumin: 3.6 g/dL (ref 3.5–5.0)
Alkaline Phosphatase: 71 U/L (ref 38–126)
Anion gap: 11 (ref 5–15)
BUN: 15 mg/dL (ref 8–23)
CO2: 30 mmol/L (ref 22–32)
Calcium: 9 mg/dL (ref 8.9–10.3)
Chloride: 93 mmol/L — ABNORMAL LOW (ref 98–111)
Creatinine, Ser: 1.16 mg/dL (ref 0.61–1.24)
GFR, Estimated: >60 mL/min (ref >60)
Glucose, Bld: 128 mg/dL — ABNORMAL HIGH (ref 70–99)
Potassium: 4.9 mmol/L (ref 3.5–5.1)
Sodium: 134 mmol/L — ABNORMAL LOW (ref 135–145)
Total Bilirubin: 0.7 mg/dL (ref 0.3–1.2)
Total Protein: 7.6 g/dL (ref 6.5–8.1)

## 2022-11-18 LAB — BRAIN NATRIURETIC PEPTIDE: B Natriuretic Peptide: 65.7 pg/mL (ref 0.0–100.0)

## 2022-11-18 LAB — TROPONIN I (HIGH SENSITIVITY)
Troponin I (High Sensitivity): 6 ng/L (ref <18)
Troponin I (High Sensitivity): 6 ng/L (ref <18)

## 2022-11-18 LAB — LIPASE, BLOOD: Lipase: 23 U/L (ref 11–51)

## 2022-11-18 MED ORDER — IPRATROPIUM-ALBUTEROL 0.5-2.5 (3) MG/3ML IN SOLN
3.0000 mL | Freq: Once | RESPIRATORY_TRACT | Status: AC
  Administered 2022-11-18: 3 mL via RESPIRATORY_TRACT
  Filled 2022-11-18: qty 3

## 2022-11-18 MED ORDER — IOHEXOL 350 MG/ML SOLN
75.0000 mL | Freq: Once | INTRAVENOUS | Status: AC | PRN
  Administered 2022-11-18: 75 mL via INTRAVENOUS

## 2022-11-18 NOTE — ED Notes (Signed)
Pt states he has oxygen tanks at home but no one can bring here for him to go home with personal supply. RN to call [facility]. EDP updated.

## 2022-11-18 NOTE — Patient Outreach (Signed)
Care Coordination   Collaboration  Visit Note   11/17/2022 Name: Javante Foronda MRN: [id number] DOB: Jul 04, 1950  Collis Fleming is a 72 y.o. year old male who sees Rema Fendt, NP for primary care. I  engaged with PCP office  What matters to the patients health and wellness today?  Level of Care-Personal Care Services     SDOH assessments and interventions completed:  No     Care Coordination Interventions:  Yes, provided  Interventions Today    Flowsheet Row Most Recent Value  Chronic Disease   Chronic disease during today's visit Atrial Fibrillation (AFib), Chronic Obstructive Pulmonary Disease (COPD)  General Interventions   General Interventions Discussed/Reviewed General Interventions Reviewed, Communication with  Communication with Social Work, PCP/Specialists  [LCSW collaborated with Ms. [name] Smallwood with [employer]. She has made multiple attempts to have PCS form completed by PCP office for aid services for pt. LCSW collaborated with Engineering geologist and CMA about [employer] request. Contact info provided]       Follow up plan: Follow up call scheduled for 1-2 weeks    Encounter Outcome:  Patient Visit Completed   Jenel Lucks, MSW, LCSW [facility] Care Management [facility]  [facility] [email or web address] Phone [phone] 12:33 PM

## 2022-11-18 NOTE — ED Provider Triage Note (Signed)
Emergency Medicine Provider Triage Evaluation Note  Johnny Rodriguez , a 72 y.o. male  was evaluated in triage.  Pt w/ several complaints.  SOB and epigastric pain.   -SOB: H/o COPD/asthma. Has had significant DOE that has been worsening daily since he was diagnosed w/ CHF about a year ago. +cough that is worse now than normal. Productive of dark green sputum. Denies f/c, hemoptysis. A/w central chest pain and wheezing. Inhalers at home help some. Denies leg swelling. Wears 3L of O2 at home. [place] and always gets worsening symptoms when that happens. Has h/o cardiomyopathy, paroxysmal Afib, and h/o prior PE.   -Epigastric pain: has been going on "forever" but is worse now. Feels like a big ball of jelly. Rated 10/10. Constant. Reports he needs an enema. Unsure when his last BM was, states it was so long ago he cannot remember. Denies urinary sxs. Does usually have constipation. Does not normally take meds for constipation.   Presented here to ED yesterday for same symptoms but left prior to workup being complete because he had to change the diaper he was wearing.   Review of Systems  Positive: SOB/DOE, CP, epigastric pain Negative: F/c, leg swelling, hemoptysis, urinary sxs  Physical Exam  BP 120/73 (BP Location: Left Arm)   Pulse (!) 103   Temp 97.6 F (36.4 C) (Oral)   Resp 18   SpO2 94%  Gen:   Awake, no distress   Resp:  Normal effort, on 3L [place] MSK:   Moves extremities without difficulty  Medical Decision Making  Medically screening exam initiated at 9:13 AM.  Appropriate orders placed.  [name] was informed that the remainder of the evaluation will be completed by another provider, this initial triage assessment does not replace that evaluation, and the importance of remaining in the ED until their evaluation is complete.    Loetta Rough, MD 11/18/22 [phone]

## 2022-11-18 NOTE — ED Triage Notes (Signed)
EMS stated, pt. With SOB , and epigastric pain since yesterday has been in A-Fib. Marland Kitchen Here yesterday with the same symptoms but left

## 2022-11-18 NOTE — ED Triage Notes (Signed)
Pt. Stated, I need an enema I can't go and I can't remember my last normal.,

## 2022-11-18 NOTE — ED Notes (Signed)
PT informed [facility] will come get but will be long wait. He agreed.

## 2022-11-18 NOTE — Patient Instructions (Signed)
Visit Information  Thank you for taking time to visit with me today. Please don't hesitate to contact me if I can be of assistance to you.    Our next appointment is by telephone on 10/11 at 3 PM  Please call the care guide team at [phone] if you need to cancel or reschedule your appointment.   If you are experiencing a Mental Health or Behavioral Health Crisis or need someone to talk to, please call the Suicide and Crisis Lifeline: 988 call 911   The patient verbalized understanding of instructions, educational materials, and care plan provided today and DECLINED offer to receive copy of patient instructions, educational materials, and care plan.   Jenel Lucks, MSW, LCSW [facility] Care Management [place]  [facility] [email or web address] Phone [phone] 12:34 PM

## 2022-11-18 NOTE — ED Provider Notes (Signed)
[place] EMERGENCY DEPARTMENT AT [facility] Provider Note   CSN: [id number] Arrival date & time: 11/18/22  0347     History {Add pertinent medical, surgical, social history, OB history to HPI:1} Chief Complaint  Patient presents with   Shortness of Breath   Chest Pain   Abdominal Pain   Atrial Fibrillation   Constipation    Johnny Rodriguez is a 72 y.o. male.  72 year old male present emergency department with chief complaint of abdominal pain.  Reports also having some shortness of breath as well.  Reports his abdominal pain has been going on for some time, but somewhat worsened over the past 4 days.  No bowel movement, but is passing gas.  No nausea no vomiting.  Patient is a poor historian, but denies prior abdominal surgeries.   Shortness of Breath Associated symptoms: abdominal pain and chest pain   Chest Pain Associated symptoms: abdominal pain and shortness of breath   Abdominal Pain Associated symptoms: chest pain, constipation and shortness of breath   Atrial Fibrillation Associated symptoms include chest pain, abdominal pain and shortness of breath.  Constipation Associated symptoms: abdominal pain        Home Medications Prior to Admission medications   Medication Sig Start Date End Date Taking? Authorizing Provider  albuterol (PROVENTIL) (2.5 MG/3ML) 0.083% nebulizer solution Take 3 mLs (2.5 mg total) by nebulization every 4 (four) hours as needed for wheezing or shortness of breath. 10/07/21   Martina Sinner, MD  APIXABAN Everlene Balls) VTE STARTER PACK (10MG  AND 5MG ) Take as directed on package: start with two-5mg  tablets twice daily for 7 days. On day 8, switch to one-5mg  tablet twice daily. 03/28/22   Bess Kinds, MD  aspirin EC 81 MG tablet Take 81 mg by mouth daily. Swallow whole.    [provider]  azithromycin (ZITHROMAX) 500 MG tablet Take 1 tablet by mouth once 03/29/22   Bess Kinds, MD  Budeson-Glycopyrrol-Formoterol  (BREZTRI AEROSPHERE) 160-9-4.8 MCG/ACT AERO Inhale 2 puffs into the lungs in the morning and at bedtime. Patient not taking: Reported on 03/26/2022 10/07/21   Martina Sinner, MD  divalproex (DEPAKOTE SPRINKLE) 125 MG capsule TAKE TWO CAPSULES BY MOUTH TWICE DAILY Patient taking differently: Take 250 mg by mouth daily. 03/06/21   Rema Fendt, NP  doxycycline (VIBRA-TABS) 100 MG tablet Take 1 tablet (100 mg total) by mouth 2 (two) times daily for 7 days. 11/17/22   Rolan Bucco, MD  folic acid (FOLVITE) 1 MG tablet Take 1 tablet (1 mg total) by mouth daily. 03/27/22   Bess Kinds, MD  furosemide (LASIX) 20 MG tablet TAKE ONE TABLET (20MG ) BY MOUTH EVERY DAY Patient taking differently: Take 20 mg by mouth daily. 06/10/21   Rema Fendt, NP  gabapentin (NEURONTIN) 400 MG capsule Take 400 mg by mouth 3 (three) times daily.    [provider]  metoprolol succinate (TOPROL-XL) 50 MG 24 hr tablet Take 1 tablet (50 mg total) by mouth daily. Take with or immediately following a meal. 06/03/21   Rema Fendt, NP  OXYGEN Inhale 3 L into the lungs continuous.     [provider]  polyethylene glycol (MIRALAX / GLYCOLAX) 17 g packet Take 17 g by mouth daily. Patient taking differently: Take 17 g by mouth daily as needed for mild constipation. 03/15/19   Dellia Cloud, MD  senna (SENOKOT) 8.6 MG TABS tablet Take 1-2 tablets (8.6-17.2 mg total) by mouth daily. Patient not taking: Reported on 08/15/2022  03/15/19   Dellia Cloud, MD  simvastatin (ZOCOR) 20 MG tablet Take 1 tablet (20 mg total) by mouth daily. Patient not taking: Reported on 03/26/2022 06/27/20   Rema Fendt, NP  spironolactone (ALDACTONE) 25 MG tablet TAKE 1 TABLET(25 MG) BY MOUTH DAILY Patient taking differently: Take 25 mg by mouth daily. 10/08/20   Rema Fendt, NP  traZODone (DESYREL) 50 MG tablet TAKE 1 TABLET (50MG ) BY MOUTH AT BEDTIME Patient taking differently: Take 50 mg by mouth at bedtime as needed for sleep.  06/10/21   Rema Fendt, NP  VENTOLIN HFA 108 (90 Base) MCG/ACT inhaler INHALE 1-2 PUFFS INTO THE LUNGS EVERY 6 HOURS AS NEEDED FOR WHEEZING OR SHORTNESS OF BREATH Patient taking differently: Inhale 1-2 puffs into the lungs every 6 (six) hours as needed for wheezing or shortness of breath. 10/07/21   Martina Sinner, MD      Allergies    Patient has no known allergies.    Review of Systems   Review of Systems  Respiratory:  Positive for shortness of breath.   Cardiovascular:  Positive for chest pain.  Gastrointestinal:  Positive for abdominal pain and constipation.    Physical Exam Updated Vital Signs BP 120/73 (BP Location: Left Arm)   Pulse (!) 103   Temp 97.6 F (36.4 C) (Oral)   Resp 18   SpO2 94%  Physical Exam Vitals and nursing note reviewed.  Constitutional:      General: He is not in acute distress.    Appearance: He is not toxic-appearing.  HENT:     Head: Normocephalic.     Nose: Nose normal.     Mouth/Throat:     Mouth: Mucous membranes are moist.  Eyes:     Conjunctiva/sclera: Conjunctivae normal.  Cardiovascular:     Rate and Rhythm: Normal rate and regular rhythm.  Pulmonary:     Effort: Pulmonary effort is normal.     Breath sounds: No wheezing, rhonchi or rales.  Abdominal:     General: Abdomen is flat. There is no distension.     Tenderness: There is abdominal tenderness. There is no guarding or rebound.  Musculoskeletal:        General: Normal range of motion.  Skin:    General: Skin is warm and dry.  Neurological:     General: No focal deficit present.     Mental Status: He is alert.  Psychiatric:        Mood and Affect: Mood normal.        Behavior: Behavior normal.     ED Results / Procedures / Treatments   Labs (all labs ordered are listed, but only abnormal results are displayed) Labs Reviewed  CBC WITH DIFFERENTIAL/PLATELET - Abnormal; Notable for the following components:      Result Value   Lymphs Abs 0.4 (*)    All other  components within normal limits  COMPREHENSIVE METABOLIC PANEL - Abnormal; Notable for the following components:   Sodium 134 (*)    Chloride 93 (*)    Glucose, Bld 128 (*)    All other components within normal limits  URINALYSIS, ROUTINE W REFLEX MICROSCOPIC - Abnormal; Notable for the following components:   Hgb urine dipstick SMALL (*)    Protein, ur 30 (*)    Bacteria, UA RARE (*)    All other components within normal limits  SARS CORONAVIRUS 2 BY RT PCR  BRAIN NATRIURETIC PEPTIDE  LIPASE, BLOOD  TROPONIN I (HIGH SENSITIVITY)  TROPONIN I (HIGH SENSITIVITY)    EKG EKG Interpretation Date/Time:  Tuesday November 18 2022 08:58:55 EDT Ventricular Rate:  117 PR Interval:    QRS Duration:  80 QT Interval:  322 QTC Calculation: 449 R Axis:   260  Text Interpretation: Atrial fibrillation with rapid ventricular response Right superior axis deviation Anterior infarct , age undetermined T wave abnormality, consider lateral ischemia Abnormal ECG When compared with ECG of 17-Nov-2022 12:53, PREVIOUS ECG IS PRESENT Confirmed by Estanislado Pandy [phone]) on 11/18/2022 1:06:00 PM  Radiology DG Chest 1 View  Result Date: 11/18/2022 CLINICAL DATA:  Chest pain.  Shortness of breath. EXAM: CHEST  1 VIEW COMPARISON:  11/17/2022. FINDINGS: Essentially stable exam. Redemonstration of linear areas of atelectasis and/or scarring in the bilateral upper lung zones, similar to the prior study. There is a opacity in the left lower lung zone partially obscuring the left heart border, which may represent atelectasis and/or pneumonitis in the left lung lower lobe. Bilateral lung fields are otherwise clear. Bilateral costophrenic angles are clear. Normal cardio-mediastinal silhouette. No acute osseous abnormalities. The soft tissues are within normal limits. IMPRESSION: 1. Left lower lobe atelectasis and/or pneumonitis. Electronically Signed   By: Jules Schick M.D.   On: 11/18/2022 11:16   DG Chest 2  View  Result Date: 11/17/2022 CLINICAL DATA:  Chest pain. EXAM: CHEST - 2 VIEW COMPARISON:  March 26, 2022. FINDINGS: The heart size and mediastinal contours are within normal limits. Both lungs are clear. The visualized skeletal structures are unremarkable. IMPRESSION: No active cardiopulmonary disease. Electronically Signed   By: Lupita Raider M.D.   On: 11/17/2022 15:16    Procedures Procedures  {Document cardiac monitor, telemetry assessment procedure when appropriate:1}  Medications Ordered in ED Medications  ipratropium-albuterol (DUONEB) 0.5-2.5 (3) MG/3ML nebulizer solution 3 mL (3 mLs Nebulization Given 11/18/22 6045)    ED Course/ Medical Decision Making/ A&P Clinical Course as of 11/18/22 1510  Tue Nov 18, 2022  1305 DG Chest 1 View IMPRESSION: 1. Left lower lobe atelectasis and/or pneumonitis.   [TY]    Clinical Course User Index [TY] Coral Spikes, DO   {   Click here for ABCD2, HEART and other calculatorsREFRESH Note before signing :1}                              Medical Decision Making 85-year-old male present emergency department for abdominal pain.  Complex past medical history, COPD on 2 L oxygen, A-fib on Eliquis.  Patient appears to be in A-fib, similar heart rate yesterday and last few EKGs.  Did have some abdominal tenderness on exam.  Triage labs largely reassuring.  No fever or leukocytosis to suggest systemic infection.  No transaminitis suggest hepatobiliary disease.  Lipase is not elevated; pancreatitis unlikely.  Chest x-ray without pneumonia.  Reports improvement in shortness of breath after DuoNeb in triage.  Given age and risk factors and bounce back from yesterday will get CT scan of abdomen to evaluate for cause of his abdominal pain.  Amount and/or Complexity of Data Reviewed External Data Reviewed:     Details: Does not appear to have had a CT scan in several years. Labs: ordered. Decision-making details documented in ED Course. Radiology:  ordered. Decision-making details documented in ED Course.  Risk Prescription drug management.    {Document critical care time when appropriate:1} {Document review of labs and clinical decision tools ie heart score, Chads2Vasc2 etc:1}  {Document  your independent review of radiology images, and any outside records:1} {Document your discussion with family members, caretakers, and with consultants:1} {Document social determinants of health affecting pt's care:1} {Document your decision making why or why not admission, treatments were needed:1} Final Clinical Impression(s) / ED Diagnoses Final diagnoses:  None    Rx / DC Orders ED Discharge Orders     None

## 2022-11-18 NOTE — ED Provider Notes (Signed)
  Physical Exam  BP 104/67 (BP Location: Right Arm)   Pulse 99   Temp 98.2 F (36.8 C) (Oral)   Resp 19   SpO2 98%   Physical Exam  Procedures  Procedures  ED Course / MDM   Clinical Course as of 11/18/22 2315  Tue Nov 18, 2022  1305 DG Chest 1 View IMPRESSION: 1. Left lower lobe atelectasis and/or pneumonitis.   [TY]    Clinical Course User Index [TY] Coral Spikes, DO   Medical Decision Making Amount and/or Complexity of Data Reviewed Labs: ordered. Radiology: ordered. Decision-making details documented in ED Course.  Risk Prescription drug management.   Received in signout.  Abdominal pain.  Has had for a while.  Workup reassuring.  CT scan reassuring.  Feels better.  Will discharge home       Benjiman Core, MD 11/18/22 2316

## 2022-11-20 DIAGNOSIS — R32 Unspecified urinary incontinence: Secondary | ICD-10-CM | POA: Diagnosis not present

## 2022-11-21 ENCOUNTER — Ambulatory Visit: Payer: Self-pay | Admitting: Licensed Clinical Social Worker

## 2022-11-21 ENCOUNTER — Ambulatory Visit: Payer: Medicare HMO

## 2022-11-21 DIAGNOSIS — Z0000 Encounter for general adult medical examination without abnormal findings: Secondary | ICD-10-CM

## 2022-11-21 NOTE — Patient Instructions (Signed)
Johnny Rodriguez , Thank you for taking time to come for your Medicare Wellness Visit. I appreciate your ongoing commitment to your health goals. Please review the following plan we discussed and let me know if I can assist you in the future.   Referrals/Orders/Follow-Ups/Clinician Recommendations: none  This is a list of the screening recommended for you and due dates:  Health Maintenance  Topic Date Due   Colon Cancer Screening  Never done   Zoster (Shingles) Vaccine (1 of 2) Never done   Pneumonia Vaccine (2 of 2 - PCV) 02/16/2017   Flu Shot  09/11/2022   COVID-19 Vaccine (1 - 2023-24 season) Never done   Screening for Lung Cancer  03/28/2023   Medicare Annual Wellness Visit  11/21/2023   DTaP/Tdap/Td vaccine (2 - Td or Tdap) 05/15/2024   Hepatitis C Screening  Completed   HPV Vaccine  Aged Out    Advanced directives: (ACP Link)Information on Advanced Care Planning can be found at [facility] Advance Health Care Directives Advance Health Care Directives ([email or web address]   Next Medicare Annual Wellness Visit scheduled for next year: No, schedule not open for next year  insert Preventive Care attachment Insert FALL PREVENTION attachment if needed

## 2022-11-21 NOTE — Progress Notes (Addendum)
Subjective:   Johnny Rodriguez is a 72 y.o. male who presents for Medicare Annual/Subsequent preventive examination.  Visit Complete: Virtual I connected with  Johnny Rodriguez on 11/21/22 by a audio enabled telemedicine application and verified that I am speaking with the correct person using two identifiers.  Patient Location: Home  Provider Location: Home Office  I discussed the limitations of evaluation and management by telemedicine. The patient expressed understanding and agreed to proceed.  Vital Signs: Because this visit was a virtual/telehealth visit, some criteria may be missing or patient reported. Any vitals not documented were not able to be obtained and vitals that have been documented are patient reported.    Cardiac Risk Factors include: advanced age (>25men, >30 women);male gender     Objective:    Today's Vitals   11/21/22 0842  PainSc: 8    There is no height or weight on file to calculate BMI.     11/21/2022    8:56 AM 11/18/2022    9:01 AM 03/26/2022   10:15 PM 03/26/2022    5:23 PM 09/11/2020    2:12 PM 06/06/2020    3:00 AM 12/10/2019   12:50 PM  Advanced Directives  Does Patient Have a Medical Advance Directive? No No No No No No No  Would patient like information on creating a medical advance directive?   No - Patient declined  No - Patient declined No - Patient declined No - Patient declined    Current Medications (verified) Outpatient Encounter Medications as of 11/21/2022  Medication Sig   albuterol (PROVENTIL) (2.5 MG/3ML) 0.083% nebulizer solution Take 3 mLs (2.5 mg total) by nebulization every 4 (four) hours as needed for wheezing or shortness of breath.   APIXABAN (ELIQUIS) VTE STARTER PACK (10MG  AND 5MG ) Take as directed on package: start with two-5mg  tablets twice daily for 7 days. On day 8, switch to one-5mg  tablet twice daily.   folic acid (FOLVITE) 1 MG tablet Take 1 tablet (1 mg total) by mouth daily.   furosemide (LASIX) 20 MG  tablet TAKE ONE TABLET (20MG ) BY MOUTH EVERY DAY (Patient taking differently: Take 20 mg by mouth daily.)   gabapentin (NEURONTIN) 400 MG capsule Take 400 mg by mouth 3 (three) times daily.   metoprolol succinate (TOPROL-XL) 50 MG 24 hr tablet Take 1 tablet (50 mg total) by mouth daily. Take with or immediately following a meal.   OXYGEN Inhale 3 L into the lungs continuous.    polyethylene glycol (MIRALAX / GLYCOLAX) 17 g packet Take 17 g by mouth daily. (Patient taking differently: Take 17 g by mouth daily as needed for mild constipation.)   senna (SENOKOT) 8.6 MG TABS tablet Take 1-2 tablets (8.6-17.2 mg total) by mouth daily.   simvastatin (ZOCOR) 20 MG tablet Take 1 tablet (20 mg total) by mouth daily.   spironolactone (ALDACTONE) 25 MG tablet TAKE 1 TABLET(25 MG) BY MOUTH DAILY (Patient taking differently: Take 25 mg by mouth daily.)   traZODone (DESYREL) 50 MG tablet TAKE 1 TABLET (50MG ) BY MOUTH AT BEDTIME (Patient taking differently: Take 50 mg by mouth at bedtime as needed for sleep.)   VENTOLIN HFA 108 (90 Base) MCG/ACT inhaler INHALE 1-2 PUFFS INTO THE LUNGS EVERY 6 HOURS AS NEEDED FOR WHEEZING OR SHORTNESS OF BREATH (Patient taking differently: Inhale 1-2 puffs into the lungs every 6 (six) hours as needed for wheezing or shortness of breath.)   aspirin EC 81 MG tablet Take 81 mg by mouth daily. Swallow  whole. (Patient not taking: Reported on 11/21/2022)   azithromycin (ZITHROMAX) 500 MG tablet Take 1 tablet by mouth once (Patient not taking: Reported on 11/21/2022)   Budeson-Glycopyrrol-Formoterol (BREZTRI AEROSPHERE) 160-9-4.8 MCG/ACT AERO Inhale 2 puffs into the lungs in the morning and at bedtime. (Patient not taking: Reported on 03/26/2022)   divalproex (DEPAKOTE SPRINKLE) 125 MG capsule TAKE TWO CAPSULES BY MOUTH TWICE DAILY (Patient not taking: Reported on 11/21/2022)   doxycycline (VIBRA-TABS) 100 MG tablet Take 1 tablet (100 mg total) by mouth 2 (two) times daily for 7 days.  (Patient not taking: Reported on 11/21/2022)   No facility-administered encounter medications on file as of 11/21/2022.    Allergies (verified) Patient has no known allergies.   History: Past Medical History:  Diagnosis Date   AF (atrial fibrillation) (HCC)    Alcohol abuse    Angina pectoris    CAD (coronary artery disease) 12/15/2004   COPD (chronic obstructive pulmonary disease) (HCC)    Coronary artery disease    Heavy cigarette smoker    HTN (hypertension)    Idiopathic cardiomyopathy (HCC) 07/16/2009   Non compliance with medical treatment    Seizures [facility])    Past Surgical History:  Procedure Laterality Date   CARDIAC CATHETERIZATION  2006   CARDIAC CATHETERIZATION  12/2004   Left    CARDIOVERSION  01/21/2011   Procedure: CARDIOVERSION;  Surgeon: Darden Palmer., MD;  Location[facility];  Service: Cardiovascular;  Laterality: N/A;   DOPPLER ECHOCARDIOGRAPHY  02/2011   ELECTROPHYSIOLOGY STUDY N/A 12/16/2011   Procedure: ELECTROPHYSIOLOGY STUDY;  Surgeon: Marinus Maw, MD;  Location: [facility] CATH LAB;  Service: Cardiovascular;  Laterality: N/A;   EP IMPLANTABLE DEVICE N/A 08/24/2015   Procedure: Loop Recorder Removal;  Surgeon: Marinus Maw, MD;  Location: [facility] INVASIVE CV LAB;  Service: Cardiovascular;  Laterality: N/A;   fx collar bone     NO PAST SURGERIES     Family History  Problem Relation Age of Onset   Suicidality Sister    CVA Mother    Stroke Mother    Heart disease Father    Coronary artery disease Father    COPD Father    Social History   Socioeconomic History   Marital status: Single    Spouse name: Not on file   Number of children: Not on file   Years of education: Not on file   Highest education level: Not on file  Occupational History   Occupation: Production manager: UNEMPLOYED  Tobacco Use   Smoking status: Every Day    Current packs/day: 1.00    Average packs/day: 1 pack/day for 58.0 years (58.0 ttl pk-yrs)    Types:  Cigarettes   Smokeless tobacco: Never   Tobacco comments:    pt states he is down to 1ppd 09/20/2020  Vaping Use   Vaping status: Never Used  Substance and Sexual Activity   Alcohol use: Not Currently    Comment: occ   Drug use: Yes    Types: Marijuana   Sexual activity: Not on file  Other Topics Concern   Not on file  Social History Narrative   ** Merged History Encounter **       Social Determinants of Health   Financial Resource Strain: Low Risk  (11/21/2022)   Overall Financial Resource Strain (CARDIA)    Difficulty of Paying Living Expenses: Not hard at all  Food Insecurity: No Food Insecurity (11/21/2022)   Hunger Vital Sign  Worried About Programme researcher, broadcasting/film/video in the Last Year: Never true    Ran Out of Food in the Last Year: Never true  Transportation Needs: No Transportation Needs (11/21/2022)   PRAPARE - Administrator, Civil Service (Medical): No    Lack of Transportation (Non-Medical): No  Physical Activity: Inactive (11/21/2022)   Exercise Vital Sign    Days of Exercise per Week: 0 days    Minutes of Exercise per Session: 0 min  Stress: No Stress Concern Present (11/21/2022)   [employer] of Occupational Health - Occupational Stress Questionnaire    Feeling of Stress : Only a little  Social Connections: Socially Isolated (11/21/2022)   Social Connection and Isolation Panel [NHANES]    Frequency of Communication with Friends and Family: Never    Frequency of Social Gatherings with Friends and Family: Never    Attends Religious Services: Never    Database administrator or Organizations: No    Attends Engineer, structural: Never    Marital Status: Never married    Tobacco Counseling Ready to quit: Yes Counseling given: Not Answered Tobacco comments: pt states he is down to 1ppd 09/20/2020   Clinical Intake:  Pre-visit preparation completed: Yes  Pain : 0-10 Pain Score: 8  Pain Type: Chronic pain Pain Location: Abdomen Pain  Descriptors / Indicators: Aching Pain Onset: More than a month ago Pain Frequency: Constant     Nutritional Status: BMI 25 -29 Overweight Nutritional Risks: None Diabetes: No  How often do you need to have someone help you when you read instructions, pamphlets, or other written materials from your doctor or pharmacy?: 1 - Never  Interpreter Needed?: No  Information entered by :: NAllen LPN   Activities of Daily Living    11/21/2022    8:45 AM 03/26/2022   10:15 PM  In your present state of health, do you have any difficulty performing the following activities:  Hearing? 0   Vision? 0   Difficulty concentrating or making decisions? 0   Walking or climbing stairs? 1 1  Dressing or bathing? 1 0  Comment takes time   Doing errands, shopping? 0   Preparing Food and eating ? N   Using the Toilet? N   In the past six months, have you accidently leaked urine? Y   Comment on fluid pills   Do you have problems with loss of bowel control? N   Managing your Medications? N   Managing your Finances? N   Housekeeping or managing your Housekeeping? N     Patient Care Team: Rema Fendt, NP as PCP - General (Nurse Practitioner) Marinus Maw, MD as PCP - Electrophysiology (Cardiology) Gwenyth Bender, MD (Internal Medicine)  Indicate any recent Medical Services you may have received from other than [facility] providers in the past year (date may be approximate).     Assessment:   This is a routine wellness examination for Johnny Rodriguez.  Hearing/Vision screen Hearing Screening - Comments:: Denies hearing issues Vision Screening - Comments:: No regular eye exams   Goals Addressed             This Visit's Progress    Patient Stated       11/21/2022, stay healthy       Depression Screen    11/21/2022    8:58 AM 08/15/2022    1:42 PM 01/25/2021    8:17 AM 09/25/2020    2:42 PM 09/11/2020    2:02 PM  08/07/2020    4:25 PM 06/20/2020   10:13 AM  PHQ 2/9 Scores  PHQ - 2 Score 0 0 0  3 2 3  0  PHQ- 9 Score  0  11 12 6    Exception Documentation  Patient refusal         Fall Risk    11/21/2022    8:57 AM 08/15/2022    1:42 PM 09/11/2020    2:12 PM 06/20/2020   10:13 AM  Fall Risk   Falls in the past year? 0 0 0 1  Number falls in past yr: 0 0 0 0  Injury with Fall? 0 0 0 0  Risk for fall due to : Impaired mobility;Impaired balance/gait;Medication side effect No Fall Risks Impaired mobility;Impaired balance/gait No Fall Risks  Follow up Falls prevention discussed;Falls evaluation completed  Education provided Falls evaluation completed    MEDICARE RISK AT HOME: Medicare Risk at Home Any stairs in or around the home?: No If so, are there any without handrails?: No Home free of loose throw rugs in walkways, pet beds, electrical cords, etc?: Yes Adequate lighting in your home to reduce risk of falls?: Yes Life alert?: No Use of a cane, walker or w/c?: Yes Grab bars in the bathroom?: Yes Shower chair or bench in shower?: Yes Elevated toilet seat or a handicapped toilet?: Yes  TIMED UP AND GO:  Was the test performed?  No    Cognitive Function:        11/21/2022    8:58 AM  6CIT Screen  What Year? 4 points  What month? 0 points  What time? 0 points  Count back from 20 0 points  Months in reverse 4 points  Repeat phrase 0 points  Total Score 8 points    Immunizations Immunization History  Administered Date(s) Administered   Influenza,inj,Quad PF,6+ Mos 02/09/2015, 02/17/2016   Pneumococcal Polysaccharide-23 07/06/2015, 02/17/2016   Tdap 05/16/2014    TDAP status: Up to date  Flu Vaccine status: Due, Education has been provided regarding the importance of this vaccine. Advised may receive this vaccine at local pharmacy or Health Dept. Aware to provide a copy of the vaccination record if obtained from local pharmacy or Health Dept. Verbalized acceptance and understanding.  Pneumococcal vaccine status: Due, Education has been provided regarding the  importance of this vaccine. Advised may receive this vaccine at local pharmacy or Health Dept. Aware to provide a copy of the vaccination record if obtained from local pharmacy or Health Dept. Verbalized acceptance and understanding.  Covid-19 vaccine status: Information provided on how to obtain vaccines.   Qualifies for Shingles Vaccine? Yes   Zostavax completed No   Shingrix Completed?: No.    Education has been provided regarding the importance of this vaccine. Patient has been advised to call insurance company to determine out of pocket expense if they have not yet received this vaccine. Advised may also receive vaccine at local pharmacy or Health Dept. Verbalized acceptance and understanding.  Screening Tests Health Maintenance  Topic Date Due   Colonoscopy  Never done   Zoster Vaccines- Shingrix (1 of 2) Never done   Pneumonia Vaccine 9+ Years old (2 of 2 - PCV) 02/16/2017   INFLUENZA VACCINE  09/11/2022   COVID-19 Vaccine (1 - 2023-24 season) Never done   Lung Cancer Screening  03/28/2023   Medicare Annual Wellness (AWV)  11/21/2023   DTaP/Tdap/Td (2 - Td or Tdap) 05/15/2024   Hepatitis C Screening  Completed   HPV VACCINES  Aged Out    Health Maintenance  Health Maintenance Due  Topic Date Due   Colonoscopy  Never done   Zoster Vaccines- Shingrix (1 of 2) Never done   Pneumonia Vaccine 75+ Years old (2 of 2 - PCV) 02/16/2017   INFLUENZA VACCINE  09/11/2022   COVID-19 Vaccine (1 - 2023-24 season) Never done    Colorectal cancer screening: declines at this time  Lung Cancer Screening: (Low Dose CT Chest recommended if Age 62-80 years, 20 pack-year currently smoking OR have quit w/in 15years.) does qualify.   Lung Cancer Screening Referral: CT scan 03/27/2022  Additional Screening:  Hepatitis C Screening: does qualify; Completed 03/11/2019  Vision Screening: Recommended annual ophthalmology exams for early detection of glaucoma and other disorders of the eye. Is the  patient up to date with their annual eye exam?  No  Who is the provider or what is the name of the office in which the patient attends annual eye exams? none If pt is not established with a provider, would they like to be referred to a provider to establish care? No .   Dental Screening: Recommended annual dental exams for proper oral hygiene  Diabetic Foot Exam: n/a  Community Resource Referral / Chronic Care Management: CRR required this visit?  No   CCM required this visit?  No     Plan:     I have personally reviewed and noted the following in the patient's chart:   Medical and social history Use of alcohol, tobacco or illicit drugs  Current medications and supplements including opioid prescriptions. Patient is not currently taking opioid prescriptions. Functional ability and status Nutritional status Physical activity Advanced directives List of other physicians Hospitalizations, surgeries, and ER visits in previous 12 months Vitals Screenings to include cognitive, depression, and falls Referrals and appointments  In addition, I have reviewed and discussed with patient certain preventive protocols, quality metrics, and best practice recommendations. A written personalized care plan for preventive services as well as general preventive health recommendations were provided to patient.     Barb Merino, LPN   16/11/9602   After Visit Summary: (Pick Up) Due to this being a telephonic visit, with patients personalized plan was offered to patient and patient has requested to Pick up at office.  Nurse Notes: none

## 2022-11-24 ENCOUNTER — Ambulatory Visit: Payer: Medicare HMO | Admitting: Physician Assistant

## 2022-11-24 NOTE — Patient Instructions (Signed)
Visit Information  Thank you for taking time to visit with me today. Please don't hesitate to contact me if I can be of assistance to you.   Following are the goals we discussed today:   Goals Addressed             This Visit's Progress    LCSW Obtain Supportive Resources-Aid   On track    Activities and task to complete in order to accomplish goals.   Keep all upcoming appointments discussed today Continue with compliance of taking medication prescribed by Doctor Implement healthy coping skills discussed to assist with management of symptoms F/up with Medicaid to register for transportation          Please call the care guide team at [phone] if you need to cancel or reschedule your appointment.   If you are experiencing a Mental Health or Behavioral Health Crisis or need someone to talk to, please call the Suicide and Crisis Lifeline: 988 call 911   The patient verbalized understanding of instructions, educational materials, and care plan provided today and DECLINED offer to receive copy of patient instructions, educational materials, and care plan.   Jenel Lucks, MSW, LCSW [facility] Care Management [place]  [facility] [email or web address] Phone [phone] 6:27 PM

## 2022-11-24 NOTE — Patient Outreach (Signed)
Care Coordination   Follow Up Visit Note   11/21/2022 Name: Johnny Rodriguez MRN: [id number] DOB: Jun 26, 1950  Johnny Rodriguez is a 72 y.o. year old male who sees Rema Fendt, NP for primary care. I spoke with  Johnny Rodriguez by phone today.  What matters to the patients health and wellness today?  Transportation, DME    Goals Addressed             This Visit's Progress    LCSW Obtain Supportive Resources-Aid   On track    Activities and task to complete in order to accomplish goals.   Keep all upcoming appointments discussed today Continue with compliance of taking medication prescribed by Doctor Implement healthy coping skills discussed to assist with management of symptoms F/up with Medicaid to register for transportation         SDOH assessments and interventions completed:  No     Care Coordination Interventions:  Yes, provided  Interventions Today    Flowsheet Row Most Recent Value  Chronic Disease   Chronic disease during today's visit Atrial Fibrillation (AFib), Chronic Obstructive Pulmonary Disease (COPD)  General Interventions   General Interventions Discussed/Reviewed General Interventions Reviewed, [facility], Doctor Visits  [Patient reports that he is not feeling well. Oxygen concerns were resolved. Identified barriers that small tanks run out of oxygen due to extended periods of time traveling via wheelchair, frustrated with [place] transportation]  Doctor Visits Discussed/Reviewed Doctor Visits Reviewed  Mental Health Interventions   Mental Health Discussed/Reviewed Mental Health Reviewed, Coping Strategies, Anxiety, Depression  Nutrition Interventions   Nutrition Discussed/Reviewed Nutrition Reviewed  Pharmacy Interventions   Pharmacy Dicussed/Reviewed Pharmacy Topics Reviewed, Medication Adherence  Safety Interventions   Safety Discussed/Reviewed Safety Reviewed       Follow up plan: Follow up call scheduled for 2-4 weeks     Encounter Outcome:  Patient Visit Completed   Jenel Lucks, MSW, LCSW [facility] Care Management [facility]  [facility] [email or web address] Phone [phone] 6:27 PM

## 2022-11-25 NOTE — Telephone Encounter (Signed)
I was able to reach Ellene Route, [employer]. She confirmed that she will be taking the patient to his GI consult on 01/02/2023 and then will accompany him to the colonoscopy when that is scheduled.  She asked that I email her upcoming appointment information: [email or web address]   She also said that the patient will be moving into permanent housing shortly.  - [address]  She did not have an exact date but stated that it will be soon.  She also said that they did a walk through at the apartment and it is wheelchair accessible.   Regarding PCS, she said he has not heard from anyone. I explained that the referral was sent to [facility] and she asked that I send her their phone number and she will follow up.  He now has [employer] and I am not sure if that insurance change has affected his PCS referral.  If [employer] needs the referral, it can be emailed to  [email or web address].  The PCS request form was sent to Curley Spice, CMA for Ricky Stabs, NP to sign.

## 2022-11-29 DIAGNOSIS — J9611 Chronic respiratory failure with hypoxia: Secondary | ICD-10-CM | POA: Diagnosis not present

## 2022-11-29 DIAGNOSIS — G894 Chronic pain syndrome: Secondary | ICD-10-CM | POA: Diagnosis not present

## 2022-11-29 DIAGNOSIS — I4891 Unspecified atrial fibrillation: Secondary | ICD-10-CM | POA: Diagnosis not present

## 2022-11-29 DIAGNOSIS — K5900 Constipation, unspecified: Secondary | ICD-10-CM | POA: Diagnosis not present

## 2022-12-01 ENCOUNTER — Encounter: Payer: Medicare HMO | Admitting: Family

## 2022-12-01 NOTE — Progress Notes (Signed)
Erroneous encounter-disregard

## 2022-12-03 DIAGNOSIS — Z9981 Dependence on supplemental oxygen: Secondary | ICD-10-CM | POA: Diagnosis not present

## 2022-12-03 DIAGNOSIS — I2510 Atherosclerotic heart disease of native coronary artery without angina pectoris: Secondary | ICD-10-CM | POA: Diagnosis not present

## 2022-12-03 DIAGNOSIS — Z9181 History of falling: Secondary | ICD-10-CM | POA: Diagnosis not present

## 2022-12-03 DIAGNOSIS — I509 Heart failure, unspecified: Secondary | ICD-10-CM | POA: Diagnosis not present

## 2022-12-03 DIAGNOSIS — J4489 Other specified chronic obstructive pulmonary disease: Secondary | ICD-10-CM | POA: Diagnosis not present

## 2022-12-05 ENCOUNTER — Encounter (HOSPITAL_COMMUNITY): Payer: Self-pay

## 2022-12-05 ENCOUNTER — Inpatient Hospital Stay (HOSPITAL_COMMUNITY)
Admission: EM | Admit: 2022-12-05 | Discharge: 2022-12-08 | DRG: 190 | Disposition: A | Discharge status: Home Health | Payer: Medicare HMO | Attending: Internal Medicine | Admitting: Internal Medicine

## 2022-12-05 ENCOUNTER — Emergency Department (HOSPITAL_COMMUNITY): Payer: Medicare HMO

## 2022-12-05 DIAGNOSIS — J449 Chronic obstructive pulmonary disease, unspecified: Secondary | ICD-10-CM

## 2022-12-05 DIAGNOSIS — I5022 Chronic systolic (congestive) heart failure: Secondary | ICD-10-CM | POA: Diagnosis present

## 2022-12-05 DIAGNOSIS — S2242XA Multiple fractures of ribs, left side, initial encounter for closed fracture: Secondary | ICD-10-CM | POA: Diagnosis not present

## 2022-12-05 DIAGNOSIS — Z825 Family history of asthma and other chronic lower respiratory diseases: Secondary | ICD-10-CM | POA: Diagnosis not present

## 2022-12-05 DIAGNOSIS — J9621 Acute and chronic respiratory failure with hypoxia: Secondary | ICD-10-CM | POA: Diagnosis present

## 2022-12-05 DIAGNOSIS — T380X5A Adverse effect of glucocorticoids and synthetic analogues, initial encounter: Secondary | ICD-10-CM | POA: Diagnosis present

## 2022-12-05 DIAGNOSIS — F1210 Cannabis abuse, uncomplicated: Secondary | ICD-10-CM | POA: Diagnosis present

## 2022-12-05 DIAGNOSIS — Z823 Family history of stroke: Secondary | ICD-10-CM | POA: Diagnosis not present

## 2022-12-05 DIAGNOSIS — D72829 Elevated white blood cell count, unspecified: Secondary | ICD-10-CM | POA: Diagnosis present

## 2022-12-05 DIAGNOSIS — Z1152 Encounter for screening for COVID-19: Secondary | ICD-10-CM

## 2022-12-05 DIAGNOSIS — I700 Atherosclerosis of aorta: Secondary | ICD-10-CM | POA: Diagnosis not present

## 2022-12-05 DIAGNOSIS — I4891 Unspecified atrial fibrillation: Secondary | ICD-10-CM | POA: Diagnosis present

## 2022-12-05 DIAGNOSIS — K219 Gastro-esophageal reflux disease without esophagitis: Secondary | ICD-10-CM | POA: Diagnosis present

## 2022-12-05 DIAGNOSIS — Z8249 Family history of ischemic heart disease and other diseases of the circulatory system: Secondary | ICD-10-CM

## 2022-12-05 DIAGNOSIS — I10 Essential (primary) hypertension: Secondary | ICD-10-CM | POA: Diagnosis not present

## 2022-12-05 DIAGNOSIS — J4489 Other specified chronic obstructive pulmonary disease: Secondary | ICD-10-CM | POA: Diagnosis not present

## 2022-12-05 DIAGNOSIS — J9601 Acute respiratory failure with hypoxia: Secondary | ICD-10-CM

## 2022-12-05 DIAGNOSIS — Z86711 Personal history of pulmonary embolism: Secondary | ICD-10-CM | POA: Diagnosis not present

## 2022-12-05 DIAGNOSIS — G894 Chronic pain syndrome: Secondary | ICD-10-CM | POA: Diagnosis present

## 2022-12-05 DIAGNOSIS — I110 Hypertensive heart disease with heart failure: Secondary | ICD-10-CM | POA: Diagnosis present

## 2022-12-05 DIAGNOSIS — G4700 Insomnia, unspecified: Secondary | ICD-10-CM

## 2022-12-05 DIAGNOSIS — R0602 Shortness of breath: Secondary | ICD-10-CM | POA: Diagnosis not present

## 2022-12-05 DIAGNOSIS — R062 Wheezing: Secondary | ICD-10-CM | POA: Diagnosis not present

## 2022-12-05 DIAGNOSIS — I480 Paroxysmal atrial fibrillation: Secondary | ICD-10-CM | POA: Diagnosis present

## 2022-12-05 DIAGNOSIS — F17210 Nicotine dependence, cigarettes, uncomplicated: Secondary | ICD-10-CM | POA: Diagnosis present

## 2022-12-05 DIAGNOSIS — I5042 Chronic combined systolic (congestive) and diastolic (congestive) heart failure: Secondary | ICD-10-CM | POA: Diagnosis present

## 2022-12-05 DIAGNOSIS — J441 Chronic obstructive pulmonary disease with (acute) exacerbation: Principal | ICD-10-CM | POA: Diagnosis present

## 2022-12-05 DIAGNOSIS — J9622 Acute and chronic respiratory failure with hypercapnia: Secondary | ICD-10-CM | POA: Diagnosis present

## 2022-12-05 DIAGNOSIS — I2510 Atherosclerotic heart disease of native coronary artery without angina pectoris: Secondary | ICD-10-CM | POA: Diagnosis present

## 2022-12-05 DIAGNOSIS — Z9981 Dependence on supplemental oxygen: Secondary | ICD-10-CM

## 2022-12-05 DIAGNOSIS — Z66 Do not resuscitate: Secondary | ICD-10-CM | POA: Diagnosis present

## 2022-12-05 DIAGNOSIS — J439 Emphysema, unspecified: Secondary | ICD-10-CM | POA: Diagnosis present

## 2022-12-05 DIAGNOSIS — R000 Tachycardia, unspecified: Secondary | ICD-10-CM | POA: Diagnosis not present

## 2022-12-05 DIAGNOSIS — Z7901 Long term (current) use of anticoagulants: Secondary | ICD-10-CM | POA: Diagnosis not present

## 2022-12-05 DIAGNOSIS — Z7982 Long term (current) use of aspirin: Secondary | ICD-10-CM

## 2022-12-05 DIAGNOSIS — R0609 Other forms of dyspnea: Secondary | ICD-10-CM

## 2022-12-05 DIAGNOSIS — Z79899 Other long term (current) drug therapy: Secondary | ICD-10-CM

## 2022-12-05 DIAGNOSIS — R5381 Other malaise: Secondary | ICD-10-CM | POA: Diagnosis present

## 2022-12-05 LAB — BASIC METABOLIC PANEL
Anion gap: 13 (ref 5–15)
BUN: 17 mg/dL (ref 8–23)
CO2: 30 mmol/L (ref 22–32)
Calcium: 9.3 mg/dL (ref 8.9–10.3)
Chloride: 93 mmol/L — ABNORMAL LOW (ref 98–111)
Creatinine, Ser: 1.14 mg/dL (ref 0.61–1.24)
GFR, Estimated: >60 mL/min (ref >60)
Glucose, Bld: 115 mg/dL — ABNORMAL HIGH (ref 70–99)
Potassium: 4.5 mmol/L (ref 3.5–5.1)
Sodium: 136 mmol/L (ref 135–145)

## 2022-12-05 LAB — CBC
HCT: 48.1 % (ref 39.0–52.0)
Hemoglobin: 15.3 g/dL (ref 13.0–17.0)
MCH: 29.7 pg (ref 26.0–34.0)
MCHC: 31.8 g/dL (ref 30.0–36.0)
MCV: 93.2 fL (ref 80.0–100.0)
Platelets: 204 10*3/uL (ref 150–400)
RBC: 5.16 MIL/uL (ref 4.22–5.81)
RDW: 13 % (ref 11.5–15.5)
WBC: 8.7 10*3/uL (ref 4.0–10.5)
nRBC: 0 % (ref 0.0–0.2)

## 2022-12-05 LAB — I-STAT VENOUS BLOOD GAS, ED
Acid-Base Excess: 8 mmol/L — ABNORMAL HIGH (ref 0.0–2.0)
Bicarbonate: 37.6 mmol/L — ABNORMAL HIGH (ref 20.0–28.0)
Calcium, Ion: 1.06 mmol/L — ABNORMAL LOW (ref 1.15–1.40)
HCT: 50 % (ref 39.0–52.0)
Hemoglobin: 17 g/dL (ref 13.0–17.0)
O2 Saturation: 86 %
Potassium: 6.1 mmol/L — ABNORMAL HIGH (ref 3.5–5.1)
Sodium: 133 mmol/L — ABNORMAL LOW (ref 135–145)
TCO2: 40 mmol/L — ABNORMAL HIGH (ref 22–32)
pCO2, Ven: 74.5 mm[Hg] (ref 44–60)
pH, Ven: 7.311 (ref 7.25–7.43)
pO2, Ven: 59 mm[Hg] — ABNORMAL HIGH (ref 32–45)

## 2022-12-05 LAB — BRAIN NATRIURETIC PEPTIDE: B Natriuretic Peptide: 39.2 pg/mL (ref 0.0–100.0)

## 2022-12-05 MED ORDER — METHYLPREDNISOLONE SODIUM SUCC 125 MG IJ SOLR
120.0000 mg | INTRAMUSCULAR | Status: DC
Start: 2022-12-06 — End: 2022-12-06
  Administered 2022-12-06: 120 mg via INTRAVENOUS
  Filled 2022-12-05: qty 2

## 2022-12-05 MED ORDER — METOPROLOL SUCCINATE ER 50 MG PO TB24
50.0000 mg | ORAL_TABLET | Freq: Every day | ORAL | Status: DC
Start: 2022-12-06 — End: 2022-12-08
  Administered 2022-12-06 – 2022-12-08 (×3): 50 mg via ORAL
  Filled 2022-12-05: qty 1
  Filled 2022-12-05: qty 2
  Filled 2022-12-05: qty 1

## 2022-12-05 MED ORDER — APIXABAN 5 MG PO TABS
5.0000 mg | ORAL_TABLET | Freq: Two times a day (BID) | ORAL | Status: DC
  Administered 2022-12-06 – 2022-12-08 (×6): 5 mg via ORAL
  Filled 2022-12-05 (×6): qty 1

## 2022-12-05 MED ORDER — IPRATROPIUM-ALBUTEROL 0.5-2.5 (3) MG/3ML IN SOLN: 3.0000 mL | RESPIRATORY_TRACT | Status: DC

## 2022-12-05 MED ORDER — SODIUM CHLORIDE 0.9 % IV SOLN
500.0000 mg | Freq: Once | INTRAVENOUS | Status: AC
  Administered 2022-12-05: 500 mg via INTRAVENOUS
  Filled 2022-12-05: qty 5

## 2022-12-05 MED ORDER — BUDESONIDE 0.25 MG/2ML IN SUSP
0.2500 mg | Freq: Two times a day (BID) | RESPIRATORY_TRACT | Status: DC
  Administered 2022-12-06 – 2022-12-08 (×6): 0.25 mg via RESPIRATORY_TRACT
  Filled 2022-12-05 (×6): qty 2

## 2022-12-05 MED ORDER — ACETAMINOPHEN 650 MG RE SUPP: 650.0000 mg | Freq: Four times a day (QID) | RECTAL | Status: DC | PRN

## 2022-12-05 MED ORDER — AZITHROMYCIN 500 MG PO TABS
250.0000 mg | ORAL_TABLET | Freq: Every day | ORAL | Status: AC
  Administered 2022-12-06 – 2022-12-08 (×3): 250 mg via ORAL
  Filled 2022-12-05 (×3): qty 1

## 2022-12-05 MED ORDER — DILTIAZEM HCL 25 MG/5ML IV SOLN
20.0000 mg | Freq: Once | INTRAVENOUS | Status: AC
  Administered 2022-12-05: 20 mg via INTRAVENOUS
  Filled 2022-12-05: qty 5

## 2022-12-05 MED ORDER — ACETAMINOPHEN 325 MG PO TABS
650.0000 mg | ORAL_TABLET | Freq: Four times a day (QID) | ORAL | Status: DC | PRN
  Administered 2022-12-07: 650 mg via ORAL
  Filled 2022-12-05: qty 2

## 2022-12-05 MED ORDER — MAGNESIUM SULFATE 2 GM/50ML IV SOLN
2.0000 g | Freq: Once | INTRAVENOUS | Status: AC
  Administered 2022-12-05: 2 g via INTRAVENOUS
  Filled 2022-12-05: qty 50

## 2022-12-05 MED ORDER — GABAPENTIN 400 MG PO CAPS
400.0000 mg | ORAL_CAPSULE | Freq: Three times a day (TID) | ORAL | Status: DC
  Administered 2022-12-06 – 2022-12-08 (×8): 400 mg via ORAL
  Filled 2022-12-05 (×8): qty 1

## 2022-12-05 MED ORDER — IPRATROPIUM BROMIDE 0.02 % IN SOLN
0.5000 mg | Freq: Three times a day (TID) | RESPIRATORY_TRACT | Status: DC
  Administered 2022-12-06 (×4): 0.5 mg via RESPIRATORY_TRACT
  Filled 2022-12-05 (×4): qty 2.5

## 2022-12-05 MED ORDER — DM-GUAIFENESIN ER 30-600 MG PO TB12: 1.0000 | ORAL_TABLET | Freq: Two times a day (BID) | ORAL | Status: DC | PRN

## 2022-12-05 MED ORDER — ALBUTEROL SULFATE HFA 108 (90 BASE) MCG/ACT IN AERS: 2.0000 | INHALATION_SPRAY | RESPIRATORY_TRACT | Status: DC | PRN

## 2022-12-05 MED ORDER — ASPIRIN 81 MG PO TBEC
81.0000 mg | DELAYED_RELEASE_TABLET | Freq: Every day | ORAL | Status: DC
  Administered 2022-12-06 – 2022-12-08 (×3): 81 mg via ORAL
  Filled 2022-12-05 (×3): qty 1

## 2022-12-05 MED ORDER — LEVALBUTEROL HCL 0.63 MG/3ML IN NEBU
0.6300 mg | INHALATION_SOLUTION | Freq: Three times a day (TID) | RESPIRATORY_TRACT | Status: DC
  Administered 2022-12-06 (×4): 0.63 mg via RESPIRATORY_TRACT
  Filled 2022-12-05 (×4): qty 3

## 2022-12-05 NOTE — ED Provider Notes (Signed)
[place] EMERGENCY DEPARTMENT AT [facility] Provider Note  CSN: [id number] Arrival date & time: 12/05/22 1801  Chief Complaint(s) Shortness of Breath  HPI Johnny Rodriguez is a 72 y.o. male with a history of COPD who is here for shortness of breath and cough.  Symptoms began yesterday.  He also has a history of atrial fibrillation, is compliant with his Eliquis.  Patient is chronically on home O2, however he says that he ran out of his oxygen.  Denies fever.   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   Pulmonary embolism (HCC) 03/27/2022   Dyspnea secondary to COPD 03/26/2022   Hypocalcemia    Hypokalemia    Atrial fibrillation with RVR (HCC) 06/05/2020   COPD with acute exacerbation (HCC) 03/11/2019   AKI (acute kidney injury) (HCC) 03/10/2019   Bowel obstruction (HCC) 03/10/2019   Chronic respiratory failure with hypoxia (HCC) 10/15/2016   Cigarette smoker 09/09/2016   Long term current use of anticoagulant therapy 06/24/2016   History of syncope 06/24/2016   COPD exacerbation (HCC) 02/08/2015   Essential hypertension 02/08/2015   Syncope 12/10/2011   History of noncompliance with medical treatment 12/01/2011   Chronic systolic heart failure (HCC) 04/10/2011   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 Authorizing Provider  albuterol (PROVENTIL) (2.5 MG/3ML) 0.083% nebulizer solution Take 3 mLs (2.5 mg total) by nebulization every 4 (four) hours as needed for wheezing or shortness of breath. 10/07/21   Martina Sinner, MD  APIXABAN Everlene Balls) VTE STARTER PACK (10MG  AND 5MG ) Take as directed on package: start with two-5mg  tablets twice daily for 7 days. On day 8, switch to one-5mg  tablet twice daily. 03/28/22   Bess Kinds, MD  aspirin EC 81 MG tablet Take 81 mg by mouth daily. Swallow whole. Patient not taking: Reported on 11/21/2022    [provider]  azithromycin (ZITHROMAX) 500 MG tablet Take 1 tablet by mouth once Patient not taking: Reported on 11/21/2022 03/29/22   Bess Kinds, MD  Budeson-Glycopyrrol-Formoterol (BREZTRI AEROSPHERE) 160-9-4.8 MCG/ACT AERO Inhale 2 puffs into the lungs in the morning and at bedtime. Patient not taking: Reported on 03/26/2022 10/07/21   Martina Sinner, MD  divalproex (DEPAKOTE SPRINKLE) 125 MG capsule TAKE TWO CAPSULES BY MOUTH TWICE DAILY Patient not taking: Reported on 11/21/2022 03/06/21   Rema Fendt, NP  doxycycline (VIBRA-TABS) 100 MG tablet Take 1 tablet (100 mg total) by mouth 2 (two) times daily for 7 days. Patient not taking: Reported on 11/21/2022 11/17/22   Rolan Bucco, MD  folic acid (FOLVITE) 1 MG tablet Take 1 tablet (1 mg total) by mouth daily. 03/27/22   Bess Kinds, MD  furosemide (LASIX) 20 MG tablet TAKE ONE TABLET (20MG ) BY MOUTH EVERY DAY Patient taking differently:  Take 20 mg by mouth daily. 06/10/21   Rema Fendt, NP  gabapentin (NEURONTIN) 400 MG capsule Take 400 mg by mouth 3 (three) times daily.    [provider]  metoprolol succinate (TOPROL-XL) 50 MG 24 hr tablet Take 1 tablet (50 mg total) by mouth daily. Take with or immediately following a meal. 06/03/21   Rema Fendt, NP  OXYGEN Inhale 3 L into the lungs continuous.     [provider]  polyethylene glycol (MIRALAX / GLYCOLAX) 17 g packet Take 17 g by mouth daily. Patient taking differently: Take 17 g by mouth daily as needed for mild constipation. 03/15/19   Dellia Cloud, MD  senna (SENOKOT) 8.6 MG TABS tablet Take 1-2 tablets (8.6-17.2 mg total) by mouth daily. 03/15/19   Dellia Cloud, MD  simvastatin (ZOCOR) 20 MG tablet Take 1 tablet (20 mg total) by mouth daily. 06/27/20   Rema Fendt, NP  spironolactone (ALDACTONE) 25 MG tablet TAKE 1 TABLET(25 MG) BY MOUTH DAILY Patient taking differently: Take 25 mg by mouth daily. 10/08/20   Rema Fendt, NP  traZODone (DESYREL) 50 MG tablet TAKE 1 TABLET (50MG ) BY MOUTH AT BEDTIME Patient taking differently: Take 50 mg by mouth at bedtime as needed for sleep. 06/10/21   Rema Fendt, NP  VENTOLIN HFA 108 (90 Base) MCG/ACT inhaler INHALE 1-2 PUFFS INTO THE LUNGS EVERY 6 HOURS AS NEEDED FOR WHEEZING OR SHORTNESS OF BREATH Patient taking differently: Inhale 1-2 puffs into the lungs every 6 (six) hours as needed for wheezing or shortness of breath. 10/07/21   Martina Sinner, MD                                                                                                                                    Past Surgical History Past Surgical History:  Procedure Laterality Date   CARDIAC CATHETERIZATION  2006   CARDIAC CATHETERIZATION  12/2004   Left    CARDIOVERSION  01/21/2011   Procedure: CARDIOVERSION;  Surgeon: Darden Palmer., MD;  Location: [facility] OR;  Service: Cardiovascular;  Laterality: N/A;   DOPPLER ECHOCARDIOGRAPHY  02/2011   ELECTROPHYSIOLOGY STUDY N/A 12/16/2011   Procedure: ELECTROPHYSIOLOGY STUDY;  Surgeon: Marinus Maw, MD;  Location: [facility] CATH LAB;  Service: Cardiovascular;  Laterality: N/A;   EP IMPLANTABLE DEVICE N/A 08/24/2015   Procedure: Loop Recorder Removal;  Surgeon: Marinus Maw, MD;  Location: [facility] INVASIVE CV LAB;  Service: Cardiovascular;  Laterality: N/A;   fx collar bone     NO PAST SURGERIES      Family History Family History  Problem Relation Age of Onset   Suicidality Sister    CVA Mother    Stroke Mother    Heart disease Father    Coronary artery disease Father    COPD Father     Social History Social History   Tobacco Use  Smoking status: Every Day    Current packs/day: 1.00    Average packs/day: 1 pack/day for 58.0 years (58.0 ttl pk-yrs)    Types: Cigarettes   Smokeless tobacco: Never   Tobacco comments:    pt states he is down to 1ppd 09/20/2020  Vaping Use   Vaping status: Never Used  Substance Use Topics   Alcohol use: Not Currently    Comment: occ   Drug use: Yes    Types: Marijuana   Allergies Patient has no known allergies.  Review of Systems Review of Systems  Physical Exam Vital Signs  I have reviewed the triage vital signs BP (!) 144/100 (BP Location: Right Arm)   Pulse (!) 47   Temp 98.5 F (36.9 C)   Resp (!) 30   SpO2 97%   Physical Exam  ED Results and Treatments Labs (all labs ordered are listed, but only abnormal results are displayed) Labs Reviewed  RESP PANEL BY RT-PCR (RSV, FLU A&B, COVID)  RVPGX2  BASIC METABOLIC PANEL  CBC  I-STAT VENOUS BLOOD GAS, ED                                                                                                                          Radiology No results found.  Pertinent labs & imaging results that were available during my care of the patient were reviewed by me and considered in my medical decision making (see MDM for details).  Medications Ordered in ED Medications  albuterol (VENTOLIN HFA) 108 (90 Base) MCG/ACT inhaler 2 puff (has no administration in time range)  ipratropium-albuterol (DUONEB) 0.5-2.5 (3) MG/3ML nebulizer solution 3 mL (has no administration in time range)  magnesium sulfate IVPB 2 g 50 mL (has no administration in time range)  azithromycin (ZITHROMAX) 500 mg in sodium chloride 0.9 % 250 mL IVPB (has no administration in time range)                                                                                                                                      Procedures .Critical Care  Performed by: Arletha Pili, DO Authorized by: Arletha Pili, DO   Critical care provider statement:    Critical care time (minutes):  30   Critical care was necessary to treat or prevent imminent or life-threatening deterioration of the following conditions:  Respiratory failure   Critical care  was time spent personally by me on the following activities:  Development of treatment plan with patient or surrogate, discussions with consultants, evaluation of patient's response to treatment, examination of patient, ordering and review of laboratory studies, ordering and review of radiographic studies, ordering and performing treatments and interventions, pulse oximetry, re-evaluation of patient's condition and review of old charts   I assumed direction of critical care for this patient from another provider in my specialty: yes     Care discussed with: admitting provider     (including critical care time)  Medical Decision Making / ED Course   This patient presents to the ED for concern of shortness of breath, this involves an extensive number of treatment options, and is a complaint that carries with it a high risk of complications and morbidity.  The differential diagnosis includes COPD exacerbation, pneumonia, pneumothorax, PE.  MDM: 72 year old male here today for shortness of breath.  Appears to be a COPD exacerbation.  Wheezing bilaterally.  Patient able speak in a few word sentences.  Will aggressively treat the patient upfront with noninvasive ventilation, continuous albuterol.  Magnesium ordered.  Anticipate admission for the patient.  Reassessment 8:15 PM-I was able to wean the patient off of BiPAP.  He is now able to speak in several word sentences.  Patient still having some increased work of breathing, on 4 L of oxygen.  Patient requires  admission for COPD exacerbation.  He has received azithromycin, magnesium, steroids via EMS.  Patient did begin to have atrial fibrillation in the room.  May be in response to all of the beta agonist she has received.  Did give the patient dose of diltiazem.  He is now rate controlled.  Patient is already on anticoagulants.  Additional history obtained:  -External records from outside source obtained and reviewed including: Chart review including previous notes, labs, imaging, consultation notes   Lab Tests: -I ordered, reviewed, and interpreted labs.   The pertinent results include:   Labs Reviewed  RESP PANEL BY RT-PCR (RSV, FLU A&B, COVID)  RVPGX2  BASIC METABOLIC PANEL  CBC  I-STAT VENOUS BLOOD GAS, ED      EKG my dependent review the patient's EKG shows atrial fibrillation.  EKG Interpretation Date/Time:    Ventricular Rate:    PR Interval:    QRS Duration:    QT Interval:    QTC Calculation:   R Axis:      Text Interpretation:           Imaging Studies ordered: I ordered imaging studies including chest x-ray I independently visualized and interpreted imaging. I agree with the radiologist interpretation   Medicines ordered and prescription drug management: Meds ordered this encounter  Medications   albuterol (VENTOLIN HFA) 108 (90 Base) MCG/ACT inhaler 2 puff   ipratropium-albuterol (DUONEB) 0.5-2.5 (3) MG/3ML nebulizer solution 3 mL   magnesium sulfate IVPB 2 g 50 mL   azithromycin (ZITHROMAX) 500 mg in sodium chloride 0.9 % 250 mL IVPB    -I have reviewed the patients home medicines and have made adjustments as needed  Critical interventions Respiratory support  Consultations Obtained: I requested consultation with the hospitalist,  and discussed lab and imaging findings as well as pertinent plan - they recommend: Admission   Cardiac Monitoring: The patient was maintained on a cardiac monitor.  I personally viewed and interpreted the cardiac  monitored which showed an underlying rhythm of: Atrial fibrillation  Social Determinants of Health:  Factors impacting patients care include: Lack  of access to home medications   Reevaluation: After the interventions noted above, I reevaluated the patient and found that they have :improved  Co morbidities that complicate the patient evaluation  Past Medical History:  Diagnosis Date   AF (atrial fibrillation) (HCC)    Alcohol abuse    Angina pectoris    CAD (coronary artery disease) 12/15/2004   COPD (chronic obstructive pulmonary disease) (HCC)    Coronary artery disease    Heavy cigarette smoker    HTN (hypertension)    Idiopathic cardiomyopathy (HCC) 07/16/2009   Non compliance with medical treatment    Seizures (HCC)       Dispostion: Admission     Final Clinical Impression(s) / ED Diagnoses Final diagnoses:  None     @PCDICTATION @    Anders Simmonds T, DO 12/05/22 2023

## 2022-12-05 NOTE — H&P (Incomplete)
History and Physical    Rucker Toma OZH:[id number] DOB: 25-Apr-1950 DOA: 12/05/2022  PCP: Rema Fendt, NP  Patient coming from: Home  Chief Complaint: Shortness of breath  HPI: Johnny Rodriguez is a 72 y.o. male with medical history significant of COPD, chronic hypoxemic respiratory failure on 3 L home oxygen, bronchomalacia, paroxysmal A-fib on Eliquis, chronic HFmrEF, CAD, hypertension, PE in February 2024, former alcohol abuse, seizures, marijuana abuse, cigarette smoking, GERD presented to the ED via EMS for evaluation of shortness of breath and cough.  He ran out of his home inhalers a week ago.  Oxygen saturation 89% on room air.  He was given albuterol, Atrovent, and Solu-Medrol 125 mg prior to arrival.  Upon arrival to the ED, patient was wheezing and only able to speak in few word sentences.  He was placed on BiPAP and was given IV mag 2 g.  Patient went into A-fib with RVR in the ED with rate in the 150s and was given a dose of IV Cardizem 20 mg with improvement of rate.  Patient was eventually weaned off of BiPAP and placed on 4 L [place].  Patient was also given azithromycin.  Afebrile.  Labs showing no leukocytosis, BNP pending, COVID/influenza/RSV PCR pending, VBG showing pH 7.31 and pCO2 74.5.  Chest x-ray showing emphysema and no acute cardiopulmonary process.  Patient states he uses 3 L home oxygen 24/7 for his COPD.  He ran out of his home inhalers a week ago and since then having increasing shortness of breath and productive cough.  Tonight he ran out of his home oxygen which made his breathing worse and EMS was called.  Denies fevers or chills.  Reports slight discomfort in his chest only when he coughs but not otherwise.  He reports compliance with his home Eliquis.  Review of Systems:  Review of Systems  All other systems reviewed and are negative.   Past Medical History:  Diagnosis Date  . AF (atrial fibrillation) (HCC)   . Alcohol abuse   . Angina pectoris   .  CAD (coronary artery disease) 12/15/2004  . COPD (chronic obstructive pulmonary disease) (HCC)   . Coronary artery disease   . Heavy cigarette smoker   . HTN (hypertension)   . Idiopathic cardiomyopathy (HCC) 07/16/2009  . Non compliance with medical treatment   . Seizures (HCC)     Past Surgical History:  Procedure Laterality Date  . CARDIAC CATHETERIZATION  2006  . CARDIAC CATHETERIZATION  12/2004   Left   . CARDIOVERSION  01/21/2011   Procedure: CARDIOVERSION;  Surgeon: Darden Palmer., MD;  Location: [facility] OR;  Service: Cardiovascular;  Laterality: N/A;  . DOPPLER ECHOCARDIOGRAPHY  02/2011  . ELECTROPHYSIOLOGY STUDY N/A 12/16/2011   Procedure: ELECTROPHYSIOLOGY STUDY;  Surgeon: Marinus Maw, MD;  Location[facility] C[facility] LAB;  Service: Cardiovascular;  Laterality: N/A;  . EP IMPLANTABLE DEVICE N/A 08/24/2015   Procedure: Loop Recorder Removal;  Surgeon: Marinus Maw, MD;  Location: [facility] INVASIVE CV LAB;  Service: Cardiovascular;  Laterality: N/A;  . fx collar bone    . NO PAST SURGERIES       reports that he has been smoking cigarettes. He has a 58 pack-year smoking history. He has never used smokeless tobacco. He reports that he does not currently use alcohol. He reports current drug use. Drug: Marijuana.  No Known Allergies  Family History  Problem Relation Age of Onset  . Suicidality Sister   . CVA Mother   .  Stroke Mother   . Heart disease Father   . Coronary artery disease Father   . COPD Father     Prior to Admission medications   Medication Sig Start Date End Date Taking? Authorizing Provider  albuterol (PROVENTIL) (2.5 MG/3ML) 0.083% nebulizer solution Take 3 mLs (2.5 mg total) by nebulization every 4 (four) hours as needed for wheezing or shortness of breath. 10/07/21  Yes Martina Sinner, MD  aspirin EC 81 MG tablet Take 81 mg by mouth daily. Swallow whole.   Yes [provider]  ELIQUIS 5 MG TABS tablet Take 5 mg by mouth 2 (two) times daily. 11/11/22  Yes  [provider]  folic acid (FOLVITE) 1 MG tablet Take 1 tablet (1 mg total) by mouth daily. 03/27/22  Yes Sowell, Apolinar Junes, MD  gabapentin (NEURONTIN) 400 MG capsule Take 400 mg by mouth 3 (three) times daily.   Yes [provider]  meloxicam (MOBIC) 15 MG tablet Take 15 mg by mouth daily as needed for pain. 11/11/22  Yes [provider]  metoprolol succinate (TOPROL-XL) 50 MG 24 hr tablet Take 1 tablet (50 mg total) by mouth daily. Take with or immediately following a meal. 06/03/21  Yes Zonia Kief, Amy J, NP  OXYGEN Inhale 3 L into the lungs continuous.    Yes [provider]  pantoprazole (PROTONIX) 40 MG tablet Take 40 mg by mouth daily as needed (acid reflux). 11/11/22  Yes [provider]  polyethylene glycol (MIRALAX / GLYCOLAX) 17 g packet Take 17 g by mouth daily. Patient taking differently: Take 17 g by mouth daily as needed for mild constipation. 03/15/19  Yes Dellia Cloud, MD  senna (SENOKOT) 8.6 MG TABS tablet Take 1-2 tablets (8.6-17.2 mg total) by mouth daily. Patient taking differently: Take 1-2 tablets by mouth daily as needed for moderate constipation. 03/15/19  Yes Dellia Cloud, MD  traZODone (DESYREL) 50 MG tablet TAKE 1 TABLET (50MG ) BY MOUTH AT BEDTIME Patient taking differently: Take 50 mg by mouth at bedtime as needed for sleep. 06/10/21  Yes Zonia Kief, Amy J, NP  VENTOLIN HFA 108 (90 Base) MCG/ACT inhaler INHALE 1-2 PUFFS INTO THE LUNGS EVERY 6 HOURS AS NEEDED FOR WHEEZING OR SHORTNESS OF BREATH 10/07/21  Yes Martina Sinner, MD  SPIRIVA RESPIMAT 2.5 MCG/ACT AERS Inhale 2 puffs into the lungs daily. Patient not taking: Reported on 12/05/2022 08/07/22   [provider]    Physical Exam: Vitals:   12/05/22 1858 12/05/22 2010 12/05/22 2014 12/05/22 2100  BP:  (!) 146/107  104/73  Pulse:  (!) 174 (!) 105 (!) 114  Resp: (!) 27 (!) 24 (!) 23 (!) 22  Temp:      SpO2:  (!) 89% 94% 91%    Physical Exam Vitals reviewed.   Constitutional:      General: He is not in acute distress. HENT:     Head: Normocephalic and atraumatic.  Eyes:     Extraocular Movements: Extraocular movements intact.  Cardiovascular:     Rate and Rhythm: Normal rate and regular rhythm.     Pulses: Normal pulses.  Pulmonary:     Breath sounds: Wheezing present.     Comments: Slightly tachypneic Diffuse wheezing Abdominal:     General: Bowel sounds are normal.     Palpations: Abdomen is soft.     Tenderness: There is no abdominal tenderness. There is no guarding.  Musculoskeletal:     Cervical back: Normal range of motion.     Right  lower leg: No edema.     Left lower leg: No edema.  Skin:    General: Skin is warm and dry.  Neurological:     General: No focal deficit present.     Mental Status: He is alert and oriented to person, place, and time.     Labs on Admission: I have personally reviewed following labs and imaging studies  CBC: Recent Labs  Lab 12/05/22 1817 12/05/22 1826  WBC 8.7  --   HGB 15.3 17.0  HCT 48.1 50.0  MCV 93.2  --   PLT 204  --    Basic Metabolic Panel: Recent Labs  Lab 12/05/22 1817 12/05/22 1826  NA 136 133*  K 4.5 6.1*  CL 93*  --   CO2 30  --   GLUCOSE 115*  --   BUN 17  --   CREATININE 1.14  --   CALCIUM 9.3  --    GFR: CrCl cannot be calculated (Unknown ideal weight.). Liver Function Tests: No results for input(s): "AST", "ALT", "ALKPHOS", "BILITOT", "PROT", "ALBUMIN" in the last 168 hours. No results for input(s): "LIPASE", "AMYLASE" in the last 168 hours. No results for input(s): "AMMONIA" in the last 168 hours. Coagulation Profile: No results for input(s): "INR", "PROTIME" in the last 168 hours. Cardiac Enzymes: No results for input(s): "CKTOTAL", "CKMB", "CKMBINDEX", "TROPONINI" in the last 168 hours. BNP (last 3 results) No results for input(s): "PROBNP" in the last 8760 hours. HbA1C: No results for input(s): "HGBA1C" in the last 72 hours. CBG: No results for  input(s): "GLUCAP" in the last 168 hours. Lipid Profile: No results for input(s): "CHOL", "HDL", "LDLCALC", "TRIG", "CHOLHDL", "LDLDIRECT" in the last 72 hours. Thyroid Function Tests: No results for input(s): "TSH", "T4TOTAL", "FREET4", "T3FREE", "THYROIDAB" in the last 72 hours. Anemia Panel: No results for input(s): "VITAMINB12", "FOLATE", "FERRITIN", "TIBC", "IRON", "RETICCTPCT" in the last 72 hours. Urine analysis:    Component Value Date/Time   COLORURINE YELLOW 11/18/2022 1120   APPEARANCEUR CLEAR 11/18/2022 1120   LABSPEC 1.015 11/18/2022 1120   PHURINE 5.0 11/18/2022 1120   GLUCOSEU NEGATIVE 11/18/2022 1120   HGBUR SMALL (A) 11/18/2022 1120   BILIRUBINUR NEGATIVE 11/18/2022 1120   KETONESUR NEGATIVE 11/18/2022 1120   PROTEINUR 30 (A) 11/18/2022 1120   UROBILINOGEN 0.2 12/08/2012 1516   NITRITE NEGATIVE 11/18/2022 1120   LEUKOCYTESUR NEGATIVE 11/18/2022 1120    Radiological Exams on Admission: DG Chest Portable 1 View  Result Date: 12/05/2022 CLINICAL DATA:  Shortness of breath EXAM: PORTABLE CHEST 1 VIEW COMPARISON:  Radiograph 11/18/2022 FINDINGS: Hyperinflation and chronic bronchitic changes. No focal consolidation, pleural effusion, or pneumothorax. Stable cardiomediastinal silhouette. Aortic atherosclerotic calcification. Remote left rib fractures. IMPRESSION: No acute cardiopulmonary process.  Emphysema. Electronically Signed   By: Minerva Fester M.D.   On: 12/05/2022 20:08    EKG: Independently reviewed.  A-fib with RVR, T wave abnormality in lateral leads.  Rate increased compared to prior EKG from 11/19/2022.  Assessment and Plan  Acute COPD exacerbation Acute on chronic hypoxemic hypercapnic respiratory failure Patient presenting with shortness breath and wheezing.  He ran out of his home inhalers a week ago.  Also on 3 L [place] 24/7 and ran out of his home oxygen tonight.  Oxygen saturation in the 80s on room air with EMS and was placed on BiPAP on arrival to the ED  due to respiratory distress.  Initial VBG showing pH 7.31 and pCO2 74.5.  No fever or leukocytosis.  Chest x-ray showing emphysema  and no acute cardiopulmonary process.  Patient received albuterol, Atrovent, and Solu-Medrol 125 by EMS.  He was given IV mag 2 g and azithromycin in the ED. He was weaned off of BiPAP in the ED and placed on 4 L [place].  Work of breathing has improved but not back to normal and he continues to have diffuse wheezing.  Patient is refusing BiPAP at this time. Continue treatment with Solu-Medrol 60 mg every 12 hours, scheduled levalbuterol and ipratropium every 8 hours, Pulmicort neb twice daily, azithromycin, Mucinex-DM as needed, and flutter valve.  Continuous pulse ox, continue supplemental oxygen.  COVID/influenza/RSV PCR pending.  Repeat VBG ordered.  He does have history of PE in February 2024 but reports compliance with Eliquis.  Will check stat D-dimer level.  Paroxysmal A-fib with RVR Likely related to beta agonist use.  Went into A-fib with RVR in the ED with rate in the 150s, now improved after he was given IV Cardizem 20 mg.  Rate currently <110. Continue his home metoprolol and Eliquis.  Chronic HFmrEF Echo done in May 2021 showing EF 45 to 50%, trivial mitral valve regurgitation.  Chest x-ray not suggestive of volume overload.  BNP pending.  CAD Patient is endorsing chest discomfort only with cough but not otherwise.  EKG without acute ischemic changes.  Continue aspirin and metoprolol.  Check troponin.  Hypertension Continue metoprolol.  History of PE in February 2024  Cigarette smoking  GERD  Chronic pain Continue gabapentin.    DVT prophylaxis: Eliquis Code Status: DNR (discussed with the patient) Family Communication: ***  Consults called: ***  Level of care: {Blank single:19197::"Med-Surg","Telemetry bed","Progressive Care Unit","Step Down Unit"} Admission status: *** Time Spent: 75+ minutes***  John Giovanni MD [facility]  If  7PM-7AM, please contact night-coverage [email or web address]  12/05/2022, 10:37 PM

## 2022-12-05 NOTE — ED Triage Notes (Signed)
Pt is coming in by medic, from home, for a COPD exacerbation. Pt has been [facility] all day, medic called out and tried troubleshooting his O2 machine, he did not have any medications to manage the exacerbation, he did not immediately respond to medics treatments, he has had 10mg  albuterol. Pt was 89% spO2 with medic, and has been on a continuous neb with medic. \ Total of 20mg  albuterol, 1mg  Atrovent and 125mg  solumedrol with medic  20g left fore arm  Medic vitals   160/100 72-160hr 24rr 98% 8l NEB 15GCS

## 2022-12-05 NOTE — ED Notes (Signed)
Patient off bipap per doctors verbal orders to see how he does without it. Respiratory notified.

## 2022-12-05 NOTE — H&P (Signed)
History and Physical    Witten Stenerson NWG:[id number] DOB: 1951/01/17 DOA: 12/05/2022  PCP: Rema Fendt, NP  Patient coming from: Home  Chief Complaint: Shortness of breath  HPI: Johnny Rodriguez is a 72 y.o. male with medical history significant of COPD, chronic hypoxemic respiratory failure on 3 L home oxygen, bronchomalacia, paroxysmal A-fib on Eliquis, chronic HFmrEF, CAD, hypertension, PE in February 2024, former alcohol abuse, seizures, marijuana abuse, cigarette smoking, GERD presented to the ED via EMS for evaluation of shortness of breath and cough.  He ran out of his home inhalers a week ago.  Oxygen saturation 89% on room air.  He was given albuterol, Atrovent, and Solu-Medrol 125 mg prior to arrival.  Upon arrival to the ED, patient was wheezing and only able to speak in few word sentences.  He was placed on BiPAP and was given IV mag 2 g.  Patient went into A-fib with RVR in the ED with rate in the 150s and was given a dose of IV Cardizem 20 mg with improvement of rate.  Patient was eventually weaned off of BiPAP and placed on 4 L [place].  Patient was also given azithromycin.  Afebrile.  Labs showing no leukocytosis, BNP pending, COVID/influenza/RSV PCR pending, VBG showing pH 7.31 and pCO2 74.5.  Chest x-ray showing emphysema and no acute cardiopulmonary process.  Patient states he uses 3 L home oxygen 24/7 for his COPD.  He ran out of his home inhalers a week ago and since then having increasing shortness of breath and productive cough.  Tonight he ran out of his home oxygen which made his breathing worse and EMS was called.  Denies fevers or chills.  Reports slight discomfort in his chest only when he coughs but not otherwise.  He reports compliance with his home Eliquis.  Review of Systems:  Review of Systems  All other systems reviewed and are negative.   Past Medical History:  Diagnosis Date   AF (atrial fibrillation) (HCC)    Alcohol abuse    Angina pectoris    CAD  (coronary artery disease) 12/15/2004   COPD (chronic obstructive pulmonary disease) (HCC)    Coronary artery disease    Heavy cigarette smoker    HTN (hypertension)    Idiopathic cardiomyopathy (HCC) 07/16/2009   Non compliance with medical treatment    Seizures [facility])     Past Surgical History:  Procedure Laterality Date   CARDIAC CATHETERIZATION  2006   CARDIAC CATHETERIZATION  12/2004   Left    CARDIOVERSION  01/21/2011   Procedure: CARDIOVERSION;  Surgeon: Darden Palmer., MD;  Location: [facility] OR;  Service: Cardiovascular;  Laterality: N/A;   DOPPLER ECHOCARDIOGRAPHY  02/2011   ELECTROPHYSIOLOGY STUDY N/A 12/16/2011   Procedure: ELECTROPHYSIOLOGY STUDY;  Surgeon: Marinus Maw, MD;  Location: [facility] CATH LAB;  Service: Cardiovascular;  Laterality: N/A;   EP IMPLANTABLE DEVICE N/A 08/24/2015   Procedure: Loop Recorder Removal;  Surgeon: Marinus Maw, MD;  Location: [facility] INVASIVE CV LAB;  Service: Cardiovascular;  Laterality: N/A;   fx collar bone     NO PAST SURGERIES       reports that he has been smoking cigarettes. He has a 58 pack-year smoking history. He has never used smokeless tobacco. He reports that he does not currently use alcohol. He reports current drug use. Drug: Marijuana.  No Known Allergies  Family History  Problem Relation Age of Onset   Suicidality Sister    CVA Mother  Stroke Mother    Heart disease Father    Coronary artery disease Father    COPD Father     Prior to Admission medications   Medication Sig Start Date End Date Taking? Authorizing Provider  albuterol (PROVENTIL) (2.5 MG/3ML) 0.083% nebulizer solution Take 3 mLs (2.5 mg total) by nebulization every 4 (four) hours as needed for wheezing or shortness of breath. 10/07/21  Yes Martina Sinner, MD  aspirin EC 81 MG tablet Take 81 mg by mouth daily. Swallow whole.   Yes [provider]  ELIQUIS 5 MG TABS tablet Take 5 mg by mouth 2 (two) times daily. 11/11/22  Yes [provider]   folic acid (FOLVITE) 1 MG tablet Take 1 tablet (1 mg total) by mouth daily. 03/27/22  Yes Sowell, Apolinar Junes, MD  gabapentin (NEURONTIN) 400 MG capsule Take 400 mg by mouth 3 (three) times daily.   Yes [provider]  meloxicam (MOBIC) 15 MG tablet Take 15 mg by mouth daily as needed for pain. 11/11/22  Yes [provider]  metoprolol succinate (TOPROL-XL) 50 MG 24 hr tablet Take 1 tablet (50 mg total) by mouth daily. Take with or immediately following a meal. 06/03/21  Yes Zonia Kief, Amy J, NP  OXYGEN Inhale 3 L into the lungs continuous.    Yes [provider]  pantoprazole (PROTONIX) 40 MG tablet Take 40 mg by mouth daily as needed (acid reflux). 11/11/22  Yes [provider]  polyethylene glycol (MIRALAX / GLYCOLAX) 17 g packet Take 17 g by mouth daily. Patient taking differently: Take 17 g by mouth daily as needed for mild constipation. 03/15/19  Yes Dellia Cloud, MD  senna (SENOKOT) 8.6 MG TABS tablet Take 1-2 tablets (8.6-17.2 mg total) by mouth daily. Patient taking differently: Take 1-2 tablets by mouth daily as needed for moderate constipation. 03/15/19  Yes Dellia Cloud, MD  traZODone (DESYREL) 50 MG tablet TAKE 1 TABLET (50MG ) BY MOUTH AT BEDTIME Patient taking differently: Take 50 mg by mouth at bedtime as needed for sleep. 06/10/21  Yes Zonia Kief, Amy J, NP  VENTOLIN HFA 108 (90 Base) MCG/ACT inhaler INHALE 1-2 PUFFS INTO THE LUNGS EVERY 6 HOURS AS NEEDED FOR WHEEZING OR SHORTNESS OF BREATH 10/07/21  Yes Martina Sinner, MD  SPIRIVA RESPIMAT 2.5 MCG/ACT AERS Inhale 2 puffs into the lungs daily. Patient not taking: Reported on 12/05/2022 08/07/22   [provider]    Physical Exam: Vitals:   12/05/22 1858 12/05/22 2010 12/05/22 2014 12/05/22 2100  BP:  (!) 146/107  104/73  Pulse:  (!) 174 (!) 105 (!) 114  Resp: (!) 27 (!) 24 (!) 23 (!) 22  Temp:      SpO2:  (!) 89% 94% 91%    Physical Exam Vitals reviewed.  Constitutional:      General:  He is not in acute distress. HENT:     Head: Normocephalic and atraumatic.  Eyes:     Extraocular Movements: Extraocular movements intact.  Cardiovascular:     Rate and Rhythm: Normal rate and regular rhythm.     Pulses: Normal pulses.  Pulmonary:     Breath sounds: Wheezing present.     Comments: Slightly tachypneic Diffuse wheezing Abdominal:     General: Bowel sounds are normal.     Palpations: Abdomen is soft.     Tenderness: There is no abdominal tenderness. There is no guarding.  Musculoskeletal:     Cervical back: Normal range of motion.     Right  lower leg: No edema.     Left lower leg: No edema.  Skin:    General: Skin is warm and dry.  Neurological:     General: No focal deficit present.     Mental Status: He is alert and oriented to person, place, and time.     Labs on Admission: I have personally reviewed following labs and imaging studies  CBC: Recent Labs  Lab 12/05/22 1817 12/05/22 1826  WBC 8.7  --   HGB 15.3 17.0  HCT 48.1 50.0  MCV 93.2  --   PLT 204  --    Basic Metabolic Panel: Recent Labs  Lab 12/05/22 1817 12/05/22 1826  NA 136 133*  K 4.5 6.1*  CL 93*  --   CO2 30  --   GLUCOSE 115*  --   BUN 17  --   CREATININE 1.14  --   CALCIUM 9.3  --    GFR: CrCl cannot be calculated (Unknown ideal weight.). Liver Function Tests: No results for input(s): "AST", "ALT", "ALKPHOS", "BILITOT", "PROT", "ALBUMIN" in the last 168 hours. No results for input(s): "LIPASE", "AMYLASE" in the last 168 hours. No results for input(s): "AMMONIA" in the last 168 hours. Coagulation Profile: No results for input(s): "INR", "PROTIME" in the last 168 hours. Cardiac Enzymes: No results for input(s): "CKTOTAL", "CKMB", "CKMBINDEX", "TROPONINI" in the last 168 hours. BNP (last 3 results) No results for input(s): "PROBNP" in the last 8760 hours. HbA1C: No results for input(s): "HGBA1C" in the last 72 hours. CBG: No results for input(s): "GLUCAP" in the last  168 hours. Lipid Profile: No results for input(s): "CHOL", "HDL", "LDLCALC", "TRIG", "CHOLHDL", "LDLDIRECT" in the last 72 hours. Thyroid Function Tests: No results for input(s): "TSH", "T4TOTAL", "FREET4", "T3FREE", "THYROIDAB" in the last 72 hours. Anemia Panel: No results for input(s): "VITAMINB12", "FOLATE", "FERRITIN", "TIBC", "IRON", "RETICCTPCT" in the last 72 hours. Urine analysis:    Component Value Date/Time   COLORURINE YELLOW 11/18/2022 1120   APPEARANCEUR CLEAR 11/18/2022 1120   LABSPEC 1.015 11/18/2022 1120   PHURINE 5.0 11/18/2022 1120   GLUCOSEU NEGATIVE 11/18/2022 1120   HGBUR SMALL (A) 11/18/2022 1120   BILIRUBINUR NEGATIVE 11/18/2022 1120   KETONESUR NEGATIVE 11/18/2022 1120   PROTEINUR 30 (A) 11/18/2022 1120   UROBILINOGEN 0.2 12/08/2012 1516   NITRITE NEGATIVE 11/18/2022 1120   LEUKOCYTESUR NEGATIVE 11/18/2022 1120    Radiological Exams on Admission: DG Chest Portable 1 View  Result Date: 12/05/2022 CLINICAL DATA:  Shortness of breath EXAM: PORTABLE CHEST 1 VIEW COMPARISON:  Radiograph 11/18/2022 FINDINGS: Hyperinflation and chronic bronchitic changes. No focal consolidation, pleural effusion, or pneumothorax. Stable cardiomediastinal silhouette. Aortic atherosclerotic calcification. Remote left rib fractures. IMPRESSION: No acute cardiopulmonary process.  Emphysema. Electronically Signed   By: Minerva Fester M.D.   On: 12/05/2022 20:08    EKG: Independently reviewed.  A-fib with RVR, T wave abnormality in lateral leads.  Rate increased compared to prior EKG from 11/19/2022.  Assessment and Plan  Acute COPD exacerbation Acute on chronic hypoxemic hypercapnic respiratory failure Patient presenting with shortness breath and wheezing.  He ran out of his home inhalers a week ago.  Oxygen saturation 89% on room air with EMS.  Placed on BiPAP on arrival to the ED due to respiratory distress.  Initial VBG showing pH 7.31 and pCO2 74.5.  No fever or leukocytosis.   Chest x-ray showing emphysema and no acute cardiopulmonary process.  Patient received albuterol, Atrovent, and Solu-Medrol 125 by EMS.  He was  given IV mag 2 g and azithromycin in the ED.  Now weaned off of BiPAP and currently stable on 4 L [place] which is close to his baseline (uses 3 L at home).  Continues to have wheezing.  Continue treatment with Solu-Medrol 60 mg every 12 hours, scheduled levalbuterol and ipratropium every 8 hours, Pulmicort neb twice daily, azithromycin, Mucinex-DM as needed, and flutter valve.  Continuous pulse ox, continue supplemental oxygen.  COVID/influenza/RSV PCR pending.  Repeat VBG ordered.  Paroxysmal A-fib with RVR Likely related to beta agonist use.  Went into A-fib with RVR in the ED with rate in the 150s, now improved after he was given IV Cardizem 20 mg.  Continue his home metoprolol and Eliquis.  Chronic HFmrEF Echo done in May 2021 showing EF 45 to 50%, trivial mitral valve regurgitation.  Chest x-ray not suggestive of volume overload.  BNP pending.  CAD Patient is endorsing chest discomfort only with cough but not otherwise.  EKG without acute ischemic changes.  Continue aspirin and metoprolol.  Check troponin.  Hypertension Continue metoprolol.  History of PE in February 2024  Cigarette smoking  GERD  Chronic pain Continue gabapentin.    DVT prophylaxis: Eliquis Code Status: DNR (discussed with the patient) Family Communication: ***  Consults called: ***  Level of care: {Blank single:19197::"Med-Surg","Telemetry bed","Progressive Care Unit","Step Down Unit"} Admission status: *** Time Spent: 75+ minutes***  John Giovanni MD [facility]  If 7PM-7AM, please contact night-coverage [email or web address]  12/05/2022, 10:37 PM

## 2022-12-06 DIAGNOSIS — J9621 Acute and chronic respiratory failure with hypoxia: Secondary | ICD-10-CM | POA: Diagnosis not present

## 2022-12-06 DIAGNOSIS — I5022 Chronic systolic (congestive) heart failure: Secondary | ICD-10-CM | POA: Diagnosis not present

## 2022-12-06 DIAGNOSIS — I4891 Unspecified atrial fibrillation: Secondary | ICD-10-CM | POA: Diagnosis not present

## 2022-12-06 DIAGNOSIS — J441 Chronic obstructive pulmonary disease with (acute) exacerbation: Secondary | ICD-10-CM | POA: Diagnosis not present

## 2022-12-06 LAB — BLOOD GAS, VENOUS
Acid-Base Excess: 2.4 mmol/L — ABNORMAL HIGH (ref 0.0–2.0)
Bicarbonate: 30.2 mmol/L — ABNORMAL HIGH (ref 20.0–28.0)
O2 Saturation: 34.8 %
Patient temperature: 37
pCO2, Ven: 60 mm[Hg] (ref 44–60)
pH, Ven: 7.31 (ref 7.25–7.43)
pO2, Ven: <31 mm[Hg] — CL (ref 32–45)

## 2022-12-06 LAB — RESP PANEL BY RT-PCR (RSV, FLU A&B, COVID)  RVPGX2
Influenza A by PCR: NEGATIVE
Influenza B by PCR: NEGATIVE
Resp Syncytial Virus by PCR: NEGATIVE
SARS Coronavirus 2 by RT PCR: NEGATIVE

## 2022-12-06 LAB — TROPONIN I (HIGH SENSITIVITY): Troponin I (High Sensitivity): 13 ng/L (ref <18)

## 2022-12-06 LAB — D-DIMER, QUANTITATIVE: D-Dimer, Quant: <0.27 ug{FEU}/mL (ref 0.00–0.50)

## 2022-12-06 MED ORDER — METHYLPREDNISOLONE SODIUM SUCC 125 MG IJ SOLR
60.0000 mg | Freq: Two times a day (BID) | INTRAMUSCULAR | Status: DC
  Administered 2022-12-06 – 2022-12-07 (×2): 60 mg via INTRAVENOUS
  Filled 2022-12-06 (×2): qty 2

## 2022-12-06 MED ORDER — FOLIC ACID 1 MG PO TABS
1.0000 mg | ORAL_TABLET | Freq: Every day | ORAL | Status: DC
  Administered 2022-12-06 – 2022-12-08 (×3): 1 mg via ORAL
  Filled 2022-12-06 (×3): qty 1

## 2022-12-06 MED ORDER — POLYETHYLENE GLYCOL 3350 17 G PO PACK
17.0000 g | PACK | Freq: Every day | ORAL | Status: DC | PRN
  Administered 2022-12-07: 17 g via ORAL
  Filled 2022-12-06: qty 1

## 2022-12-06 MED ORDER — METOPROLOL TARTRATE 5 MG/5ML IV SOLN: 2.5000 mg | Freq: Four times a day (QID) | INTRAVENOUS | Status: DC | PRN

## 2022-12-06 MED ORDER — PANTOPRAZOLE SODIUM 40 MG PO TBEC: 40.0000 mg | DELAYED_RELEASE_TABLET | Freq: Every day | ORAL | Status: DC | PRN

## 2022-12-06 NOTE — Progress Notes (Signed)
   12/06/22 2000  BiPAP/CPAP/SIPAP  Reason BIPAP/CPAP not in use Non-compliant   Pt. Refused says he doesn't likes how it feels

## 2022-12-06 NOTE — ED Notes (Signed)
Pts O2 dropped to 82% on 4L when standing to change his brief. Peri-care provided & after sitting his O2 went up to 92% & his O2 via n/c was put to 5L while he was deep breathing through his exertional SOB. Pt denies any other discomfort, was repositioned in bed & asking for food. Admitting was reached out to for a diet order by this RN.

## 2022-12-06 NOTE — Plan of Care (Signed)
  Problem: Education: Goal: Knowledge of disease or condition will improve Outcome: Progressing Goal: Knowledge of the prescribed therapeutic regimen will improve Outcome: Progressing Goal: Individualized Educational Video(s) Outcome: Progressing   Problem: Activity: Goal: Ability to tolerate increased activity will improve Outcome: Progressing Goal: Will verbalize the importance of balancing activity with adequate rest periods Outcome: Progressing   Problem: Respiratory: Goal: Ability to maintain a clear airway will improve Outcome: Progressing Goal: Levels of oxygenation will improve Outcome: Progressing Goal: Ability to maintain adequate ventilation will improve Outcome: Progressing   Problem: Education: Goal: Knowledge of General Education information will improve Description: Including pain rating scale, medication(s)/side effects and non-pharmacologic comfort measures Outcome: Progressing   Problem: Health Behavior/Discharge Planning: Goal: Ability to manage health-related needs will improve Outcome: Progressing   Problem: Clinical Measurements: Goal: Ability to maintain clinical measurements within normal limits will improve Outcome: Progressing Goal: Will remain free from infection Outcome: Progressing Goal: Diagnostic test results will improve Outcome: Progressing Goal: Respiratory complications will improve Outcome: Progressing Goal: Cardiovascular complication will be avoided Outcome: Progressing   Problem: Activity: Goal: Risk for activity intolerance will decrease Outcome: Progressing   Problem: Nutrition: Goal: Adequate nutrition will be maintained Outcome: Progressing   Problem: Coping: Goal: Level of anxiety will decrease Outcome: Progressing   Problem: Elimination: Goal: Will not experience complications related to bowel motility Outcome: Progressing Goal: Will not experience complications related to urinary retention Outcome: Progressing    Problem: Pain Management: Goal: General experience of comfort will improve Outcome: Progressing   Problem: Safety: Goal: Ability to remain free from injury will improve Outcome: Progressing   Problem: Skin Integrity: Goal: Risk for impaired skin integrity will decrease Outcome: Progressing

## 2022-12-06 NOTE — Progress Notes (Signed)
[facility] PROGRESS NOTE   Johnny Rodriguez ZOX:[id number] DOB: 08-23-1950 DOA: 12/05/2022  PCP: Rema Fendt, NP  Brief History: 72 y.o. male with medical history significant of COPD, chronic hypoxemic respiratory failure on 3 L home oxygen, bronchomalacia, paroxysmal A-fib on Eliquis, chronic HFrEF, CAD, hypertension, PE in February 2024, former alcohol abuse, seizures, marijuana abuse, cigarette smoking, GERD presented to the ED via EMS for evaluation of shortness of breath and cough.  He ran out of his home inhalers a week ago.  He recently moved to an apartment and during the move apparently his oxygen machine got damaged.  He was hospitalized for further management of COPD exacerbation.    Consultants: None  Procedures: None    Subjective/Interval History: Feels a little better this morning but still short of breath.  Denies any chest pain.  Very worried about his home oxygen.  He was reassured and told that we will have case manager assist him with this.    Assessment/Plan:  COPD with acute exacerbation/acute on chronic hypoxemic and hypercapnic respiratory failure Apparently he ran out of his inhalers a week ago.  And then his oxygen machine got damaged during recent move.  He does have 3 tanks at home.  He is requesting assistance with this.  Will consult TOC. His pCO2 was noted to be elevated.  Improved after he was placed on BiPAP.  Removed BiPAP this morning.  Seems to be stable on oxygen.  Still tachypneic. Continue with Solu-Medrol.  He is noted to be on budesonide nebulized along with Xopenex and Atrovent nebulizers. Continue with azithromycin as well.  Paroxysmal atrial fibrillation with RVR Elevated heart rate likely due to respiratory distress as well as use of bronchodilators.  Had to be given a bolus dose of Cardizem with improvement in heart rate.  Continue with metoprolol and apixaban.  Monitor heart rates on telemetry.  Chronic systolic CHF Echo  done in 2021 showed EF of 45 to 50%.  No evidence for volume overload.  BNP noted to be normal.  Coronary artery disease EKG without any acute ischemic changes.  Continue with aspirin beta-blocker.  Essential hypertension Stable.  History of PE Continue Eliquis.  Tobacco abuse Smoking cessation was encouraged.  Chronic pain Continue gabapentin.   DVT Prophylaxis: On apixaban Code Status: DNR but okay with intubation Family Communication: Discussed with patient Disposition Plan: Hopefully return home when improved  Status is: Inpatient Remains inpatient appropriate because: COPD with acute exacerbation      Medications: Scheduled:  apixaban  5 mg Oral BID   aspirin EC  81 mg Oral Daily   azithromycin  250 mg Oral Daily   budesonide (PULMICORT) nebulizer solution  0.25 mg Nebulization BID   gabapentin  400 mg Oral TID   ipratropium  0.5 mg Nebulization Q8H   levalbuterol  0.63 mg Nebulization Q8H   methylPREDNISolone (SOLU-MEDROL) injection  120 mg Intravenous Q24H   metoprolol succinate  50 mg Oral Daily   Continuous: WJX:[id number] **OR** acetaminophen, dextromethorphan-guaiFENesin  Antibiotics: Anti-infectives (From admission, onward)    Start     Dose/Rate Route Frequency Ordered Stop   12/06/22 1000  azithromycin (ZITHROMAX) tablet 250 mg        250 mg Oral Daily 12/05/22 2346 12/10/22 0959   12/05/22 1830  azithromycin (ZITHROMAX) 500 mg in sodium chloride 0.9 % 250 mL IVPB        500 mg 250 mL/hr over 60 Minutes Intravenous  Once 12/05/22 1824 12/05/22 2312  Objective:  Vital Signs  Vitals:   12/06/22 0819 12/06/22 0822 12/06/22 0823 12/06/22 0930  BP: (!) 153/79   121/88  Pulse: (!) 118 79 (!) 120 (!) 111  Resp: (!) 26 (!) 23 (!) 21 (!) 22  Temp:      TempSrc:      SpO2: (!) 82% 91% 90% 93%    Intake/Output Summary (Last 24 hours) at 12/06/2022 0945 Last data filed at 12/05/2022 2312 Gross per 24 hour  Intake 300 ml  Output --   Net 300 ml   There were no vitals filed for this visit.  General appearance: Awake alert.  In no distress Resp: Tachycardia neck.  No use of accessory muscles.  Coarse breath sounds with wheezing bilaterally.  Few crackles at the bases. Cardio: S1-S2 is irregularly irregular, mildly tachycardic.   GI: Abdomen is soft.  Nontender nondistended.  Bowel sounds are present normal.  No masses organomegaly Extremities: No edema.  Full range of motion of lower extremities. Neurologic: Alert and oriented x3.  No focal neurological deficits.    Lab Results:  Data Reviewed: I have personally reviewed following labs and reports of the imaging studies  CBC: Recent Labs  Lab 12/05/22 1817 12/05/22 1826  WBC 8.7  --   HGB 15.3 17.0  HCT 48.1 50.0  MCV 93.2  --   PLT 204  --     Basic Metabolic Panel: Recent Labs  Lab 12/05/22 1817 12/05/22 1826  NA 136 133*  K 4.5 6.1*  CL 93*  --   CO2 30  --   GLUCOSE 115*  --   BUN 17  --   CREATININE 1.14  --   CALCIUM 9.3  --     GFR: CrCl cannot be calculated (Unknown ideal weight.).   Recent Results (from the past 240 hour(s))  Resp panel by RT-PCR (RSV, Flu A&B, Covid) Anterior Nasal Swab     Status: None   Collection Time: 12/06/22 12:29 AM   Specimen: Anterior Nasal Swab  Result Value Ref Range Status   SARS Coronavirus 2 by RT PCR NEGATIVE NEGATIVE Final   Influenza A by PCR NEGATIVE NEGATIVE Final   Influenza B by PCR NEGATIVE NEGATIVE Final    Comment: (NOTE) The Xpert Xpress SARS-CoV-2/FLU/RSV plus assay is intended as an aid in the diagnosis of influenza from Nasopharyngeal swab specimens and should not be used as a sole basis for treatment. Nasal washings and aspirates are unacceptable for Xpert Xpress SARS-CoV-2/FLU/RSV testing.  Fact Sheet for Patients: BloggerCourse.com  Fact Sheet for Healthcare Providers: SeriousBroker.it  This test is not yet approved or  cleared by the [place] FDA and has been authorized for detection and/or diagnosis of SARS-CoV-2 by FDA under an Emergency Use Authorization (EUA). This EUA will remain in effect (meaning this test can be used) for the duration of the COVID-19 declaration under Section 564(b)(1) of the Act, 21 U.S.C. section 360bbb-3(b)(1), unless the authorization is terminated or revoked.     Resp Syncytial Virus by PCR NEGATIVE NEGATIVE Final    Comment: (NOTE) Fact Sheet for Patients: BloggerCourse.com  Fact Sheet for Healthcare Providers: SeriousBroker.it  This test is not yet approved or cleared by the [place] FDA and has been authorized for detection and/or diagnosis of SARS-CoV-2 by FDA under an Emergency Use Authorization (EUA). This EUA will remain in effect (meaning this test can be used) for the duration of the COVID-19 declaration under Section 564(b)(1) of the Act, 21 U.S.C. section  360bbb-3(b)(1), unless the authorization is terminated or revoked.  Performed at [facility], [address] [place], [place] [address]       Radiology Studies: DG Chest Portable 1 View  Result Date: 12/05/2022 CLINICAL DATA:  Shortness of breath EXAM: PORTABLE CHEST 1 VIEW COMPARISON:  Radiograph 11/18/2022 FINDINGS: Hyperinflation and chronic bronchitic changes. No focal consolidation, pleural effusion, or pneumothorax. Stable cardiomediastinal silhouette. Aortic atherosclerotic calcification. Remote left rib fractures. IMPRESSION: No acute cardiopulmonary process.  Emphysema. Electronically Signed   By: Minerva Fester M.D.   On: 12/05/2022 20:08       LOS: 1 day   Osvaldo Shipper  [facility] Pager on [email or web address]  12/06/2022, 9:45 AM

## 2022-12-06 NOTE — ED Notes (Signed)
ED TO INPATIENT HANDOFF REPORT  ED Nurse Name and Phone #: Beatris Ship RN [phone]  S Name/Age/Gender Johnny Rodriguez 72 y.o. male Room/Bed: 005C/005C  Code Status   Code Status: Do not attempt resuscitation (DNR) PRE-ARREST INTERVENTIONS DESIRED  Home/SNF/Other Home Patient oriented to: self, place, time, and situation Is this baseline? Yes   Triage Complete: Triage complete  Chief Complaint COPD with acute exacerbation [facility]) [J44.1]  Triage Note Pt is coming in by medic, from home, for a COPD exacerbation. Pt has been [facility] all day, medic called out and tried troubleshooting his O2 machine, he did not have any medications to manage the exacerbation, he did not immediately respond to medics treatments, he has had 10mg  albuterol. Pt was 89% spO2 with medic, and has been on a continuous neb with medic. \ Total of 20mg  albuterol, 1mg  Atrovent and 125mg  solumedrol with medic  20g left fore arm  Medic vitals   160/100 72-160hr 24rr 98% 8l NEB 15GCS    Allergies No Known Allergies  Level of Care/Admitting Diagnosis ED Disposition     ED Disposition  Admit   Condition  --   Comment  Hospital [facility]: [facility] [[id number]]  Level of Care: Progressive [102]  Admit to Progressive based on following criteria: RESPIRATORY PROBLEMS hypoxemic/hypercapnic respiratory failure that is responsive to NIPPV (BiPAP) or High Flow Nasal Cannula (6-80 lpm). Frequent assessment/intervention, no > Q2 hrs < Q4 hrs, to maintain oxygenation and pulmonary hygiene.  May admit patient to [facility] or [facility] if equivalent level of care is available:: Yes  Covid Evaluation: Asymptomatic - no recent exposure (last 10 days) testing not required  Diagnosis: COPD with acute exacerbation [facility]) [[id number]]  Admitting Physician: John Giovanni [[id number]]  Attending Physician: John Giovanni [[id number]]  Certification:: I certify this patient will need inpatient services for at least  2 midnights  Expected Medical Readiness: 12/08/2022          B Medical/Surgery History Past Medical History:  Diagnosis Date   AF (atrial fibrillation) (HCC)    Alcohol abuse    Angina pectoris    CAD (coronary artery disease) 12/15/2004   COPD (chronic obstructive pulmonary disease) (HCC)    Coronary artery disease    Heavy cigarette smoker    HTN (hypertension)    Idiopathic cardiomyopathy (HCC) 07/16/2009   Non compliance with medical treatment    Seizures [facility])    Past Surgical History:  Procedure Laterality Date   CARDIAC CATHETERIZATION  2006   CARDIAC CATHETERIZATION  12/2004   Left    CARDIOVERSION  01/21/2011   Procedure: CARDIOVERSION;  Surgeon: Darden Palmer., MD;  Location[facility] OR;  Service: Cardiovascular;  Laterality: N/A;   DOPPLER ECHOCARDIOGRAPHY  02/2011   ELECTROPHYSIOLOGY STUDY N/A 12/16/2011   Procedure: ELECTROPHYSIOLOGY STUDY;  Surgeon: Marinus Maw, MD;  Location: [facility] CATH LAB;  Service: Cardiovascular;  Laterality: N/A;   EP IMPLANTABLE DEVICE N/A 08/24/2015   Procedure: Loop Recorder Removal;  Surgeon: Marinus Maw, MD;  Location: [facility] INVASIVE CV LAB;  Service: Cardiovascular;  Laterality: N/A;   fx collar bone     NO PAST SURGERIES       A IV Location/Drains/Wounds Patient Lines/Drains/Airways Status     Active Line/Drains/Airways     Name Placement date Placement time Site Days   Peripheral IV 12/05/22 20 G Left;Posterior Forearm 12/05/22  --  Forearm  1            Intake/Output Last  24 hours  Intake/Output Summary (Last 24 hours) at 12/06/2022 1107 Last data filed at 12/05/2022 2312 Gross per 24 hour  Intake 300 ml  Output --  Net 300 ml    Labs/Imaging Results for orders placed or performed during the hospital encounter of 12/05/22 (from the past 48 hour(s))  Basic metabolic panel     Status: Abnormal   Collection Time: 12/05/22  6:17 PM  Result Value Ref Range   Sodium 136 135 - 145 mmol/L   Potassium 4.5 3.5 - 5.1  mmol/L   Chloride 93 (L) 98 - 111 mmol/L   CO2 30 22 - 32 mmol/L   Glucose, Bld 115 (H) 70 - 99 mg/dL    Comment: Glucose reference range applies only to samples taken after fasting for at least 8 hours.   BUN 17 8 - 23 mg/dL   Creatinine, Ser 1.61 0.61 - 1.24 mg/dL   Calcium 9.3 8.9 - 09.6 mg/dL   GFR, Estimated >04 >54 mL/min    Comment: (NOTE) Calculated using the CKD-EPI Creatinine Equation (2021)    Anion gap 13 5 - 15    Comment: Performed at [facility] Lab, [address], [place], [place] [address]  CBC     Status: None   Collection Time: 12/05/22  6:17 PM  Result Value Ref Range   WBC 8.7 4.0 - 10.5 K/uL   RBC 5.16 4.22 - 5.81 MIL/uL   Hemoglobin 15.3 13.0 - 17.0 g/dL   HCT 91.4 78.2 - 95.6 %   MCV 93.2 80.0 - 100.0 fL   MCH 29.7 26.0 - 34.0 pg   MCHC 31.8 30.0 - 36.0 g/dL   RDW 21.3 08.6 - 57.8 %   Platelets 204 150 - 400 K/uL   nRBC 0.0 0.0 - 0.2 %    Comment: Performed at [facility] Lab, [address], [place], [place] [address]  Brain natriuretic peptide     Status: None   Collection Time: 12/05/22  6:17 PM  Result Value Ref Range   B Natriuretic Peptide 39.2 0.0 - 100.0 pg/mL    Comment: Performed at [facility] Lab, [address] [place], [place] [address]  I-Stat venous blood gas, [facility] ED, [facility], [facility])     Status: Abnormal   Collection Time: 12/05/22  6:26 PM  Result Value Ref Range   pH, Ven 7.311 7.25 - 7.43   pCO2, Ven 74.5 (HH) 44 - 60 mmHg   pO2, Ven 59 (H) 32 - 45 mmHg   Bicarbonate 37.6 (H) 20.0 - 28.0 mmol/L   TCO2 40 (H) 22 - 32 mmol/L   O2 Saturation 86 %   Acid-Base Excess 8.0 (H) 0.0 - 2.0 mmol/L   Sodium 133 (L) 135 - 145 mmol/L   Potassium 6.1 (H) 3.5 - 5.1 mmol/L   Calcium, Ion 1.06 (L) 1.15 - 1.40 mmol/L   HCT 50.0 39.0 - 52.0 %   Hemoglobin 17.0 13.0 - 17.0 g/dL   Sample type VENOUS    Comment NOTIFIED PHYSICIAN   Resp panel by RT-PCR (RSV, Flu A&B, Covid) Anterior Nasal Swab     Status: None   Collection Time: 12/06/22  12:29 AM   Specimen: Anterior Nasal Swab  Result Value Ref Range   SARS Coronavirus 2 by RT PCR NEGATIVE NEGATIVE   Influenza A by PCR NEGATIVE NEGATIVE   Influenza B by PCR NEGATIVE NEGATIVE    Comment: (NOTE) The Xpert Xpress SARS-CoV-2/FLU/RSV plus assay is intended as  an aid in the diagnosis of influenza from Nasopharyngeal swab specimens and should not be used as a sole basis for treatment. Nasal washings and aspirates are unacceptable for Xpert Xpress SARS-CoV-2/FLU/RSV testing.  Fact Sheet for Patients: BloggerCourse.com  Fact Sheet for Healthcare Providers: SeriousBroker.it  This test is not yet approved or cleared by the [place] FDA and has been authorized for detection and/or diagnosis of SARS-CoV-2 by FDA under an Emergency Use Authorization (EUA). This EUA will remain in effect (meaning this test can be used) for the duration of the COVID-19 declaration under Section 564(b)(1) of the Act, 21 U.S.C. section 360bbb-3(b)(1), unless the authorization is terminated or revoked.     Resp Syncytial Virus by PCR NEGATIVE NEGATIVE    Comment: (NOTE) Fact Sheet for Patients: BloggerCourse.com  Fact Sheet for Healthcare Providers: SeriousBroker.it  This test is not yet approved or cleared by the [place] FDA and has been authorized for detection and/or diagnosis of SARS-CoV-2 by FDA under an Emergency Use Authorization (EUA). This EUA will remain in effect (meaning this test can be used) for the duration of the COVID-19 declaration under Section 564(b)(1) of the Act, 21 U.S.C. section 360bbb-3(b)(1), unless the authorization is terminated or revoked.  Performed at [facility], [address], [place], [place] [address]   Blood gas, venous     Status: Abnormal   Collection Time: 12/06/22 12:29 AM  Result Value Ref Range   pH, Ven 7.31 7.25 - 7.43    pCO2, Ven 60 44 - 60 mmHg   pO2, Ven <31 (LL) 32 - 45 mmHg    Comment: CRITICAL RESULT CALLED TO, READ BACK BY AND VERIFIED WITH:  Veverly Fells RN [phone] M. ALAMANO    Bicarbonate 30.2 (H) 20.0 - 28.0 mmol/L   Acid-Base Excess 2.4 (H) 0.0 - 2.0 mmol/L   O2 Saturation 34.8 %   Patient temperature 37.0     Comment: Performed at [facility], [address] [place], [place] [address]  Troponin I (High Sensitivity)     Status: None   Collection Time: 12/06/22 12:29 AM  Result Value Ref Range   Troponin I (High Sensitivity) 13 <18 ng/L    Comment: (NOTE) Elevated high sensitivity troponin I (hsTnI) values and significant  changes across serial measurements may suggest ACS but many other  chronic and acute conditions are known to elevate hsTnI results.  Refer to the "Links" section for chest pain algorithms and additional  guidance. Performed at [facility], [address], [place], [place] [address]   D-dimer, quantitative     Status: None   Collection Time: 12/06/22 12:29 AM  Result Value Ref Range   D-Dimer, Quant <0.27 0.00 - 0.50 ug/mL-FEU    Comment: (NOTE) At the manufacturer cut-off value of 0.5 g/mL FEU, this assay has a negative predictive value of 95-100%.This assay is intended for use in conjunction with a clinical pretest probability (PTP) assessment model to exclude pulmonary embolism (PE) and deep venous thrombosis (DVT) in outpatients suspected of PE or DVT. Results should be correlated with clinical presentation. Performed at [facility] Lab, [address], [place], [place] [address]    DG Chest Portable 1 View  Result Date: 12/05/2022 CLINICAL DATA:  Shortness of breath EXAM: PORTABLE CHEST 1 VIEW COMPARISON:  Radiograph 11/18/2022 FINDINGS: Hyperinflation and chronic bronchitic changes. No focal consolidation, pleural effusion, or pneumothorax. Stable cardiomediastinal silhouette. Aortic atherosclerotic calcification. Remote left rib  fractures. IMPRESSION: No  acute cardiopulmonary process.  Emphysema. Electronically Signed   By: Minerva Fester M.D.   On: 12/05/2022 20:08    Pending Labs Unresulted Labs (From admission, onward)     Start     Ordered   12/07/22 0500  CBC  Tomorrow morning,   R        12/06/22 0951   12/07/22 0500  Basic metabolic panel  Tomorrow morning,   R        12/06/22 0951            Vitals/Pain Today's Vitals   12/06/22 0823 12/06/22 0930 12/06/22 1000 12/06/22 1030  BP:  121/88 115/85   Pulse: (!) 120 (!) 111 97   Resp: (!) 21 (!) 22 16   Temp:      TempSrc:      SpO2: 90% 93% 93%   PainSc:    0-No pain    Isolation Precautions No active isolations  Medications Medications  aspirin EC tablet 81 mg (81 mg Oral Given 12/06/22 0928)  metoprolol succinate (TOPROL-XL) 24 hr tablet 50 mg (50 mg Oral Given 12/06/22 0927)  apixaban (ELIQUIS) tablet 5 mg (5 mg Oral Given 12/06/22 0927)  gabapentin (NEURONTIN) capsule 400 mg (400 mg Oral Given 12/06/22 0928)  azithromycin (ZITHROMAX) tablet 250 mg (250 mg Oral Given 12/06/22 0927)  acetaminophen (TYLENOL) tablet 650 mg (has no administration in time range)    Or  acetaminophen (TYLENOL) suppository 650 mg (has no administration in time range)  levalbuterol (XOPENEX) nebulizer solution 0.63 mg (0.63 mg Nebulization Given 12/06/22 0657)  ipratropium (ATROVENT) nebulizer solution 0.5 mg (0.5 mg Nebulization Given 12/06/22 0657)  budesonide (PULMICORT) nebulizer solution 0.25 mg (0.25 mg Nebulization Given 12/06/22 0925)  dextromethorphan-guaiFENesin (MUCINEX DM) 30-600 MG per 12 hr tablet 1 tablet (has no administration in time range)  methylPREDNISolone sodium succinate (SOLU-MEDROL) 125 mg/2 mL injection 60 mg (has no administration in time range)  metoprolol tartrate (LOPRESSOR) injection 2.5 mg (has no administration in time range)  folic acid (FOLVITE) tablet 1 mg (1 mg Oral Given 12/06/22 1036)  pantoprazole (PROTONIX) EC tablet  40 mg (has no administration in time range)  polyethylene glycol (MIRALAX / GLYCOLAX) packet 17 g (has no administration in time range)  magnesium sulfate IVPB 2 g 50 mL (0 g Intravenous Stopped 12/05/22 2125)  azithromycin (ZITHROMAX) 500 mg in sodium chloride 0.9 % 250 mL IVPB (0 mg Intravenous Stopped 12/05/22 2312)  diltiazem (CARDIZEM) injection 20 mg (20 mg Intravenous Given 12/05/22 2010)    Mobility walks     Focused Assessments Pulmonary Assessment Handoff:  Lung sounds: Bilateral Breath Sounds: Diminished L Breath Sounds: Diminished R Breath Sounds: Diminished O2 Device: Nasal Cannula O2 Flow Rate (L/min): 5 L/min    R Recommendations: See Admitting Provider Note  Report given to: 1O10

## 2022-12-07 DIAGNOSIS — J441 Chronic obstructive pulmonary disease with (acute) exacerbation: Secondary | ICD-10-CM

## 2022-12-07 DIAGNOSIS — I4891 Unspecified atrial fibrillation: Secondary | ICD-10-CM

## 2022-12-07 DIAGNOSIS — I5022 Chronic systolic (congestive) heart failure: Secondary | ICD-10-CM

## 2022-12-07 DIAGNOSIS — J9621 Acute and chronic respiratory failure with hypoxia: Secondary | ICD-10-CM | POA: Diagnosis not present

## 2022-12-07 DIAGNOSIS — J9622 Acute and chronic respiratory failure with hypercapnia: Secondary | ICD-10-CM

## 2022-12-07 LAB — CBC
HCT: 43 % (ref 39.0–52.0)
Hemoglobin: 13.8 g/dL (ref 13.0–17.0)
MCH: 30.6 pg (ref 26.0–34.0)
MCHC: 32.1 g/dL (ref 30.0–36.0)
MCV: 95.3 fL (ref 80.0–100.0)
Platelets: 177 10*3/uL (ref 150–400)
RBC: 4.51 MIL/uL (ref 4.22–5.81)
RDW: 13.2 % (ref 11.5–15.5)
WBC: 21.1 10*3/uL — ABNORMAL HIGH (ref 4.0–10.5)
nRBC: 0 % (ref 0.0–0.2)

## 2022-12-07 LAB — BASIC METABOLIC PANEL
Anion gap: 11 (ref 5–15)
BUN: 20 mg/dL (ref 8–23)
CO2: 32 mmol/L (ref 22–32)
Calcium: 8.9 mg/dL (ref 8.9–10.3)
Chloride: 93 mmol/L — ABNORMAL LOW (ref 98–111)
Creatinine, Ser: 1.03 mg/dL (ref 0.61–1.24)
GFR, Estimated: >60 mL/min (ref >60)
Glucose, Bld: 156 mg/dL — ABNORMAL HIGH (ref 70–99)
Potassium: 4.3 mmol/L (ref 3.5–5.1)
Sodium: 136 mmol/L (ref 135–145)

## 2022-12-07 MED ORDER — SALINE SPRAY 0.65 % NA SOLN
1.0000 | NASAL | Status: DC | PRN
  Filled 2022-12-07: qty 44

## 2022-12-07 MED ORDER — IPRATROPIUM BROMIDE 0.02 % IN SOLN
0.5000 mg | Freq: Three times a day (TID) | RESPIRATORY_TRACT | Status: DC
  Administered 2022-12-07 – 2022-12-08 (×4): 0.5 mg via RESPIRATORY_TRACT
  Filled 2022-12-07 (×4): qty 2.5

## 2022-12-07 MED ORDER — LEVALBUTEROL HCL 0.63 MG/3ML IN NEBU
0.6300 mg | INHALATION_SOLUTION | Freq: Three times a day (TID) | RESPIRATORY_TRACT | Status: DC
  Administered 2022-12-07 – 2022-12-08 (×4): 0.63 mg via RESPIRATORY_TRACT
  Filled 2022-12-07 (×4): qty 3

## 2022-12-07 MED ORDER — PREDNISONE 20 MG PO TABS
40.0000 mg | ORAL_TABLET | Freq: Every day | ORAL | Status: DC
  Administered 2022-12-07 – 2022-12-08 (×2): 40 mg via ORAL
  Filled 2022-12-07 (×2): qty 2

## 2022-12-07 NOTE — Plan of Care (Signed)
  Problem: Respiratory: Goal: Levels of oxygenation will improve Outcome: Progressing Goal: Ability to maintain adequate ventilation will improve Outcome: Progressing   Problem: Education: Goal: Knowledge of General Education information will improve Description: Including pain rating scale, medication(s)/side effects and non-pharmacologic comfort measures Outcome: Progressing   Problem: Respiratory: Goal: Ability to maintain adequate ventilation will improve Outcome: Progressing

## 2022-12-07 NOTE — Plan of Care (Signed)
  Problem: Education: Goal: Knowledge of disease or condition will improve Outcome: Progressing Goal: Knowledge of the prescribed therapeutic regimen will improve Outcome: Progressing Goal: Individualized Educational Video(s) Outcome: Progressing   Problem: Activity: Goal: Ability to tolerate increased activity will improve Outcome: Progressing Goal: Will verbalize the importance of balancing activity with adequate rest periods Outcome: Progressing   Problem: Respiratory: Goal: Ability to maintain a clear airway will improve Outcome: Progressing Goal: Levels of oxygenation will improve Outcome: Progressing Goal: Ability to maintain adequate ventilation will improve Outcome: Progressing   Problem: Education: Goal: Knowledge of General Education information will improve Description: Including pain rating scale, medication(s)/side effects and non-pharmacologic comfort measures Outcome: Progressing   Problem: Health Behavior/Discharge Planning: Goal: Ability to manage health-related needs will improve Outcome: Progressing   Problem: Clinical Measurements: Goal: Ability to maintain clinical measurements within normal limits will improve Outcome: Progressing Goal: Will remain free from infection Outcome: Progressing Goal: Diagnostic test results will improve Outcome: Progressing Goal: Respiratory complications will improve Outcome: Progressing Goal: Cardiovascular complication will be avoided Outcome: Progressing   Problem: Activity: Goal: Risk for activity intolerance will decrease Outcome: Progressing   Problem: Nutrition: Goal: Adequate nutrition will be maintained Outcome: Progressing   Problem: Coping: Goal: Level of anxiety will decrease Outcome: Progressing   Problem: Elimination: Goal: Will not experience complications related to bowel motility Outcome: Progressing Goal: Will not experience complications related to urinary retention Outcome: Progressing    Problem: Pain Management: Goal: General experience of comfort will improve Outcome: Progressing   Problem: Safety: Goal: Ability to remain free from injury will improve Outcome: Progressing   Problem: Skin Integrity: Goal: Risk for impaired skin integrity will decrease Outcome: Progressing

## 2022-12-07 NOTE — Progress Notes (Signed)
PROGRESS NOTE        PATIENT DETAILS Name: Johnny Rodriguez Age: 72 y.o. Sex: male Date of Birth: 07/17/1950 Admit Date: 12/05/2022 Admitting Physician John Giovanni, MD [id number], Amy J, NP  Brief Summary: Patient is a 72 y.o.  male with history of COPD-on home O2-3 L, PAF, chronic HFrEF, HTN-who presented to the hospital with shortness of breath (ran out of inhalers 1 week prior)-found to have COPD exacerbation-initially required BiPAP-subsequently admitted to the hospitalist service.  Significant events: 10/25>> admit to [facility]  Significant studies: 10/25>> CXR: No pneumonia  Significant microbiology data: 10/25>> COVID/influenza/RSV PCR: Negative  Procedures: None  Consults: None  Subjective: Lying comfortably in bed-denies any chest pain or shortness of breath.  Feels much better-has not yet ambulated.  Objective: Vitals: Blood pressure 113/61, pulse 93, temperature 97.9 F (36.6 C), temperature source Oral, resp. rate (!) 22, SpO2 93%.   Exam: Gen Exam:Alert awake-not in any distress HEENT:atraumatic, normocephalic Chest: B/L clear to auscultation anteriorly-only a few scattered rhonchi. CVS:S1S2 regular Abdomen:soft non tender, non distended Extremities:no edema Neurology: Non focal Skin: no rash  Pertinent Labs/Radiology:    Latest Ref Rng & Units 12/07/2022    3:59 AM 12/05/2022    6:26 PM 12/05/2022    6:17 PM  CBC  WBC 4.0 - 10.5 K/uL 21.1   8.7   Hemoglobin 13.0 - 17.0 g/dL 78.4  69.6  29.5   Hematocrit 39.0 - 52.0 % 43.0  50.0  48.1   Platelets 150 - 400 K/uL 177   204     Lab Results  Component Value Date   NA 136 12/07/2022   K 4.3 12/07/2022   CL 93 (L) 12/07/2022   CO2 32 12/07/2022     Assessment/Plan: Acute on chronic hypoxemic and hypercarbic respiratory failure due to COPD exacerbation Initially required BiPAP-rapidly improving-back to usual 3 L of oxygen Although rapidly improved-has not yet  ambulated-not yet back to his usual baseline Change to oral steroids Continue nebulized bronchodilators Empiric Zithromax x 3 days total Mobilize/pulmonary toileting TOC team eval for discharge planning If clinical improvement continues-likely home on 10/28.  Leukocytosis Secondary to steroids No clinical evidence of infection  PAF with RVR Rate controlled Metoprolol/Eliquis  CAD No anginal symptoms  Pulmonary embolism On Eliquis  Chronic HFrEF Euvolemic  Chronic pain syndrome Neurontin  Tobacco abuse smoking cessation counseling  BMI: Estimated body mass index is 26.5 kg/m as calculated from the following:   Height as of 11/17/22: 5\' 11"  (1.803 m).   Weight as of 11/17/22: 86.2 kg.   Code status:   Code Status: Do not attempt resuscitation (DNR) PRE-ARREST INTERVENTIONS DESIRED   DVT Prophylaxis: apixaban (ELIQUIS) tablet 5 mg    Family Communication: None at bedside   Disposition Plan: Status is: Inpatient Remains inpatient appropriate because: Severity of illness   Planned Discharge Destination:Home   Diet: Diet Order             DIET SOFT Fluid consistency: Thin  Diet effective now                     Antimicrobial agents: Anti-infectives (From admission, onward)    Start     Dose/Rate Route Frequency Ordered Stop   12/06/22 1000  azithromycin (ZITHROMAX) tablet 250 mg        250 mg Oral Daily 12/05/22  2346 12/10/22 0959   12/05/22 1830  azithromycin (ZITHROMAX) 500 mg in sodium chloride 0.9 % 250 mL IVPB        500 mg 250 mL/hr over 60 Minutes Intravenous  Once 12/05/22 1824 12/05/22 2312        MEDICATIONS: Scheduled Meds:  apixaban  5 mg Oral BID   aspirin EC  81 mg Oral Daily   azithromycin  250 mg Oral Daily   budesonide (PULMICORT) nebulizer solution  0.25 mg Nebulization BID   folic acid  1 mg Oral Daily   gabapentin  400 mg Oral TID   ipratropium  0.5 mg Nebulization TID   levalbuterol  0.63 mg Nebulization TID    methylPREDNISolone (SOLU-MEDROL) injection  60 mg Intravenous Q12H   metoprolol succinate  50 mg Oral Daily   Continuous Infusions: PRN Meds:.acetaminophen **OR** acetaminophen, dextromethorphan-guaiFENesin, metoprolol tartrate, pantoprazole, polyethylene glycol, sodium chloride   I have personally reviewed following labs and imaging studies  LABORATORY DATA: CBC: Recent Labs  Lab 12/05/22 1817 12/05/22 1826 12/07/22 0359  WBC 8.7  --  21.1*  HGB 15.3 17.0 13.8  HCT 48.1 50.0 43.0  MCV 93.2  --  95.3  PLT 204  --  177    Basic Metabolic Panel: Recent Labs  Lab 12/05/22 1817 12/05/22 1826 12/07/22 0359  NA 136 133* 136  K 4.5 6.1* 4.3  CL 93*  --  93*  CO2 30  --  32  GLUCOSE 115*  --  156*  BUN 17  --  20  CREATININE 1.14  --  1.03  CALCIUM 9.3  --  8.9    GFR: CrCl cannot be calculated (Unknown ideal weight.).  Liver Function Tests: No results for input(s): "AST", "ALT", "ALKPHOS", "BILITOT", "PROT", "ALBUMIN" in the last 168 hours. No results for input(s): "LIPASE", "AMYLASE" in the last 168 hours. No results for input(s): "AMMONIA" in the last 168 hours.  Coagulation Profile: No results for input(s): "INR", "PROTIME" in the last 168 hours.  Cardiac Enzymes: No results for input(s): "CKTOTAL", "CKMB", "CKMBINDEX", "TROPONINI" in the last 168 hours.  BNP (last 3 results) No results for input(s): "PROBNP" in the last 8760 hours.  Lipid Profile: No results for input(s): "CHOL", "HDL", "LDLCALC", "TRIG", "CHOLHDL", "LDLDIRECT" in the last 72 hours.  Thyroid Function Tests: No results for input(s): "TSH", "T4TOTAL", "FREET4", "T3FREE", "THYROIDAB" in the last 72 hours.  Anemia Panel: No results for input(s): "VITAMINB12", "FOLATE", "FERRITIN", "TIBC", "IRON", "RETICCTPCT" in the last 72 hours.  Urine analysis:    Component Value Date/Time   COLORURINE YELLOW 11/18/2022 1120   APPEARANCEUR CLEAR 11/18/2022 1120   LABSPEC 1.015 11/18/2022 1120    PHURINE 5.0 11/18/2022 1120   GLUCOSEU NEGATIVE 11/18/2022 1120   HGBUR SMALL (A) 11/18/2022 1120   BILIRUBINUR NEGATIVE 11/18/2022 1120   KETONESUR NEGATIVE 11/18/2022 1120   PROTEINUR 30 (A) 11/18/2022 1120   UROBILINOGEN 0.2 12/08/2012 1516   NITRITE NEGATIVE 11/18/2022 1120   LEUKOCYTESUR NEGATIVE 11/18/2022 1120    Sepsis Labs: Lactic Acid, Venous    Component Value Date/Time   LATICACIDVEN 1.3 03/30/2017 1023    MICROBIOLOGY: Recent Results (from the past 240 hour(s))  Resp panel by RT-PCR (RSV, Flu A&B, Covid) Anterior Nasal Swab     Status: None   Collection Time: 12/06/22 12:29 AM   Specimen: Anterior Nasal Swab  Result Value Ref Range Status   SARS Coronavirus 2 by RT PCR NEGATIVE NEGATIVE Final   Influenza A by PCR NEGATIVE NEGATIVE  Final   Influenza B by PCR NEGATIVE NEGATIVE Final    Comment: (NOTE) The Xpert Xpress SARS-CoV-2/FLU/RSV plus assay is intended as an aid in the diagnosis of influenza from Nasopharyngeal swab specimens and should not be used as a sole basis for treatment. Nasal washings and aspirates are unacceptable for Xpert Xpress SARS-CoV-2/FLU/RSV testing.  Fact Sheet for Patients: BloggerCourse.com  Fact Sheet for Healthcare Providers: SeriousBroker.it  This test is not yet approved or cleared by the [place] FDA and has been authorized for detection and/or diagnosis of SARS-CoV-2 by FDA under an Emergency Use Authorization (EUA). This EUA will remain in effect (meaning this test can be used) for the duration of the COVID-19 declaration under Section 564(b)(1) of the Act, 21 U.S.C. section 360bbb-3(b)(1), unless the authorization is terminated or revoked.     Resp Syncytial Virus by PCR NEGATIVE NEGATIVE Final    Comment: (NOTE) Fact Sheet for Patients: BloggerCourse.com  Fact Sheet for Healthcare  Providers: SeriousBroker.it  This test is not yet approved or cleared by the [place] FDA and has been authorized for detection and/or diagnosis of SARS-CoV-2 by FDA under an Emergency Use Authorization (EUA). This EUA will remain in effect (meaning this test can be used) for the duration of the COVID-19 declaration under Section 564(b)(1) of the Act, 21 U.S.C. section 360bbb-3(b)(1), unless the authorization is terminated or revoked.  Performed at [facility], [address], [place], [place] [address]     RADIOLOGY STUDIES/RESULTS: DG Chest Portable 1 View  Result Date: 12/05/2022 CLINICAL DATA:  Shortness of breath EXAM: PORTABLE CHEST 1 VIEW COMPARISON:  Radiograph 11/18/2022 FINDINGS: Hyperinflation and chronic bronchitic changes. No focal consolidation, pleural effusion, or pneumothorax. Stable cardiomediastinal silhouette. Aortic atherosclerotic calcification. Remote left rib fractures. IMPRESSION: No acute cardiopulmonary process.  Emphysema. Electronically Signed   By: Minerva Fester M.D.   On: 12/05/2022 20:08     LOS: 2 days   Jeoffrey Massed, MD  [facility]    To contact the attending provider between 7A-7P or the covering provider during after hours 7P-7A, please log into the web site [email or web address] and access using universal [employer] password for that web site. If you do not have the password, please call the hospital operator.  12/07/2022, 8:12 AM

## 2022-12-07 NOTE — Plan of Care (Signed)
  Problem: Education: Goal: Knowledge of disease or condition will improve Outcome: Progressing   Problem: Activity: Goal: Ability to tolerate increased activity will improve Outcome: Progressing   Problem: Respiratory: Goal: Ability to maintain a clear airway will improve Outcome: Progressing Goal: Levels of oxygenation will improve Outcome: Progressing Goal: Ability to maintain adequate ventilation will improve Outcome: Progressing   Problem: Education: Goal: Knowledge of General Education information will improve Description: Including pain rating scale, medication(s)/side effects and non-pharmacologic comfort measures Outcome: Progressing   Problem: Health Behavior/Discharge Planning: Goal: Ability to manage health-related needs will improve Outcome: Progressing   Problem: Clinical Measurements: Goal: Ability to maintain clinical measurements within normal limits will improve Outcome: Progressing   Problem: Activity: Goal: Risk for activity intolerance will decrease Outcome: Progressing   Problem: Nutrition: Goal: Adequate nutrition will be maintained Outcome: Progressing   Problem: Coping: Goal: Level of anxiety will decrease Outcome: Progressing   Problem: Elimination: Goal: Will not experience complications related to bowel motility Outcome: Progressing

## 2022-12-08 ENCOUNTER — Other Ambulatory Visit (HOSPITAL_COMMUNITY): Payer: Self-pay

## 2022-12-08 DIAGNOSIS — I4891 Unspecified atrial fibrillation: Secondary | ICD-10-CM | POA: Diagnosis not present

## 2022-12-08 DIAGNOSIS — I10 Essential (primary) hypertension: Secondary | ICD-10-CM

## 2022-12-08 DIAGNOSIS — I5022 Chronic systolic (congestive) heart failure: Secondary | ICD-10-CM | POA: Diagnosis not present

## 2022-12-08 DIAGNOSIS — J441 Chronic obstructive pulmonary disease with (acute) exacerbation: Secondary | ICD-10-CM | POA: Diagnosis not present

## 2022-12-08 MED ORDER — SPIRIVA RESPIMAT 2.5 MCG/ACT IN AERS
2.0000 | INHALATION_SPRAY | Freq: Every day | RESPIRATORY_TRACT | 2 refills | Status: DC
  Filled 2022-12-08: qty 4, 30d supply, fill #0

## 2022-12-08 MED ORDER — FOLIC ACID 1 MG PO TABS
1.0000 mg | ORAL_TABLET | Freq: Every day | ORAL | 0 refills | Status: DC
  Filled 2022-12-08: qty 30, 30d supply, fill #0

## 2022-12-08 MED ORDER — TRAZODONE HCL 50 MG PO TABS
50.0000 mg | ORAL_TABLET | Freq: Every evening | ORAL | 0 refills | Status: DC | PRN
  Filled 2022-12-08: qty 30, 30d supply, fill #0

## 2022-12-08 MED ORDER — PANTOPRAZOLE SODIUM 40 MG PO TBEC
40.0000 mg | DELAYED_RELEASE_TABLET | Freq: Every day | ORAL | 2 refills | Status: DC | PRN
  Filled 2022-12-08: qty 30, 30d supply, fill #0

## 2022-12-08 MED ORDER — METOPROLOL SUCCINATE ER 50 MG PO TB24
50.0000 mg | ORAL_TABLET | Freq: Every day | ORAL | 3 refills | Status: DC
  Filled 2022-12-08: qty 90, 90d supply, fill #0

## 2022-12-08 MED ORDER — ALBUTEROL SULFATE (2.5 MG/3ML) 0.083% IN NEBU
2.5000 mg | INHALATION_SOLUTION | RESPIRATORY_TRACT | 12 refills | Status: DC | PRN
  Filled 2022-12-08: qty 450, 25d supply, fill #0

## 2022-12-08 MED ORDER — FLUTICASONE FUROATE-VILANTEROL 100-25 MCG/ACT IN AEPB
1.0000 | INHALATION_SPRAY | Freq: Every day | RESPIRATORY_TRACT | 2 refills | Status: DC
  Filled 2022-12-08: qty 60, 60d supply, fill #0

## 2022-12-08 MED ORDER — ELIQUIS 5 MG PO TABS
5.0000 mg | ORAL_TABLET | Freq: Two times a day (BID) | ORAL | 2 refills | Status: DC
  Filled 2022-12-08: qty 60, 30d supply, fill #0

## 2022-12-08 MED ORDER — ASPIRIN 81 MG PO TBEC
81.0000 mg | DELAYED_RELEASE_TABLET | Freq: Every day | ORAL | 12 refills | Status: AC
  Filled 2022-12-08: qty 30, 30d supply, fill #0

## 2022-12-08 MED ORDER — GABAPENTIN 400 MG PO CAPS
400.0000 mg | ORAL_CAPSULE | Freq: Three times a day (TID) | ORAL | 2 refills | Status: DC
  Filled 2022-12-08: qty 90, 30d supply, fill #0

## 2022-12-08 MED ORDER — VENTOLIN HFA 108 (90 BASE) MCG/ACT IN AERS
INHALATION_SPRAY | RESPIRATORY_TRACT | 2 refills | Status: DC
  Filled 2022-12-08: qty 18, 30d supply, fill #0

## 2022-12-08 MED ORDER — PREDNISONE 10 MG PO TABS
ORAL_TABLET | ORAL | 0 refills | Status: DC
  Filled 2022-12-08: qty 10, 4d supply, fill #0

## 2022-12-08 NOTE — Progress Notes (Signed)
PT Cancellation Note  Patient Details Name: Nivaan Fuhr MRN: [id number] DOB: [birth year]   Cancelled Treatment:    Reason Eval/Treat Not Completed: Other (comment). Pt being wheeled out. Per pt he has electric wheelchair and rollator at home.    Angelina Ok [facility] 12/08/2022, 12:38 PM Skip Mayer PT Acute [employer] [phone]

## 2022-12-08 NOTE — TOC Transition Note (Addendum)
Transition of Care [facility]) - CM/SW Discharge Note   Patient Details  Name: Johnny Rodriguez MRN: [id number] Date of Birth: 03/26/1950  Transition of Care [facility]) CM/SW Contact:  Gordy Clement, RN Phone Number: 12/08/2022, 9:13 AM   Clinical Narrative:     Update :  Patient does not qualify for  a [place] thru insurance due to receiving equipment earlier thru insurance.  Patient has no means to pay the $25 for cane  CM has requested LOG to get cane through [employer]. Unfortunately, patient is insured so LOG cannot apply.  CM revissitted with Patient and he states he can get safely from the cab using the portable o2 carrier.    Patient to dc to home. Will need non emergency transport.  [employer] will be delivering a portable O2 to the room as well as a cane . Home Health PT and OT  has been ordered and Patient has previously been with [facility] and they will provide PT and OT again. AVS updated  No additional TOC needs              Patient Goals and CMS Choice      Discharge Placement                         Discharge Plan and Services Additional resources added to the After Visit Summary for                                       Social Determinants of Health (SDOH) Interventions SDOH Screenings   Food Insecurity: No Food Insecurity (12/06/2022)  Housing: Low Risk  (12/06/2022)  Transportation Needs: No Transportation Needs (12/06/2022)  Utilities: Not At Risk (12/06/2022)  Alcohol Screen: Low Risk  (09/11/2020)  Recent Concern: Alcohol Screen - Medium Risk (08/07/2020)  Depression (PHQ2-9): Low Risk  (11/21/2022)  Financial Resource Strain: Low Risk  (11/21/2022)  Physical Activity: Inactive (11/21/2022)  Social Connections: Socially Isolated (11/21/2022)  Stress: No Stress Concern Present (11/21/2022)  Tobacco Use: High Risk (12/05/2022)  Health Literacy: Adequate Health Literacy (11/21/2022)     Readmission Risk Interventions     No data to display

## 2022-12-08 NOTE — Discharge Summary (Signed)
PATIENT DETAILS Name: Johnny Rodriguez Age: 72 y.o. Sex: male Date of Birth: 1950/07/27 MRN: [id number]. Admitting Physician: John Giovanni, MD BMW:[id number], Salomon Fick, NP  Admit Date: 12/05/2022 Discharge date: 12/08/2022  Recommendations for Outpatient Follow-up:  Follow up with PCP in 1-2 weeks Please obtain CMP/CBC in one week  Admitted From:  Home  Disposition: Home health   Discharge Condition: good  CODE STATUS:   Code Status: Do not attempt resuscitation (DNR) PRE-ARREST INTERVENTIONS DESIRED   Diet recommendation:  Diet Order             Diet - low sodium heart healthy           Diet Heart Room service appropriate? No; Fluid consistency: Thin; Fluid restriction: 1500 mL Fluid  Diet effective now                    Brief Summary: Patient is a 72 y.o.  male with history of COPD-on home O2-3 L, PAF, chronic HFrEF, HTN-who presented to the hospital with shortness of breath (ran out of inhalers 1 week prior)-found to have COPD exacerbation-initially required BiPAP-subsequently admitted to the hospitalist service.   Significant events: 10/25>> admit to [facility]   Significant studies: 10/25>> CXR: No pneumonia   Significant microbiology data: 10/25>> COVID/influenza/RSV PCR: Negative   Procedures: None   Consults: None    Brief Hospital Course: Acute on chronic hypoxemic and hypercarbic respiratory failure due to COPD exacerbation Initially required BiPAP-rapidly improved with steroids/bronchodilators/empiric Zithromax-now back to baseline On exam this morning hardly any wheezing Stable on 3 L of oxygen Plan is to resume his usual inhaler regimen (ran out of inhaler several weeks prior to this hospitalization), taper steroids over the next several days and have him follow-up with his primary care practitioner.    Leukocytosis Secondary to steroids No clinical evidence of infection   PAF with RVR Rate controlled Metoprolol/Eliquis   CAD No  anginal symptoms   Pulmonary embolism On Eliquis   Chronic HFrEF Euvolemic   Chronic pain syndrome Neurontin   Tobacco abuse smoking cessation counseling  Debility/deconditioning Mostly uses a wheelchair for long distances-and uses a walker to walk at home. PT eval prior to discharge Home health services ordered.   BMI: Estimated body mass index is 26.5 kg/m as calculated from the following:   Height as of 11/17/22: 5\' 11"  (1.803 m).   Weight as of 11/17/22: 86.2 kg.    Discharge Diagnoses:  Principal Problem:   COPD with acute exacerbation (HCC) Active Problems:   CAD (coronary artery disease)   Chronic systolic heart failure (HCC)   Essential hypertension   Acute on chronic respiratory failure with hypoxia and hypercapnia (HCC)   Cigarette smoker   Atrial fibrillation with RVR [facility])   Discharge Instructions:  Activity:  As tolerated with Full fall precautions use walker/cane & assistance as needed W  Discharge Instructions     Call MD for:  difficulty breathing, headache or visual disturbances   Complete by: As directed    Call MD for:  persistant dizziness or light-headedness   Complete by: As directed    Diet - low sodium heart healthy   Complete by: As directed    Discharge instructions   Complete by: As directed    Follow with Primary MD  Rema Fendt, NP in 1-2 weeks  Please get a complete blood count and chemistry panel checked by your Primary MD at your next visit, and again as instructed by your  Primary MD.  Get Medicines reviewed and adjusted: Please take all your medications with you for your next visit with your Primary MD  Laboratory/radiological data: Please request your Primary MD to go over all hospital tests and procedure/radiological results at the follow up, please ask your Primary MD to get all Hospital records sent to his/her office.  In some cases, they will be blood work, cultures and biopsy results pending at the time of your  discharge. Please request that your primary care M.D. follows up on these results.  Also Note the following: If you experience worsening of your admission symptoms, develop shortness of breath, life threatening emergency, suicidal or homicidal thoughts you must seek medical attention immediately by calling 911 or calling your MD immediately  if symptoms less severe.  You must read complete instructions/literature along with all the possible adverse reactions/side effects for all the Medicines you take and that have been prescribed to you. Take any new Medicines after you have completely understood and accpet all the possible adverse reactions/side effects.   Do not drive when taking Pain medications or sleeping medications (Benzodaizepines)  Do not take more than prescribed Pain, Sleep and Anxiety Medications. It is not advisable to combine anxiety,sleep and pain medications without talking with your primary care practitioner  Special Instructions: If you have smoked or chewed Tobacco  in the last 2 yrs please stop smoking, stop any regular Alcohol  and or any Recreational drug use.  Wear Seat belts while driving.  Please note: You were cared for by a hospitalist during your hospital stay. Once you are discharged, your primary care physician will handle any further medical issues. Please note that NO REFILLS for any discharge medications will be authorized once you are discharged, as it is imperative that you return to your primary care physician (or establish a relationship with a primary care physician if you do not have one) for your post hospital discharge needs so that they can reassess your need for medications and monitor your lab values.   Increase activity slowly   Complete by: As directed       Allergies as of 12/08/2022   No Known Allergies      Medication List     STOP taking these medications    meloxicam 15 MG tablet Commonly known as: MOBIC       TAKE these  medications    albuterol (2.5 MG/3ML) 0.083% nebulizer solution Commonly known as: PROVENTIL Take 3 mLs (2.5 mg total) by nebulization every 4 (four) hours as needed for wheezing or shortness of breath.   Ventolin HFA 108 (90 Base) MCG/ACT inhaler Generic drug: albuterol INHALE 1-2 PUFFS INTO THE LUNGS EVERY 6 HOURS AS NEEDED FOR WHEEZING OR SHORTNESS OF BREATH   aspirin EC 81 MG tablet Take 1 tablet (81 mg total) by mouth daily. Swallow whole.   Eliquis 5 MG Tabs tablet Generic drug: apixaban Take 1 tablet (5 mg total) by mouth 2 (two) times daily.   fluticasone furoate-vilanterol 100-25 MCG/ACT Aepb Commonly known as: Breo Ellipta Inhale 1 puff into the lungs daily.   folic acid 1 MG tablet Commonly known as: FOLVITE Take 1 tablet (1 mg total) by mouth daily.   gabapentin 400 MG capsule Commonly known as: NEURONTIN Take 1 capsule (400 mg total) by mouth 3 (three) times daily.   metoprolol succinate 50 MG 24 hr tablet Commonly known as: TOPROL-XL Take 1 tablet (50 mg total) by mouth daily. Take with or immediately following a  meal.   OXYGEN Inhale 3 L into the lungs continuous.   pantoprazole 40 MG tablet Commonly known as: PROTONIX Take 1 tablet (40 mg total) by mouth daily as needed (acid reflux).   polyethylene glycol 17 g packet Commonly known as: MIRALAX / GLYCOLAX Take 17 g by mouth daily. What changed:  when to take this reasons to take this   predniSONE 10 MG tablet Commonly known as: DELTASONE Take 40 mg daily for 1 day, 30 mg daily for 1 day, 20 mg daily for 1 days,10 mg daily for 1 day, then stop   senna 8.6 MG Tabs tablet Commonly known as: SENOKOT Take 1-2 tablets (8.6-17.2 mg total) by mouth daily. What changed:  when to take this reasons to take this   Spiriva Respimat 2.5 MCG/ACT Aers Generic drug: Tiotropium Bromide Monohydrate Inhale 2 puffs into the lungs daily.   traZODone 50 MG tablet Commonly known as: DESYREL Take 1 tablet (50 mg  total) by mouth at bedtime as needed for sleep.        Follow-up Information     Rema Fendt, NP. Schedule an appointment as soon as possible for a visit in 1 week(s).   Specialty: Nurse Practitioner Contact information: [address] [place] [address] [phone]                No Known Allergies   Other Procedures/Studies: DG Chest Portable 1 View  Result Date: 12/05/2022 CLINICAL DATA:  Shortness of breath EXAM: PORTABLE CHEST 1 VIEW COMPARISON:  Radiograph 11/18/2022 FINDINGS: Hyperinflation and chronic bronchitic changes. No focal consolidation, pleural effusion, or pneumothorax. Stable cardiomediastinal silhouette. Aortic atherosclerotic calcification. Remote left rib fractures. IMPRESSION: No acute cardiopulmonary process.  Emphysema. Electronically Signed   By: Minerva Fester M.D.   On: 12/05/2022 20:08   CT ABDOMEN PELVIS W CONTRAST  Result Date: 11/18/2022 CLINICAL DATA:  Epigastric pain and constipation. EXAM: CT ABDOMEN AND PELVIS WITH CONTRAST TECHNIQUE: Multidetector CT imaging of the abdomen and pelvis was performed using the standard protocol following bolus administration of intravenous contrast. RADIATION DOSE REDUCTION: This exam was performed according to the departmental dose-optimization program which includes automated exposure control, adjustment of the mA and/or kV according to patient size and/or use of iterative reconstruction technique. CONTRAST:  75mL OMNIPAQUE IOHEXOL 350 MG/ML SOLN COMPARISON:  12/16/2019 FINDINGS: Lower chest: Emphysematous lung disease. Hepatobiliary: No focal liver abnormality is seen. No gallstones, gallbladder wall thickening, or biliary dilatation. Pancreas: Unremarkable. No pancreatic ductal dilatation or surrounding inflammatory changes. Spleen: Normal in size without focal abnormality. Adrenals/Urinary Tract: Stable mild nodularity and thickening of bilateral adrenal glands which is stable dating back to  2021. Kidneys are normal, without renal calculi, focal lesion, or hydronephrosis. Bladder is unremarkable. Stomach/Bowel: Bowel shows no evidence of obstruction, ileus, inflammation or lesion. The appendix is normal. No free intraperitoneal air. Vascular/Lymphatic: Atherosclerosis of the abdominal aorta without aneurysm. No lymphadenopathy identified. Reproductive: Prostate is unremarkable. Other: Stable small left inguinal hernia containing fat. Musculoskeletal: No acute or significant osseous findings. IMPRESSION: 1. No acute findings in the abdomen or pelvis. 2. Emphysematous lung disease. 3. Stable mild nodularity and thickening of bilateral adrenal glands. 4. Stable small left inguinal hernia containing fat. 5. Aortic atherosclerosis. Electronically Signed   By: Irish Lack M.D.   On: 11/18/2022 18:41   DG Chest 1 View  Result Date: 11/18/2022 CLINICAL DATA:  Chest pain.  Shortness of breath. EXAM: CHEST  1 VIEW COMPARISON:  11/17/2022. FINDINGS: Essentially stable  exam. Redemonstration of linear areas of atelectasis and/or scarring in the bilateral upper lung zones, similar to the prior study. There is a opacity in the left lower lung zone partially obscuring the left heart border, which may represent atelectasis and/or pneumonitis in the left lung lower lobe. Bilateral lung fields are otherwise clear. Bilateral costophrenic angles are clear. Normal cardio-mediastinal silhouette. No acute osseous abnormalities. The soft tissues are within normal limits. IMPRESSION: 1. Left lower lobe atelectasis and/or pneumonitis. Electronically Signed   By: Jules Schick M.D.   On: 11/18/2022 11:16   DG Chest 2 View  Result Date: 11/17/2022 CLINICAL DATA:  Chest pain. EXAM: CHEST - 2 VIEW COMPARISON:  March 26, 2022. FINDINGS: The heart size and mediastinal contours are within normal limits. Both lungs are clear. The visualized skeletal structures are unremarkable. IMPRESSION: No active cardiopulmonary  disease. Electronically Signed   By: Lupita Raider M.D.   On: 11/17/2022 15:16     TODAY-DAY OF DISCHARGE:  Subjective:   Denzale Moya today has no headache,no chest abdominal pain,no new weakness tingling or numbness, feels much better wants to go home today.   Objective:   Blood pressure 116/72, pulse 78, temperature 97.8 F (36.6 C), temperature source Oral, resp. rate 16, SpO2 98%.  Intake/Output Summary (Last 24 hours) at 12/08/2022 0813 Last data filed at 12/07/2022 1830 Gross per 24 hour  Intake --  Output 400 ml  Net -400 ml   There were no vitals filed for this visit.  Exam: Awake Alert, Oriented *3, No new F.N deficits, Normal affect Johnny Rodriguez.AT,PERRAL Supple Neck,No JVD, No cervical lymphadenopathy appriciated.  Symmetrical Chest wall movement, Good air movement bilaterally, CTAB RRR,No Gallops,Rubs or new Murmurs, No Parasternal Heave +ve B.Sounds, Abd Soft, Non tender, No organomegaly appriciated, No rebound -guarding or rigidity. No Cyanosis, Clubbing or edema, No new Rash or bruise   PERTINENT RADIOLOGIC STUDIES: No results found.   PERTINENT LAB RESULTS: CBC: Recent Labs    12/05/22 1817 12/05/22 1826 12/07/22 0359  WBC 8.7  --  21.1*  HGB 15.3 17.0 13.8  HCT 48.1 50.0 43.0  PLT 204  --  177   CMET CMP     Component Value Date/Time   NA 136 12/07/2022 0359   NA 147 (H) 08/15/2022 1420   NA 139 02/05/2014 2217   K 4.3 12/07/2022 0359   K 4.2 02/05/2014 2217   CL 93 (L) 12/07/2022 0359   CL 104 02/05/2014 2217   CO2 32 12/07/2022 0359   CO2 28 02/05/2014 2217   GLUCOSE 156 (H) 12/07/2022 0359   GLUCOSE 91 02/05/2014 2217   BUN 20 12/07/2022 0359   BUN 16 08/15/2022 1420   BUN 18 02/05/2014 2217   CREATININE 1.03 12/07/2022 0359   CREATININE 1.10 02/05/2014 2217   CALCIUM 8.9 12/07/2022 0359   CALCIUM 8.8 02/05/2014 2217   PROT 7.6 11/18/2022 0945   PROT 6.6 08/15/2022 1420   ALBUMIN 3.6 11/18/2022 0945   ALBUMIN 4.0 08/15/2022 1420    AST 21 11/18/2022 0945   ALT 14 11/18/2022 0945   ALKPHOS 71 11/18/2022 0945   BILITOT 0.7 11/18/2022 0945   BILITOT 0.4 08/15/2022 1420   GFR 65.71 12/10/2011 1155   EGFR 81 08/15/2022 1420   GFRNONAA >60 12/07/2022 0359   GFRNONAA >60 02/05/2014 2217    GFR CrCl cannot be calculated (Unknown ideal weight.). No results for input(s): "LIPASE", "AMYLASE" in the last 72 hours. No results for input(s): "CKTOTAL", "CKMB", "CKMBINDEX", "TROPONINI"  in the last 72 hours. Invalid input(s): "POCBNP" Recent Labs    12/06/22 0029  DDIMER <0.27   No results for input(s): "HGBA1C" in the last 72 hours. No results for input(s): "CHOL", "HDL", "LDLCALC", "TRIG", "CHOLHDL", "LDLDIRECT" in the last 72 hours. No results for input(s): "TSH", "T4TOTAL", "T3FREE", "THYROIDAB" in the last 72 hours.  Invalid input(s): "FREET3" No results for input(s): "VITAMINB12", "FOLATE", "FERRITIN", "TIBC", "IRON", "RETICCTPCT" in the last 72 hours. Coags: No results for input(s): "INR" in the last 72 hours.  Invalid input(s): "PT" Microbiology: Recent Results (from the past 240 hour(s))  Resp panel by RT-PCR (RSV, Flu A&B, Covid) Anterior Nasal Swab     Status: None   Collection Time: 12/06/22 12:29 AM   Specimen: Anterior Nasal Swab  Result Value Ref Range Status   SARS Coronavirus 2 by RT PCR NEGATIVE NEGATIVE Final   Influenza A by PCR NEGATIVE NEGATIVE Final   Influenza B by PCR NEGATIVE NEGATIVE Final    Comment: (NOTE) The Xpert Xpress SARS-CoV-2/FLU/RSV plus assay is intended as an aid in the diagnosis of influenza from Nasopharyngeal swab specimens and should not be used as a sole basis for treatment. Nasal washings and aspirates are unacceptable for Xpert Xpress SARS-CoV-2/FLU/RSV testing.  Fact Sheet for Patients: BloggerCourse.com  Fact Sheet for Healthcare Providers: SeriousBroker.it  This test is not yet approved or cleared by the [place] FDA and has been authorized for detection and/or diagnosis of SARS-CoV-2 by FDA under an Emergency Use Authorization (EUA). This EUA will remain in effect (meaning this test can be used) for the duration of the COVID-19 declaration under Section 564(b)(1) of the Act, 21 U.S.C. section 360bbb-3(b)(1), unless the authorization is terminated or revoked.     Resp Syncytial Virus by PCR NEGATIVE NEGATIVE Final    Comment: (NOTE) Fact Sheet for Patients: BloggerCourse.com  Fact Sheet for Healthcare Providers: SeriousBroker.it  This test is not yet approved or cleared by the [place] FDA and has been authorized for detection and/or diagnosis of SARS-CoV-2 by FDA under an Emergency Use Authorization (EUA). This EUA will remain in effect (meaning this test can be used) for the duration of the COVID-19 declaration under Section 564(b)(1) of the Act, 21 U.S.C. section 360bbb-3(b)(1), unless the authorization is terminated or revoked.  Performed at [facility], [address], [place], [place] [address]     FURTHER DISCHARGE INSTRUCTIONS:  Get Medicines reviewed and adjusted: Please take all your medications with you for your next visit with your Primary MD  Laboratory/radiological data: Please request your Primary MD to go over all hospital tests and procedure/radiological results at the follow up, please ask your Primary MD to get all Hospital records sent to his/her office.  In some cases, they will be blood work, cultures and biopsy results pending at the time of your discharge. Please request that your primary care M.D. goes through all the records of your hospital data and follows up on these results.  Also Note the following: If you experience worsening of your admission symptoms, develop shortness of breath, life threatening emergency, suicidal or homicidal thoughts you must seek medical attention immediately by  calling 911 or calling your MD immediately  if symptoms less severe.  You must read complete instructions/literature along with all the possible adverse reactions/side effects for all the Medicines you take and that have been prescribed to you. Take any new Medicines after you have completely understood and accpet all the possible adverse reactions/side effects.  Do not drive when taking Pain medications or sleeping medications (Benzodaizepines)  Do not take more than prescribed Pain, Sleep and Anxiety Medications. It is not advisable to combine anxiety,sleep and pain medications without talking with your primary care practitioner  Special Instructions: If you have smoked or chewed Tobacco  in the last 2 yrs please stop smoking, stop any regular Alcohol  and or any Recreational drug use.  Wear Seat belts while driving.  Please note: You were cared for by a hospitalist during your hospital stay. Once you are discharged, your primary care physician will handle any further medical issues. Please note that NO REFILLS for any discharge medications will be authorized once you are discharged, as it is imperative that you return to your primary care physician (or establish a relationship with a primary care physician if you do not have one) for your post hospital discharge needs so that they can reassess your need for medications and monitor your lab values.  Total Time spent coordinating discharge including counseling, education and face to face time equals greater than 30 minutes.  SignedJeoffrey Massed 12/08/2022 8:13 AM

## 2022-12-09 ENCOUNTER — Telehealth: Payer: Self-pay

## 2022-12-09 DIAGNOSIS — I10 Essential (primary) hypertension: Secondary | ICD-10-CM | POA: Diagnosis not present

## 2022-12-09 DIAGNOSIS — J449 Chronic obstructive pulmonary disease, unspecified: Secondary | ICD-10-CM | POA: Diagnosis not present

## 2022-12-09 DIAGNOSIS — Z7901 Long term (current) use of anticoagulants: Secondary | ICD-10-CM | POA: Diagnosis not present

## 2022-12-09 DIAGNOSIS — J9611 Chronic respiratory failure with hypoxia: Secondary | ICD-10-CM | POA: Diagnosis not present

## 2022-12-09 DIAGNOSIS — J9600 Acute respiratory failure, unspecified whether with hypoxia or hypercapnia: Secondary | ICD-10-CM | POA: Diagnosis not present

## 2022-12-09 NOTE — Telephone Encounter (Signed)
As per TOC note from today, patient is following up with Dr Darleene Cleaver, and is not going to be seeing Ricky Stabs, NP any longer

## 2022-12-09 NOTE — Transitions of Care (Post Inpatient/ED Visit) (Signed)
12/09/2022  Name: Johnny Rodriguez MRN: [id number] DOB: 1950-09-01  Today's TOC FU Call Status: Today's TOC FU Call Status:: Successful TOC FU Call Completed TOC FU Call Complete Date: 12/09/22 Patient's Name and Date of Birth confirmed.  Transition Care Management Follow-up Telephone Call Date of Discharge: 12/08/22 Discharge Facility: [facility] Type of Discharge: Inpatient Admission Primary Inpatient Discharge Diagnosis:: COPD How have you been since you were released from the hospital?: Same Any questions or concerns?: Yes Patient Questions/Concerns:: Patient reports that he is angry because he has not gotten his meals on wheels today.  Patient reports that he no longer sees [facility]. reports that he sees doctors making house calls. Patient Questions/Concerns Addressed: Provided Patient Educational Materials  Items Reviewed: Did you receive and understand the discharge instructions provided?: Yes Medications obtained,verified, and reconciled?: Yes (Medications Reviewed) Any new allergies since your discharge?: No Dietary orders reviewed?: Yes Type of Diet Ordered:: low salt, 1500 ml fluid restriction Do you have support at home?: No  Medications Reviewed Today: Medications Reviewed Today     Reviewed by Earlie Server, RN (Registered Nurse) on 12/09/22 at 1318  Med List Status: <None>   Medication Order Taking? Sig Documenting Provider Last Dose Status Informant  albuterol (PROVENTIL) (2.5 MG/3ML) 0.083% nebulizer solution [id number]  Take 3 mLs (2.5 mg total) by nebulization every 4 (four) hours as needed for wheezing or shortness of breath. Maretta Bees, MD  Active   aspirin EC 81 MG tablet [id number]  Take 1 tablet (81 mg total) by mouth daily. Swallow whole. Maretta Bees, MD  Active   ELIQUIS 5 MG TABS tablet [id number]  Take 1 tablet (5 mg total) by mouth 2 (two) times daily. Ghimire, Werner Lean, MD  Active   fluticasone furoate-vilanterol (BREO  ELLIPTA) 100-25 MCG/ACT AEPB [id number]  Inhale 1 puff into the lungs daily. Maretta Bees, MD  Active   folic acid (FOLVITE) 1 MG tablet [id number]  Take 1 tablet (1 mg total) by mouth daily. Maretta Bees, MD  Active   gabapentin (NEURONTIN) 400 MG capsule [id number]  Take 1 capsule (400 mg total) by mouth 3 (three) times daily. Maretta Bees, MD  Active   metoprolol succinate (TOPROL-XL) 50 MG 24 hr tablet [id number]  Take 1 tablet (50 mg total) by mouth daily. Take with or immediately following a meal. Maretta Bees, MD  Active   OXYGEN [id number] No Inhale 3 L into the lungs continuous.  [provider] Taking Active Self, Pharmacy Records  pantoprazole (PROTONIX) 40 MG tablet [id number]  Take 1 tablet (40 mg total) by mouth daily as needed (acid reflux). Ghimire, Werner Lean, MD  Active   polyethylene glycol (MIRALAX / GLYCOLAX) 17 g packet [id number] No Take 17 g by mouth daily.  Patient taking differently: Take 17 g by mouth daily as needed for mild constipation.   Dellia Cloud, MD unknown Active Self, Pharmacy Records  predniSONE (DELTASONE) 10 MG tablet [id number]  Take 40 mg daily for 1 day, 30 mg daily for 1 day, 20 mg daily for 1 days,10 mg daily for 1 day, then stop Maretta Bees, MD  Active   senna (SENOKOT) 8.6 MG TABS tablet [id number] No Take 1-2 tablets (8.6-17.2 mg total) by mouth daily.  Patient taking differently: Take 1-2 tablets by mouth daily as needed for moderate constipation.   Dellia Cloud, MD unknown Active Self, Pharmacy Records  [facility] RESPIMAT 2.5 MCG/ACT AERS [id number]  Inhale 2 puffs  into the lungs daily. Maretta Bees, MD  Active   traZODone (DESYREL) 50 MG tablet [id number]  Take 1 tablet (50 mg total) by mouth at bedtime as needed for sleep. Maretta Bees, MD  Active   VENTOLIN HFA 108 [phone] Base) MCG/ACT inhaler [id number]  INHALE 1-2 PUFFS INTO THE LUNGS EVERY 6 HOURS AS NEEDED FOR WHEEZING OR SHORTNESS OF BREATH Ghimire, Werner Lean, MD  Active   Med List Note Lenor Derrick, CPhT 12/10/19 1425): [facility] [phone]           During assessment, patient informs me that he no longer goes to Dr. Ricky Stabs. Reports that he is active with Dr. Darleene Cleaver with Dr. Hedwig Morton house calls.  I placed call to MD at [phone] and confirmed with Duwayne Heck that patient is active with Dr. Darleene Cleaver and has a follow up home visit for today.    Reviewed with patient about eating something else now and saving his meals on wheels for later today.  Patient confirmed that he has all his medications and his nebulizer machine.   Patient is no longer [facility] and not eligible for [facility] program.  Lonia Chimera, RN, BSN, CEN Population Health- Transition of Care Team.  [facility] [phone]

## 2022-12-11 DIAGNOSIS — J9622 Acute and chronic respiratory failure with hypercapnia: Secondary | ICD-10-CM | POA: Diagnosis not present

## 2022-12-11 DIAGNOSIS — J9621 Acute and chronic respiratory failure with hypoxia: Secondary | ICD-10-CM | POA: Diagnosis not present

## 2022-12-11 DIAGNOSIS — I2699 Other pulmonary embolism without acute cor pulmonale: Secondary | ICD-10-CM | POA: Diagnosis not present

## 2022-12-11 DIAGNOSIS — J441 Chronic obstructive pulmonary disease with (acute) exacerbation: Secondary | ICD-10-CM | POA: Diagnosis not present

## 2022-12-11 DIAGNOSIS — I110 Hypertensive heart disease with heart failure: Secondary | ICD-10-CM | POA: Diagnosis not present

## 2022-12-11 DIAGNOSIS — I429 Cardiomyopathy, unspecified: Secondary | ICD-10-CM | POA: Diagnosis not present

## 2022-12-11 DIAGNOSIS — I5022 Chronic systolic (congestive) heart failure: Secondary | ICD-10-CM | POA: Diagnosis not present

## 2022-12-11 DIAGNOSIS — I480 Paroxysmal atrial fibrillation: Secondary | ICD-10-CM | POA: Diagnosis not present

## 2022-12-11 DIAGNOSIS — I2510 Atherosclerotic heart disease of native coronary artery without angina pectoris: Secondary | ICD-10-CM | POA: Diagnosis not present

## 2022-12-15 ENCOUNTER — Telehealth: Payer: Self-pay | Admitting: Family

## 2022-12-15 NOTE — Telephone Encounter (Signed)
Copied from CRM [id number]. Topic: General - Other >> Dec 15, 2022  3:28 PM Turkey B wrote: Reason for CRM: Georgett from [facility] called in , says she will fax in pcs form to fill out for his appt on 11/25, not sure if you already have these in the office

## 2022-12-17 ENCOUNTER — Other Ambulatory Visit: Payer: Self-pay | Admitting: Family

## 2022-12-17 ENCOUNTER — Telehealth: Payer: Self-pay | Admitting: Internal Medicine

## 2022-12-17 DIAGNOSIS — R350 Frequency of micturition: Secondary | ICD-10-CM

## 2022-12-17 DIAGNOSIS — I5022 Chronic systolic (congestive) heart failure: Secondary | ICD-10-CM | POA: Diagnosis not present

## 2022-12-17 DIAGNOSIS — I2510 Atherosclerotic heart disease of native coronary artery without angina pectoris: Secondary | ICD-10-CM | POA: Diagnosis not present

## 2022-12-17 DIAGNOSIS — J9621 Acute and chronic respiratory failure with hypoxia: Secondary | ICD-10-CM | POA: Diagnosis not present

## 2022-12-17 DIAGNOSIS — I480 Paroxysmal atrial fibrillation: Secondary | ICD-10-CM | POA: Diagnosis not present

## 2022-12-17 DIAGNOSIS — J441 Chronic obstructive pulmonary disease with (acute) exacerbation: Secondary | ICD-10-CM | POA: Diagnosis not present

## 2022-12-17 DIAGNOSIS — I429 Cardiomyopathy, unspecified: Secondary | ICD-10-CM | POA: Diagnosis not present

## 2022-12-17 DIAGNOSIS — I110 Hypertensive heart disease with heart failure: Secondary | ICD-10-CM | POA: Diagnosis not present

## 2022-12-17 DIAGNOSIS — I2699 Other pulmonary embolism without acute cor pulmonale: Secondary | ICD-10-CM | POA: Diagnosis not present

## 2022-12-17 DIAGNOSIS — J9622 Acute and chronic respiratory failure with hypercapnia: Secondary | ICD-10-CM | POA: Diagnosis not present

## 2022-12-17 NOTE — Telephone Encounter (Signed)
Complete

## 2022-12-17 NOTE — Telephone Encounter (Signed)
Copied from CRM [id number]. Topic: General - Other >> Dec 17, 2022  9:41 AM Franchot Heidelberg wrote: Reason for CRM: Requesting orders for a bedside commode   Julious Oka [facility]   [phone]

## 2022-12-18 DIAGNOSIS — I480 Paroxysmal atrial fibrillation: Secondary | ICD-10-CM | POA: Diagnosis not present

## 2022-12-18 DIAGNOSIS — J9622 Acute and chronic respiratory failure with hypercapnia: Secondary | ICD-10-CM | POA: Diagnosis not present

## 2022-12-18 DIAGNOSIS — J441 Chronic obstructive pulmonary disease with (acute) exacerbation: Secondary | ICD-10-CM | POA: Diagnosis not present

## 2022-12-18 DIAGNOSIS — I110 Hypertensive heart disease with heart failure: Secondary | ICD-10-CM | POA: Diagnosis not present

## 2022-12-18 DIAGNOSIS — I429 Cardiomyopathy, unspecified: Secondary | ICD-10-CM | POA: Diagnosis not present

## 2022-12-18 DIAGNOSIS — I2510 Atherosclerotic heart disease of native coronary artery without angina pectoris: Secondary | ICD-10-CM | POA: Diagnosis not present

## 2022-12-18 DIAGNOSIS — J9621 Acute and chronic respiratory failure with hypoxia: Secondary | ICD-10-CM | POA: Diagnosis not present

## 2022-12-18 DIAGNOSIS — I2699 Other pulmonary embolism without acute cor pulmonale: Secondary | ICD-10-CM | POA: Diagnosis not present

## 2022-12-18 DIAGNOSIS — I5022 Chronic systolic (congestive) heart failure: Secondary | ICD-10-CM | POA: Diagnosis not present

## 2022-12-19 DIAGNOSIS — J9621 Acute and chronic respiratory failure with hypoxia: Secondary | ICD-10-CM | POA: Diagnosis not present

## 2022-12-19 DIAGNOSIS — J441 Chronic obstructive pulmonary disease with (acute) exacerbation: Secondary | ICD-10-CM | POA: Diagnosis not present

## 2022-12-19 DIAGNOSIS — I110 Hypertensive heart disease with heart failure: Secondary | ICD-10-CM | POA: Diagnosis not present

## 2022-12-19 DIAGNOSIS — I5022 Chronic systolic (congestive) heart failure: Secondary | ICD-10-CM | POA: Diagnosis not present

## 2022-12-19 DIAGNOSIS — I2510 Atherosclerotic heart disease of native coronary artery without angina pectoris: Secondary | ICD-10-CM | POA: Diagnosis not present

## 2022-12-19 DIAGNOSIS — I429 Cardiomyopathy, unspecified: Secondary | ICD-10-CM | POA: Diagnosis not present

## 2022-12-19 DIAGNOSIS — I2699 Other pulmonary embolism without acute cor pulmonale: Secondary | ICD-10-CM | POA: Diagnosis not present

## 2022-12-19 DIAGNOSIS — J9622 Acute and chronic respiratory failure with hypercapnia: Secondary | ICD-10-CM | POA: Diagnosis not present

## 2022-12-19 DIAGNOSIS — I480 Paroxysmal atrial fibrillation: Secondary | ICD-10-CM | POA: Diagnosis not present

## 2022-12-22 DIAGNOSIS — I110 Hypertensive heart disease with heart failure: Secondary | ICD-10-CM | POA: Diagnosis not present

## 2022-12-22 DIAGNOSIS — I2510 Atherosclerotic heart disease of native coronary artery without angina pectoris: Secondary | ICD-10-CM | POA: Diagnosis not present

## 2022-12-22 DIAGNOSIS — J9622 Acute and chronic respiratory failure with hypercapnia: Secondary | ICD-10-CM | POA: Diagnosis not present

## 2022-12-22 DIAGNOSIS — J441 Chronic obstructive pulmonary disease with (acute) exacerbation: Secondary | ICD-10-CM | POA: Diagnosis not present

## 2022-12-22 DIAGNOSIS — I429 Cardiomyopathy, unspecified: Secondary | ICD-10-CM | POA: Diagnosis not present

## 2022-12-22 DIAGNOSIS — I480 Paroxysmal atrial fibrillation: Secondary | ICD-10-CM | POA: Diagnosis not present

## 2022-12-22 DIAGNOSIS — I5022 Chronic systolic (congestive) heart failure: Secondary | ICD-10-CM | POA: Diagnosis not present

## 2022-12-22 DIAGNOSIS — I2699 Other pulmonary embolism without acute cor pulmonale: Secondary | ICD-10-CM | POA: Diagnosis not present

## 2022-12-22 DIAGNOSIS — J9621 Acute and chronic respiratory failure with hypoxia: Secondary | ICD-10-CM | POA: Diagnosis not present

## 2022-12-24 DIAGNOSIS — I2510 Atherosclerotic heart disease of native coronary artery without angina pectoris: Secondary | ICD-10-CM | POA: Diagnosis not present

## 2022-12-24 DIAGNOSIS — J9622 Acute and chronic respiratory failure with hypercapnia: Secondary | ICD-10-CM | POA: Diagnosis not present

## 2022-12-24 DIAGNOSIS — I429 Cardiomyopathy, unspecified: Secondary | ICD-10-CM | POA: Diagnosis not present

## 2022-12-24 DIAGNOSIS — J441 Chronic obstructive pulmonary disease with (acute) exacerbation: Secondary | ICD-10-CM | POA: Diagnosis not present

## 2022-12-24 DIAGNOSIS — I2699 Other pulmonary embolism without acute cor pulmonale: Secondary | ICD-10-CM | POA: Diagnosis not present

## 2022-12-24 DIAGNOSIS — I480 Paroxysmal atrial fibrillation: Secondary | ICD-10-CM | POA: Diagnosis not present

## 2022-12-24 DIAGNOSIS — I5022 Chronic systolic (congestive) heart failure: Secondary | ICD-10-CM | POA: Diagnosis not present

## 2022-12-24 DIAGNOSIS — J9621 Acute and chronic respiratory failure with hypoxia: Secondary | ICD-10-CM | POA: Diagnosis not present

## 2022-12-24 DIAGNOSIS — I110 Hypertensive heart disease with heart failure: Secondary | ICD-10-CM | POA: Diagnosis not present

## 2022-12-25 ENCOUNTER — Telehealth: Payer: Self-pay | Admitting: Internal Medicine

## 2022-12-25 NOTE — Telephone Encounter (Signed)
Copied from CRM [id number]. Topic: General - Other >> Dec 25, 2022 10:25 AM Phill Myron wrote: Attn: Claudie Leach, CMA Please call Toniann Fail with [facility] regarding the bedside commode... Please advise

## 2022-12-25 NOTE — Telephone Encounter (Signed)
Toniann Fail from [place] called back in, requesting beside commode be sent to [facility], [address] [place], [place] [address]  fx is [phone]. Phone is [phone]

## 2022-12-26 DIAGNOSIS — I2510 Atherosclerotic heart disease of native coronary artery without angina pectoris: Secondary | ICD-10-CM | POA: Diagnosis not present

## 2022-12-26 DIAGNOSIS — I110 Hypertensive heart disease with heart failure: Secondary | ICD-10-CM | POA: Diagnosis not present

## 2022-12-26 DIAGNOSIS — I5022 Chronic systolic (congestive) heart failure: Secondary | ICD-10-CM | POA: Diagnosis not present

## 2022-12-26 DIAGNOSIS — I480 Paroxysmal atrial fibrillation: Secondary | ICD-10-CM | POA: Diagnosis not present

## 2022-12-26 DIAGNOSIS — J9621 Acute and chronic respiratory failure with hypoxia: Secondary | ICD-10-CM | POA: Diagnosis not present

## 2022-12-26 DIAGNOSIS — J9622 Acute and chronic respiratory failure with hypercapnia: Secondary | ICD-10-CM | POA: Diagnosis not present

## 2022-12-26 DIAGNOSIS — J441 Chronic obstructive pulmonary disease with (acute) exacerbation: Secondary | ICD-10-CM | POA: Diagnosis not present

## 2022-12-26 DIAGNOSIS — I2699 Other pulmonary embolism without acute cor pulmonale: Secondary | ICD-10-CM | POA: Diagnosis not present

## 2022-12-26 DIAGNOSIS — I429 Cardiomyopathy, unspecified: Secondary | ICD-10-CM | POA: Diagnosis not present

## 2022-12-26 NOTE — Telephone Encounter (Signed)
Order signed 12/17/2022.

## 2022-12-29 ENCOUNTER — Telehealth: Payer: Self-pay | Admitting: Family

## 2022-12-29 NOTE — Telephone Encounter (Signed)
Copied from CRM [id number]. Topic: General - Other >> Dec 29, 2022  9:28 AM Marlow Baars wrote: Reason for CRM: Toniann Fail with [facility] called to say that [facility] doe snot take [employer] and would like for the provider to send the script for bedside commode to a pharmacy that does participate with his insurance. Please assist patient further and give Toniann Fail a call back to let her know where the script is being sent to.

## 2023-01-01 DIAGNOSIS — I2699 Other pulmonary embolism without acute cor pulmonale: Secondary | ICD-10-CM | POA: Diagnosis not present

## 2023-01-01 DIAGNOSIS — I110 Hypertensive heart disease with heart failure: Secondary | ICD-10-CM | POA: Diagnosis not present

## 2023-01-01 DIAGNOSIS — J9621 Acute and chronic respiratory failure with hypoxia: Secondary | ICD-10-CM | POA: Diagnosis not present

## 2023-01-01 DIAGNOSIS — I429 Cardiomyopathy, unspecified: Secondary | ICD-10-CM | POA: Diagnosis not present

## 2023-01-01 DIAGNOSIS — I2510 Atherosclerotic heart disease of native coronary artery without angina pectoris: Secondary | ICD-10-CM | POA: Diagnosis not present

## 2023-01-01 DIAGNOSIS — J441 Chronic obstructive pulmonary disease with (acute) exacerbation: Secondary | ICD-10-CM | POA: Diagnosis not present

## 2023-01-01 DIAGNOSIS — I480 Paroxysmal atrial fibrillation: Secondary | ICD-10-CM | POA: Diagnosis not present

## 2023-01-01 DIAGNOSIS — I5022 Chronic systolic (congestive) heart failure: Secondary | ICD-10-CM | POA: Diagnosis not present

## 2023-01-01 DIAGNOSIS — J9622 Acute and chronic respiratory failure with hypercapnia: Secondary | ICD-10-CM | POA: Diagnosis not present

## 2023-01-02 ENCOUNTER — Ambulatory Visit (INDEPENDENT_AMBULATORY_CARE_PROVIDER_SITE_OTHER): Payer: Medicare HMO | Admitting: Nurse Practitioner

## 2023-01-02 ENCOUNTER — Encounter: Payer: Self-pay | Admitting: Nurse Practitioner

## 2023-01-02 VITALS — BP 110/80 | HR 44 | Ht 71.0 in

## 2023-01-02 DIAGNOSIS — K5900 Constipation, unspecified: Secondary | ICD-10-CM | POA: Diagnosis not present

## 2023-01-02 DIAGNOSIS — R1310 Dysphagia, unspecified: Secondary | ICD-10-CM

## 2023-01-02 DIAGNOSIS — I2510 Atherosclerotic heart disease of native coronary artery without angina pectoris: Secondary | ICD-10-CM | POA: Diagnosis not present

## 2023-01-02 DIAGNOSIS — J9611 Chronic respiratory failure with hypoxia: Secondary | ICD-10-CM

## 2023-01-02 DIAGNOSIS — R12 Heartburn: Secondary | ICD-10-CM

## 2023-01-02 DIAGNOSIS — I5022 Chronic systolic (congestive) heart failure: Secondary | ICD-10-CM | POA: Diagnosis not present

## 2023-01-02 DIAGNOSIS — I429 Cardiomyopathy, unspecified: Secondary | ICD-10-CM | POA: Diagnosis not present

## 2023-01-02 DIAGNOSIS — I110 Hypertensive heart disease with heart failure: Secondary | ICD-10-CM | POA: Diagnosis not present

## 2023-01-02 DIAGNOSIS — J441 Chronic obstructive pulmonary disease with (acute) exacerbation: Secondary | ICD-10-CM | POA: Diagnosis not present

## 2023-01-02 DIAGNOSIS — Z1211 Encounter for screening for malignant neoplasm of colon: Secondary | ICD-10-CM | POA: Diagnosis not present

## 2023-01-02 DIAGNOSIS — J9621 Acute and chronic respiratory failure with hypoxia: Secondary | ICD-10-CM | POA: Diagnosis not present

## 2023-01-02 DIAGNOSIS — I480 Paroxysmal atrial fibrillation: Secondary | ICD-10-CM | POA: Diagnosis not present

## 2023-01-02 DIAGNOSIS — J9622 Acute and chronic respiratory failure with hypercapnia: Secondary | ICD-10-CM | POA: Diagnosis not present

## 2023-01-02 DIAGNOSIS — I2699 Other pulmonary embolism without acute cor pulmonale: Secondary | ICD-10-CM | POA: Diagnosis not present

## 2023-01-02 NOTE — Progress Notes (Unsigned)
Brief Narrative 72 y.o. yo male , new to the practice, referred by PCP for colon cancer screening. He has a past medical history not limited to HTN, Afib, chronic systolic heart failure, COPD on home 02, PE, CAD, DM    ASSESSMENT    72 yo male with multiple medical co-morbidities presenting for colon cancer screening .  Afib on chronic Eliquis.    See PMH for any additional medical history   PLAN     Dulcolax 5 mg, take 2 today then one daily Continue Miralax daily 02 sat is 72-75% on portable 02 Barium swallow with tablet    HPI   Chief complaint :   Mirlax doesn't work for him. He hasn't tried anything else.    Intermittent ankle edema. Has chronic SHOB with minimal exertion.    Drinks 2-3 glasses of water a day. Tells me he is on a fluid restriction for his heart.    Procedure risk assessment:  No history of CHF.  No supplemental 02 use at home.  Not a known difficult airway Anticoagulant:    GI History / Studies   **May not be a complete list of studies     Labs      Latest Ref Rng & Units 12/07/2022    3:59 AM 12/05/2022    6:26 PM 12/05/2022    6:17 PM  CBC  WBC 4.0 - 10.5 K/uL 21.1   8.7   Hemoglobin 13.0 - 17.0 g/dL 86.5  78.4  69.6   Hematocrit 39.0 - 52.0 % 43.0  50.0  48.1   Platelets 150 - 400 K/uL 177   204     Lab Results  Component Value Date   LIPASE 23 11/18/2022      Latest Ref Rng & Units 12/07/2022    3:59 AM 12/05/2022    6:26 PM 12/05/2022    6:17 PM  CMP  Glucose 70 - 99 mg/dL 295   284   BUN 8 - 23 mg/dL 20   17   Creatinine 1.32 - 1.24 mg/dL 4.40   1.02   Sodium 725 - 145 mmol/L 136  133  136   Potassium 3.5 - 5.1 mmol/L 4.3  6.1  4.5   Chloride 98 - 111 mmol/L 93   93   CO2 22 - 32 mmol/L 32   30   Calcium 8.9 - 10.3 mg/dL 8.9   9.3         DG Chest Portable 1 View CLINICAL DATA:  Shortness of breath  EXAM: PORTABLE CHEST 1 VIEW  COMPARISON:  Radiograph  11/18/2022  FINDINGS: Hyperinflation and chronic bronchitic changes. No focal consolidation, pleural effusion, or pneumothorax. Stable cardiomediastinal silhouette. Aortic atherosclerotic calcification. Remote left rib fractures.  IMPRESSION: No acute cardiopulmonary process.  Emphysema.  Electronically Signed   By: Minerva Fester M.D.   On: 12/05/2022 20:08    Past Medical History:  Diagnosis Date   AF (atrial fibrillation) (HCC)    Alcohol abuse    Angina pectoris    CAD (coronary artery disease) 12/15/2004   COPD (chronic obstructive pulmonary disease) (HCC)    Coronary artery disease    Heavy cigarette smoker    HTN (hypertension)    Idiopathic cardiomyopathy (HCC) 07/16/2009   Non compliance with medical treatment    Seizures [facility])    Past Surgical History:  Procedure Laterality Date   CARDIAC CATHETERIZATION  2006   CARDIAC CATHETERIZATION  12/2004   Left  CARDIOVERSION  01/21/2011   Procedure: CARDIOVERSION;  Surgeon: Darden Palmer., MD;  Location: [facility] OR;  Service: Cardiovascular;  Laterality: N/A;   DOPPLER ECHOCARDIOGRAPHY  02/2011   ELECTROPHYSIOLOGY STUDY N/A 12/16/2011   Procedure: ELECTROPHYSIOLOGY STUDY;  Surgeon: Marinus Maw, MD;  Location: [facility] CATH LAB;  Service: Cardiovascular;  Laterality: N/A;   EP IMPLANTABLE DEVICE N/A 08/24/2015   Procedure: Loop Recorder Removal;  Surgeon: Marinus Maw, MD;  Location: [facility] INVASIVE CV LAB;  Service: Cardiovascular;  Laterality: N/A;   fx collar bone     NO PAST SURGERIES     Family History  Problem Relation Age of Onset   Suicidality Sister    CVA Mother    Stroke Mother    Heart disease Father    Coronary artery disease Father    COPD Father    Social History   Tobacco Use   Smoking status: Every Day    Current packs/day: 1.00    Average packs/day: 1 pack/day for 58.0 years (58.0 ttl pk-yrs)    Types: Cigarettes   Smokeless tobacco: Never   Tobacco comments:    pt states he is down to 1ppd  09/20/2020  Vaping Use   Vaping status: Never Used  Substance Use Topics   Alcohol use: Not Currently    Comment: occ   Drug use: Yes    Types: Marijuana   Current Outpatient Medications  Medication Sig Dispense Refill   albuterol (PROVENTIL) (2.5 MG/3ML) 0.083% nebulizer solution Take 3 mLs (2.5 mg total) by nebulization every 4 (four) hours as needed for wheezing or shortness of breath. 1080 mL 12   aspirin EC 81 MG tablet Take 1 tablet (81 mg total) by mouth daily. Swallow whole. 30 tablet 12   ELIQUIS 5 MG TABS tablet Take 1 tablet (5 mg total) by mouth 2 (two) times daily. 60 tablet 2   fluticasone furoate-vilanterol (BREO ELLIPTA) 100-25 MCG/ACT AEPB Inhale 1 puff into the lungs daily. 60 each 2   folic acid (FOLVITE) 1 MG tablet Take 1 tablet (1 mg total) by mouth daily. 30 tablet 0   gabapentin (NEURONTIN) 400 MG capsule Take 1 capsule (400 mg total) by mouth 3 (three) times daily. 90 capsule 2   metoprolol succinate (TOPROL-XL) 50 MG 24 hr tablet Take 1 tablet (50 mg total) by mouth daily. Take with or immediately following a meal. 90 tablet 3   OXYGEN Inhale 3 L into the lungs continuous.      pantoprazole (PROTONIX) 40 MG tablet Take 1 tablet (40 mg total) by mouth daily as needed (acid reflux). 30 tablet 2   polyethylene glycol (MIRALAX / GLYCOLAX) 17 g packet Take 17 g by mouth daily. (Patient taking differently: Take 17 g by mouth daily as needed for mild constipation.) 30 each 0   predniSONE (DELTASONE) 10 MG tablet Take 40 mg daily for 1 day, 30 mg daily for 1 day, 20 mg daily for 1 days,10 mg daily for 1 day, then stop 10 tablet 0   senna (SENOKOT) 8.6 MG TABS tablet Take 1-2 tablets (8.6-17.2 mg total) by mouth daily. (Patient taking differently: Take 1-2 tablets by mouth daily as needed for moderate constipation.) 30 tablet 0   SPIRIVA RESPIMAT 2.5 MCG/ACT AERS Inhale 2 puffs into the lungs daily. 4 g 2   traZODone (DESYREL) 50 MG tablet Take 1 tablet (50 mg total) by mouth  at bedtime as needed for sleep. 30 tablet 0   VENTOLIN HFA 108 (  90 Base) MCG/ACT inhaler INHALE 1-2 PUFFS INTO THE LUNGS EVERY 6 HOURS AS NEEDED FOR WHEEZING OR SHORTNESS OF BREATH 18 g 2   No current facility-administered medications for this visit.   No Known Allergies   Review of Systems: Positive for ***.  All other systems reviewed and negative except where noted in HPI.   Wt Readings from Last 3 Encounters:  11/17/22 190 lb 0.6 oz (86.2 kg)  08/15/22 190 lb (86.2 kg)  03/26/22 183 lb 13.8 oz (83.4 kg)    Physical Exam:  BP 110/80 (BP Location: Left Arm, Patient Position: Sitting, Cuff Size: Normal)   Pulse (!) 44   Ht 5\' 11"  (1.803 m)   SpO2 (!) 75%   BMI 26.50 kg/m  Constitutional:  Pleasant, generally well appearing ***male in no acute distress. Psychiatric:  Normal mood and affect. Behavior is normal. EENT: Pupils normal.  Conjunctivae are normal. No scleral icterus. Neck supple.  Cardiovascular: Normal rate, regular rhythm.  Pulmonary/chest: Effort normal and breath sounds normal. No wheezing, rales or rhonchi. Abdominal: Soft, nondistended, nontender. Bowel sounds active throughout. There are no masses palpable. No hepatomegaly. Neurological: Alert and oriented to person place and time. Extremities: *** No edema   Willette Cluster, NP  01/02/2023, 3:14 PM  Cc:  Referring Provider Rema Fendt, NP

## 2023-01-02 NOTE — Patient Instructions (Signed)
You have been scheduled for a Barium Esophogram at [facility] Radiology (1st floor of the hospital) on 01/06/23 at 11:00 am. Please arrive 30 minutes prior to your appointment for registration. Make certain not to have anything to eat or drink 3 hours prior to your test. If you need to reschedule for any reason, please contact radiology at [phone] to do so. __________________________________________________________________ A barium swallow is an examination that concentrates on views of the esophagus. This tends to be a double contrast exam (barium and two liquids which, when combined, create a gas to distend the wall of the oesophagus) or single contrast (non-ionic iodine based). The study is usually tailored to your symptoms so a good history is essential. Attention is paid during the study to the form, structure and configuration of the esophagus, looking for functional disorders (such as aspiration, dysphagia, achalasia, motility and reflux) EXAMINATION You may be asked to change into a gown, depending on the type of swallow being performed. A radiologist and radiographer will perform the procedure. The radiologist will advise you of the type of contrast selected for your procedure and direct you during the exam. You will be asked to stand, sit or lie in several different positions and to hold a small amount of fluid in your mouth before being asked to swallow while the imaging is performed .In some instances you may be asked to swallow barium coated marshmallows to assess the motility of a solid food bolus. The exam can be recorded as a digital or video fluoroscopy procedure. POST PROCEDURE It will take 1-2 days for the barium to pass through your system. To facilitate this, it is important, unless otherwise directed, to increase your fluids for the next 24-48hrs and to resume your normal diet.  This test typically takes about 30 minutes to  perform. __________________________________________________________________________________   Dulcolax tablet 5 mg take 2 today then take 1 daily  Continue Miralax daily.  Contact your primary care provider regarding cough and green sputum. Go to the ED if you have shortness of breath over the weekend.  You have been scheduled for a follow up with Dr.Nandigam on 03/19/2023 at 9:30. Please arrive 15-20 minutes early.  Due to recent changes in healthcare laws, you may see the results of your imaging and laboratory studies on MyChart before your provider has had a chance to review them.  We understand that in some cases there may be results that are confusing or concerning to you. Not all laboratory results come back in the same time frame and the provider may be waiting for multiple results in order to interpret others.  Please give [place] 48 hours in order for your provider to thoroughly review all the results before contacting the office for clarification of your results.   Thank you for trusting me with your gastrointestinal care!

## 2023-01-02 NOTE — Telephone Encounter (Signed)
Faxed to [facility] 01/01/2023

## 2023-01-03 ENCOUNTER — Encounter: Payer: Self-pay | Admitting: Nurse Practitioner

## 2023-01-03 DIAGNOSIS — Z9181 History of falling: Secondary | ICD-10-CM | POA: Diagnosis not present

## 2023-01-03 DIAGNOSIS — I509 Heart failure, unspecified: Secondary | ICD-10-CM | POA: Diagnosis not present

## 2023-01-03 DIAGNOSIS — I2510 Atherosclerotic heart disease of native coronary artery without angina pectoris: Secondary | ICD-10-CM | POA: Diagnosis not present

## 2023-01-03 DIAGNOSIS — J4489 Other specified chronic obstructive pulmonary disease: Secondary | ICD-10-CM | POA: Diagnosis not present

## 2023-01-03 DIAGNOSIS — Z9981 Dependence on supplemental oxygen: Secondary | ICD-10-CM | POA: Diagnosis not present

## 2023-01-05 ENCOUNTER — Ambulatory Visit: Payer: Medicare HMO | Admitting: Family

## 2023-01-05 DIAGNOSIS — I480 Paroxysmal atrial fibrillation: Secondary | ICD-10-CM | POA: Diagnosis not present

## 2023-01-05 DIAGNOSIS — I110 Hypertensive heart disease with heart failure: Secondary | ICD-10-CM | POA: Diagnosis not present

## 2023-01-05 DIAGNOSIS — J441 Chronic obstructive pulmonary disease with (acute) exacerbation: Secondary | ICD-10-CM | POA: Diagnosis not present

## 2023-01-05 DIAGNOSIS — I2699 Other pulmonary embolism without acute cor pulmonale: Secondary | ICD-10-CM | POA: Diagnosis not present

## 2023-01-05 DIAGNOSIS — I429 Cardiomyopathy, unspecified: Secondary | ICD-10-CM | POA: Diagnosis not present

## 2023-01-05 DIAGNOSIS — I2510 Atherosclerotic heart disease of native coronary artery without angina pectoris: Secondary | ICD-10-CM | POA: Diagnosis not present

## 2023-01-05 DIAGNOSIS — I5022 Chronic systolic (congestive) heart failure: Secondary | ICD-10-CM | POA: Diagnosis not present

## 2023-01-05 DIAGNOSIS — J9621 Acute and chronic respiratory failure with hypoxia: Secondary | ICD-10-CM | POA: Diagnosis not present

## 2023-01-05 DIAGNOSIS — J9622 Acute and chronic respiratory failure with hypercapnia: Secondary | ICD-10-CM | POA: Diagnosis not present

## 2023-01-06 ENCOUNTER — Ambulatory Visit (HOSPITAL_COMMUNITY): Admission: RE | Admit: 2023-01-06 | Payer: Medicare HMO | Source: Ambulatory Visit

## 2023-01-06 DIAGNOSIS — K219 Gastro-esophageal reflux disease without esophagitis: Secondary | ICD-10-CM | POA: Diagnosis not present

## 2023-01-06 DIAGNOSIS — Z136 Encounter for screening for cardiovascular disorders: Secondary | ICD-10-CM | POA: Diagnosis not present

## 2023-01-06 DIAGNOSIS — I10 Essential (primary) hypertension: Secondary | ICD-10-CM | POA: Diagnosis not present

## 2023-01-06 DIAGNOSIS — E785 Hyperlipidemia, unspecified: Secondary | ICD-10-CM | POA: Diagnosis not present

## 2023-01-06 DIAGNOSIS — Z7901 Long term (current) use of anticoagulants: Secondary | ICD-10-CM | POA: Diagnosis not present

## 2023-01-06 DIAGNOSIS — J449 Chronic obstructive pulmonary disease, unspecified: Secondary | ICD-10-CM | POA: Diagnosis not present

## 2023-01-06 DIAGNOSIS — K5900 Constipation, unspecified: Secondary | ICD-10-CM | POA: Diagnosis not present

## 2023-01-06 DIAGNOSIS — R32 Unspecified urinary incontinence: Secondary | ICD-10-CM | POA: Diagnosis not present

## 2023-01-06 DIAGNOSIS — Z0001 Encounter for general adult medical examination with abnormal findings: Secondary | ICD-10-CM | POA: Diagnosis not present

## 2023-01-06 DIAGNOSIS — G4700 Insomnia, unspecified: Secondary | ICD-10-CM | POA: Diagnosis not present

## 2023-01-07 DIAGNOSIS — J9622 Acute and chronic respiratory failure with hypercapnia: Secondary | ICD-10-CM | POA: Diagnosis not present

## 2023-01-07 DIAGNOSIS — J441 Chronic obstructive pulmonary disease with (acute) exacerbation: Secondary | ICD-10-CM | POA: Diagnosis not present

## 2023-01-07 DIAGNOSIS — I5022 Chronic systolic (congestive) heart failure: Secondary | ICD-10-CM | POA: Diagnosis not present

## 2023-01-07 DIAGNOSIS — I110 Hypertensive heart disease with heart failure: Secondary | ICD-10-CM | POA: Diagnosis not present

## 2023-01-07 DIAGNOSIS — I2510 Atherosclerotic heart disease of native coronary artery without angina pectoris: Secondary | ICD-10-CM | POA: Diagnosis not present

## 2023-01-07 DIAGNOSIS — R059 Cough, unspecified: Secondary | ICD-10-CM | POA: Diagnosis not present

## 2023-01-07 DIAGNOSIS — I429 Cardiomyopathy, unspecified: Secondary | ICD-10-CM | POA: Diagnosis not present

## 2023-01-07 DIAGNOSIS — I2699 Other pulmonary embolism without acute cor pulmonale: Secondary | ICD-10-CM | POA: Diagnosis not present

## 2023-01-07 DIAGNOSIS — R093 Abnormal sputum: Secondary | ICD-10-CM | POA: Diagnosis not present

## 2023-01-07 DIAGNOSIS — I480 Paroxysmal atrial fibrillation: Secondary | ICD-10-CM | POA: Diagnosis not present

## 2023-01-07 DIAGNOSIS — J9621 Acute and chronic respiratory failure with hypoxia: Secondary | ICD-10-CM | POA: Diagnosis not present

## 2023-01-12 NOTE — Telephone Encounter (Signed)
Johnny Rodriguez would like to know if the request for the commode was sent to a pharmacy?  She questions what is [facility]? Please advise. Patient is in need and has been waiting since November 6th, 2024.

## 2023-01-14 DIAGNOSIS — I110 Hypertensive heart disease with heart failure: Secondary | ICD-10-CM | POA: Diagnosis not present

## 2023-01-14 DIAGNOSIS — I480 Paroxysmal atrial fibrillation: Secondary | ICD-10-CM | POA: Diagnosis not present

## 2023-01-14 DIAGNOSIS — J441 Chronic obstructive pulmonary disease with (acute) exacerbation: Secondary | ICD-10-CM | POA: Diagnosis not present

## 2023-01-14 DIAGNOSIS — J9621 Acute and chronic respiratory failure with hypoxia: Secondary | ICD-10-CM | POA: Diagnosis not present

## 2023-01-14 DIAGNOSIS — J9622 Acute and chronic respiratory failure with hypercapnia: Secondary | ICD-10-CM | POA: Diagnosis not present

## 2023-01-14 DIAGNOSIS — I429 Cardiomyopathy, unspecified: Secondary | ICD-10-CM | POA: Diagnosis not present

## 2023-01-14 DIAGNOSIS — I2510 Atherosclerotic heart disease of native coronary artery without angina pectoris: Secondary | ICD-10-CM | POA: Diagnosis not present

## 2023-01-14 DIAGNOSIS — I5022 Chronic systolic (congestive) heart failure: Secondary | ICD-10-CM | POA: Diagnosis not present

## 2023-01-14 DIAGNOSIS — I2699 Other pulmonary embolism without acute cor pulmonale: Secondary | ICD-10-CM | POA: Diagnosis not present

## 2023-01-14 NOTE — Telephone Encounter (Signed)
I spoke to Toniann Fail today 01/14/2023 informed her that I have faxed this to patient preferred pharmacy as well as [facility]. I explained to her what [facility] is. She stated his insurance isnt accepted, I asked her if she could call the insurance and see who will take it so I can fax it to them.

## 2023-01-14 NOTE — Telephone Encounter (Signed)
Spoke to Elsa informed I have sent this to his preferred pharmacy as well as [facility].

## 2023-01-14 NOTE — Telephone Encounter (Signed)
Toniann Fail calling back today to advise the pharmacy the commode was sent to does not accept the pt's insurance.  She is requesting a callback asap as this has been going on too long.  Thanks Cb [phone]

## 2023-01-15 ENCOUNTER — Telehealth: Payer: Self-pay | Admitting: Family

## 2023-01-15 DIAGNOSIS — I2510 Atherosclerotic heart disease of native coronary artery without angina pectoris: Secondary | ICD-10-CM | POA: Diagnosis not present

## 2023-01-15 DIAGNOSIS — I480 Paroxysmal atrial fibrillation: Secondary | ICD-10-CM | POA: Diagnosis not present

## 2023-01-15 DIAGNOSIS — J9622 Acute and chronic respiratory failure with hypercapnia: Secondary | ICD-10-CM | POA: Diagnosis not present

## 2023-01-15 DIAGNOSIS — J9621 Acute and chronic respiratory failure with hypoxia: Secondary | ICD-10-CM | POA: Diagnosis not present

## 2023-01-15 DIAGNOSIS — I110 Hypertensive heart disease with heart failure: Secondary | ICD-10-CM | POA: Diagnosis not present

## 2023-01-15 DIAGNOSIS — I2699 Other pulmonary embolism without acute cor pulmonale: Secondary | ICD-10-CM | POA: Diagnosis not present

## 2023-01-15 DIAGNOSIS — J441 Chronic obstructive pulmonary disease with (acute) exacerbation: Secondary | ICD-10-CM | POA: Diagnosis not present

## 2023-01-15 DIAGNOSIS — I429 Cardiomyopathy, unspecified: Secondary | ICD-10-CM | POA: Diagnosis not present

## 2023-01-15 DIAGNOSIS — I5022 Chronic systolic (congestive) heart failure: Secondary | ICD-10-CM | POA: Diagnosis not present

## 2023-01-15 NOTE — Telephone Encounter (Signed)
Toniann Fail called to have the bedside commode orders sent to [facility]. Please f/u with Toniann Fail

## 2023-01-16 ENCOUNTER — Encounter (INDEPENDENT_AMBULATORY_CARE_PROVIDER_SITE_OTHER): Payer: Medicare HMO | Admitting: Family

## 2023-01-16 ENCOUNTER — Other Ambulatory Visit: Payer: Self-pay | Admitting: Family

## 2023-01-16 DIAGNOSIS — R350 Frequency of micturition: Secondary | ICD-10-CM

## 2023-01-16 NOTE — Progress Notes (Signed)
Erroneous encounter-disregard

## 2023-01-16 NOTE — Telephone Encounter (Signed)
Complete

## 2023-01-20 DIAGNOSIS — I110 Hypertensive heart disease with heart failure: Secondary | ICD-10-CM | POA: Diagnosis not present

## 2023-01-20 DIAGNOSIS — I2510 Atherosclerotic heart disease of native coronary artery without angina pectoris: Secondary | ICD-10-CM | POA: Diagnosis not present

## 2023-01-20 DIAGNOSIS — I5022 Chronic systolic (congestive) heart failure: Secondary | ICD-10-CM | POA: Diagnosis not present

## 2023-01-20 DIAGNOSIS — J9622 Acute and chronic respiratory failure with hypercapnia: Secondary | ICD-10-CM | POA: Diagnosis not present

## 2023-01-20 DIAGNOSIS — I2699 Other pulmonary embolism without acute cor pulmonale: Secondary | ICD-10-CM | POA: Diagnosis not present

## 2023-01-20 DIAGNOSIS — I480 Paroxysmal atrial fibrillation: Secondary | ICD-10-CM | POA: Diagnosis not present

## 2023-01-20 DIAGNOSIS — J441 Chronic obstructive pulmonary disease with (acute) exacerbation: Secondary | ICD-10-CM | POA: Diagnosis not present

## 2023-01-20 DIAGNOSIS — J9621 Acute and chronic respiratory failure with hypoxia: Secondary | ICD-10-CM | POA: Diagnosis not present

## 2023-01-20 DIAGNOSIS — I429 Cardiomyopathy, unspecified: Secondary | ICD-10-CM | POA: Diagnosis not present

## 2023-01-21 DIAGNOSIS — I5022 Chronic systolic (congestive) heart failure: Secondary | ICD-10-CM | POA: Diagnosis not present

## 2023-01-21 DIAGNOSIS — I2510 Atherosclerotic heart disease of native coronary artery without angina pectoris: Secondary | ICD-10-CM | POA: Diagnosis not present

## 2023-01-21 DIAGNOSIS — I110 Hypertensive heart disease with heart failure: Secondary | ICD-10-CM | POA: Diagnosis not present

## 2023-01-21 DIAGNOSIS — I429 Cardiomyopathy, unspecified: Secondary | ICD-10-CM | POA: Diagnosis not present

## 2023-01-21 DIAGNOSIS — J9621 Acute and chronic respiratory failure with hypoxia: Secondary | ICD-10-CM | POA: Diagnosis not present

## 2023-01-21 DIAGNOSIS — J9622 Acute and chronic respiratory failure with hypercapnia: Secondary | ICD-10-CM | POA: Diagnosis not present

## 2023-01-21 DIAGNOSIS — I2699 Other pulmonary embolism without acute cor pulmonale: Secondary | ICD-10-CM | POA: Diagnosis not present

## 2023-01-21 DIAGNOSIS — J441 Chronic obstructive pulmonary disease with (acute) exacerbation: Secondary | ICD-10-CM | POA: Diagnosis not present

## 2023-01-21 DIAGNOSIS — I480 Paroxysmal atrial fibrillation: Secondary | ICD-10-CM | POA: Diagnosis not present

## 2023-01-27 DIAGNOSIS — I5022 Chronic systolic (congestive) heart failure: Secondary | ICD-10-CM | POA: Diagnosis not present

## 2023-01-27 DIAGNOSIS — J9621 Acute and chronic respiratory failure with hypoxia: Secondary | ICD-10-CM | POA: Diagnosis not present

## 2023-01-27 DIAGNOSIS — I2510 Atherosclerotic heart disease of native coronary artery without angina pectoris: Secondary | ICD-10-CM | POA: Diagnosis not present

## 2023-01-27 DIAGNOSIS — R32 Unspecified urinary incontinence: Secondary | ICD-10-CM | POA: Diagnosis not present

## 2023-01-27 DIAGNOSIS — J9622 Acute and chronic respiratory failure with hypercapnia: Secondary | ICD-10-CM | POA: Diagnosis not present

## 2023-01-27 DIAGNOSIS — I110 Hypertensive heart disease with heart failure: Secondary | ICD-10-CM | POA: Diagnosis not present

## 2023-01-27 DIAGNOSIS — I2699 Other pulmonary embolism without acute cor pulmonale: Secondary | ICD-10-CM | POA: Diagnosis not present

## 2023-01-27 DIAGNOSIS — I429 Cardiomyopathy, unspecified: Secondary | ICD-10-CM | POA: Diagnosis not present

## 2023-01-27 DIAGNOSIS — J441 Chronic obstructive pulmonary disease with (acute) exacerbation: Secondary | ICD-10-CM | POA: Diagnosis not present

## 2023-01-27 DIAGNOSIS — I480 Paroxysmal atrial fibrillation: Secondary | ICD-10-CM | POA: Diagnosis not present

## 2023-01-30 DIAGNOSIS — J449 Chronic obstructive pulmonary disease, unspecified: Secondary | ICD-10-CM | POA: Diagnosis not present

## 2023-01-30 DIAGNOSIS — I4891 Unspecified atrial fibrillation: Secondary | ICD-10-CM | POA: Diagnosis not present

## 2023-01-30 DIAGNOSIS — I10 Essential (primary) hypertension: Secondary | ICD-10-CM | POA: Diagnosis not present

## 2023-01-30 DIAGNOSIS — I509 Heart failure, unspecified: Secondary | ICD-10-CM | POA: Diagnosis not present

## 2023-01-30 DIAGNOSIS — J9690 Respiratory failure, unspecified, unspecified whether with hypoxia or hypercapnia: Secondary | ICD-10-CM | POA: Diagnosis not present

## 2023-01-30 DIAGNOSIS — Z0001 Encounter for general adult medical examination with abnormal findings: Secondary | ICD-10-CM | POA: Diagnosis not present

## 2023-01-30 DIAGNOSIS — E785 Hyperlipidemia, unspecified: Secondary | ICD-10-CM | POA: Diagnosis not present

## 2023-02-02 DIAGNOSIS — I509 Heart failure, unspecified: Secondary | ICD-10-CM | POA: Diagnosis not present

## 2023-02-02 DIAGNOSIS — I2510 Atherosclerotic heart disease of native coronary artery without angina pectoris: Secondary | ICD-10-CM | POA: Diagnosis not present

## 2023-02-02 DIAGNOSIS — J4489 Other specified chronic obstructive pulmonary disease: Secondary | ICD-10-CM | POA: Diagnosis not present

## 2023-02-02 DIAGNOSIS — Z9181 History of falling: Secondary | ICD-10-CM | POA: Diagnosis not present

## 2023-02-02 DIAGNOSIS — Z9981 Dependence on supplemental oxygen: Secondary | ICD-10-CM | POA: Diagnosis not present

## 2023-02-16 ENCOUNTER — Encounter: Payer: Self-pay | Admitting: Family

## 2023-02-16 NOTE — Progress Notes (Signed)
 Erroneous encounter-disregard

## 2023-03-06 ENCOUNTER — Telehealth: Payer: Self-pay | Admitting: Nurse Practitioner

## 2023-03-06 NOTE — Telephone Encounter (Signed)
Attempted to contact Johnny Rodriguez & left a voicemail to return call.

## 2023-03-06 NOTE — Telephone Encounter (Signed)
Sh[facility] called [facility] to reschedule barium swallow. [phone]

## 2023-03-09 ENCOUNTER — Encounter (HOSPITAL_COMMUNITY): Payer: Self-pay | Admitting: Nurse Practitioner

## 2023-03-19 ENCOUNTER — Ambulatory Visit (INDEPENDENT_AMBULATORY_CARE_PROVIDER_SITE_OTHER): Payer: Medicare HMO | Admitting: Gastroenterology

## 2023-03-19 ENCOUNTER — Telehealth: Payer: Self-pay

## 2023-03-19 ENCOUNTER — Encounter: Payer: Self-pay | Admitting: Gastroenterology

## 2023-03-19 VITALS — BP 118/70 | HR 81 | Ht 71.0 in | Wt 146.0 lb

## 2023-03-19 DIAGNOSIS — R1013 Epigastric pain: Secondary | ICD-10-CM

## 2023-03-19 DIAGNOSIS — Z9981 Dependence on supplemental oxygen: Secondary | ICD-10-CM | POA: Diagnosis not present

## 2023-03-19 DIAGNOSIS — K5909 Other constipation: Secondary | ICD-10-CM

## 2023-03-19 DIAGNOSIS — R1310 Dysphagia, unspecified: Secondary | ICD-10-CM | POA: Diagnosis not present

## 2023-03-19 DIAGNOSIS — K5904 Chronic idiopathic constipation: Secondary | ICD-10-CM

## 2023-03-19 DIAGNOSIS — R1319 Other dysphagia: Secondary | ICD-10-CM

## 2023-03-19 DIAGNOSIS — G8929 Other chronic pain: Secondary | ICD-10-CM

## 2023-03-19 MED ORDER — LINACLOTIDE 145 MCG PO CAPS: 145.0000 ug | ORAL_CAPSULE | Freq: Every day | ORAL | 3 refills | Status: DC

## 2023-03-19 NOTE — Patient Instructions (Addendum)
 VISIT SUMMARY:  During today's visit, we discussed your ongoing issues with swallowing, breathing, and constipation. We also reviewed your general health maintenance and planned the next steps to address your concerns.  YOUR PLAN:  -DYSPHAGIA: Dysphagia means difficulty swallowing. We discussed scheduling an upper endoscopy at [facility] to investigate and potentially treat the cause of your swallowing difficulties. This procedure will be done under general anesthesia, and we will have specialists available in case of complications. In the meantime, avoid tough meats and opt for softer foods like mac and cheese, chili beans, and well-chewed spaghetti and meatballs.  -CONSTIPATION: Constipation is when you have infrequent or difficult bowel movements. Since MiraLAX  has not been effective for you, we will start you on Linzess  145 mcg daily to help improve your bowel movements. Please let us  know if the dose is too strong or not effective enough so we can adjust it.  Linzess  works best when taken once a day every day, on an empty stomach, at least 30 minutes before your first meal of the day.  When Linzess  is taken daily as directed:  *Constipation relief is typically felt in about a week *IBS-C patients may begin to experience relief from belly pain and overall abdominal symptoms (pain, discomfort, and bloating) in about 1 week,   with symptoms typically improving over 12 weeks.  Diarrhea may occur in the first 2 weeks -keep taking it.  The diarrhea should go away and you should start having normal, complete, full bowel movements. It may be helpful to start treatment when you can be near the comfort of your own bathroom, such as a weekend.    -GENERAL HEALTH MAINTENANCE: We are deferring your colonoscopy for now due to your current swallowing issues and high cardiac risk. We will prioritize resolving your swallowing difficulties first before considering this  procedure.  INSTRUCTIONS:  We will call you with the date and time for your upper endoscopy. Please ensure you have an escort for the procedure. We will also provide you with pre-procedure instructions before you leave the clinic.  Thank you for entrusting me with your care and choosing [facility].  Dr Shila

## 2023-03-19 NOTE — Telephone Encounter (Signed)
 [facility] Pre-operative Risk Assessment     Request for surgical clearance:     Endoscopy Procedure  What type of surgery is being performed?     EGD  When is this surgery scheduled?     04-13-23  What type of clearance is required ?   Pharmacy/Medical Clearance  Are there any medications that need to be held prior to surgery and how long? Eliquis  x 2 days  Practice name and name of physician performing surgery?      [facility]  What is your office phone and fax number?      Phone- [phone]  Fax- [phone]  Anesthesia type (None, local, MAC, general) ?       MAC   Please route your response to Nat SAUNDERS, CMA

## 2023-03-19 NOTE — Progress Notes (Signed)
 Johnny Rodriguez    [id number]    05/08/1950  Primary Care Physician:Stephens, Greig PARAS, NP  Referring Physician: Lorren Greig PARAS, NP [address] [place],  [place] [address]   Chief complaint:  Dysphagia, odynophagia  Discussed the use of AI scribe software for clinical note transcription with the patient, who gave verbal consent to proceed.  History of Present Illness   Johnny Rodriguez is a 73 year old male who presents with difficulty swallowing and breathing issues.  He has significant difficulty swallowing, particularly with solid foods like meat, which gets stuck in his throat and requires liquids to help it pass. He has been chewing food thoroughly to aid swallowing but still experiences obstruction at the level of the throat. This issue has been persistent and impacts his ability to eat comfortably.  He also has trouble breathing, which has been a concern for him. A doctor has previously visited him at home regarding this issue.  He experiences constipation and uses diapers due to difficulty reaching the bathroom in time. He has been using MiraLAX  but reports it is not effective enough. He is open to trying a prescription stool softener to alleviate this issue.  He uses an tree surgeon for mobility, which assists him in getting around, although he acknowledges it cannot be used for certain procedures.  He has a history of a broken shoulder, which has been fractured twice. He experiences soreness in the bone and limited range of motion, as he can only lift his arm to a certain height before it 'catches'.      CT abdomen pelvis with contrast November 18, 2022 1. No acute findings in the abdomen or pelvis. 2. Emphysematous lung disease. 3. Stable mild nodularity and thickening of bilateral adrenal glands. 4. Stable small left inguinal hernia containing fat. 5. Aortic atherosclerosis.  Outpatient Encounter Medications as of 03/19/2023  Medication  Sig   albuterol  (PROVENTIL ) (2.5 MG/3ML) 0.083% nebulizer solution Take 3 mLs (2.5 mg total) by nebulization every 4 (four) hours as needed for wheezing or shortness of breath.   aspirin  EC 81 MG tablet Take 1 tablet (81 mg total) by mouth daily. Swallow whole.   ELIQUIS  5 MG TABS tablet Take 1 tablet (5 mg total) by mouth 2 (two) times daily.   fluticasone  furoate-vilanterol (BREO ELLIPTA ) 100-25 MCG/ACT AEPB Inhale 1 puff into the lungs daily.   folic acid  (FOLVITE ) 1 MG tablet Take 1 tablet (1 mg total) by mouth daily.   gabapentin  (NEURONTIN ) 400 MG capsule Take 1 capsule (400 mg total) by mouth 3 (three) times daily.   linaclotide  (LINZESS ) 145 MCG CAPS capsule Take 1 capsule (145 mcg total) by mouth daily before breakfast.   metoprolol  succinate (TOPROL -XL) 50 MG 24 hr tablet Take 1 tablet (50 mg total) by mouth daily. Take with or immediately following a meal.   OXYGEN  Inhale 3 L into the lungs continuous.    pantoprazole  (PROTONIX ) 40 MG tablet Take 1 tablet (40 mg total) by mouth daily as needed (acid reflux).   polyethylene glycol (MIRALAX  / GLYCOLAX ) 17 g packet Take 17 g by mouth daily. (Patient taking differently: Take 17 g by mouth daily as needed for mild constipation.)   predniSONE  (DELTASONE ) 10 MG tablet Take 40 mg daily for 1 day, 30 mg daily for 1 day, 20 mg daily for 1 days,10 mg daily for 1 day, then stop   senna (SENOKOT) 8.6 MG TABS  tablet Take 1-2 tablets (8.6-17.2 mg total) by mouth daily. (Patient taking differently: Take 1-2 tablets by mouth daily as needed for moderate constipation.)   SPIRIVA  RESPIMAT 2.5 MCG/ACT AERS Inhale 2 puffs into the lungs daily.   traZODone  (DESYREL ) 50 MG tablet Take 1 tablet (50 mg total) by mouth at bedtime as needed for sleep.   VENTOLIN  HFA 108 (90 Base) MCG/ACT inhaler INHALE 1-2 PUFFS INTO THE LUNGS EVERY 6 HOURS AS NEEDED FOR WHEEZING OR SHORTNESS OF BREATH   No facility-administered encounter medications on file as of 03/19/2023.     Allergies as of 03/19/2023   (No Known Allergies)    Past Medical History:  Diagnosis Date   AF (atrial fibrillation) (HCC)    Alcohol abuse    Angina pectoris    CAD (coronary artery disease) 12/15/2004   COPD (chronic obstructive pulmonary disease) (HCC)    Coronary artery disease    Heavy cigarette smoker    HTN (hypertension)    Idiopathic cardiomyopathy (HCC) 07/16/2009   Non compliance with medical treatment    Seizures [facility])     Past Surgical History:  Procedure Laterality Date   CARDIAC CATHETERIZATION  2006   CARDIAC CATHETERIZATION  12/2004   Left    CARDIOVERSION  01/21/2011   Procedure: CARDIOVERSION;  Surgeon: LELON Jacques Blanca Mickey., MD;  Location: [facility] OR;  Service: Cardiovascular;  Laterality: N/A;   DOPPLER ECHOCARDIOGRAPHY  02/2011   ELECTROPHYSIOLOGY STUDY N/A 12/16/2011   Procedure: ELECTROPHYSIOLOGY STUDY;  Surgeon: Danelle LELON Birmingham, MD;  Location: [facility] CATH LAB;  Service: Cardiovascular;  Laterality: N/A;   EP IMPLANTABLE DEVICE N/A 08/24/2015   Procedure: Loop Recorder Removal;  Surgeon: Danelle LELON Birmingham, MD;  Location: [facility] INVASIVE CV LAB;  Service: Cardiovascular;  Laterality: N/A;   fx collar bone     NO PAST SURGERIES      Family History  Problem Relation Age of Onset   Suicidality Sister    CVA Mother    Stroke Mother    Heart disease Father    Coronary artery disease Father    COPD Father     Social History   Socioeconomic History   Marital status: Single    Spouse name: Not on file   Number of children: Not on file   Years of education: Not on file   Highest education level: Not on file  Occupational History   Occupation: Production Manager: UNEMPLOYED  Tobacco Use   Smoking status: Every Day    Current packs/day: 1.00    Average packs/day: 1 pack/day for 58.0 years (58.0 ttl pk-yrs)    Types: Cigarettes   Smokeless tobacco: Never   Tobacco comments:    pt states he is down to 1ppd 09/20/2020  Vaping Use   Vaping status:  Never Used  Substance and Sexual Activity   Alcohol use: Not Currently    Comment: occ   Drug use: Yes    Types: Marijuana   Sexual activity: Not on file  Other Topics Concern   Not on file  Social History Narrative   ** Merged History Encounter **       Social Drivers of Health   Financial Resource Strain: Low Risk  (11/21/2022)   Overall Financial Resource Strain (CARDIA)    Difficulty of Paying Living Expenses: Not hard at all  Food Insecurity: No Food Insecurity (12/06/2022)   Hunger Vital Sign    Worried About Running Out of Food in the Last Year:  Never true    Ran Out of Food in the Last Year: Never true  Transportation Needs: No Transportation Needs (12/06/2022)   PRAPARE - Administrator, Civil Service (Medical): No    Lack of Transportation (Non-Medical): No  Physical Activity: Inactive (11/21/2022)   Exercise Vital Sign    Days of Exercise per Week: 0 days    Minutes of Exercise per Session: 0 min  Stress: No Stress Concern Present (11/21/2022)   [employer] of Occupational Health - Occupational Stress Questionnaire    Feeling of Stress : Only a little  Social Connections: Socially Isolated (11/21/2022)   Social Connection and Isolation Panel [NHANES]    Frequency of Communication with Friends and Family: Never    Frequency of Social Gatherings with Friends and Family: Never    Attends Religious Services: Never    Database Administrator or Organizations: No    Attends Banker Meetings: Never    Marital Status: Never married  Intimate Partner Violence: Not At Risk (12/06/2022)   Humiliation, Afraid, Rape, and Kick questionnaire    Fear of Current or Ex-Partner: No    Emotionally Abused: No    Physically Abused: No    Sexually Abused: No      Review of systems: All other review of systems negative except as mentioned in the HPI.   Physical Exam: Vitals:   03/19/23 0951  BP: 118/70  Pulse: 81   Body mass index is 20.36  kg/m. Gen:      No acute distress, on continuous oxygen  HEENT:  sclera anicteric CV: s1s2 irregular, Lungs: B/l rhonchi with decreased breath sounds. Abd:      soft, non-tender; no palpable masses, mild distention  Ext:    +edema Neuro: alert and oriented x 3 Psych: normal mood and affect  Data Reviewed:  Reviewed labs, radiology imaging, old records and pertinent past GI work up     Assessment and Plan   73 year old male with multiple comorbidities, A-fib on anticoagulation, history of PE, CHF, CAD, chronic COPD on 2 to 3 L continuous home oxygen  Dysphagia Chronic difficulty swallowing, particularly with solid foods. Symptoms include food impaction requiring liquids to pass. Differential diagnosis includes esophageal stricture, esophageal ring, polyp, or nodule. High risk for food impaction. Discussed upper endoscopy, including potential biopsies or esophageal dilation. Emphasized the importance of performing the procedure at [facility] due to high-risk status, with cardiologists, pulmonologists, and anesthesiologists available for complications. Risks and benefits discussed, noting the procedure's high risk but potential to significantly improve quality of life. - Schedule upper endoscopy at [facility] - Ensure patient is under general anesthesia during the procedure - Perform biopsies or esophageal dilation if necessary - Obtain clearance from cardiologist and pulmonologist - Advise patient to avoid tough meats and opt for soft foods like mac and cheese, chili beans, and well-chewed spaghetti and meatballs  Constipation Chronic constipation requiring diapers. Current MiraLAX  use is insufficient. Discussed prescribing Linzess  to improve bowel movements and the need for dose adjustment based on effectiveness. - Prescribe Linzess  145 mcg daily - Advise patient to report if the dose is too strong or insufficient for adjustment  General Health Maintenance Colonoscopy  deferred due to current swallowing issues and high cardiopulmonary risk. Prioritize addressing swallowing issues first. - Defer colonoscopy until swallowing issues are resolved and patient can better tolerate the procedure  Follow-up - Call patient with the date and time for the upper endoscopy - Ensure patient has  an escort for the procedure - Provide pre-procedure instructions before patient leaves the clinic.       This visit required 40 minutes of patient care (this includes precharting, chart review, review of results, face-to-face time used for counseling as well as treatment plan and follow-up. The patient was provided an opportunity to ask questions and all were answered. The patient agreed with the plan and demonstrated an understanding of the instructions.  LOIS Wilkie Mcgee , MD    CC: Lorren Greig PARAS, NP

## 2023-03-20 ENCOUNTER — Encounter: Payer: Self-pay | Admitting: Gastroenterology

## 2023-03-24 NOTE — Telephone Encounter (Signed)
Spoke with patient and aware a Schedular will contact him for an up coming appointment.

## 2023-03-24 NOTE — Telephone Encounter (Signed)
Primary Cardiologist:None  Chart reviewed as part of pre-operative protocol coverage. Because of Johnny Rodriguez's past medical history and time since last visit, he/she will require a follow-up visit in order to better assess preoperative cardiovascular risk.  Pre-op covering staff: - Please schedule appointment and call patient to inform them. - Please contact requesting surgeon's office via preferred method (i.e, phone, fax) to inform them of need for appointment prior to surgery.  Patient has not been seen since 2022. We cannot provide guidance on holding Eliquis due to time since last office visit.  [name], NP-C  03/24/2023, 1:12 PM [address] Office [phone] Fax [phone]

## 2023-03-24 NOTE — Telephone Encounter (Signed)
Patient hasn't been seen by cardiology for 2 years. Eliquis is being rx by Dr. Jerral Ralph

## 2023-04-07 ENCOUNTER — Telehealth: Payer: Self-pay

## 2023-04-07 NOTE — Telephone Encounter (Signed)
 Procedure:Endo Procedure date: 04/13/23 Procedure location: [facility] Arrival Time: 8am Spoke with the patient Y/N: y Any prep concerns? n  Has the patient obtained the prep from the pharmacy ? n Do you have a care partner and transportation: y Any additional concerns? n
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
## 2023-04-09 ENCOUNTER — Encounter: Payer: Self-pay | Admitting: Pulmonary Disease

## 2023-04-09 ENCOUNTER — Ambulatory Visit: Payer: Medicare HMO | Attending: Pulmonary Disease | Admitting: Pulmonary Disease

## 2023-04-09 VITALS — BP 107/65 | HR 127 | Ht 71.0 in | Wt 168.0 lb

## 2023-04-09 DIAGNOSIS — J441 Chronic obstructive pulmonary disease with (acute) exacerbation: Secondary | ICD-10-CM

## 2023-04-09 DIAGNOSIS — D6869 Other thrombophilia: Secondary | ICD-10-CM

## 2023-04-09 DIAGNOSIS — I10 Essential (primary) hypertension: Secondary | ICD-10-CM

## 2023-04-09 DIAGNOSIS — I480 Paroxysmal atrial fibrillation: Secondary | ICD-10-CM | POA: Diagnosis not present

## 2023-04-09 DIAGNOSIS — R0602 Shortness of breath: Secondary | ICD-10-CM

## 2023-04-09 NOTE — Patient Instructions (Addendum)
 Medication Instructions:  Your physician recommends that you continue on your current medications as directed. Please refer to the Current Medication list given to you today.  *If you need a refill on your cardiac medications before your next appointment, please call your pharmacy*   Lab Work: None ordered If you have labs (blood work) drawn today and your tests are completely normal, you will receive your results only by: MyChart Message (if you have MyChart) OR A paper copy in the mail If you have any lab test that is abnormal or we need to change your treatment, we will call you to review the results.   Testing/Procedures: Your physician has requested that you have an echocardiogram. Echocardiography is a painless test that uses sound waves to create images of your heart. It provides your doctor with information about the size and shape of your heart and how well your heart's chambers and valves are working. This procedure takes approximately one hour. There are no restrictions for this procedure. Please do NOT wear cologne, perfume, aftershave, or lotions (deodorant is allowed). Please arrive 15 minutes prior to your appointment time.  Please note: We ask at that you not bring children with you during ultrasound (echo/ vascular) testing. Due to room size and safety concerns, children are not allowed in the ultrasound rooms during exams. Our front office staff cannot provide observation of children in our lobby area while testing is being conducted. An adult accompanying a patient to their appointment will only be allowed in the ultrasound room at the discretion of the ultrasound technician under special circumstances. We apologize for any inconvenience.    Follow-Up: At [facility], you and your health needs are our priority.  As part of our continuing mission to provide you with exceptional heart care, we have created designated Provider Care Teams.  These Care Teams include your  primary Cardiologist (physician) and Advanced Practice Providers (APPs -  Physician Assistants and Nurse Practitioners) who all work together to provide you with the care you need, when you need it.  We recommend signing up for the patient portal called "MyChart".  Sign up information is provided on this After Visit Summary.  MyChart is used to connect with patients for Virtual Visits (Telemedicine).  Patients are able to view lab/test results, encounter notes, upcoming appointments, etc.  Non-urgent messages can be sent to your provider as well.   To learn more about what you can do with MyChart, go to [email or web address].    Your next appointment:   1 month(s)  Provider:   You will see one of the following Advanced Practice Providers on your designated Care Team:   Francis Dowse, Charlott Holler [address]" [place], [place] Sherie Don, NP Canary Brim, NP  Other Instructions 1.Call your primary care provider today to schedule an appointment and to have your medications refilled [phone] 2.You have been referred to pulmonology, you will be receiving a call to schedule an appointment

## 2023-04-10 ENCOUNTER — Telehealth: Payer: Self-pay

## 2023-04-10 NOTE — Telephone Encounter (Signed)
 Patient was evaluated by Cardiology on 04-09-23.  He was not given clearance to have EGD scheduled on 04-13-23 at [facility].  Cardiology ordered an ECHO and referred the patient to Pulmonary prior to having his procedure.  Patient is aware that he should not proceed to [facility] on 04-13-23.  He will be contacted to reschedule.

## 2023-04-13 ENCOUNTER — Encounter (HOSPITAL_COMMUNITY): Admission: RE | Payer: Self-pay | Source: Home / Self Care

## 2023-04-13 ENCOUNTER — Encounter (HOSPITAL_COMMUNITY): Payer: Self-pay

## 2023-04-13 ENCOUNTER — Ambulatory Visit (HOSPITAL_COMMUNITY): Admission: RE | Admit: 2023-04-13 | Payer: Medicare HMO | Source: Home / Self Care | Admitting: Gastroenterology

## 2023-04-13 ENCOUNTER — Encounter (HOSPITAL_COMMUNITY): Payer: Self-pay | Admitting: Gastroenterology

## 2023-04-13 SURGERY — ESOPHAGOGASTRODUODENOSCOPY (EGD) WITH PROPOFOL
Anesthesia: Monitor Anesthesia Care

## 2023-04-21 ENCOUNTER — Other Ambulatory Visit (HOSPITAL_COMMUNITY): Payer: Self-pay

## 2023-04-30 ENCOUNTER — Ambulatory Visit (HOSPITAL_COMMUNITY): Payer: Medicare HMO | Attending: Cardiovascular Disease

## 2023-04-30 DIAGNOSIS — R0602 Shortness of breath: Secondary | ICD-10-CM | POA: Insufficient documentation

## 2023-04-30 LAB — ECHOCARDIOGRAM COMPLETE: S' Lateral: 3.9 cm

## 2023-05-06 ENCOUNTER — Other Ambulatory Visit: Payer: Self-pay

## 2023-05-06 ENCOUNTER — Other Ambulatory Visit (HOSPITAL_COMMUNITY)

## 2023-05-06 ENCOUNTER — Encounter (HOSPITAL_COMMUNITY): Payer: Self-pay

## 2023-05-06 ENCOUNTER — Inpatient Hospital Stay (HOSPITAL_COMMUNITY)

## 2023-05-06 ENCOUNTER — Emergency Department (HOSPITAL_COMMUNITY)

## 2023-05-06 ENCOUNTER — Inpatient Hospital Stay (HOSPITAL_COMMUNITY)
Admission: EM | Admit: 2023-05-06 | Discharge: 2023-05-16 | DRG: 208 | Disposition: A | Discharge status: Home Health | Attending: Internal Medicine | Admitting: Internal Medicine

## 2023-05-06 DIAGNOSIS — Z825 Family history of asthma and other chronic lower respiratory diseases: Secondary | ICD-10-CM

## 2023-05-06 DIAGNOSIS — J439 Emphysema, unspecified: Secondary | ICD-10-CM | POA: Diagnosis present

## 2023-05-06 DIAGNOSIS — G4700 Insomnia, unspecified: Secondary | ICD-10-CM | POA: Diagnosis not present

## 2023-05-06 DIAGNOSIS — F17210 Nicotine dependence, cigarettes, uncomplicated: Secondary | ICD-10-CM | POA: Diagnosis present

## 2023-05-06 DIAGNOSIS — R54 Age-related physical debility: Secondary | ICD-10-CM | POA: Diagnosis present

## 2023-05-06 DIAGNOSIS — I2510 Atherosclerotic heart disease of native coronary artery without angina pectoris: Secondary | ICD-10-CM | POA: Diagnosis present

## 2023-05-06 DIAGNOSIS — E874 Mixed disorder of acid-base balance: Secondary | ICD-10-CM | POA: Diagnosis not present

## 2023-05-06 DIAGNOSIS — T380X5A Adverse effect of glucocorticoids and synthetic analogues, initial encounter: Secondary | ICD-10-CM | POA: Diagnosis present

## 2023-05-06 DIAGNOSIS — E440 Moderate protein-calorie malnutrition: Secondary | ICD-10-CM | POA: Diagnosis present

## 2023-05-06 DIAGNOSIS — J9622 Acute and chronic respiratory failure with hypercapnia: Secondary | ICD-10-CM | POA: Diagnosis present

## 2023-05-06 DIAGNOSIS — G894 Chronic pain syndrome: Secondary | ICD-10-CM | POA: Diagnosis present

## 2023-05-06 DIAGNOSIS — I4821 Permanent atrial fibrillation: Secondary | ICD-10-CM | POA: Diagnosis present

## 2023-05-06 DIAGNOSIS — Z6824 Body mass index (BMI) 24.0-24.9, adult: Secondary | ICD-10-CM

## 2023-05-06 DIAGNOSIS — Z8249 Family history of ischemic heart disease and other diseases of the circulatory system: Secondary | ICD-10-CM

## 2023-05-06 DIAGNOSIS — I480 Paroxysmal atrial fibrillation: Secondary | ICD-10-CM

## 2023-05-06 DIAGNOSIS — J441 Chronic obstructive pulmonary disease with (acute) exacerbation: Secondary | ICD-10-CM | POA: Diagnosis present

## 2023-05-06 DIAGNOSIS — K761 Chronic passive congestion of liver: Secondary | ICD-10-CM | POA: Diagnosis present

## 2023-05-06 DIAGNOSIS — I110 Hypertensive heart disease with heart failure: Secondary | ICD-10-CM | POA: Diagnosis present

## 2023-05-06 DIAGNOSIS — J9602 Acute respiratory failure with hypercapnia: Secondary | ICD-10-CM | POA: Diagnosis not present

## 2023-05-06 DIAGNOSIS — J9621 Acute and chronic respiratory failure with hypoxia: Secondary | ICD-10-CM | POA: Diagnosis present

## 2023-05-06 DIAGNOSIS — I2699 Other pulmonary embolism without acute cor pulmonale: Secondary | ICD-10-CM | POA: Diagnosis not present

## 2023-05-06 DIAGNOSIS — Z9981 Dependence on supplemental oxygen: Secondary | ICD-10-CM

## 2023-05-06 DIAGNOSIS — J440 Chronic obstructive pulmonary disease with acute lower respiratory infection: Secondary | ICD-10-CM | POA: Diagnosis present

## 2023-05-06 DIAGNOSIS — Y92239 Unspecified place in hospital as the place of occurrence of the external cause: Secondary | ICD-10-CM | POA: Diagnosis not present

## 2023-05-06 DIAGNOSIS — J14 Pneumonia due to Hemophilus influenzae: Secondary | ICD-10-CM | POA: Diagnosis present

## 2023-05-06 DIAGNOSIS — J449 Chronic obstructive pulmonary disease, unspecified: Secondary | ICD-10-CM | POA: Diagnosis not present

## 2023-05-06 DIAGNOSIS — R570 Cardiogenic shock: Secondary | ICD-10-CM | POA: Diagnosis present

## 2023-05-06 DIAGNOSIS — R7401 Elevation of levels of liver transaminase levels: Secondary | ICD-10-CM | POA: Diagnosis not present

## 2023-05-06 DIAGNOSIS — N179 Acute kidney failure, unspecified: Secondary | ICD-10-CM | POA: Diagnosis present

## 2023-05-06 DIAGNOSIS — E875 Hyperkalemia: Secondary | ICD-10-CM | POA: Diagnosis present

## 2023-05-06 DIAGNOSIS — R739 Hyperglycemia, unspecified: Secondary | ICD-10-CM | POA: Diagnosis not present

## 2023-05-06 DIAGNOSIS — Z79899 Other long term (current) drug therapy: Secondary | ICD-10-CM

## 2023-05-06 DIAGNOSIS — I471 Supraventricular tachycardia, unspecified: Secondary | ICD-10-CM | POA: Diagnosis present

## 2023-05-06 DIAGNOSIS — Z7901 Long term (current) use of anticoagulants: Secondary | ICD-10-CM

## 2023-05-06 DIAGNOSIS — J9601 Acute respiratory failure with hypoxia: Secondary | ICD-10-CM

## 2023-05-06 DIAGNOSIS — Z7982 Long term (current) use of aspirin: Secondary | ICD-10-CM

## 2023-05-06 DIAGNOSIS — Z66 Do not resuscitate: Secondary | ICD-10-CM | POA: Diagnosis present

## 2023-05-06 DIAGNOSIS — I429 Cardiomyopathy, unspecified: Secondary | ICD-10-CM | POA: Diagnosis present

## 2023-05-06 DIAGNOSIS — M6250 Muscle wasting and atrophy, not elsewhere classified, unspecified site: Secondary | ICD-10-CM | POA: Diagnosis present

## 2023-05-06 DIAGNOSIS — J9691 Respiratory failure, unspecified with hypoxia: Secondary | ICD-10-CM | POA: Diagnosis present

## 2023-05-06 DIAGNOSIS — R64 Cachexia: Secondary | ICD-10-CM | POA: Diagnosis present

## 2023-05-06 DIAGNOSIS — Z86711 Personal history of pulmonary embolism: Secondary | ICD-10-CM

## 2023-05-06 DIAGNOSIS — Z823 Family history of stroke: Secondary | ICD-10-CM

## 2023-05-06 DIAGNOSIS — R579 Shock, unspecified: Secondary | ICD-10-CM | POA: Diagnosis not present

## 2023-05-06 LAB — CBC
HCT: 41.7 % (ref 39.0–52.0)
Hemoglobin: 13.5 g/dL (ref 13.0–17.0)
MCH: 31.4 pg (ref 26.0–34.0)
MCHC: 32.4 g/dL (ref 30.0–36.0)
MCV: 97 fL (ref 80.0–100.0)
Platelets: 269 10*3/uL (ref 150–400)
RBC: 4.3 MIL/uL (ref 4.22–5.81)
RDW: 13.6 % (ref 11.5–15.5)
WBC: 10 10*3/uL (ref 4.0–10.5)
nRBC: 0.2 % (ref 0.0–0.2)

## 2023-05-06 LAB — RESPIRATORY PANEL BY PCR

## 2023-05-06 LAB — RESP PANEL BY RT-PCR (RSV, FLU A&B, COVID)  RVPGX2
Influenza A by PCR: NEGATIVE
Influenza B by PCR: NEGATIVE
Resp Syncytial Virus by PCR: NEGATIVE
SARS Coronavirus 2 by RT PCR: NEGATIVE

## 2023-05-06 LAB — COMPREHENSIVE METABOLIC PANEL
ALT: 979 U/L — ABNORMAL HIGH (ref 0–44)
AST: 1255 U/L — ABNORMAL HIGH (ref 15–41)
Albumin: 3.5 g/dL (ref 3.5–5.0)
Alkaline Phosphatase: 72 U/L (ref 38–126)
Anion gap: 15 (ref 5–15)
BUN: 55 mg/dL — ABNORMAL HIGH (ref 8–23)
CO2: 29 mmol/L (ref 22–32)
Calcium: 9.2 mg/dL (ref 8.9–10.3)
Chloride: 91 mmol/L — ABNORMAL LOW (ref 98–111)
Creatinine, Ser: 2.27 mg/dL — ABNORMAL HIGH (ref 0.61–1.24)
GFR, Estimated: 30 mL/min — ABNORMAL LOW (ref >60)
Glucose, Bld: 152 mg/dL — ABNORMAL HIGH (ref 70–99)
Potassium: 5.4 mmol/L — ABNORMAL HIGH (ref 3.5–5.1)
Sodium: 135 mmol/L (ref 135–145)
Total Bilirubin: 1.2 mg/dL (ref 0.0–1.2)
Total Protein: 7.9 g/dL (ref 6.5–8.1)

## 2023-05-06 LAB — I-STAT ARTERIAL BLOOD GAS, ED
Acid-Base Excess: 2 mmol/L (ref 0.0–2.0)
Acid-Base Excess: 6 mmol/L — ABNORMAL HIGH (ref 0.0–2.0)
Bicarbonate: 33.5 mmol/L — ABNORMAL HIGH (ref 20.0–28.0)
Bicarbonate: 32.6 mmol/L — ABNORMAL HIGH (ref 20.0–28.0)
Calcium, Ion: 1.2 mmol/L (ref 1.15–1.40)
Calcium, Ion: 1.03 mmol/L — ABNORMAL LOW (ref 1.15–1.40)
HCT: 45 % (ref 39.0–52.0)
HCT: 41 % (ref 39.0–52.0)
Hemoglobin: 15.3 g/dL (ref 13.0–17.0)
Hemoglobin: 13.9 g/dL (ref 13.0–17.0)
O2 Saturation: 77 %
O2 Saturation: 100 %
Potassium: 5.3 mmol/L — ABNORMAL HIGH (ref 3.5–5.1)
Potassium: 4.7 mmol/L (ref 3.5–5.1)
Sodium: 133 mmol/L — ABNORMAL LOW (ref 135–145)
Sodium: 136 mmol/L (ref 135–145)
TCO2: 36 mmol/L — ABNORMAL HIGH (ref 22–32)
TCO2: 34 mmol/L — ABNORMAL HIGH (ref 22–32)
pCO2 arterial: 88.8 mmHg (ref 32–48)
pCO2 arterial: 54.6 mmHg — ABNORMAL HIGH (ref 32–48)
pH, Arterial: 7.185 — CL (ref 7.35–7.45)
pH, Arterial: 7.383 (ref 7.35–7.45)
pO2, Arterial: 54 mmHg — ABNORMAL LOW (ref 83–108)
pO2, Arterial: 540 mmHg — ABNORMAL HIGH (ref 83–108)

## 2023-05-06 LAB — CBC WITH DIFFERENTIAL/PLATELET
Abs Immature Granulocytes: 0.1 10*3/uL — ABNORMAL HIGH (ref 0.00–0.07)
Basophils Absolute: 0.1 10*3/uL (ref 0.0–0.1)
Basophils Relative: 1 %
Eosinophils Absolute: 0 10*3/uL (ref 0.0–0.5)
Eosinophils Relative: 1 %
HCT: 48.6 % (ref 39.0–52.0)
Hemoglobin: 15.1 g/dL (ref 13.0–17.0)
Immature Granulocytes: 1 %
Lymphocytes Relative: 13 %
Lymphs Abs: 1.2 10*3/uL (ref 0.7–4.0)
MCH: 31.3 pg (ref 26.0–34.0)
MCHC: 31.1 g/dL (ref 30.0–36.0)
MCV: 100.6 fL — ABNORMAL HIGH (ref 80.0–100.0)
Monocytes Absolute: 1.2 10*3/uL — ABNORMAL HIGH (ref 0.1–1.0)
Monocytes Relative: 14 %
Neutro Abs: 6.1 10*3/uL (ref 1.7–7.7)
Neutrophils Relative %: 70 %
Platelets: 319 10*3/uL (ref 150–400)
RBC: 4.83 MIL/uL (ref 4.22–5.81)
RDW: 13.6 % (ref 11.5–15.5)
WBC: 8.7 10*3/uL (ref 4.0–10.5)
nRBC: 0.2 % (ref 0.0–0.2)

## 2023-05-06 LAB — CG4 I-STAT (LACTIC ACID): Lactic Acid, Venous: 4.1 mmol/L (ref 0.5–1.9)

## 2023-05-06 LAB — I-STAT VENOUS BLOOD GAS, ED
Acid-Base Excess: 3 mmol/L — ABNORMAL HIGH (ref 0.0–2.0)
Bicarbonate: 35.8 mmol/L — ABNORMAL HIGH (ref 20.0–28.0)
Calcium, Ion: 1.1 mmol/L — ABNORMAL LOW (ref 1.15–1.40)
HCT: 47 % (ref 39.0–52.0)
Hemoglobin: 16 g/dL (ref 13.0–17.0)
O2 Saturation: 87 %
Potassium: 5.6 mmol/L — ABNORMAL HIGH (ref 3.5–5.1)
Sodium: 134 mmol/L — ABNORMAL LOW (ref 135–145)
TCO2: 39 mmol/L — ABNORMAL HIGH (ref 22–32)
pCO2, Ven: 100.6 mmHg (ref 44–60)
pH, Ven: 7.159 — CL (ref 7.25–7.43)
pO2, Ven: 71 mmHg — ABNORMAL HIGH (ref 32–45)

## 2023-05-06 LAB — URINALYSIS, ROUTINE W REFLEX MICROSCOPIC
Bilirubin Urine: NEGATIVE
Glucose, UA: 50 mg/dL — AB
Ketones, ur: 5 mg/dL — AB
Leukocytes,Ua: NEGATIVE
Nitrite: NEGATIVE
Protein, ur: 100 mg/dL — AB
Specific Gravity, Urine: 1.017 (ref 1.005–1.030)
pH: 5 (ref 5.0–8.0)

## 2023-05-06 LAB — BRAIN NATRIURETIC PEPTIDE
B Natriuretic Peptide: 905.9 pg/mL — ABNORMAL HIGH (ref 0.0–100.0)
B Natriuretic Peptide: 828.9 pg/mL — ABNORMAL HIGH (ref 0.0–100.0)

## 2023-05-06 LAB — MAGNESIUM: Magnesium: 1.9 mg/dL (ref 1.7–2.4)

## 2023-05-06 LAB — CREATININE, SERUM
Creatinine, Ser: 1.98 mg/dL — ABNORMAL HIGH (ref 0.61–1.24)
GFR, Estimated: 35 mL/min — ABNORMAL LOW (ref >60)

## 2023-05-06 LAB — TSH: TSH: 1.176 u[IU]/mL (ref 0.350–4.500)

## 2023-05-06 LAB — GLUCOSE, CAPILLARY
Glucose-Capillary: 195 mg/dL — ABNORMAL HIGH (ref 70–99)
Glucose-Capillary: 254 mg/dL — ABNORMAL HIGH (ref 70–99)
Glucose-Capillary: 272 mg/dL — ABNORMAL HIGH (ref 70–99)

## 2023-05-06 LAB — HEMOGLOBIN A1C
Hgb A1c MFr Bld: 6.1 % — ABNORMAL HIGH (ref 4.8–5.6)
Mean Plasma Glucose: 128.37 mg/dL

## 2023-05-06 LAB — TROPONIN I (HIGH SENSITIVITY)
Troponin I (High Sensitivity): 24 ng/L — ABNORMAL HIGH (ref <18)
Troponin I (High Sensitivity): 24 ng/L — ABNORMAL HIGH (ref <18)

## 2023-05-06 LAB — MRSA NEXT GEN BY PCR, NASAL: MRSA by PCR Next Gen: NOT DETECTED

## 2023-05-06 LAB — PHOSPHORUS: Phosphorus: 4.1 mg/dL (ref 2.5–4.6)

## 2023-05-06 MED ORDER — AMIODARONE LOAD VIA INFUSION
150.0000 mg | Freq: Once | INTRAVENOUS | Status: AC
  Administered 2023-05-06: 150 mg via INTRAVENOUS
  Filled 2023-05-06: qty 83.34

## 2023-05-06 MED ORDER — FUROSEMIDE 10 MG/ML IJ SOLN
80.0000 mg | Freq: Once | INTRAMUSCULAR | Status: AC
  Administered 2023-05-06: 80 mg via INTRAVENOUS
  Filled 2023-05-06: qty 8

## 2023-05-06 MED ORDER — PROSOURCE TF20 ENFIT COMPATIBL EN LIQD
60.0000 mL | Freq: Every day | ENTERAL | Status: DC
  Administered 2023-05-07: 60 mL
  Filled 2023-05-06: qty 60

## 2023-05-06 MED ORDER — PROPOFOL 1000 MG/100ML IV EMUL: 5.0000 ug/kg/min | INTRAVENOUS | Status: DC

## 2023-05-06 MED ORDER — NOREPINEPHRINE 4 MG/250ML-% IV SOLN
INTRAVENOUS | Status: AC
  Filled 2023-05-06: qty 250

## 2023-05-06 MED ORDER — VITAL HIGH PROTEIN PO LIQD: 1000.0000 mL | ORAL | Status: DC

## 2023-05-06 MED ORDER — SODIUM CHLORIDE 0.9 % IV SOLN: INTRAVENOUS | Status: AC

## 2023-05-06 MED ORDER — LORAZEPAM 2 MG/ML IJ SOLN
0.5000 mg | Freq: Once | INTRAMUSCULAR | Status: AC
  Administered 2023-05-06: 0.5 mg via INTRAVENOUS
  Filled 2023-05-06: qty 1

## 2023-05-06 MED ORDER — DOCUSATE SODIUM 50 MG/5ML PO LIQD: 100.0000 mg | Freq: Two times a day (BID) | ORAL | Status: DC | PRN

## 2023-05-06 MED ORDER — SODIUM BICARBONATE 8.4 % IV SOLN
INTRAVENOUS | Status: AC
  Filled 2023-05-06: qty 50

## 2023-05-06 MED ORDER — ORAL CARE MOUTH RINSE: 15.0000 mL | OROMUCOSAL | Status: DC | PRN

## 2023-05-06 MED ORDER — FENTANYL CITRATE PF 50 MCG/ML IJ SOSY
25.0000 ug | PREFILLED_SYRINGE | INTRAMUSCULAR | Status: DC | PRN
  Administered 2023-05-06: 25 ug via INTRAVENOUS
  Administered 2023-05-07: 50 ug via INTRAVENOUS
  Administered 2023-05-07: 25 ug via INTRAVENOUS

## 2023-05-06 MED ORDER — SODIUM CHLORIDE 0.9 % IV SOLN
500.0000 mg | INTRAVENOUS | Status: DC
  Administered 2023-05-06 – 2023-05-07 (×2): 500 mg via INTRAVENOUS
  Filled 2023-05-06 (×3): qty 5

## 2023-05-06 MED ORDER — POLYETHYLENE GLYCOL 3350 17 G PO PACK
17.0000 g | PACK | Freq: Every day | ORAL | Status: DC
  Administered 2023-05-07 – 2023-05-08 (×2): 17 g
  Filled 2023-05-06 (×2): qty 1

## 2023-05-06 MED ORDER — METHYLPREDNISOLONE SODIUM SUCC 40 MG IJ SOLR
40.0000 mg | Freq: Two times a day (BID) | INTRAMUSCULAR | Status: DC
  Administered 2023-05-07 – 2023-05-09 (×5): 40 mg via INTRAVENOUS
  Filled 2023-05-06 (×5): qty 1

## 2023-05-06 MED ORDER — AMIODARONE HCL IN DEXTROSE 360-4.14 MG/200ML-% IV SOLN
30.0000 mg/h | INTRAVENOUS | Status: DC
  Administered 2023-05-07 – 2023-05-09 (×5): 30 mg/h via INTRAVENOUS
  Filled 2023-05-06 (×5): qty 200

## 2023-05-06 MED ORDER — FENTANYL 2500MCG IN NS 250ML (10MCG/ML) PREMIX INFUSION
0.0000 ug/h | INTRAVENOUS | Status: DC
  Administered 2023-05-06: 50 ug/h via INTRAVENOUS
  Administered 2023-05-07: 75 ug/h via INTRAVENOUS
  Filled 2023-05-06: qty 250

## 2023-05-06 MED ORDER — SODIUM BICARBONATE 8.4 % IV SOLN
INTRAVENOUS | Status: AC | PRN
  Administered 2023-05-06 (×2): 50 meq via INTRAVENOUS

## 2023-05-06 MED ORDER — PROPOFOL 1000 MG/100ML IV EMUL: 0.0000 ug/kg/min | INTRAVENOUS | Status: DC

## 2023-05-06 MED ORDER — ALBUTEROL SULFATE (2.5 MG/3ML) 0.083% IN NEBU: 2.5000 mg | INHALATION_SOLUTION | RESPIRATORY_TRACT | Status: DC | PRN

## 2023-05-06 MED ORDER — POLYETHYLENE GLYCOL 3350 17 G PO PACK: 17.0000 g | PACK | Freq: Every day | ORAL | Status: DC | PRN

## 2023-05-06 MED ORDER — QUETIAPINE FUMARATE 50 MG PO TABS
50.0000 mg | ORAL_TABLET | Freq: Two times a day (BID) | ORAL | Status: DC
  Administered 2023-05-06 – 2023-05-08 (×4): 50 mg
  Filled 2023-05-06 (×4): qty 1

## 2023-05-06 MED ORDER — AMIODARONE HCL IN DEXTROSE 360-4.14 MG/200ML-% IV SOLN
60.0000 mg/h | INTRAVENOUS | Status: DC
  Administered 2023-05-06 (×2): 60 mg/h via INTRAVENOUS
  Filled 2023-05-06: qty 400

## 2023-05-06 MED ORDER — ROCURONIUM BROMIDE 10 MG/ML (PF) SYRINGE
PREFILLED_SYRINGE | INTRAVENOUS | Status: AC | PRN
  Administered 2023-05-06: 100 mg via INTRAVENOUS

## 2023-05-06 MED ORDER — ARFORMOTEROL TARTRATE 15 MCG/2ML IN NEBU
15.0000 ug | INHALATION_SOLUTION | Freq: Two times a day (BID) | RESPIRATORY_TRACT | Status: DC
  Administered 2023-05-06 – 2023-05-09 (×6): 15 ug via RESPIRATORY_TRACT
  Filled 2023-05-06 (×6): qty 2

## 2023-05-06 MED ORDER — FENTANYL 2500MCG IN NS 250ML (10MCG/ML) PREMIX INFUSION
INTRAVENOUS | Status: AC
  Administered 2023-05-06: 50 ug/h
  Filled 2023-05-06: qty 250

## 2023-05-06 MED ORDER — VASOPRESSIN 20 UNITS/100 ML INFUSION FOR SHOCK
0.0000 [IU]/min | INTRAVENOUS | Status: DC
  Administered 2023-05-06: 0.03 [IU]/min via INTRAVENOUS
  Filled 2023-05-06: qty 100

## 2023-05-06 MED ORDER — PROPOFOL 1000 MG/100ML IV EMUL
0.0000 ug/kg/min | INTRAVENOUS | Status: DC
  Administered 2023-05-06: 10 ug/kg/min via INTRAVENOUS
  Administered 2023-05-07 – 2023-05-08 (×4): 20 ug/kg/min via INTRAVENOUS
  Filled 2023-05-06 (×4): qty 100

## 2023-05-06 MED ORDER — CHLORHEXIDINE GLUCONATE CLOTH 2 % EX PADS
6.0000 | MEDICATED_PAD | Freq: Every day | CUTANEOUS | Status: DC
  Administered 2023-05-06 – 2023-05-16 (×8): 6 via TOPICAL

## 2023-05-06 MED ORDER — ORAL CARE MOUTH RINSE
15.0000 mL | OROMUCOSAL | Status: DC
  Administered 2023-05-06 – 2023-05-08 (×18): 15 mL via OROMUCOSAL

## 2023-05-06 MED ORDER — KETAMINE HCL 10 MG/ML IJ SOLN
INTRAMUSCULAR | Status: AC | PRN
  Administered 2023-05-06: 70 mg via INTRAVENOUS

## 2023-05-06 MED ORDER — ONDANSETRON HCL 4 MG/2ML IJ SOLN
4.0000 mg | Freq: Four times a day (QID) | INTRAMUSCULAR | Status: DC | PRN
Start: 2023-05-06 — End: 2023-05-16

## 2023-05-06 MED ORDER — ALBUTEROL SULFATE (2.5 MG/3ML) 0.083% IN NEBU
5.0000 mg | INHALATION_SOLUTION | Freq: Once | RESPIRATORY_TRACT | Status: AC
  Administered 2023-05-06: 5 mg via RESPIRATORY_TRACT
  Filled 2023-05-06: qty 6

## 2023-05-06 MED ORDER — METHYLPREDNISOLONE SODIUM SUCC 125 MG IJ SOLR
125.0000 mg | Freq: Once | INTRAMUSCULAR | Status: AC
  Administered 2023-05-06: 125 mg via INTRAVENOUS
  Filled 2023-05-06: qty 2

## 2023-05-06 MED ORDER — FENTANYL CITRATE PF 50 MCG/ML IJ SOSY
25.0000 ug | PREFILLED_SYRINGE | INTRAMUSCULAR | Status: DC | PRN
  Administered 2023-05-07: 25 ug via INTRAVENOUS

## 2023-05-06 MED ORDER — FAMOTIDINE 20 MG PO TABS
20.0000 mg | ORAL_TABLET | Freq: Every day | ORAL | Status: DC
  Administered 2023-05-07 – 2023-05-08 (×2): 20 mg
  Filled 2023-05-06 (×2): qty 1

## 2023-05-06 MED ORDER — SODIUM CHLORIDE 0.9 % IV SOLN
2.0000 g | INTRAVENOUS | Status: DC
  Administered 2023-05-06: 2 g via INTRAVENOUS
  Filled 2023-05-06: qty 20

## 2023-05-06 MED ORDER — DOCUSATE SODIUM 50 MG/5ML PO LIQD
100.0000 mg | Freq: Two times a day (BID) | ORAL | Status: DC
  Administered 2023-05-06 – 2023-05-08 (×4): 100 mg
  Filled 2023-05-06 (×4): qty 10

## 2023-05-06 MED ORDER — REVEFENACIN 175 MCG/3ML IN SOLN
175.0000 ug | Freq: Every day | RESPIRATORY_TRACT | Status: DC
  Administered 2023-05-07 – 2023-05-09 (×3): 175 ug via RESPIRATORY_TRACT
  Filled 2023-05-06 (×3): qty 3

## 2023-05-06 MED ORDER — FAMOTIDINE 20 MG PO TABS: 20.0000 mg | ORAL_TABLET | Freq: Two times a day (BID) | ORAL | Status: DC

## 2023-05-06 MED ORDER — INSULIN ASPART 100 UNIT/ML IJ SOLN
0.0000 [IU] | INTRAMUSCULAR | Status: DC
  Administered 2023-05-06 (×2): 5 [IU] via SUBCUTANEOUS
  Administered 2023-05-07: 3 [IU] via SUBCUTANEOUS
  Administered 2023-05-07: 5 [IU] via SUBCUTANEOUS
  Administered 2023-05-07: 3 [IU] via SUBCUTANEOUS

## 2023-05-06 MED ORDER — BUDESONIDE 0.25 MG/2ML IN SUSP
0.2500 mg | Freq: Two times a day (BID) | RESPIRATORY_TRACT | Status: DC
  Administered 2023-05-06 – 2023-05-09 (×6): 0.25 mg via RESPIRATORY_TRACT
  Filled 2023-05-06 (×6): qty 2

## 2023-05-06 MED ORDER — NOREPINEPHRINE 4 MG/250ML-% IV SOLN
0.0000 ug/min | INTRAVENOUS | Status: DC
  Administered 2023-05-06: 7 ug/min via INTRAVENOUS
  Administered 2023-05-06: 18 ug/min via INTRAVENOUS
  Administered 2023-05-07: 7 ug/min via INTRAVENOUS
  Administered 2023-05-08: 2 ug/min via INTRAVENOUS
  Filled 2023-05-06 (×3): qty 250

## 2023-05-06 MED ORDER — ENOXAPARIN SODIUM 40 MG/0.4ML IJ SOSY
40.0000 mg | PREFILLED_SYRINGE | INTRAMUSCULAR | Status: DC
  Administered 2023-05-06 – 2023-05-08 (×3): 40 mg via SUBCUTANEOUS
  Filled 2023-05-06 (×3): qty 0.4

## 2023-05-06 MED ORDER — KETAMINE HCL 50 MG/5ML IJ SOSY
70.0000 mg | PREFILLED_SYRINGE | Freq: Once | INTRAMUSCULAR | Status: DC
  Filled 2023-05-06: qty 10

## 2023-05-06 NOTE — Progress Notes (Signed)
   05/06/23 1428  BiPAP/CPAP/SIPAP  $ Non-Invasive Ventilator  Non-Invasive Vent Initial;Non-Invasive Vent Set Up  $ Face Mask Medium Yes  BiPAP/CPAP/SIPAP Pt Type Adult  BiPAP/CPAP/SIPAP SERVO  Mask Type Full face mask  Dentures removed? Yes - Given to RN  Mask Size Medium  Set Rate 18 breaths/min  Respiratory Rate 39 breaths/min  Pressure Support 6 cmH20  PEEP 6 cmH20  FiO2 (%) 60 %  Minute Ventilation 8.5  Leak 73  Peak Inspiratory Pressure (PIP) 12  Tidal Volume (Vt) 353  Patient Home Machine No  Patient Home Mask No  Patient Home Tubing No  Auto Titrate No  Press High Alarm 30 cmH2O  Press Low Alarm 5 cmH2O  CPAP/SIPAP surface wiped down Yes  Device Plugged into RED Power Outlet Yes  BiPAP/CPAP /SiPAP Vitals  Pulse Rate 99  Resp (!) 34  SpO2 (!) 89 %  MEWS Score/Color  MEWS Score 2  MEWS Score Color Yellow   RT placed PT on BIPAP 12/6 on 60% per ABG and MD order. PT is tolerating well and vitals are stable. RT will monitor.

## 2023-05-06 NOTE — Code Documentation (Signed)
 Levophed at 73mcg/min per Eloise Harman EDP at bedside

## 2023-05-06 NOTE — ED Provider Notes (Addendum)
  Physical Exam  BP 94/69   Pulse (!) 28   Resp (!) 28   Ht 5\' 11"  (1.803 m)   SpO2 99%   BMI 23.43 kg/m   Physical Exam  Procedures  Procedure Name: Intubation Date/Time: 05/06/2023 3:48 PM  Performed by: Rondel Baton, MDPre-anesthesia Checklist: Patient identified, Emergency Drugs available, Suction available and Timeout performed Oxygen Delivery Method: Ambu bag Preoxygenation: Pre-oxygenation with 100% oxygen Induction Type: IV induction and Rapid sequence Ventilation: Mask ventilation without difficulty Laryngoscope Size: Glidescope and 4 Grade View: Grade I Tube size: 7.5 mm Number of attempts: 1 Airway Equipment and Method: Video-laryngoscopy Placement Confirmation: ETT inserted through vocal cords under direct vision, Positive ETCO2 and CO2 detector Secured at: 23 cm Tube secured with: ETT holder Dental Injury: Teeth and Oropharynx as per pre-operative assessment     ED Course / MDM   Clinical Course as of 05/06/23 1543  Wed May 06, 2023  1504 Assumed care. 73 yo M with COPD on 3L has had worsening respiratory symptoms over the past few days. Now obtunded. Will need to intubate. No family around for code status confirmation.  [RP]  1542 Patient intubated.  Did have to push 2 A of bicarb.  Was started on Levophed and was bolused with fluids due to hypotension afterwards.  Vasopressin started.  Be started on propofol and fentanyl for sedation as well.  ICU was come down and place a central line in the patient. Dr Denese Killings at the bedside.  [RP]    Clinical Course User Index [RP] Rondel Baton, MD   Medical Decision Making Amount and/or Complexity of Data Reviewed Labs: ordered. Radiology: ordered.  Risk Prescription drug management. Decision regarding hospitalization.   CRITICAL CARE Performed by: Rondel Baton   Total critical care time: 45 minutes  Critical care time was exclusive of separately billable procedures and treating other  patients.  Critical care was necessary to treat or prevent imminent or life-threatening deterioration.  Critical care was time spent personally by me on the following activities: development of treatment plan with patient and/or surrogate as well as nursing, discussions with consultants, evaluation of patient's response to treatment, examination of patient, obtaining history from patient or surrogate, ordering and performing treatments and interventions, ordering and review of laboratory studies, ordering and review of radiographic studies, pulse oximetry and re-evaluation of patient's condition.     Rondel Baton, MD 05/06/23 [id number]

## 2023-05-06 NOTE — Progress Notes (Signed)
 Pt transported on vent to 2H10 from ED room 30.

## 2023-05-06 NOTE — H&P (Cosign Needed Addendum)
 NAME:  Johnny Rodriguez, MRN:  [id number], DOB:  30-Jun-1950, LOS: 0 ADMISSION DATE:  05/06/2023, CONSULTATION DATE:  05/06/23 REFERRING MD:  Dr. Eloise Harman, CHIEF COMPLAINT:  AECOPD    History of Present Illness:  Patient is a 73 year old man shortness a history of COPD on 3 L of oxygen at home, A-fib, HFrEF, hypertension who was brought to the ED by EMS due to respiratory distress. Majority my history was taken from chart review,  and Dr. Arlington Calix Notes, as patient was intubated at time of my visit. He presented with worsening shortness of breath, coughing with sputum production.  When EMS arrived, his sats were in the 70s, was put on BiPAP.  Continues to smoke.  At the ED, exam notable for bilateral wheezes with decreased air movement, and tachycardia. Labs at the ED notable for WBC 8.7, K5.4, SCr 2.27, BNP 920. AST 1255, ALT 979 Chest x-ray negative for acute infiltrate. EKG supraventricular tachycardia, pulse 160.  No ST elevation or T wave inversions.  Pertinent  Medical History  Atrial fibrillation on anticoagulation, HFrEF [EF 40 to 45%], hypertension, PE, COPD  Significant Hospital Events: Including procedures, antibiotic start and stop dates in addition to other pertinent events   3/26 : Sedated and intubated, transferred to the ICU.  Interim History / Subjective:  N/a  Objective   Blood pressure (!) 142/106, pulse 75, resp. rate (!) 27, SpO2 94%.    FiO2 (%):  [60 %] 60 % PEEP:  [6 cmH20] 6 cmH20 Pressure Support:  [6 cmH20] 6 cmH20  No intake or output data in the 24 hours ending 05/06/23 1509 There were no vitals filed for this visit.  Examination: General: Lying in bed, ETT tube in place. HENT: Normocephalic. Lungs: Mild wheezes, no crackles. Cardiovascular: Tachycardic, regular rhythm.  No JVD elevation. Abdomen: Protuberant abdomen.  Normal bowel sounds. Extremities:  cool, pale skin with hair loss.  No peripheral edema. Neuro: Not awake or alert, sedated  and intubated. GU: Foley in place.  Resolved Hospital Problem list   N/a  Assessment & Plan:   Acute on chronic hypoxic and hypercapnic respiratory failure. COPD exacerbation Productive cough, worsening dyspnea, initially was placed on BiPAP, but failed due to worsening VBG, and mental status.  Sedated and intubated, and transferred to the ICU.  Chest x-ray and respiratory viral panel negative.  -Continue on the vent -Wean off as tolerated, maintain oxygen between 88-92%  -DuoNebs, steroids. -Ceftriaxone and azithromycin. -Procalcitonin, discontinue antibiotics if normal.  Shock Acute on chronic permanent A-fib Elevated AST and ALT Likely cardiogenic shock in the setting of A-fib with RVR.   Etiology of acute A-fib likely in the setting of COPD exacerbation.  On labs, BNP elevated to 920, and evidence of hepatic congestion.  Mild troponinemia that has flattened.  Echocardiogram 3/25 LVEF 40-45% with decreased function and global hypokinesis, left atrium with mild to moderate dilation.  On labs, AST and ALT elevated, likely 2/2 to hepatic congestion.  Will repeat CMP, and if AST and ALT still elevated, consider getting hepatitis panel.  -Cardiac monitoring. -IV amiodarone loading dose. -IV Lasix 80 mg. -Strict I's and O's. -K >4, Mg >2. -RUQ ultrasound. -Could consider repeating echocardiogram. -Follow-up TSH -Target heart rate 60-80.  AKI Hyperkalemia SCr 2.27, ( baseline 1.01-1.16.), K5.4.  Etiology includes prerenal vs. intrarenal vs. postrenal.  Will get a bladder scan to rule out postrenal obstruction. AKI could also be 2/2 to cardiorenal syndrome.  -Strict I's and O's. -Renal ultrasound -UA -Diuresis as  above. -Avoid nephrotoxic drugs.  Best Practice (right click and "Reselect all SmartList Selections" daily)   Diet/type: NPO DVT prophylaxis: DOAC GI prophylaxis: PPI Lines: Central line Foley:  Yes, and it is still needed Code Status:  limited Last date of  multidisciplinary goals of care discussion [No]  Labs   CBC: Recent Labs  Lab 05/06/23 1400 05/06/23 1422 05/06/23 1504  WBC 8.7  --   --   NEUTROABS 6.1  --   --   HGB 15.1 15.3 16.0  HCT 48.6 45.0 47.0  MCV 100.6*  --   --   PLT 319  --   --     Basic Metabolic Panel: Recent Labs  Lab 05/06/23 1422 05/06/23 1504  NA 133* 134*  K 5.3* 5.6*   GFR: CrCl cannot be calculated (Patient's most recent lab result is older than the maximum 21 days allowed.). Recent Labs  Lab 05/06/23 1400  WBC 8.7    Liver Function Tests: No results for input(s): "AST", "ALT", "ALKPHOS", "BILITOT", "PROT", "ALBUMIN" in the last 168 hours. No results for input(s): "LIPASE", "AMYLASE" in the last 168 hours. No results for input(s): "AMMONIA" in the last 168 hours.  ABG    Component Value Date/Time   PHART 7.185 (LL) 05/06/2023 1422   PCO2ART 88.8 (HH) 05/06/2023 1422   PO2ART 54 (L) 05/06/2023 1422   HCO3 35.8 (H) 05/06/2023 1504   TCO2 39 (H) 05/06/2023 1504   ACIDBASEDEF 4.0 (H) 03/29/2017 1604   O2SAT 87 05/06/2023 1504     Coagulation Profile: No results for input(s): "INR", "PROTIME" in the last 168 hours.  Cardiac Enzymes: No results for input(s): "CKTOTAL", "CKMB", "CKMBINDEX", "TROPONINI" in the last 168 hours.  HbA1C: HbA1c, POC (prediabetic range)  Date/Time Value Ref Range Status  08/15/2022 01:34 PM 6.3 5.7 - 6.4 % Final   HbA1c, POC (controlled diabetic range)  Date/Time Value Ref Range Status  08/15/2022 03:08 PM 6.3 0.0 - 7.0 % Final    CBG: No results for input(s): "GLUCAP" in the last 168 hours.  Review of Systems:   A-fib, HFrEF, coronary artery disease, COPD on 3 L home oxygen.  Past Medical History:  He,  has a past medical history of AF (atrial fibrillation) (HCC), Alcohol abuse, Angina pectoris, CAD (coronary artery disease) (12/15/2004), COPD (chronic obstructive pulmonary disease) (HCC), Coronary artery disease, Heavy cigarette smoker, HTN  (hypertension), Idiopathic cardiomyopathy (HCC) (07/16/2009), Non compliance with medical treatment, and Seizures (HCC).   Surgical History:   Past Surgical History:  Procedure Laterality Date   CARDIAC CATHETERIZATION  2006   CARDIAC CATHETERIZATION  12/2004   Left    CARDIOVERSION  01/21/2011   Procedure: CARDIOVERSION;  Surgeon: Darden Palmer., MD;  Location: [facility] OR;  Service: Cardiovascular;  Laterality: N/A;   DOPPLER ECHOCARDIOGRAPHY  02/2011   ELECTROPHYSIOLOGY STUDY N/A 12/16/2011   Procedure: ELECTROPHYSIOLOGY STUDY;  Surgeon: Marinus Maw, MD;  Location: [facility] CATH LAB;  Service: Cardiovascular;  Laterality: N/A;   EP IMPLANTABLE DEVICE N/A 08/24/2015   Procedure: Loop Recorder Removal;  Surgeon: Marinus Maw, MD;  Location: [facility] INVASIVE CV LAB;  Service: Cardiovascular;  Laterality: N/A;   fx collar bone     NO PAST SURGERIES       Social History:   reports that he has been smoking cigarettes. He has a 58 pack-year smoking history. He has never used smokeless tobacco. He reports that he does not currently use alcohol. He reports current drug use. Drug: Marijuana.  Family History:  His family history includes COPD in his father; CVA in his mother; Coronary artery disease in his father; Heart disease in his father; Stroke in his mother; Suicidality in his sister.   Allergies No Known Allergies   Home Medications  Prior to Admission medications   Medication Sig Start Date End Date Taking? Authorizing Provider  albuterol (PROVENTIL) (2.5 MG/3ML) 0.083% nebulizer solution Take 3 mLs (2.5 mg total) by nebulization every 4 (four) hours as needed for wheezing or shortness of breath. 12/08/22   Ghimire, Werner Lean, MD  aspirin EC 81 MG tablet Take 1 tablet (81 mg total) by mouth daily. Swallow whole. 12/08/22   Ghimire, Werner Lean, MD  ELIQUIS 5 MG TABS tablet Take 1 tablet (5 mg total) by mouth 2 (two) times daily. 12/08/22   Ghimire, Werner Lean, MD  fluticasone furoate-vilanterol  (BREO ELLIPTA) 100-25 MCG/ACT AEPB Inhale 1 puff into the lungs daily. 12/08/22   Ghimire, Werner Lean, MD  folic acid (FOLVITE) 1 MG tablet Take 1 tablet (1 mg total) by mouth daily. 12/08/22   Ghimire, Werner Lean, MD  gabapentin (NEURONTIN) 400 MG capsule Take 1 capsule (400 mg total) by mouth 3 (three) times daily. 12/08/22   Ghimire, Werner Lean, MD  linaclotide Karlene Einstein) 145 MCG CAPS capsule Take 1 capsule (145 mcg total) by mouth daily before breakfast. 03/19/23 03/13/24  Napoleon Form, MD  metoprolol succinate (TOPROL-XL) 50 MG 24 hr tablet Take 1 tablet (50 mg total) by mouth daily. Take with or immediately following a meal. 12/08/22   Ghimire, Werner Lean, MD  OXYGEN Inhale 3 L into the lungs continuous.     [provider]  pantoprazole (PROTONIX) 40 MG tablet Take 1 tablet (40 mg total) by mouth daily as needed (acid reflux). 12/08/22   Ghimire, Werner Lean, MD  polyethylene glycol (MIRALAX / GLYCOLAX) 17 g packet Take 17 g by mouth daily. Patient taking differently: Take 17 g by mouth daily as needed for mild constipation. 03/15/19   Dellia Cloud, MD  predniSONE (DELTASONE) 10 MG tablet Take 40 mg daily for 1 day, 30 mg daily for 1 day, 20 mg daily for 1 days,10 mg daily for 1 day, then stop 12/08/22   Maretta Bees, MD  senna (SENOKOT) 8.6 MG TABS tablet Take 1-2 tablets (8.6-17.2 mg total) by mouth daily. Patient taking differently: Take 1-2 tablets by mouth daily as needed for moderate constipation. 03/15/19   Dellia Cloud, MD  SPIRIVA RESPIMAT 2.5 MCG/ACT AERS Inhale 2 puffs into the lungs daily. 12/08/22   Ghimire, Werner Lean, MD  traZODone (DESYREL) 50 MG tablet Take 1 tablet (50 mg total) by mouth at bedtime as needed for sleep. 12/08/22   Ghimire, Werner Lean, MD  VENTOLIN HFA 108 (90 Base) MCG/ACT inhaler INHALE 1-2 PUFFS INTO THE LUNGS EVERY 6 HOURS AS NEEDED FOR WHEEZING OR SHORTNESS OF BREATH 12/08/22   Ghimire, Werner Lean, MD         Laretta Bolster, MD [facility]  Internal Medicine Program - PGY-1 05/06/2023, 3:09 PM Pager# [phone]

## 2023-05-06 NOTE — ED Triage Notes (Signed)
 PT BIB EMS from home, hypoxic at home O2 sat 70's on RA.    5 Albuterol 0.5 Atrovent CPAP HR 130-180's

## 2023-05-06 NOTE — ED Provider Notes (Signed)
 [place] [facility] AT [facility] Provider Note   CSN: [id number] Arrival date & time: 05/06/23  1353     History  Chief Complaint  Patient presents with   Respiratory Distress    Johnny Rodriguez is a 73 y.o. male.  HPI Patient presents with shortness of breath.  History of COPD.  States for 4 days has been coughing with sputum production.  More short of breath.  EMS started on BiPAP.  Stated sats were in the 70s on his normal oxygen.  Unable to get IV access but given breathing treatment.  Still continues to smoke.  Does have history of atrial fibrillation.   Past Medical History:  Diagnosis Date   AF (atrial fibrillation) (HCC)    Alcohol abuse    Angina pectoris    CAD (coronary artery disease) 12/15/2004   COPD (chronic obstructive pulmonary disease) (HCC)    Coronary artery disease    Heavy cigarette smoker    HTN (hypertension)    Idiopathic cardiomyopathy (HCC) 07/16/2009   Non compliance with medical treatment    Seizures (HCC)     Home Medications Prior to Admission medications   Medication Sig Start Date End Date Taking? Authorizing Provider  albuterol (PROVENTIL) (2.5 MG/3ML) 0.083% nebulizer solution Take 3 mLs (2.5 mg total) by nebulization every 4 (four) hours as needed for wheezing or shortness of breath. 12/08/22   Ghimire, Werner Lean, MD  aspirin EC 81 MG tablet Take 1 tablet (81 mg total) by mouth daily. Swallow whole. 12/08/22   Ghimire, Werner Lean, MD  ELIQUIS 5 MG TABS tablet Take 1 tablet (5 mg total) by mouth 2 (two) times daily. 12/08/22   Ghimire, Werner Lean, MD  fluticasone furoate-vilanterol (BREO ELLIPTA) 100-25 MCG/ACT AEPB Inhale 1 puff into the lungs daily. 12/08/22   Ghimire, Werner Lean, MD  folic acid (FOLVITE) 1 MG tablet Take 1 tablet (1 mg total) by mouth daily. 12/08/22   Ghimire, Werner Lean, MD  gabapentin (NEURONTIN) 400 MG capsule Take 1 capsule (400 mg total) by mouth 3 (three) times daily. 12/08/22   Ghimire,  Werner Lean, MD  linaclotide Karlene Einstein) 145 MCG CAPS capsule Take 1 capsule (145 mcg total) by mouth daily before breakfast. 03/19/23 03/13/24  Napoleon Form, MD  metoprolol succinate (TOPROL-XL) 50 MG 24 hr tablet Take 1 tablet (50 mg total) by mouth daily. Take with or immediately following a meal. 12/08/22   Ghimire, Werner Lean, MD  OXYGEN Inhale 3 L into the lungs continuous.     [provider]  pantoprazole (PROTONIX) 40 MG tablet Take 1 tablet (40 mg total) by mouth daily as needed (acid reflux). 12/08/22   Ghimire, Werner Lean, MD  polyethylene glycol (MIRALAX / GLYCOLAX) 17 g packet Take 17 g by mouth daily. Patient taking differently: Take 17 g by mouth daily as needed for mild constipation. 03/15/19   Dellia Cloud, MD  predniSONE (DELTASONE) 10 MG tablet Take 40 mg daily for 1 day, 30 mg daily for 1 day, 20 mg daily for 1 days,10 mg daily for 1 day, then stop 12/08/22   Maretta Bees, MD  senna (SENOKOT) 8.6 MG TABS tablet Take 1-2 tablets (8.6-17.2 mg total) by mouth daily. Patient taking differently: Take 1-2 tablets by mouth daily as needed for moderate constipation. 03/15/19   Dellia Cloud, MD  SPIRIVA RESPIMAT 2.5 MCG/ACT AERS Inhale 2 puffs into the lungs daily. 12/08/22   Ghimire, Werner Lean, MD  traZODone (DESYREL) 50 MG  tablet Take 1 tablet (50 mg total) by mouth at bedtime as needed for sleep. 12/08/22   Ghimire, Werner Lean, MD  VENTOLIN HFA 108 (90 Base) MCG/ACT inhaler INHALE 1-2 PUFFS INTO THE LUNGS EVERY 6 HOURS AS NEEDED FOR WHEEZING OR SHORTNESS OF BREATH 12/08/22   Ghimire, Werner Lean, MD      Allergies    Patient has no known allergies.    Review of Systems   Review of Systems  Physical Exam Updated Vital Signs BP 94/69   Pulse (!) 28   Resp (!) 28   Ht 5\' 11"  (1.803 m)   SpO2 99%   BMI 23.43 kg/m  Physical Exam Vitals and nursing note reviewed.  HENT:     Head: Normocephalic.  Cardiovascular:     Rate and Rhythm: Tachycardia present. Rhythm  irregular.  Pulmonary:     Effort: Respiratory distress present.     Comments: Wheezes with decreased air movement. Abdominal:     Tenderness: There is no abdominal tenderness.  Musculoskeletal:     Cervical back: Neck supple.     Right lower leg: No edema.     Left lower leg: No edema.  Skin:    General: Skin is warm.     Capillary Refill: Capillary refill takes less than 2 seconds.  Neurological:     Mental Status: He is alert and oriented to person, place, and time.     ED Results / Procedures / Treatments   Labs (all labs ordered are listed, but only abnormal results are displayed) Labs Reviewed  COMPREHENSIVE METABOLIC PANEL - Abnormal; Notable for the following components:      Result Value   Potassium 5.4 (*)    Chloride 91 (*)    Glucose, Bld 152 (*)    BUN 55 (*)    Creatinine, Ser 2.27 (*)    AST 1,255 (*)    ALT 979 (*)    GFR, Estimated 30 (*)    All other components within normal limits  BRAIN NATRIURETIC PEPTIDE - Abnormal; Notable for the following components:   B Natriuretic Peptide 905.9 (*)    All other components within normal limits  CBC WITH DIFFERENTIAL/PLATELET - Abnormal; Notable for the following components:   MCV 100.6 (*)    Monocytes Absolute 1.2 (*)    Abs Immature Granulocytes 0.10 (*)    All other components within normal limits  I-STAT ARTERIAL BLOOD GAS, ED - Abnormal; Notable for the following components:   pH, Arterial 7.185 (*)    pCO2 arterial 88.8 (*)    pO2, Arterial 54 (*)    Bicarbonate 33.5 (*)    TCO2 36 (*)    Sodium 133 (*)    Potassium 5.3 (*)    All other components within normal limits  I-STAT VENOUS BLOOD GAS, ED - Abnormal; Notable for the following components:   pH, Ven 7.159 (*)    pCO2, Ven 100.6 (*)    pO2, Ven 71 (*)    Bicarbonate 35.8 (*)    TCO2 39 (*)    Acid-Base Excess 3.0 (*)    Sodium 134 (*)    Potassium 5.6 (*)    Calcium, Ion 1.10 (*)    All other components within normal limits  TROPONIN I  (HIGH SENSITIVITY) - Abnormal; Notable for the following components:   Troponin I (High Sensitivity) 24 (*)    All other components within normal limits  RESP PANEL BY RT-PCR (RSV, FLU A&B, COVID)  RVPGX2  CULTURE, RESPIRATORY W GRAM STAIN  RESPIRATORY PANEL BY PCR  CBC  CREATININE, SERUM  BRAIN NATRIURETIC PEPTIDE  HEMOGLOBIN A1C  MAGNESIUM  PHOSPHORUS  I-STAT CG4 LACTIC ACID, ED  TROPONIN I (HIGH SENSITIVITY)    EKG None  Radiology DG Chest Portable 1 View Result Date: 05/06/2023 CLINICAL DATA:  Hypoxia. EXAM: PORTABLE CHEST 1 VIEW COMPARISON:  December 05, 2022 FINDINGS: The heart size and mediastinal contours are within normal limits. There is mild to moderate severity calcification of the aortic arch. Lungs are hyperinflated. Chronic bronchitic changes are again seen. There is no evidence of acute infiltrate, pleural effusion or pneumothorax. The visualized skeletal structures are unremarkable. IMPRESSION: COPD without acute or active cardiopulmonary disease. Electronically Signed   By: Aram Candela M.D.   On: 05/06/2023 15:35    Procedures Procedures    Medications Ordered in ED Medications  ketamine 50 mg in normal saline 5 mL (10 mg/mL) syringe (has no administration in time range)  norepinephrine (LEVOPHED) 4-5 MG/250ML-% infusion SOLN (has no administration in time range)  sodium bicarbonate 1 mEq/mL injection (has no administration in time range)  sodium bicarbonate injection (50 mEq Intravenous Given 05/06/23 1521)  ketamine (KETALAR) injection (70 mg Intravenous Given 05/06/23 1512)  rocuronium (ZEMURON) injection (100 mg Intravenous Given 05/06/23 1513)  docusate (COLACE) 50 MG/5ML liquid 100 mg (has no administration in time range)  polyethylene glycol (MIRALAX / GLYCOLAX) packet 17 g (has no administration in time range)  enoxaparin (LOVENOX) injection 40 mg (has no administration in time range)  famotidine (PEPCID) tablet 20 mg (has no administration in time  range)  0.9 %  sodium chloride infusion (has no administration in time range)  ondansetron (ZOFRAN) injection 4 mg (has no administration in time range)  albuterol (PROVENTIL) (2.5 MG/3ML) 0.083% nebulizer solution 2.5 mg (has no administration in time range)  insulin aspart (novoLOG) injection 0-9 Units (has no administration in time range)  arformoterol (BROVANA) nebulizer solution 15 mcg (has no administration in time range)  budesonide (PULMICORT) nebulizer solution 0.25 mg (has no administration in time range)  revefenacin (YUPELRI) nebulizer solution 175 mcg (0 mcg Nebulization Hold 05/06/23 1539)  cefTRIAXone (ROCEPHIN) 2 g in sodium chloride 0.9 % 100 mL IVPB (has no administration in time range)  azithromycin (ZITHROMAX) 500 mg in sodium chloride 0.9 % 250 mL IVPB (has no administration in time range)  docusate (COLACE) 50 MG/5ML liquid 100 mg (has no administration in time range)  polyethylene glycol (MIRALAX / GLYCOLAX) packet 17 g (has no administration in time range)  fentaNYL (SUBLIMAZE) injection 25 mcg (has no administration in time range)  fentaNYL (SUBLIMAZE) injection 25-100 mcg (has no administration in time range)  QUEtiapine (SEROQUEL) tablet 50 mg (has no administration in time range)  methylPREDNISolone sodium succinate (SOLU-MEDROL) 40 mg/mL injection 40 mg (has no administration in time range)  feeding supplement (VITAL HIGH PROTEIN) liquid 1,000 mL (has no administration in time range)  feeding supplement (PROSource TF20) liquid 60 mL (has no administration in time range)  norepinephrine (LEVOPHED) 4mg  in (0.016 mg/mL) premix infusion (has no administration in time range)  vasopressin (PITRESSIN) 20 Units in 100 mL (0.2 unit/mL) infusion-*FOR SHOCK* (has no administration in time range)  fentaNYL 10 mcg/ml infusion (has no administration in time range)  propofol (DIPRIVAN) 1000 MG/100ML infusion (has no administration in time range)  methylPREDNISolone sodium  succinate (SOLU-MEDROL) 125 mg/2 mL injection 125 mg (125 mg Intravenous Given 05/06/23 1429)  albuterol (PROVENTIL) (2.5 MG/3ML) 0.083% nebulizer solution  5 mg (5 mg Nebulization Given 05/06/23 1429)  LORazepam (ATIVAN) injection 0.5 mg (0.5 mg Intravenous Given 05/06/23 1436)    ED Course/ Medical Decision Making/ A&P Clinical Course as of 05/06/23 1546  Wed May 06, 2023  1504 Assumed care. 73 yo M with COPD on 3L has had worsening respiratory symptoms over the past few days. Now obtunded. Will need to intubate. No family around for code status confirmation.  [RP]  1542 Patient intubated.  Did have to push 2 A of bicarb.  Was started on Levophed and was bolused with fluids due to hypotension afterwards.  Vasopressin started.  Be started on propofol and fentanyl for sedation as well.  ICU was come down and place a central line in the patient. Dr Denese Killings at the bedside.  [RP]    Clinical Course User Index [RP] Rondel Baton, MD                                 Medical Decision Making Amount and/or Complexity of Data Reviewed Labs: ordered. Radiology: ordered.  Risk Prescription drug management. Decision regarding hospitalization.   Patient shortness of breath and cough for 4 days.  Required recurrent suctioning by respiratory.  Differential diagnose includes COPD, pneumonia, viral infection.  However is still in respiratory distress.  Sats in the 80s.  Initially refused BiPAP but will give some Ativan and attempt.  ABG shows pH of 7.19 pCO2 of 88 with pO2 of 54.  Consider with respiratory acidosis with partial compensation.  Patient has allowed BiPAP.   CRITICAL CARE Performed by: Benjiman Core Total critical care time: 30 minutes Critical care time was exclusive of separately billable procedures and treating other patients. Critical care was necessary to treat or prevent imminent or life-threatening deterioration. Critical care was time spent personally by me on the  following activities: development of treatment plan with patient and/or surrogate as well as nursing, discussions with consultants, evaluation of patient's response to treatment, examination of patient, obtaining history from patient or surrogate, ordering and performing treatments and interventions, ordering and review of laboratory studies, ordering and review of radiographic studies, pulse oximetry and re-evaluation of patient's condition.   Patient became more somnolent.  Repeat gas showed worsening CO2.  Discussed with Dr. Denese Killings and decision made to intubate.  Patient appears to have been DNR but still would want intubation.         Final Clinical Impression(s) / ED Diagnoses Final diagnoses:  COPD exacerbation (HCC)  Acute respiratory failure with hypoxia and hypercapnia [facility])    Rx / DC Orders ED Discharge Orders     None         Benjiman Core, MD 05/06/23 1546

## 2023-05-07 DIAGNOSIS — J9622 Acute and chronic respiratory failure with hypercapnia: Secondary | ICD-10-CM

## 2023-05-07 DIAGNOSIS — R739 Hyperglycemia, unspecified: Secondary | ICD-10-CM

## 2023-05-07 DIAGNOSIS — J9621 Acute and chronic respiratory failure with hypoxia: Secondary | ICD-10-CM

## 2023-05-07 DIAGNOSIS — E440 Moderate protein-calorie malnutrition: Secondary | ICD-10-CM | POA: Insufficient documentation

## 2023-05-07 DIAGNOSIS — N179 Acute kidney failure, unspecified: Secondary | ICD-10-CM

## 2023-05-07 DIAGNOSIS — I4821 Permanent atrial fibrillation: Secondary | ICD-10-CM

## 2023-05-07 DIAGNOSIS — J449 Chronic obstructive pulmonary disease, unspecified: Secondary | ICD-10-CM | POA: Diagnosis not present

## 2023-05-07 DIAGNOSIS — R7401 Elevation of levels of liver transaminase levels: Secondary | ICD-10-CM

## 2023-05-07 LAB — GLUCOSE, CAPILLARY
Glucose-Capillary: 175 mg/dL — ABNORMAL HIGH (ref 70–99)
Glucose-Capillary: 176 mg/dL — ABNORMAL HIGH (ref 70–99)
Glucose-Capillary: 204 mg/dL — ABNORMAL HIGH (ref 70–99)
Glucose-Capillary: 210 mg/dL — ABNORMAL HIGH (ref 70–99)
Glucose-Capillary: 223 mg/dL — ABNORMAL HIGH (ref 70–99)
Glucose-Capillary: 255 mg/dL — ABNORMAL HIGH (ref 70–99)

## 2023-05-07 LAB — TRIGLYCERIDES: Triglycerides: 140 mg/dL (ref <150)

## 2023-05-07 LAB — BASIC METABOLIC PANEL WITH GFR
Anion gap: 14 (ref 5–15)
Anion gap: 15 (ref 5–15)
BUN: 49 mg/dL — ABNORMAL HIGH (ref 8–23)
BUN: 52 mg/dL — ABNORMAL HIGH (ref 8–23)
CO2: 31 mmol/L (ref 22–32)
CO2: 31 mmol/L (ref 22–32)
Calcium: 8.2 mg/dL — ABNORMAL LOW (ref 8.9–10.3)
Calcium: 8.4 mg/dL — ABNORMAL LOW (ref 8.9–10.3)
Chloride: 92 mmol/L — ABNORMAL LOW (ref 98–111)
Chloride: 93 mmol/L — ABNORMAL LOW (ref 98–111)
Creatinine, Ser: 1.62 mg/dL — ABNORMAL HIGH (ref 0.61–1.24)
Creatinine, Ser: 1.69 mg/dL — ABNORMAL HIGH (ref 0.61–1.24)
GFR, Estimated: 43 mL/min — ABNORMAL LOW (ref >60)
GFR, Estimated: 45 mL/min — ABNORMAL LOW (ref >60)
Glucose, Bld: 262 mg/dL — ABNORMAL HIGH (ref 70–99)
Glucose, Bld: 279 mg/dL — ABNORMAL HIGH (ref 70–99)
Potassium: 3.1 mmol/L — ABNORMAL LOW (ref 3.5–5.1)
Potassium: 3.5 mmol/L (ref 3.5–5.1)
Sodium: 137 mmol/L (ref 135–145)
Sodium: 139 mmol/L (ref 135–145)

## 2023-05-07 LAB — POCT I-STAT 7, (LYTES, BLD GAS, ICA,H+H)
Acid-Base Excess: 11 mmol/L — ABNORMAL HIGH (ref 0.0–2.0)
Acid-Base Excess: 12 mmol/L — ABNORMAL HIGH (ref 0.0–2.0)
Bicarbonate: 36.6 mmol/L — ABNORMAL HIGH (ref 20.0–28.0)
Bicarbonate: 37.1 mmol/L — ABNORMAL HIGH (ref 20.0–28.0)
Calcium, Ion: 1.12 mmol/L — ABNORMAL LOW (ref 1.15–1.40)
Calcium, Ion: 1.13 mmol/L — ABNORMAL LOW (ref 1.15–1.40)
HCT: 40 % (ref 39.0–52.0)
HCT: 43 % (ref 39.0–52.0)
Hemoglobin: 13.6 g/dL (ref 13.0–17.0)
Hemoglobin: 14.6 g/dL (ref 13.0–17.0)
O2 Saturation: 94 %
O2 Saturation: 97 %
Patient temperature: 97.7
Patient temperature: 97.7
Potassium: 3.4 mmol/L — ABNORMAL LOW (ref 3.5–5.1)
Potassium: 3.5 mmol/L (ref 3.5–5.1)
Sodium: 133 mmol/L — ABNORMAL LOW (ref 135–145)
Sodium: 134 mmol/L — ABNORMAL LOW (ref 135–145)
TCO2: 38 mmol/L — ABNORMAL HIGH (ref 22–32)
TCO2: 39 mmol/L — ABNORMAL HIGH (ref 22–32)
pCO2 arterial: 46.2 mmHg (ref 32–48)
pCO2 arterial: 49.4 mmHg — ABNORMAL HIGH (ref 32–48)
pH, Arterial: 7.482 — ABNORMAL HIGH (ref 7.35–7.45)
pH, Arterial: 7.505 — ABNORMAL HIGH (ref 7.35–7.45)
pO2, Arterial: 66 mmHg — ABNORMAL LOW (ref 83–108)
pO2, Arterial: 83 mmHg (ref 83–108)

## 2023-05-07 LAB — COMPREHENSIVE METABOLIC PANEL WITH GFR
ALT: 517 U/L — ABNORMAL HIGH (ref 0–44)
AST: 166 U/L — ABNORMAL HIGH (ref 15–41)
Albumin: 2.4 g/dL — ABNORMAL LOW (ref 3.5–5.0)
Alkaline Phosphatase: 52 U/L (ref 38–126)
Anion gap: 10 (ref 5–15)
BUN: 49 mg/dL — ABNORMAL HIGH (ref 8–23)
CO2: 34 mmol/L — ABNORMAL HIGH (ref 22–32)
Calcium: 8.4 mg/dL — ABNORMAL LOW (ref 8.9–10.3)
Chloride: 94 mmol/L — ABNORMAL LOW (ref 98–111)
Creatinine, Ser: 1.23 mg/dL (ref 0.61–1.24)
GFR, Estimated: >60 mL/min (ref >60)
Glucose, Bld: 193 mg/dL — ABNORMAL HIGH (ref 70–99)
Potassium: 4.2 mmol/L (ref 3.5–5.1)
Sodium: 138 mmol/L (ref 135–145)
Total Bilirubin: 0.7 mg/dL (ref 0.0–1.2)
Total Protein: 6.3 g/dL — ABNORMAL LOW (ref 6.5–8.1)

## 2023-05-07 LAB — MAGNESIUM
Magnesium: 1.8 mg/dL (ref 1.7–2.4)
Magnesium: 2.5 mg/dL — ABNORMAL HIGH (ref 1.7–2.4)
Magnesium: 2.8 mg/dL — ABNORMAL HIGH (ref 1.7–2.4)

## 2023-05-07 LAB — CBC
HCT: 39.8 % (ref 39.0–52.0)
Hemoglobin: 13.3 g/dL (ref 13.0–17.0)
MCH: 31.6 pg (ref 26.0–34.0)
MCHC: 33.4 g/dL (ref 30.0–36.0)
MCV: 94.5 fL (ref 80.0–100.0)
Platelets: 297 10*3/uL (ref 150–400)
RBC: 4.21 MIL/uL — ABNORMAL LOW (ref 4.22–5.81)
RDW: 13.4 % (ref 11.5–15.5)
WBC: 9.6 10*3/uL (ref 4.0–10.5)
nRBC: 0 % (ref 0.0–0.2)

## 2023-05-07 LAB — HEPATIC FUNCTION PANEL
ALT: 673 U/L — ABNORMAL HIGH (ref 0–44)
AST: 387 U/L — ABNORMAL HIGH (ref 15–41)
Albumin: 2.6 g/dL — ABNORMAL LOW (ref 3.5–5.0)
Alkaline Phosphatase: 58 U/L (ref 38–126)
Bilirubin, Direct: 0.3 mg/dL — ABNORMAL HIGH (ref 0.0–0.2)
Indirect Bilirubin: 0.4 mg/dL (ref 0.3–0.9)
Total Bilirubin: 0.7 mg/dL (ref 0.0–1.2)
Total Protein: 6.5 g/dL (ref 6.5–8.1)

## 2023-05-07 LAB — PHOSPHORUS
Phosphorus: 1.8 mg/dL — ABNORMAL LOW (ref 2.5–4.6)
Phosphorus: 3.3 mg/dL (ref 2.5–4.6)
Phosphorus: 3.4 mg/dL (ref 2.5–4.6)

## 2023-05-07 LAB — PROCALCITONIN: Procalcitonin: 0.39 ng/mL

## 2023-05-07 MED ORDER — VITAL 1.5 CAL PO LIQD: 1000.0000 mL | ORAL | Status: DC

## 2023-05-07 MED ORDER — POTASSIUM CHLORIDE 20 MEQ PO PACK
40.0000 meq | PACK | Freq: Once | ORAL | Status: AC
  Administered 2023-05-07: 40 meq
  Filled 2023-05-07: qty 2

## 2023-05-07 MED ORDER — INSULIN ASPART 100 UNIT/ML IJ SOLN
0.0000 [IU] | INTRAMUSCULAR | Status: DC
  Administered 2023-05-07: 3 [IU] via SUBCUTANEOUS
  Administered 2023-05-07: 4 [IU] via SUBCUTANEOUS
  Administered 2023-05-07 – 2023-05-08 (×2): 7 [IU] via SUBCUTANEOUS
  Administered 2023-05-08 (×2): 4 [IU] via SUBCUTANEOUS
  Administered 2023-05-09 (×2): 3 [IU] via SUBCUTANEOUS

## 2023-05-07 MED ORDER — POTASSIUM PHOSPHATES 15 MMOLE/5ML IV SOLN
30.0000 mmol | Freq: Once | INTRAVENOUS | Status: AC
  Administered 2023-05-07: 30 mmol via INTRAVENOUS
  Filled 2023-05-07: qty 10

## 2023-05-07 MED ORDER — ALBUTEROL SULFATE (2.5 MG/3ML) 0.083% IN NEBU
2.5000 mg | INHALATION_SOLUTION | RESPIRATORY_TRACT | Status: DC | PRN
  Administered 2023-05-08: 2.5 mg via RESPIRATORY_TRACT
  Filled 2023-05-07: qty 3

## 2023-05-07 MED ORDER — INSULIN GLARGINE-YFGN 100 UNIT/ML ~~LOC~~ SOLN
5.0000 [IU] | Freq: Every day | SUBCUTANEOUS | Status: DC
  Administered 2023-05-07 – 2023-05-08 (×2): 5 [IU] via SUBCUTANEOUS
  Filled 2023-05-07 (×3): qty 0.05

## 2023-05-07 MED ORDER — IPRATROPIUM-ALBUTEROL 0.5-2.5 (3) MG/3ML IN SOLN: 3.0000 mL | RESPIRATORY_TRACT | Status: DC | PRN

## 2023-05-07 MED ORDER — SODIUM CHLORIDE 0.9 % IV SOLN
2.0000 g | Freq: Two times a day (BID) | INTRAVENOUS | Status: DC
  Administered 2023-05-07 – 2023-05-09 (×4): 2 g via INTRAVENOUS
  Filled 2023-05-07 (×4): qty 12.5

## 2023-05-07 MED ORDER — POTASSIUM CHLORIDE 20 MEQ PO PACK
20.0000 meq | PACK | Freq: Once | ORAL | Status: AC
  Administered 2023-05-07: 20 meq
  Filled 2023-05-07: qty 1

## 2023-05-07 MED ORDER — PROSOURCE TF20 ENFIT COMPATIBL EN LIQD
60.0000 mL | Freq: Two times a day (BID) | ENTERAL | Status: DC
  Administered 2023-05-07 – 2023-05-08 (×2): 60 mL
  Filled 2023-05-07 (×2): qty 60

## 2023-05-07 MED ORDER — MAGNESIUM SULFATE 2 GM/50ML IV SOLN
2.0000 g | Freq: Once | INTRAVENOUS | Status: AC
  Administered 2023-05-07: 2 g via INTRAVENOUS
  Filled 2023-05-07: qty 50

## 2023-05-07 MED ORDER — THIAMINE MONONITRATE 100 MG PO TABS
100.0000 mg | ORAL_TABLET | Freq: Every day | ORAL | Status: DC
  Administered 2023-05-07 – 2023-05-08 (×2): 100 mg
  Filled 2023-05-07 (×2): qty 1

## 2023-05-07 MED ORDER — VITAL 1.5 CAL PO LIQD
1000.0000 mL | ORAL | Status: DC
  Administered 2023-05-07: 1000 mL

## 2023-05-07 NOTE — Progress Notes (Signed)
 [facility] ADULT ICU REPLACEMENT PROTOCOL   The patient does apply for the [facility] Adult ICU Electrolyte Replacment Protocol based on the criteria listed below:   1.Exclusion criteria: TCTS, ECMO, Dialysis, and Myasthenia Gravis patients 2. Is GFR >/= 30 ml/min? Yes.    Patient's GFR today is 43 3. Is SCr </= 2? Yes.   Patient's SCr is 1.69 mg/dL 4. Did SCr increase >/= 0.5 in 24 hours? No. 5.Pt's weight >40kg  Yes.   6. Abnormal electrolyte(s): potassium 3.1, mag 1.8, phos 1.8  7. Electrolytes replaced per protocol 8.  Call MD STAT for K+ </= 2.5, Phos </= 1, or Mag </= 1 Physician:  protocol  Melvern Banker 05/07/2023 12:54 AM

## 2023-05-07 NOTE — Progress Notes (Signed)
 NAME:  Johnny Rodriguez, MRN:  [id number], DOB:  12/23/1950, LOS: 1 ADMISSION DATE:  05/06/2023, CONSULTATION DATE:  05/06/2023 REFERRING MD: Eloise Harman - EDP, CHIEF COMPLAINT:  AECOPD    History of Present Illness:  73 year old man who presented to [facility] 3/26 for SOB, respiratory distress. PMHx significant for HTN, PAF, HFrEF (Echo 04/2023 with EF 40-45%), COPD with chronic hypoxic respiratory failure (on 3L O2 at home).  Patient initially presented to the ED with worsening SOB and cough with sputum production. On EMS arrival, patient's sats were in the 70s, was put on BiPAP.  Continues to smoke.  At the ED, exam notable for bilateral wheezes with decreased air movement, and tachycardia. Labs were notable for WBC 8.7, K5.4, SCr 2.27, BNP 920. AST 1255, ALT 979 CXR negative for acute findings. EKG demonstrated supraventricular tachycardia (rate 160) without ST elevation or T wave inversions.  Pertinent Medical History:  Atrial fibrillation on anticoagulation, HFrEF [EF 40 to 45%], hypertension, PE, COPD  Significant Hospital Events: Including procedures, antibiotic start and stop dates in addition to other pertinent events   3/26 - Sedated and intubated, transferred to the ICU.  Interim History / Subjective:  No significant events overnight Following commands intermittently Remains on Fent/Prop Levo  Objective:  Blood pressure 137/87, pulse (!) 56, temperature (!) 97 F (36.1 C), temperature source Axillary, resp. rate (!) 22, height 5\' 11"  (1.803 m), weight 76 kg, SpO2 99%. CVP:  [0 mmHg-14 mmHg] 10 mmHg  Vent Mode: PRVC FiO2 (%):  [40 %-100 %] 40 % Set Rate:  [22 bmp-28 bmp] 22 bmp Vt Set:  [600 mL] 600 mL PEEP:  [5 cmH20-6 cmH20] 5 cmH20 Pressure Support:  [6 cmH20] 6 cmH20 Plateau Pressure:  [17 cmH20-18 cmH20] 17 cmH20   Intake/Output Summary (Last 24 hours) at 05/07/2023 1113 Last data filed at 05/07/2023 0800 Gross per 24 hour  Intake 1961.08 ml  Output 2695 ml  Net  -733.92 ml   Filed Weights   05/07/23 0421  Weight: 76 kg   Physical Examination: General: Acutely ill-appearing older man in NAD. HEENT: [place]/AT, anicteric sclera, PERRL 2mm sluggish, moist mucous membranes. Neuro: Sedated. Responds to noxious stimuli. Following commands intermittently. Intermittent spontaneous movement of all 4 extremities noted. +Cough and +Gag  CV: Mildly bradycardic, irregularly irregular rhythm, no m/g/r. PULM: Breathing even and unlabored on vent (PEEP 5, FIO2 40%). Lung fields clear throughout with occasional wheeze. Mild to moderate yellow secretions in [place] tubing. GI: Soft, nontender, nondistended. Normoactive bowel sounds. Extremities: No LE edema noted. Skin: Warm/dry, no rashes; mild chronic-appearing blanching erythema of bilateral feet.  Resolved Hospital Problem List:     Assessment & Plan:   Acute-on-chronic hypoxic and hypercapnic respiratory failure, on 3L HOT AECOPD Productive cough, worsening dyspnea, initially was placed on BiPAP, but failed due to worsening VBG, and mental status.  Sedated and intubated, and transferred to the ICU.  Chest x-ray and respiratory viral panel negative. - Continue full vent support (4-8cc/kg IBW) - Wean FiO2 for O2 sat 88-92% - Daily WUA/SBT as appropriate from a vent requirement standpoint - Bronchodilators (Pulmicort, Brovana/Yupelri, albuterol) - Continue Solumedrol BID, taper as indicated - VAP bundle - Pulmonary hygiene - PAD protocol for sedation: Propofol and Fentanyl for goal RASS 0 to -1 - PNA coverage with cefepime/azithromycin; monitor QTc intermittently - COVID/Flu/RSV and eRVP negative - Resp Cx with mod GNRs, few GPCs   Shock, presumed in the setting of AECOPD, ?cardiogenic component - Goal MAP > 65 -  Levophed titrated to goal MAP - Trend WBC, fever curve - F/u Cx data - Continue broad-spectrum antibiotics (as above)  Permanent A-fib with RVR (on Eliquis) HFrEF Likely cardiogenic shock in  the setting of A-fib with RVR.   Etiology of acute A-fib likely in the setting of COPD exacerbation.  On labs, BNP elevated to 920, and evidence of hepatic congestion.  Mild troponinemia that has flattened.  Echo 3/25 with LVEF 40-45% with decreased function and global hypokinesis, left atrium with mild to moderate dilation.  - Cardiac monitoring - Amiodarone gtt - Optimize electrolytes for K > 4, Mg > 2 - Lovenox for AC (prophylactic dose for now) - TSH WNL  AKI Hyperkalemia SCr 2.27, ( baseline 1.01-1.16.), K5.4.  Etiology includes prerenal vs. intrarenal vs. postrenal. - Trend BMP - Replete electrolytes as indicated - Monitor I&Os - Avoid nephrotoxic agents as able - Ensure adequate renal perfusion  Transaminitis, ?2/2 hepatic congestion, improving - Trend LFTs to normal - Defer acute hepatitis panel, as LFTs improving  Steroid-induced hyperglycemia - SSI, resistant scale - CBGs Q4H - Goal CBG 140-180  Best Practice (right click and "Reselect all SmartList Selections" daily)   Diet/type: NPO DVT prophylaxis: LMWH, SCDs GI prophylaxis: PPI Lines: Central line Foley:  Yes, and it is still needed Code Status:  limited Last date of multidisciplinary goals of care discussion: [Pending]  Critical care time:   The patient is critically ill with multiple organ system failure and requires high complexity decision making for assessment and support, frequent evaluation and titration of therapies, advanced monitoring, review of radiographic studies and interpretation of complex data.   Critical Care Time devoted to patient care services, exclusive of separately billable procedures, described in this note is 36 minutes.  Tim Lair, PA-C [facility] 05/07/23 11:14 AM  Please see [email or web address] for pager details.  From 7A-7P if no response, please call [phone] After hours, please call ELink [phone]

## 2023-05-07 NOTE — Inpatient Diabetes Management (Signed)
 Inpatient Diabetes Program Recommendations  AACE/ADA: New Consensus Statement on Inpatient Glycemic Control (2015)  Target Ranges:  Prepandial:   less than 140 mg/dL      Peak postprandial:   less than 180 mg/dL (1-2 hours)      Critically ill patients:  140 - 180 mg/dL   Lab Results  Component Value Date   GLUCAP 210 (H) 05/07/2023   HGBA1C 6.1 (H) 05/06/2023    Review of Glycemic Control  Diabetes history: No hx DM Current orders for Inpatient glycemic control: Novolog 0-9 units q 4 hrs., Solumedrol 40 mg q 12 hrs.  Solumedrol 125 mg yesterday @ 1429 pm  Inpatient Diabetes Program Recommendations:   When tube feedings started, will need tube feed coverage q 4 hrs. Steroids have decreased to 40 mg q 12 hrs. Will follow during hospitalization.  Thank you, Johnny Rodriguez. Johnny Wanless, RN, MSN, CDCES  Diabetes Coordinator Inpatient Glycemic Control Team Team Pager [phone] (8am-5pm) 05/07/2023 12:18 PM

## 2023-05-07 NOTE — Progress Notes (Signed)
 Pharmacy Antibiotic Note  Johnny Rodriguez is a 73 y.o. male with pneumonia.  Pharmacy has been consulted for cefepime dosing.  -CrCl ~ 40 -Trach cultures with GNR, GPC/pairs   Plan: -Cefepime 2gm IV q12h -Will follow renal function, cultures and clinical progress   Height: 5\' 11"  (180.3 cm) Weight: 76 kg (167 lb 8.8 oz) IBW/kg (Calculated) : 75.3  Temp (24hrs), Avg:97.4 F (36.3 C), Min:96.4 F (35.8 C), Max:98.7 F (37.1 C)  Recent Labs  Lab 05/06/23 1400 05/06/23 1714 05/06/23 1907 05/07/23 0006 05/07/23 0420  WBC 8.7 10.0  --   --  9.6  CREATININE 2.27* 1.98*  --  1.69* 1.62*  LATICACIDVEN  --   --  4.1*  --   --     Estimated Creatinine Clearance: 44.5 mL/min (A) (by C-G formula based on SCr of 1.62 mg/dL (H)).    No Known Allergies  Antimicrobials this admission: 3/26 azith>> 3/30 3/26 CTX>> 3/27 3/27 cefepime>>  Dose adjustments this admission:   Microbiology results: 3/26 trach- GNR, GPC/pairs   Thank you for allowing pharmacy to be a part of this patient's care.  Harland German, PharmD Clinical Pharmacist **Pharmacist phone directory can now be found on [email or web address] ([email or web address]).  Listed under [facility].

## 2023-05-07 NOTE — Progress Notes (Signed)
 Vent resp rate changed to 16 per MD request Dr Chestine Spore in room discussion.  Pt appears to tol well.  RN aware.

## 2023-05-07 NOTE — Progress Notes (Signed)
 Initial Nutrition Assessment  DOCUMENTATION CODES:   Non-severe (moderate) malnutrition in context of chronic illness, Not applicable  INTERVENTION:   Tube Feeding via OG:  Vital 1.5 at 50 ml/hr Begin TF at rate of 20 ml/hr; titrate by 10 mL q 8 hours until goal rate of 50 ml/hr Pro-Source TF20 60 mL BID TF at goal provides 1960 kcals, 121 g of protein and 912 mL of free water  Monitor magnesium, potassium, and phosphorus BID for at least 3 days, MD to replete as needed, as pt is at risk for refeeding syndrome.  Add Thiamine 100 mg daily  NUTRITION DIAGNOSIS:   Moderate Malnutrition related to chronic illness as evidenced by severe muscle depletion, moderate muscle depletion, moderate fat depletion.  GOAL:   Patient will meet greater than or equal to 90% of their needs   MONITOR:   Vent status, Labs, Weight trends, TF tolerance, Skin  REASON FOR ASSESSMENT:   Consult, Ventilator Enteral/tube feeding initiation and management  ASSESSMENT:   73 yo male admitted with respiratory distress with acute on chronic respiratory failure with COPD exacerbation requiring intubation, shock-likely cardiogenic, AKI. PMH includes COPD on 3L at home, HFrEF, HTN, current smoker  (tobacco)  3/26 Admitted, Intubated. US abdomen: Trace free fluid around the liver edge. No evidence of cholelithiasis or acute cholecystitis. Renal US wit normal kidneys  Pt remains on vent support, OG tube in stomach per abd xray Propofol: 8.4 ml/hr (222 kcals) Levophed down to 3  Adult TF protocol ordered (Vital HP at 40 ml/hr) yesterday by MD but TF not yet started.   Hx of EtOH abuse but reports no current use per H&P Elevated LFTs, per MD notes likely secondary to hepatic congestion   Weight this AM 76 kg. Unable to obtain diet and weight history from patient at this time  Labs: CBGs 210-272 Sodium 137 (wdl) Potassium 3.5 (wdl) Phosphorus 3.3 (wdl-after supplementation of phosphorus 1.8) TG  140 AST 387/ALT 673   Meds:  Colace  Miralax SS novolog Solumedrol KCl  NUTRITION - FOCUSED PHYSICAL EXAM:  Flowsheet Row Most Recent Value  Orbital Region Moderate depletion  Upper Arm Region Moderate depletion  Thoracic and Lumbar Region Moderate depletion  Buccal Region Unable to assess  Temple Region Moderate depletion  Clavicle Bone Region Moderate depletion  Clavicle and Acromion Bone Region Moderate depletion  Scapular Bone Region Moderate depletion  Dorsal Hand Moderate depletion  Patellar Region Severe depletion  Anterior Thigh Region Severe depletion  Posterior Calf Region Severe depletion  Edema (RD Assessment) Mild       Diet Order:   Diet Order             Diet NPO time specified  Diet effective now                   EDUCATION NEEDS:   Not appropriate for education at this time  Skin:  Skin Assessment: Reviewed RN Assessment  Last BM:  PTA, abd distended but soft  Height:   Ht Readings from Last 1 Encounters:  05/06/23 5\' 11"  (1.803 m)    Weight:   Wt Readings from Last 1 Encounters:  05/07/23 76 kg     BMI:  Body mass index is 23.37 kg/m.  Estimated Nutritional Needs:   Kcal:  1900-2100 kcals  Protein:  110-125 g  Fluid:  1.9 L    Romelle Starcher MS, RDN, LDN, CNSC Registered Dietitian 3 Clinical Nutrition RD Inpatient Contact Info in Amion

## 2023-05-07 NOTE — TOC Initial Note (Signed)
 Transition of Care [facility]) - Initial/Assessment Note    Patient Details  Name: Johnny Rodriguez MRN: [id number] Date of Birth: 05-23-1950  [facility] CM/SW Contact:    Elliot Cousin, RN Phone Number: [phone] 05/07/2023, 5:04 PM  Clinical Narrative:                 Reviewed chart.  Attempted to contact family and no emergency contact available.   Will continue to follow for dc needs.   Expected Discharge Plan: Skilled Nursing Facility     Patient Goals and CMS Choice            Expected Discharge Plan and Services                                              Prior Living Arrangements/Services                       Activities of Daily Living      Permission Sought/Granted                  Emotional Assessment   Attitude/Demeanor/Rapport: Intubated (Following Commands or Not Following Commands)          Admission diagnosis:  COPD exacerbation (HCC) [J44.1] Respiratory failure with hypoxia (HCC) [J96.91] Acute respiratory failure with hypoxia and hypercapnia (HCC) [J96.01, J96.02] Patient Active Problem List   Diagnosis Date Noted   Malnutrition of moderate degree 05/07/2023   Respiratory failure with hypoxia (HCC) 05/06/2023   Prediabetes 08/15/2022   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   Pulmonary embolism (HCC) 03/27/2022   Dyspnea secondary to COPD 03/26/2022   Hypocalcemia    Hypokalemia    Atrial fibrillation with RVR (HCC) 06/05/2020   COPD with acute exacerbation (HCC) 03/11/2019   AKI (acute kidney injury) (HCC) 03/10/2019   Bowel obstruction (HCC) 03/10/2019   Chronic respiratory failure with hypoxia (HCC) 10/15/2016   Cigarette smoker 09/09/2016   Long term current use of anticoagulant therapy 06/24/2016   History of syncope 06/24/2016   Acute on chronic respiratory failure with hypoxia and hypercapnia (HCC) 02/16/2016   COPD exacerbation (HCC) 02/08/2015   Essential hypertension 02/08/2015    Syncope 12/10/2011   History of noncompliance with medical treatment 12/01/2011   Chronic systolic heart failure (HCC) 04/10/2011   Idiopathic cardiomyopathy (HCC) 07/16/2009   Paroxysmal atrial fibrillation (HCC) 07/16/2009   COPD PFT's pending  07/16/2009   CAD (coronary artery disease) 12/15/2004   PCP:  Rema Fendt, NP Pharmacy:   [facility] - [place], [place] - [address] [place] [address] Phone: [phone] Fax: [phone]  [facility] [address] [place] [address] Phone: [phone] Fax: [phone]  [id number] [place], [place] - [address] [place] [address] Phone: [phone] Fax: [phone]  [facility] [place], [place] - [address] [place] [id number] Phone: [phone] Fax: [phone]  [id number] - [address] ([place]), [place] - [address] [place] [address] Phone: [phone] Fax: [phone]     Social Drivers of Health (SDOH) Social History: SDOH Screenings   Food Insecurity: Patient Unable To Answer (05/06/2023)  Housing: Patient Unable To Answer (05/06/2023)  Transportation Needs: Patient Unable  To Answer (05/06/2023)  Utilities: Patient Unable To Answer (05/06/2023)  Alcohol Screen: Low Risk  (09/11/2020)  Recent Concern: Alcohol Screen - Medium Risk (08/07/2020)  Depression (PHQ2-9): Low Risk  (11/21/2022)  Financial Resource Strain: Low Risk  (11/21/2022)  Physical Activity: Inactive (11/21/2022)  Social Connections: Patient Unable To Answer (05/06/2023)  Stress: No Stress Concern Present (11/21/2022)  Tobacco Use: High Risk (05/06/2023)  Health Literacy: Adequate Health Literacy (11/21/2022)   SDOH Interventions:     Readmission Risk Interventions     No data to display

## 2023-05-07 NOTE — Progress Notes (Signed)
 ABG redone and submitted at 0407 due to the previous abg being a mixed sample. No issues at this time.

## 2023-05-07 NOTE — Plan of Care (Signed)
  Problem: Education: Goal: Knowledge of General Education information will improve Description: Including pain rating scale, medication(s)/side effects and non-pharmacologic comfort measures Outcome: Progressing   Problem: Health Behavior/Discharge Planning: Goal: Ability to manage health-related needs will improve Outcome: Progressing   Problem: Clinical Measurements: Goal: Ability to maintain clinical measurements within normal limits will improve 05/07/2023 1635 by Darlina Sicilian, RN Outcome: Progressing 05/07/2023 1633 by Darlina Sicilian, RN Outcome: Progressing Goal: Will remain free from infection 05/07/2023 1635 by Darlina Sicilian, RN Outcome: Progressing 05/07/2023 1633 by Darlina Sicilian, RN Outcome: Progressing Goal: Diagnostic test results will improve 05/07/2023 1635 by Darlina Sicilian, RN Outcome: Progressing 05/07/2023 1633 by Darlina Sicilian, RN Outcome: Progressing Goal: Respiratory complications will improve 05/07/2023 1635 by Darlina Sicilian, RN Outcome: Progressing 05/07/2023 1633 by Darlina Sicilian, RN Outcome: Progressing Goal: Cardiovascular complication will be avoided 05/07/2023 1635 by Darlina Sicilian, RN Outcome: Progressing 05/07/2023 1633 by Darlina Sicilian, RN Outcome: Progressing   Problem: Activity: Goal: Risk for activity intolerance will decrease Outcome: Progressing   Problem: Nutrition: Goal: Adequate nutrition will be maintained Outcome: Progressing   Problem: Coping: Goal: Level of anxiety will decrease Outcome: Progressing   Problem: Elimination: Goal: Will not experience complications related to bowel motility Outcome: Progressing Goal: Will not experience complications related to urinary retention Outcome: Progressing   Problem: Pain Managment: Goal: General experience of comfort will improve and/or be controlled Outcome: Progressing   Problem: Safety: Goal: Ability to remain free from injury will  improve Outcome: Progressing   Problem: Skin Integrity: Goal: Risk for impaired skin integrity will decrease Outcome: Progressing   Problem: Education: Goal: Ability to describe self-care measures that may prevent or decrease complications (Diabetes Survival Skills Education) will improve Outcome: Progressing   Problem: Coping: Goal: Ability to adjust to condition or change in health will improve Outcome: Progressing   Problem: Fluid Volume: Goal: Ability to maintain a balanced intake and output will improve Outcome: Progressing   Problem: Health Behavior/Discharge Planning: Goal: Ability to identify and utilize available resources and services will improve Outcome: Progressing Goal: Ability to manage health-related needs will improve Outcome: Progressing   Problem: Metabolic: Goal: Ability to maintain appropriate glucose levels will improve Outcome: Progressing   Problem: Nutritional: Goal: Maintenance of adequate nutrition will improve Outcome: Progressing Goal: Progress toward achieving an optimal weight will improve Outcome: Progressing   Problem: Skin Integrity: Goal: Risk for impaired skin integrity will decrease Outcome: Progressing   Problem: Tissue Perfusion: Goal: Adequacy of tissue perfusion will improve Outcome: Progressing

## 2023-05-08 ENCOUNTER — Inpatient Hospital Stay (HOSPITAL_COMMUNITY)

## 2023-05-08 ENCOUNTER — Ambulatory Visit: Payer: Medicare HMO | Admitting: Student

## 2023-05-08 DIAGNOSIS — J441 Chronic obstructive pulmonary disease with (acute) exacerbation: Secondary | ICD-10-CM

## 2023-05-08 DIAGNOSIS — J9622 Acute and chronic respiratory failure with hypercapnia: Secondary | ICD-10-CM | POA: Diagnosis not present

## 2023-05-08 DIAGNOSIS — J9621 Acute and chronic respiratory failure with hypoxia: Secondary | ICD-10-CM | POA: Diagnosis not present

## 2023-05-08 DIAGNOSIS — E875 Hyperkalemia: Secondary | ICD-10-CM

## 2023-05-08 DIAGNOSIS — R579 Shock, unspecified: Secondary | ICD-10-CM | POA: Diagnosis not present

## 2023-05-08 LAB — GLUCOSE, CAPILLARY
Glucose-Capillary: 169 mg/dL — ABNORMAL HIGH (ref 70–99)
Glucose-Capillary: 203 mg/dL — ABNORMAL HIGH (ref 70–99)
Glucose-Capillary: 170 mg/dL — ABNORMAL HIGH (ref 70–99)
Glucose-Capillary: 152 mg/dL — ABNORMAL HIGH (ref 70–99)
Glucose-Capillary: 120 mg/dL — ABNORMAL HIGH (ref 70–99)

## 2023-05-08 LAB — CBC
HCT: 41.1 % (ref 39.0–52.0)
Hemoglobin: 13.1 g/dL (ref 13.0–17.0)
MCH: 31.3 pg (ref 26.0–34.0)
MCHC: 31.9 g/dL (ref 30.0–36.0)
MCV: 98.1 fL (ref 80.0–100.0)
Platelets: 281 10*3/uL (ref 150–400)
RBC: 4.19 MIL/uL — ABNORMAL LOW (ref 4.22–5.81)
RDW: 13.7 % (ref 11.5–15.5)
WBC: 16.5 10*3/uL — ABNORMAL HIGH (ref 4.0–10.5)
nRBC: 0.3 % — ABNORMAL HIGH (ref 0.0–0.2)

## 2023-05-08 LAB — PHOSPHORUS: Phosphorus: 3.6 mg/dL (ref 2.5–4.6)

## 2023-05-08 LAB — MAGNESIUM: Magnesium: 2.5 mg/dL — ABNORMAL HIGH (ref 1.7–2.4)

## 2023-05-08 LAB — PROCALCITONIN: Procalcitonin: 0.28 ng/mL

## 2023-05-08 MED ORDER — FAMOTIDINE 20 MG PO TABS
20.0000 mg | ORAL_TABLET | Freq: Every day | ORAL | Status: DC
  Administered 2023-05-09 – 2023-05-16 (×8): 20 mg via ORAL
  Filled 2023-05-08 (×8): qty 1

## 2023-05-08 MED ORDER — SODIUM CHLORIDE 0.9 % IV SOLN
100.0000 mg | Freq: Two times a day (BID) | INTRAVENOUS | Status: DC
  Administered 2023-05-08 – 2023-05-09 (×3): 100 mg via INTRAVENOUS
  Filled 2023-05-08 (×3): qty 100

## 2023-05-08 MED ORDER — POLYETHYLENE GLYCOL 3350 17 G PO PACK
17.0000 g | PACK | Freq: Every day | ORAL | Status: DC
  Administered 2023-05-09: 17 g via ORAL
  Filled 2023-05-08: qty 1

## 2023-05-08 MED ORDER — QUETIAPINE FUMARATE 50 MG PO TABS
50.0000 mg | ORAL_TABLET | Freq: Two times a day (BID) | ORAL | Status: DC
  Administered 2023-05-08 – 2023-05-09 (×2): 50 mg via ORAL
  Filled 2023-05-08 (×2): qty 1

## 2023-05-08 MED ORDER — THIAMINE MONONITRATE 100 MG PO TABS
100.0000 mg | ORAL_TABLET | Freq: Every day | ORAL | Status: DC
  Administered 2023-05-09 – 2023-05-16 (×8): 100 mg via ORAL
  Filled 2023-05-08 (×8): qty 1

## 2023-05-08 MED ORDER — DOCUSATE SODIUM 100 MG PO CAPS
100.0000 mg | ORAL_CAPSULE | Freq: Two times a day (BID) | ORAL | Status: DC
  Administered 2023-05-09: 100 mg via ORAL
  Filled 2023-05-08: qty 1

## 2023-05-08 MED ORDER — ORAL CARE MOUTH RINSE: 15.0000 mL | OROMUCOSAL | Status: DC | PRN

## 2023-05-08 MED ORDER — INSULIN ASPART 100 UNIT/ML IJ SOLN
3.0000 [IU] | INTRAMUSCULAR | Status: DC
  Administered 2023-05-08: 3 [IU] via SUBCUTANEOUS

## 2023-05-08 MED ORDER — HALOPERIDOL LACTATE 5 MG/ML IJ SOLN: 5.0000 mg | Freq: Four times a day (QID) | INTRAMUSCULAR | Status: DC | PRN

## 2023-05-08 NOTE — Inpatient Diabetes Management (Signed)
 Inpatient Diabetes Program Recommendations  AACE/ADA: New Consensus Statement on Inpatient Glycemic Control (2015)  Target Ranges:  Prepandial:   less than 140 mg/dL      Peak postprandial:   less than 180 mg/dL (1-2 hours)      Critically ill patients:  140 - 180 mg/dL   Lab Results  Component Value Date   GLUCAP 203 (H) 05/08/2023   HGBA1C 6.1 (H) 05/06/2023    Review of Glycemic Control  Latest Reference Range & Units 05/07/23 07:27 05/07/23 11:16 05/07/23 15:39 05/07/23 19:49 05/07/23 23:38 05/08/23 03:41 05/08/23 07:31  Glucose-Capillary 70 - 99 mg/dL 161 (H) 096 (H) 045 (H) 204 (H) 176 (H) 169 (H) 203 (H)   Diabetes history: None Outpatient Diabetes medications: None Current orders for Inpatient glycemic control:  Semglee 5 units daily Novolog 0-20 units q 4 hours Solumedrol 40 mg IV q 12 hours  Inpatient Diabetes Program Recommendations:    Consider adding Novolog tube feed coverage 2-3 units q 4 hours while on tube feeds.   Thanks,  Lorenza Cambridge, RN, BC-ADM Inpatient Diabetes Coordinator Pager [phone]  (8a-5p)

## 2023-05-08 NOTE — Progress Notes (Signed)
 05/08/2023 Seen for respiratory failure presumably AECOPD. Awake on vent, getting angry and wants tube out. Some rhonci for APP but sounded pretty clear to me. Some increase in WBC but wonder if just delayed demargination from steroids. I think we can work toward extubation, treat for CAP/AECOPD (see orders) Pressor need will resolve once off sedation. If looks okay off vent later this afternoon can transfer out.  My cc time 31 min Myrla Halsted MD PCCM

## 2023-05-08 NOTE — Procedures (Signed)
 Extubation Procedure Note  Patient Details:   Name: Johnny Rodriguez DOB: 1950/11/13 MRN: [id number]   Airway Documentation:    Vent end date: 05/08/23 Vent end time: 1033   Evaluation  O2 sats: stable throughout Complications: No apparent complications Patient did tolerate procedure well. Bilateral Breath Sounds: Clear, Diminished   Patient extubated per MD's order. Cuff leak present prior to extubation, no stridor noted post. Patient placed on 3L [name] and tolerating well at this time.     Yes      [name] 05/08/2023, 10:35 AM

## 2023-05-08 NOTE — Plan of Care (Signed)
  Problem: Clinical Measurements: Goal: Ability to maintain clinical measurements within normal limits will improve Outcome: Progressing Goal: Will remain free from infection Outcome: Progressing Goal: Diagnostic test results will improve Outcome: Progressing Goal: Respiratory complications will improve Outcome: Progressing Goal: Cardiovascular complication will be avoided Outcome: Progressing   Problem: Activity: Goal: Risk for activity intolerance will decrease Outcome: Progressing   Problem: Nutrition: Goal: Adequate nutrition will be maintained Outcome: Progressing   Problem: Coping: Goal: Level of anxiety will decrease Outcome: Progressing   Problem: Elimination: Goal: Will not experience complications related to bowel motility Outcome: Progressing Goal: Will not experience complications related to urinary retention Outcome: Progressing   Problem: Pain Managment: Goal: General experience of comfort will improve and/or be controlled Outcome: Progressing   Problem: Safety: Goal: Ability to remain free from injury will improve Outcome: Progressing   Problem: Skin Integrity: Goal: Risk for impaired skin integrity will decrease Outcome: Progressing   Problem: Skin Integrity: Goal: Risk for impaired skin integrity will decrease Outcome: Progressing   Problem: Tissue Perfusion: Goal: Adequacy of tissue perfusion will improve Outcome: Progressing

## 2023-05-08 NOTE — Progress Notes (Signed)
 05/08/2023 Fent and prop gtt dc'd by me to facilitate SBT.  Myrla Halsted MD PCCM

## 2023-05-08 NOTE — Progress Notes (Signed)
 NAME:  Johnny Rodriguez, MRN:  [id number], DOB:  [birth year], LOS: 2 ADMISSION DATE:  05/06/2023, CONSULTATION DATE:  05/06/2023 REFERRING MD: Eloise Harman - EDP, CHIEF COMPLAINT:  AECOPD    History of Present Illness:  73 year old man who presented to [facility] 3/26 for SOB, respiratory distress. PMHx significant for HTN, PAF, HFrEF (Echo 04/2023 with EF 40-45%), COPD with chronic hypoxic respiratory failure (on 3L O2 at home).  Patient initially presented to the ED with worsening SOB and cough with sputum production. On EMS arrival, patient's sats were in the 70s, was put on BiPAP.  Continues to smoke.  At the ED, exam notable for bilateral wheezes with decreased air movement, and tachycardia. Labs were notable for WBC 8.7, K5.4, SCr 2.27, BNP 920. AST 1255, ALT 979 CXR negative for acute findings. EKG demonstrated supraventricular tachycardia (rate 160) without ST elevation or T wave inversions.  Pertinent Medical History:  Atrial fibrillation on anticoagulation, HFrEF [EF 40 to 45%], hypertension, PE, COPD  Significant Hospital Events: Including procedures, antibiotic start and stop dates in addition to other pertinent events   3/26 - Sedated and intubated, transferred to the ICU.  Interim History / Subjective:  No significant events overnight. Pt awake and alert this morning, gesturing for tube removal. Mouthed that he wants coffee. Plan for extubation if passes SBT.   Objective:  Blood pressure 101/74, pulse 68, temperature (!) 96.9 F (36.1 C), temperature source Axillary, resp. rate 17, height 5\' 11"  (1.803 m), weight 75.3 kg, SpO2 95%. CVP:  [0 mmHg-12 mmHg] 2 mmHg  Vent Mode: PRVC FiO2 (%):  [40 %] 40 % Set Rate:  [16 bmp-22 bmp] 16 bmp Vt Set:  [600 mL] 600 mL PEEP:  [5 cmH20] 5 cmH20 Plateau Pressure:  [14 cmH20-17 cmH20] 14 cmH20   Intake/Output Summary (Last 24 hours) at 05/08/2023 0730 Last data filed at 05/08/2023 0700 Gross per 24 hour  Intake 2360.4 ml  Output 975 ml  Net  1385.4 ml   Filed Weights   05/07/23 0421 05/08/23 0500  Weight: 76 kg 75.3 kg   Physical Examination: General: Acutely ill-appearing older man in NAD. HEENT: [place]/AT, anicteric sclera, PERRL 2mm, moist mucous membranes. Neuro: Awake,unable to access orientation  . Responds to verbal stimuli. Following commands consistently. Moves all 4 extremities spontaneously.+Cough and +Gag  CV: irregularly irregular , no m/g/r. PULM: Breathing even and unlabored on vent PEEP 5/ FiO2 40% Lung fields coarse rhonchi . GI: Soft, nontender, nondistended. Normoactive bowel sounds. Extremities: no LE edema noted. Skin: Warm/dry, no rashes   Resolved Hospital Problem List:     Assessment & Plan:   Acute-on-chronic hypoxic and hypercapnic respiratory failure, on 3L HOT AECOPD Productive cough, worsening dyspnea, initially was placed on BiPAP, but failed due to worsening VBG, and mental status.  Sedated and intubated, and transferred to the ICU.  Chest x-ray and respiratory viral panel negative. - Continue full vent support ( 4-8 cc/kg IBW)  - Wean FiO2 for O2 sat 88-92% - Daily WUA/SBT as appropriate from a vent requirement standpoint - Continue bronchodilators (Pulmicort, Brovana/Yupelri, albuterol) - Continue Solumedrol BID, taper as indicated  - VAP bundle - Pulmonary hygiene  - PAD protocol for sedation : Propofol and Fentanyl for RASS 0 to -1 -  Respiratory culture 3/26 showed MOD GNRs and few GPC ( pairs). Moderate H. Flu - Antibiotic coverage with Cefepime / Doxycycline (broadened from azithromycin) - CXR pending 3/28  Shock, presumed in the setting of AECOPD, ?cardiogenic component - Goal MAP >  65 - Levophed titrated to goal MAP ; requirements decreasing (off as of 3/28 late AM)  - WBC uptrending (?steroid delayed response), hypothermic  - Broad spectrum antibiotics as above  Permanent A-fib with RVR (on Eliquis) HFrEF Likely cardiogenic shock in the setting of A-fib with RVR.   Etiology  of acute A-fib likely in the setting of COPD exacerbation.  On labs, BNP elevated to 920, and evidence of hepatic congestion.  Mild troponinemia that has flattened.  Echo 3/25 with LVEF 40-45% with decreased function and global hypokinesis, left atrium with mild to moderate dilation. TSH WNL - Cardiac monitoring  - Amiodarone gtt  - Optimize electrolytes for K> 4 , Mg > 2  - Lovenox for AC ( prophylactic dose for now )  AKI Hyperkalemia SCr 2.27, ( baseline 1.01-1.16.), K5.4.  Etiology includes prerenal vs. intrarenal vs. postrenal. - Trend BMP  - Replete electrolytes as indicated  - Monitor I &Os  - Avoid nephrotoxic agents as able - Ensure adequate renal perfusion   Transaminitis, ?2/2 hepatic congestion, improving - LFTs continue trending down - Check intermittently   Steroid-induced hyperglycemia - SSI, resistant scale  - TF coverage 3U Q4H - CBGs Q4H - Goal CBG 140-180  Best Practice (right click and "Reselect all SmartList Selections" daily)   Diet/type: NPO DVT prophylaxis: LMWH, SCDs GI prophylaxis: PPI Lines: Central line Foley:  Yes, and it is still needed Code Status:  limited Last date of multidisciplinary goals of care discussion: [Pending]  Critical care time:   The patient is critically ill with multiple organ system failure and requires high complexity decision making for assessment and support, frequent evaluation and titration of therapies, advanced monitoring, review of radiographic studies and interpretation of complex data.   Critical Care Time devoted to patient care services, exclusive of separately billable procedures, described in this note is 38 minutes.  Tim Lair, PA-C [facility] 05/08/23 7:30 AM  Please see [email or web address] for pager details.  From 7A-7P if no response, please call [phone] After hours, please call ELink [phone]

## 2023-05-09 DIAGNOSIS — I4821 Permanent atrial fibrillation: Secondary | ICD-10-CM | POA: Diagnosis not present

## 2023-05-09 DIAGNOSIS — N179 Acute kidney failure, unspecified: Secondary | ICD-10-CM | POA: Diagnosis not present

## 2023-05-09 DIAGNOSIS — J9621 Acute and chronic respiratory failure with hypoxia: Secondary | ICD-10-CM | POA: Diagnosis not present

## 2023-05-09 DIAGNOSIS — J9622 Acute and chronic respiratory failure with hypercapnia: Secondary | ICD-10-CM | POA: Diagnosis not present

## 2023-05-09 LAB — BASIC METABOLIC PANEL WITH GFR
Anion gap: 6 (ref 5–15)
BUN: 45 mg/dL — ABNORMAL HIGH (ref 8–23)
CO2: 36 mmol/L — ABNORMAL HIGH (ref 22–32)
Calcium: 8.3 mg/dL — ABNORMAL LOW (ref 8.9–10.3)
Chloride: 99 mmol/L (ref 98–111)
Creatinine, Ser: 1.01 mg/dL (ref 0.61–1.24)
GFR, Estimated: >60 mL/min (ref >60)
Glucose, Bld: 157 mg/dL — ABNORMAL HIGH (ref 70–99)
Potassium: 4.7 mmol/L (ref 3.5–5.1)
Sodium: 141 mmol/L (ref 135–145)

## 2023-05-09 LAB — CBC
HCT: 42.5 % (ref 39.0–52.0)
Hemoglobin: 13.1 g/dL (ref 13.0–17.0)
MCH: 31.6 pg (ref 26.0–34.0)
MCHC: 30.8 g/dL (ref 30.0–36.0)
MCV: 102.7 fL — ABNORMAL HIGH (ref 80.0–100.0)
Platelets: 268 10*3/uL (ref 150–400)
RBC: 4.14 MIL/uL — ABNORMAL LOW (ref 4.22–5.81)
RDW: 14.1 % (ref 11.5–15.5)
WBC: 17 10*3/uL — ABNORMAL HIGH (ref 4.0–10.5)
nRBC: 0.3 % — ABNORMAL HIGH (ref 0.0–0.2)

## 2023-05-09 LAB — CULTURE, RESPIRATORY W GRAM STAIN: Special Requests: NORMAL

## 2023-05-09 LAB — GLUCOSE, CAPILLARY
Glucose-Capillary: 141 mg/dL — ABNORMAL HIGH (ref 70–99)
Glucose-Capillary: 150 mg/dL — ABNORMAL HIGH (ref 70–99)
Glucose-Capillary: 175 mg/dL — ABNORMAL HIGH (ref 70–99)

## 2023-05-09 LAB — MAGNESIUM: Magnesium: 2.1 mg/dL (ref 1.7–2.4)

## 2023-05-09 MED ORDER — GABAPENTIN 400 MG PO CAPS
400.0000 mg | ORAL_CAPSULE | Freq: Three times a day (TID) | ORAL | Status: DC
  Administered 2023-05-09 – 2023-05-16 (×21): 400 mg via ORAL
  Filled 2023-05-09 (×21): qty 1

## 2023-05-09 MED ORDER — MAGNESIUM SULFATE 2 GM/50ML IV SOLN
2.0000 g | Freq: Once | INTRAVENOUS | Status: AC
  Administered 2023-05-09: 2 g via INTRAVENOUS
  Filled 2023-05-09: qty 50

## 2023-05-09 MED ORDER — ENSURE ENLIVE PO LIQD
237.0000 mL | Freq: Two times a day (BID) | ORAL | Status: DC
  Administered 2023-05-10 – 2023-05-16 (×9): 237 mL via ORAL

## 2023-05-09 MED ORDER — POLYETHYLENE GLYCOL 3350 17 G PO PACK: 17.0000 g | PACK | Freq: Every day | ORAL | Status: DC | PRN

## 2023-05-09 MED ORDER — AMIODARONE HCL IN DEXTROSE 360-4.14 MG/200ML-% IV SOLN
60.0000 mg/h | INTRAVENOUS | Status: AC
  Administered 2023-05-09 (×2): 60 mg/h via INTRAVENOUS
  Filled 2023-05-09: qty 200

## 2023-05-09 MED ORDER — UMECLIDINIUM BROMIDE 62.5 MCG/ACT IN AEPB
2.0000 | INHALATION_SPRAY | Freq: Every day | RESPIRATORY_TRACT | Status: DC
  Filled 2023-05-09: qty 7

## 2023-05-09 MED ORDER — AMIODARONE LOAD VIA INFUSION
150.0000 mg | Freq: Once | INTRAVENOUS | Status: AC
  Administered 2023-05-09: 150 mg via INTRAVENOUS
  Filled 2023-05-09: qty 83.34

## 2023-05-09 MED ORDER — ASPIRIN 81 MG PO TBEC
81.0000 mg | DELAYED_RELEASE_TABLET | Freq: Every day | ORAL | Status: DC
  Administered 2023-05-09 – 2023-05-16 (×8): 81 mg via ORAL
  Filled 2023-05-09 (×8): qty 1

## 2023-05-09 MED ORDER — AMOXICILLIN-POT CLAVULANATE 875-125 MG PO TABS
1.0000 | ORAL_TABLET | Freq: Two times a day (BID) | ORAL | Status: AC
  Administered 2023-05-09 – 2023-05-14 (×12): 1 via ORAL
  Filled 2023-05-09 (×13): qty 1

## 2023-05-09 MED ORDER — FOLIC ACID 1 MG PO TABS
1.0000 mg | ORAL_TABLET | Freq: Every day | ORAL | Status: DC
  Administered 2023-05-09 – 2023-05-16 (×8): 1 mg via ORAL
  Filled 2023-05-09 (×8): qty 1

## 2023-05-09 MED ORDER — PANTOPRAZOLE SODIUM 40 MG PO TBEC: 40.0000 mg | DELAYED_RELEASE_TABLET | Freq: Every day | ORAL | Status: DC | PRN

## 2023-05-09 MED ORDER — AMIODARONE HCL IN DEXTROSE 360-4.14 MG/200ML-% IV SOLN
30.0000 mg/h | INTRAVENOUS | Status: DC
  Administered 2023-05-09 – 2023-05-12 (×6): 30 mg/h via INTRAVENOUS
  Filled 2023-05-09 (×6): qty 200

## 2023-05-09 MED ORDER — APIXABAN 5 MG PO TABS
5.0000 mg | ORAL_TABLET | Freq: Two times a day (BID) | ORAL | Status: DC
  Administered 2023-05-09 – 2023-05-16 (×15): 5 mg via ORAL
  Filled 2023-05-09 (×15): qty 1

## 2023-05-09 MED ORDER — UMECLIDINIUM BROMIDE 62.5 MCG/ACT IN AEPB
1.0000 | INHALATION_SPRAY | Freq: Every day | RESPIRATORY_TRACT | Status: DC
  Administered 2023-05-10 – 2023-05-14 (×5): 1 via RESPIRATORY_TRACT
  Filled 2023-05-09: qty 7

## 2023-05-09 MED ORDER — PREDNISONE 20 MG PO TABS
40.0000 mg | ORAL_TABLET | Freq: Every day | ORAL | Status: AC
  Administered 2023-05-10 – 2023-05-14 (×5): 40 mg via ORAL
  Filled 2023-05-09 (×5): qty 2

## 2023-05-09 MED ORDER — FUROSEMIDE 10 MG/ML IJ SOLN
40.0000 mg | Freq: Four times a day (QID) | INTRAMUSCULAR | Status: AC
  Administered 2023-05-09 (×2): 40 mg via INTRAVENOUS
  Filled 2023-05-09 (×2): qty 4

## 2023-05-09 MED ORDER — FLUTICASONE FUROATE-VILANTEROL 100-25 MCG/ACT IN AEPB
1.0000 | INHALATION_SPRAY | Freq: Every day | RESPIRATORY_TRACT | Status: DC
  Administered 2023-05-10 – 2023-05-14 (×5): 1 via RESPIRATORY_TRACT
  Filled 2023-05-09: qty 28

## 2023-05-09 MED ORDER — ALBUTEROL SULFATE HFA 108 (90 BASE) MCG/ACT IN AERS: 1.0000 | INHALATION_SPRAY | RESPIRATORY_TRACT | Status: DC | PRN

## 2023-05-09 MED ORDER — LINACLOTIDE 145 MCG PO CAPS
145.0000 ug | ORAL_CAPSULE | Freq: Every day | ORAL | Status: DC
  Administered 2023-05-10 – 2023-05-16 (×7): 145 ug via ORAL
  Filled 2023-05-09 (×8): qty 1

## 2023-05-09 MED ORDER — METOPROLOL SUCCINATE ER 50 MG PO TB24
50.0000 mg | ORAL_TABLET | Freq: Every day | ORAL | Status: DC
  Administered 2023-05-09 – 2023-05-15 (×7): 50 mg via ORAL
  Filled 2023-05-09 (×8): qty 1

## 2023-05-09 MED ORDER — ALBUTEROL SULFATE (2.5 MG/3ML) 0.083% IN NEBU
2.5000 mg | INHALATION_SOLUTION | Freq: Four times a day (QID) | RESPIRATORY_TRACT | Status: DC | PRN
  Administered 2023-05-12: 2.5 mg via RESPIRATORY_TRACT
  Filled 2023-05-09 (×2): qty 3

## 2023-05-09 MED ORDER — SENNA 8.6 MG PO TABS: 1.0000 | ORAL_TABLET | Freq: Every day | ORAL | Status: DC | PRN

## 2023-05-09 MED ORDER — ALBUMIN HUMAN 25 % IV SOLN
25.0000 g | Freq: Four times a day (QID) | INTRAVENOUS | Status: AC
  Administered 2023-05-09: 12.5 g via INTRAVENOUS
  Administered 2023-05-09 – 2023-05-10 (×3): 25 g via INTRAVENOUS
  Filled 2023-05-09 (×4): qty 100

## 2023-05-09 MED ORDER — TRAZODONE HCL 50 MG PO TABS
50.0000 mg | ORAL_TABLET | Freq: Every evening | ORAL | Status: DC | PRN
  Administered 2023-05-11 – 2023-05-12 (×2): 50 mg via ORAL
  Filled 2023-05-09 (×2): qty 1

## 2023-05-09 NOTE — Progress Notes (Signed)
   NAME:  Johnny Rodriguez, MRN:  [id number], DOB:  13-Jun-1950, LOS: 3 ADMISSION DATE:  05/06/2023, CONSULTATION DATE:  05/06/2023 REFERRING MD: Eloise Harman - EDP, CHIEF COMPLAINT:  AECOPD    History of Present Illness:  73 year old man who presented to [facility] 3/26 for SOB, respiratory distress. PMHx significant for HTN, PAF, HFrEF (Echo 04/2023 with EF 40-45%), COPD with chronic hypoxic respiratory failure (on 3L O2 at home).  Patient initially presented to the ED with worsening SOB and cough with sputum production. On EMS arrival, patient's sats were in the 70s, was put on BiPAP.  Continues to smoke.  At the ED, exam notable for bilateral wheezes with decreased air movement, and tachycardia. Labs were notable for WBC 8.7, K5.4, SCr 2.27, BNP 920. AST 1255, ALT 979 CXR negative for acute findings. EKG demonstrated supraventricular tachycardia (rate 160) without ST elevation or T wave inversions.  Pertinent Medical History:  Atrial fibrillation on anticoagulation, HFrEF [EF 40 to 45%], hypertension, PE, COPD  Significant Hospital Events: Including procedures, antibiotic start and stop dates in addition to other pertinent events   3/26 - Sedated and intubated, transferred to the ICU. 3/28 extubated  Interim History / Subjective:  Breathing improved Wants to go home On 6LPM  Objective:  Blood pressure 109/77, pulse (!) 127, temperature 98 F (36.7 C), temperature source Oral, resp. rate (!) 25, height 5\' 11"  (1.803 m), weight 76.4 kg, SpO2 (!) 75%.    FiO2 (%):  [40 %] 40 %   Intake/Output Summary (Last 24 hours) at 05/09/2023 1101 Last data filed at 05/09/2023 0500 Gross per 24 hour  Intake 1531.48 ml  Output 520 ml  Net 1011.48 ml   Filed Weights   05/07/23 0421 05/08/23 0500 05/09/23 0500  Weight: 76 kg 75.3 kg 76.4 kg   Physical Examination: Chronically ill Diminished breath sounds c/w emphysema +muscle wasting Moves to command Nonlabored breathing pattern Abd soft L chest [place]:  minimal effusion would not chase after    Assessment & Plan:   Acute-on-chronic hypoxic and hypercapnic respiratory failure, on 3L HOT- improved AECOPD H influenza PNA Chronic afib on AC HFrEF does not really seem volume up AKI improved  - Nebs to MDI - Steroids to PO - Abx to augmentin - DC amio gtt and lovenox, restart PTA bb and eliquis - He wants to go home, will have him walk around unit and if tolerates symptomatically he can go home; if not I may give a dose or two of lasix  Myrla Halsted MD PCCM Please see [email or web address] for pager details.  From 7A-7P if no response, please call [phone] After hours, please call ELink [phone]

## 2023-05-09 NOTE — Progress Notes (Signed)
 05/09/2023 Did not tolerate walking trial.  Giving some lasix.  Myrla Halsted MD PCCM

## 2023-05-09 NOTE — Plan of Care (Signed)
  Problem: Clinical Measurements: Goal: Ability to maintain clinical measurements within normal limits will improve Outcome: Progressing Goal: Will remain free from infection Outcome: Progressing Goal: Diagnostic test results will improve Outcome: Progressing Goal: Respiratory complications will improve Outcome: Progressing Goal: Cardiovascular complication will be avoided Outcome: Progressing   Problem: Activity: Goal: Risk for activity intolerance will decrease Outcome: Progressing   Problem: Nutrition: Goal: Adequate nutrition will be maintained Outcome: Progressing   Problem: Coping: Goal: Level of anxiety will decrease Outcome: Progressing   Problem: Elimination: Goal: Will not experience complications related to bowel motility Outcome: Progressing Goal: Will not experience complications related to urinary retention Outcome: Progressing   Problem: Pain Managment: Goal: General experience of comfort will improve and/or be controlled Outcome: Progressing   Problem: Safety: Goal: Ability to remain free from injury will improve Outcome: Progressing   Problem: Skin Integrity: Goal: Risk for impaired skin integrity will decrease Outcome: Progressing   Problem: Fluid Volume: Goal: Ability to maintain a balanced intake and output will improve Outcome: Progressing

## 2023-05-10 DIAGNOSIS — J9621 Acute and chronic respiratory failure with hypoxia: Secondary | ICD-10-CM | POA: Diagnosis not present

## 2023-05-10 LAB — BASIC METABOLIC PANEL WITH GFR
BUN: 37 mg/dL — ABNORMAL HIGH (ref 8–23)
CO2: >45 mmol/L — ABNORMAL HIGH (ref 22–32)
Calcium: 9.4 mg/dL (ref 8.9–10.3)
Chloride: 93 mmol/L — ABNORMAL LOW (ref 98–111)
Creatinine, Ser: 0.95 mg/dL (ref 0.61–1.24)
GFR, Estimated: >60 mL/min (ref >60)
Glucose, Bld: 120 mg/dL — ABNORMAL HIGH (ref 70–99)
Potassium: 4.5 mmol/L (ref 3.5–5.1)
Sodium: 143 mmol/L (ref 135–145)

## 2023-05-10 LAB — CBC
HCT: 44.5 % (ref 39.0–52.0)
Hemoglobin: 13.1 g/dL (ref 13.0–17.0)
MCH: 30.7 pg (ref 26.0–34.0)
MCHC: 29.4 g/dL — ABNORMAL LOW (ref 30.0–36.0)
MCV: 104.2 fL — ABNORMAL HIGH (ref 80.0–100.0)
Platelets: 273 10*3/uL (ref 150–400)
RBC: 4.27 MIL/uL (ref 4.22–5.81)
RDW: 14.2 % (ref 11.5–15.5)
WBC: 15.6 10*3/uL — ABNORMAL HIGH (ref 4.0–10.5)
nRBC: 0.2 % (ref 0.0–0.2)

## 2023-05-10 LAB — MAGNESIUM: Magnesium: 2.3 mg/dL (ref 1.7–2.4)

## 2023-05-10 NOTE — Evaluation (Signed)
 Physical Therapy Evaluation Patient Details Name: Johnny Rodriguez MRN: [id number] DOB: 17-Oct-1950 Today's Date: 05/10/2023  History of Present Illness  73 year old man who presented 05/06/23 with increasing dyspnea. Recently homeless and without meds. Intubated 3/26-3/28; afib with RVR; PMH significant for a-fib on coumadin, alcohol use, COPD on 3L oxygen, heart failure EF 40%.  Clinical Impression   Pt admitted secondary to problem above with deficits below. PTA patient was living alone in an apartment (per pt report). He primarily uses a power chair for locomotion, however can walk short distances with his rollator. Pt currently requires min assist to ambulate 50 ft (assist after 25 ft when legs became fatigued). Anticipate patient may benefit from PT to address problems listed below. Will continue to follow acutely to maximize functional mobility independence and safety. Could benefit from HHPT for continued strengthening to improve safety with transfers and limited gait.           If plan is discharge home, recommend the following: A little help with walking and/or transfers;Assist for transportation   Can travel by private vehicle        Equipment Recommendations None recommended by PT  Recommendations for Other Services  OT consult    Functional Status Assessment Patient has had a recent decline in their functional status and demonstrates the ability to make significant improvements in function in a reasonable and predictable amount of time.     Precautions / Restrictions Precautions Precautions: Fall;Other (comment) Precaution/Restrictions Comments: watch sats      Mobility  Bed Mobility               General bed mobility comments: up in recliner    Transfers Overall transfer level: Needs assistance Equipment used: Rolling walker (2 wheels) Transfers: Sit to/from Stand Sit to Stand: Contact guard assist           General transfer comment: vc for proper  hand placement for safety and ability to stand without needing assist    Ambulation/Gait Ambulation/Gait assistance: Min assist Gait Distance (Feet): 50 Feet Assistive device: Rolling walker (2 wheels) Gait Pattern/deviations: Step-through pattern, Step-to pattern, Decreased stride length, Knees buckling   Gait velocity interpretation: <1.8 ft/sec, indicate of risk for recurrent falls   General Gait Details: vc for proximity to RW; after ~25 ft pt's knees clearly becoming weaker and pt wanted to continue farther; convinced to turn around and pt with partial buckling of bil LEs multiple times on return to chair  Stairs            Wheelchair Mobility     Tilt Bed    Modified Rankin (Stroke Patients Only)       Balance Overall balance assessment: Needs assistance Sitting-balance support: No upper extremity supported, Feet supported Sitting balance-Leahy Scale: Fair     Standing balance support: Bilateral upper extremity supported, Reliant on assistive device for balance Standing balance-Leahy Scale: Poor                               Pertinent Vitals/Pain Pain Assessment Pain Assessment: No/denies pain    Home Living Family/patient expects to be discharged to:: Private residence Living Arrangements: Alone   Type of Home: Apartment Home Access: Level entry       Home Layout: One level Home Equipment: Rollator (4 wheels);Wheelchair - power (full list not obtained)      Prior Function Prior Level of Function : Independent/Modified Independent  Mobility Comments: only walks short distances in the home with rollator; mostly uses power chair inside and community ADLs Comments: reports he drives his powerchair to get his groceries     Extremity/Trunk Assessment   Upper Extremity Assessment Upper Extremity Assessment: Generalized weakness    Lower Extremity Assessment Lower Extremity Assessment: Generalized weakness     Cervical / Trunk Assessment Cervical / Trunk Assessment: Kyphotic  Communication   Communication Communication: No apparent difficulties    Cognition Arousal: Alert Behavior During Therapy: WFL for tasks assessed/performed   PT - Cognitive impairments: Sequencing, Problem solving, Safety/Judgement                       PT - Cognition Comments: required cues for safe sequencing with RW, cues for when to turn back with legs getting weak and pt wanting to "keep going so I can impress you and the doctor" Following commands: Intact       Cueing Cueing Techniques: Verbal cues     General Comments General comments (skin integrity, edema, etc.): See ambulatory oxygen saturation note (separate)    Exercises     Assessment/Plan    PT Assessment Patient needs continued PT services  PT Problem List Decreased strength;Decreased activity tolerance;Decreased balance;Decreased mobility;Decreased knowledge of use of DME;Decreased safety awareness;Cardiopulmonary status limiting activity       PT Treatment Interventions DME instruction;Gait training;Functional mobility training;Therapeutic activities;Therapeutic exercise;Balance training;Cognitive remediation;Patient/family education    PT Goals (Current goals can be found in the Care Plan section)  Acute Rehab PT Goals Patient Stated Goal: return home today PT Goal Formulation: With patient Time For Goal Achievement: 05/24/23 Potential to Achieve Goals: Fair    Frequency Min 2X/week     Co-evaluation               AM-PAC PT "6 Clicks" Mobility  Outcome Measure Help needed turning from your back to your side while in a flat bed without using bedrails?: A Little Help needed moving from lying on your back to sitting on the side of a flat bed without using bedrails?: A Little Help needed moving to and from a bed to a chair (including a wheelchair)?: A Little Help needed standing up from a chair using your arms  (e.g., wheelchair or bedside chair)?: A Little Help needed to walk in hospital room?: A Little Help needed climbing 3-5 steps with a railing? : Total 6 Click Score: 16    End of Session Equipment Utilized During Treatment: Gait belt;Oxygen Activity Tolerance: Treatment limited secondary to medical complications (Comment);Patient limited by fatigue (decr in sats) Patient left: in chair;with call bell/phone within reach Nurse Communication: Mobility status;Other (comment) (sats decr and need for 8L when walking) PT Visit Diagnosis: Muscle weakness (generalized) (M62.81);Difficulty in walking, not elsewhere classified (R26.2)    Time: 4098-1191 PT Time Calculation (min) (ACUTE ONLY): 20 min   Charges:   PT Evaluation $PT Eval Low Complexity: 1 Low   PT General Charges $$ ACUTE PT VISIT: 1 Visit          Jerolyn Center, PT Acute Rehabilitation Services  Office [phone]   Zena Amos 05/10/2023, 11:54 AM

## 2023-05-10 NOTE — Plan of Care (Signed)

## 2023-05-10 NOTE — Progress Notes (Signed)
  Progress Note   Patient: Johnny Rodriguez GNF:[id number] DOB: 1950/12/15 DOA: 05/06/2023     4 DOS: the patient was seen and examined on 05/10/2023   Brief hospital course: 73 year old man who presented to [facility] 3/26 for SOB, respiratory distress. PMHx significant for HTN, PAF, HFrEF (Echo 04/2023 with EF 40-45%), COPD with chronic hypoxic respiratory failure (on 3L O2 at home).   Patient initially presented to the ED with worsening SOB and cough with sputum production. On EMS arrival, patient's sats were in the 70s, was put on BiPAP.  Continues to smoke.   At the ED, exam notable for bilateral wheezes with decreased air movement, and tachycardia. Labs were notable for WBC 8.7, K5.4, SCr 2.27, BNP 920. AST 1255, ALT 979 CXR negative for acute findings. EKG demonstrated supraventricular tachycardia (rate 160) without ST elevation or T wave inversions.  Assessment and Plan: Acute on chronic hypoxic and hypercapnic resp failure due to acute COPD exacerbation and H influenza PNA  - Albuterol q6 hr PRN  - Augmentin 875 mg PO q12  - Breo ellipta 1 puff daily  - Incruse ellipta 1 puff daily  - Prednisone 40 mg PO daily   Chronic afib on AC  - IV Amiodarone drip   - Eliquis 5 mg PO bid   HFrEF - Not volume overloaded  - ASA 81 mg PO daily  - Toprol XL 50 mg PO daily   Subjective: Pt seen and examined at the bedside. Pt's home oxygen is 3L but today he is 6L at rest and required 8L while working with PT today. He is also still on the IV amiodarone drip. I advised the pt that he is not ready for discharge at this point.   Physical Exam: Vitals:   05/10/23 0336 05/10/23 0742 05/10/23 0746 05/10/23 1042  BP:   114/82 132/83  Pulse:      Resp:      Temp:   98.3 F (36.8 C) 98.4 F (36.9 C)  TempSrc:   Oral Oral  SpO2:  (!) 88%    Weight: 75.7 kg     Height:       Physical Exam HENT:     Head: Normocephalic.     Mouth/Throat:     Mouth: Mucous membranes are moist.  Cardiovascular:      Comments: Intermittent tachycardia  Pulmonary:     Effort: Pulmonary effort is normal.  Abdominal:     Palpations: Abdomen is soft.  Musculoskeletal:        General: Normal range of motion.     Cervical back: Neck supple.  Skin:    General: Skin is warm.  Neurological:     Mental Status: He is alert. Mental status is at baseline.  Psychiatric:        Mood and Affect: Mood normal.      Disposition: Status is: Inpatient Remains inpatient appropriate because: Oxygen titration and IV amiodarone drip   Planned Discharge Destination: Home    Time spent: 35 minutes  Author: Baron Hamper , MD 05/10/2023 1:22 PM  For on call review [email or web address].

## 2023-05-10 NOTE — Progress Notes (Signed)
 SATURATION QUALIFICATIONS: (This note is used to comply with regulatory documentation for home oxygen)  Patient Saturations on 6L at Rest = 85%  Patient Saturations on Room Air while Ambulating = NT due to low sats on 6L  Patient Saturations on 8 Liters of oxygen while Ambulating = 86%  Please briefly explain why patient needs home oxygen:  Patient required incr in oxygen to maintain sats >85% during functional activity.    Jerolyn Center, PT Acute Rehabilitation Services  Office [phone]

## 2023-05-11 DIAGNOSIS — J9621 Acute and chronic respiratory failure with hypoxia: Secondary | ICD-10-CM | POA: Diagnosis not present

## 2023-05-11 LAB — CBC
HCT: 45.5 % (ref 39.0–52.0)
Hemoglobin: 13.6 g/dL (ref 13.0–17.0)
MCH: 31.3 pg (ref 26.0–34.0)
MCHC: 29.9 g/dL — ABNORMAL LOW (ref 30.0–36.0)
MCV: 104.6 fL — ABNORMAL HIGH (ref 80.0–100.0)
Platelets: 274 10*3/uL (ref 150–400)
RBC: 4.35 MIL/uL (ref 4.22–5.81)
RDW: 13.7 % (ref 11.5–15.5)
WBC: 18.9 10*3/uL — ABNORMAL HIGH (ref 4.0–10.5)
nRBC: 0.1 % (ref 0.0–0.2)

## 2023-05-11 LAB — BASIC METABOLIC PANEL WITH GFR
Anion gap: 6 (ref 5–15)
BUN: 35 mg/dL — ABNORMAL HIGH (ref 8–23)
CO2: 41 mmol/L — ABNORMAL HIGH (ref 22–32)
Calcium: 9.5 mg/dL (ref 8.9–10.3)
Chloride: 94 mmol/L — ABNORMAL LOW (ref 98–111)
Creatinine, Ser: 0.84 mg/dL (ref 0.61–1.24)
GFR, Estimated: >60 mL/min (ref >60)
Glucose, Bld: 178 mg/dL — ABNORMAL HIGH (ref 70–99)
Potassium: 5.2 mmol/L — ABNORMAL HIGH (ref 3.5–5.1)
Sodium: 141 mmol/L (ref 135–145)

## 2023-05-11 LAB — MAGNESIUM: Magnesium: 2.2 mg/dL (ref 1.7–2.4)

## 2023-05-11 NOTE — Progress Notes (Signed)
  Progress Note   Patient: Johnny Rodriguez VWU:[id number] DOB: [birth year] DOA: 05/06/2023     5 DOS: the patient was seen and examined on 05/11/2023   Brief hospital course: 73 year old man who presented to [facility] 3/26 for SOB, respiratory distress. PMHx significant for HTN, PAF, HFrEF (Echo 04/2023 with EF 40-45%), COPD with chronic hypoxic respiratory failure (on 3L O2 at home).   Patient initially presented to the ED with worsening SOB and cough with sputum production. On EMS arrival, patient's sats were in the 70s, was put on BiPAP.  Continues to smoke.   At the ED, exam notable for bilateral wheezes with decreased air movement, and tachycardia. Labs were notable for WBC 8.7, K5.4, SCr 2.27, BNP 920. AST 1255, ALT 979 CXR negative for acute findings. EKG demonstrated supraventricular tachycardia (rate 160) without ST elevation or T wave inversions.  Assessment and Plan: Acute on chronic hypoxic and hypercapnic resp failure due to acute COPD exacerbation and H influenza PNA  - Albuterol q6 hr PRN  - Augmentin 875 mg PO q12  - Breo ellipta 1 puff daily  - Incruse ellipta 1 puff daily  - Prednisone 40 mg PO daily    Chronic afib on AC  - IV Amiodarone drip   - Eliquis 5 mg PO bid    HFrEF - Not volume overloaded  - ASA 81 mg PO daily  - Toprol XL 50 mg PO daily   Subjective: Pt seen and examined at the bedside. He is very eager to go home. However, I had to explain to him that he is still requiring 6L O2 (his baseline is 3L O2). Furthermore, he is still requiring the amiodarone drip. He is working with PT but per the PT note: Gait Pattern/deviations: Step-through pattern, Step-to pattern, Decreased stride length, Knees buckling.     Physical Exam: Vitals:   05/10/23 2352 05/11/23 0504 05/11/23 0712 05/11/23 0752  BP: 96/68 127/75  117/74  Pulse: 66 80  87  Resp: 20 (!) 24  20  Temp: 98.9 F (37.2 C) 97.7 F (36.5 C)  98.4 F (36.9 C)  TempSrc: Oral Oral  Oral  SpO2: 94% 96%  (!) 89% 90%  Weight:  77.1 kg    Height:       HENT:     Head: Normocephalic.     Mouth/Throat:     Mouth: Mucous membranes are moist.  Cardiovascular:     Comments: Intermittent tachycardia on tele  Pulmonary:     Effort: Pulmonary effort is normal.  Abdominal:     Palpations: Abdomen is soft.  Musculoskeletal:        General: Normal range of motion.     Cervical back: Neck supple.  Skin:    General: Skin is warm.  Neurological:     Mental Status: He is alert. Mental status is at baseline.  Psychiatric:        Mood and Affect: Mood normal.      Disposition: Status is: Inpatient Remains inpatient appropriate because: oxygen titration and IV amio drip  Planned Discharge Destination: Home    Time spent: 35 minutes  Author: Baron Hamper , MD 05/11/2023 11:19 AM  For on call review [email or web address].

## 2023-05-11 NOTE — Care Management Important Message (Signed)
 Important Message  Patient Details  Name: Johnny Rodriguez MRN: [id number] Date of Birth: 1950-07-21   Important Message Given:  Yes - Medicare IM     Dorena Bodo 05/11/2023, 3:39 PM

## 2023-05-11 NOTE — TOC Progression Note (Signed)
 Transition of Care [facility]) - Progression Note    Patient Details  Name: Johnny Rodriguez MRN: [id number] Date of Birth: 08/26/1950  Transition of [facility]) CM/SW Contact  Marliss Coots, LCSW Phone Number: 05/11/2023, 4:09 PM  Clinical Narrative:     4:09 PM CSW introduced self and role to patient at bedside. Patient informed CSW of expectation to discharge home (resides alone in an apartment) via friend, Willaim Sheng, at discharge. Patient expressed concerns of paying rent on time due to hospitalization. Patient declined financial assistance resources. CSW informed patient of ATM in hospital. Patient provided CSW Billie's contact information to add to contact list on chart.  Expected Discharge Plan: Home/Self Care Barriers to Discharge: Continued Medical Work up  Expected Discharge Plan and Services       Living arrangements for the past 2 months: Apartment                                       Social Determinants of Health (SDOH) Interventions SDOH Screenings   Food Insecurity: Patient Unable To Answer (05/06/2023)  Housing: Patient Unable To Answer (05/06/2023)  Transportation Needs: Patient Unable To Answer (05/06/2023)  Utilities: Patient Unable To Answer (05/06/2023)  Alcohol Screen: Low Risk  (09/11/2020)  Recent Concern: Alcohol Screen - Medium Risk (08/07/2020)  Depression (PHQ2-9): Low Risk  (11/21/2022)  Financial Resource Strain: Low Risk  (11/21/2022)  Physical Activity: Inactive (11/21/2022)  Social Connections: Patient Unable To Answer (05/06/2023)  Stress: No Stress Concern Present (11/21/2022)  Tobacco Use: High Risk (05/06/2023)  Health Literacy: Adequate Health Literacy (11/21/2022)    Readmission Risk Interventions     No data to display

## 2023-05-11 NOTE — Plan of Care (Signed)
  Problem: Clinical Measurements: Goal: Will remain free from infection Outcome: Progressing   Problem: Nutrition: Goal: Adequate nutrition will be maintained Outcome: Progressing   

## 2023-05-12 ENCOUNTER — Inpatient Hospital Stay (HOSPITAL_COMMUNITY)

## 2023-05-12 DIAGNOSIS — J9621 Acute and chronic respiratory failure with hypoxia: Secondary | ICD-10-CM | POA: Diagnosis not present

## 2023-05-12 LAB — BASIC METABOLIC PANEL WITH GFR
Anion gap: 5 (ref 5–15)
BUN: 25 mg/dL — ABNORMAL HIGH (ref 8–23)
CO2: 45 mmol/L — ABNORMAL HIGH (ref 22–32)
Calcium: 9 mg/dL (ref 8.9–10.3)
Chloride: 89 mmol/L — ABNORMAL LOW (ref 98–111)
Creatinine, Ser: 0.7 mg/dL (ref 0.61–1.24)
GFR, Estimated: >60 mL/min (ref >60)
Glucose, Bld: 154 mg/dL — ABNORMAL HIGH (ref 70–99)
Potassium: 4.8 mmol/L (ref 3.5–5.1)
Sodium: 139 mmol/L (ref 135–145)

## 2023-05-12 LAB — CBC
HCT: 41.3 % (ref 39.0–52.0)
Hemoglobin: 12.6 g/dL — ABNORMAL LOW (ref 13.0–17.0)
MCH: 31.2 pg (ref 26.0–34.0)
MCHC: 30.5 g/dL (ref 30.0–36.0)
MCV: 102.2 fL — ABNORMAL HIGH (ref 80.0–100.0)
Platelets: 221 10*3/uL (ref 150–400)
RBC: 4.04 MIL/uL — ABNORMAL LOW (ref 4.22–5.81)
RDW: 13.2 % (ref 11.5–15.5)
WBC: 16.1 10*3/uL — ABNORMAL HIGH (ref 4.0–10.5)
nRBC: 0 % (ref 0.0–0.2)

## 2023-05-12 LAB — MAGNESIUM: Magnesium: 1.9 mg/dL (ref 1.7–2.4)

## 2023-05-12 MED ORDER — SACCHAROMYCES BOULARDII 250 MG PO CAPS
250.0000 mg | ORAL_CAPSULE | Freq: Two times a day (BID) | ORAL | Status: DC
  Administered 2023-05-12 – 2023-05-16 (×8): 250 mg via ORAL
  Filled 2023-05-12 (×8): qty 1

## 2023-05-12 MED ORDER — ADULT MULTIVITAMIN W/MINERALS CH
1.0000 | ORAL_TABLET | Freq: Every day | ORAL | Status: DC
  Administered 2023-05-12 – 2023-05-16 (×5): 1 via ORAL
  Filled 2023-05-12 (×5): qty 1

## 2023-05-12 MED ORDER — FUROSEMIDE 10 MG/ML IJ SOLN
40.0000 mg | Freq: Once | INTRAMUSCULAR | Status: AC
  Administered 2023-05-12: 40 mg via INTRAVENOUS
  Filled 2023-05-12: qty 4

## 2023-05-12 NOTE — Progress Notes (Signed)
 Patient Oxygen sats down to 70's on 6L, pt in chair.  Placed on 8L HF, breathing treatment given as prescribed, sats remain low 80's.  Oxygen up to 10L HF.  Rapid response, Theodoro Grist called for patient change in status.  Will page triad on call.  Dave at bedside.  CXR ordered per RR

## 2023-05-12 NOTE — TOC Progression Note (Signed)
 Transition of Care [facility]) - Progression Note    Patient Details  Name: Johnny Rodriguez MRN: [id number] Date of Birth: [birth year]  Transition of Care [facility]) CM/SW Contact  Lawerance Sabal, RN Phone Number: 05/12/2023, 11:58 AM  Clinical Narrative:     Sherron Monday w patient at bedside. He has home oxygen at 3L through [employer], currently working on weaning back to baseline.  We discussed home health services and he would like to restart with [facility]. Referral placed and accepted, will need HH orders and face to face.  He was concerned about his rent and provided a number to call and I spoke a person there who states that his rent is paid for April, his balance is $0, nurse to relay message to him.  Girlfriend to provide transportation home and transport tank of O2.   Expected Discharge Plan: Home/Self Care Barriers to Discharge: Continued Medical Work up  Expected Discharge Plan and Services   Discharge Planning Services: CM Consult Post Acute Care Choice: Home Health Living arrangements for the past 2 months: Apartment                           HH Arranged: PT, OT HH Agency: [facility] Date HH Agency Contacted: 05/12/23 Time HH Agency Contacted: 1158 Representative spoke with at [facility] Agency: Haywood Lasso   Social Determinants of Health (SDOH) Interventions SDOH Screenings   Food Insecurity: Patient Unable To Answer (05/06/2023)  Housing: Patient Unable To Answer (05/06/2023)  Transportation Needs: Patient Unable To Answer (05/06/2023)  Utilities: Patient Unable To Answer (05/06/2023)  Alcohol Screen: Low Risk  (09/11/2020)  Recent Concern: Alcohol Screen - Medium Risk (08/07/2020)  Depression (PHQ2-9): Low Risk  (11/21/2022)  Financial Resource Strain: Low Risk  (11/21/2022)  Physical Activity: Inactive (11/21/2022)  Social Connections: Patient Unable To Answer (05/06/2023)  Stress: No Stress Concern Present (11/21/2022)  Tobacco Use: High Risk (05/06/2023)  Health Literacy: Adequate  Health Literacy (11/21/2022)    Readmission Risk Interventions     No data to display

## 2023-05-12 NOTE — Progress Notes (Signed)
 Physical Therapy Treatment Patient Details Name: Johnny Rodriguez MRN: [id number] DOB: August 09, 1950 Today's Date: 05/12/2023   History of Present Illness 73 year old man who presented 05/06/23 with increasing dyspnea. Recently homeless and without meds. Intubated 3/26-3/28; afib with RVR; PMH significant for a-fib on coumadin, alcohol use, COPD on 3L oxygen, heart failure EF 40%.    PT Comments  Patient on 6L at rest with sats 88%. Ambulated better today and progressed to 90 ft with RW on 6L with sats 83%. Required cues for safe use of RW.    If plan is discharge home, recommend the following: A little help with walking and/or transfers;Assist for transportation   Can travel by private vehicle        Equipment Recommendations  None recommended by PT    Recommendations for Other Services       Precautions / Restrictions Precautions Precautions: Fall;Other (comment) Precaution/Restrictions Comments: watch sats Restrictions Weight Bearing Restrictions Per Provider Order: No     Mobility  Bed Mobility               General bed mobility comments: up in recliner    Transfers Overall transfer level: Needs assistance Equipment used: Rolling walker (2 wheels) Transfers: Sit to/from Stand Sit to Stand: Supervision           General transfer comment: vc for proper hand placement for safety    Ambulation/Gait Ambulation/Gait assistance: Contact guard assist Gait Distance (Feet): 90 Feet Assistive device: Rolling walker (2 wheels) Gait Pattern/deviations: Step-through pattern, Step-to pattern, Decreased stride length       General Gait Details: vc for proximity to RW; legs more stable compared to last session and able to incr distance significantly   Stairs             Wheelchair Mobility     Tilt Bed    Modified Rankin (Stroke Patients Only)       Balance Overall balance assessment: Needs assistance Sitting-balance support: No upper extremity  supported, Feet supported Sitting balance-Leahy Scale: Fair     Standing balance support: Bilateral upper extremity supported, Reliant on assistive device for balance Standing balance-Leahy Scale: Poor                              Communication Communication Communication: No apparent difficulties  Cognition Arousal: Alert Behavior During Therapy: WFL for tasks assessed/performed   PT - Cognitive impairments: Sequencing, Problem solving, Safety/Judgement                       PT - Cognition Comments: required cues for safe sequencing with RW Following commands: Intact      Cueing Cueing Techniques: Verbal cues  Exercises      General Comments        Pertinent Vitals/Pain Pain Assessment Pain Assessment: No/denies pain    Home Living                          Prior Function            PT Goals (current goals can now be found in the care plan section) Acute Rehab PT Goals Patient Stated Goal: return home today Time For Goal Achievement: 05/24/23 Potential to Achieve Goals: Fair Progress towards PT goals: Progressing toward goals    Frequency    Min 2X/week      PT Plan  Co-evaluation              AM-PAC PT "6 Clicks" Mobility   Outcome Measure  Help needed turning from your back to your side while in a flat bed without using bedrails?: A Little Help needed moving from lying on your back to sitting on the side of a flat bed without using bedrails?: A Little Help needed moving to and from a bed to a chair (including a wheelchair)?: A Little Help needed standing up from a chair using your arms (e.g., wheelchair or bedside chair)?: A Little Help needed to walk in hospital room?: A Little Help needed climbing 3-5 steps with a railing? : Total 6 Click Score: 16    End of Session Equipment Utilized During Treatment: Gait belt;Oxygen Activity Tolerance: Treatment limited secondary to medical complications (Comment)  (decr in sats) Patient left: in chair;with call bell/phone within reach   PT Visit Diagnosis: Muscle weakness (generalized) (M62.81);Difficulty in walking, not elsewhere classified (R26.2)     Time: 1610-9604 PT Time Calculation (min) (ACUTE ONLY): 19 min  Charges:    $Gait Training: 8-22 mins PT General Charges $$ ACUTE PT VISIT: 1 Visit                      Jerolyn Center, PT Acute Rehabilitation Services  Office [phone]    Zena Amos 05/12/2023, 3:32 PM

## 2023-05-12 NOTE — Progress Notes (Addendum)
 Nutrition Follow-up  DOCUMENTATION CODES:   Non-severe (moderate) malnutrition in context of chronic illness, Not applicable  INTERVENTION:  Add MVI w/ minerals  Continue thiamine and folic acid daily Continue Ensure Enlive po BID, each supplement provides 350 kcal and 20 grams of protein.  Add snacks TID between meals Florastor BID r/t ABX use Magic cup TID with meals, each supplement provides 290 kcal and 9 grams of protein   NUTRITION DIAGNOSIS:  Moderate Malnutrition related to chronic illness as evidenced by severe muscle depletion, moderate muscle depletion, moderate fat depletion. - remains applicable  GOAL:  Patient will meet greater than or equal to 90% of their needs - progressing  MONITOR:  Vent status, Labs, Weight trends, TF tolerance, Skin  REASON FOR ASSESSMENT:  Consult, Ventilator Enteral/tube feeding initiation and management  ASSESSMENT:   73 yo male admitted with respiratory distress with acute on chronic respiratory failure with COPD exacerbation requiring intubation, shock-likely cardiogenic, AKI. PMH includes COPD on 3L at home, HFrEF, HTN, current smoker  (tobacco)  3/26 Admitted, Intubated. US abdomen: Trace free fluid around the liver edge. No evidence of cholelithiasis or acute cholecystitis. Renal US wit normal kidneys  3/27 TF initiated  3/28 extubated/OGT removed 3/29 txr out of ICU  Admitted under CIWA protocol. Patient up in chair at time of bedside visit. He voices no pain. Continues to require more oxygen than baseline. Continue to diurese, as able. Received one more dose of Lasix today. High risk for readmission.   Average Meal Intake 3/28: 75-100% x2 documented meals 3/29: 45% x1 documented meals 3/31: 100% x3 documented meals  Intake desirable. Pt does endorse hunger between meals. Will order snacks. Reports desirable appetite PTA. No difficulties chewing or swallowing and no recent N/V/C/D.   24 Hour Recall B: grits, eggs over light,  sausage, juice or coffee w/ milk L: steak sandwich w/ LTO, cream potatoes, wine, and beer D: beef stew or [place] sandwich w/ LTO w/ wine  UBW stated as around 146lbs, which is quesitonable. Current weight 165lbs. Has diuresed some during admission. Per chart review, he has shown 11% wt loss in six months. This is considered clinically significant for the time frame. Continues on prednisone with some mild edema noted to BLEs.  1.1L UOP x24 hours.  Admit Weight: 76kg Current Weight: 76.9kg Lowest Weight: 75.3kg on 3/28   Intake/Output Summary (Last 24 hours) at 05/12/2023 1818 Last data filed at 05/12/2023 1100 Gross per 24 hour  Intake --  Output 1100 ml  Net -1100 ml     Net IO Since Admission: -2,455.15 mL [05/12/23 1816]   Meds: ABX, famotidine, folic acid, prednisone, thiamine   Labs: Na+ 139 (wdl) K+ 5.2>4.8 (wdl) Corr Ca 10.3 (H) WBC 40.9>81.1>91.4 (H) CBGs 154-178 x24 hours A1c 6.1 (04/2023)   Diet Order:   Diet Order             Diet regular Room service appropriate? Yes; Fluid consistency: Thin  Diet effective now            EDUCATION NEEDS:   Not appropriate for education at this time  Skin:  Skin Assessment: Reviewed RN Assessment  Last BM:  3/31 - type6 x2  Height:  Ht Readings from Last 1 Encounters:  05/06/23 5\' 11"  (1.803 m)   Weight:  Wt Readings from Last 1 Encounters:  05/12/23 76.9 kg   Ideal Body Weight:  78.2 kg  BMI:  Body mass index is 23.65 kg/m.  Estimated Nutritional Needs:  Kcal:  1900-2100 kcals  Protein:  110-125 g  Fluid:  1.9 L  Myrtie Cruise MS, RD, LDN Registered Dietitian Clinical Nutrition RD Inpatient Contact Info in Amion

## 2023-05-12 NOTE — Significant Event (Signed)
 Rapid Response Event Note   Reason for Call : Hypoxia  Initial Focused Assessment:  I was notified by nursing staff regarding Johnny Rodriguez' oxygen saturations in the 80s despite increasing oxygen to 10L. Duoneb given. Upon arrival, pt sitting upright in the chair with moderate WOB and accessory muscle use. Very congested strong cough with purulent sputum. BBS Rhonchi with some expiratory wheeze. Stat PCXR ordered. His oxygen saturations improved after some IS and coughing up some sputum. Sats 88-92%. BIPAP ordered prn in case his WOB did not improve.   2045- HR 98 , 144/79, RR 23 with sats 92% on 10L [place] [facility].    Interventions:  -Stat PCXR -Bipap prn if needed    MD Notified: Dr. Janalyn Shy per primary RN Call Time:2027 Arrival Time: 2032 End Time: 2050  Rose Fillers, RN

## 2023-05-12 NOTE — Progress Notes (Signed)
 SATURATION QUALIFICATIONS: (This note is used to comply with regulatory documentation for home oxygen)  Patient Saturations on Room Air at Rest = Not tested  Patient Saturations on Room Air while Ambulating = Not tested  Patient Saturations on 6 Liters of oxygen while Ambulating = 83%  Please briefly explain why patient needs home oxygen: Patient wears oxygen at home at a baseline of 3Liters

## 2023-05-12 NOTE — Progress Notes (Signed)
 PROGRESS NOTE  Johnny Rodriguez  DOB: May 08, 1950  PCP: Rema Fendt, NP WGN:[id number]  DOA: 05/06/2023  LOS: 6 days  Hospital Day: 7  Brief narrative: Gregoire Bennis is a 73 y.o. male with PMH significant for chronic alcoholism, chronic 1 pack/day smoking, HTN, CAD, A-fib, cardiomyopathy, chronic systolic CHF with EF 40 to 45%, COPD on 3 L oxygen at home.  3/26, patient presented to the ED with complaint of shortness of breath, productive cough, hypoxia to 70s. In the ED, patient was noted to have bilateral wheezing with decreased air movement, tachycardia. Labs showed elevated BNP, VBG with pCO2 elevated to over 100 Respiratory virus panel negative Patient was intubated and admitted to ICU Extubated 3/29 Transferred out to [facility] on 3/30  Subjective: Patient was seen and examined this morning.  Elderly Caucasian male. Propped up in bed.  On 6 L oxygen per nasal cannula. O2 sat dropped down to 80s on ambulation on 6 L. Patient wants to go home but understands his frail status and high risk of readmission.  Assessment and plan: Acute on chronic hypoxic and hypercapnic resp failure  Acute COPD exacerbation  Presented with bilateral wheezing, CO2 retention Needed intubation.  Successfully extubated and transferred out out of ICU but still on 6 L oxygen. Continue bronchodilators, continue prednisone, On ambulation, O2 sat dropped to 80s even on 6 L today. Continue to wean down supplemental oxygen  H influenza PNA  Sputum culture obtained on admission grew moderate amount of haemophilus influenza.   Currently on Augmentin 875 mg twice daily  Acute on chronic systolic CHF Lasix 1 dose was given 3 days ago.  Patient still remains short of breath.  I will repeat 1 more dose of IV Lasix 40 mg today Most recent echo from 04/2023 with EF 40 to 45% Metoprolol resumed today  Chronic afib  Was on metoprolol at home.  While in ICU, patient was kept on amiodarone drip This  morning, I decided to stop amiodarone drip and resume Toprol. Continue chronic anticoagulation with Eliquis twice daily.  Impaired mobility Seen by PT.  Home with PT recommended.  Goals of care   Code Status: Do not attempt resuscitation (DNR) PRE-ARREST INTERVENTIONS DESIRED     DVT prophylaxis:  SCDs Start: 05/06/23 1512 apixaban (ELIQUIS) tablet 5 mg   Antimicrobials: Currently on course of Augmentin Fluid: None Consultants: PCCM Family Communication: None at bedside  Status: Inpatient Level of care:  Progressive   Patient is from: Home Needs to continue in-hospital care: Remains dyspneic Anticipated d/c to: Hopefully home in 1 to 2 days    Diet:  Diet Order             Diet regular Room service appropriate? Yes; Fluid consistency: Thin  Diet effective now                   Scheduled Meds:  amoxicillin-clavulanate  1 tablet Oral Q12H   apixaban  5 mg Oral BID   aspirin EC  81 mg Oral Daily   Chlorhexidine Gluconate Cloth  6 each Topical Daily   famotidine  20 mg Oral Daily   feeding supplement  237 mL Oral BID BM   fluticasone furoate-vilanterol  1 puff Inhalation Daily   folic acid  1 mg Oral Daily   gabapentin  400 mg Oral TID   linaclotide  145 mcg Oral QAC breakfast   metoprolol succinate  50 mg Oral Daily   predniSONE  40 mg Oral Q  breakfast   thiamine  100 mg Oral Daily   umeclidinium bromide  1 puff Inhalation Daily    PRN meds: albuterol, ondansetron (ZOFRAN) IV, pantoprazole, polyethylene glycol, senna, traZODone   Infusions:    Antimicrobials: Anti-infectives (From admission, onward)    Start     Dose/Rate Route Frequency Ordered Stop   05/09/23 1215  amoxicillin-clavulanate (AUGMENTIN) 875-125 MG per tablet 1 tablet        1 tablet Oral Every 12 hours 05/09/23 1120 05/15/23 0959   05/08/23 1100  doxycycline (VIBRAMYCIN) 100 mg in sodium chloride 0.9 % 250 mL IVPB  Status:  Discontinued        100 mg 125 mL/hr over 120 Minutes  Intravenous Every 12 hours 05/08/23 1006 05/09/23 1116   05/07/23 1415  ceFEPIme (MAXIPIME) 2 g in sodium chloride 0.9 % 100 mL IVPB  Status:  Discontinued        2 g 200 mL/hr over 30 Minutes Intravenous Every 12 hours 05/07/23 1324 05/09/23 1120   05/06/23 1530  cefTRIAXone (ROCEPHIN) 2 g in sodium chloride 0.9 % 100 mL IVPB  Status:  Discontinued        2 g 200 mL/hr over 30 Minutes Intravenous Every 24 hours 05/06/23 1519 05/07/23 1317   05/06/23 1530  azithromycin (ZITHROMAX) 500 mg in sodium chloride 0.9 % 250 mL IVPB  Status:  Discontinued        500 mg 250 mL/hr over 60 Minutes Intravenous Every 24 hours 05/06/23 1519 05/08/23 1458       Objective: Vitals:   05/12/23 0744 05/12/23 1114  BP: (!) 110/92 108/69  Pulse: 91 85  Resp: 17 (!) 25  Temp: 98.2 F (36.8 C) 98.2 F (36.8 C)  SpO2: (!) 83% 93%    Intake/Output Summary (Last 24 hours) at 05/12/2023 1439 Last data filed at 05/12/2023 1100 Gross per 24 hour  Intake 360 ml  Output 1100 ml  Net -740 ml   Filed Weights   05/10/23 0336 05/11/23 0504 05/12/23 0744  Weight: 75.7 kg 77.1 kg 76.9 kg   Weight change:  Body mass index is 23.65 kg/m.   Physical Exam: General exam: Pleasant, elderly Skin: No rashes, lesions or ulcers. HEENT: Atraumatic, normocephalic, no obvious bleeding Lungs: Clear to auscultation bilaterally, mild bilateral scattered wheezing present CVS: S1, S2, no murmur,   GI/Abd: Soft, nontender, nondistended, bowel sound present,   CNS: Alert, awake, oriented x 3 Psychiatry: Sad affect Extremities: No pedal edema, no calf tenderness,   Data Review: I have personally reviewed the laboratory data and studies available.  F/u labs ordered Unresulted Labs (From admission, onward)     Start     Ordered   05/09/23 0500  CBC  Daily,   R     Question:  Specimen collection method  Answer:  Unit=Unit collect   05/08/23 0959   05/09/23 0500  Basic metabolic panel with GFR  Daily,   R     Question:   Specimen collection method  Answer:  Unit=Unit collect   05/08/23 0959   05/09/23 0500  Magnesium  Daily,   R     Question:  Specimen collection method  Answer:  Unit=Unit collect   05/08/23 0959             Total time spent in review of labs and imaging, patient evaluation, formulation of plan, documentation and communication with family: 55 minutes  Signed, Lorin Glass, MD [facility] 05/12/2023

## 2023-05-12 NOTE — Progress Notes (Signed)
 Patient worried about paying his rent today. Patient gave phone number to his apartment, [address] [phone]. Apartment called by CM and was informed that patient is up to date on rent and has a $0 balance for April. RN relayed this information to patient and he stated he understood.

## 2023-05-13 ENCOUNTER — Inpatient Hospital Stay (HOSPITAL_COMMUNITY)

## 2023-05-13 DIAGNOSIS — J9621 Acute and chronic respiratory failure with hypoxia: Secondary | ICD-10-CM | POA: Diagnosis not present

## 2023-05-13 LAB — BASIC METABOLIC PANEL WITH GFR
BUN: 24 mg/dL — ABNORMAL HIGH (ref 8–23)
CO2: >45 mmol/L — ABNORMAL HIGH (ref 22–32)
Calcium: 9.1 mg/dL (ref 8.9–10.3)
Chloride: 83 mmol/L — ABNORMAL LOW (ref 98–111)
Creatinine, Ser: 0.82 mg/dL (ref 0.61–1.24)
GFR, Estimated: >60 mL/min (ref >60)
Glucose, Bld: 154 mg/dL — ABNORMAL HIGH (ref 70–99)
Potassium: 4.6 mmol/L (ref 3.5–5.1)
Sodium: 138 mmol/L (ref 135–145)

## 2023-05-13 LAB — CBC
HCT: 42.1 % (ref 39.0–52.0)
Hemoglobin: 12.6 g/dL — ABNORMAL LOW (ref 13.0–17.0)
MCH: 30.6 pg (ref 26.0–34.0)
MCHC: 29.9 g/dL — ABNORMAL LOW (ref 30.0–36.0)
MCV: 102.2 fL — ABNORMAL HIGH (ref 80.0–100.0)
Platelets: 225 10*3/uL (ref 150–400)
RBC: 4.12 MIL/uL — ABNORMAL LOW (ref 4.22–5.81)
RDW: 13.3 % (ref 11.5–15.5)
WBC: 13.6 10*3/uL — ABNORMAL HIGH (ref 4.0–10.5)
nRBC: 0 % (ref 0.0–0.2)

## 2023-05-13 LAB — MAGNESIUM: Magnesium: 1.9 mg/dL (ref 1.7–2.4)

## 2023-05-13 LAB — TROPONIN I (HIGH SENSITIVITY): Troponin I (High Sensitivity): 7 ng/L (ref <18)

## 2023-05-13 MED ORDER — FUROSEMIDE 10 MG/ML IJ SOLN
40.0000 mg | Freq: Once | INTRAMUSCULAR | Status: AC
  Administered 2023-05-13: 40 mg via INTRAVENOUS
  Filled 2023-05-13: qty 4

## 2023-05-13 MED ORDER — IOHEXOL 350 MG/ML SOLN
75.0000 mL | Freq: Once | INTRAVENOUS | Status: AC | PRN
  Administered 2023-05-13: 75 mL via INTRAVENOUS

## 2023-05-13 NOTE — Progress Notes (Signed)
 PROGRESS NOTE  Johnny Rodriguez  DOB: 11/17/1950  PCP: Rema Fendt, NP EAV:[id number]  DOA: 05/06/2023  LOS: 7 days  Hospital Day: 8  Brief narrative: Johnny Rodriguez is a 73 y.o. male with PMH significant for chronic alcoholism, chronic 1 pack/day smoking, HTN, CAD, A-fib, cardiomyopathy, chronic systolic CHF with EF 40 to 45%, COPD on 3 L oxygen at home.  3/26, patient presented to the ED with complaint of shortness of breath, productive cough, hypoxia to 70s. In the ED, patient was noted to have bilateral wheezing with decreased air movement, tachycardia. Labs showed elevated BNP, VBG with pCO2 elevated to over 100 Respiratory virus panel negative Patient was intubated and admitted to ICU Extubated 3/29 Transferred out to [facility] on 3/30  Subjective: Patient was seen and examined this morning. Events from last night noted. Patient was short of breath requiring 10 L of oxygen.  Chest x-ray did not show any infiltrates or edema.  Offered BiPAP which patient declined. On 6 lpm O2 at rest this am.   Assessment and plan: Acute on chronic hypoxic and hypercapnic resp failure  Acute COPD exacerbation  Presented with bilateral wheezing, CO2 retention Needed intubation.  Successfully extubated and transferred out out of ICU.  Oxygen requirement still high, got worse in last 24 hours. Will give 1 dose of IV Lasix today and obtain CT angio chest. Continue bronchodilators, continue prednisone, Continue to wean down supplemental oxygen  H influenza PNA  Sputum culture obtained on admission grew moderate amount of haemophilus influenza.   Currently on Augmentin 875 mg twice daily  Acute on chronic systolic CHF Getting intermittent IV Lasix Most recent echo from 04/30/2023 with EF 40 to 45% Given his shortness of breath, need to rule out a new cardiac event leading to LV decompensation.  Obtain troponin Continue metoprolol  Chronic afib  Was on metoprolol at home.  While in ICU,  patient was kept on amiodarone drip, stopped on 4/1 Currently on metoprolol Continue chronic anticoagulation with Eliquis twice daily.  Impaired mobility Seen by PT.  Home with PT recommended.  Goals of care   Code Status: Do not attempt resuscitation (DNR) PRE-ARREST INTERVENTIONS DESIRED     DVT prophylaxis:  SCDs Start: 05/06/23 1512 apixaban (ELIQUIS) tablet 5 mg   Antimicrobials: Currently on a course of Augmentin Fluid: None Consultants: PCCM Family Communication: None at bedside  Status: Inpatient Level of care:  Progressive   Patient is from: Home Needs to continue in-hospital care: Remains dyspneic.  Ordered CT angio chest and troponin Anticipated d/c to: Hopefully home in 1 to 2 days    Diet:  Diet Order             Diet regular Room service appropriate? Yes; Fluid consistency: Thin  Diet effective now                   Scheduled Meds:  amoxicillin-clavulanate  1 tablet Oral Q12H   apixaban  5 mg Oral BID   aspirin EC  81 mg Oral Daily   Chlorhexidine Gluconate Cloth  6 each Topical Daily   famotidine  20 mg Oral Daily   feeding supplement  237 mL Oral BID BM   fluticasone furoate-vilanterol  1 puff Inhalation Daily   folic acid  1 mg Oral Daily   gabapentin  400 mg Oral TID   linaclotide  145 mcg Oral QAC breakfast   metoprolol succinate  50 mg Oral Daily   multivitamin with minerals  1 tablet Oral Daily   predniSONE  40 mg Oral Q breakfast   saccharomyces boulardii  250 mg Oral BID   thiamine  100 mg Oral Daily   umeclidinium bromide  1 puff Inhalation Daily    PRN meds: albuterol, ondansetron (ZOFRAN) IV, pantoprazole, polyethylene glycol, senna, traZODone   Infusions:    Antimicrobials: Anti-infectives (From admission, onward)    Start     Dose/Rate Route Frequency Ordered Stop   05/09/23 1215  amoxicillin-clavulanate (AUGMENTIN) 875-125 MG per tablet 1 tablet        1 tablet Oral Every 12 hours 05/09/23 1120 05/15/23 0959    05/08/23 1100  doxycycline (VIBRAMYCIN) 100 mg in sodium chloride 0.9 % 250 mL IVPB  Status:  Discontinued        100 mg 125 mL/hr over 120 Minutes Intravenous Every 12 hours 05/08/23 1006 05/09/23 1116   05/07/23 1415  ceFEPIme (MAXIPIME) 2 g in sodium chloride 0.9 % 100 mL IVPB  Status:  Discontinued        2 g 200 mL/hr over 30 Minutes Intravenous Every 12 hours 05/07/23 1324 05/09/23 1120   05/06/23 1530  cefTRIAXone (ROCEPHIN) 2 g in sodium chloride 0.9 % 100 mL IVPB  Status:  Discontinued        2 g 200 mL/hr over 30 Minutes Intravenous Every 24 hours 05/06/23 1519 05/07/23 1317   05/06/23 1530  azithromycin (ZITHROMAX) 500 mg in sodium chloride 0.9 % 250 mL IVPB  Status:  Discontinued        500 mg 250 mL/hr over 60 Minutes Intravenous Every 24 hours 05/06/23 1519 05/08/23 1458       Objective: Vitals:   05/13/23 0738 05/13/23 0859  BP:  109/78  Pulse: 87 94  Resp: (!) 22   Temp:    SpO2: 94%     Intake/Output Summary (Last 24 hours) at 05/13/2023 0943 Last data filed at 05/13/2023 1610 Gross per 24 hour  Intake 680 ml  Output 2200 ml  Net -1520 ml   Filed Weights   05/11/23 0504 05/12/23 0744 05/13/23 0246  Weight: 77.1 kg 76.9 kg 78.7 kg   Weight change:  Body mass index is 24.21 kg/m.   Physical Exam: General exam: Pleasant, elderly Caucasian male Skin: No rashes, lesions or ulcers. HEENT: Atraumatic, normocephalic, no obvious bleeding Lungs: mild bilateral scattered wheezing present CVS: S1, S2, no murmur,   GI/Abd: Soft, nontender, nondistended, bowel sound present,   CNS: Alert, awake, oriented x 3 Psychiatry: Mood appropriate Extremities: No pedal edema, no calf tenderness,   Data Review: I have personally reviewed the laboratory data and studies available.  F/u labs ordered Unresulted Labs (From admission, onward)    None       Total time spent in review of labs and imaging, patient evaluation, formulation of plan, documentation and  communication with family: 55 minutes  Signed, Lorin Glass, MD [facility] 05/13/2023

## 2023-05-13 NOTE — Progress Notes (Signed)
 Patient transported into to RM 06. Alert and oriented, settled in bed. Report given to oncoming RN at this time.

## 2023-05-13 NOTE — Plan of Care (Signed)

## 2023-05-14 DIAGNOSIS — J9601 Acute respiratory failure with hypoxia: Secondary | ICD-10-CM | POA: Diagnosis not present

## 2023-05-14 DIAGNOSIS — E440 Moderate protein-calorie malnutrition: Secondary | ICD-10-CM | POA: Diagnosis not present

## 2023-05-14 DIAGNOSIS — I2699 Other pulmonary embolism without acute cor pulmonale: Secondary | ICD-10-CM

## 2023-05-14 DIAGNOSIS — J9602 Acute respiratory failure with hypercapnia: Secondary | ICD-10-CM

## 2023-05-14 DIAGNOSIS — J441 Chronic obstructive pulmonary disease with (acute) exacerbation: Secondary | ICD-10-CM | POA: Diagnosis not present

## 2023-05-14 MED ORDER — ACETAZOLAMIDE 250 MG PO TABS
250.0000 mg | ORAL_TABLET | Freq: Two times a day (BID) | ORAL | Status: AC
  Administered 2023-05-14 (×2): 250 mg via ORAL
  Filled 2023-05-14 (×2): qty 1

## 2023-05-14 MED ORDER — IPRATROPIUM BROMIDE 0.02 % IN SOLN
0.5000 mg | Freq: Three times a day (TID) | RESPIRATORY_TRACT | Status: DC
  Administered 2023-05-14 – 2023-05-16 (×6): 0.5 mg via RESPIRATORY_TRACT
  Filled 2023-05-14 (×7): qty 2.5

## 2023-05-14 MED ORDER — ARFORMOTEROL TARTRATE 15 MCG/2ML IN NEBU
15.0000 ug | INHALATION_SOLUTION | Freq: Two times a day (BID) | RESPIRATORY_TRACT | Status: DC
Start: 2023-05-14 — End: 2023-05-16
  Administered 2023-05-14 – 2023-05-16 (×5): 15 ug via RESPIRATORY_TRACT
  Filled 2023-05-14 (×5): qty 2

## 2023-05-14 MED ORDER — LEVALBUTEROL HCL 0.63 MG/3ML IN NEBU
0.6300 mg | INHALATION_SOLUTION | Freq: Three times a day (TID) | RESPIRATORY_TRACT | Status: DC
  Administered 2023-05-14 – 2023-05-16 (×6): 0.63 mg via RESPIRATORY_TRACT
  Filled 2023-05-14 (×7): qty 3

## 2023-05-14 MED ORDER — REVEFENACIN 175 MCG/3ML IN SOLN
175.0000 ug | Freq: Every day | RESPIRATORY_TRACT | Status: DC
  Administered 2023-05-14 – 2023-05-15 (×2): 175 ug via RESPIRATORY_TRACT
  Filled 2023-05-14 (×3): qty 3

## 2023-05-14 MED ORDER — BUDESONIDE 0.25 MG/2ML IN SUSP
0.2500 mg | Freq: Two times a day (BID) | RESPIRATORY_TRACT | Status: DC
  Administered 2023-05-14 – 2023-05-15 (×4): 0.25 mg via RESPIRATORY_TRACT
  Filled 2023-05-14 (×6): qty 2

## 2023-05-14 NOTE — Progress Notes (Signed)
 PROGRESS NOTE        PATIENT DETAILS Name: Johnny Rodriguez Age: 73 y.o. Sex: male Date of Birth: 12-05-1950 Admit Date: 05/06/2023 Admitting Physician Lynnell Catalan, MD ZOX:[id number], Amy J, NP  Brief Summary: Patient is a 73 y.o.  male with history of COPD on 3 L of oxygen at home, HFrEF, PAF, HTN-who presented with shortness of breath-secondary to COPD exacerbation in the setting of H. influenzae pneumonia-initially placed on BiPAP-but continued to worsen-subsequently admitted to the ICU.  He was stabilized-extubated and subsequently transferred to [facility].  Significant events: 3/26>> presented with shortness of breath-intubated-admit to ICU 3/28>> extubated 3/30>> transferred to [facility]  Significant studies: 3/20>> echo: EF 40-45% 3/26>> CXR: COPD-no obvious infiltrates. 4/02>> CTA chest: Right lower lobe PE  Significant microbiology data: 3/26>> COVID/influenza/RSV PCR: Negative 3/26>> respiratory virus panel: Negative 3/26>> tracheal aspirate: Haemophilus influenza A  Procedures: 3/26-3/28 >>ETT  Consults: PCCM  Subjective: Minimally short of breath at the end of long sentences-anxious-wanting to leave the hospital.  Not in obvious distress.  Long conversation-explained importance of remaining hospitalized-understands life-threatening/life disabling risks of leaving AGAINST MEDICAL ADVICE.  Objective: Vitals: Blood pressure 116/70, pulse 90, temperature 98 F (36.7 C), temperature source Oral, resp. rate (!) 22, height 5\' 11"  (1.803 m), weight 79.8 kg, SpO2 92%.   Exam: Gen Exam:Alert awake-not in any distress HEENT:atraumatic, normocephalic Chest: Diminished air entry-some scattered rhonchi. CVS:S1S2 regular Abdomen:soft non tender, non distended Extremities:no edema Neurology: Non focal Skin: no rash  Pertinent Labs/Radiology:    Latest Ref Rng & Units 05/13/2023    2:35 AM 05/12/2023    4:19 AM 05/11/2023    5:30 AM  CBC  WBC 4.0 -  10.5 K/uL 13.6  16.1  18.9   Hemoglobin 13.0 - 17.0 g/dL 98.1  19.1  47.8   Hematocrit 39.0 - 52.0 % 42.1  41.3  45.5   Platelets 150 - 400 K/uL 225  221  274     Lab Results  Component Value Date   NA 138 05/13/2023   K 4.6 05/13/2023   CL 83 (L) 05/13/2023   CO2 >45 (H) 05/13/2023      Assessment/Plan: Acute on chronic respiratory failure secondary to COPD exacerbation from H influenza PNA Intubated on initial presentation-extubated on 3/28 Slowly improving-still on 8-10 L of HFNC-but not in any distress-significant anxiety component Small PE on CTA chest-not sure if this is contributing to any hypoxia but he is already on anticoagulation. Volume status stable-has developed severe metabolic alkalosis-likely from contraction alkalosis-will try a few doses of Diamox Switch bronchodilators to nebulized form Continue Augmentin. Incentive spirometry/flutter valve/mobilize as much as possible Counseled that this is going to take several days before he can improve enough to go home. Slowly titrate down FiO2 as tolerated.  Chronic atrial fibrillation Rate controlled Continue Eliquis  Acute on chronic HFrEF Now improved and euvolemic -4.1 L so far. Holding Lasix-Diamox for a few days see above.  Known history of PE Small PE seen on CTA chest-not sure if this is a residual PE from prior PE. Already on anticoagulation  Chronic pain syndrome Neurontin  Debility/deconditioning PT/OT-Home health recommended  History of tobacco use Counseled before discharge  Nutrition Status: Nutrition Problem: Moderate Malnutrition Etiology: chronic illness Signs/Symptoms: severe muscle depletion, moderate muscle depletion, moderate fat depletion Interventions: Refer to RD note for recommendations  BMI: Estimated body  mass index is 24.54 kg/m as calculated from the following:   Height as of this encounter: 5\' 11"  (1.803 m).   Weight as of this encounter: 79.8 kg.   Code status:    Code Status: Do not attempt resuscitation (DNR) PRE-ARREST INTERVENTIONS DESIRED   DVT Prophylaxis: SCDs Start: 05/06/23 1512 apixaban (ELIQUIS) tablet 5 mg    Family Communication: None at bedside   Disposition Plan: Status is: Inpatient Remains inpatient appropriate because: Severity of illness   Planned Discharge Destination:Home health   Diet: Diet Order             Diet regular Room service appropriate? Yes; Fluid consistency: Thin  Diet effective now                     Antimicrobial agents: Anti-infectives (From admission, onward)    Start     Dose/Rate Route Frequency Ordered Stop   05/09/23 1215  amoxicillin-clavulanate (AUGMENTIN) 875-125 MG per tablet 1 tablet        1 tablet Oral Every 12 hours 05/09/23 1120 05/15/23 0959   05/08/23 1100  doxycycline (VIBRAMYCIN) 100 mg in sodium chloride 0.9 % 250 mL IVPB  Status:  Discontinued        100 mg 125 mL/hr over 120 Minutes Intravenous Every 12 hours 05/08/23 1006 05/09/23 1116   05/07/23 1415  ceFEPIme (MAXIPIME) 2 g in sodium chloride 0.9 % 100 mL IVPB  Status:  Discontinued        2 g 200 mL/hr over 30 Minutes Intravenous Every 12 hours 05/07/23 1324 05/09/23 1120   05/06/23 1530  cefTRIAXone (ROCEPHIN) 2 g in sodium chloride 0.9 % 100 mL IVPB  Status:  Discontinued        2 g 200 mL/hr over 30 Minutes Intravenous Every 24 hours 05/06/23 1519 05/07/23 1317   05/06/23 1530  azithromycin (ZITHROMAX) 500 mg in sodium chloride 0.9 % 250 mL IVPB  Status:  Discontinued        500 mg 250 mL/hr over 60 Minutes Intravenous Every 24 hours 05/06/23 1519 05/08/23 1458        MEDICATIONS: Scheduled Meds:  acetaZOLAMIDE  250 mg Oral BID   amoxicillin-clavulanate  1 tablet Oral Q12H   apixaban  5 mg Oral BID   arformoterol  15 mcg Nebulization BID   aspirin EC  81 mg Oral Daily   budesonide (PULMICORT) nebulizer solution  0.25 mg Nebulization BID   Chlorhexidine Gluconate Cloth  6 each Topical Daily    famotidine  20 mg Oral Daily   feeding supplement  237 mL Oral BID BM   folic acid  1 mg Oral Daily   gabapentin  400 mg Oral TID   levalbuterol  0.63 mg Nebulization TID   And   ipratropium  0.5 mg Nebulization TID   linaclotide  145 mcg Oral QAC breakfast   metoprolol succinate  50 mg Oral Daily   multivitamin with minerals  1 tablet Oral Daily   revefenacin  175 mcg Nebulization Daily   saccharomyces boulardii  250 mg Oral BID   thiamine  100 mg Oral Daily   Continuous Infusions: PRN Meds:.albuterol, ondansetron (ZOFRAN) IV, pantoprazole, polyethylene glycol, senna, traZODone   I have personally reviewed following labs and imaging studies  LABORATORY DATA: CBC: Recent Labs  Lab 05/09/23 0439 05/10/23 0545 05/11/23 0530 05/12/23 0419 05/13/23 0235  WBC 17.0* 15.6* 18.9* 16.1* 13.6*  HGB 13.1 13.1 13.6 12.6* 12.6*  HCT 42.5 44.5 45.5  41.3 42.1  MCV 102.7* 104.2* 104.6* 102.2* 102.2*  PLT 268 273 274 221 225    Basic Metabolic Panel: Recent Labs  Lab 05/07/23 1652 05/07/23 1920 05/08/23 0425 05/09/23 0439 05/10/23 0654 05/11/23 0530 05/12/23 0419 05/13/23 0235  NA  --    < >  --  141 143 141 139 138  K  --    < >  --  4.7 4.5 5.2* 4.8 4.6  CL  --    < >  --  99 93* 94* 89* 83*  CO2  --    < >  --  36* >45* 41* 45* >45*  GLUCOSE  --    < >  --  157* 120* 178* 154* 154*  BUN  --    < >  --  45* 37* 35* 25* 24*  CREATININE  --    < >  --  1.01 0.95 0.84 0.70 0.82  CALCIUM  --    < >  --  8.3* 9.4 9.5 9.0 9.1  MG 2.5*  --  2.5* 2.1 2.3 2.2 1.9 1.9  PHOS 3.4  --  3.6  --   --   --   --   --    < > = values in this interval not displayed.    GFR: Estimated Creatinine Clearance: 88 mL/min (by C-G formula based on SCr of 0.82 mg/dL).  Liver Function Tests: Recent Labs  Lab 05/07/23 1920  AST 166*  ALT 517*  ALKPHOS 52  BILITOT 0.7  PROT 6.3*  ALBUMIN 2.4*   No results for input(s): "LIPASE", "AMYLASE" in the last 168 hours. No results for input(s):  "AMMONIA" in the last 168 hours.  Coagulation Profile: No results for input(s): "INR", "PROTIME" in the last 168 hours.  Cardiac Enzymes: No results for input(s): "CKTOTAL", "CKMB", "CKMBINDEX", "TROPONINI" in the last 168 hours.  BNP (last 3 results) No results for input(s): "PROBNP" in the last 8760 hours.  Lipid Profile: No results for input(s): "CHOL", "HDL", "LDLCALC", "TRIG", "CHOLHDL", "LDLDIRECT" in the last 72 hours.  Thyroid Function Tests: No results for input(s): "TSH", "T4TOTAL", "FREET4", "T3FREE", "THYROIDAB" in the last 72 hours.  Anemia Panel: No results for input(s): "VITAMINB12", "FOLATE", "FERRITIN", "TIBC", "IRON", "RETICCTPCT" in the last 72 hours.  Urine analysis:    Component Value Date/Time   COLORURINE AMBER (A) 05/06/2023 1815   APPEARANCEUR HAZY (A) 05/06/2023 1815   LABSPEC 1.017 05/06/2023 1815   PHURINE 5.0 05/06/2023 1815   GLUCOSEU 50 (A) 05/06/2023 1815   HGBUR SMALL (A) 05/06/2023 1815   BILIRUBINUR NEGATIVE 05/06/2023 1815   KETONESUR 5 (A) 05/06/2023 1815   PROTEINUR 100 (A) 05/06/2023 1815   UROBILINOGEN 0.2 12/08/2012 1516   NITRITE NEGATIVE 05/06/2023 1815   LEUKOCYTESUR NEGATIVE 05/06/2023 1815    Sepsis Labs: Lactic Acid, Venous    Component Value Date/Time   LATICACIDVEN 4.1 (HH) 05/06/2023 1907    MICROBIOLOGY: Recent Results (from the past 240 hours)  Resp panel by RT-PCR (RSV, Flu A&B, Covid) Anterior Nasal Swab     Status: None   Collection Time: 05/06/23  2:00 PM   Specimen: Anterior Nasal Swab  Result Value Ref Range Status   SARS Coronavirus 2 by RT PCR NEGATIVE NEGATIVE Final   Influenza A by PCR NEGATIVE NEGATIVE Final   Influenza B by PCR NEGATIVE NEGATIVE Final    Comment: (NOTE) The Xpert Xpress SARS-CoV-2/FLU/RSV plus assay is intended as an aid in the diagnosis of influenza from  Nasopharyngeal swab specimens and should not be used as a sole basis for treatment. Nasal washings and aspirates are  unacceptable for Xpert Xpress SARS-CoV-2/FLU/RSV testing.  Fact Sheet for Patients: BloggerCourse.com  Fact Sheet for Healthcare Providers: SeriousBroker.it  This test is not yet approved or cleared by the [place] FDA and has been authorized for detection and/or diagnosis of SARS-CoV-2 by FDA under an Emergency Use Authorization (EUA). This EUA will remain in effect (meaning this test can be used) for the duration of the COVID-19 declaration under Section 564(b)(1) of the Act, 21 U.S.C. section 360bbb-3(b)(1), unless the authorization is terminated or revoked.     Resp Syncytial Virus by PCR NEGATIVE NEGATIVE Final    Comment: (NOTE) Fact Sheet for Patients: BloggerCourse.com  Fact Sheet for Healthcare Providers: SeriousBroker.it  This test is not yet approved or cleared by the [place] FDA and has been authorized for detection and/or diagnosis of SARS-CoV-2 by FDA under an Emergency Use Authorization (EUA). This EUA will remain in effect (meaning this test can be used) for the duration of the COVID-19 declaration under Section 564(b)(1) of the Act, 21 U.S.C. section 360bbb-3(b)(1), unless the authorization is terminated or revoked.  Performed at [facility], [address] [place], [place] [address]   MRSA Next Gen by PCR, Nasal     Status: None   Collection Time: 05/06/23  5:15 PM   Specimen: Nasal Mucosa; Nasal Swab  Result Value Ref Range Status   MRSA by PCR Next Gen NOT DETECTED NOT DETECTED Final    Comment: (NOTE) The GeneXpert MRSA Assay (FDA approved for NASAL specimens only), is one component of a comprehensive MRSA colonization surveillance program. It is not intended to diagnose MRSA infection nor to guide or monitor treatment for MRSA infections. Test performance is not FDA approved in patients less than 77 years old. Performed at [facility], [address] [place], [place] [address]   Culture, Respiratory w Gram Stain     Status: None   Collection Time: 05/06/23  9:36 PM   Specimen: Tracheal Aspirate; Respiratory  Result Value Ref Range Status   Specimen Description TRACHEAL ASPIRATE  Final   Special Requests Normal  Final   Gram Stain   Final    MODERATE WBC PRESENT, PREDOMINANTLY PMN MODERATE GRAM NEGATIVE RODS FEW GRAM POSITIVE COCCI IN PAIRS    Culture   Final    MODERATE HAEMOPHILUS INFLUENZAE BETA LACTAMASE POSITIVE Performed at [facility], [address], [place], [place] [address]    Report Status 05/09/2023 FINAL  Final  Respiratory (~20 pathogens) panel by PCR     Status: None   Collection Time: 05/06/23  9:36 PM   Specimen: Nasopharyngeal Swab; Respiratory  Result Value Ref Range Status   Adenovirus NOT DETECTED NOT DETECTED Final   Coronavirus 229E NOT DETECTED NOT DETECTED Final    Comment: (NOTE) The Coronavirus on the Respiratory Panel, DOES NOT test for the novel  Coronavirus (2019 nCoV)    Coronavirus HKU1 NOT DETECTED NOT DETECTED Final   Coronavirus NL63 NOT DETECTED NOT DETECTED Final   Coronavirus OC43 NOT DETECTED NOT DETECTED Final   Metapneumovirus NOT DETECTED NOT DETECTED Final   Rhinovirus / Enterovirus NOT DETECTED NOT DETECTED Final   Influenza A NOT DETECTED NOT DETECTED Final   Influenza B NOT DETECTED NOT DETECTED Final   Parainfluenza Virus 1 NOT DETECTED NOT DETECTED Final   Parainfluenza Virus 2 NOT DETECTED NOT DETECTED Final  Parainfluenza Virus 3 NOT DETECTED NOT DETECTED Final   Parainfluenza Virus 4 NOT DETECTED NOT DETECTED Final   Respiratory Syncytial Virus NOT DETECTED NOT DETECTED Final   Bordetella pertussis NOT DETECTED NOT DETECTED Final   Bordetella Parapertussis NOT DETECTED NOT DETECTED Final   Chlamydophila pneumoniae NOT DETECTED NOT DETECTED Final   Mycoplasma pneumoniae NOT DETECTED NOT DETECTED Final    Comment: Performed at  [facility], [address] [place], [place] [address]    RADIOLOGY STUDIES/RESULTS: CT Angio Chest Pulmonary Embolism (PE) W or WO Contrast Result Date: 05/13/2023 CLINICAL DATA:  Hypoxia EXAM: CT ANGIOGRAPHY CHEST WITH CONTRAST TECHNIQUE: Multidetector CT imaging of the chest was performed using the standard protocol during bolus administration of intravenous contrast. Multiplanar CT image reconstructions and MIPs were obtained to evaluate the vascular anatomy. RADIATION DOSE REDUCTION: This exam was performed according to the departmental dose-optimization program which includes automated exposure control, adjustment of the mA and/or kV according to patient size and/or use of iterative reconstruction technique. CONTRAST:  75mL OMNIPAQUE IOHEXOL 350 MG/ML SOLN COMPARISON:  CTA 03/27/2022. FINDINGS: Cardiovascular: Heart is enlarged. Small pericardial effusion, increased from prior CTA. Coronary artery calcifications are seen. The thoracic aorta has a normal course and caliber with some atherosclerotic partially calcified plaque. Some enlargement of the main pulmonary arteries. Please correlate for evidence of pulmonary hypertension. Also some subtle filling defect identified in the right lower lobe central pulmonary artery and some peripheral areas along the posteromedial right lower lobe. Subtle pulmonary emboli. Some of the changes could be mixing artifact as well. Distribution is similar to previous but appears more so than on the prior. Right IJ line in place. Mediastinum/Nodes: Preserved thyroid gland. Slightly patulous thoracic esophagus. No specific abnormal lymph node enlargement present in the axillary region, hilum or mediastinum. Lungs/Pleura: Small left and tiny right pleural effusions are identified. Diffuse peripheral interstitial septal thickening identified with some nodular contour to the thickening. Tree-in-bud like changes. There is significant areas of mucous plugging identified in  the right lower lobe. Subtle areas of atelectasis seen in the lingula dependently. Additional underlying centrilobular emphysematous changes. Upper Abdomen: Stable nodular thickening of the adrenal glands, left greater than right. Musculoskeletal: Curvature of the spine with scattered degenerative changes. Stable mild compression deformity at of T6. multiple healed left-sided rib fractures. Review of the MIP images confirms the above findings. Critical Value/emergent results were called by telephone at the time of interpretation on 05/13/2023 at 10:59 am to provider [facility] , who verbally acknowledged these results. IMPRESSION: Small area of filling defect along the right lower lobe consistent with pulmonary emboli. Some of this could be mixing artifact as well. Minimal clot burden. Similar distribution to prior embolus but different configuration and slightly more than prior. Enlarged heart with small pericardial effusion. Coronary artery calcifications. Enlargement of the pulmonary arteries. Please correlate for any evidence of pulmonary hypertension. Emphysematous lung changes. Persistent areas of interstitial septal thickening with tree-in-bud like changes. Aortic Atherosclerosis (ICD10-I70.0) and Emphysema (ICD10-J43.9). Electronically Signed   By: Karen Kays M.D.   On: 05/13/2023 11:08   DG Chest Port 1 View Result Date: 05/12/2023 CLINICAL DATA:  [id number] Acute respiratory distress [id number] EXAM: PORTABLE CHEST 1 VIEW COMPARISON:  Chest x-ray 05/08/2023 trauma CT angio chest 01/25/2023 FINDINGS: Right chest wall likely subclavian venous catheter with tip overlying the distal superior vena cava. The heart and mediastinal contours are unchanged. Atherosclerotic plaque. Hyperinflation of the lungs. No focal consolidation. Chronic coarsened interstitial markings with  no overt pulmonary edema. Blunting of bilateral costophrenic angles with possible bilateral trace pleural effusions. No pneumothorax. No acute  osseous abnormality. IMPRESSION: 1. Blunting of bilateral costophrenic angles with possible bilateral trace pleural effusions. 2. Aortic Atherosclerosis (ICD10-I70.0) and Emphysema (ICD10-J43.9). Electronically Signed   By: Tish Frederickson M.D.   On: 05/12/2023 20:59     LOS: 8 days   Jeoffrey Massed, MD  [facility]    To contact the attending provider between 7A-7P or the covering provider during after hours 7P-7A, please log into the web site [email or web address] and access using universal [place] password for that web site. If you do not have the password, please call the hospital operator.  05/14/2023, 10:06 AM

## 2023-05-14 NOTE — Progress Notes (Signed)
   05/14/23 1240  Mobility  Activity Ambulated with assistance in hallway  Level of Assistance Standby assist, set-up cues, supervision of patient - no hands on  Assistive Device Front wheel walker  Distance Ambulated (ft) 150 ft  Activity Response Tolerated fair  Mobility Referral Yes  Mobility visit 1 Mobility  Mobility Specialist Start Time (ACUTE ONLY) 1213  Mobility Specialist Stop Time (ACUTE ONLY) 1240  Mobility Specialist Time Calculation (min) (ACUTE ONLY) 27 min   Mobility Specialist: Progress Note - Visits:2  Pre-Mobility:      HR 83, SpO2 91% During Mobility:             SpO2 82-88% 8L Post-Mobility:    HR 78, SpO2 97% 8L  Pt agreeable to mobility session - received in chair. Pt de-sating during mobility stating he was "okay." Pt had no complaints. Returned to chair with all needs met - call bell within reach.   Pre-Mobility:      HR 80s, SpO2 90% 8L Post-Mobility:    HR 90s, SpO2 93% 8L  Pt agreeable to mobility session - received in chair. SPO2 reading not visible during ambulation - RN notified via securechat. Pt  stating he felt "great." Pt had no complaints.  Returned to chair with all needs met - call bell within reach.   Barnie Mort, BS Mobility Specialist Please contact via SecureChat or  Rehab office at [phone].

## 2023-05-14 NOTE — Plan of Care (Signed)
  Problem: Education: Goal: Knowledge of General Education information will improve Description: Including pain rating scale, medication(s)/side effects and non-pharmacologic comfort measures Outcome: Not Progressing   Problem: Health Behavior/Discharge Planning: Goal: Ability to manage health-related needs will improve Outcome: Not Progressing   Problem: Clinical Measurements: Goal: Ability to maintain clinical measurements within normal limits will improve Outcome: Progressing   Problem: Elimination: Goal: Will not experience complications related to urinary retention Outcome: Progressing

## 2023-05-14 NOTE — Plan of Care (Signed)
  Problem: Education: Goal: Knowledge of General Education information will improve Description: Including pain rating scale, medication(s)/side effects and non-pharmacologic comfort measures Outcome: Progressing   Problem: Clinical Measurements: Goal: Ability to maintain clinical measurements within normal limits will improve Outcome: Progressing Goal: Diagnostic test results will improve Outcome: Progressing Goal: Respiratory complications will improve Outcome: Progressing Goal: Cardiovascular complication will be avoided Outcome: Progressing   Problem: Activity: Goal: Risk for activity intolerance will decrease Outcome: Progressing   Problem: Nutrition: Goal: Adequate nutrition will be maintained Outcome: Progressing   Problem: Coping: Goal: Level of anxiety will decrease Outcome: Progressing

## 2023-05-15 DIAGNOSIS — E440 Moderate protein-calorie malnutrition: Secondary | ICD-10-CM | POA: Diagnosis not present

## 2023-05-15 DIAGNOSIS — J441 Chronic obstructive pulmonary disease with (acute) exacerbation: Secondary | ICD-10-CM | POA: Diagnosis not present

## 2023-05-15 DIAGNOSIS — I2699 Other pulmonary embolism without acute cor pulmonale: Secondary | ICD-10-CM | POA: Diagnosis not present

## 2023-05-15 DIAGNOSIS — J9601 Acute respiratory failure with hypoxia: Secondary | ICD-10-CM | POA: Diagnosis not present

## 2023-05-15 LAB — BASIC METABOLIC PANEL WITH GFR
Anion gap: 10 (ref 5–15)
BUN: 31 mg/dL — ABNORMAL HIGH (ref 8–23)
CO2: 36 mmol/L — ABNORMAL HIGH (ref 22–32)
Calcium: 9.5 mg/dL (ref 8.9–10.3)
Chloride: 91 mmol/L — ABNORMAL LOW (ref 98–111)
Creatinine, Ser: 1.03 mg/dL (ref 0.61–1.24)
GFR, Estimated: >60 mL/min (ref >60)
Glucose, Bld: 182 mg/dL — ABNORMAL HIGH (ref 70–99)
Potassium: 4.4 mmol/L (ref 3.5–5.1)
Sodium: 137 mmol/L (ref 135–145)

## 2023-05-15 LAB — CBC
HCT: 44.9 % (ref 39.0–52.0)
Hemoglobin: 13.6 g/dL (ref 13.0–17.0)
MCH: 30.9 pg (ref 26.0–34.0)
MCHC: 30.3 g/dL (ref 30.0–36.0)
MCV: 102 fL — ABNORMAL HIGH (ref 80.0–100.0)
Platelets: 236 10*3/uL (ref 150–400)
RBC: 4.4 MIL/uL (ref 4.22–5.81)
RDW: 13.3 % (ref 11.5–15.5)
WBC: 10.7 10*3/uL — ABNORMAL HIGH (ref 4.0–10.5)
nRBC: 0 % (ref 0.0–0.2)

## 2023-05-15 NOTE — Care Management (Signed)
 Patients oxygen saturation noted to be 83% on 3L [place]. Oxygen increased to 4L with no change in saturations, at 5L [place] he was 85%, increased to 6L [place] with oxygen saturations 88-89% sustained for only 5 minutes and then dropped again to 86%. Oxygen increased once again to 7L [place] high flow with improvement to 90%, will continue to monitor and titrate as appropriate.

## 2023-05-15 NOTE — Progress Notes (Addendum)
 0800 patient sitting to side of bed alert able to make all needs known on 6 L [place]  0900 02 decreased to 5L goal is 88  1800 patient walked in halls today was up to chair and back at baseline oxygen 3L [place]

## 2023-05-15 NOTE — Progress Notes (Signed)
 Physical Therapy Treatment Patient Details Name: Johnny Rodriguez MRN: [id number] DOB: January 31, 1951 Today's Date: 05/15/2023   History of Present Illness 73 year old man who presented 05/06/23 with increasing dyspnea. Recently homeless and without meds. Intubated 3/26-3/28; afib with RVR; PMH significant for a-fib on coumadin, alcohol use, COPD on 3L oxygen, heart failure EF 40%.    PT Comments  Pt tolerated treatment well today. Pt was able to progress ambulation in hallway with RW CGA. Pt received on 3.5L at 93%. Pt desat to 78% on 4L during ambulation requiring ~10 minutes of PLB to recover to 90% on 4L. Pt left on 3.5L at 93%. No change in DC/DME recs at this time. PT will continue to follow.     If plan is discharge home, recommend the following: A little help with walking and/or transfers;Assist for transportation   Can travel by private vehicle        Equipment Recommendations  None recommended by PT    Recommendations for Other Services       Precautions / Restrictions Precautions Precautions: Fall;Other (comment) Precaution/Restrictions Comments: watch sats Restrictions Weight Bearing Restrictions Per Provider Order: No     Mobility  Bed Mobility Overal bed mobility: Needs Assistance Bed Mobility: Supine to Sit, Sit to Supine     Supine to sit: Min assist Sit to supine: Contact guard assist   General bed mobility comments: Pt requesting HHA for trunk elevation.    Transfers Overall transfer level: Needs assistance Equipment used: Rolling walker (2 wheels) Transfers: Sit to/from Stand Sit to Stand: Supervision           General transfer comment: vc for proper hand placement for safety    Ambulation/Gait Ambulation/Gait assistance: Contact guard assist Gait Distance (Feet): 150 Feet Assistive device: Rolling walker (2 wheels) Gait Pattern/deviations: Step-through pattern, Step-to pattern, Decreased stride length Gait velocity: decreased     General  Gait Details: vc for proximity to RW. 2 standing rest breaks to check sats.   Stairs             Wheelchair Mobility     Tilt Bed    Modified Rankin (Stroke Patients Only)       Balance Overall balance assessment: Needs assistance Sitting-balance support: No upper extremity supported, Feet supported Sitting balance-Leahy Scale: Fair     Standing balance support: Bilateral upper extremity supported, Reliant on assistive device for balance Standing balance-Leahy Scale: Poor                              Communication Communication Communication: No apparent difficulties  Cognition Arousal: Alert Behavior During Therapy: WFL for tasks assessed/performed   PT - Cognitive impairments: Sequencing, Problem solving, Safety/Judgement                       PT - Cognition Comments: required cues for safe sequencing with RW Following commands: Intact      Cueing Cueing Techniques: Verbal cues  Exercises      General Comments General comments (skin integrity, edema, etc.): Pt received on 3.5L at 93%. Pt desat to 78% on 4L during ambulation requiring ~10 minutes of PLB to recover to 90% on 4L. Pt left on 3.5L at 93%      Pertinent Vitals/Pain Pain Assessment Pain Assessment: No/denies pain    Home Living  Prior Function            PT Goals (current goals can now be found in the care plan section) Progress towards PT goals: Progressing toward goals    Frequency    Min 2X/week      PT Plan      Co-evaluation              AM-PAC PT "6 Clicks" Mobility   Outcome Measure  Help needed turning from your back to your side while in a flat bed without using bedrails?: A Little Help needed moving from lying on your back to sitting on the side of a flat bed without using bedrails?: A Little Help needed moving to and from a bed to a chair (including a wheelchair)?: A Little Help needed standing up  from a chair using your arms (e.g., wheelchair or bedside chair)?: A Little Help needed to walk in hospital room?: A Little Help needed climbing 3-5 steps with a railing? : Total 6 Click Score: 16    End of Session Equipment Utilized During Treatment: Gait belt;Oxygen Activity Tolerance: Patient tolerated treatment well Patient left: in bed;with call bell/phone within reach;with bed alarm set Nurse Communication: Mobility status PT Visit Diagnosis: Muscle weakness (generalized) (M62.81);Difficulty in walking, not elsewhere classified (R26.2)     Time: 4132-4401 PT Time Calculation (min) (ACUTE ONLY): 25 min  Charges:    $Gait Training: 23-37 mins PT General Charges $$ ACUTE PT VISIT: 1 Visit                     Shela Nevin, PT, DPT Acute Rehab Services [phone]    Gladys Damme 05/15/2023, 5:50 PM

## 2023-05-15 NOTE — Progress Notes (Signed)
 PROGRESS NOTE        PATIENT DETAILS Name: Johnny Rodriguez Age: 73 y.o. Sex: male Date of Birth: 03/27/1950 Admit Date: 05/06/2023 Admitting Physician Lynnell Catalan, MD XBM:[id number], Amy J, NP  Brief Summary: Patient is a 73 y.o.  male with history of COPD on 3 L of oxygen at home, HFrEF, PAF, HTN-who presented with shortness of breath-secondary to COPD exacerbation in the setting of H. influenzae pneumonia-initially placed on BiPAP-but continued to worsen-subsequently admitted to the ICU.  He was stabilized-extubated and subsequently transferred to [facility].  Significant events: 3/26>> presented with shortness of breath-intubated-admit to ICU 3/28>> extubated 3/30>> transferred to [facility]  Significant studies: 3/20>> echo: EF 40-45% 3/26>> CXR: COPD-no obvious infiltrates. 4/02>> CTA chest: Right lower lobe PE  Significant microbiology data: 3/26>> COVID/influenza/RSV PCR: Negative 3/26>> respiratory virus panel: Negative 3/26>> tracheal aspirate: Haemophilus influenza A  Procedures: 3/26-3/28 >>ETT  Consults: PCCM  Subjective: Feels better-still thinking about leaving AMA.  Down to 6 L of oxygen this morning (was on 10 L yesterday).  I have again counseled him against leaving AGAINST MEDICAL ADVICE.  He is aware of the life threatening/life disabling risks-including death.  Objective: Vitals: Blood pressure 103/72, pulse 84, temperature (!) 97.5 F (36.4 C), temperature source Oral, resp. rate 19, height 5\' 11"  (1.803 m), weight 80.1 kg, SpO2 96%.   Exam: Gen Exam:Alert awake-not in any distress HEENT:atraumatic, normocephalic Chest: B/L clear to auscultation anteriorly CVS:S1S2 regular Abdomen:soft non tender, non distended Extremities:no edema Neurology: Non focal Skin: no rash  Pertinent Labs/Radiology:    Latest Ref Rng & Units 05/13/2023    2:35 AM 05/12/2023    4:19 AM 05/11/2023    5:30 AM  CBC  WBC 4.0 - 10.5 K/uL 13.6  16.1  18.9    Hemoglobin 13.0 - 17.0 g/dL 10.2  72.5  36.6   Hematocrit 39.0 - 52.0 % 42.1  41.3  45.5   Platelets 150 - 400 K/uL 225  221  274     Lab Results  Component Value Date   NA 138 05/13/2023   K 4.6 05/13/2023   CL 83 (L) 05/13/2023   CO2 >45 (H) 05/13/2023      Assessment/Plan: Acute on chronic respiratory failure secondary to COPD exacerbation from H influenza PNA Intubated on initial presentation-extubated on 3/28 Slowly improving-Down to 6 L of oxygen today Continue bronchodilators/Augmentin/anticoagulation Volume status stable-suspect can hold off on further diuretics at this point Continue to slowly titrate down FiO2 He is still contemplating leaving AGAINST MEDICAL ADVICE-I have counseled him against it-he is aware of the life-threatening/life disabling risks.  Chronic atrial fibrillation Rate controlled Continue Eliquis  Acute on chronic HFrEF Now improved and euvolemic -5.8 L so far. Hold other diuretics-await a.m. labs.  Known history of PE Small PE seen on CTA chest-not sure if this is a residual PE from prior PE. Suspect this is a incidental finding and not playing a major role in his symptomatology. Already on anticoagulation  Chronic pain syndrome Neurontin  Debility/deconditioning PT/OT-Home health recommended  History of tobacco use Counseled before discharge  Nutrition Status: Nutrition Problem: Moderate Malnutrition Etiology: chronic illness Signs/Symptoms: severe muscle depletion, moderate muscle depletion, moderate fat depletion Interventions: Refer to RD note for recommendations  BMI: Estimated body mass index is 24.63 kg/m as calculated from the following:   Height as of this encounter:  5\' 11"  (1.803 m).   Weight as of this encounter: 80.1 kg.   Code status:   Code Status: Do not attempt resuscitation (DNR) PRE-ARREST INTERVENTIONS DESIRED   DVT Prophylaxis: SCDs Start: 05/06/23 1512 apixaban (ELIQUIS) tablet 5 mg    Family  Communication: None at bedside   Disposition Plan: Status is: Inpatient Remains inpatient appropriate because: Severity of illness   Planned Discharge Destination:Home health   Diet: Diet Order             Diet regular Room service appropriate? Yes; Fluid consistency: Thin  Diet effective now                     Antimicrobial agents: Anti-infectives (From admission, onward)    Start     Dose/Rate Route Frequency Ordered Stop   05/09/23 1215  amoxicillin-clavulanate (AUGMENTIN) 875-125 MG per tablet 1 tablet        1 tablet Oral Every 12 hours 05/09/23 1120 05/14/23 2217   05/08/23 1100  doxycycline (VIBRAMYCIN) 100 mg in sodium chloride 0.9 % 250 mL IVPB  Status:  Discontinued        100 mg 125 mL/hr over 120 Minutes Intravenous Every 12 hours 05/08/23 1006 05/09/23 1116   05/07/23 1415  ceFEPIme (MAXIPIME) 2 g in sodium chloride 0.9 % 100 mL IVPB  Status:  Discontinued        2 g 200 mL/hr over 30 Minutes Intravenous Every 12 hours 05/07/23 1324 05/09/23 1120   05/06/23 1530  cefTRIAXone (ROCEPHIN) 2 g in sodium chloride 0.9 % 100 mL IVPB  Status:  Discontinued        2 g 200 mL/hr over 30 Minutes Intravenous Every 24 hours 05/06/23 1519 05/07/23 1317   05/06/23 1530  azithromycin (ZITHROMAX) 500 mg in sodium chloride 0.9 % 250 mL IVPB  Status:  Discontinued        500 mg 250 mL/hr over 60 Minutes Intravenous Every 24 hours 05/06/23 1519 05/08/23 1458        MEDICATIONS: Scheduled Meds:  apixaban  5 mg Oral BID   arformoterol  15 mcg Nebulization BID   aspirin EC  81 mg Oral Daily   budesonide (PULMICORT) nebulizer solution  0.25 mg Nebulization BID   Chlorhexidine Gluconate Cloth  6 each Topical Daily   famotidine  20 mg Oral Daily   feeding supplement  237 mL Oral BID BM   folic acid  1 mg Oral Daily   gabapentin  400 mg Oral TID   levalbuterol  0.63 mg Nebulization TID   And   ipratropium  0.5 mg Nebulization TID   linaclotide  145 mcg Oral QAC  breakfast   metoprolol succinate  50 mg Oral Daily   multivitamin with minerals  1 tablet Oral Daily   revefenacin  175 mcg Nebulization Daily   saccharomyces boulardii  250 mg Oral BID   thiamine  100 mg Oral Daily   Continuous Infusions: PRN Meds:.albuterol, ondansetron (ZOFRAN) IV, pantoprazole, polyethylene glycol, senna, traZODone   I have personally reviewed following labs and imaging studies  LABORATORY DATA: CBC: Recent Labs  Lab 05/09/23 0439 05/10/23 0545 05/11/23 0530 05/12/23 0419 05/13/23 0235  WBC 17.0* 15.6* 18.9* 16.1* 13.6*  HGB 13.1 13.1 13.6 12.6* 12.6*  HCT 42.5 44.5 45.5 41.3 42.1  MCV 102.7* 104.2* 104.6* 102.2* 102.2*  PLT 268 273 274 221 225    Basic Metabolic Panel: Recent Labs  Lab 05/09/23 0439 05/10/23 0654 05/11/23 0530 05/12/23  0419 05/13/23 0235  NA 141 143 141 139 138  K 4.7 4.5 5.2* 4.8 4.6  CL 99 93* 94* 89* 83*  CO2 36* >45* 41* 45* >45*  GLUCOSE 157* 120* 178* 154* 154*  BUN 45* 37* 35* 25* 24*  CREATININE 1.01 0.95 0.84 0.70 0.82  CALCIUM 8.3* 9.4 9.5 9.0 9.1  MG 2.1 2.3 2.2 1.9 1.9    GFR: Estimated Creatinine Clearance: 88 mL/min (by C-G formula based on SCr of 0.82 mg/dL).  Liver Function Tests: No results for input(s): "AST", "ALT", "ALKPHOS", "BILITOT", "PROT", "ALBUMIN" in the last 168 hours.  No results for input(s): "LIPASE", "AMYLASE" in the last 168 hours. No results for input(s): "AMMONIA" in the last 168 hours.  Coagulation Profile: No results for input(s): "INR", "PROTIME" in the last 168 hours.  Cardiac Enzymes: No results for input(s): "CKTOTAL", "CKMB", "CKMBINDEX", "TROPONINI" in the last 168 hours.  BNP (last 3 results) No results for input(s): "PROBNP" in the last 8760 hours.  Lipid Profile: No results for input(s): "CHOL", "HDL", "LDLCALC", "TRIG", "CHOLHDL", "LDLDIRECT" in the last 72 hours.  Thyroid Function Tests: No results for input(s): "TSH", "T4TOTAL", "FREET4", "T3FREE", "THYROIDAB"  in the last 72 hours.  Anemia Panel: No results for input(s): "VITAMINB12", "FOLATE", "FERRITIN", "TIBC", "IRON", "RETICCTPCT" in the last 72 hours.  Urine analysis:    Component Value Date/Time   COLORURINE AMBER (A) 05/06/2023 1815   APPEARANCEUR HAZY (A) 05/06/2023 1815   LABSPEC 1.017 05/06/2023 1815   PHURINE 5.0 05/06/2023 1815   GLUCOSEU 50 (A) 05/06/2023 1815   HGBUR SMALL (A) 05/06/2023 1815   BILIRUBINUR NEGATIVE 05/06/2023 1815   KETONESUR 5 (A) 05/06/2023 1815   PROTEINUR 100 (A) 05/06/2023 1815   UROBILINOGEN 0.2 12/08/2012 1516   NITRITE NEGATIVE 05/06/2023 1815   LEUKOCYTESUR NEGATIVE 05/06/2023 1815    Sepsis Labs: Lactic Acid, Venous    Component Value Date/Time   LATICACIDVEN 4.1 (HH) 05/06/2023 1907    MICROBIOLOGY: Recent Results (from the past 240 hours)  Resp panel by RT-PCR (RSV, Flu A&B, Covid) Anterior Nasal Swab     Status: None   Collection Time: 05/06/23  2:00 PM   Specimen: Anterior Nasal Swab  Result Value Ref Range Status   SARS Coronavirus 2 by RT PCR NEGATIVE NEGATIVE Final   Influenza A by PCR NEGATIVE NEGATIVE Final   Influenza B by PCR NEGATIVE NEGATIVE Final    Comment: (NOTE) The Xpert Xpress SARS-CoV-2/FLU/RSV plus assay is intended as an aid in the diagnosis of influenza from Nasopharyngeal swab specimens and should not be used as a sole basis for treatment. Nasal washings and aspirates are unacceptable for Xpert Xpress SARS-CoV-2/FLU/RSV testing.  Fact Sheet for Patients: BloggerCourse.com  Fact Sheet for Healthcare Providers: SeriousBroker.it  This test is not yet approved or cleared by the [place] FDA and has been authorized for detection and/or diagnosis of SARS-CoV-2 by FDA under an Emergency Use Authorization (EUA). This EUA will remain in effect (meaning this test can be used) for the duration of the COVID-19 declaration under Section 564(b)(1) of the Act, 21  U.S.C. section 360bbb-3(b)(1), unless the authorization is terminated or revoked.     Resp Syncytial Virus by PCR NEGATIVE NEGATIVE Final    Comment: (NOTE) Fact Sheet for Patients: BloggerCourse.com  Fact Sheet for Healthcare Providers: SeriousBroker.it  This test is not yet approved or cleared by the [place] FDA and has been authorized for detection and/or diagnosis of SARS-CoV-2 by FDA under an Emergency Use Authorization (  EUA). This EUA will remain in effect (meaning this test can be used) for the duration of the COVID-19 declaration under Section 564(b)(1) of the Act, 21 U.S.C. section 360bbb-3(b)(1), unless the authorization is terminated or revoked.  Performed at [facility], [address] [place], [place] [address]   MRSA Next Gen by PCR, Nasal     Status: None   Collection Time: 05/06/23  5:15 PM   Specimen: Nasal Mucosa; Nasal Swab  Result Value Ref Range Status   MRSA by PCR Next Gen NOT DETECTED NOT DETECTED Final    Comment: (NOTE) The GeneXpert MRSA Assay (FDA approved for NASAL specimens only), is one component of a comprehensive MRSA colonization surveillance program. It is not intended to diagnose MRSA infection nor to guide or monitor treatment for MRSA infections. Test performance is not FDA approved in patients less than 66 years old. Performed at [facility], [address], [place], [place] [address]   Culture, Respiratory w Gram Stain     Status: None   Collection Time: 05/06/23  9:36 PM   Specimen: Tracheal Aspirate; Respiratory  Result Value Ref Range Status   Specimen Description TRACHEAL ASPIRATE  Final   Special Requests Normal  Final   Gram Stain   Final    MODERATE WBC PRESENT, PREDOMINANTLY PMN MODERATE GRAM NEGATIVE RODS FEW GRAM POSITIVE COCCI IN PAIRS    Culture   Final    MODERATE HAEMOPHILUS INFLUENZAE BETA LACTAMASE POSITIVE Performed at [facility], [address], [place], [place] [address]    Report Status 05/09/2023 FINAL  Final  Respiratory (~20 pathogens) panel by PCR     Status: None   Collection Time: 05/06/23  9:36 PM   Specimen: Nasopharyngeal Swab; Respiratory  Result Value Ref Range Status   Adenovirus NOT DETECTED NOT DETECTED Final   Coronavirus 229E NOT DETECTED NOT DETECTED Final    Comment: (NOTE) The Coronavirus on the Respiratory Panel, DOES NOT test for the novel  Coronavirus (2019 nCoV)    Coronavirus HKU1 NOT DETECTED NOT DETECTED Final   Coronavirus NL63 NOT DETECTED NOT DETECTED Final   Coronavirus OC43 NOT DETECTED NOT DETECTED Final   Metapneumovirus NOT DETECTED NOT DETECTED Final   Rhinovirus / Enterovirus NOT DETECTED NOT DETECTED Final   Influenza A NOT DETECTED NOT DETECTED Final   Influenza B NOT DETECTED NOT DETECTED Final   Parainfluenza Virus 1 NOT DETECTED NOT DETECTED Final   Parainfluenza Virus 2 NOT DETECTED NOT DETECTED Final   Parainfluenza Virus 3 NOT DETECTED NOT DETECTED Final   Parainfluenza Virus 4 NOT DETECTED NOT DETECTED Final   Respiratory Syncytial Virus NOT DETECTED NOT DETECTED Final   Bordetella pertussis NOT DETECTED NOT DETECTED Final   Bordetella Parapertussis NOT DETECTED NOT DETECTED Final   Chlamydophila pneumoniae NOT DETECTED NOT DETECTED Final   Mycoplasma pneumoniae NOT DETECTED NOT DETECTED Final    Comment: Performed at [facility], [address], [place], [place] [address]    RADIOLOGY STUDIES/RESULTS: No results found.    LOS: 9 days   Jeoffrey Massed, MD  [facility]    To contact the attending provider between 7A-7P or the covering provider during after hours 7P-7A, please log into the web site [email or web address] and access using universal [place] password for that web site. If you do not have the password, please call the hospital operator.  05/15/2023, 10:01 AM

## 2023-05-15 NOTE — Plan of Care (Signed)
  Problem: Education: Goal: Knowledge of General Education information will improve Description: Including pain rating scale, medication(s)/side effects and non-pharmacologic comfort measures Outcome: Not Progressing   Problem: Health Behavior/Discharge Planning: Goal: Ability to manage health-related needs will improve Outcome: Not Progressing   Problem: Clinical Measurements: Goal: Ability to maintain clinical measurements within normal limits will improve Outcome: Progressing Goal: Respiratory complications will improve Outcome: Progressing

## 2023-05-15 NOTE — Progress Notes (Signed)
   05/15/23 0957  Mobility  Activity Ambulated with assistance in hallway  Level of Assistance Standby assist, set-up cues, supervision of patient - no hands on  Assistive Device Front wheel walker  Distance Ambulated (ft) 150 ft  Activity Response Tolerated fair  Mobility Referral Yes  Mobility visit 1 Mobility  Mobility Specialist Start Time (ACUTE ONLY) 0931  Mobility Specialist Stop Time (ACUTE ONLY) 0957  Mobility Specialist Time Calculation (min) (ACUTE ONLY) 26 min   Mobility Specialist: Progress Note  Pre-Mobility:      HR 94  During Mobility:             SpO2 89-93%  6L  Post-Mobility:    HR 93, SpO2 93% 5L  Pt agreeable to mobility session - received in bed. Pt was asymptomatic throughout session with no complaints. Returned to chair with all needs met - call bell within reach. RN titrated pt from 6L to 5L. SPO2 pleth displayed inaccurately on monitor - handheld pulse ox used.   Barnie Mort, BS Mobility Specialist Please contact via SecureChat or  Rehab office at [phone].

## 2023-05-15 NOTE — TOC Progression Note (Addendum)
 Transition of Care [facility]) - Progression Note    Patient Details  Name: Johnny Rodriguez MRN: [id number] Date of Birth: 1950-02-28  Transition of Care [facility]) CM/SW Contact  Gordy Clement, RN Phone Number: 05/15/2023, 12:20 PM  Clinical Narrative:    Patient is needing a portable tank delivered to room to dc to home  He is currently on 6 L  Mitch from [employer] to provide. Marthann Schiller will also arrange to change out patient existing 5L home concentrator. [facility] will provide HHPT at DC. Orders are in [facility] voucher is in chart on Unit . Do not anticipate any additional TOC needs     Expected Discharge Plan: Home/Self Care Barriers to Discharge: Continued Medical Work up  Expected Discharge Plan and Services   Discharge Planning Services: CM Consult Post Acute Care Choice: Home Health Living arrangements for the past 2 months: Apartment                           HH Arranged: PT, OT HH Agency: [facility] Date HH Agency Contacted: 05/12/23 Time HH Agency Contacted: 1158 Representative spoke with at [facility] Agency: Haywood Lasso   Social Determinants of Health (SDOH) Interventions SDOH Screenings   Food Insecurity: Patient Unable To Answer (05/06/2023)  Housing: Patient Unable To Answer (05/06/2023)  Transportation Needs: Patient Unable To Answer (05/06/2023)  Utilities: Patient Unable To Answer (05/06/2023)  Alcohol Screen: Low Risk  (09/11/2020)  Recent Concern: Alcohol Screen - Medium Risk (08/07/2020)  Depression (PHQ2-9): Low Risk  (11/21/2022)  Financial Resource Strain: Low Risk  (11/21/2022)  Physical Activity: Inactive (11/21/2022)  Social Connections: Patient Unable To Answer (05/06/2023)  Stress: No Stress Concern Present (11/21/2022)  Tobacco Use: High Risk (05/06/2023)  Health Literacy: Adequate Health Literacy (11/21/2022)    Readmission Risk Interventions     No data to display

## 2023-05-15 NOTE — Plan of Care (Signed)

## 2023-05-15 NOTE — Care Management (Signed)
 Oxygen saturation is between 94-96%, oxygen titrated down to 6L and staying at 93-94%, titrated down to 5L and monitored for a few minutes, saturations sustaining at 93%, on 4L Johnny Rodriguez oxygen saturations remained at 93%. Patient titrated down to 3L Johnny Rodriguez and oxygen saturation is holding at 92%.

## 2023-05-16 ENCOUNTER — Other Ambulatory Visit (HOSPITAL_COMMUNITY): Payer: Self-pay

## 2023-05-16 DIAGNOSIS — G4700 Insomnia, unspecified: Secondary | ICD-10-CM | POA: Diagnosis not present

## 2023-05-16 DIAGNOSIS — J441 Chronic obstructive pulmonary disease with (acute) exacerbation: Secondary | ICD-10-CM | POA: Diagnosis not present

## 2023-05-16 DIAGNOSIS — J449 Chronic obstructive pulmonary disease, unspecified: Secondary | ICD-10-CM | POA: Diagnosis not present

## 2023-05-16 DIAGNOSIS — J9601 Acute respiratory failure with hypoxia: Secondary | ICD-10-CM | POA: Diagnosis not present

## 2023-05-16 LAB — BASIC METABOLIC PANEL WITH GFR
Anion gap: 8 (ref 5–15)
BUN: 31 mg/dL — ABNORMAL HIGH (ref 8–23)
CO2: 35 mmol/L — ABNORMAL HIGH (ref 22–32)
Calcium: 9.4 mg/dL (ref 8.9–10.3)
Chloride: 96 mmol/L — ABNORMAL LOW (ref 98–111)
Creatinine, Ser: 0.87 mg/dL (ref 0.61–1.24)
GFR, Estimated: >60 mL/min (ref >60)
Glucose, Bld: 114 mg/dL — ABNORMAL HIGH (ref 70–99)
Potassium: 4.4 mmol/L (ref 3.5–5.1)
Sodium: 139 mmol/L (ref 135–145)

## 2023-05-16 MED ORDER — PANTOPRAZOLE SODIUM 40 MG PO TBEC
40.0000 mg | DELAYED_RELEASE_TABLET | Freq: Every day | ORAL | 2 refills | Status: AC | PRN
  Filled 2023-05-16: qty 30, 30d supply, fill #0

## 2023-05-16 MED ORDER — METOPROLOL SUCCINATE ER 50 MG PO TB24
50.0000 mg | ORAL_TABLET | Freq: Every day | ORAL | 3 refills | Status: DC
  Filled 2023-05-16: qty 90, 90d supply, fill #0

## 2023-05-16 MED ORDER — VENTOLIN HFA 108 (90 BASE) MCG/ACT IN AERS
1.0000 | INHALATION_SPRAY | Freq: Four times a day (QID) | RESPIRATORY_TRACT | 2 refills | Status: AC | PRN
  Filled 2023-05-16: qty 18, 14d supply, fill #0

## 2023-05-16 MED ORDER — LINACLOTIDE 145 MCG PO CAPS
145.0000 ug | ORAL_CAPSULE | Freq: Every day | ORAL | 3 refills | Status: AC
  Filled 2023-05-16: qty 90, 90d supply, fill #0

## 2023-05-16 MED ORDER — FLUTICASONE FUROATE-VILANTEROL 100-25 MCG/ACT IN AEPB
1.0000 | INHALATION_SPRAY | Freq: Every day | RESPIRATORY_TRACT | 2 refills | Status: AC
  Filled 2023-05-16: qty 60, 30d supply, fill #0

## 2023-05-16 MED ORDER — TRAZODONE HCL 50 MG PO TABS
50.0000 mg | ORAL_TABLET | Freq: Every evening | ORAL | 0 refills | Status: AC | PRN
  Filled 2023-05-16: qty 30, 30d supply, fill #0

## 2023-05-16 MED ORDER — ELIQUIS 5 MG PO TABS
5.0000 mg | ORAL_TABLET | Freq: Two times a day (BID) | ORAL | 2 refills | Status: AC
  Filled 2023-05-16: qty 60, 30d supply, fill #0

## 2023-05-16 MED ORDER — GABAPENTIN 400 MG PO CAPS
400.0000 mg | ORAL_CAPSULE | Freq: Every day | ORAL | 0 refills | Status: AC | PRN
  Filled 2023-05-16: qty 30, 30d supply, fill #0

## 2023-05-16 MED ORDER — ALBUTEROL SULFATE (2.5 MG/3ML) 0.083% IN NEBU
2.5000 mg | INHALATION_SOLUTION | RESPIRATORY_TRACT | 12 refills | Status: AC | PRN
  Filled 2023-05-16: qty 90, 5d supply, fill #0

## 2023-05-16 MED ORDER — SPIRIVA RESPIMAT 2.5 MCG/ACT IN AERS
2.0000 | INHALATION_SPRAY | Freq: Every day | RESPIRATORY_TRACT | 2 refills | Status: AC
  Filled 2023-05-16: qty 4, 30d supply, fill #0

## 2023-05-16 MED ORDER — FOLIC ACID 1 MG PO TABS
1.0000 mg | ORAL_TABLET | Freq: Every day | ORAL | 0 refills | Status: AC
  Filled 2023-05-16: qty 30, 30d supply, fill #0

## 2023-05-16 NOTE — Plan of Care (Signed)
  Problem: Education: Goal: Knowledge of General Education information will improve Description: Including pain rating scale, medication(s)/side effects and non-pharmacologic comfort measures Outcome: Completed/Met   Problem: Health Behavior/Discharge Planning: Goal: Ability to manage health-related needs will improve Outcome: Completed/Met   Problem: Clinical Measurements: Goal: Ability to maintain clinical measurements within normal limits will improve Outcome: Completed/Met Goal: Will remain free from infection Outcome: Completed/Met Goal: Diagnostic test results will improve Outcome: Completed/Met Goal: Respiratory complications will improve Outcome: Completed/Met Goal: Cardiovascular complication will be avoided Outcome: Completed/Met   Problem: Activity: Goal: Risk for activity intolerance will decrease Outcome: Completed/Met   Problem: Nutrition: Goal: Adequate nutrition will be maintained Outcome: Completed/Met   Problem: Coping: Goal: Level of anxiety will decrease Outcome: Completed/Met   Problem: Elimination: Goal: Will not experience complications related to bowel motility Outcome: Completed/Met Goal: Will not experience complications related to urinary retention Outcome: Completed/Met   Problem: Pain Managment: Goal: General experience of comfort will improve and/or be controlled Outcome: Completed/Met   Problem: Safety: Goal: Ability to remain free from injury will improve Outcome: Completed/Met   Problem: Skin Integrity: Goal: Risk for impaired skin integrity will decrease Outcome: Completed/Met   Problem: Education: Goal: Ability to describe self-care measures that may prevent or decrease complications (Diabetes Survival Skills Education) will improve Outcome: Completed/Met   Problem: Coping: Goal: Ability to adjust to condition or change in health will improve Outcome: Completed/Met   Problem: Fluid Volume: Goal: Ability to maintain a  balanced intake and output will improve Outcome: Completed/Met   Problem: Health Behavior/Discharge Planning: Goal: Ability to identify and utilize available resources and services will improve Outcome: Completed/Met Goal: Ability to manage health-related needs will improve Outcome: Completed/Met   Problem: Metabolic: Goal: Ability to maintain appropriate glucose levels will improve Outcome: Completed/Met   Problem: Nutritional: Goal: Maintenance of adequate nutrition will improve Outcome: Completed/Met Goal: Progress toward achieving an optimal weight will improve Outcome: Completed/Met   Problem: Skin Integrity: Goal: Risk for impaired skin integrity will decrease Outcome: Completed/Met   Problem: Tissue Perfusion: Goal: Adequacy of tissue perfusion will improve Outcome: Completed/Met

## 2023-05-16 NOTE — TOC Transition Note (Addendum)
 Transition of Care [facility]) - Discharge Note   Patient Details  Name: Johnny Rodriguez MRN: [id number] Date of Birth: 1950/09/27  Transition of Care [facility]) CM/SW Contact:  Lawerance Sabal, RN Phone Number: 05/16/2023, 8:55 AM   Clinical Narrative:     Notified [facility] of DC.  Cab voucher provided by CM yesterday. Spoke w patient and he said that portable O2 is in the room. Per nursing notes and flowsheet he has been at less than 5L [place].   0904 Just notified by nursing that he is requiring 6L, spoke w [employer] who has him on delivery list for 10L concentrator, called patient back to ensure that he has someone at the apartment who can accept delivery, he will try to set up and asked I call him back in 30 minutes.   8413 Per nurse sensor changed and patient satting well on 4L [place]. Per MD ok to DC.   Final next level of care: Home w Home Health Services Barriers to Discharge: No Barriers Identified   Patient Goals and CMS Choice Patient states their goals for this hospitalization and ongoing recovery are:: to return home CMS Medicare.gov Compare Post Acute Care list provided to:: Patient Represenative (must comment) Choice offered to / list presented to : Patient      Discharge Placement                       Discharge Plan and Services Additional resources added to the After Visit Summary for     Discharge Planning Services: CM Consult Post Acute Care Choice: Home Health                    HH Arranged: PT, OT [facility] Agency: [facility] Date [facility] Agency Contacted: 05/16/23 Time HH Agency Contacted: [phone] Representative spoke with at [facility] Agency: Rivka Barbara  Social Drivers of Health (SDOH) Interventions SDOH Screenings   Food Insecurity: Patient Unable To Answer (05/06/2023)  Housing: Patient Unable To Answer (05/06/2023)  Transportation Needs: Patient Unable To Answer (05/06/2023)  Utilities: Patient Unable To Answer (05/06/2023)  Alcohol Screen: Low Risk  (09/11/2020)  Recent  Concern: Alcohol Screen - Medium Risk (08/07/2020)  Depression (PHQ2-9): Low Risk  (11/21/2022)  Financial Resource Strain: Low Risk  (11/21/2022)  Physical Activity: Inactive (11/21/2022)  Social Connections: Patient Unable To Answer (05/06/2023)  Stress: No Stress Concern Present (11/21/2022)  Tobacco Use: High Risk (05/06/2023)  Health Literacy: Adequate Health Literacy (11/21/2022)     Readmission Risk Interventions     No data to display

## 2023-05-16 NOTE — Plan of Care (Signed)
  Problem: Education: Goal: Knowledge of General Education information will improve Description: Including pain rating scale, medication(s)/side effects and non-pharmacologic comfort measures Outcome: Not Progressing   Problem: Health Behavior/Discharge Planning: Goal: Ability to manage health-related needs will improve Outcome: Not Progressing   Problem: Nutrition: Goal: Adequate nutrition will be maintained Outcome: Progressing   Problem: Elimination: Goal: Will not experience complications related to bowel motility Outcome: Progressing Goal: Will not experience complications related to urinary retention Outcome: Progressing   Problem: Pain Managment: Goal: General experience of comfort will improve and/or be controlled Outcome: Progressing   Problem: Safety: Goal: Ability to remain free from injury will improve Outcome: Progressing   Problem: Skin Integrity: Goal: Risk for impaired skin integrity will decrease Outcome: Progressing

## 2023-05-16 NOTE — Discharge Summary (Signed)
 PATIENT DETAILS Name: Johnny Rodriguez Age: 73 y.o. Sex: male Date of Birth: 10-27-1950 MRN: [id number]. Admitting Physician: Lynnell Catalan, MD VHQ:[id number], Salomon Fick, NP  Admit Date: 05/06/2023 Discharge date: 05/16/2023  Recommendations for Outpatient Follow-up:  Follow up with PCP in 1-2 weeks Please obtain CMP/CBC in one week  Admitted From:  Home  Disposition: Home health   Discharge Condition: good  CODE STATUS:   Code Status: Do not attempt resuscitation (DNR) PRE-ARREST INTERVENTIONS DESIRED   Diet recommendation:  Diet Order             Diet - low sodium heart healthy           Diet regular Room service appropriate? Yes; Fluid consistency: Thin  Diet effective now                    Brief Summary: Patient is a 73 y.o.  male with history of COPD on 3 L of oxygen at home, HFrEF, PAF, HTN-who presented with shortness of breath-secondary to COPD exacerbation in the setting of H. influenzae pneumonia-initially placed on BiPAP-but continued to worsen-subsequently admitted to the ICU.  He was stabilized-extubated and subsequently transferred to [facility].   Significant events: 3/26>> presented with shortness of breath-intubated-admit to ICU 3/28>> extubated 3/30>> transferred to [facility]   Significant studies: 3/20>> echo: EF 40-45% 3/26>> CXR: COPD-no obvious infiltrates. 4/02>> CTA chest: Right lower lobe PE   Significant microbiology data: 3/26>> COVID/influenza/RSV PCR: Negative 3/26>> respiratory virus panel: Negative 3/26>> tracheal aspirate: Haemophilus influenza A   Procedures: 3/26-3/28 >>ETT   Consults: PCCM  Brief Hospital Course: Acute on chronic respiratory failure secondary to COPD exacerbation from H influenza PNA Intubated on initial presentation-extubated on 3/28-but required HFNC-that gradually improved-Down to 3-4 L of oxygen by the day of discharge Manage with bronchodilators/antibiotics/anticoagulation Given as needed diuretics As  noted above-now clinically improved-back to baseline-euvolemic on exam.  Note has been asking to be discharged and contemplating leaving AMA for the past several days. Continue Spiriva/Breo Ellipta-and as needed albuterol inhaler/nebs on discharge Does not need any further antibiotics on discharge as he has completed a course while in the hospital. Counseled regarding importance of completely stopping alcohol use.   Chronic atrial fibrillation Rate controlled Continue Eliquis   Acute on chronic HFrEF Now improved and euvolemic -7.3 L so far Does not require any further diuretics on discharge.   Known history of PE Small PE seen on CTA chest-not sure if this is a residual PE from prior PE. Suspect this is a incidental finding and not playing a major role in his symptomatology. Already on anticoagulation   Chronic pain syndrome Neurontin   Debility/deconditioning PT/OT-Home health recommended   History of tobacco use Counseled extensively.  Claims he will quit.   Nutrition Status: Nutrition Problem: Moderate Malnutrition Etiology: chronic illness Signs/Symptoms: severe muscle depletion, moderate muscle depletion, moderate fat depletion Interventions: Refer to RD note for recommendations   BMI: Estimated body mass index is 24.63 kg/m as calculated from the following:   Height as of this encounter: 5\' 11"  (1.803 m).   Weight as of this encounter: 80.1 kg.    Discharge Diagnoses:  Principal Problem:   Respiratory failure with hypoxia (HCC) Active Problems:   Malnutrition of moderate degree   Discharge Instructions:  Activity:  As tolerated with Full fall precautions use walker/cane & assistance as needed  Discharge Instructions     Call MD for:  difficulty breathing, headache or visual disturbances   Complete  by: As directed    Diet - low sodium heart healthy   Complete by: As directed    Discharge instructions   Complete by: As directed    Follow with Primary  MD  Rema Fendt, NP in 1-2 weeks  Please get a complete blood count and chemistry panel checked by your Primary MD at your next visit, and again as instructed by your Primary MD.  Get Medicines reviewed and adjusted: Please take all your medications with you for your next visit with your Primary MD  Laboratory/radiological data: Please request your Primary MD to go over all hospital tests and procedure/radiological results at the follow up, please ask your Primary MD to get all Hospital records sent to his/her office.  In some cases, they will be blood work, cultures and biopsy results pending at the time of your discharge. Please request that your primary care M.D. follows up on these results.  Also Note the following: If you experience worsening of your admission symptoms, develop shortness of breath, life threatening emergency, suicidal or homicidal thoughts you must seek medical attention immediately by calling 911 or calling your MD immediately  if symptoms less severe.  You must read complete instructions/literature along with all the possible adverse reactions/side effects for all the Medicines you take and that have been prescribed to you. Take any new Medicines after you have completely understood and accpet all the possible adverse reactions/side effects.   Do not drive when taking Pain medications or sleeping medications (Benzodaizepines)  Do not take more than prescribed Pain, Sleep and Anxiety Medications. It is not advisable to combine anxiety,sleep and pain medications without talking with your primary care practitioner  Special Instructions: If you have smoked or chewed Tobacco  in the last 2 yrs please stop smoking, stop any regular Alcohol  and or any Recreational drug use.  Wear Seat belts while driving.  Please note: You were cared for by a hospitalist during your hospital stay. Once you are discharged, your primary care physician will handle any further medical  issues. Please note that NO REFILLS for any discharge medications will be authorized once you are discharged, as it is imperative that you return to your primary care physician (or establish a relationship with a primary care physician if you do not have one) for your post hospital discharge needs so that they can reassess your need for medications and monitor your lab values.   Increase activity slowly   Complete by: As directed       Allergies as of 05/16/2023   No Known Allergies      Medication List     STOP taking these medications    predniSONE 10 MG tablet Commonly known as: DELTASONE       TAKE these medications    albuterol (2.5 MG/3ML) 0.083% nebulizer solution Commonly known as: PROVENTIL Take 3 mLs (2.5 mg total) by nebulization every 4 (four) hours as needed for wheezing or shortness of breath.   Ventolin HFA 108 (90 Base) MCG/ACT inhaler Generic drug: albuterol INHALE 1-2 PUFFS INTO THE LUNGS EVERY 6 HOURS AS NEEDED FOR WHEEZING OR SHORTNESS OF BREATH   aspirin EC 81 MG tablet Take 1 tablet (81 mg total) by mouth daily. Swallow whole.   Eliquis 5 MG Tabs tablet Generic drug: apixaban Take 1 tablet (5 mg total) by mouth 2 (two) times daily.   fluticasone furoate-vilanterol 100-25 MCG/ACT Aepb Commonly known as: Breo Ellipta Inhale 1 puff into the lungs daily.  folic acid 1 MG tablet Commonly known as: FOLVITE Take 1 tablet (1 mg total) by mouth daily.   gabapentin 400 MG capsule Commonly known as: NEURONTIN Take 1 capsule (400 mg total) by mouth daily as needed (for nerve pain).   linaclotide 145 MCG Caps capsule Commonly known as: Linzess Take 1 capsule (145 mcg total) by mouth daily before breakfast.   metoprolol succinate 50 MG 24 hr tablet Commonly known as: TOPROL-XL Take 1 tablet (50 mg total) by mouth daily. Take with or immediately following a meal.   OXYGEN Inhale 3 L into the lungs continuous.   pantoprazole 40 MG tablet Commonly  known as: PROTONIX Take 1 tablet (40 mg total) by mouth daily as needed (acid reflux).   polyethylene glycol 17 g packet Commonly known as: MIRALAX / GLYCOLAX Take 17 g by mouth daily. What changed:  when to take this reasons to take this   senna 8.6 MG Tabs tablet Commonly known as: SENOKOT Take 1-2 tablets (8.6-17.2 mg total) by mouth daily. What changed:  when to take this reasons to take this   Spiriva Respimat 2.5 MCG/ACT Aers Generic drug: Tiotropium Bromide Monohydrate Inhale 2 puffs into the lungs daily.   traZODone 50 MG tablet Commonly known as: DESYREL Take 1 tablet (50 mg total) by mouth at bedtime as needed for sleep.               Durable Medical Equipment  (From admission, onward)           Start     Ordered   05/16/23 0836  For home use only DME oxygen  Once       Comments: 3-4 L at rest  5-6 L with ambulation.  Question Answer Comment  Length of Need Lifetime   Mode or (Route) Nasal cannula   Liters per Minute 6   Frequency Continuous (stationary and portable oxygen unit needed)   Oxygen conserving device Yes   Oxygen delivery system Gas      05/16/23 0835            Follow-up Information     [facility] Follow up.   Why: [facility] will contact you within 48 hours to schedule a home health visit Contact information: [address] [place], [place] [address]  Phone: [phone]        Rema Fendt, NP. Schedule an appointment as soon as possible for a visit in 1 week(s).   Specialty: Nurse Practitioner Contact information: [address] [place] [address] [phone]                No Known Allergies   Other Procedures/Studies: CT Angio Chest Pulmonary Embolism (PE) W or WO Contrast Result Date: 05/13/2023 CLINICAL DATA:  Hypoxia EXAM: CT ANGIOGRAPHY CHEST WITH CONTRAST TECHNIQUE: Multidetector CT imaging of the chest was performed using the standard protocol during bolus administration of  intravenous contrast. Multiplanar CT image reconstructions and MIPs were obtained to evaluate the vascular anatomy. RADIATION DOSE REDUCTION: This exam was performed according to the departmental dose-optimization program which includes automated exposure control, adjustment of the mA and/or kV according to patient size and/or use of iterative reconstruction technique. CONTRAST:  75mL OMNIPAQUE IOHEXOL 350 MG/ML SOLN COMPARISON:  CTA 03/27/2022. FINDINGS: Cardiovascular: Heart is enlarged. Small pericardial effusion, increased from prior CTA. Coronary artery calcifications are seen. The thoracic aorta has a normal course and caliber with some atherosclerotic partially calcified plaque. Some enlargement of the main  pulmonary arteries. Please correlate for evidence of pulmonary hypertension. Also some subtle filling defect identified in the right lower lobe central pulmonary artery and some peripheral areas along the posteromedial right lower lobe. Subtle pulmonary emboli. Some of the changes could be mixing artifact as well. Distribution is similar to previous but appears more so than on the prior. Right IJ line in place. Mediastinum/Nodes: Preserved thyroid gland. Slightly patulous thoracic esophagus. No specific abnormal lymph node enlargement present in the axillary region, hilum or mediastinum. Lungs/Pleura: Small left and tiny right pleural effusions are identified. Diffuse peripheral interstitial septal thickening identified with some nodular contour to the thickening. Tree-in-bud like changes. There is significant areas of mucous plugging identified in the right lower lobe. Subtle areas of atelectasis seen in the lingula dependently. Additional underlying centrilobular emphysematous changes. Upper Abdomen: Stable nodular thickening of the adrenal glands, left greater than right. Musculoskeletal: Curvature of the spine with scattered degenerative changes. Stable mild compression deformity at of T6. multiple  healed left-sided rib fractures. Review of the MIP images confirms the above findings. Critical Value/emergent results were called by telephone at the time of interpretation on 05/13/2023 at 10:59 am to provider [facility] , who verbally acknowledged these results. IMPRESSION: Small area of filling defect along the right lower lobe consistent with pulmonary emboli. Some of this could be mixing artifact as well. Minimal clot burden. Similar distribution to prior embolus but different configuration and slightly more than prior. Enlarged heart with small pericardial effusion. Coronary artery calcifications. Enlargement of the pulmonary arteries. Please correlate for any evidence of pulmonary hypertension. Emphysematous lung changes. Persistent areas of interstitial septal thickening with tree-in-bud like changes. Aortic Atherosclerosis (ICD10-I70.0) and Emphysema (ICD10-J43.9). Electronically Signed   By: Karen Kays M.D.   On: 05/13/2023 11:08   DG Chest Port 1 View Result Date: 05/12/2023 CLINICAL DATA:  [address] Acute respiratory distress [address]502 EXAM: PORTABLE CHEST 1 VIEW COMPARISON:  Chest x-ray 05/08/2023 trauma CT angio chest 01/25/2023 FINDINGS: Right chest wall likely subclavian venous catheter with tip overlying the distal superior vena cava. The heart and mediastinal contours are unchanged. Atherosclerotic plaque. Hyperinflation of the lungs. No focal consolidation. Chronic coarsened interstitial markings with no overt pulmonary edema. Blunting of bilateral costophrenic angles with possible bilateral trace pleural effusions. No pneumothorax. No acute osseous abnormality. IMPRESSION: 1. Blunting of bilateral costophrenic angles with possible bilateral trace pleural effusions. 2. Aortic Atherosclerosis (ICD10-I70.0) and Emphysema (ICD10-J43.9). Electronically Signed   By: Tish Frederickson M.D.   On: 05/12/2023 20:59   DG Chest Port 1 View Result Date: 05/08/2023 CLINICAL DATA:  COPD exacerbation. EXAM:  PORTABLE CHEST 1 VIEW COMPARISON:  Radiographs 05/06/2023 and 12/05/2022.  CT 03/27/2022. FINDINGS: 1009 hours. Tip of the endotracheal tube is unchanged, proximally 4.6 cm above the carina. Right subclavian central venous catheter and enteric tubes are unchanged in position. The heart size and mediastinal contours are stable with aortic atherosclerosis. Blunting of the left costophrenic angle is noted, incompletely visualized on the most recent prior study, but appearing progressive from older comparison examinations. There is no edema, confluent airspace disease or pneumothorax. The bones appear unchanged. IMPRESSION: 1. Stable support system. 2. Possible enlarging left pleural effusion. No focal airspace disease. Electronically Signed   By: Carey Bullocks M.D.   On: 05/08/2023 11:06   US Abdomen Limited RUQ (LIVER/GB) Result Date: 05/06/2023 CLINICAL DATA:  Elevated liver enzymes EXAM: ULTRASOUND ABDOMEN LIMITED RIGHT UPPER QUADRANT COMPARISON:  CT abdomen and pelvis 11/18/2022 FINDINGS: Gallbladder: No gallstones or wall  thickening visualized. No sonographic Murphy sign noted by sonographer. Common bile duct: Diameter: 4 mm, normal Liver: No focal lesion identified. Within normal limits in parenchymal echogenicity. Portal vein is patent on color Doppler imaging with normal direction of blood flow towards the liver. Other: Trace free fluid around the liver edge. IMPRESSION: Trace free fluid around the liver edge. No evidence of cholelithiasis or acute cholecystitis. Electronically Signed   By: Burman Nieves M.D.   On: 05/06/2023 20:42   US RENAL Result Date: 05/06/2023 CLINICAL DATA:  Given history is to confirm NG tube placement. EXAM: RENAL / URINARY TRACT ULTRASOUND COMPLETE COMPARISON:  CT abdomen and pelvis 11/18/2022 FINDINGS: Right Kidney: Renal measurements: 10.6 x 5.2 x 5.5 cm = volume: 158 mL. Echogenicity within normal limits. No mass or hydronephrosis visualized. Left Kidney: Renal  measurements: 10 x 5.1 x 3.7 cm = volume: 98 mL. Echogenicity within normal limits. No mass or hydronephrosis visualized. Bladder: Foley catheter with retention balloon in the bladder. Bladder remains urine filled without additional filling defect. No bladder wall thickening. Other: Unable to evaluate nasogastric tube on renal ultrasound. IMPRESSION: 1. Normal ultrasound appearance of the kidneys.  No hydronephrosis. 2. Foley catheter in the bladder. Electronically Signed   By: Burman Nieves M.D.   On: 05/06/2023 20:40   DG Chest Portable 1 View Result Date: 05/06/2023 CLINICAL DATA:  et place EXAM: PORTABLE CHEST 1 VIEW COMPARISON:  Chest x-ray 12/05/2022, CT angio chest 01/25/2023 FINDINGS: Right subclavian central venous catheter with tip overlying the expected region of the superior cavoatrial junction. Endotracheal tube terminates 6 cm above the carina. Enteric tube courses below the hemidiaphragm with tip and side port overlying the expected region of the gastric lumen. Partially visualized temperature probe overlies the pelvis. The heart and mediastinal contours are unchanged. Atherosclerotic plaque. No focal consolidation. No pulmonary edema. Hyperinflation of the hemidiaphragms with flattening of the hemi thoraces. No pneumothorax. No acute osseous abnormality. IMPRESSION: 1. Lines and tubes in good position. 2. No active disease. 3. Nonobstructive bowel gas pattern. 4. Aortic Atherosclerosis (ICD10-I70.0) and Emphysema (ICD10-J43.9). Electronically Signed   By: Tish Frederickson M.D.   On: 05/06/2023 17:23   DG Abd Portable 1 View Result Date: 05/06/2023 CLINICAL DATA:  et place EXAM: PORTABLE CHEST 1 VIEW COMPARISON:  Chest x-ray 12/05/2022, CT angio chest 01/25/2023 FINDINGS: Right subclavian central venous catheter with tip overlying the expected region of the superior cavoatrial junction. Endotracheal tube terminates 6 cm above the carina. Enteric tube courses below the hemidiaphragm with tip  and side port overlying the expected region of the gastric lumen. Partially visualized temperature probe overlies the pelvis. The heart and mediastinal contours are unchanged. Atherosclerotic plaque. No focal consolidation. No pulmonary edema. Hyperinflation of the hemidiaphragms with flattening of the hemi thoraces. No pneumothorax. No acute osseous abnormality. IMPRESSION: 1. Lines and tubes in good position. 2. No active disease. 3. Nonobstructive bowel gas pattern. 4. Aortic Atherosclerosis (ICD10-I70.0) and Emphysema (ICD10-J43.9). Electronically Signed   By: Tish Frederickson M.D.   On: 05/06/2023 17:23   DG Chest Portable 1 View Result Date: 05/06/2023 CLINICAL DATA:  Hypoxia. EXAM: PORTABLE CHEST 1 VIEW COMPARISON:  December 05, 2022 FINDINGS: The heart size and mediastinal contours are within normal limits. There is mild to moderate severity calcification of the aortic arch. Lungs are hyperinflated. Chronic bronchitic changes are again seen. There is no evidence of acute infiltrate, pleural effusion or pneumothorax. The visualized skeletal structures are unremarkable. IMPRESSION: COPD without acute or active cardiopulmonary  disease. Electronically Signed   By: Aram Candela M.D.   On: 05/06/2023 15:35   ECHOCARDIOGRAM COMPLETE Result Date: 04/30/2023    ECHOCARDIOGRAM REPORT   Patient Name:   YING ROCKS Melody Date of Exam: 04/30/2023 Medical Rec #:  [id number]          Height:       71.0 in Accession #:    [id number]         Weight:       168.0 lb Date of Birth:  07/07/1950          BSA:          1.958 m Patient Age:    73 years           BP:           107/65 mmHg Patient Gender: M                  HR:           112 bpm. Exam Location:  [address] Procedure: 2D Echo, Cardiac Doppler and Color Doppler (Both Spectral and Color            Flow Doppler were utilized during procedure). Indications:    R06.02 Shortness of breath  History:        Patient has prior history of Echocardiogram examinations,  most                 recent 06/16/2019. Cardiomyopathy, CAD, COPD, Arrythmias:Atrial                 Fibrillation; Risk Factors:Hypertension and Current Smoker.  Sonographer:    Daphine Deutscher RDCS Referring Phys: [id number] Luetta Nutting OLLIS IMPRESSIONS  1. Left ventricular ejection fraction, by estimation, is 40 to 45%. The left ventricle has mildly decreased function. The left ventricle demonstrates global hypokinesis. Left ventricular diastolic parameters are indeterminate.  2. Right ventricular systolic function is normal. The right ventricular size is normal. There is mildly elevated pulmonary artery systolic pressure.  3. Left atrial size was mild to moderately dilated.  4. Right atrial size was moderately dilated.  5. The mitral valve is normal in structure. Trivial mitral valve regurgitation. No evidence of mitral stenosis.  6. The aortic valve is tricuspid. Aortic valve regurgitation is not visualized. No aortic stenosis is present.  7. The inferior vena cava is normal in size with greater than 50% respiratory variability, suggesting right atrial pressure of 3 mmHg. FINDINGS  Left Ventricle: Left ventricular ejection fraction, by estimation, is 40 to 45%. The left ventricle has mildly decreased function. The left ventricle demonstrates global hypokinesis. The left ventricular internal cavity size was normal in size. There is  no left ventricular hypertrophy. Left ventricular diastolic function could not be evaluated due to atrial fibrillation. Left ventricular diastolic parameters are indeterminate. Right Ventricle: The right ventricular size is normal. No increase in right ventricular wall thickness. Right ventricular systolic function is normal. There is mildly elevated pulmonary artery systolic pressure. The tricuspid regurgitant velocity is 3.23  m/s, and with an assumed right atrial pressure of 3 mmHg, the estimated right ventricular systolic pressure is 44.7 mmHg. Left Atrium: Left atrial size was  mild to moderately dilated. Right Atrium: Right atrial size was moderately dilated. Pericardium: There is no evidence of pericardial effusion. Mitral Valve: The mitral valve is normal in structure. Trivial mitral valve regurgitation. No evidence of mitral valve stenosis. Tricuspid Valve: The tricuspid valve is normal in structure. Tricuspid valve regurgitation  is mild . No evidence of tricuspid stenosis. Aortic Valve: The aortic valve is tricuspid. Aortic valve regurgitation is not visualized. No aortic stenosis is present. Pulmonic Valve: The pulmonic valve was normal in structure. Pulmonic valve regurgitation is not visualized. No evidence of pulmonic stenosis. Aorta: The aortic root is normal in size and structure. Venous: The inferior vena cava is normal in size with greater than 50% respiratory variability, suggesting right atrial pressure of 3 mmHg. IAS/Shunts: No atrial level shunt detected by color flow Doppler.  LEFT VENTRICLE PLAX 2D LVIDd:         5.10 cm LVIDs:         3.90 cm LV PW:         0.70 cm LV IVS:        0.70 cm LVOT diam:     2.20 cm LV SV:         45 LV SV Index:   23 LVOT Area:     3.80 cm  RIGHT VENTRICLE             IVC RV Basal diam:  3.90 cm     IVC diam: 1.90 cm RV S prime:     10.55 cm/s TAPSE (M-mode): 1.6 cm LEFT ATRIUM             Index        RIGHT ATRIUM           Index LA diam:        4.60 cm 2.35 cm/m   RA Area:     21.00 cm LA Vol (A2C):   79.5 ml 40.60 ml/m  RA Volume:   68.20 ml  34.83 ml/m LA Vol (A4C):   48.8 ml 24.92 ml/m LA Biplane Vol: 65.4 ml 33.40 ml/m  AORTIC VALVE LVOT Vmax:   82.40 cm/s LVOT Vmean:  54.500 cm/s LVOT VTI:    0.117 m  AORTA Ao Root diam: 3.50 cm Ao Asc diam:  3.60 cm MV E velocity: 98.52 cm/s  TRICUSPID VALVE                            TR Peak grad:   41.7 mmHg                            TR Vmax:        323.00 cm/s                             SHUNTS                            Systemic VTI:  0.12 m                            Systemic Diam: 2.20  cm Chilton Si MD Electronically signed by Chilton Si MD Signature Date/Time: 04/30/2023/8:42:18 PM    Final      TODAY-DAY OF DISCHARGE:  Subjective:   Johnny Rodriguez today has no headache,no chest abdominal pain,no new weakness tingling or numbness, feels much better wants to go home today.   Objective:   Blood pressure (!) 85/70, pulse 84, temperature 97.8 F (36.6 C), temperature source Axillary, resp. rate 20, height 5\' 11"  (1.803 m), weight 78 kg, SpO2 92%.  Intake/Output Summary (  Last 24 hours) at 05/16/2023 0838 Last data filed at 05/15/2023 2209 Gross per 24 hour  Intake --  Output 1500 ml  Net -1500 ml   Filed Weights   05/15/23 0500 05/15/23 2208 05/16/23 0500  Weight: 80.1 kg 77.7 kg 78 kg    Exam: Awake Alert, Oriented *3, No new F.N deficits, Normal affect [place].AT,PERRAL Supple Neck,No JVD, No cervical lymphadenopathy appriciated.  Symmetrical Chest wall movement, Good air movement bilaterally, CTAB RRR,No Gallops,Rubs or new Murmurs, No Parasternal Heave +ve B.Sounds, Abd Soft, Non tender, No organomegaly appriciated, No rebound -guarding or rigidity. No Cyanosis, Clubbing or edema, No new Rash or bruise   PERTINENT RADIOLOGIC STUDIES: No results found.   PERTINENT LAB RESULTS: CBC: Recent Labs    05/15/23 1058  WBC 10.7*  HGB 13.6  HCT 44.9  PLT 236   CMET CMP     Component Value Date/Time   NA 139 05/16/2023 0533   NA 147 (H) 08/15/2022 1420   NA 139 02/05/2014 2217   K 4.4 05/16/2023 0533   K 4.2 02/05/2014 2217   CL 96 (L) 05/16/2023 0533   CL 104 02/05/2014 2217   CO2 35 (H) 05/16/2023 0533   CO2 28 02/05/2014 2217   GLUCOSE 114 (H) 05/16/2023 0533   GLUCOSE 91 02/05/2014 2217   BUN 31 (H) 05/16/2023 0533   BUN 16 08/15/2022 1420   BUN 18 02/05/2014 2217   CREATININE 0.87 05/16/2023 0533   CREATININE 1.10 02/05/2014 2217   CALCIUM 9.4 05/16/2023 0533   CALCIUM 8.8 02/05/2014 2217   PROT 6.3 (L) 05/07/2023 1920   PROT 6.6  08/15/2022 1420   ALBUMIN 2.4 (L) 05/07/2023 1920   ALBUMIN 4.0 08/15/2022 1420   AST 166 (H) 05/07/2023 1920   ALT 517 (H) 05/07/2023 1920   ALKPHOS 52 05/07/2023 1920   BILITOT 0.7 05/07/2023 1920   BILITOT 0.4 08/15/2022 1420   GFR 65.71 12/10/2011 1155   EGFR 81 08/15/2022 1420   GFRNONAA >60 05/16/2023 0533   GFRNONAA >60 02/05/2014 2217    GFR Estimated Creatinine Clearance: 82.9 mL/min (by C-G formula based on SCr of 0.87 mg/dL). No results for input(s): "LIPASE", "AMYLASE" in the last 72 hours. No results for input(s): "CKTOTAL", "CKMB", "CKMBINDEX", "TROPONINI" in the last 72 hours. Invalid input(s): "POCBNP" No results for input(s): "DDIMER" in the last 72 hours. No results for input(s): "HGBA1C" in the last 72 hours. No results for input(s): "CHOL", "HDL", "LDLCALC", "TRIG", "CHOLHDL", "LDLDIRECT" in the last 72 hours. No results for input(s): "TSH", "T4TOTAL", "T3FREE", "THYROIDAB" in the last 72 hours.  Invalid input(s): "FREET3" No results for input(s): "VITAMINB12", "FOLATE", "FERRITIN", "TIBC", "IRON", "RETICCTPCT" in the last 72 hours. Coags: No results for input(s): "INR" in the last 72 hours.  Invalid input(s): "PT" Microbiology: Recent Results (from the past 240 hours)  Resp panel by RT-PCR (RSV, Flu A&B, Covid) Anterior Nasal Swab     Status: None   Collection Time: 05/06/23  2:00 PM   Specimen: Anterior Nasal Swab  Result Value Ref Range Status   SARS Coronavirus 2 by RT PCR NEGATIVE NEGATIVE Final   Influenza A by PCR NEGATIVE NEGATIVE Final   Influenza B by PCR NEGATIVE NEGATIVE Final    Comment: (NOTE) The Xpert Xpress SARS-CoV-2/FLU/RSV plus assay is intended as an aid in the diagnosis of influenza from Nasopharyngeal swab specimens and should not be used as a sole basis for treatment. Nasal washings and aspirates are unacceptable for Xpert Xpress SARS-CoV-2/FLU/RSV testing.  Fact Sheet for  Patients: BloggerCourse.com  Fact Sheet for Healthcare Providers: SeriousBroker.it  This test is not yet approved or cleared by the [place] FDA and has been authorized for detection and/or diagnosis of SARS-CoV-2 by FDA under an Emergency Use Authorization (EUA). This EUA will remain in effect (meaning this test can be used) for the duration of the COVID-19 declaration under Section 564(b)(1) of the Act, 21 U.S.C. section 360bbb-3(b)(1), unless the authorization is terminated or revoked.     Resp Syncytial Virus by PCR NEGATIVE NEGATIVE Final    Comment: (NOTE) Fact Sheet for Patients: BloggerCourse.com  Fact Sheet for Healthcare Providers: SeriousBroker.it  This test is not yet approved or cleared by the [place] FDA and has been authorized for detection and/or diagnosis of SARS-CoV-2 by FDA under an Emergency Use Authorization (EUA). This EUA will remain in effect (meaning this test can be used) for the duration of the COVID-19 declaration under Section 564(b)(1) of the Act, 21 U.S.C. section 360bbb-3(b)(1), unless the authorization is terminated or revoked.  Performed at [facility], [address] [place], [place] [address]   MRSA Next Gen by PCR, Nasal     Status: None   Collection Time: 05/06/23  5:15 PM   Specimen: Nasal Mucosa; Nasal Swab  Result Value Ref Range Status   MRSA by PCR Next Gen NOT DETECTED NOT DETECTED Final    Comment: (NOTE) The GeneXpert MRSA Assay (FDA approved for NASAL specimens only), is one component of a comprehensive MRSA colonization surveillance program. It is not intended to diagnose MRSA infection nor to guide or monitor treatment for MRSA infections. Test performance is not FDA approved in patients less than 17 years old. Performed at [facility], [address] [place], [place] [address]   Culture,  Respiratory w Gram Stain     Status: None   Collection Time: 05/06/23  9:36 PM   Specimen: Tracheal Aspirate; Respiratory  Result Value Ref Range Status   Specimen Description TRACHEAL ASPIRATE  Final   Special Requests Normal  Final   Gram Stain   Final    MODERATE WBC PRESENT, PREDOMINANTLY PMN MODERATE GRAM NEGATIVE RODS FEW GRAM POSITIVE COCCI IN PAIRS    Culture   Final    MODERATE HAEMOPHILUS INFLUENZAE BETA LACTAMASE POSITIVE Performed at [facility], [address] [place], [place] [address]    Report Status 05/09/2023 FINAL  Final  Respiratory (~20 pathogens) panel by PCR     Status: None   Collection Time: 05/06/23  9:36 PM   Specimen: Nasopharyngeal Swab; Respiratory  Result Value Ref Range Status   Adenovirus NOT DETECTED NOT DETECTED Final   Coronavirus 229E NOT DETECTED NOT DETECTED Final    Comment: (NOTE) The Coronavirus on the Respiratory Panel, DOES NOT test for the novel  Coronavirus (2019 nCoV)    Coronavirus HKU1 NOT DETECTED NOT DETECTED Final   Coronavirus NL63 NOT DETECTED NOT DETECTED Final   Coronavirus OC43 NOT DETECTED NOT DETECTED Final   Metapneumovirus NOT DETECTED NOT DETECTED Final   Rhinovirus / Enterovirus NOT DETECTED NOT DETECTED Final   Influenza A NOT DETECTED NOT DETECTED Final   Influenza B NOT DETECTED NOT DETECTED Final   Parainfluenza Virus 1 NOT DETECTED NOT DETECTED Final   Parainfluenza Virus 2 NOT DETECTED NOT DETECTED Final   Parainfluenza Virus 3 NOT DETECTED NOT DETECTED Final   Parainfluenza Virus 4 NOT DETECTED NOT DETECTED Final   Respiratory Syncytial Virus NOT  DETECTED NOT DETECTED Final   Bordetella pertussis NOT DETECTED NOT DETECTED Final   Bordetella Parapertussis NOT DETECTED NOT DETECTED Final   Chlamydophila pneumoniae NOT DETECTED NOT DETECTED Final   Mycoplasma pneumoniae NOT DETECTED NOT DETECTED Final    Comment: Performed at [facility], [address], [place], [place] [address]     FURTHER DISCHARGE INSTRUCTIONS:  Get Medicines reviewed and adjusted: Please take all your medications with you for your next visit with your Primary MD  Laboratory/radiological data: Please request your Primary MD to go over all hospital tests and procedure/radiological results at the follow up, please ask your Primary MD to get all Hospital records sent to his/her office.  In some cases, they will be blood work, cultures and biopsy results pending at the time of your discharge. Please request that your primary care M.D. goes through all the records of your hospital data and follows up on these results.  Also Note the following: If you experience worsening of your admission symptoms, develop shortness of breath, life threatening emergency, suicidal or homicidal thoughts you must seek medical attention immediately by calling 911 or calling your MD immediately  if symptoms less severe.  You must read complete instructions/literature along with all the possible adverse reactions/side effects for all the Medicines you take and that have been prescribed to you. Take any new Medicines after you have completely understood and accpet all the possible adverse reactions/side effects.   Do not drive when taking Pain medications or sleeping medications (Benzodaizepines)  Do not take more than prescribed Pain, Sleep and Anxiety Medications. It is not advisable to combine anxiety,sleep and pain medications without talking with your primary care practitioner  Special Instructions: If you have smoked or chewed Tobacco  in the last 2 yrs please stop smoking, stop any regular Alcohol  and or any Recreational drug use.  Wear Seat belts while driving.  Please note: You were cared for by a hospitalist during your hospital stay. Once you are discharged, your primary care physician will handle any further medical issues. Please note that NO REFILLS for any discharge medications will be authorized once you  are discharged, as it is imperative that you return to your primary care physician (or establish a relationship with a primary care physician if you do not have one) for your post hospital discharge needs so that they can reassess your need for medications and monitor your lab values.  Total Time spent coordinating discharge including counseling, education and face to face time equals greater than 30 minutes.  SignedJeoffrey Massed 05/16/2023 8:38 AM

## 2023-05-18 ENCOUNTER — Emergency Department (HOSPITAL_COMMUNITY)

## 2023-05-18 ENCOUNTER — Encounter (HOSPITAL_COMMUNITY): Payer: Self-pay | Admitting: *Deleted

## 2023-05-18 ENCOUNTER — Other Ambulatory Visit: Payer: Self-pay

## 2023-05-18 ENCOUNTER — Inpatient Hospital Stay (HOSPITAL_COMMUNITY)
Admission: EM | Admit: 2023-05-18 | Discharge: 2023-05-21 | DRG: 189 | Disposition: A | Discharge status: Home Health | Attending: Internal Medicine | Admitting: Internal Medicine

## 2023-05-18 DIAGNOSIS — E440 Moderate protein-calorie malnutrition: Secondary | ICD-10-CM | POA: Diagnosis present

## 2023-05-18 DIAGNOSIS — Z7951 Long term (current) use of inhaled steroids: Secondary | ICD-10-CM

## 2023-05-18 DIAGNOSIS — F17210 Nicotine dependence, cigarettes, uncomplicated: Secondary | ICD-10-CM | POA: Diagnosis present

## 2023-05-18 DIAGNOSIS — G894 Chronic pain syndrome: Secondary | ICD-10-CM | POA: Diagnosis present

## 2023-05-18 DIAGNOSIS — Z1152 Encounter for screening for COVID-19: Secondary | ICD-10-CM

## 2023-05-18 DIAGNOSIS — R0603 Acute respiratory distress: Secondary | ICD-10-CM | POA: Diagnosis not present

## 2023-05-18 DIAGNOSIS — J441 Chronic obstructive pulmonary disease with (acute) exacerbation: Principal | ICD-10-CM | POA: Diagnosis present

## 2023-05-18 DIAGNOSIS — J9621 Acute and chronic respiratory failure with hypoxia: Secondary | ICD-10-CM | POA: Diagnosis not present

## 2023-05-18 DIAGNOSIS — Z825 Family history of asthma and other chronic lower respiratory diseases: Secondary | ICD-10-CM

## 2023-05-18 DIAGNOSIS — J9622 Acute and chronic respiratory failure with hypercapnia: Secondary | ICD-10-CM | POA: Diagnosis present

## 2023-05-18 DIAGNOSIS — I429 Cardiomyopathy, unspecified: Secondary | ICD-10-CM | POA: Diagnosis present

## 2023-05-18 DIAGNOSIS — I444 Left anterior fascicular block: Secondary | ICD-10-CM | POA: Diagnosis present

## 2023-05-18 DIAGNOSIS — Z7982 Long term (current) use of aspirin: Secondary | ICD-10-CM

## 2023-05-18 DIAGNOSIS — I10 Essential (primary) hypertension: Secondary | ICD-10-CM | POA: Diagnosis present

## 2023-05-18 DIAGNOSIS — E875 Hyperkalemia: Secondary | ICD-10-CM | POA: Diagnosis present

## 2023-05-18 DIAGNOSIS — T380X5A Adverse effect of glucocorticoids and synthetic analogues, initial encounter: Secondary | ICD-10-CM | POA: Diagnosis present

## 2023-05-18 DIAGNOSIS — R5381 Other malaise: Secondary | ICD-10-CM | POA: Diagnosis present

## 2023-05-18 DIAGNOSIS — Z79899 Other long term (current) drug therapy: Secondary | ICD-10-CM

## 2023-05-18 DIAGNOSIS — Z86711 Personal history of pulmonary embolism: Secondary | ICD-10-CM

## 2023-05-18 DIAGNOSIS — Z6822 Body mass index (BMI) 22.0-22.9, adult: Secondary | ICD-10-CM

## 2023-05-18 DIAGNOSIS — Z8249 Family history of ischemic heart disease and other diseases of the circulatory system: Secondary | ICD-10-CM

## 2023-05-18 DIAGNOSIS — I2510 Atherosclerotic heart disease of native coronary artery without angina pectoris: Secondary | ICD-10-CM | POA: Diagnosis present

## 2023-05-18 DIAGNOSIS — Z9981 Dependence on supplemental oxygen: Secondary | ICD-10-CM

## 2023-05-18 DIAGNOSIS — I482 Chronic atrial fibrillation, unspecified: Secondary | ICD-10-CM | POA: Diagnosis present

## 2023-05-18 DIAGNOSIS — J439 Emphysema, unspecified: Secondary | ICD-10-CM | POA: Diagnosis present

## 2023-05-18 DIAGNOSIS — Z823 Family history of stroke: Secondary | ICD-10-CM

## 2023-05-18 DIAGNOSIS — I480 Paroxysmal atrial fibrillation: Secondary | ICD-10-CM | POA: Diagnosis present

## 2023-05-18 DIAGNOSIS — I110 Hypertensive heart disease with heart failure: Secondary | ICD-10-CM | POA: Diagnosis present

## 2023-05-18 DIAGNOSIS — Z8701 Personal history of pneumonia (recurrent): Secondary | ICD-10-CM

## 2023-05-18 DIAGNOSIS — I5042 Chronic combined systolic (congestive) and diastolic (congestive) heart failure: Secondary | ICD-10-CM | POA: Diagnosis present

## 2023-05-18 DIAGNOSIS — D72828 Other elevated white blood cell count: Secondary | ICD-10-CM | POA: Diagnosis present

## 2023-05-18 DIAGNOSIS — I2699 Other pulmonary embolism without acute cor pulmonale: Secondary | ICD-10-CM | POA: Diagnosis present

## 2023-05-18 DIAGNOSIS — Z7901 Long term (current) use of anticoagulants: Secondary | ICD-10-CM

## 2023-05-18 DIAGNOSIS — I5022 Chronic systolic (congestive) heart failure: Secondary | ICD-10-CM | POA: Diagnosis present

## 2023-05-18 DIAGNOSIS — I493 Ventricular premature depolarization: Secondary | ICD-10-CM | POA: Diagnosis present

## 2023-05-18 DIAGNOSIS — R0602 Shortness of breath: Secondary | ICD-10-CM | POA: Diagnosis not present

## 2023-05-18 LAB — RESP PANEL BY RT-PCR (RSV, FLU A&B, COVID)  RVPGX2
Influenza A by PCR: NEGATIVE
Influenza B by PCR: NEGATIVE
Resp Syncytial Virus by PCR: NEGATIVE
SARS Coronavirus 2 by RT PCR: NEGATIVE

## 2023-05-18 LAB — COMPREHENSIVE METABOLIC PANEL WITH GFR
ALT: 47 U/L — ABNORMAL HIGH (ref 0–44)
AST: 22 U/L (ref 15–41)
Albumin: 3.1 g/dL — ABNORMAL LOW (ref 3.5–5.0)
Alkaline Phosphatase: 49 U/L (ref 38–126)
Anion gap: 6 (ref 5–15)
BUN: 22 mg/dL (ref 8–23)
CO2: 38 mmol/L — ABNORMAL HIGH (ref 22–32)
Calcium: 9.1 mg/dL (ref 8.9–10.3)
Chloride: 94 mmol/L — ABNORMAL LOW (ref 98–111)
Creatinine, Ser: 0.94 mg/dL (ref 0.61–1.24)
GFR, Estimated: >60 mL/min (ref >60)
Glucose, Bld: 122 mg/dL — ABNORMAL HIGH (ref 70–99)
Potassium: 4.6 mmol/L (ref 3.5–5.1)
Sodium: 138 mmol/L (ref 135–145)
Total Bilirubin: 1.3 mg/dL — ABNORMAL HIGH (ref 0.0–1.2)
Total Protein: 6.4 g/dL — ABNORMAL LOW (ref 6.5–8.1)

## 2023-05-18 LAB — CBC WITH DIFFERENTIAL/PLATELET
Abs Immature Granulocytes: 0.04 10*3/uL (ref 0.00–0.07)
Basophils Absolute: 0 10*3/uL (ref 0.0–0.1)
Basophils Relative: 0 %
Eosinophils Absolute: 0 10*3/uL (ref 0.0–0.5)
Eosinophils Relative: 0 %
HCT: 44.4 % (ref 39.0–52.0)
Hemoglobin: 13.7 g/dL (ref 13.0–17.0)
Immature Granulocytes: 0 %
Lymphocytes Relative: 9 %
Lymphs Abs: 1.1 10*3/uL (ref 0.7–4.0)
MCH: 30.7 pg (ref 26.0–34.0)
MCHC: 30.9 g/dL (ref 30.0–36.0)
MCV: 99.6 fL (ref 80.0–100.0)
Monocytes Absolute: 1 10*3/uL (ref 0.1–1.0)
Monocytes Relative: 8 %
Neutro Abs: 9.6 10*3/uL — ABNORMAL HIGH (ref 1.7–7.7)
Neutrophils Relative %: 83 %
Platelets: 259 10*3/uL (ref 150–400)
RBC: 4.46 MIL/uL (ref 4.22–5.81)
RDW: 13.8 % (ref 11.5–15.5)
WBC: 11.8 10*3/uL — ABNORMAL HIGH (ref 4.0–10.5)
nRBC: 0 % (ref 0.0–0.2)

## 2023-05-18 LAB — ETHANOL: Alcohol, Ethyl (B): <10 mg/dL (ref <10)

## 2023-05-18 LAB — TROPONIN I (HIGH SENSITIVITY)
Troponin I (High Sensitivity): 8 ng/L (ref <18)
Troponin I (High Sensitivity): 7 ng/L (ref <18)

## 2023-05-18 LAB — RESPIRATORY PANEL BY PCR

## 2023-05-18 LAB — I-STAT ARTERIAL BLOOD GAS, ED
Acid-Base Excess: 7 mmol/L — ABNORMAL HIGH (ref 0.0–2.0)
Bicarbonate: 35.2 mmol/L — ABNORMAL HIGH (ref 20.0–28.0)
Calcium, Ion: 1.21 mmol/L (ref 1.15–1.40)
HCT: 45 % (ref 39.0–52.0)
Hemoglobin: 15.3 g/dL (ref 13.0–17.0)
O2 Saturation: 95 %
Patient temperature: 36.7
Potassium: 4.7 mmol/L (ref 3.5–5.1)
Sodium: 134 mmol/L — ABNORMAL LOW (ref 135–145)
TCO2: 37 mmol/L — ABNORMAL HIGH (ref 22–32)
pCO2 arterial: 60.9 mmHg — ABNORMAL HIGH (ref 32–48)
pH, Arterial: 7.369 (ref 7.35–7.45)
pO2, Arterial: 80 mmHg — ABNORMAL LOW (ref 83–108)

## 2023-05-18 LAB — BRAIN NATRIURETIC PEPTIDE: B Natriuretic Peptide: 84.5 pg/mL (ref 0.0–100.0)

## 2023-05-18 LAB — MAGNESIUM: Magnesium: 1.5 mg/dL — ABNORMAL LOW (ref 1.7–2.4)

## 2023-05-18 LAB — CREATININE, SERUM
Creatinine, Ser: 0.6 mg/dL — ABNORMAL LOW (ref 0.61–1.24)
GFR, Estimated: >60 mL/min (ref >60)

## 2023-05-18 MED ORDER — HYDRALAZINE HCL 20 MG/ML IJ SOLN: 5.0000 mg | INTRAMUSCULAR | Status: DC | PRN

## 2023-05-18 MED ORDER — MAGNESIUM SULFATE 2 GM/50ML IV SOLN
2.0000 g | Freq: Once | INTRAVENOUS | Status: AC
  Administered 2023-05-18: 2 g via INTRAVENOUS
  Filled 2023-05-18: qty 50

## 2023-05-18 MED ORDER — ONDANSETRON HCL 4 MG/2ML IJ SOLN: 4.0000 mg | Freq: Four times a day (QID) | INTRAMUSCULAR | Status: DC | PRN

## 2023-05-18 MED ORDER — SODIUM CHLORIDE 0.9% FLUSH: 3.0000 mL | INTRAVENOUS | Status: DC | PRN

## 2023-05-18 MED ORDER — SODIUM CHLORIDE 0.9% FLUSH
3.0000 mL | Freq: Two times a day (BID) | INTRAVENOUS | Status: DC
Start: 2023-05-18 — End: 2023-05-21
  Administered 2023-05-18 – 2023-05-21 (×6): 3 mL via INTRAVENOUS

## 2023-05-18 MED ORDER — ALBUTEROL SULFATE (2.5 MG/3ML) 0.083% IN NEBU
10.0000 mg/h | INHALATION_SOLUTION | Freq: Once | RESPIRATORY_TRACT | Status: AC
Start: 2023-05-18 — End: 2023-05-18
  Administered 2023-05-18: 10 mg/h via RESPIRATORY_TRACT
  Filled 2023-05-18: qty 3

## 2023-05-18 MED ORDER — ACETAMINOPHEN 325 MG PO TABS: 650.0000 mg | ORAL_TABLET | Freq: Four times a day (QID) | ORAL | Status: DC | PRN

## 2023-05-18 MED ORDER — MORPHINE SULFATE (PF) 2 MG/ML IV SOLN: 2.0000 mg | INTRAVENOUS | Status: DC | PRN

## 2023-05-18 MED ORDER — BISACODYL 5 MG PO TBEC: 5.0000 mg | DELAYED_RELEASE_TABLET | Freq: Every day | ORAL | Status: DC | PRN

## 2023-05-18 MED ORDER — ACETAMINOPHEN 650 MG RE SUPP: 650.0000 mg | Freq: Four times a day (QID) | RECTAL | Status: DC | PRN

## 2023-05-18 MED ORDER — HYDROCODONE-ACETAMINOPHEN 5-325 MG PO TABS: 1.0000 | ORAL_TABLET | ORAL | Status: DC | PRN

## 2023-05-18 MED ORDER — NICOTINE 14 MG/24HR TD PT24
14.0000 mg | MEDICATED_PATCH | Freq: Every day | TRANSDERMAL | Status: DC
  Filled 2023-05-18 (×2): qty 1

## 2023-05-18 MED ORDER — DOXYCYCLINE HYCLATE 100 MG PO TABS
100.0000 mg | ORAL_TABLET | Freq: Two times a day (BID) | ORAL | Status: DC
  Administered 2023-05-18 – 2023-05-20 (×5): 100 mg via ORAL
  Filled 2023-05-18 (×5): qty 1

## 2023-05-18 MED ORDER — THIAMINE HCL 100 MG/ML IJ SOLN
100.0000 mg | Freq: Every day | INTRAMUSCULAR | Status: DC
  Administered 2023-05-18 – 2023-05-21 (×4): 100 mg via INTRAVENOUS
  Filled 2023-05-18 (×4): qty 2

## 2023-05-18 MED ORDER — ONDANSETRON HCL 4 MG PO TABS: 4.0000 mg | ORAL_TABLET | Freq: Four times a day (QID) | ORAL | Status: DC | PRN

## 2023-05-18 MED ORDER — POLYETHYLENE GLYCOL 3350 17 G PO PACK: 17.0000 g | PACK | Freq: Every day | ORAL | Status: DC | PRN

## 2023-05-18 MED ORDER — IPRATROPIUM-ALBUTEROL 0.5-2.5 (3) MG/3ML IN SOLN
3.0000 mL | Freq: Four times a day (QID) | RESPIRATORY_TRACT | Status: DC
  Administered 2023-05-18 – 2023-05-19 (×4): 3 mL via RESPIRATORY_TRACT
  Filled 2023-05-18 (×5): qty 3

## 2023-05-18 MED ORDER — GUAIFENESIN ER 600 MG PO TB12: 600.0000 mg | ORAL_TABLET | Freq: Two times a day (BID) | ORAL | Status: DC | PRN

## 2023-05-18 MED ORDER — IPRATROPIUM-ALBUTEROL 0.5-2.5 (3) MG/3ML IN SOLN
3.0000 mL | Freq: Once | RESPIRATORY_TRACT | Status: AC
  Administered 2023-05-18: 3 mL via RESPIRATORY_TRACT
  Filled 2023-05-18: qty 3

## 2023-05-18 MED ORDER — SODIUM CHLORIDE 0.9% FLUSH
3.0000 mL | Freq: Two times a day (BID) | INTRAVENOUS | Status: DC
  Administered 2023-05-18 – 2023-05-19 (×2): 10 mL via INTRAVENOUS
  Administered 2023-05-19 – 2023-05-20 (×2): 3 mL via INTRAVENOUS
  Administered 2023-05-20: 10 mL via INTRAVENOUS
  Administered 2023-05-21: 3 mL via INTRAVENOUS

## 2023-05-18 MED ORDER — METHYLPREDNISOLONE SODIUM SUCC 125 MG IJ SOLR
60.0000 mg | Freq: Two times a day (BID) | INTRAMUSCULAR | Status: AC
  Administered 2023-05-18 – 2023-05-19 (×2): 60 mg via INTRAVENOUS
  Filled 2023-05-18 (×2): qty 2

## 2023-05-18 MED ORDER — PANTOPRAZOLE SODIUM 40 MG IV SOLR
40.0000 mg | Freq: Two times a day (BID) | INTRAVENOUS | Status: DC
  Administered 2023-05-18 – 2023-05-21 (×7): 40 mg via INTRAVENOUS
  Filled 2023-05-18 (×7): qty 10

## 2023-05-18 MED ORDER — PREDNISONE 20 MG PO TABS: 40.0000 mg | ORAL_TABLET | Freq: Every day | ORAL | Status: DC

## 2023-05-18 MED ORDER — DOCUSATE SODIUM 100 MG PO CAPS
100.0000 mg | ORAL_CAPSULE | Freq: Two times a day (BID) | ORAL | Status: DC
  Administered 2023-05-18 – 2023-05-20 (×5): 100 mg via ORAL
  Filled 2023-05-18 (×6): qty 1

## 2023-05-18 MED ORDER — ALBUTEROL SULFATE (2.5 MG/3ML) 0.083% IN NEBU: 2.5000 mg | INHALATION_SOLUTION | RESPIRATORY_TRACT | Status: DC | PRN

## 2023-05-18 NOTE — ED Triage Notes (Signed)
 Patient presents to ed via [facility] from home c/o sob per ems the was in tripod position with sats 72%, patient was given duo neb , 125 solu-medrol , 2 g Mag. Per ems states he is feeling a little better. States he was just discharged from hospital  on Sat. For same was in ICU, patient states he still smokes , c/o left shoulder pain of which is chronic.

## 2023-05-18 NOTE — Assessment & Plan Note (Signed)
Nutrition consult

## 2023-05-18 NOTE — Assessment & Plan Note (Signed)
 Cont eliquis

## 2023-05-18 NOTE — ED Provider Notes (Signed)
 [place] EMERGENCY DEPARTMENT AT [facility] Provider Note   CSN: [id number] Arrival date & time: 05/18/23  9528     History Chief Complaint  Patient presents with   Shortness of Breath    Johnny Rodriguez is a 73 y.o. male history of COPD, CHF, idiopathic cardiomyopathy, A-fib on Eliquis, CAD, tobacco use presents emerged part today for evaluation of shortness of breath since 2200 last night.  Patient reports that he tried to go to sleep however woke in still continued to have difficulty breathing.  Denies any fever or cough or cold symptoms.  Reports has been compliant with his oxygen however does turn this off to smoke.  Not having any chest pain.  He was given Solu-Medrol, magnesium, and 2 breathing treatments with EMS.  Still having some shortness of breath but does feel like he is breathing better.   Shortness of Breath Associated symptoms: no abdominal pain, no chest pain, no cough, no fever and no vomiting        Home Medications Prior to Admission medications   Medication Sig Start Date End Date Taking? Authorizing Provider  albuterol (PROVENTIL) (2.5 MG/3ML) 0.083% nebulizer solution Take 3 mLs (2.5 mg total) by nebulization every 4 (four) hours as needed for wheezing or shortness of breath. 05/16/23   Ghimire, Werner Lean, MD  aspirin EC 81 MG tablet Take 1 tablet (81 mg total) by mouth daily. Swallow whole. 12/08/22   Ghimire, Werner Lean, MD  ELIQUIS 5 MG TABS tablet Take 1 tablet (5 mg total) by mouth 2 (two) times daily. 05/16/23   Ghimire, Werner Lean, MD  fluticasone furoate-vilanterol (BREO ELLIPTA) 100-25 MCG/ACT AEPB Inhale 1 puff into the lungs daily. 05/16/23   Ghimire, Werner Lean, MD  folic acid (FOLVITE) 1 MG tablet Take 1 tablet (1 mg total) by mouth daily. 05/16/23   Ghimire, Werner Lean, MD  gabapentin (NEURONTIN) 400 MG capsule Take 1 capsule (400 mg total) by mouth daily as needed (for nerve pain). 05/16/23   Ghimire, Werner Lean, MD  linaclotide Karlene Einstein) 145 MCG  CAPS capsule Take 1 capsule (145 mcg total) by mouth daily before breakfast. 05/16/23   Ghimire, Werner Lean, MD  metoprolol succinate (TOPROL-XL) 50 MG 24 hr tablet Take 1 tablet (50 mg total) by mouth daily. Take with or immediately following a meal. 05/16/23   Ghimire, Werner Lean, MD  OXYGEN Inhale 3 L into the lungs continuous.     [provider]  pantoprazole (PROTONIX) 40 MG tablet Take 1 tablet (40 mg total) by mouth daily as needed (acid reflux). 05/16/23   Ghimire, Werner Lean, MD  polyethylene glycol (MIRALAX / GLYCOLAX) 17 g packet Take 17 g by mouth daily. Patient taking differently: Take 17 g by mouth daily as needed for mild constipation. 03/15/19   Dellia Cloud, MD  senna (SENOKOT) 8.6 MG TABS tablet Take 1-2 tablets (8.6-17.2 mg total) by mouth daily. Patient taking differently: Take 1-2 tablets by mouth daily as needed for moderate constipation. 03/15/19   Dellia Cloud, MD  SPIRIVA RESPIMAT 2.5 MCG/ACT AERS Inhale 2 puffs into the lungs daily. 05/16/23   Ghimire, Werner Lean, MD  traZODone (DESYREL) 50 MG tablet Take 1 tablet (50 mg total) by mouth at bedtime as needed for sleep. 05/16/23   Ghimire, Werner Lean, MD  VENTOLIN HFA 108 (90 Base) MCG/ACT inhaler Inhale 1-2 puffs into the lungs every 6 (six) hours as needed for wheezing or shortness of breath 05/16/23   Ghimire, Werner Lean,  MD      Allergies    Patient has no known allergies.    Review of Systems   Review of Systems  Constitutional:  Negative for chills and fever.  HENT:  Negative for congestion and rhinorrhea.   Respiratory:  Positive for shortness of breath. Negative for cough.   Cardiovascular:  Negative for chest pain, palpitations and leg swelling.  Gastrointestinal:  Negative for abdominal pain, nausea and vomiting.    Physical Exam Updated Vital Signs BP (!) 118/98   Pulse 97   Temp 98 F (36.7 C) (Oral)   Resp (!) 27   Ht 5\' 11"  (1.803 m)   Wt 72.6 kg   SpO2 95%   BMI 22.32 kg/m  Physical Exam Vitals and  nursing note reviewed.  Constitutional:      Appearance: He is ill-appearing.  Cardiovascular:     Rate and Rhythm: Normal rate. Rhythm irregular.     Pulses:          Radial pulses are 2+ on the right side and 2+ on the left side.       Dorsalis pedis pulses are 2+ on the right side and 2+ on the left side.       Posterior tibial pulses are 2+ on the right side and 2+ on the left side.  Pulmonary:     Effort: Tachypnea present.     Comments: Patient's lung sounds are diminished and tight in the lower lung fields with does have slight expiratory wheeze in the upper lung fields bilaterally.  Patient is slightly tripoding by leaning forward and is mildly tachypneic.  Speaking in few words. Abdominal:     Palpations: Abdomen is soft.  Musculoskeletal:     Right lower leg: No tenderness. No edema.     Left lower leg: No tenderness. No edema.  Skin:    General: Skin is warm and dry.  Neurological:     Mental Status: He is alert.     ED Results / Procedures / Treatments   Labs (all labs ordered are listed, but only abnormal results are displayed) Labs Reviewed  CBC WITH DIFFERENTIAL/PLATELET - Abnormal; Notable for the following components:      Result Value   WBC 11.8 (*)    Neutro Abs 9.6 (*)    All other components within normal limits  COMPREHENSIVE METABOLIC PANEL WITH GFR - Abnormal; Notable for the following components:   Chloride 94 (*)    CO2 38 (*)    Glucose, Bld 122 (*)    Total Protein 6.4 (*)    Albumin 3.1 (*)    ALT 47 (*)    Total Bilirubin 1.3 (*)    All other components within normal limits  RESP PANEL BY RT-PCR (RSV, FLU A&B, COVID)  [id number]  BRAIN NATRIURETIC PEPTIDE  TROPONIN I (HIGH SENSITIVITY)    EKG None  Radiology DG Chest Portable 1 View Result Date: 05/18/2023 CLINICAL DATA:  73 year old male with shortness of breath, respiratory distress. EXAM: PORTABLE CHEST 1 VIEW COMPARISON:  Portable chest 05/12/2023.  CTA chest 05/13/2023. FINDINGS:  Portable AP views at 0700 hours. Hyperinflated lungs. Centrilobular emphysema and small layering left pleural effusion on the recent CTA. Lung volumes and ventilation stable since 05/12/2023. No pneumothorax, pulmonary edema, new lung opacity. Normal cardiac size and mediastinal contours. Visualized tracheal air column is within normal limits. Stable visualized osseous structures. Negative visible bowel gas. IMPRESSION: Emphysema (ICD10-J43.9) and small left pleural effusion stable from recent CTA. No  new cardiopulmonary abnormality. Electronically Signed   By: Odessa Fleming M.D.   On: 05/18/2023 07:08    Procedures .Critical Care  Performed by: Achille Rich, PA-C Authorized by: Achille Rich, PA-C   Critical care provider statement:    Critical care time (minutes):  30   Critical care was necessary to treat or prevent imminent or life-threatening deterioration of the following conditions:  Respiratory failure (COPD exacerabtion with hypoxia)   Critical care was time spent personally by me on the following activities:  Development of treatment plan with patient or surrogate, discussions with consultants, evaluation of patient's response to treatment, examination of patient, ordering and review of laboratory studies, ordering and review of radiographic studies, ordering and performing treatments and interventions, pulse oximetry, re-evaluation of patient's condition, review of old charts and obtaining history from patient or surrogate   Care discussed with: admitting provider   Comments:     COPD exacerbation with hypoxia requiring multiple breathing treatments, continuous neb treatment, multiple reevaluations, and 10 L high flow nasal cannula.    Medications Ordered in ED Medications  ipratropium-albuterol (DUONEB) 0.5-2.5 (3) MG/3ML nebulizer solution 3 mL (3 mLs Nebulization Given 05/18/23 0725)  albuterol (PROVENTIL) (2.5 MG/3ML) 0.083% nebulizer solution (10 mg/hr Nebulization Given 05/18/23 0725)     ED Course/ Medical Decision Making/ A&P Clinical Course as of 05/18/23 0913  Mon May 18, 2023  0652 Patient is around 86-90% on his at home 3L. Will increase to 4L. [RR]  [id number] On re-evaluation, the patient does appear that he is breathing better and is feeling better however is on 10L HFNC [RR]    Clinical Course User Index [RR] Achille Rich, PA-C    Medical Decision Making Amount and/or Complexity of Data Reviewed Labs: ordered. Radiology: ordered.  Risk Prescription drug management. Decision regarding hospitalization.   73 y.o. male presents to the ER for evaluation of SOB. Differential diagnosis includes but is not limited to CHF, pericardial effusion/tamponade, arrhythmias, ACS, COPD, asthma, bronchitis, pneumonia, pneumothorax, PE, anemia. Vital signs BP 118/98, RR 27 at 95% on 4L. Physical exam as noted above.   On previous chart evaluation, patient was recently just discharged after COPD exacerbation.  Was admitted from 05-06-2023 to 05-16-2023.  He was intubated and diagnosed with H. influenzae pneumonia.  Patient was given 2 other DuoNebs.  Appears significantly better, I did increase him to 4 L for comfort.  Will continue with labs and imaging.  I independently reviewed and interpreted the patient's labs.  CBC shows mild leukocytosis 11.8 with a left shift.  CMP shows chloride 94, bicarb at 38 with glucose of 122.  Appears patient chronic bicarb is at 35-36.  Total protein and albumin mildly decreased.  ALT mildly increased.  Total bili at 1.3.  No other electrolyte or LFT abnormality.  Initial troponin at 8 with repeat at 7.  Respiratory panel negative for COVID, flu, RSV.  BNP within normal limits.  EKG reviewed and interpreted by my attending and reports it is similar to previous EKGs.  He has received 2 breathing treatments, Solu-Medrol, mag of EMS and has received 2 DuoNebs here as well as continuous albuterol and is still having hypoxia with increased work of  breathing.  Respiratory has assessed the patient at bedside and placed them on 10 L with moist air HFNC.  Given the patient's increased oxygen requirement, will need admission.  He does appear comfortable on the 10 L.  Discussed need for admission with patient when she is amenable to.  Admit to Dr. Allena Katz.  Portions of this report may have been transcribed using voice recognition software. Every effort was made to ensure accuracy; however, inadvertent computerized transcription errors may be present.   Final Clinical Impression(s) / ED Diagnoses Final diagnoses:  COPD exacerbation [facility])    Rx / DC Orders ED Discharge Orders     None         Achille Rich, PA-C 05/18/23 1559    Eber Hong, MD 05/21/23 1027

## 2023-05-18 NOTE — Assessment & Plan Note (Signed)
 Vitals:   05/18/23 0800 05/18/23 0830 05/18/23 0900 05/18/23 0930  BP: 130/77 120/72 (!) 106/55 123/71   05/18/23 1000 05/18/23 1030 05/18/23 1100 05/18/23 1130  BP: 107/77 118/68 109/75 109/75   05/18/23 1200 05/18/23 1230 05/18/23 1300 05/18/23 1330  BP: 105/67 106/68 106/76 112/78  Home regimen includes metoprolol 50 mg.  Blood pressures are low.  Will discuss with patient to limit Linzess on a as needed basis to avoid dizziness and any falls 2/2 any orthostatic changes or blood pressure fluctuations.

## 2023-05-18 NOTE — Assessment & Plan Note (Signed)
 No c/o chest pain or pressure. He reported chest tightness with cough

## 2023-05-18 NOTE — H&P (Signed)
 History and Physical    Patient: Johnny Rodriguez ZOX:[id number] DOB: [birth year] DOA: 05/18/2023 DOS: the patient was seen and examined on 05/18/2023 PCP: Rema Fendt, NP  Patient coming from: Home Chief complaint: Chief Complaint  Patient presents with   Shortness of Breath   HPI:  Johnny Rodriguez is a 73 y.o. male with past medical history  of  COPD on 3 L of oxygen at home, HFrEF, PAF on eliqus , HTN- recently admitted on 3/26 for respiratory distress- h.flu pna/ intubated and in icu and extubated on 3/28 and transferred to [facility] on 3/30 presents again with sob  since yesterday after being discharged on 05/16/2023 and was in acute respiratory distress and o2 sats in 70's with EMS where he got solumedrol 125/ duoneb/ mag 2 gm. In ed pt is alert and oriented and denies any chest pain, fever chill N,V.  ED Course: Pt in ed at bedside  is stable on 10 L.ABG is requested and pending.  Vital signs in the ED were notable for the following:  Vitals:   05/18/23 1200 05/18/23 1230 05/18/23 1300 05/18/23 1330  BP: 105/67 106/68 106/76 112/78  Pulse: (!) 103 90 69 91  Temp:      Resp: (!) 24 19 (!) 30 (!) 32  Height:      Weight:      SpO2: 100% 98% 94% 99%  TempSrc:      BMI (Calculated):       >>ED evaluation thus far shows: ABG show PCO2 of  60.9 CMP with normal potassium and glucose 122, normal kidney function ast of 22 and alt of 47.  Normal BNP and TNI.  Negative viral panel for flu/rsv and covid.  Chest xray shows small pleural effusion and emphysema.  >>While in the ED patient received the following: Medications  thiamine (VITAMIN B1) injection 100 mg (100 mg Intravenous Given 05/18/23 1350)  pantoprazole (PROTONIX) injection 40 mg (40 mg Intravenous Given 05/18/23 1351)  methylPREDNISolone sodium succinate (SOLU-MEDROL) 125 mg/2 mL injection 60 mg (has no administration in time range)    Followed by  predniSONE (DELTASONE) tablet 40 mg (has no administration in time range)   ipratropium-albuterol (DUONEB) 0.5-2.5 (3) MG/3ML nebulizer solution 3 mL (has no administration in time range)  albuterol (PROVENTIL) (2.5 MG/3ML) 0.083% nebulizer solution 2.5 mg (has no administration in time range)  sodium chloride flush (NS) 0.9 % injection 3 mL (has no administration in time range)  acetaminophen (TYLENOL) tablet 650 mg (has no administration in time range)    Or  acetaminophen (TYLENOL) suppository 650 mg (has no administration in time range)  HYDROcodone-acetaminophen (NORCO/VICODIN) 5-325 MG per tablet 1-2 tablet (has no administration in time range)  morphine (PF) 2 MG/ML injection 2 mg (has no administration in time range)  docusate sodium (COLACE) capsule 100 mg (has no administration in time range)  polyethylene glycol (MIRALAX / GLYCOLAX) packet 17 g (has no administration in time range)  bisacodyl (DULCOLAX) EC tablet 5 mg (has no administration in time range)  ondansetron (ZOFRAN) tablet 4 mg (has no administration in time range)    Or  ondansetron (ZOFRAN) injection 4 mg (has no administration in time range)  guaiFENesin (MUCINEX) 12 hr tablet 600 mg (has no administration in time range)  nicotine (NICODERM CQ - dosed in mg/24 hours) patch 14 mg (has no administration in time range)  hydrALAZINE (APRESOLINE) injection 5 mg (has no administration in time range)  sodium chloride flush (NS) 0.9 % injection  3-10 mL (has no administration in time range)  sodium chloride flush (NS) 0.9 % injection 3-10 mL (has no administration in time range)  doxycycline (VIBRA-TABS) tablet 100 mg (has no administration in time range)  ipratropium-albuterol (DUONEB) 0.5-2.5 (3) MG/3ML nebulizer solution 3 mL (3 mLs Nebulization Given 05/18/23 0725)  albuterol (PROVENTIL) (2.5 MG/3ML) 0.083% nebulizer solution (10 mg/hr Nebulization Given 05/18/23 0725)     Review of Systems  Respiratory:  Positive for cough and shortness of breath.    Past Medical History:  Diagnosis Date   AF  (atrial fibrillation) (HCC)    Alcohol abuse    Angina pectoris    CAD (coronary artery disease) 12/15/2004   COPD (chronic obstructive pulmonary disease) (HCC)    Coronary artery disease    Heavy cigarette smoker    HTN (hypertension)    Idiopathic cardiomyopathy (HCC) 07/16/2009   Non compliance with medical treatment    Seizures [facility])    Past Surgical History:  Procedure Laterality Date   CARDIAC CATHETERIZATION  2006   CARDIAC CATHETERIZATION  12/2004   Left    CARDIOVERSION  01/21/2011   Procedure: CARDIOVERSION;  Surgeon: Darden Palmer., MD;  Location: [facility] OR;  Service: Cardiovascular;  Laterality: N/A;   DOPPLER ECHOCARDIOGRAPHY  02/2011   ELECTROPHYSIOLOGY STUDY N/A 12/16/2011   Procedure: ELECTROPHYSIOLOGY STUDY;  Surgeon: Marinus Maw, MD;  Location: [facility] CATH LAB;  Service: Cardiovascular;  Laterality: N/A;   EP IMPLANTABLE DEVICE N/A 08/24/2015   Procedure: Loop Recorder Removal;  Surgeon: Marinus Maw, MD;  Location: [facility] INVASIVE CV LAB;  Service: Cardiovascular;  Laterality: N/A;   fx collar bone     NO PAST SURGERIES      reports that he has been smoking cigarettes. He has a 58 pack-year smoking history. He has never used smokeless tobacco. He reports that he does not currently use alcohol. He reports current drug use. Drug: Marijuana. No Known Allergies Family History  Problem Relation Age of Onset   Suicidality Sister    CVA Mother    Stroke Mother    Heart disease Father    Coronary artery disease Father    COPD Father    Prior to Admission medications   Medication Sig Start Date End Date Taking? Authorizing Provider  albuterol (PROVENTIL) (2.5 MG/3ML) 0.083% nebulizer solution Take 3 mLs (2.5 mg total) by nebulization every 4 (four) hours as needed for wheezing or shortness of breath. 05/16/23  Yes Ghimire, Werner Lean, MD  aspirin EC 81 MG tablet Take 1 tablet (81 mg total) by mouth daily. Swallow whole. 12/08/22  Yes Ghimire, Werner Lean, MD  ELIQUIS 5 MG TABS  tablet Take 1 tablet (5 mg total) by mouth 2 (two) times daily. 05/16/23  Yes Ghimire, Werner Lean, MD  fluticasone furoate-vilanterol (BREO ELLIPTA) 100-25 MCG/ACT AEPB Inhale 1 puff into the lungs daily. 05/16/23  Yes Ghimire, Werner Lean, MD  folic acid (FOLVITE) 1 MG tablet Take 1 tablet (1 mg total) by mouth daily. 05/16/23  Yes Ghimire, Werner Lean, MD  gabapentin (NEURONTIN) 400 MG capsule Take 1 capsule (400 mg total) by mouth daily as needed (for nerve pain). 05/16/23  Yes Ghimire, Werner Lean, MD  metoprolol succinate (TOPROL-XL) 50 MG 24 hr tablet Take 1 tablet (50 mg total) by mouth daily. Take with or immediately following a meal. 05/16/23  Yes Ghimire, Werner Lean, MD  polyethylene glycol (MIRALAX / GLYCOLAX) 17 g packet Take 17 g by mouth daily. Patient taking differently: Take  17 g by mouth daily as needed for mild constipation. 03/15/19  Yes Dellia Cloud, MD  senna (SENOKOT) 8.6 MG TABS tablet Take 1-2 tablets (8.6-17.2 mg total) by mouth daily. Patient taking differently: Take 1-2 tablets by mouth daily as needed for moderate constipation. 03/15/19  Yes Dellia Cloud, MD  SPIRIVA RESPIMAT 2.5 MCG/ACT AERS Inhale 2 puffs into the lungs daily. 05/16/23  Yes Ghimire, Werner Lean, MD  traZODone (DESYREL) 50 MG tablet Take 1 tablet (50 mg total) by mouth at bedtime as needed for sleep. Patient taking differently: Take 50 mg by mouth at bedtime. 05/16/23  Yes Ghimire, Werner Lean, MD  VENTOLIN HFA 108 (90 Base) MCG/ACT inhaler Inhale 1-2 puffs into the lungs every 6 (six) hours as needed for wheezing or shortness of breath 05/16/23  Yes Ghimire, Werner Lean, MD  linaclotide [facility]) 145 MCG CAPS capsule Take 1 capsule (145 mcg total) by mouth daily before breakfast. Patient not taking: Reported on 05/18/2023 05/16/23   Maretta Bees, MD  OXYGEN Inhale 3 L into the lungs continuous.     [provider]  pantoprazole (PROTONIX) 40 MG tablet Take 1 tablet (40 mg total) by mouth daily as needed (acid  reflux). Patient not taking: Reported on 05/18/2023 05/16/23   Maretta Bees, MD                                                                                 Vitals:   05/18/23 1200 05/18/23 1230 05/18/23 1300 05/18/23 1330  BP: 105/67 106/68 106/76 112/78  Pulse: (!) 103 90 69 91  Resp: (!) 24 19 (!) 30 (!) 32  Temp:      TempSrc:      SpO2: 100% 98% 94% 99%  Weight:      Height:       Physical Exam Vitals and nursing note reviewed.  Constitutional:      General: He is in acute distress.     Appearance: He is ill-appearing.  HENT:     Head: Normocephalic and atraumatic.     Right Ear: Hearing normal.     Left Ear: Hearing normal.     Nose: Nose normal. No nasal deformity.     Mouth/Throat:     Lips: Pink.     Tongue: No lesions.  Eyes:     General: Lids are normal.     Extraocular Movements: Extraocular movements intact.  Cardiovascular:     Rate and Rhythm: Normal rate. Rhythm irregular.     Heart sounds: Normal heart sounds.  Pulmonary:     Effort: Respiratory distress present.     Breath sounds: Wheezing present.  Abdominal:     General: Bowel sounds are normal. There is no distension.     Palpations: Abdomen is soft. There is no mass.     Tenderness: There is no abdominal tenderness.  Musculoskeletal:     Right lower leg: No edema.     Left lower leg: No edema.  Skin:    General: Skin is warm.  Neurological:     General: No focal deficit present.     Mental Status: He is alert and oriented to person, place, and  time.     Cranial Nerves: Cranial nerves 2-12 are intact.  Psychiatric:        Attention and Perception: Attention normal.        Mood and Affect: Mood normal.        Speech: Speech normal.        Behavior: Behavior normal. Behavior is cooperative.     Labs on Admission: I have personally reviewed following labs and imaging studies CBC: Recent Labs  Lab 05/12/23 0419 05/13/23 0235 05/15/23 1058 05/18/23 0652 05/18/23 1311  WBC 16.1*  13.6* 10.7* 11.8*  --   NEUTROABS  --   --   --  9.6*  --   HGB 12.6* 12.6* 13.6 13.7 15.3  HCT 41.3 42.1 44.9 44.4 45.0  MCV 102.2* 102.2* 102.0* 99.6  --   PLT 221 225 236 259  --    Basic Metabolic Panel: Recent Labs  Lab 05/12/23 0419 05/13/23 0235 05/15/23 1058 05/16/23 0533 05/18/23 0652 05/18/23 1311  NA 139 138 137 139 138 134*  K 4.8 4.6 4.4 4.4 4.6 4.7  CL 89* 83* 91* 96* 94*  --   CO2 45* >45* 36* 35* 38*  --   GLUCOSE 154* 154* 182* 114* 122*  --   BUN 25* 24* 31* 31* 22  --   CREATININE 0.70 0.82 1.03 0.87 0.94  --   CALCIUM 9.0 9.1 9.5 9.4 9.1  --   MG 1.9 1.9  --   --   --   --    GFR: Estimated Creatinine Clearance: 74 mL/min (by C-G formula based on SCr of 0.94 mg/dL). Liver Function Tests: Recent Labs  Lab 05/18/23 0652  AST 22  ALT 47*  ALKPHOS 49  BILITOT 1.3*  PROT 6.4*  ALBUMIN 3.1*   No results for input(s): "LIPASE", "AMYLASE" in the last 168 hours. No results for input(s): "AMMONIA" in the last 168 hours. Coagulation Profile: No results for input(s): "INR", "PROTIME" in the last 168 hours. Cardiac Enzymes: No results for input(s): "CKTOTAL", "CKMB", "CKMBINDEX", "TROPONINI" in the last 168 hours. BNP (last 3 results) No results for input(s): "PROBNP" in the last 8760 hours. HbA1C: No results for input(s): "HGBA1C" in the last 72 hours. CBG: No results for input(s): "GLUCAP" in the last 168 hours. Lipid Profile: No results for input(s): "CHOL", "HDL", "LDLCALC", "TRIG", "CHOLHDL", "LDLDIRECT" in the last 72 hours. Thyroid Function Tests: No results for input(s): "TSH", "T4TOTAL", "FREET4", "T3FREE", "THYROIDAB" in the last 72 hours. Anemia Panel: No results for input(s): "VITAMINB12", "FOLATE", "FERRITIN", "TIBC", "IRON", "RETICCTPCT" in the last 72 hours. Urine analysis:    Component Value Date/Time   COLORURINE AMBER (A) 05/06/2023 1815   APPEARANCEUR HAZY (A) 05/06/2023 1815   LABSPEC 1.017 05/06/2023 1815   PHURINE 5.0  05/06/2023 1815   GLUCOSEU 50 (A) 05/06/2023 1815   HGBUR SMALL (A) 05/06/2023 1815   BILIRUBINUR NEGATIVE 05/06/2023 1815   KETONESUR 5 (A) 05/06/2023 1815   PROTEINUR 100 (A) 05/06/2023 1815   UROBILINOGEN 0.2 12/08/2012 1516   NITRITE NEGATIVE 05/06/2023 1815   LEUKOCYTESUR NEGATIVE 05/06/2023 1815   Radiological Exams on Admission: DG Chest Portable 1 View Result Date: 05/18/2023 CLINICAL DATA:  73 year old male with shortness of breath, respiratory distress. EXAM: PORTABLE CHEST 1 VIEW COMPARISON:  Portable chest 05/12/2023.  CTA chest 05/13/2023. FINDINGS: Portable AP views at 0700 hours. Hyperinflated lungs. Centrilobular emphysema and small layering left pleural effusion on the recent CTA. Lung volumes and ventilation stable since 05/12/2023. No pneumothorax,  pulmonary edema, new lung opacity. Normal cardiac size and mediastinal contours. Visualized tracheal air column is within normal limits. Stable visualized osseous structures. Negative visible bowel gas. IMPRESSION: Emphysema (ICD10-J43.9) and small left pleural effusion stable from recent CTA. No new cardiopulmonary abnormality. Electronically Signed   By: Odessa Fleming M.D.   On: 05/18/2023 07:08   Data Reviewed: Relevant notes from primary care and specialist visits, past discharge summaries as available in EHR, including Care Everywhere. Prior diagnostic testing as pertinent to current admission diagnoses, Updated medications and problem lists for reconciliation ED course, including vitals, labs, imaging, treatment and response to treatment,Triage notes, nursing and pharmacy notes and ED provider's notes Notable results as noted in HPI.Discussed case with EDMD/ ED APP/ or Specialty MD on call and as needed. Assessment & Plan Acute on chronic respiratory failure with hypoxia and hypercapnia (HCC) - ABG shows normal pH hypercapnia 60.9 and hypoxia of 80-BiPAP ordered to being managed by RT. - Continue with as needed albuterol and DuoNebs  incentive spirometer. D/D in his case include PNA/ COPD/ CHF , thus work up will include continuing COPD treatment, Hypercapnia treatment.  We will also repeat echo and request consult if needed pt had pericardial effusion in march echo, we will cont with breathing t/t with bronchodilator and nebulizer.  CAD (coronary artery disease) No c/o chest pain or pressure. He reported chest tightness with cough Paroxysmal atrial fibrillation (HCC) Cont eliquis.  EKG today shows A-fib at 93 with PVCs, QTc of 439. Essential hypertension Vitals:   05/18/23 0800 05/18/23 0830 05/18/23 0900 05/18/23 0930  BP: 130/77 120/72 (!) 106/55 123/71   05/18/23 1000 05/18/23 1030 05/18/23 1100 05/18/23 1130  BP: 107/77 118/68 109/75 109/75   05/18/23 1200 05/18/23 1230 05/18/23 1300 05/18/23 1330  BP: 105/67 106/68 106/76 112/78  Home regimen includes metoprolol 50 mg.  Blood pressures are low.  Will discuss with patient to limit Linzess on a as needed basis to avoid dizziness and any falls 2/2 any orthostatic changes or blood pressure fluctuations.  Cigarette smoker Nicotine patch.  Pulmonary embolism (HCC) Cont eliquis.  Malnutrition of moderate degree Nutrition consult.  Chronic systolic heart failure (HCC) 2 d echo ef 40-45% PRN lasix.  Respiratory distress Suspect 2/2 copd / ? CHF . Strict I/O.   DVT prophylaxis:  Eliquis Consults:  None Advance Care Planning:    Code Status: Full Code   Family Communication:  None Disposition Plan:  Home Severity of Illness: The appropriate patient status for this patient is OBSERVATION. Observation status is judged to be reasonable and necessary in order to provide the required intensity of service to ensure the patient's safety. The patient's presenting symptoms, physical exam findings, and initial radiographic and laboratory data in the context of their medical condition is felt to place them at decreased risk for further clinical deterioration.  Furthermore, it is anticipated that the patient will be medically stable for discharge from the hospital within 2 midnights of admission.   Unresulted Labs (From admission, onward)     Start     Ordered   05/19/23 0500  Comprehensive metabolic panel  Tomorrow morning,   R        05/18/23 1446   05/19/23 0500  CBC  Tomorrow morning,   R        05/18/23 1446   05/18/23 1446  Magnesium  Add-on,   AD        05/18/23 1446   05/18/23 1444  CBC  (heparin)  Once,  R       Comments: Baseline for heparin therapy IF NOT ALREADY DRAWN.  Notify MD if PLT < 100 K.    05/18/23 1446   05/18/23 1444  Creatinine, serum  (heparin)  Once,   R       Comments: Baseline for heparin therapy IF NOT ALREADY DRAWN.    05/18/23 1446   05/18/23 1443  Respiratory (~20 pathogens) panel by PCR  (COPD / Pneumonia / Cellulitis / Lower Extremity Wound (Diabetic Foot Infection))  Once,   R        05/18/23 1446   05/18/23 1234  Ethanol  Add-on,   AD        05/18/23 1233            Orders Placed This Encounter  Procedures   Resp panel by RT-PCR (RSV, Flu A&B, Covid) Anterior Nasal Swab   Respiratory (~20 pathogens) panel by PCR   DG Chest Portable 1 View   CBC with Differential   Comprehensive metabolic panel   Brain natriuretic peptide   Ethanol   CBC   Creatinine, serum   Comprehensive metabolic panel   CBC   Magnesium   Diet Heart Room service appropriate? Yes; Fluid consistency: Thin   Refer to Sidebar Report:   Apply Chronic Obstructive Pulmonary Disease Care Plan   Provide Smoking / Tobacco cessation education.  Provide education material and information on community counseling.   Cardiac Monitoring Continuous x 24 hours Indications for use: Other; other indications for use: COPD exacerbation   Maintain IV access   Vital signs   Notify physician (specify)   Mobility Protocol: No Restrictions RN to initiate protocols based on patient's level of care   Refer to Sidebar Report Refer to ICU,  Med-Surg, Progressive, and Step-Down Mobility Protocol Sidebars   Initiate Adult Central Line Maintenance and Catheter Protocol for patients with central line (CVC, PICC, Port, Hemodialysis, Trialysis)   If patient diabetic or glucose greater than 140 notify physician for Sliding Scale Insulin Orders   Intake and Output   Do not place and if present remove PureWick   Initiate Oral Care Protocol   Initiate Carrier Fluid Protocol   Nurse to provide smoking / tobacco cessation education   RN may order General Admission PRN Orders utilizing "General Admission PRN medications" (through manage orders) for the following patient needs: allergy symptoms (Claritin), cold sores (Carmex), cough (Robitussin DM), eye irritation (Liquifilm Tears), hemorrhoids (Tucks), indigestion (Maalox), minor skin irritation (Hydrocortisone Cream), muscle pain Romeo Apple Gay), nose irritation (saline nasal spray) and sore throat (Chloraseptic spray).   Full code   Consult to hospitalist   Nutritional services consult   Consult to Transition of Care Team   Consult to respiratory care treatment   Pharmacy consult   Droplet precaution   OT eval and treat   PT eval and treat   Bipap   Pulse oximetry check with vital signs   Oxygen therapy Mode or (Route): Nasal cannula; Liters Per Minute: 2; Keep O2 saturation between: greater than 92 %   I-Stat arterial blood gas, ED ([facility] ED, [facility], [facility])   ED EKG   EKG 12-Lead   ECHOCARDIOGRAM COMPLETE BUBBLE STUDY   Place in observation (patient's expected length of stay will be less than 2 midnights)   Aspiration precautions   Fall precautions    Author: Gertha Calkin, MD 12 pm -8 pm. 05/18/2023 2:47 PM >>Please note for after hour concern,or critical results past 8pm please contact the  [facility] hospitalist [facility] floor coverage provider from 7 PM- 7 AM. For on call review [email or web address], username [email or web address] and PW: your phone number<<

## 2023-05-18 NOTE — Assessment & Plan Note (Signed)
 Cont eliquis.  EKG today shows A-fib at 93 with PVCs, QTc of 439.

## 2023-05-18 NOTE — Assessment & Plan Note (Signed)
-   ABG shows normal pH hypercapnia 60.9 and hypoxia of 80-BiPAP ordered to being managed by RT. - Continue with as needed albuterol and DuoNebs incentive spirometer. D/D in his case include PNA/ COPD/ CHF , thus work up will include continuing COPD treatment, Hypercapnia treatment.  We will also repeat echo and request consult if needed pt had pericardial effusion in march echo, we will cont with breathing t/t with bronchodilator and nebulizer.

## 2023-05-18 NOTE — Assessment & Plan Note (Signed)
 Suspect 2/2 copd / ? CHF . Strict I/O.

## 2023-05-18 NOTE — Assessment & Plan Note (Signed)
 2 d echo ef 40-45% PRN lasix.

## 2023-05-18 NOTE — Assessment & Plan Note (Signed)
 Nicotine patch

## 2023-05-19 ENCOUNTER — Observation Stay (HOSPITAL_COMMUNITY)

## 2023-05-19 ENCOUNTER — Encounter (HOSPITAL_COMMUNITY): Payer: Self-pay | Admitting: Internal Medicine

## 2023-05-19 DIAGNOSIS — J441 Chronic obstructive pulmonary disease with (acute) exacerbation: Secondary | ICD-10-CM

## 2023-05-19 DIAGNOSIS — I5022 Chronic systolic (congestive) heart failure: Secondary | ICD-10-CM | POA: Diagnosis present

## 2023-05-19 DIAGNOSIS — J9622 Acute and chronic respiratory failure with hypercapnia: Secondary | ICD-10-CM

## 2023-05-19 DIAGNOSIS — I482 Chronic atrial fibrillation, unspecified: Secondary | ICD-10-CM | POA: Diagnosis present

## 2023-05-19 DIAGNOSIS — R5381 Other malaise: Secondary | ICD-10-CM | POA: Diagnosis present

## 2023-05-19 DIAGNOSIS — J9621 Acute and chronic respiratory failure with hypoxia: Secondary | ICD-10-CM

## 2023-05-19 DIAGNOSIS — Z7901 Long term (current) use of anticoagulants: Secondary | ICD-10-CM | POA: Diagnosis not present

## 2023-05-19 DIAGNOSIS — D72828 Other elevated white blood cell count: Secondary | ICD-10-CM | POA: Diagnosis present

## 2023-05-19 DIAGNOSIS — F17210 Nicotine dependence, cigarettes, uncomplicated: Secondary | ICD-10-CM | POA: Diagnosis present

## 2023-05-19 DIAGNOSIS — Z86711 Personal history of pulmonary embolism: Secondary | ICD-10-CM | POA: Diagnosis not present

## 2023-05-19 DIAGNOSIS — I2510 Atherosclerotic heart disease of native coronary artery without angina pectoris: Secondary | ICD-10-CM | POA: Diagnosis present

## 2023-05-19 DIAGNOSIS — R0602 Shortness of breath: Secondary | ICD-10-CM | POA: Diagnosis present

## 2023-05-19 DIAGNOSIS — I493 Ventricular premature depolarization: Secondary | ICD-10-CM | POA: Diagnosis present

## 2023-05-19 DIAGNOSIS — T380X5A Adverse effect of glucocorticoids and synthetic analogues, initial encounter: Secondary | ICD-10-CM | POA: Diagnosis present

## 2023-05-19 DIAGNOSIS — Z8701 Personal history of pneumonia (recurrent): Secondary | ICD-10-CM | POA: Diagnosis not present

## 2023-05-19 DIAGNOSIS — E875 Hyperkalemia: Secondary | ICD-10-CM | POA: Diagnosis present

## 2023-05-19 DIAGNOSIS — G894 Chronic pain syndrome: Secondary | ICD-10-CM | POA: Diagnosis present

## 2023-05-19 DIAGNOSIS — J439 Emphysema, unspecified: Secondary | ICD-10-CM | POA: Diagnosis present

## 2023-05-19 DIAGNOSIS — I480 Paroxysmal atrial fibrillation: Secondary | ICD-10-CM | POA: Diagnosis present

## 2023-05-19 DIAGNOSIS — Z6822 Body mass index (BMI) 22.0-22.9, adult: Secondary | ICD-10-CM | POA: Diagnosis not present

## 2023-05-19 DIAGNOSIS — I110 Hypertensive heart disease with heart failure: Secondary | ICD-10-CM | POA: Diagnosis present

## 2023-05-19 DIAGNOSIS — E440 Moderate protein-calorie malnutrition: Secondary | ICD-10-CM | POA: Diagnosis present

## 2023-05-19 DIAGNOSIS — I429 Cardiomyopathy, unspecified: Secondary | ICD-10-CM | POA: Diagnosis present

## 2023-05-19 DIAGNOSIS — Z1152 Encounter for screening for COVID-19: Secondary | ICD-10-CM | POA: Diagnosis not present

## 2023-05-19 DIAGNOSIS — Z9981 Dependence on supplemental oxygen: Secondary | ICD-10-CM | POA: Diagnosis not present

## 2023-05-19 LAB — CBC
HCT: 41.1 % (ref 39.0–52.0)
Hemoglobin: 12.9 g/dL — ABNORMAL LOW (ref 13.0–17.0)
MCH: 30.8 pg (ref 26.0–34.0)
MCHC: 31.4 g/dL (ref 30.0–36.0)
MCV: 98.1 fL (ref 80.0–100.0)
Platelets: 241 10*3/uL (ref 150–400)
RBC: 4.19 MIL/uL — ABNORMAL LOW (ref 4.22–5.81)
RDW: 13.9 % (ref 11.5–15.5)
WBC: 10.7 10*3/uL — ABNORMAL HIGH (ref 4.0–10.5)
nRBC: 0 % (ref 0.0–0.2)

## 2023-05-19 LAB — ECHOCARDIOGRAM LIMITED BUBBLE STUDY
Calc EF: 39 %
S' Lateral: 4.2 cm
Single Plane A2C EF: 36.7 %
Single Plane A4C EF: 40.5 %

## 2023-05-19 LAB — COMPREHENSIVE METABOLIC PANEL WITH GFR
ALT: 45 U/L — ABNORMAL HIGH (ref 0–44)
AST: 20 U/L (ref 15–41)
Albumin: 2.9 g/dL — ABNORMAL LOW (ref 3.5–5.0)
Alkaline Phosphatase: 45 U/L (ref 38–126)
Anion gap: 10 (ref 5–15)
BUN: 29 mg/dL — ABNORMAL HIGH (ref 8–23)
CO2: 36 mmol/L — ABNORMAL HIGH (ref 22–32)
Calcium: 8.7 mg/dL — ABNORMAL LOW (ref 8.9–10.3)
Chloride: 92 mmol/L — ABNORMAL LOW (ref 98–111)
Creatinine, Ser: 1 mg/dL (ref 0.61–1.24)
GFR, Estimated: >60 mL/min (ref >60)
Glucose, Bld: 234 mg/dL — ABNORMAL HIGH (ref 70–99)
Potassium: 5.1 mmol/L (ref 3.5–5.1)
Sodium: 138 mmol/L (ref 135–145)
Total Bilirubin: 0.7 mg/dL (ref 0.0–1.2)
Total Protein: 5.9 g/dL — ABNORMAL LOW (ref 6.5–8.1)

## 2023-05-19 LAB — BRAIN NATRIURETIC PEPTIDE: B Natriuretic Peptide: 60.8 pg/mL (ref 0.0–100.0)

## 2023-05-19 MED ORDER — ADULT MULTIVITAMIN W/MINERALS CH
1.0000 | ORAL_TABLET | Freq: Every day | ORAL | Status: DC
Start: 2023-05-19 — End: 2023-05-21
  Administered 2023-05-19 – 2023-05-20 (×2): 1 via ORAL
  Filled 2023-05-19 (×2): qty 1

## 2023-05-19 MED ORDER — IPRATROPIUM-ALBUTEROL 0.5-2.5 (3) MG/3ML IN SOLN
3.0000 mL | Freq: Two times a day (BID) | RESPIRATORY_TRACT | Status: DC
Start: 2023-05-19 — End: 2023-05-21
  Administered 2023-05-19 – 2023-05-21 (×4): 3 mL via RESPIRATORY_TRACT
  Filled 2023-05-19 (×4): qty 3

## 2023-05-19 MED ORDER — PERFLUTREN LIPID MICROSPHERE
1.0000 mL | INTRAVENOUS | Status: AC | PRN
  Administered 2023-05-19: 2 mL via INTRAVENOUS

## 2023-05-19 MED ORDER — METHYLPREDNISOLONE SODIUM SUCC 125 MG IJ SOLR
125.0000 mg | Freq: Every day | INTRAMUSCULAR | Status: DC
  Administered 2023-05-19 – 2023-05-20 (×2): 125 mg via INTRAVENOUS
  Filled 2023-05-19 (×2): qty 2

## 2023-05-19 MED ORDER — APIXABAN 5 MG PO TABS
5.0000 mg | ORAL_TABLET | Freq: Two times a day (BID) | ORAL | Status: DC
  Administered 2023-05-19 – 2023-05-21 (×5): 5 mg via ORAL
  Filled 2023-05-19 (×5): qty 1

## 2023-05-19 MED ORDER — FOLIC ACID 1 MG PO TABS
1.0000 mg | ORAL_TABLET | Freq: Every day | ORAL | Status: DC
  Administered 2023-05-19 – 2023-05-21 (×3): 1 mg via ORAL
  Filled 2023-05-19 (×3): qty 1

## 2023-05-19 MED ORDER — SALINE SPRAY 0.65 % NA SOLN
1.0000 | NASAL | Status: DC | PRN
  Administered 2023-05-20: 1 via NASAL
  Filled 2023-05-19 (×2): qty 44

## 2023-05-19 NOTE — Progress Notes (Addendum)
   05/19/23 1536  Mobility  Activity Ambulated with assistance in hallway  Level of Assistance Standby assist, set-up cues, supervision of patient - no hands on  Assistive Device Front wheel walker  Distance Ambulated (ft) 50 ft  Activity Response Tolerated fair  Mobility Referral Yes  Mobility visit 1 Mobility  Mobility Specialist Start Time (ACUTE ONLY) 1522  Mobility Specialist Stop Time (ACUTE ONLY) 1536  Mobility Specialist Time Calculation (min) (ACUTE ONLY) 14 min   Mobility Specialist: Progress Note  During Mobility: HR SpO2 90% 10L  Pt agreeable to mobility session - received in chair. Pt was asymptomatic throughout session with no complaints. Further mobility deferred d/t BR urgency  Returned to [facility] with all needs met - instructed to pull call string when finished - RN and NT notified via Secure chat.   Barnie Mort, BS Mobility Specialist Please contact via SecureChat or  Rehab office at [phone].

## 2023-05-19 NOTE — Progress Notes (Addendum)
   05/19/23 1217  Mobility  Activity Transferred from bed to chair  Level of Assistance Standby assist, set-up cues, supervision of patient - no hands on  Assistive Device Front wheel walker  Activity Response Tolerated fair  Mobility Referral Yes  Mobility visit 1 Mobility  Mobility Specialist Start Time (ACUTE ONLY) 1205  Mobility Specialist Stop Time (ACUTE ONLY) 1217  Mobility Specialist Time Calculation (min) (ACUTE ONLY) 12 min   Mobility Specialist: Progress Note  Pre-Mobility:      HR 93, SpO2 85% 8L Post-Mobility:    HR 96, SpO2 99% 8L  Pt requesting  transfer - agreeable to mobility session - received in bed. Pt with desat and appeared very upset stating "they don't tell me nothing." MS was able to help pt calm down by listening to pt and handling needs.  Returned to chair with all needs met - call bell within reach.   Barnie Mort, BS Mobility Specialist Please contact via SecureChat or  Rehab office at [phone].

## 2023-05-19 NOTE — Progress Notes (Signed)
 Initial Nutrition Assessment  DOCUMENTATION CODES:   Non-severe (moderate) malnutrition in context of chronic illness  INTERVENTION:  Add MVI w/ minerals  Continue thiamine daily; add folic acid  Add double portion proteins Snacks TID between meals for added calories and protein Magic cup TID with meals, each supplement provides 290 kcal and 9 grams of protein   NUTRITION DIAGNOSIS:  Moderate Malnutrition related to chronic illness as evidenced by severe muscle depletion, moderate fat depletion, moderate muscle depletion, percent weight loss.  GOAL:  Patient will meet greater than or equal to 90% of their needs  MONITOR:  PO intake, Labs, Weight trends, TF tolerance, Skin  REASON FOR ASSESSMENT:  Consult Assessment of nutrition requirement/status  ASSESSMENT:   Pt recently discharged from [facility] after admission from 3/26-4/5 for respiratory distress w/ acute on chronic respiratory failure w/ COPD exacerbation. Returned with complaints of SOB. PMH: EtOH abuse, COPD, HFrEF, HTN, idiopathic cardiomyopathy, A-fib, CAD, tobacco use.  Previous Admission 3/26 Admitted, Intubated. US abdomen: Trace free fluid around the liver edge. No evidence of cholelithiasis or acute cholecystitis. Renal US wit normal kidneys  3/27 TF initiated  3/28 extubated/OGT removed 3/29 txr out of ICU 4/5 discharged home  Current Admission 4/7 re-admitted 4/7 CXR: small pleural effusion and emphysema  Pt recently discharged (4/5). This writer was following prior to discharge and re-established care today. Will re-initiate previous plan of care as patient was tolerating and accepting of interventions at that time.  Average Meal Intake 4/7: 100% x1 documented meal 4/8: 100% x1 documented meal  He endorses no significant changes to appetite/intake since discharge. Documented intake desirable. Open to, again, receiving snacks while here.   24 Hour Recall B: grits, eggs over light, sausage, juice or coffee  w/ milk L: steak sandwich w/ LTO, cream potatoes, wine, and beer D: beef stew or [place] sandwich w/ LTO w/ wine  Admit/Current Weight: 72.6kg - ? Accuracy; shows 7% wt loss in two days; recommend recollect to assess trend  Reported UBW as around 146lbs, however questionable at that time. Current weight 159lbs, however also questionable AEB trend noted above. Per chart review, he has shown 16% wt loss in six months. This is considered clinically significant for the time frame. Continues on prednisone with some mild edema noted to BLEs.   Intake/Output Summary (Last 24 hours) at 05/19/2023 1412 Last data filed at 05/19/2023 0835 Gross per 24 hour  Intake 483 ml  Output 400 ml  Net 83 ml    Skin intact. Bowels stable. Moved bowels today. Recommend folic acid daily 2/2 EtOH use.   Meds: docusate sodium, doxycycline, prednisone, pantoprazole, thiamine  Labs: Na+ 138 (wdl) K+ 5.1 (wdl) Mg 1.5 (L) - repleted WBC 11.8>10.7 (H) CBGs 122-234 x24 hours A1c 6.1 93/2025)  NUTRITION - FOCUSED PHYSICAL EXAM:  Flowsheet Row Most Recent Value  Orbital Region Moderate depletion  Upper Arm Region Moderate depletion  Thoracic and Lumbar Region Moderate depletion  Buccal Region Moderate depletion  Temple Region Moderate depletion  Clavicle Bone Region Moderate depletion  Clavicle and Acromion Bone Region Moderate depletion  Scapular Bone Region Moderate depletion  Dorsal Hand Moderate depletion  Patellar Region Severe depletion  Anterior Thigh Region Severe depletion  Posterior Calf Region Severe depletion  Edema (RD Assessment) Mild  Hair Reviewed  Eyes Reviewed  Mouth Reviewed  Skin Reviewed  Nails Reviewed   Diet Order:   Diet Order             Diet Heart Room service  appropriate? Yes; Fluid consistency: Thin  Diet effective now             EDUCATION NEEDS:   Education needs have been addressed  Skin:  Skin Assessment: Reviewed RN Assessment  Last BM:  4/8 - type 5  x1  Height:  Ht Readings from Last 1 Encounters:  05/18/23 5\' 11"  (1.803 m)   Weight:  Wt Readings from Last 1 Encounters:  05/18/23 72.6 kg    Ideal Body Weight:  78.2 kg  BMI:  Body mass index is 22.32 kg/m.  Estimated Nutritional Needs:   Kcal:  1800-2000kcal  Protein:  95-110g  Fluid:  >1.8L/day  Myrtie Cruise MS, RD, LDN Registered Dietitian Clinical Nutrition RD Inpatient Contact Info in Amion

## 2023-05-19 NOTE — Progress Notes (Signed)
 2D echo attempted, patient in chair getting breathing treatment. Will try later

## 2023-05-19 NOTE — Progress Notes (Signed)
 PROGRESS NOTE    Johnny Rodriguez  WUJ:[id number] DOB: 05/04/1950 DOA: 05/18/2023 PCP: Rema Fendt, NP    Chief Complaint  Patient presents with   Shortness of Breath    Brief Narrative:  Johnny Rodriguez is a 73 y.o. male with past medical history  of  COPD on 3 L of oxygen at home, HFrEF, PAF on eliqus , HTN- recently admitted on 3/26 for respiratory distress- h.flu pna/ intubated and in icu and extubated on 3/28 and transferred to [facility] on 3/30, patient was discharged on 05/16/2023, on 3 L oxygen, EMS were called as he was dyspneic, he was requiring 10 L oxygen and [facility] hospitalist consulted to admit, workup significant for CO2 retention as well.     Assessment & Plan:   Principal Problem:   Respiratory distress Active Problems:   CAD (coronary artery disease)   Paroxysmal atrial fibrillation (HCC)   Chronic systolic heart failure (HCC)   Essential hypertension   Acute on chronic respiratory failure with hypoxia and hypercapnia (HCC)   Cigarette smoker   Pulmonary embolism (HCC)   Malnutrition of moderate degree   Chronic respiratory failure with hypoxia and hypercapnia COPD exacerbation - ABG shows normal pH hypercapnia 60.9 and hypoxia of 80 -Requiring BiPAP initially, but this morning he is on 10 L oxygen. - Diminished air entry bilaterally, concern for COPD exacerbation, -Continue with IV steroids, scheduled DuoNebs, as needed albuterol -Continue with p.o. doxycycline   Chronic atrial fibrillation Rate controlled Continue Eliquis   chronic HFrEF Recent echo with EF 40 to 45% Euvolemic, as needed to keep on the dry side   Known history of PE Continue with Eliquis for anticoagulation  Chronic pain syndrome Neurontin   Debility/deconditioning PT OT consulted   History of tobacco use he was counseled again, but reported he only smoked 1 cigarette since he was discharged   Moderate PCM -Nutritionist consulted Nutrition Problem: Moderate  Malnutrition Etiology: chronic illness Signs/Symptoms: severe muscle depletion, moderate muscle depletion, moderate fat depletion Interventions: Refer to RD note for recommendations    DVT prophylaxis: Eliquis Code Status: Full code Family Communication: None at bedside Disposition:   Status is: Inpatient    Consultants:  None  Subjective: Reports dyspnea, cough had mild productive sputum  Objective: Vitals:   05/19/23 0400 05/19/23 0800 05/19/23 1120 05/19/23 1352  BP: 115/81 112/66  (!) 105/59  Pulse: 90 83  (!) 109  Resp: 15 20  20   Temp: 98.2 F (36.8 C) (!) 97.4 F (36.3 C)  (!) 97.4 F (36.3 C)  TempSrc: Oral Oral  Oral  SpO2: 95% 96% 97% 98%  Weight:      Height:        Intake/Output Summary (Last 24 hours) at 05/19/2023 1446 Last data filed at 05/19/2023 0835 Gross per 24 hour  Intake 483 ml  Output 400 ml  Net 83 ml   Filed Weights   05/18/23 0648  Weight: 72.6 kg    Examination:  Awake Alert, Oriented X 3, No new F.N deficits, Normal affect Symmetrical Chest wall movement, diminished  air entry bilaterally with wheezing RRR,No Gallops,Rubs or new Murmurs, No Parasternal Heave +ve B.Sounds, Abd Soft, No tenderness, No rebound - guarding or rigidity. No Cyanosis, Clubbing or edema, No new Rash or bruise       Data Reviewed: I have personally reviewed following labs and imaging studies  CBC: Recent Labs  Lab 05/13/23 0235 05/15/23 1058 05/18/23 0652 05/18/23 1311 05/19/23 0437  WBC 13.6*  10.7* 11.8*  --  10.7*  NEUTROABS  --   --  9.6*  --   --   HGB 12.6* 13.6 13.7 15.3 12.9*  HCT 42.1 44.9 44.4 45.0 41.1  MCV 102.2* 102.0* 99.6  --  98.1  PLT 225 236 259  --  241    Basic Metabolic Panel: Recent Labs  Lab 05/13/23 0235 05/15/23 1058 05/16/23 0533 05/18/23 0652 05/18/23 1311 05/18/23 1453 05/19/23 0437  NA 138 137 139 138 134*  --  138  K 4.6 4.4 4.4 4.6 4.7  --  5.1  CL 83* 91* 96* 94*  --   --  92*  CO2 >45* 36* 35* 38*   --   --  36*  GLUCOSE 154* 182* 114* 122*  --   --  234*  BUN 24* 31* 31* 22  --   --  29*  CREATININE 0.82 1.03 0.87 0.94  --  0.60* 1.00  CALCIUM 9.1 9.5 9.4 9.1  --   --  8.7*  MG 1.9  --   --   --   --  1.5*  --     GFR: Estimated Creatinine Clearance: 69.6 mL/min (by C-G formula based on SCr of 1 mg/dL).  Liver Function Tests: Recent Labs  Lab 05/18/23 0652 05/19/23 0437  AST 22 20  ALT 47* 45*  ALKPHOS 49 45  BILITOT 1.3* 0.7  PROT 6.4* 5.9*  ALBUMIN 3.1* 2.9*    CBG: No results for input(s): "GLUCAP" in the last 168 hours.   Recent Results (from the past 240 hours)  Resp panel by RT-PCR (RSV, Flu A&B, Covid) Anterior Nasal Swab     Status: None   Collection Time: 05/18/23  6:52 AM   Specimen: Anterior Nasal Swab  Result Value Ref Range Status   SARS Coronavirus 2 by RT PCR NEGATIVE NEGATIVE Final   Influenza A by PCR NEGATIVE NEGATIVE Final   Influenza B by PCR NEGATIVE NEGATIVE Final    Comment: (NOTE) The Xpert Xpress SARS-CoV-2/FLU/RSV plus assay is intended as an aid in the diagnosis of influenza from Nasopharyngeal swab specimens and should not be used as a sole basis for treatment. Nasal washings and aspirates are unacceptable for Xpert Xpress SARS-CoV-2/FLU/RSV testing.  Fact Sheet for Patients: BloggerCourse.com  Fact Sheet for Healthcare Providers: SeriousBroker.it  This test is not yet approved or cleared by the [place] FDA and has been authorized for detection and/or diagnosis of SARS-CoV-2 by FDA under an Emergency Use Authorization (EUA). This EUA will remain in effect (meaning this test can be used) for the duration of the COVID-19 declaration under Section 564(b)(1) of the Act, 21 U.S.C. section 360bbb-3(b)(1), unless the authorization is terminated or revoked.     Resp Syncytial Virus by PCR NEGATIVE NEGATIVE Final    Comment: (NOTE) Fact Sheet for  Patients: BloggerCourse.com  Fact Sheet for Healthcare Providers: SeriousBroker.it  This test is not yet approved or cleared by the [place] FDA and has been authorized for detection and/or diagnosis of SARS-CoV-2 by FDA under an Emergency Use Authorization (EUA). This EUA will remain in effect (meaning this test can be used) for the duration of the COVID-19 declaration under Section 564(b)(1) of the Act, 21 U.S.C. section 360bbb-3(b)(1), unless the authorization is terminated or revoked.  Performed at [facility], [address] [place], [place] [address]   Respiratory (~20 pathogens) panel by PCR     Status: None   Collection Time: 05/18/23  2:43  PM   Specimen: Nasopharyngeal Swab; Respiratory  Result Value Ref Range Status   Adenovirus NOT DETECTED NOT DETECTED Final   Coronavirus 229E NOT DETECTED NOT DETECTED Final    Comment: (NOTE) The Coronavirus on the Respiratory Panel, DOES NOT test for the novel  Coronavirus (2019 nCoV)    Coronavirus HKU1 NOT DETECTED NOT DETECTED Final   Coronavirus NL63 NOT DETECTED NOT DETECTED Final   Coronavirus OC43 NOT DETECTED NOT DETECTED Final   Metapneumovirus NOT DETECTED NOT DETECTED Final   Rhinovirus / Enterovirus NOT DETECTED NOT DETECTED Final   Influenza A NOT DETECTED NOT DETECTED Final   Influenza B NOT DETECTED NOT DETECTED Final   Parainfluenza Virus 1 NOT DETECTED NOT DETECTED Final   Parainfluenza Virus 2 NOT DETECTED NOT DETECTED Final   Parainfluenza Virus 3 NOT DETECTED NOT DETECTED Final   Parainfluenza Virus 4 NOT DETECTED NOT DETECTED Final   Respiratory Syncytial Virus NOT DETECTED NOT DETECTED Final   Bordetella pertussis NOT DETECTED NOT DETECTED Final   Bordetella Parapertussis NOT DETECTED NOT DETECTED Final   Chlamydophila pneumoniae NOT DETECTED NOT DETECTED Final   Mycoplasma pneumoniae NOT DETECTED NOT DETECTED Final    Comment: Performed at  [facility], [address], [place], [place] [address]         Radiology Studies: ECHOCARDIOGRAM LIMITED BUBBLE STUDY Result Date: 05/19/2023    ECHOCARDIOGRAM LIMITED REPORT   Patient Name:   Johnny Rodriguez Date of Exam: 05/19/2023 Medical Rec #:  [id number]          Height:       71.0 in Accession #:    [id number]         Weight:       160.0 lb Date of Birth:  Jan 13, 1951          BSA:          1.918 m Patient Age:    73 years           BP:           112/66 mmHg Patient Gender: M                  HR:           92 bpm. Exam Location:  Inpatient Procedure: Limited Echo, Cardiac Doppler, Color Doppler, Intracardiac            Opacification Agent and Saline Contrast Bubble Study (Both Spectral            and Color Flow Doppler were utilized during procedure). Indications:    I50.40* Unspecified combined systolic (congestive) and diastolic                 (congestive) heart failure  History:        Patient has prior history of Echocardiogram examinations, most                 recent 04/30/2023. Cardiomyopathy and CHF, CAD, Abnormal ECG,                 Arrythmias:Atrial Fibrillation, Signs/Symptoms:Dyspnea,                 Shortness of Breath and Syncope; Risk Factors:Current Smoker.                 Pulmonary embolus.  Sonographer:    Sheralyn Boatman RDCS Referring Phys: [phone] EKTA V PATEL IMPRESSIONS  1. Left ventricular ejection fraction, by estimation, is 35 to 40%.  The left ventricle has moderately decreased function. The left ventricle demonstrates global hypokinesis. There is mild concentric left ventricular hypertrophy. Left ventricular diastolic parameters are indeterminate.  2. The right ventricular size is mildly enlarged. There is mildly elevated pulmonary artery systolic pressure. The estimated right ventricular systolic pressure is 38.5 mmHg.  3. Left atrial size was moderately dilated.  4. A small pericardial effusion is present. The pericardial effusion is anterior to the right ventricle. There  is no evidence of cardiac tamponade.  5. The mitral valve is normal in structure. No evidence of mitral valve regurgitation. No evidence of mitral stenosis.  6. Aortic valve regurgitation is not visualized.  7. The inferior vena cava is dilated in size with >50% respiratory variability, suggesting right atrial pressure of 8 mmHg. Comparison(s): No significant change from prior study. Prior images reviewed side by side. FINDINGS  Left Ventricle: On non-contrast images concern for left ventricular apical thrombus. This does not appear on contrast images. Left ventricular ejection fraction, by estimation, is 35 to 40%. The left ventricle has moderately decreased function. The left  ventricle demonstrates global hypokinesis. Definity contrast agent was given IV to delineate the left ventricular endocardial borders. The left ventricular internal cavity size was normal in size. There is mild concentric left ventricular hypertrophy. Left ventricular diastolic parameters are indeterminate. Right Ventricle: The right ventricular size is mildly enlarged. There is mildly elevated pulmonary artery systolic pressure. The tricuspid regurgitant velocity is 2.76 m/s, and with an assumed right atrial pressure of 8 mmHg, the estimated right ventricular systolic pressure is 38.5 mmHg. Left Atrium: Left atrial size was moderately dilated. Pericardium: A small pericardial effusion is present. The pericardial effusion is anterior to the right ventricle. There is no evidence of cardiac tamponade. Mitral Valve: The mitral valve is normal in structure. No evidence of mitral valve stenosis. Tricuspid Valve: The tricuspid valve is normal in structure. Tricuspid valve regurgitation is mild . No evidence of tricuspid stenosis. Aortic Valve: Aortic valve regurgitation is not visualized. Aorta: The aortic root and ascending aorta are structurally normal, with no evidence of dilitation. Venous: The inferior vena cava is dilated in size with  greater than 50% respiratory variability, suggesting right atrial pressure of 8 mmHg. IAS/Shunts: Agitated saline contrast was given intravenously to evaluate for intracardiac shunting. Additional Comments: Spectral Doppler performed. Color Doppler performed.  LEFT VENTRICLE PLAX 2D LVIDd:         4.70 cm LVIDs:         4.20 cm LV PW:         1.20 cm LV IVS:        1.10 cm  LV Volumes (MOD) LV vol d, MOD A2C: 141.0 ml LV vol d, MOD A4C: 139.0 ml LV vol s, MOD A2C: 89.3 ml LV vol s, MOD A4C: 82.7 ml LV SV MOD A2C:     51.7 ml LV SV MOD A4C:     139.0 ml LV SV MOD BP:      54.9 ml RIGHT VENTRICLE         IVC TAPSE (M-mode): 2.2 cm  IVC diam: 2.20 cm RIGHT ATRIUM           Index RA Area:     25.10 cm RA Volume:   82.10 ml  42.81 ml/m   AORTA Ao Asc diam: 3.20 cm TRICUSPID VALVE TR Peak grad:   30.5 mmHg TR Vmax:        276.00 cm/s Riley Lam MD Electronically signed by [place] Dragon  Chandrasekhar MD Signature Date/Time: 05/19/2023/1:41:06 PM    Final    DG Chest Portable 1 View Result Date: 05/18/2023 CLINICAL DATA:  73 year old male with shortness of breath, respiratory distress. EXAM: PORTABLE CHEST 1 VIEW COMPARISON:  Portable chest 05/12/2023.  CTA chest 05/13/2023. FINDINGS: Portable AP views at 0700 hours. Hyperinflated lungs. Centrilobular emphysema and small layering left pleural effusion on the recent CTA. Lung volumes and ventilation stable since 05/12/2023. No pneumothorax, pulmonary edema, new lung opacity. Normal cardiac size and mediastinal contours. Visualized tracheal air column is within normal limits. Stable visualized osseous structures. Negative visible bowel gas. IMPRESSION: Emphysema (ICD10-J43.9) and small left pleural effusion stable from recent CTA. No new cardiopulmonary abnormality. Electronically Signed   By: Odessa Fleming M.D.   On: 05/18/2023 07:08        Scheduled Meds:  apixaban  5 mg Oral BID   docusate sodium  100 mg Oral BID   doxycycline  100 mg Oral Q12H   folic acid  1 mg  Oral Daily   ipratropium-albuterol  3 mL Nebulization BID   multivitamin with minerals  1 tablet Oral Daily   nicotine  14 mg Transdermal Daily   pantoprazole (PROTONIX) IV  40 mg Intravenous Q12H   predniSONE  40 mg Oral Q breakfast   sodium chloride flush  3 mL Intravenous Q12H   sodium chloride flush  3-10 mL Intravenous Q12H   thiamine (VITAMIN B1) injection  100 mg Intravenous Daily   Continuous Infusions:   LOS: 0 days      Huey Bienenstock, MD [facility]   To contact the attending provider between 7A-7P or the covering provider during after hours 7P-7A, please log into the web site [email or web address] and access using universal [place] password for that web site. If you do not have the password, please call the hospital operator.  05/19/2023, 2:46 PM

## 2023-05-19 NOTE — Evaluation (Addendum)
 Occupational Therapy Evaluation Patient Details Name: Johnny Rodriguez MRN: [id number] DOB: April 26, 1950 Today's Date: 05/19/2023   History of Present Illness   73 year old man who presented 05/18/23 with acute respiratory distress,  O2 sats in 70's with EMS. Recently admitted on 3/26-4/5 for respiratory distres requiring intubation. PMH significant for a-fib on coumadin, alcohol use, COPD on 3L oxygen, heart failure EF 40%.     Clinical Impressions Pt c/o SOB at rest, feels about the same as he did prior to admission. Pt lives alone, has a friend who can assist 1X/2X a week with transportation around her work schedule. Pt PLOF mod I, uses rollator indoors, power chair to get groceries in community, mod I ADLs. Pt at this time close to baseline but significantly limited with activity tolerance due to SOB. Pt requires 8L O2 to maintain levels above ~90% during ambulation, HR in 140s after ambulating 50 feet, 3-4 minutes to recover HR down to 100. HHOT recommended to maximize safety with ADLs and activity tolerance in home, will continue to see acutely to progress as able and maximize activity tolerance.     If plan is discharge home, recommend the following:   A little help with walking and/or transfers;A little help with bathing/dressing/bathroom;Assistance with cooking/housework;Assist for transportation     Functional Status Assessment   Patient has had a recent decline in their functional status and demonstrates the ability to make significant improvements in function in a reasonable and predictable amount of time.     Equipment Recommendations   Tub/shower seat     Recommendations for Other Services         Precautions/Restrictions   Precautions Precautions: Fall Recall of Precautions/Restrictions: Intact Precaution/Restrictions Comments: watch sats Restrictions Weight Bearing Restrictions Per Provider Order: No     Mobility Bed Mobility Overal bed mobility: Needs  Assistance             General bed mobility comments: in recliner    Transfers Overall transfer level: Needs assistance Equipment used: Rolling walker (2 wheels) Transfers: Sit to/from Stand, Bed to chair/wheelchair/BSC Sit to Stand: Supervision     Step pivot transfers: Supervision     General transfer comment: supervision for safety, SOB quickly      Balance Overall balance assessment: Needs assistance Sitting-balance support: No upper extremity supported, Feet supported Sitting balance-Leahy Scale: Fair     Standing balance support: Reliant on assistive device for balance, Single extremity supported Standing balance-Leahy Scale: Poor                             ADL either performed or assessed with clinical judgement   ADL Overall ADL's : Needs assistance/impaired                                       General ADL Comments: set up/supervision     Vision Baseline Vision/History: 1 Wears glasses Ability to See in Adequate Light: 0 Adequate Patient Visual Report: No change from baseline       Perception         Praxis         Pertinent Vitals/Pain Pain Assessment Pain Assessment: No/denies pain     Extremity/Trunk Assessment Upper Extremity Assessment Upper Extremity Assessment: LUE deficits/detail LUE Deficits / Details: history of L shoulder fx, painful ROM, limited overhead, reaching, not able to reach  behind head, painful to bring hand to touch nose. LUE: Shoulder pain with ROM LUE Sensation: WNL LUE Coordination: decreased gross motor   Lower Extremity Assessment Lower Extremity Assessment: Generalized weakness   Cervical / Trunk Assessment Cervical / Trunk Assessment: Kyphotic   Communication Communication Communication: No apparent difficulties   Cognition Arousal: Alert Behavior During Therapy: WFL for tasks assessed/performed Cognition: No apparent impairments             OT - Cognition Comments:  Pt had difficulty with year, not able to get correct. A/O to month/day. Grossly WFLs.                 Following commands: Intact       Cueing  General Comments   Cueing Techniques: Verbal cues  8L O2 to maintain above 90% during ambulation   Exercises     Shoulder Instructions      Home Living Family/patient expects to be discharged to:: Private residence Living Arrangements: Alone Available Help at Discharge: Friend(s);Available PRN/intermittently Type of Home: Apartment Home Access: Level entry     Home Layout: One level     Bathroom Shower/Tub: Tub/shower unit;Walk-in shower   Bathroom Toilet: Standard     Home Equipment: Rollator (4 wheels);Wheelchair - power   Additional Comments: Pt lives alone, has a friend who can help occasionally.      Prior Functioning/Environment Prior Level of Function : Independent/Modified Independent             Mobility Comments: only walks short distances in the apartment with rollator; mostly uses power chair inside and community ADLs Comments: reports he drives his powerchair to get his groceries    OT Problem List: Decreased strength;Decreased range of motion;Decreased activity tolerance;Cardiopulmonary status limiting activity;Impaired UE functional use   OT Treatment/Interventions: Self-care/ADL training;Therapeutic exercise;Energy conservation;DME and/or AE instruction;Therapeutic activities;Patient/family education      OT Goals(Current goals can be found in the care plan section)   Acute Rehab OT Goals Patient Stated Goal: to return home OT Goal Formulation: With patient Time For Goal Achievement: 06/02/23 Potential to Achieve Goals: Good ADL Goals Pt Will Perform Upper Body Dressing: with modified independence Pt Will Perform Lower Body Dressing: with modified independence Pt Will Transfer to Toilet: with modified independence Pt Will Perform Toileting - Clothing Manipulation and hygiene: with  modified independence Pt/caregiver will Perform Home Exercise Program: Increased ROM;Increased strength;Left upper extremity;With theraband;Independently;With written HEP provided   OT Frequency:  Min 2X/week    Co-evaluation              AM-PAC OT "6 Clicks" Daily Activity     Outcome Measure Help from another person eating meals?: None Help from another person taking care of personal grooming?: A Little Help from another person toileting, which includes using toliet, bedpan, or urinal?: A Little Help from another person bathing (including washing, rinsing, drying)?: A Little Help from another person to put on and taking off regular upper body clothing?: A Little Help from another person to put on and taking off regular lower body clothing?: A Little 6 Click Score: 19   End of Session Equipment Utilized During Treatment: Rolling walker (2 wheels);Oxygen Nurse Communication: Mobility status  Activity Tolerance: Patient tolerated treatment well Patient left: in chair;with call bell/phone within reach;with chair alarm set  OT Visit Diagnosis: Unsteadiness on feet (R26.81);Muscle weakness (generalized) (M62.81)                Time: 1610-9604 OT Time Calculation (min): 28  min Charges:  OT General Charges $OT Visit: 1 Visit OT Evaluation $OT Eval Low Complexity: 1 Low OT Treatments $Self Care/Home Management : 8-22 mins  [place], OTR/L   Alexis Goodell 05/19/2023, 11:30 AM

## 2023-05-19 NOTE — Evaluation (Addendum)
 Physical Therapy Evaluation Patient Details Name: Johnny Rodriguez MRN: [id number] DOB: [birth year] Today's Date: 05/19/2023  History of Present Illness  73 year old man who presented 05/18/23 with acute respiratory distress,  O2 sats in 70's with EMS. Recently admitted on 3/26-4/5 for respiratory distres requiring intubation. PMH significant for a-fib on coumadin, alcohol use, COPD on 3L oxygen, heart failure EF 40%.   Clinical Impression  Pt admitted with above diagnosis. Eager to get out of bed today. Transfers from multiple surfaces and ambulates with rollator at a supervision level during evaluation. With 3/3 dyspnea, HR into 140s briefly. On 8L HFNC but unable to obtain SpO2 despite multiple attempts. Dyspnea recovers within 3 minutes of seated rest. Pt currently with functional limitations due to the deficits listed below (see PT Problem List). Pt will benefit from acute skilled PT to increase their independence and safety with mobility to allow discharge.         If plan is discharge home, recommend the following: A little help with walking and/or transfers;A little help with bathing/dressing/bathroom;Assistance with cooking/housework;Assist for transportation   Can travel by private vehicle        Equipment Recommendations None recommended by PT  Recommendations for Other Services       Functional Status Assessment Patient has had a recent decline in their functional status and demonstrates the ability to make significant improvements in function in a reasonable and predictable amount of time.     Precautions / Restrictions Precautions Precautions: Fall Recall of Precautions/Restrictions: Intact Precaution/Restrictions Comments: watch sats Restrictions Weight Bearing Restrictions Per Provider Order: No      Mobility  Bed Mobility Overal bed mobility: Needs Assistance Bed Mobility: Supine to Sit     Supine to sit: Supervision     General bed mobility comments:  Supervision for safety, extra time, managed lines/leads for pt only.    Transfers Overall transfer level: Needs assistance Equipment used: Rollator (4 wheels) Transfers: Sit to/from Stand Sit to Stand: Supervision           General transfer comment: Supervision for safety, mild instability. Rollator for support upon rising. Performed from bed and toilet with rail use. Good control with descent onto toilet and recliner. Cues to engage brakes.    Ambulation/Gait Ambulation/Gait assistance: Supervision Gait Distance (Feet): 20 Feet (x2) Assistive device: Rolling walker (2 wheels) Gait Pattern/deviations: Step-through pattern, Decreased stride length Gait velocity: decreased Gait velocity interpretation: <1.8 ft/sec, indicate of risk for recurrent falls   General Gait Details: Slower gait speed, supervision for safety with assist for line/lead management. Fair to good control of RW in congested areas, some difficulty but did not require physical intervention. Dyspnea 3/3 HR into 140s briefly. On 8L HFNC but unable to obtain SpO2 despite multiple attempts. HR returns to upper 90s at rest, and dyspnea resolves at rest.  Stairs            Wheelchair Mobility     Tilt Bed    Modified Rankin (Stroke Patients Only)       Balance Overall balance assessment: Needs assistance Sitting-balance support: No upper extremity supported, Feet supported Sitting balance-Leahy Scale: Fair     Standing balance support: Reliant on assistive device for balance, Single extremity supported Standing balance-Leahy Scale: Poor                               Pertinent Vitals/Pain Pain Assessment Pain Assessment: No/denies pain  Home Living Family/patient expects to be discharged to:: Private residence Living Arrangements: Alone Available Help at Discharge: Friend(s) Type of Home: Apartment Home Access: Level entry       Home Layout: One level Home Equipment: Rollator  (4 wheels);Wheelchair - power      Prior Function Prior Level of Function : Independent/Modified Independent             Mobility Comments: only walks short distances in the apartment with rollator; mostly uses power chair inside and community ADLs Comments: reports he drives his powerchair to get his groceries     Extremity/Trunk Assessment   Upper Extremity Assessment Upper Extremity Assessment: Defer to OT evaluation    Lower Extremity Assessment Lower Extremity Assessment: Generalized weakness    Cervical / Trunk Assessment Cervical / Trunk Assessment: Kyphotic  Communication   Communication Communication: No apparent difficulties    Cognition Arousal: Alert Behavior During Therapy: WFL for tasks assessed/performed   PT - Cognitive impairments: Problem solving, Safety/Judgement                       PT - Cognition Comments: required cues for safe safety with RW Following commands: Intact       Cueing Cueing Techniques: Verbal cues     General Comments General comments (skin integrity, edema, etc.): 8L HFNC SpO2 mid 90s when PT entered room and pt resting in bed mild dyspnea.    Exercises     Assessment/Plan    PT Assessment Patient needs continued PT services  PT Problem List Decreased strength;Decreased activity tolerance;Decreased balance;Decreased mobility;Decreased knowledge of use of DME;Decreased safety awareness;Cardiopulmonary status limiting activity       PT Treatment Interventions DME instruction;Gait training;Functional mobility training;Therapeutic activities;Therapeutic exercise;Balance training;Cognitive remediation;Patient/family education;Neuromuscular re-education    PT Goals (Current goals can be found in the Care Plan section)  Acute Rehab PT Goals Patient Stated Goal: return home PT Goal Formulation: With patient Time For Goal Achievement: 06/02/23 Potential to Achieve Goals: Fair    Frequency Min 2X/week      Co-evaluation               AM-PAC PT "6 Clicks" Mobility  Outcome Measure Help needed turning from your back to your side while in a flat bed without using bedrails?: None Help needed moving from lying on your back to sitting on the side of a flat bed without using bedrails?: A Little Help needed moving to and from a bed to a chair (including a wheelchair)?: A Little Help needed standing up from a chair using your arms (e.g., wheelchair or bedside chair)?: A Little Help needed to walk in hospital room?: A Little Help needed climbing 3-5 steps with a railing? : Total 6 Click Score: 17    End of Session Equipment Utilized During Treatment: Gait belt;Oxygen Activity Tolerance: Patient tolerated treatment well Patient left: with call bell/phone within reach;in chair;with chair alarm set Nurse Communication: Mobility status PT Visit Diagnosis: Muscle weakness (generalized) (M62.81);Other abnormalities of gait and mobility (R26.89);Unsteadiness on feet (R26.81);Difficulty in walking, not elsewhere classified (R26.2)    Time: 1610-9604 PT Time Calculation (min) (ACUTE ONLY): 37 min   Charges:   PT Evaluation $PT Eval Low Complexity: 1 Low PT Treatments $Therapeutic Activity: 8-22 mins PT General Charges $$ ACUTE PT VISIT: 1 Visit         Kathlyn [place], PT, DPT [facility]  Rehabilitation Services Physical Therapist Office: [phone] Website: [email or web address]   Addendum: D/c rec for  HHPT initially selected but other carried over; to clarify. PT recommend HHPT follow-up at d/c and ideally will transition to pulmonary rehab when capable of safely transporting to a pulmonary rehab clinic.   Berton Mount 05/19/2023, 9:48 AM

## 2023-05-20 DIAGNOSIS — I2510 Atherosclerotic heart disease of native coronary artery without angina pectoris: Secondary | ICD-10-CM | POA: Diagnosis not present

## 2023-05-20 DIAGNOSIS — I5022 Chronic systolic (congestive) heart failure: Secondary | ICD-10-CM | POA: Diagnosis not present

## 2023-05-20 DIAGNOSIS — I480 Paroxysmal atrial fibrillation: Secondary | ICD-10-CM

## 2023-05-20 LAB — BASIC METABOLIC PANEL WITH GFR
Anion gap: 9 (ref 5–15)
BUN: 32 mg/dL — ABNORMAL HIGH (ref 8–23)
CO2: 34 mmol/L — ABNORMAL HIGH (ref 22–32)
Calcium: 9 mg/dL (ref 8.9–10.3)
Chloride: 92 mmol/L — ABNORMAL LOW (ref 98–111)
Creatinine, Ser: 0.83 mg/dL (ref 0.61–1.24)
GFR, Estimated: >60 mL/min (ref >60)
Glucose, Bld: 158 mg/dL — ABNORMAL HIGH (ref 70–99)
Potassium: 5.4 mmol/L — ABNORMAL HIGH (ref 3.5–5.1)
Sodium: 135 mmol/L (ref 135–145)

## 2023-05-20 LAB — CBC
HCT: 40.2 % (ref 39.0–52.0)
Hemoglobin: 12.6 g/dL — ABNORMAL LOW (ref 13.0–17.0)
MCH: 31.1 pg (ref 26.0–34.0)
MCHC: 31.3 g/dL (ref 30.0–36.0)
MCV: 99.3 fL (ref 80.0–100.0)
Platelets: 278 10*3/uL (ref 150–400)
RBC: 4.05 MIL/uL — ABNORMAL LOW (ref 4.22–5.81)
RDW: 13.9 % (ref 11.5–15.5)
WBC: 15.1 10*3/uL — ABNORMAL HIGH (ref 4.0–10.5)
nRBC: 0 % (ref 0.0–0.2)

## 2023-05-20 MED ORDER — PREDNISONE 20 MG PO TABS
40.0000 mg | ORAL_TABLET | Freq: Every day | ORAL | Status: DC
  Administered 2023-05-21: 40 mg via ORAL
  Filled 2023-05-20: qty 2

## 2023-05-20 MED ORDER — SODIUM ZIRCONIUM CYCLOSILICATE 10 G PO PACK
10.0000 g | PACK | Freq: Two times a day (BID) | ORAL | Status: AC
  Administered 2023-05-20 (×2): 10 g via ORAL
  Filled 2023-05-20 (×2): qty 1

## 2023-05-20 MED ORDER — BUDESONIDE 0.25 MG/2ML IN SUSP
0.2500 mg | Freq: Two times a day (BID) | RESPIRATORY_TRACT | Status: DC
  Administered 2023-05-20 – 2023-05-21 (×2): 0.25 mg via RESPIRATORY_TRACT
  Filled 2023-05-20 (×2): qty 2

## 2023-05-20 MED ORDER — REVEFENACIN 175 MCG/3ML IN SOLN
175.0000 ug | Freq: Every day | RESPIRATORY_TRACT | Status: DC
  Administered 2023-05-21: 175 ug via RESPIRATORY_TRACT
  Filled 2023-05-20: qty 3

## 2023-05-20 MED ORDER — ARFORMOTEROL TARTRATE 15 MCG/2ML IN NEBU
15.0000 ug | INHALATION_SOLUTION | Freq: Two times a day (BID) | RESPIRATORY_TRACT | Status: DC
  Administered 2023-05-20 – 2023-05-21 (×2): 15 ug via RESPIRATORY_TRACT
  Filled 2023-05-20: qty 2

## 2023-05-20 NOTE — Plan of Care (Signed)
 progressing

## 2023-05-20 NOTE — Progress Notes (Signed)
   05/20/23 0955  Mobility  Activity Ambulated with assistance in hallway  Level of Assistance Standby assist, set-up cues, supervision of patient - no hands on  Assistive Device Front wheel walker  Distance Ambulated (ft) 200 ft  Activity Response Tolerated fair  Mobility Referral Yes  Mobility visit 1 Mobility  Mobility Specialist Start Time (ACUTE ONLY) 0933  Mobility Specialist Stop Time (ACUTE ONLY) 0955  Mobility Specialist Time Calculation (min) (ACUTE ONLY) 22 min   Mobility Specialist: Progress Note  Pre-Mobility:      HR 99, SpO2 93% 4L During Mobility:             SpO2 88-91% 4L% Post-Mobility:    HR 97, SpO2 92% 4L  Pt agreeable to mobility session - received in chair. Pt with no complaints. Pt performing PLB throughout. Returned to chair with all needs met - call bell within reach.   Barnie Mort, BS Mobility Specialist Please contact via SecureChat or  Rehab office at [phone].

## 2023-05-20 NOTE — TOC Progression Note (Addendum)
 Transition of Care [facility]) - Progression Note    Patient Details  Name: Johnny Rodriguez MRN: [id number] Date of Birth: 1950/10/14  [facility] CM/SW Contact  Gordy Clement, RN Phone Number: 05/20/2023, 9:15 AM  Clinical Narrative:    Update: 11:36 AM  Patient declines recommended tub/shower seat- he states he has one  RNCM will add HH aide to services from [facility]   Patient being recommended [facility]  [facility] had already been arranged from previous discharge last Saturday  AVS updated     TOC will continue to follow patient for any additional discharge needs           Expected Discharge Plan and Services                                               Social Determinants of Health (SDOH) Interventions SDOH Screenings   Food Insecurity: No Food Insecurity (05/18/2023)  Housing: Low Risk  (05/18/2023)  Transportation Needs: Unmet Transportation Needs (05/18/2023)  Utilities: Not At Risk (05/18/2023)  Alcohol Screen: Low Risk  (09/11/2020)  Recent Concern: Alcohol Screen - Medium Risk (08/07/2020)  Depression (PHQ2-9): Low Risk  (11/21/2022)  Financial Resource Strain: Low Risk  (11/21/2022)  Physical Activity: Inactive (11/21/2022)  Social Connections: Socially Isolated (05/18/2023)  Stress: No Stress Concern Present (11/21/2022)  Tobacco Use: High Risk (05/18/2023)  Health Literacy: Adequate Health Literacy (11/21/2022)    Readmission Risk Interventions     No data to display

## 2023-05-20 NOTE — Plan of Care (Signed)

## 2023-05-20 NOTE — Progress Notes (Signed)
 PROGRESS NOTE        PATIENT DETAILS Name: Johnny Rodriguez Age: 73 y.o. Sex: male Date of Birth: 16-Oct-1950 Admit Date: 05/18/2023 Admitting Physician Gertha Calkin, MD NWG:[id number], Amy J, NP  Brief Summary: Patient is a 73 y.o.  male with history of COPD on 3 L of oxygen at home, HFrEF, PAF, HTN-who was just hospitalized for  acute on chronic hypoxic respiratory failure secondary to COPD exacerbation due to haemophilus influenza infection-discharged on 4/5 at his own request-presented back to the hospital with worsening shortness of breath-found to have acute on chronic hypoxic respiratory failure required BiPAP on initial presentation and subsequently admitted to the hospitalist service.   Significant events: 3/26>> presented with shortness of breath-intubated-admit to ICU 3/28>> extubated 3/30>> transferred to [facility] 4/05>> discharged home 4/07>> back to the ED with worsening shortness of breath-BiPAP-admit to [facility].  Significant studies: 3/20>> echo: EF 40-45% 3/26>> CXR: COPD-no obvious infiltrates. 4/02>> CTA chest: Right lower lobe PE 4/07>> CXR: Emphysema/small left pleural effusion.  Significant microbiology data: 3/26>> COVID/influenza/RSV PCR: Negative 3/26>> respiratory virus panel: Negative 3/26>> tracheal aspirate: Haemophilus influenza A 4/07>> COVID/influenza/RSV PCR: Negative 4/07>> Resp virus panel: None great.  Procedures: 3/26-3/28 >>ETT  Consults: None  Subjective: Feels better-Down to 4-5 L of oxygen.  Objective: Vitals: Blood pressure 110/84, pulse 85, temperature 97.6 F (36.4 C), temperature source Oral, resp. rate 19, height 5\' 11"  (1.803 m), weight 72.6 kg, SpO2 100%.   Exam: Gen Exam:Alert awake-not in any distress HEENT:atraumatic, normocephalic Chest: B/L clear to auscultation anteriorly CVS:S1S2 regular Abdomen:soft non tender, non distended Extremities:no edema Neurology: Non focal Skin: no rash  Pertinent  Labs/Radiology:    Latest Ref Rng & Units 05/20/2023    4:27 AM 05/19/2023    4:37 AM 05/18/2023    1:11 PM  CBC  WBC 4.0 - 10.5 K/uL 15.1  10.7    Hemoglobin 13.0 - 17.0 g/dL 65.7  84.6  96.2   Hematocrit 39.0 - 52.0 % 40.2  41.1  45.0   Platelets 150 - 400 K/uL 278  241      Lab Results  Component Value Date   NA 135 05/20/2023   K 5.4 (H) 05/20/2023   CL 92 (L) 05/20/2023   CO2 34 (H) 05/20/2023      Assessment/Plan: Acute on chronic respiratory failure secondary to COPD exacerbation Improve-lungs essentially clear this morning Change Solu-Medrol to prednisone Scheduled bronchodilators No obvious pneumonia-can stop doxycycline. Incentive spirometry/flutter valve.  Chronic atrial fibrillation Rate controlled Continue Eliquis  Chronic HFrEF Euvolemic As needed Lasix.  Known history of PE Small PE seen on recent CTA chest-not sure if this is a residual PE from prior PE. Suspect this is a incidental finding and not playing a major role in his symptomatology. Already on anticoagulation  Chronic pain syndrome Neurontin  Hyperkalemia Unclear etiology Lokelma x 1 dose-repeat electrolytes tomorrow  Leukocytosis Likely due to steroids-no indication of infection Follow CBC  Debility/deconditioning PT/OT-Home health recommended  History of tobacco use Unfortunately-continues to smoke-acknowledges smoking when he left the hospital Counseling  Nutrition Status: Nutrition Problem: Moderate Malnutrition Etiology: chronic illness Signs/Symptoms: severe muscle depletion, moderate fat depletion, moderate muscle depletion, percent weight loss Percent weight loss: 16 % Interventions: MVI, Magic cup, Snacks, Ensure Enlive (each supplement provides 350kcal and 20 grams of protein)  BMI: Estimated body mass index is 22.32  kg/m as calculated from the following:   Height as of this encounter: 5\' 11"  (1.803 m).   Weight as of this encounter: 72.6 kg.   Code status:   Code  Status: Full Code   DVT Prophylaxis:  apixaban (ELIQUIS) tablet 5 mg    Family Communication: None at bedside   Disposition Plan: Status is: Inpatient Remains inpatient appropriate because: Severity of illness   Planned Discharge Destination:Home health   Diet: Diet Order             Diet Heart Room service appropriate? Yes; Fluid consistency: Thin  Diet effective now                     Antimicrobial agents: Anti-infectives (From admission, onward)    Start     Dose/Rate Route Frequency Ordered Stop   05/18/23 1500  doxycycline (VIBRA-TABS) tablet 100 mg        100 mg Oral Every 12 hours 05/18/23 1446 05/23/23 0959        MEDICATIONS: Scheduled Meds:  apixaban  5 mg Oral BID   docusate sodium  100 mg Oral BID   doxycycline  100 mg Oral Q12H   folic acid  1 mg Oral Daily   ipratropium-albuterol  3 mL Nebulization BID   methylPREDNISolone (SOLU-MEDROL) injection  125 mg Intravenous Daily   multivitamin with minerals  1 tablet Oral Daily   nicotine  14 mg Transdermal Daily   pantoprazole (PROTONIX) IV  40 mg Intravenous Q12H   sodium chloride flush  3 mL Intravenous Q12H   sodium chloride flush  3-10 mL Intravenous Q12H   sodium zirconium cyclosilicate  10 g Oral BID   thiamine (VITAMIN B1) injection  100 mg Intravenous Daily   Continuous Infusions: PRN Meds:.acetaminophen **OR** acetaminophen, albuterol, bisacodyl, guaiFENesin, hydrALAZINE, HYDROcodone-acetaminophen, morphine injection, ondansetron **OR** ondansetron (ZOFRAN) IV, polyethylene glycol, sodium chloride, sodium chloride flush   I have personally reviewed following labs and imaging studies  LABORATORY DATA: CBC: Recent Labs  Lab 05/15/23 1058 05/18/23 0652 05/18/23 1311 05/19/23 0437 05/20/23 0427  WBC 10.7* 11.8*  --  10.7* 15.1*  NEUTROABS  --  9.6*  --   --   --   HGB 13.6 13.7 15.3 12.9* 12.6*  HCT 44.9 44.4 45.0 41.1 40.2  MCV 102.0* 99.6  --  98.1 99.3  PLT 236 259  --  241  278    Basic Metabolic Panel: Recent Labs  Lab 05/15/23 1058 05/16/23 0533 05/18/23 0652 05/18/23 1311 05/18/23 1453 05/19/23 0437 05/20/23 0427  NA 137 139 138 134*  --  138 135  K 4.4 4.4 4.6 4.7  --  5.1 5.4*  CL 91* 96* 94*  --   --  92* 92*  CO2 36* 35* 38*  --   --  36* 34*  GLUCOSE 182* 114* 122*  --   --  234* 158*  BUN 31* 31* 22  --   --  29* 32*  CREATININE 1.03 0.87 0.94  --  0.60* 1.00 0.83  CALCIUM 9.5 9.4 9.1  --   --  8.7* 9.0  MG  --   --   --   --  1.5*  --   --     GFR: Estimated Creatinine Clearance: 83.8 mL/min (by C-G formula based on SCr of 0.83 mg/dL).  Liver Function Tests: Recent Labs  Lab 05/18/23 0652 05/19/23 0437  AST 22 20  ALT 47* 45*  ALKPHOS 49 45  BILITOT 1.3* 0.7  PROT 6.4* 5.9*  ALBUMIN 3.1* 2.9*    No results for input(s): "LIPASE", "AMYLASE" in the last 168 hours. No results for input(s): "AMMONIA" in the last 168 hours.  Coagulation Profile: No results for input(s): "INR", "PROTIME" in the last 168 hours.  Cardiac Enzymes: No results for input(s): "CKTOTAL", "CKMB", "CKMBINDEX", "TROPONINI" in the last 168 hours.  BNP (last 3 results) No results for input(s): "PROBNP" in the last 8760 hours.  Lipid Profile: No results for input(s): "CHOL", "HDL", "LDLCALC", "TRIG", "CHOLHDL", "LDLDIRECT" in the last 72 hours.  Thyroid Function Tests: No results for input(s): "TSH", "T4TOTAL", "FREET4", "T3FREE", "THYROIDAB" in the last 72 hours.  Anemia Panel: No results for input(s): "VITAMINB12", "FOLATE", "FERRITIN", "TIBC", "IRON", "RETICCTPCT" in the last 72 hours.  Urine analysis:    Component Value Date/Time   COLORURINE AMBER (A) 05/06/2023 1815   APPEARANCEUR HAZY (A) 05/06/2023 1815   LABSPEC 1.017 05/06/2023 1815   PHURINE 5.0 05/06/2023 1815   GLUCOSEU 50 (A) 05/06/2023 1815   HGBUR SMALL (A) 05/06/2023 1815   BILIRUBINUR NEGATIVE 05/06/2023 1815   KETONESUR 5 (A) 05/06/2023 1815   PROTEINUR 100 (A) 05/06/2023  1815   UROBILINOGEN 0.2 12/08/2012 1516   NITRITE NEGATIVE 05/06/2023 1815   LEUKOCYTESUR NEGATIVE 05/06/2023 1815    Sepsis Labs: Lactic Acid, Venous    Component Value Date/Time   LATICACIDVEN 4.1 (HH) 05/06/2023 1907    MICROBIOLOGY: Recent Results (from the past 240 hours)  Resp panel by RT-PCR (RSV, Flu A&B, Covid) Anterior Nasal Swab     Status: None   Collection Time: 05/18/23  6:52 AM   Specimen: Anterior Nasal Swab  Result Value Ref Range Status   SARS Coronavirus 2 by RT PCR NEGATIVE NEGATIVE Final   Influenza A by PCR NEGATIVE NEGATIVE Final   Influenza B by PCR NEGATIVE NEGATIVE Final    Comment: (NOTE) The Xpert Xpress SARS-CoV-2/FLU/RSV plus assay is intended as an aid in the diagnosis of influenza from Nasopharyngeal swab specimens and should not be used as a sole basis for treatment. Nasal washings and aspirates are unacceptable for Xpert Xpress SARS-CoV-2/FLU/RSV testing.  Fact Sheet for Patients: BloggerCourse.com  Fact Sheet for Healthcare Providers: SeriousBroker.it  This test is not yet approved or cleared by the [place] FDA and has been authorized for detection and/or diagnosis of SARS-CoV-2 by FDA under an Emergency Use Authorization (EUA). This EUA will remain in effect (meaning this test can be used) for the duration of the COVID-19 declaration under Section 564(b)(1) of the Act, 21 U.S.C. section 360bbb-3(b)(1), unless the authorization is terminated or revoked.     Resp Syncytial Virus by PCR NEGATIVE NEGATIVE Final    Comment: (NOTE) Fact Sheet for Patients: BloggerCourse.com  Fact Sheet for Healthcare Providers: SeriousBroker.it  This test is not yet approved or cleared by the [place] FDA and has been authorized for detection and/or diagnosis of SARS-CoV-2 by FDA under an Emergency Use Authorization (EUA). This EUA will  remain in effect (meaning this test can be used) for the duration of the COVID-19 declaration under Section 564(b)(1) of the Act, 21 U.S.C. section 360bbb-3(b)(1), unless the authorization is terminated or revoked.  Performed at [facility], [address] [place], [place] [address]   Respiratory (~20 pathogens) panel by PCR     Status: None   Collection Time: 05/18/23  2:43 PM   Specimen: Nasopharyngeal Swab; Respiratory  Result Value Ref Range Status   Adenovirus NOT DETECTED  NOT DETECTED Final   Coronavirus 229E NOT DETECTED NOT DETECTED Final    Comment: (NOTE) The Coronavirus on the Respiratory Panel, DOES NOT test for the novel  Coronavirus (2019 nCoV)    Coronavirus HKU1 NOT DETECTED NOT DETECTED Final   Coronavirus NL63 NOT DETECTED NOT DETECTED Final   Coronavirus OC43 NOT DETECTED NOT DETECTED Final   Metapneumovirus NOT DETECTED NOT DETECTED Final   Rhinovirus / Enterovirus NOT DETECTED NOT DETECTED Final   Influenza A NOT DETECTED NOT DETECTED Final   Influenza B NOT DETECTED NOT DETECTED Final   Parainfluenza Virus 1 NOT DETECTED NOT DETECTED Final   Parainfluenza Virus 2 NOT DETECTED NOT DETECTED Final   Parainfluenza Virus 3 NOT DETECTED NOT DETECTED Final   Parainfluenza Virus 4 NOT DETECTED NOT DETECTED Final   Respiratory Syncytial Virus NOT DETECTED NOT DETECTED Final   Bordetella pertussis NOT DETECTED NOT DETECTED Final   Bordetella Parapertussis NOT DETECTED NOT DETECTED Final   Chlamydophila pneumoniae NOT DETECTED NOT DETECTED Final   Mycoplasma pneumoniae NOT DETECTED NOT DETECTED Final    Comment: Performed at [facility], [address], [place], [place] [address]    RADIOLOGY STUDIES/RESULTS: ECHOCARDIOGRAM LIMITED BUBBLE STUDY Result Date: 05/19/2023    ECHOCARDIOGRAM LIMITED REPORT   Patient Name:   Johnny Rodriguez Date of Exam: 05/19/2023 Medical Rec #:  [id number]          Height:       71.0 in Accession #:    [id number]          Weight:       160.0 lb Date of Birth:  April 04, 1950          BSA:          1.918 m Patient Age:    73 years           BP:           112/66 mmHg Patient Gender: M                  HR:           92 bpm. Exam Location:  Inpatient Procedure: Limited Echo, Cardiac Doppler, Color Doppler, Intracardiac            Opacification Agent and Saline Contrast Bubble Study (Both Spectral            and Color Flow Doppler were utilized during procedure). Indications:    I50.40* Unspecified combined systolic (congestive) and diastolic                 (congestive) heart failure  History:        Patient has prior history of Echocardiogram examinations, most                 recent 04/30/2023. Cardiomyopathy and CHF, CAD, Abnormal ECG,                 Arrythmias:Atrial Fibrillation, Signs/Symptoms:Dyspnea,                 Shortness of Breath and Syncope; Risk Factors:Current Smoker.                 Pulmonary embolus.  Sonographer:    Sheralyn Boatman RDCS Referring Phys: [phone] EKTA V PATEL IMPRESSIONS  1. Left ventricular ejection fraction, by estimation, is 35 to 40%. The left ventricle has moderately decreased function. The left ventricle demonstrates global hypokinesis. There is mild concentric left ventricular hypertrophy. Left ventricular diastolic  parameters are indeterminate.  2. The right ventricular size is mildly enlarged. There is mildly elevated pulmonary artery systolic pressure. The estimated right ventricular systolic pressure is 38.5 mmHg.  3. Left atrial size was moderately dilated.  4. A small pericardial effusion is present. The pericardial effusion is anterior to the right ventricle. There is no evidence of cardiac tamponade.  5. The mitral valve is normal in structure. No evidence of mitral valve regurgitation. No evidence of mitral stenosis.  6. Aortic valve regurgitation is not visualized.  7. The inferior vena cava is dilated in size with >50% respiratory variability, suggesting right atrial pressure of 8 mmHg.  Comparison(s): No significant change from prior study. Prior images reviewed side by side. FINDINGS  Left Ventricle: On non-contrast images concern for left ventricular apical thrombus. This does not appear on contrast images. Left ventricular ejection fraction, by estimation, is 35 to 40%. The left ventricle has moderately decreased function. The left  ventricle demonstrates global hypokinesis. Definity contrast agent was given IV to delineate the left ventricular endocardial borders. The left ventricular internal cavity size was normal in size. There is mild concentric left ventricular hypertrophy. Left ventricular diastolic parameters are indeterminate. Right Ventricle: The right ventricular size is mildly enlarged. There is mildly elevated pulmonary artery systolic pressure. The tricuspid regurgitant velocity is 2.76 m/s, and with an assumed right atrial pressure of 8 mmHg, the estimated right ventricular systolic pressure is 38.5 mmHg. Left Atrium: Left atrial size was moderately dilated. Pericardium: A small pericardial effusion is present. The pericardial effusion is anterior to the right ventricle. There is no evidence of cardiac tamponade. Mitral Valve: The mitral valve is normal in structure. No evidence of mitral valve stenosis. Tricuspid Valve: The tricuspid valve is normal in structure. Tricuspid valve regurgitation is mild . No evidence of tricuspid stenosis. Aortic Valve: Aortic valve regurgitation is not visualized. Aorta: The aortic root and ascending aorta are structurally normal, with no evidence of dilitation. Venous: The inferior vena cava is dilated in size with greater than 50% respiratory variability, suggesting right atrial pressure of 8 mmHg. IAS/Shunts: Agitated saline contrast was given intravenously to evaluate for intracardiac shunting. Additional Comments: Spectral Doppler performed. Color Doppler performed.  LEFT VENTRICLE PLAX 2D LVIDd:         4.70 cm LVIDs:         4.20 cm LV PW:          1.20 cm LV IVS:        1.10 cm  LV Volumes (MOD) LV vol d, MOD A2C: 141.0 ml LV vol d, MOD A4C: 139.0 ml LV vol s, MOD A2C: 89.3 ml LV vol s, MOD A4C: 82.7 ml LV SV MOD A2C:     51.7 ml LV SV MOD A4C:     139.0 ml LV SV MOD BP:      54.9 ml RIGHT VENTRICLE         IVC TAPSE (M-mode): 2.2 cm  IVC diam: 2.20 cm RIGHT ATRIUM           Index RA Area:     25.10 cm RA Volume:   82.10 ml  42.81 ml/m   AORTA Ao Asc diam: 3.20 cm TRICUSPID VALVE TR Peak grad:   30.5 mmHg TR Vmax:        276.00 cm/s Riley Lam MD Electronically signed by Riley Lam MD Signature Date/Time: 05/19/2023/1:41:06 PM    Final       LOS: 1 day   Jeoffrey Massed,  MD  [facility]    To contact the attending provider between 7A-7P or the covering provider during after hours 7P-7A, please log into the web site [email or web address] and access using universal [place] password for that web site. If you do not have the password, please call the hospital operator.  05/20/2023, 10:30 AM

## 2023-05-21 ENCOUNTER — Other Ambulatory Visit (HOSPITAL_COMMUNITY): Payer: Self-pay

## 2023-05-21 DIAGNOSIS — J9622 Acute and chronic respiratory failure with hypercapnia: Secondary | ICD-10-CM | POA: Diagnosis not present

## 2023-05-21 DIAGNOSIS — J9621 Acute and chronic respiratory failure with hypoxia: Secondary | ICD-10-CM | POA: Diagnosis not present

## 2023-05-21 DIAGNOSIS — I5022 Chronic systolic (congestive) heart failure: Secondary | ICD-10-CM | POA: Diagnosis not present

## 2023-05-21 DIAGNOSIS — I2510 Atherosclerotic heart disease of native coronary artery without angina pectoris: Secondary | ICD-10-CM | POA: Diagnosis not present

## 2023-05-21 LAB — CBC
HCT: 41.1 % (ref 39.0–52.0)
Hemoglobin: 12.9 g/dL — ABNORMAL LOW (ref 13.0–17.0)
MCH: 31.1 pg (ref 26.0–34.0)
MCHC: 31.4 g/dL (ref 30.0–36.0)
MCV: 99 fL (ref 80.0–100.0)
Platelets: 289 10*3/uL (ref 150–400)
RBC: 4.15 MIL/uL — ABNORMAL LOW (ref 4.22–5.81)
RDW: 14 % (ref 11.5–15.5)
WBC: 13.2 10*3/uL — ABNORMAL HIGH (ref 4.0–10.5)
nRBC: 0 % (ref 0.0–0.2)

## 2023-05-21 LAB — BASIC METABOLIC PANEL WITH GFR
Anion gap: 10 (ref 5–15)
BUN: 32 mg/dL — ABNORMAL HIGH (ref 8–23)
CO2: 35 mmol/L — ABNORMAL HIGH (ref 22–32)
Calcium: 8.8 mg/dL — ABNORMAL LOW (ref 8.9–10.3)
Chloride: 90 mmol/L — ABNORMAL LOW (ref 98–111)
Creatinine, Ser: 0.89 mg/dL (ref 0.61–1.24)
GFR, Estimated: >60 mL/min (ref >60)
Glucose, Bld: 147 mg/dL — ABNORMAL HIGH (ref 70–99)
Potassium: 4.4 mmol/L (ref 3.5–5.1)
Sodium: 135 mmol/L (ref 135–145)

## 2023-05-21 MED ORDER — PREDNISONE 10 MG PO TABS
ORAL_TABLET | ORAL | 0 refills | Status: DC
  Filled 2023-05-21: qty 10, 4d supply, fill #0

## 2023-05-21 NOTE — TOC Transition Note (Signed)
 Transition of Care [facility]) - Discharge Note   Patient Details  Name: Johnny Rodriguez MRN: [id number] Date of Birth: 07-Apr-1950  [facility] CM/SW Contact:  Gordy Clement, RN Phone Number: 05/21/2023, 9:26 AM   Clinical Narrative  Patient will DC to home today Home Health arranged and will be provided by [facility]. DME declined- has at home. AVS updated  Friend will transport home   Patient has portable O2 in room.            Patient Goals and CMS Choice            Discharge Placement                       Discharge Plan and Services Additional resources added to the After Visit Summary for                                       Social Drivers of Health (SDOH) Interventions SDOH Screenings   Food Insecurity: No Food Insecurity (05/18/2023)  Housing: Low Risk  (05/18/2023)  Transportation Needs: Unmet Transportation Needs (05/18/2023)  Utilities: Not At Risk (05/18/2023)  Alcohol Screen: Low Risk  (09/11/2020)  Recent Concern: Alcohol Screen - Medium Risk (08/07/2020)  Depression (PHQ2-9): Low Risk  (11/21/2022)  Financial Resource Strain: Low Risk  (11/21/2022)  Physical Activity: Inactive (11/21/2022)  Social Connections: Socially Isolated (05/18/2023)  Stress: No Stress Concern Present (11/21/2022)  Tobacco Use: High Risk (05/18/2023)  Health Literacy: Adequate Health Literacy (11/21/2022)     Readmission Risk Interventions     No data to display

## 2023-05-21 NOTE — Progress Notes (Signed)
 PT Cancellation Note  Patient Details Name: Johnny Rodriguez MRN: [id number] DOB: Jul 27, 1950   Cancelled Treatment:    Reason Eval/Treat Not Completed: Patient declined, no reason specified  Politely declines physical therapy services today, states he feels much better, is leaving shortly. Requests briefs x2, and shirt to d/c in. RN notified via secure chat.  Kathlyn Sacramento, PT, DPT [facility]  Rehabilitation Services Physical Therapist Office: [phone] Website: [email or web address]   Berton Mount 05/21/2023, 9:34 AM

## 2023-05-21 NOTE — Progress Notes (Signed)
 Brief note Patient feels much better-claims he is back to his baseline-on 3 L of oxygen.  He does not want to remain hospitalized any longer (similar issue last admission)-and is adamant on going home today.  He claims that most of his symptoms for this hospitalization is due to him smoking-and is determined not to smoke any longer.  He is being discharged home at his own request-see discharge summary for further details.

## 2023-05-21 NOTE — Discharge Summary (Signed)
 PATIENT DETAILS Name: Johnny Rodriguez Age: 73 y.o. Sex: male Date of Birth: [birth year] MRN: [id number]. Admitting Physician: Gertha Calkin, MD WUJ:[id number], Salomon Fick, NP  Admit Date: 05/18/2023 Discharge date: 05/21/2023  Recommendations for Outpatient Follow-up:  Follow up with PCP in 1-2 weeks Please obtain CMP/CBC in one week  Admitted From:  Home  Disposition: Home   Discharge Condition: good  CODE STATUS:   Code Status: Full Code   Diet recommendation:  Diet Order             Diet - low sodium heart healthy           Diet Heart Room service appropriate? Yes; Fluid consistency: Thin  Diet effective now                    Brief Summary: Patient is a 73 y.o.  male with history of COPD on 3 L of oxygen at home, HFrEF, PAF, HTN-who was just hospitalized for  acute on chronic hypoxic respiratory failure secondary to COPD exacerbation due to haemophilus influenza infection-discharged on 4/5 at his own request-presented back to the hospital with worsening shortness of breath-found to have acute on chronic hypoxic respiratory failure required BiPAP on initial presentation and subsequently admitted to the hospitalist service.    Significant events: 3/26>> presented with shortness of breath-intubated-admit to ICU 3/28>> extubated 3/30>> transferred to [facility] 4/05>> discharged home 4/07>> back to the ED with worsening shortness of breath-BiPAP-admit to [facility].   Significant studies: 3/20>> echo: EF 40-45% 3/26>> CXR: COPD-no obvious infiltrates. 4/02>> CTA chest: Right lower lobe PE 4/07>> CXR: Emphysema/small left pleural effusion.   Significant microbiology data: 3/26>> COVID/influenza/RSV PCR: Negative 3/26>> respiratory virus panel: Negative 3/26>> tracheal aspirate: Haemophilus influenza A 4/07>> COVID/influenza/RSV PCR: Negative 4/07>> Resp virus panel: None great.   Procedures: 3/26-3/28 >>ETT   Consults: None  Brief Hospital Course: Acute on chronic  respiratory failure secondary to COPD exacerbation Improved-treated with steroids/bronchodilators/empiric doxycycline-with significant clinical improvement-he is back down to his usual home regimen of 3 L of oxygen-lungs are clear on auscultation.  He is adamant on being discharged today as he feels he is back to his baseline. Continue usual 3 L of oxygen on discharge Tapering steroids Continue bronchodilators and follow-up with his PCP Counseled extensively regarding importance of stopping any further tobacco use-he claims that this current exacerbation is due to the fact that he went home from his most recent discharge and smoked that triggered his COPD.  He claims that he is motivated to stop further alcohol use.   Chronic atrial fibrillation Rate controlled Continue Eliquis   Chronic HFrEF Euvolemic As needed Lasix.   Known history of PE Small PE seen on recent CTA chest-not sure if this is a residual PE from prior PE. Suspect this is a incidental finding and not playing a major role in his symptomatology. Already on anticoagulation   Chronic pain syndrome Neurontin   Hyperkalemia Unclear etiology-resolved after a dose of Lokelma.   Leukocytosis Likely due to steroids-no indication of infection Follow CBC   Debility/deconditioning PT/OT-Home health recommended   History of tobacco use Unfortunately-continues to smoke-acknowledges smoking when he left the hospital Has been Shoulder extensively.   Nutrition Status: Nutrition Problem: Moderate Malnutrition Etiology: chronic illness Signs/Symptoms: severe muscle depletion, moderate fat depletion, moderate muscle depletion, percent weight loss Percent weight loss: 16 % Interventions: MVI, Magic cup, Snacks, Ensure Enlive (each supplement provides 350kcal and 20 grams of protein)   BMI: Estimated body  mass index is 22.32 kg/m as calculated from the following:   Height as of this encounter: 5\' 11"  (1.803 m).   Weight as  of this encounter: 72.6 kg.   Discharge Diagnoses:  Principal Problem:   Respiratory distress Active Problems:   CAD (coronary artery disease)   Paroxysmal atrial fibrillation (HCC)   Chronic systolic heart failure (HCC)   Essential hypertension   Acute on chronic respiratory failure with hypoxia and hypercapnia (HCC)   Cigarette smoker   Pulmonary embolism (HCC)   Malnutrition of moderate degree   Discharge Instructions:  Activity:  As tolerated with Full fall precautions use walker/cane & assistance as needed   Discharge Instructions     Call MD for:  difficulty breathing, headache or visual disturbances   Complete by: As directed    Call MD for:  extreme fatigue   Complete by: As directed    Call MD for:  persistant dizziness or light-headedness   Complete by: As directed    Diet - low sodium heart healthy   Complete by: As directed    Discharge instructions   Complete by: As directed    Follow with Primary MD  Rema Fendt, NP in 1-2 weeks  Please get a complete blood count and chemistry panel checked by your Primary MD at your next visit, and again as instructed by your Primary MD.  Get Medicines reviewed and adjusted: Please take all your medications with you for your next visit with your Primary MD  Laboratory/radiological data: Please request your Primary MD to go over all hospital tests and procedure/radiological results at the follow up, please ask your Primary MD to get all Hospital records sent to his/her office.  In some cases, they will be blood work, cultures and biopsy results pending at the time of your discharge. Please request that your primary care M.D. follows up on these results.  Also Note the following: If you experience worsening of your admission symptoms, develop shortness of breath, life threatening emergency, suicidal or homicidal thoughts you must seek medical attention immediately by calling 911 or calling your MD immediately  if  symptoms less severe.  You must read complete instructions/literature along with all the possible adverse reactions/side effects for all the Medicines you take and that have been prescribed to you. Take any new Medicines after you have completely understood and accpet all the possible adverse reactions/side effects.   Do not drive when taking Pain medications or sleeping medications (Benzodaizepines)  Do not take more than prescribed Pain, Sleep and Anxiety Medications. It is not advisable to combine anxiety,sleep and pain medications without talking with your primary care practitioner  Special Instructions: If you have smoked or chewed Tobacco  in the last 2 yrs please stop smoking, stop any regular Alcohol  and or any Recreational drug use.  Wear Seat belts while driving.  Please note: You were cared for by a hospitalist during your hospital stay. Once you are discharged, your primary care physician will handle any further medical issues. Please note that NO REFILLS for any discharge medications will be authorized once you are discharged, as it is imperative that you return to your primary care physician (or establish a relationship with a primary care physician if you do not have one) for your post hospital discharge needs so that they can reassess your need for medications and monitor your lab values.   Increase activity slowly   Complete by: As directed       Allergies  as of 05/21/2023   No Known Allergies      Medication List     TAKE these medications    albuterol (2.5 MG/3ML) 0.083% nebulizer solution Commonly known as: PROVENTIL Take 3 mLs (2.5 mg total) by nebulization every 4 (four) hours as needed for wheezing or shortness of breath.   Ventolin HFA 108 (90 Base) MCG/ACT inhaler Generic drug: albuterol Inhale 1-2 puffs into the lungs every 6 (six) hours as needed for wheezing or shortness of breath   aspirin EC 81 MG tablet Take 1 tablet (81 mg total) by mouth daily.  Swallow whole.   Breo Ellipta 100-25 MCG/ACT Aepb Generic drug: fluticasone furoate-vilanterol Inhale 1 puff into the lungs daily.   Eliquis 5 MG Tabs tablet Generic drug: apixaban Take 1 tablet (5 mg total) by mouth 2 (two) times daily.   folic acid 1 MG tablet Commonly known as: FOLVITE Take 1 tablet (1 mg total) by mouth daily.   gabapentin 400 MG capsule Commonly known as: NEURONTIN Take 1 capsule (400 mg total) by mouth daily as needed (for nerve pain).   linaclotide 145 MCG Caps capsule Commonly known as: Linzess Take 1 capsule (145 mcg total) by mouth daily before breakfast.   metoprolol succinate 50 MG 24 hr tablet Commonly known as: TOPROL-XL Take 1 tablet (50 mg total) by mouth daily. Take with or immediately following a meal.   OXYGEN Inhale 3 L into the lungs continuous.   pantoprazole 40 MG tablet Commonly known as: PROTONIX Take 1 tablet (40 mg total) by mouth daily as needed (acid reflux).   polyethylene glycol 17 g packet Commonly known as: MIRALAX / GLYCOLAX Take 17 g by mouth daily. What changed:  when to take this reasons to take this   predniSONE 10 MG tablet Commonly known as: DELTASONE Take 40 mg daily for 1 day, 30 mg daily for 1 day, 20 mg daily for 1 days,10 mg daily for 1 day, then stop   senna 8.6 MG Tabs tablet Commonly known as: SENOKOT Take 1-2 tablets (8.6-17.2 mg total) by mouth daily. What changed:  when to take this reasons to take this   Spiriva Respimat 2.5 MCG/ACT Aers Generic drug: Tiotropium Bromide Monohydrate Inhale 2 puffs into the lungs daily.   traZODone 50 MG tablet Commonly known as: DESYREL Take 1 tablet (50 mg total) by mouth at bedtime as needed for sleep. What changed: when to take this        Follow-up Information     [facility] Follow up.   Why: [facility] will contact you within 48 hours of discharge to arrange your home health visit Contact information: [address]  [place], [place]  [address]  [phone]        Rema Fendt, NP. Schedule an appointment as soon as possible for a visit in 1 week(s).   Specialty: Nurse Practitioner Contact information: [address] [place] [address] [phone]                No Known Allergies   Other Procedures/Studies: ECHOCARDIOGRAM LIMITED BUBBLE STUDY Result Date: 05/19/2023    ECHOCARDIOGRAM LIMITED REPORT   Patient Name:   RAYSEAN GRAUMANN Dorsey Date of Exam: 05/19/2023 Medical Rec #:  [id number]          Height:       71.0 in Accession #:    [id number]         Weight:       160.0 lb  Date of Birth:  [birth year]          BSA:          1.918 m Patient Age:    38 years           BP:           112/66 mmHg Patient Gender: M                  HR:           92 bpm. Exam Location:  Inpatient Procedure: Limited Echo, Cardiac Doppler, Color Doppler, Intracardiac            Opacification Agent and Saline Contrast Bubble Study (Both Spectral            and Color Flow Doppler were utilized during procedure). Indications:    I50.40* Unspecified combined systolic (congestive) and diastolic                 (congestive) heart failure  History:        Patient has prior history of Echocardiogram examinations, most                 recent 04/30/2023. Cardiomyopathy and CHF, CAD, Abnormal ECG,                 Arrythmias:Atrial Fibrillation, Signs/Symptoms:Dyspnea,                 Shortness of Breath and Syncope; Risk Factors:Current Smoker.                 Pulmonary embolus.  Sonographer:    Sheralyn Boatman RDCS Referring Phys: [phone] EKTA V PATEL IMPRESSIONS  1. Left ventricular ejection fraction, by estimation, is 35 to 40%. The left ventricle has moderately decreased function. The left ventricle demonstrates global hypokinesis. There is mild concentric left ventricular hypertrophy. Left ventricular diastolic parameters are indeterminate.  2. The right ventricular size is mildly enlarged. There is mildly elevated pulmonary artery systolic  pressure. The estimated right ventricular systolic pressure is 38.5 mmHg.  3. Left atrial size was moderately dilated.  4. A small pericardial effusion is present. The pericardial effusion is anterior to the right ventricle. There is no evidence of cardiac tamponade.  5. The mitral valve is normal in structure. No evidence of mitral valve regurgitation. No evidence of mitral stenosis.  6. Aortic valve regurgitation is not visualized.  7. The inferior vena cava is dilated in size with >50% respiratory variability, suggesting right atrial pressure of 8 mmHg. Comparison(s): No significant change from prior study. Prior images reviewed side by side. FINDINGS  Left Ventricle: On non-contrast images concern for left ventricular apical thrombus. This does not appear on contrast images. Left ventricular ejection fraction, by estimation, is 35 to 40%. The left ventricle has moderately decreased function. The left  ventricle demonstrates global hypokinesis. Definity contrast agent was given IV to delineate the left ventricular endocardial borders. The left ventricular internal cavity size was normal in size. There is mild concentric left ventricular hypertrophy. Left ventricular diastolic parameters are indeterminate. Right Ventricle: The right ventricular size is mildly enlarged. There is mildly elevated pulmonary artery systolic pressure. The tricuspid regurgitant velocity is 2.76 m/s, and with an assumed right atrial pressure of 8 mmHg, the estimated right ventricular systolic pressure is 38.5 mmHg. Left Atrium: Left atrial size was moderately dilated. Pericardium: A small pericardial effusion is present. The pericardial effusion is anterior to the right ventricle. There is no evidence of cardiac tamponade.  Mitral Valve: The mitral valve is normal in structure. No evidence of mitral valve stenosis. Tricuspid Valve: The tricuspid valve is normal in structure. Tricuspid valve regurgitation is mild . No evidence of tricuspid  stenosis. Aortic Valve: Aortic valve regurgitation is not visualized. Aorta: The aortic root and ascending aorta are structurally normal, with no evidence of dilitation. Venous: The inferior vena cava is dilated in size with greater than 50% respiratory variability, suggesting right atrial pressure of 8 mmHg. IAS/Shunts: Agitated saline contrast was given intravenously to evaluate for intracardiac shunting. Additional Comments: Spectral Doppler performed. Color Doppler performed.  LEFT VENTRICLE PLAX 2D LVIDd:         4.70 cm LVIDs:         4.20 cm LV PW:         1.20 cm LV IVS:        1.10 cm  LV Volumes (MOD) LV vol d, MOD A2C: 141.0 ml LV vol d, MOD A4C: 139.0 ml LV vol s, MOD A2C: 89.3 ml LV vol s, MOD A4C: 82.7 ml LV SV MOD A2C:     51.7 ml LV SV MOD A4C:     139.0 ml LV SV MOD BP:      54.9 ml RIGHT VENTRICLE         IVC TAPSE (M-mode): 2.2 cm  IVC diam: 2.20 cm RIGHT ATRIUM           Index RA Area:     25.10 cm RA Volume:   82.10 ml  42.81 ml/m   AORTA Ao Asc diam: 3.20 cm TRICUSPID VALVE TR Peak grad:   30.5 mmHg TR Vmax:        276.00 cm/s Riley Lam MD Electronically signed by Riley Lam MD Signature Date/Time: 05/19/2023/1:41:06 PM    Final    DG Chest Portable 1 View Result Date: 05/18/2023 CLINICAL DATA:  73 year old male with shortness of breath, respiratory distress. EXAM: PORTABLE CHEST 1 VIEW COMPARISON:  Portable chest 05/12/2023.  CTA chest 05/13/2023. FINDINGS: Portable AP views at 0700 hours. Hyperinflated lungs. Centrilobular emphysema and small layering left pleural effusion on the recent CTA. Lung volumes and ventilation stable since 05/12/2023. No pneumothorax, pulmonary edema, new lung opacity. Normal cardiac size and mediastinal contours. Visualized tracheal air column is within normal limits. Stable visualized osseous structures. Negative visible bowel gas. IMPRESSION: Emphysema (ICD10-J43.9) and small left pleural effusion stable from recent CTA. No new  cardiopulmonary abnormality. Electronically Signed   By: Odessa Fleming M.D.   On: 05/18/2023 07:08   CT Angio Chest Pulmonary Embolism (PE) W or WO Contrast Result Date: 05/13/2023 CLINICAL DATA:  Hypoxia EXAM: CT ANGIOGRAPHY CHEST WITH CONTRAST TECHNIQUE: Multidetector CT imaging of the chest was performed using the standard protocol during bolus administration of intravenous contrast. Multiplanar CT image reconstructions and MIPs were obtained to evaluate the vascular anatomy. RADIATION DOSE REDUCTION: This exam was performed according to the departmental dose-optimization program which includes automated exposure control, adjustment of the mA and/or kV according to patient size and/or use of iterative reconstruction technique. CONTRAST:  75mL OMNIPAQUE IOHEXOL 350 MG/ML SOLN COMPARISON:  CTA 03/27/2022. FINDINGS: Cardiovascular: Heart is enlarged. Small pericardial effusion, increased from prior CTA. Coronary artery calcifications are seen. The thoracic aorta has a normal course and caliber with some atherosclerotic partially calcified plaque. Some enlargement of the main pulmonary arteries. Please correlate for evidence of pulmonary hypertension. Also some subtle filling defect identified in the right lower lobe central pulmonary artery and some peripheral areas  along the posteromedial right lower lobe. Subtle pulmonary emboli. Some of the changes could be mixing artifact as well. Distribution is similar to previous but appears more so than on the prior. Right IJ line in place. Mediastinum/Nodes: Preserved thyroid gland. Slightly patulous thoracic esophagus. No specific abnormal lymph node enlargement present in the axillary region, hilum or mediastinum. Lungs/Pleura: Small left and tiny right pleural effusions are identified. Diffuse peripheral interstitial septal thickening identified with some nodular contour to the thickening. Tree-in-bud like changes. There is significant areas of mucous plugging identified in  the right lower lobe. Subtle areas of atelectasis seen in the lingula dependently. Additional underlying centrilobular emphysematous changes. Upper Abdomen: Stable nodular thickening of the adrenal glands, left greater than right. Musculoskeletal: Curvature of the spine with scattered degenerative changes. Stable mild compression deformity at of T6. multiple healed left-sided rib fractures. Review of the MIP images confirms the above findings. Critical Value/emergent results were called by telephone at the time of interpretation on 05/13/2023 at 10:59 am to provider [facility] , who verbally acknowledged these results. IMPRESSION: Small area of filling defect along the right lower lobe consistent with pulmonary emboli. Some of this could be mixing artifact as well. Minimal clot burden. Similar distribution to prior embolus but different configuration and slightly more than prior. Enlarged heart with small pericardial effusion. Coronary artery calcifications. Enlargement of the pulmonary arteries. Please correlate for any evidence of pulmonary hypertension. Emphysematous lung changes. Persistent areas of interstitial septal thickening with tree-in-bud like changes. Aortic Atherosclerosis (ICD10-I70.0) and Emphysema (ICD10-J43.9). Electronically Signed   By: Karen Kays M.D.   On: 05/13/2023 11:08   DG Chest Port 1 View Result Date: 05/12/2023 CLINICAL DATA:  [id number] Acute respiratory distress [id number] EXAM: PORTABLE CHEST 1 VIEW COMPARISON:  Chest x-ray 05/08/2023 trauma CT angio chest 01/25/2023 FINDINGS: Right chest wall likely subclavian venous catheter with tip overlying the distal superior vena cava. The heart and mediastinal contours are unchanged. Atherosclerotic plaque. Hyperinflation of the lungs. No focal consolidation. Chronic coarsened interstitial markings with no overt pulmonary edema. Blunting of bilateral costophrenic angles with possible bilateral trace pleural effusions. No pneumothorax. No acute  osseous abnormality. IMPRESSION: 1. Blunting of bilateral costophrenic angles with possible bilateral trace pleural effusions. 2. Aortic Atherosclerosis (ICD10-I70.0) and Emphysema (ICD10-J43.9). Electronically Signed   By: Tish Frederickson M.D.   On: 05/12/2023 20:59   DG Chest Port 1 View Result Date: 05/08/2023 CLINICAL DATA:  COPD exacerbation. EXAM: PORTABLE CHEST 1 VIEW COMPARISON:  Radiographs 05/06/2023 and 12/05/2022.  CT 03/27/2022. FINDINGS: 1009 hours. Tip of the endotracheal tube is unchanged, proximally 4.6 cm above the carina. Right subclavian central venous catheter and enteric tubes are unchanged in position. The heart size and mediastinal contours are stable with aortic atherosclerosis. Blunting of the left costophrenic angle is noted, incompletely visualized on the most recent prior study, but appearing progressive from older comparison examinations. There is no edema, confluent airspace disease or pneumothorax. The bones appear unchanged. IMPRESSION: 1. Stable support system. 2. Possible enlarging left pleural effusion. No focal airspace disease. Electronically Signed   By: Carey Bullocks M.D.   On: 05/08/2023 11:06   US Abdomen Limited RUQ (LIVER/GB) Result Date: 05/06/2023 CLINICAL DATA:  Elevated liver enzymes EXAM: ULTRASOUND ABDOMEN LIMITED RIGHT UPPER QUADRANT COMPARISON:  CT abdomen and pelvis 11/18/2022 FINDINGS: Gallbladder: No gallstones or wall thickening visualized. No sonographic Murphy sign noted by sonographer. Common bile duct: Diameter: 4 mm, normal Liver: No focal lesion identified. Within normal limits in parenchymal echogenicity.  Portal vein is patent on color Doppler imaging with normal direction of blood flow towards the liver. Other: Trace free fluid around the liver edge. IMPRESSION: Trace free fluid around the liver edge. No evidence of cholelithiasis or acute cholecystitis. Electronically Signed   By: Burman Nieves M.D.   On: 05/06/2023 20:42   US  RENAL Result Date: 05/06/2023 CLINICAL DATA:  Given history is to confirm NG tube placement. EXAM: RENAL / URINARY TRACT ULTRASOUND COMPLETE COMPARISON:  CT abdomen and pelvis 11/18/2022 FINDINGS: Right Kidney: Renal measurements: 10.6 x 5.2 x 5.5 cm = volume: 158 mL. Echogenicity within normal limits. No mass or hydronephrosis visualized. Left Kidney: Renal measurements: 10 x 5.1 x 3.7 cm = volume: 98 mL. Echogenicity within normal limits. No mass or hydronephrosis visualized. Bladder: Foley catheter with retention balloon in the bladder. Bladder remains urine filled without additional filling defect. No bladder wall thickening. Other: Unable to evaluate nasogastric tube on renal ultrasound. IMPRESSION: 1. Normal ultrasound appearance of the kidneys.  No hydronephrosis. 2. Foley catheter in the bladder. Electronically Signed   By: Burman Nieves M.D.   On: 05/06/2023 20:40   DG Chest Portable 1 View Result Date: 05/06/2023 CLINICAL DATA:  et place EXAM: PORTABLE CHEST 1 VIEW COMPARISON:  Chest x-ray 12/05/2022, CT angio chest 01/25/2023 FINDINGS: Right subclavian central venous catheter with tip overlying the expected region of the superior cavoatrial junction. Endotracheal tube terminates 6 cm above the carina. Enteric tube courses below the hemidiaphragm with tip and side port overlying the expected region of the gastric lumen. Partially visualized temperature probe overlies the pelvis. The heart and mediastinal contours are unchanged. Atherosclerotic plaque. No focal consolidation. No pulmonary edema. Hyperinflation of the hemidiaphragms with flattening of the hemi thoraces. No pneumothorax. No acute osseous abnormality. IMPRESSION: 1. Lines and tubes in good position. 2. No active disease. 3. Nonobstructive bowel gas pattern. 4. Aortic Atherosclerosis (ICD10-I70.0) and Emphysema (ICD10-J43.9). Electronically Signed   By: Tish Frederickson M.D.   On: 05/06/2023 17:23   DG Abd Portable 1 View Result Date:  05/06/2023 CLINICAL DATA:  et place EXAM: PORTABLE CHEST 1 VIEW COMPARISON:  Chest x-ray 12/05/2022, CT angio chest 01/25/2023 FINDINGS: Right subclavian central venous catheter with tip overlying the expected region of the superior cavoatrial junction. Endotracheal tube terminates 6 cm above the carina. Enteric tube courses below the hemidiaphragm with tip and side port overlying the expected region of the gastric lumen. Partially visualized temperature probe overlies the pelvis. The heart and mediastinal contours are unchanged. Atherosclerotic plaque. No focal consolidation. No pulmonary edema. Hyperinflation of the hemidiaphragms with flattening of the hemi thoraces. No pneumothorax. No acute osseous abnormality. IMPRESSION: 1. Lines and tubes in good position. 2. No active disease. 3. Nonobstructive bowel gas pattern. 4. Aortic Atherosclerosis (ICD10-I70.0) and Emphysema (ICD10-J43.9). Electronically Signed   By: Tish Frederickson M.D.   On: 05/06/2023 17:23   DG Chest Portable 1 View Result Date: 05/06/2023 CLINICAL DATA:  Hypoxia. EXAM: PORTABLE CHEST 1 VIEW COMPARISON:  December 05, 2022 FINDINGS: The heart size and mediastinal contours are within normal limits. There is mild to moderate severity calcification of the aortic arch. Lungs are hyperinflated. Chronic bronchitic changes are again seen. There is no evidence of acute infiltrate, pleural effusion or pneumothorax. The visualized skeletal structures are unremarkable. IMPRESSION: COPD without acute or active cardiopulmonary disease. Electronically Signed   By: Aram Candela M.D.   On: 05/06/2023 15:35   ECHOCARDIOGRAM COMPLETE Result Date: 04/30/2023    ECHOCARDIOGRAM REPORT  Patient Name:   JATINDER MCDONAGH Minich Date of Exam: 04/30/2023 Medical Rec #:  [id number]          Height:       71.0 in Accession #:    [id number]         Weight:       168.0 lb Date of Birth:  Aug 09, 1950          BSA:          1.958 m Patient Age:    73 years           BP:            107/65 mmHg Patient Gender: M                  HR:           112 bpm. Exam Location:  [address] Procedure: 2D Echo, Cardiac Doppler and Color Doppler (Both Spectral and Color            Flow Doppler were utilized during procedure). Indications:    R06.02 Shortness of breath  History:        Patient has prior history of Echocardiogram examinations, most                 recent 06/16/2019. Cardiomyopathy, CAD, COPD, Arrythmias:Atrial                 Fibrillation; Risk Factors:Hypertension and Current Smoker.  Sonographer:    Daphine Deutscher RDCS Referring Phys: [id number] Luetta Nutting OLLIS IMPRESSIONS  1. Left ventricular ejection fraction, by estimation, is 40 to 45%. The left ventricle has mildly decreased function. The left ventricle demonstrates global hypokinesis. Left ventricular diastolic parameters are indeterminate.  2. Right ventricular systolic function is normal. The right ventricular size is normal. There is mildly elevated pulmonary artery systolic pressure.  3. Left atrial size was mild to moderately dilated.  4. Right atrial size was moderately dilated.  5. The mitral valve is normal in structure. Trivial mitral valve regurgitation. No evidence of mitral stenosis.  6. The aortic valve is tricuspid. Aortic valve regurgitation is not visualized. No aortic stenosis is present.  7. The inferior vena cava is normal in size with greater than 50% respiratory variability, suggesting right atrial pressure of 3 mmHg. FINDINGS  Left Ventricle: Left ventricular ejection fraction, by estimation, is 40 to 45%. The left ventricle has mildly decreased function. The left ventricle demonstrates global hypokinesis. The left ventricular internal cavity size was normal in size. There is  no left ventricular hypertrophy. Left ventricular diastolic function could not be evaluated due to atrial fibrillation. Left ventricular diastolic parameters are indeterminate. Right Ventricle: The right ventricular size is normal.  No increase in right ventricular wall thickness. Right ventricular systolic function is normal. There is mildly elevated pulmonary artery systolic pressure. The tricuspid regurgitant velocity is 3.23  m/s, and with an assumed right atrial pressure of 3 mmHg, the estimated right ventricular systolic pressure is 44.7 mmHg. Left Atrium: Left atrial size was mild to moderately dilated. Right Atrium: Right atrial size was moderately dilated. Pericardium: There is no evidence of pericardial effusion. Mitral Valve: The mitral valve is normal in structure. Trivial mitral valve regurgitation. No evidence of mitral valve stenosis. Tricuspid Valve: The tricuspid valve is normal in structure. Tricuspid valve regurgitation is mild . No evidence of tricuspid stenosis. Aortic Valve: The aortic valve is tricuspid. Aortic valve regurgitation is not visualized. No aortic stenosis is present. Pulmonic Valve: The  pulmonic valve was normal in structure. Pulmonic valve regurgitation is not visualized. No evidence of pulmonic stenosis. Aorta: The aortic root is normal in size and structure. Venous: The inferior vena cava is normal in size with greater than 50% respiratory variability, suggesting right atrial pressure of 3 mmHg. IAS/Shunts: No atrial level shunt detected by color flow Doppler.  LEFT VENTRICLE PLAX 2D LVIDd:         5.10 cm LVIDs:         3.90 cm LV PW:         0.70 cm LV IVS:        0.70 cm LVOT diam:     2.20 cm LV SV:         45 LV SV Index:   23 LVOT Area:     3.80 cm  RIGHT VENTRICLE             IVC RV Basal diam:  3.90 cm     IVC diam: 1.90 cm RV S prime:     10.55 cm/s TAPSE (M-mode): 1.6 cm LEFT ATRIUM             Index        RIGHT ATRIUM           Index LA diam:        4.60 cm 2.35 cm/m   RA Area:     21.00 cm LA Vol (A2C):   79.5 ml 40.60 ml/m  RA Volume:   68.20 ml  34.83 ml/m LA Vol (A4C):   48.8 ml 24.92 ml/m LA Biplane Vol: 65.4 ml 33.40 ml/m  AORTIC VALVE LVOT Vmax:   82.40 cm/s LVOT Vmean:  54.500  cm/s LVOT VTI:    0.117 m  AORTA Ao Root diam: 3.50 cm Ao Asc diam:  3.60 cm MV E velocity: 98.52 cm/s  TRICUSPID VALVE                            TR Peak grad:   41.7 mmHg                            TR Vmax:        323.00 cm/s                             SHUNTS                            Systemic VTI:  0.12 m                            Systemic Diam: 2.20 cm Chilton Si MD Electronically signed by Chilton Si MD Signature Date/Time: 04/30/2023/8:42:18 PM    Final      TODAY-DAY OF DISCHARGE:  Subjective:   Lois Huxley today has no headache,no chest abdominal pain,no new weakness tingling or numbness, feels much better wants to go home today.   Objective:   Blood pressure 115/71, pulse 96, temperature 97.6 F (36.4 C), temperature source Oral, resp. rate 16, height 5\' 11"  (1.803 m), weight 72.6 kg, SpO2 97%.  Intake/Output Summary (Last 24 hours) at 05/21/2023 0922 Last data filed at 05/20/2023 2321 Gross per 24 hour  Intake 540 ml  Output 1500 ml  Net -960 ml  Filed Weights   05/18/23 0648  Weight: 72.6 kg    Exam: Awake Alert, Oriented *3, No new F.N deficits, Normal affect Magna.AT,PERRAL Supple Neck,No JVD, No cervical lymphadenopathy appriciated.  Symmetrical Chest wall movement, Good air movement bilaterally, CTAB RRR,No Gallops,Rubs or new Murmurs, No Parasternal Heave +ve B.Sounds, Abd Soft, Non tender, No organomegaly appriciated, No rebound -guarding or rigidity. No Cyanosis, Clubbing or edema, No new Rash or bruise   PERTINENT RADIOLOGIC STUDIES: ECHOCARDIOGRAM LIMITED BUBBLE STUDY Result Date: 05/19/2023    ECHOCARDIOGRAM LIMITED REPORT   Patient Name:   Johnny Rodriguez Date of Exam: 05/19/2023 Medical Rec #:  [id number]          Height:       71.0 in Accession #:    [id number]         Weight:       160.0 lb Date of Birth:  [birth year]          BSA:          1.918 m Patient Age:    73 years           BP:           112/66 mmHg Patient Gender: M                  HR:            92 bpm. Exam Location:  Inpatient Procedure: Limited Echo, Cardiac Doppler, Color Doppler, Intracardiac            Opacification Agent and Saline Contrast Bubble Study (Both Spectral            and Color Flow Doppler were utilized during procedure). Indications:    I50.40* Unspecified combined systolic (congestive) and diastolic                 (congestive) heart failure  History:        Patient has prior history of Echocardiogram examinations, most                 recent 04/30/2023. Cardiomyopathy and CHF, CAD, Abnormal ECG,                 Arrythmias:Atrial Fibrillation, Signs/Symptoms:Dyspnea,                 Shortness of Breath and Syncope; Risk Factors:Current Smoker.                 Pulmonary embolus.  Sonographer:    Sheralyn Boatman RDCS Referring Phys: [id number] EKTA V PATEL IMPRESSIONS  1. Left ventricular ejection fraction, by estimation, is 35 to 40%. The left ventricle has moderately decreased function. The left ventricle demonstrates global hypokinesis. There is mild concentric left ventricular hypertrophy. Left ventricular diastolic parameters are indeterminate.  2. The right ventricular size is mildly enlarged. There is mildly elevated pulmonary artery systolic pressure. The estimated right ventricular systolic pressure is 38.5 mmHg.  3. Left atrial size was moderately dilated.  4. A small pericardial effusion is present. The pericardial effusion is anterior to the right ventricle. There is no evidence of cardiac tamponade.  5. The mitral valve is normal in structure. No evidence of mitral valve regurgitation. No evidence of mitral stenosis.  6. Aortic valve regurgitation is not visualized.  7. The inferior vena cava is dilated in size with >50% respiratory variability, suggesting right atrial pressure of 8 mmHg. Comparison(s): No significant change from prior study. Prior images reviewed side by side. FINDINGS  Left  Ventricle: On non-contrast images concern for left ventricular apical thrombus. This  does not appear on contrast images. Left ventricular ejection fraction, by estimation, is 35 to 40%. The left ventricle has moderately decreased function. The left  ventricle demonstrates global hypokinesis. Definity contrast agent was given IV to delineate the left ventricular endocardial borders. The left ventricular internal cavity size was normal in size. There is mild concentric left ventricular hypertrophy. Left ventricular diastolic parameters are indeterminate. Right Ventricle: The right ventricular size is mildly enlarged. There is mildly elevated pulmonary artery systolic pressure. The tricuspid regurgitant velocity is 2.76 m/s, and with an assumed right atrial pressure of 8 mmHg, the estimated right ventricular systolic pressure is 38.5 mmHg. Left Atrium: Left atrial size was moderately dilated. Pericardium: A small pericardial effusion is present. The pericardial effusion is anterior to the right ventricle. There is no evidence of cardiac tamponade. Mitral Valve: The mitral valve is normal in structure. No evidence of mitral valve stenosis. Tricuspid Valve: The tricuspid valve is normal in structure. Tricuspid valve regurgitation is mild . No evidence of tricuspid stenosis. Aortic Valve: Aortic valve regurgitation is not visualized. Aorta: The aortic root and ascending aorta are structurally normal, with no evidence of dilitation. Venous: The inferior vena cava is dilated in size with greater than 50% respiratory variability, suggesting right atrial pressure of 8 mmHg. IAS/Shunts: Agitated saline contrast was given intravenously to evaluate for intracardiac shunting. Additional Comments: Spectral Doppler performed. Color Doppler performed.  LEFT VENTRICLE PLAX 2D LVIDd:         4.70 cm LVIDs:         4.20 cm LV PW:         1.20 cm LV IVS:        1.10 cm  LV Volumes (MOD) LV vol d, MOD A2C: 141.0 ml LV vol d, MOD A4C: 139.0 ml LV vol s, MOD A2C: 89.3 ml LV vol s, MOD A4C: 82.7 ml LV SV MOD A2C:     51.7  ml LV SV MOD A4C:     139.0 ml LV SV MOD BP:      54.9 ml RIGHT VENTRICLE         IVC TAPSE (M-mode): 2.2 cm  IVC diam: 2.20 cm RIGHT ATRIUM           Index RA Area:     25.10 cm RA Volume:   82.10 ml  42.81 ml/m   AORTA Ao Asc diam: 3.20 cm TRICUSPID VALVE TR Peak grad:   30.5 mmHg TR Vmax:        276.00 cm/s Riley Lam MD Electronically signed by Riley Lam MD Signature Date/Time: 05/19/2023/1:41:06 PM    Final      PERTINENT LAB RESULTS: CBC: Recent Labs    05/20/23 0427 05/21/23 0737  WBC 15.1* 13.2*  HGB 12.6* 12.9*  HCT 40.2 41.1  PLT 278 289   CMET CMP     Component Value Date/Time   NA 135 05/21/2023 0737   NA 147 (H) 08/15/2022 1420   NA 139 02/05/2014 2217   K 4.4 05/21/2023 0737   K 4.2 02/05/2014 2217   CL 90 (L) 05/21/2023 0737   CL 104 02/05/2014 2217   CO2 35 (H) 05/21/2023 0737   CO2 28 02/05/2014 2217   GLUCOSE 147 (H) 05/21/2023 0737   GLUCOSE 91 02/05/2014 2217   BUN 32 (H) 05/21/2023 0737   BUN 16 08/15/2022 1420   BUN 18 02/05/2014 2217   CREATININE 0.89 05/21/2023  1610   CREATININE 1.10 02/05/2014 2217   CALCIUM 8.8 (L) 05/21/2023 0737   CALCIUM 8.8 02/05/2014 2217   PROT 5.9 (L) 05/19/2023 0437   PROT 6.6 08/15/2022 1420   ALBUMIN 2.9 (L) 05/19/2023 0437   ALBUMIN 4.0 08/15/2022 1420   AST 20 05/19/2023 0437   ALT 45 (H) 05/19/2023 0437   ALKPHOS 45 05/19/2023 0437   BILITOT 0.7 05/19/2023 0437   BILITOT 0.4 08/15/2022 1420   GFR 65.71 12/10/2011 1155   EGFR 81 08/15/2022 1420   GFRNONAA >60 05/21/2023 0737   GFRNONAA >60 02/05/2014 2217    GFR Estimated Creatinine Clearance: 78.2 mL/min (by C-G formula based on SCr of 0.89 mg/dL). No results for input(s): "LIPASE", "AMYLASE" in the last 72 hours. No results for input(s): "CKTOTAL", "CKMB", "CKMBINDEX", "TROPONINI" in the last 72 hours. Invalid input(s): "POCBNP" No results for input(s): "DDIMER" in the last 72 hours. No results for input(s): "HGBA1C" in the last 72  hours. No results for input(s): "CHOL", "HDL", "LDLCALC", "TRIG", "CHOLHDL", "LDLDIRECT" in the last 72 hours. No results for input(s): "TSH", "T4TOTAL", "T3FREE", "THYROIDAB" in the last 72 hours.  Invalid input(s): "FREET3" No results for input(s): "VITAMINB12", "FOLATE", "FERRITIN", "TIBC", "IRON", "RETICCTPCT" in the last 72 hours. Coags: No results for input(s): "INR" in the last 72 hours.  Invalid input(s): "PT" Microbiology: Recent Results (from the past 240 hours)  Resp panel by RT-PCR (RSV, Flu A&B, Covid) Anterior Nasal Swab     Status: None   Collection Time: 05/18/23  6:52 AM   Specimen: Anterior Nasal Swab  Result Value Ref Range Status   SARS Coronavirus 2 by RT PCR NEGATIVE NEGATIVE Final   Influenza A by PCR NEGATIVE NEGATIVE Final   Influenza B by PCR NEGATIVE NEGATIVE Final    Comment: (NOTE) The Xpert Xpress SARS-CoV-2/FLU/RSV plus assay is intended as an aid in the diagnosis of influenza from Nasopharyngeal swab specimens and should not be used as a sole basis for treatment. Nasal washings and aspirates are unacceptable for Xpert Xpress SARS-CoV-2/FLU/RSV testing.  Fact Sheet for Patients: BloggerCourse.com  Fact Sheet for Healthcare Providers: SeriousBroker.it  This test is not yet approved or cleared by the [place] FDA and has been authorized for detection and/or diagnosis of SARS-CoV-2 by FDA under an Emergency Use Authorization (EUA). This EUA will remain in effect (meaning this test can be used) for the duration of the COVID-19 declaration under Section 564(b)(1) of the Act, 21 U.S.C. section 360bbb-3(b)(1), unless the authorization is terminated or revoked.     Resp Syncytial Virus by PCR NEGATIVE NEGATIVE Final    Comment: (NOTE) Fact Sheet for Patients: BloggerCourse.com  Fact Sheet for Healthcare Providers: SeriousBroker.it  This test is  not yet approved or cleared by the [place] FDA and has been authorized for detection and/or diagnosis of SARS-CoV-2 by FDA under an Emergency Use Authorization (EUA). This EUA will remain in effect (meaning this test can be used) for the duration of the COVID-19 declaration under Section 564(b)(1) of the Act, 21 U.S.C. section 360bbb-3(b)(1), unless the authorization is terminated or revoked.  Performed at [facility], [address], [place], [place] [address]   Respiratory (~20 pathogens) panel by PCR     Status: None   Collection Time: 05/18/23  2:43 PM   Specimen: Nasopharyngeal Swab; Respiratory  Result Value Ref Range Status   Adenovirus NOT DETECTED NOT DETECTED Final   Coronavirus 229E NOT DETECTED NOT DETECTED Final    Comment: (NOTE)  The Coronavirus on the Respiratory Panel, DOES NOT test for the novel  Coronavirus (2019 nCoV)    Coronavirus HKU1 NOT DETECTED NOT DETECTED Final   Coronavirus NL63 NOT DETECTED NOT DETECTED Final   Coronavirus OC43 NOT DETECTED NOT DETECTED Final   Metapneumovirus NOT DETECTED NOT DETECTED Final   Rhinovirus / Enterovirus NOT DETECTED NOT DETECTED Final   Influenza A NOT DETECTED NOT DETECTED Final   Influenza B NOT DETECTED NOT DETECTED Final   Parainfluenza Virus 1 NOT DETECTED NOT DETECTED Final   Parainfluenza Virus 2 NOT DETECTED NOT DETECTED Final   Parainfluenza Virus 3 NOT DETECTED NOT DETECTED Final   Parainfluenza Virus 4 NOT DETECTED NOT DETECTED Final   Respiratory Syncytial Virus NOT DETECTED NOT DETECTED Final   Bordetella pertussis NOT DETECTED NOT DETECTED Final   Bordetella Parapertussis NOT DETECTED NOT DETECTED Final   Chlamydophila pneumoniae NOT DETECTED NOT DETECTED Final   Mycoplasma pneumoniae NOT DETECTED NOT DETECTED Final    Comment: Performed at [facility], [address], [place], [place] [address]    FURTHER DISCHARGE INSTRUCTIONS:  Get Medicines reviewed and adjusted: Please take  all your medications with you for your next visit with your Primary MD  Laboratory/radiological data: Please request your Primary MD to go over all hospital tests and procedure/radiological results at the follow up, please ask your Primary MD to get all Hospital records sent to his/her office.  In some cases, they will be blood work, cultures and biopsy results pending at the time of your discharge. Please request that your primary care M.D. goes through all the records of your hospital data and follows up on these results.  Also Note the following: If you experience worsening of your admission symptoms, develop shortness of breath, life threatening emergency, suicidal or homicidal thoughts you must seek medical attention immediately by calling 911 or calling your MD immediately  if symptoms less severe.  You must read complete instructions/literature along with all the possible adverse reactions/side effects for all the Medicines you take and that have been prescribed to you. Take any new Medicines after you have completely understood and accpet all the possible adverse reactions/side effects.   Do not drive when taking Pain medications or sleeping medications (Benzodaizepines)  Do not take more than prescribed Pain, Sleep and Anxiety Medications. It is not advisable to combine anxiety,sleep and pain medications without talking with your primary care practitioner  Special Instructions: If you have smoked or chewed Tobacco  in the last 2 yrs please stop smoking, stop any regular Alcohol  and or any Recreational drug use.  Wear Seat belts while driving.  Please note: You were cared for by a hospitalist during your hospital stay. Once you are discharged, your primary care physician will handle any further medical issues. Please note that NO REFILLS for any discharge medications will be authorized once you are discharged, as it is imperative that you return to your primary care physician (or  establish a relationship with a primary care physician if you do not have one) for your post hospital discharge needs so that they can reassess your need for medications and monitor your lab values.  Total Time spent coordinating discharge including counseling, education and face to face time equals greater than 30 minutes.  Signed: Rhylan Kagel 05/21/2023 9:22 AM

## 2023-05-21 NOTE — Plan of Care (Signed)
 Progressing

## 2023-05-22 ENCOUNTER — Telehealth: Payer: Self-pay

## 2023-05-22 NOTE — Patient Instructions (Signed)
 Visit Information  Thank you for taking time to visit with me today. Please don't hesitate to contact me if I can be of assistance to you before our next scheduled telephone appointment.     The patient verbalized understanding of instructions, educational materials, and care plan provided today and DECLINED offer to receive copy of patient instructions, educational materials, and care plan.   No further follow up required: Patient part of Home Based Dr.Asenso  Please call the care guide team at [phone] if you need to cancel or reschedule your appointment.   Please call the Suicide and Crisis Lifeline: 988 if you are experiencing a Mental Health or Behavioral Health Crisis or need someone to talk to.  Bary Leriche RN, MSN [facility], [facility] RN Care Manager Direct Dial: [phone]  Fax: [phone] Website: [email or web address]

## 2023-05-22 NOTE — Transitions of Care (Post Inpatient/ED Visit) (Signed)
 05/22/2023  Name: Johnny Rodriguez MRN: [id number] DOB: January 30, 1951  Today's TOC FU Call Status: Today's TOC FU Call Status:: Successful TOC FU Call Completed TOC FU Call Complete Date: 05/22/23 Patient's Name and Date of Birth confirmed.  Transition Care Management Follow-up Telephone Call Date of Discharge: 05/21/23 Discharge Facility: [facility]) Type of Discharge: Inpatient Admission How have you been since you were released from the hospital?: Better Any questions or concerns?: No  Items Reviewed: Did you receive and understand the discharge instructions provided?: Yes Medications obtained,verified, and reconciled?: Yes (Medications Reviewed) Any new allergies since your discharge?: No Dietary orders reviewed?: Yes Type of Diet Ordered:: low sodium heart healthy Do you have support at home?: No  Medications Reviewed Today: Medications Reviewed Today     Reviewed by Fleeta Emmer, RN (Case Manager) on 05/22/23 at 1140  Med List Status: <None>   Medication Order Taking? Sig Documenting Provider Last Dose Status Informant  albuterol (PROVENTIL) (2.5 MG/3ML) 0.083% nebulizer solution [id number] Yes Take 3 mLs (2.5 mg total) by nebulization every 4 (four) hours as needed for wheezing or shortness of breath. Maretta Bees, MD Taking Active Self, Pharmacy Records  aspirin EC 81 MG tablet [id number] Yes Take 1 tablet (81 mg total) by mouth daily. Swallow whole. Maretta Bees, MD Taking Active Self, Pharmacy Records  ELIQUIS 5 MG TABS tablet [id number] Yes Take 1 tablet (5 mg total) by mouth 2 (two) times daily. Maretta Bees, MD Taking Active Self, Pharmacy Records  fluticasone furoate-vilanterol (BREO ELLIPTA) 100-25 MCG/ACT AEPB [id number] Yes Inhale 1 puff into the lungs daily. Maretta Bees, MD Taking Active Self, Pharmacy Records  folic acid (FOLVITE) 1 MG tablet [id number] Yes Take 1 tablet (1 mg total) by mouth daily. Maretta Bees, MD Taking Active  Self, Pharmacy Records  gabapentin (NEURONTIN) 400 MG capsule [id number] Yes Take 1 capsule (400 mg total) by mouth daily as needed (for nerve pain). Maretta Bees, MD Taking Active Self, Pharmacy Records  linaclotide [facility]) 145 MCG CAPS capsule [id number] Yes Take 1 capsule (145 mcg total) by mouth daily before breakfast. Maretta Bees, MD Taking Active Self, Pharmacy Records  metoprolol succinate (TOPROL-XL) 50 MG 24 hr tablet [id number] Yes Take 1 tablet (50 mg total) by mouth daily. Take with or immediately following a meal. Ghimire, Werner Lean, MD Taking Active Self, Pharmacy Records  OXYGEN [id number] Yes Inhale 3 L into the lungs continuous.  [provider] Taking Active Self, Pharmacy Records  pantoprazole (PROTONIX) 40 MG tablet [id number] Yes Take 1 tablet (40 mg total) by mouth daily as needed (acid reflux). Ghimire, Werner Lean, MD Taking Active Self, Pharmacy Records  polyethylene glycol (MIRALAX / GLYCOLAX) 17 g packet [id number] Yes Take 17 g by mouth daily.  Patient taking differently: Take 17 g by mouth daily as needed for mild constipation.   Dellia Cloud, MD Taking Active Self, Pharmacy Records  predniSONE (DELTASONE) 10 MG tablet [id number] Yes Take by mouth: 4 tablets (40mg  total) daily for 1 day, then 3 tablets (30mg  total) daily for 1 day, then 2 tablets (20mg  total) daily for 1 day, then 1 tablet (10mg  total) daily for 1 day, then stop. Maretta Bees, MD Taking Active   senna (SENOKOT) 8.6 MG TABS tablet [id number] Yes Take 1-2 tablets (8.6-17.2 mg total) by mouth daily.  Patient taking differently: Take 1-2 tablets by mouth daily as needed for moderate constipation.   Dellia Cloud, MD Taking Active Self, Pharmacy Records  SPIRIVA RESPIMAT 2.5 MCG/ACT AERS [id number] Yes Inhale 2 puffs into the lungs daily. Maretta Bees, MD Taking Active Self, Pharmacy Records  traZODone (DESYREL) 50 MG tablet [id number] Yes Take 1 tablet (50 mg total) by mouth at bedtime  as needed for sleep.  Patient taking differently: Take 50 mg by mouth at bedtime.   Maretta Bees, MD Taking Active Self, Pharmacy Records  VENTOLIN HFA 108 [id number]) MCG/ACT inhaler [id number] Yes Inhale 1-2 puffs into the lungs every 6 (six) hours as needed for wheezing or shortness of breath Ghimire, Werner Lean, MD Taking Active Self, Pharmacy Records            Home Care and Equipment/Supplies: Were Home Health Services Ordered?: Yes Name of Home Health Agency:: [facility] Has Agency set up a time to come to your home?: No EMR reviewed for Home Health Orders: Orders present/patient has not received call (refer to CM for follow-up) Any new equipment or medical supplies ordered?: No  Functional Questionnaire: Do you need assistance with bathing/showering or dressing?: No Do you need assistance with meal preparation?: No Do you need assistance with eating?: No Do you have difficulty maintaining continence: No Do you need assistance with getting out of bed/getting out of a chair/moving?: No Do you have difficulty managing or taking your medications?: No  Follow up appointments reviewed: PCP Follow-up appointment confirmed?: No (Patient sees Dr. Darleene Cleaver) MD Provider Line Number:[phone] Given: No Specialist Hospital Follow-up appointment confirmed?: Yes Date of Specialist follow-up appointment?: 06/03/23 Follow-Up Specialty Provider:: Pulmonary Do you need transportation to your follow-up appointment?: No Do you understand care options if your condition(s) worsen?: Yes-patient verbalized understanding   Patient reports being seen By Dr. Darleene Cleaver home based program.   Bary Leriche RN, MSN [place]  [facility], [facility] RN Care Manager Direct Dial: [phone]  Fax: [phone] Website: [email or web address]

## 2023-06-03 ENCOUNTER — Ambulatory Visit: Payer: Medicare HMO | Admitting: Pulmonary Disease

## 2023-06-03 ENCOUNTER — Other Ambulatory Visit: Payer: Self-pay

## 2023-06-03 ENCOUNTER — Inpatient Hospital Stay (HOSPITAL_COMMUNITY)
Admission: EM | Admit: 2023-06-03 | Discharge: 2023-06-06 | DRG: 189 | Disposition: A | Discharge status: Home / Self Care | Attending: Internal Medicine | Admitting: Internal Medicine

## 2023-06-03 ENCOUNTER — Emergency Department (HOSPITAL_COMMUNITY)

## 2023-06-03 ENCOUNTER — Encounter: Payer: Self-pay | Admitting: Pulmonary Disease

## 2023-06-03 DIAGNOSIS — J9611 Chronic respiratory failure with hypoxia: Secondary | ICD-10-CM | POA: Diagnosis present

## 2023-06-03 DIAGNOSIS — J9621 Acute and chronic respiratory failure with hypoxia: Secondary | ICD-10-CM | POA: Diagnosis not present

## 2023-06-03 DIAGNOSIS — R230 Cyanosis: Secondary | ICD-10-CM | POA: Diagnosis present

## 2023-06-03 DIAGNOSIS — J439 Emphysema, unspecified: Secondary | ICD-10-CM | POA: Diagnosis present

## 2023-06-03 DIAGNOSIS — D696 Thrombocytopenia, unspecified: Secondary | ICD-10-CM | POA: Diagnosis present

## 2023-06-03 DIAGNOSIS — Z716 Tobacco abuse counseling: Secondary | ICD-10-CM

## 2023-06-03 DIAGNOSIS — I2510 Atherosclerotic heart disease of native coronary artery without angina pectoris: Secondary | ICD-10-CM | POA: Diagnosis present

## 2023-06-03 DIAGNOSIS — I482 Chronic atrial fibrillation, unspecified: Secondary | ICD-10-CM

## 2023-06-03 DIAGNOSIS — R0602 Shortness of breath: Secondary | ICD-10-CM | POA: Diagnosis not present

## 2023-06-03 DIAGNOSIS — G629 Polyneuropathy, unspecified: Secondary | ICD-10-CM | POA: Diagnosis present

## 2023-06-03 DIAGNOSIS — I5042 Chronic combined systolic (congestive) and diastolic (congestive) heart failure: Secondary | ICD-10-CM | POA: Diagnosis present

## 2023-06-03 DIAGNOSIS — Z5320 Procedure and treatment not carried out because of patient's decision for unspecified reasons: Secondary | ICD-10-CM | POA: Diagnosis not present

## 2023-06-03 DIAGNOSIS — Z7951 Long term (current) use of inhaled steroids: Secondary | ICD-10-CM

## 2023-06-03 DIAGNOSIS — Z79899 Other long term (current) drug therapy: Secondary | ICD-10-CM

## 2023-06-03 DIAGNOSIS — I959 Hypotension, unspecified: Secondary | ICD-10-CM | POA: Diagnosis not present

## 2023-06-03 DIAGNOSIS — I428 Other cardiomyopathies: Secondary | ICD-10-CM | POA: Diagnosis present

## 2023-06-03 DIAGNOSIS — Z9981 Dependence on supplemental oxygen: Secondary | ICD-10-CM

## 2023-06-03 DIAGNOSIS — Z7901 Long term (current) use of anticoagulants: Secondary | ICD-10-CM

## 2023-06-03 DIAGNOSIS — I480 Paroxysmal atrial fibrillation: Secondary | ICD-10-CM

## 2023-06-03 DIAGNOSIS — F17210 Nicotine dependence, cigarettes, uncomplicated: Secondary | ICD-10-CM | POA: Diagnosis present

## 2023-06-03 DIAGNOSIS — E8729 Other acidosis: Secondary | ICD-10-CM | POA: Diagnosis present

## 2023-06-03 DIAGNOSIS — Z86711 Personal history of pulmonary embolism: Secondary | ICD-10-CM

## 2023-06-03 DIAGNOSIS — I5022 Chronic systolic (congestive) heart failure: Secondary | ICD-10-CM | POA: Diagnosis present

## 2023-06-03 DIAGNOSIS — Z8249 Family history of ischemic heart disease and other diseases of the circulatory system: Secondary | ICD-10-CM

## 2023-06-03 DIAGNOSIS — Z823 Family history of stroke: Secondary | ICD-10-CM

## 2023-06-03 DIAGNOSIS — G4700 Insomnia, unspecified: Secondary | ICD-10-CM

## 2023-06-03 DIAGNOSIS — J441 Chronic obstructive pulmonary disease with (acute) exacerbation: Secondary | ICD-10-CM | POA: Diagnosis present

## 2023-06-03 DIAGNOSIS — Z7982 Long term (current) use of aspirin: Secondary | ICD-10-CM

## 2023-06-03 DIAGNOSIS — Z825 Family history of asthma and other chronic lower respiratory diseases: Secondary | ICD-10-CM

## 2023-06-03 DIAGNOSIS — J9622 Acute and chronic respiratory failure with hypercapnia: Principal | ICD-10-CM

## 2023-06-03 DIAGNOSIS — I10 Essential (primary) hypertension: Secondary | ICD-10-CM | POA: Diagnosis present

## 2023-06-03 DIAGNOSIS — I110 Hypertensive heart disease with heart failure: Secondary | ICD-10-CM | POA: Diagnosis present

## 2023-06-03 DIAGNOSIS — G894 Chronic pain syndrome: Secondary | ICD-10-CM

## 2023-06-03 LAB — I-STAT ARTERIAL BLOOD GAS, ED
Acid-Base Excess: 10 mmol/L — ABNORMAL HIGH (ref 0.0–2.0)
Bicarbonate: 40.7 mmol/L — ABNORMAL HIGH (ref 20.0–28.0)
Calcium, Ion: 1.24 mmol/L (ref 1.15–1.40)
HCT: 41 % (ref 39.0–52.0)
Hemoglobin: 13.9 g/dL (ref 13.0–17.0)
O2 Saturation: 88 %
Patient temperature: 98.2
Potassium: 4 mmol/L (ref 3.5–5.1)
Sodium: 136 mmol/L (ref 135–145)
TCO2: 43 mmol/L — ABNORMAL HIGH (ref 22–32)
pCO2 arterial: 87.7 mmHg (ref 32–48)
pH, Arterial: 7.273 — ABNORMAL LOW (ref 7.35–7.45)
pO2, Arterial: 65 mmHg — ABNORMAL LOW (ref 83–108)

## 2023-06-03 MED ORDER — MAGNESIUM SULFATE 2 GM/50ML IV SOLN
2.0000 g | Freq: Once | INTRAVENOUS | Status: AC
Start: 2023-06-04 — End: 2023-06-04
  Administered 2023-06-04: 2 g via INTRAVENOUS
  Filled 2023-06-03: qty 50

## 2023-06-03 MED ORDER — FUROSEMIDE 10 MG/ML IJ SOLN
40.0000 mg | Freq: Once | INTRAMUSCULAR | Status: AC
  Administered 2023-06-04: 40 mg via INTRAVENOUS
  Filled 2023-06-03: qty 4

## 2023-06-03 MED ORDER — METHYLPREDNISOLONE SODIUM SUCC 125 MG IJ SOLR
125.0000 mg | Freq: Once | INTRAMUSCULAR | Status: AC
  Administered 2023-06-04: 125 mg via INTRAVENOUS
  Filled 2023-06-03: qty 2

## 2023-06-03 MED ORDER — IPRATROPIUM-ALBUTEROL 0.5-2.5 (3) MG/3ML IN SOLN
3.0000 mL | Freq: Once | RESPIRATORY_TRACT | Status: AC
  Administered 2023-06-04: 3 mL via RESPIRATORY_TRACT
  Filled 2023-06-03: qty 3

## 2023-06-03 NOTE — Progress Notes (Deleted)
 Synopsis: Referred in August 2022 for chronic respiratory failure and COPD by Lavona Pounds, NP  Subjective:   PATIENT ID: Johnny Rodriguez GENDER: male DOB: [birth year], MRN: [id number]  HPI  No chief complaint on file.  Johnny Rodriguez is a 73 year old male, daily smoker with atrial fibrillation, CAD, hypertension and cardiomyopathy who returns to pulmonary clinic for respiratory failure and COPD.    OV 10/07/21 He reports his breathing has been better since using breztri . He is using albuterol  about 3-4 times per day and is using his nebulizer machine at least once per day. He is smoking less than one cigarette per day. He is due for his lung cancer screening this year.  OV 03/25/21 He continues on supplemental oxygen  and has inogen POC. He was not able to pick up breztri  inhaler after last visit due to confusion on which pharmacy he was using. He has been using PRN albuterol  and primatene  mist inhalers. He is down to smoking 1 cigarette per day.  He reports picking up incruse inhaler yesterday, and is unsure who prescribed it for him.  He would like referral to our lung cancer screening program.   OV 10/01/20 Patient reports he has been on supplemental oxygen  since last year.  He complains of dyspnea with any sort of exertion.  He uses a wheelchair to get around.  He reports having Spiriva  and Incruse and an albuterol  rescue inhaler.  He used to have Dulera  but is unsure of his medication history.  He continues to smoke 1 pack/day.  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  He is enrolled in lung cancer screening program via his primary care team.  The most recent screening CT chest was done on 07/10/2020 and was lung RADS 2.  He has findings of mild centrilobular and paraseptal emphysematous changes.  Spirometry in 2018 showed severe obstructive defect.  FEV1 1 L.  Past Medical History:  Diagnosis Date   AF (atrial fibrillation) (HCC)    Alcohol abuse     Angina pectoris    CAD (coronary artery disease) 12/15/2004   COPD (chronic obstructive pulmonary disease) (HCC)    Coronary artery disease    Heavy cigarette smoker    HTN (hypertension)    Idiopathic cardiomyopathy (HCC) 07/16/2009   Non compliance with medical treatment    Seizures (HCC)      Family History  Problem Relation Age of Onset   Suicidality Sister    CVA Mother    Stroke Mother    Heart disease Father    Coronary artery disease Father    COPD Father      Social History   Socioeconomic History   Marital status: Single    Spouse name: Not on file   Number of children: Not on file   Years of education: Not on file   Highest education level: Not on file  Occupational History   Occupation: Production manager: UNEMPLOYED  Tobacco Use   Smoking status: Every Day    Current packs/day: 1.00    Average packs/day: 1 pack/day for 58.0 years (58.0 ttl pk-yrs)    Types: Cigarettes   Smokeless tobacco: Never   Tobacco comments:    pt states he is down to 1ppd 09/20/2020  Vaping Use   Vaping status: Never Used  Substance and Sexual Activity   Alcohol use: Yes    Comment: Drinks occasionally - not everyday   Drug use: Yes    Types:  Marijuana   Sexual activity: Not on file  Other Topics Concern   Not on file  Social History Narrative   ** Merged History Encounter **       Social Drivers of Health   Financial Resource Strain: Low Risk  (11/21/2022)   Overall Financial Resource Strain (CARDIA)    Difficulty of Paying Living Expenses: Not hard at all  Food Insecurity: No Food Insecurity (05/18/2023)   Hunger Vital Sign    Worried About Running Out of Food in the Last Year: Never true    Ran Out of Food in the Last Year: Never true  Transportation Needs: Unmet Transportation Needs (05/18/2023)   PRAPARE - Administrator, Civil Service (Medical): Yes    Lack of Transportation (Non-Medical): No  Physical Activity: Inactive (11/21/2022)    Exercise Vital Sign    Days of Exercise per Week: 0 days    Minutes of Exercise per Session: 0 min  Stress: No Stress Concern Present (11/21/2022)   [employer] of Occupational Health - Occupational Stress Questionnaire    Feeling of Stress : Only a little  Social Connections: Socially Isolated (05/18/2023)   Social Connection and Isolation Panel [NHANES]    Frequency of Communication with Friends and Family: Never    Frequency of Social Gatherings with Friends and Family: Never    Attends Religious Services: Never    Database administrator or Organizations: No    Attends Banker Meetings: Never    Marital Status: Never married  Intimate Partner Violence: Not At Risk (05/18/2023)   Humiliation, Afraid, Rape, and Kick questionnaire    Fear of Current or Ex-Partner: No    Emotionally Abused: No    Physically Abused: No    Sexually Abused: No     No Known Allergies   Outpatient Medications Prior to Visit  Medication Sig Dispense Refill   albuterol  (PROVENTIL ) (2.5 MG/3ML) 0.083% nebulizer solution Take 3 mLs (2.5 mg total) by nebulization every 4 (four) hours as needed for wheezing or shortness of breath. 1080 mL 12   aspirin  EC 81 MG tablet Take 1 tablet (81 mg total) by mouth daily. Swallow whole. 30 tablet 12   ELIQUIS  5 MG TABS tablet Take 1 tablet (5 mg total) by mouth 2 (two) times daily. 60 tablet 2   fluticasone  furoate-vilanterol (BREO ELLIPTA ) 100-25 MCG/ACT AEPB Inhale 1 puff into the lungs daily. 60 each 2   folic acid  (FOLVITE ) 1 MG tablet Take 1 tablet (1 mg total) by mouth daily. 30 tablet 0   gabapentin  (NEURONTIN ) 400 MG capsule Take 1 capsule (400 mg total) by mouth daily as needed (for nerve pain). 30 capsule 0   linaclotide  (LINZESS ) 145 MCG CAPS capsule Take 1 capsule (145 mcg total) by mouth daily before breakfast. 90 capsule 3   metoprolol  succinate (TOPROL -XL) 50 MG 24 hr tablet Take 1 tablet (50 mg total) by mouth daily. Take with or immediately  following a meal. 90 tablet 3   OXYGEN  Inhale 3 L into the lungs continuous.      pantoprazole  (PROTONIX ) 40 MG tablet Take 1 tablet (40 mg total) by mouth daily as needed (acid reflux). 30 tablet 2   polyethylene glycol (MIRALAX  / GLYCOLAX ) 17 g packet Take 17 g by mouth daily. (Patient taking differently: Take 17 g by mouth daily as needed for mild constipation.) 30 each 0   predniSONE  (DELTASONE ) 10 MG tablet Take by mouth: 4 tablets (40mg  total) daily for  1 day, then 3 tablets (30mg  total) daily for 1 day, then 2 tablets (20mg  total) daily for 1 day, then 1 tablet (10mg  total) daily for 1 day, then stop. 10 tablet 0   senna (SENOKOT) 8.6 MG TABS tablet Take 1-2 tablets (8.6-17.2 mg total) by mouth daily. (Patient taking differently: Take 1-2 tablets by mouth daily as needed for moderate constipation.) 30 tablet 0   SPIRIVA  RESPIMAT 2.5 MCG/ACT AERS Inhale 2 puffs into the lungs daily. 4 g 2   traZODone  (DESYREL ) 50 MG tablet Take 1 tablet (50 mg total) by mouth at bedtime as needed for sleep. (Patient taking differently: Take 50 mg by mouth at bedtime.) 30 tablet 0   VENTOLIN  HFA 108 (90 Base) MCG/ACT inhaler Inhale 1-2 puffs into the lungs every 6 (six) hours as needed for wheezing or shortness of breath 18 g 2   No facility-administered medications prior to visit.    Review of Systems  Constitutional:  Negative for chills, fever, malaise/fatigue and weight loss.  HENT:  Negative for congestion, sinus pain and sore throat.   Eyes: Negative.   Respiratory:  Positive for cough, sputum production, shortness of breath and wheezing. Negative for hemoptysis.   Cardiovascular:  Negative for chest pain, palpitations, orthopnea, claudication and leg swelling.  Gastrointestinal:  Negative for abdominal pain, heartburn, nausea and vomiting.  Genitourinary: Negative.   Musculoskeletal:  Negative for joint pain and myalgias.  Skin:  Negative for rash.  Neurological:  Negative for weakness.   Endo/Heme/Allergies: Negative.   Psychiatric/Behavioral: Negative.      Objective:   There were no vitals filed for this visit.   Physical Exam Constitutional:      General: He is not in acute distress. HENT:     Head: Normocephalic and atraumatic.  Cardiovascular:     Rate and Rhythm: Normal rate and regular rhythm.     Pulses: Normal pulses.     Heart sounds: Normal heart sounds. No murmur heard. Pulmonary:     Breath sounds: Decreased breath sounds present. No wheezing, rhonchi or rales.  Musculoskeletal:     Right lower leg: No edema.     Left lower leg: No edema.  Skin:    General: Skin is warm and dry.  Neurological:     General: No focal deficit present.     Mental Status: He is alert.  Psychiatric:        Mood and Affect: Mood normal.        Behavior: Behavior normal.        Thought Content: Thought content normal.        Judgment: Judgment normal.     CBC    Component Value Date/Time   WBC 13.2 (H) 05/21/2023 0737   RBC 4.15 (L) 05/21/2023 0737   HGB 12.9 (L) 05/21/2023 0737   HGB 14.2 08/15/2022 1420   HCT 41.1 05/21/2023 0737   HCT 44.0 08/15/2022 1420   PLT 289 05/21/2023 0737   PLT 202 08/15/2022 1420   MCV 99.0 05/21/2023 0737   MCV 94 08/15/2022 1420   MCV 93 02/05/2014 2217   MCH 31.1 05/21/2023 0737   MCHC 31.4 05/21/2023 0737   RDW 14.0 05/21/2023 0737   RDW 13.1 08/15/2022 1420   RDW 13.1 02/05/2014 2217   LYMPHSABS 1.1 05/18/2023 0652   MONOABS 1.0 05/18/2023 0652   EOSABS 0.0 05/18/2023 0652   BASOSABS 0.0 05/18/2023 0652      Latest Ref Rng & Units 05/21/2023  7:37 AM 05/20/2023    4:27 AM 05/19/2023    4:37 AM  BMP  Glucose 70 - 99 mg/dL 409  811  914   BUN 8 - 23 mg/dL 32  32  29   Creatinine 0.61 - 1.24 mg/dL 7.82  9.56  2.13   Sodium 135 - 145 mmol/L 135  135  138   Potassium 3.5 - 5.1 mmol/L 4.4  5.4  5.1   Chloride 98 - 111 mmol/L 90  92  92   CO2 22 - 32 mmol/L 35  34  36   Calcium  8.9 - 10.3 mg/dL 8.8  9.0  8.7     Chest imaging: CT Chest Lung Cancer Screening 07/10/20 Mild diffuse bronchial wall thickening with mild centrilobular and paraseptal emphysema. Lung RADS 2S. Atherosclerosis present.   PFT:     No data to display         Spirometry in 2018 shows severe obstructive defect with an FEV1 of 1 L / 29% predicted   Echo 06/16/19: LVEF 45 to 50%. Global hypokinesis. Mild LVH. RV systolic function is normal. Mildly elevated PA pressure 36.38mmHg. RA mildly dilated.   Assessment & Plan:   No diagnosis found.  Discussion: Johnny Rodriguez is a 73 year old male, daily smoker with atrial fibrillation, CAD, hypertension and cardiomyopathy who returns to pulmonary clinic for respiratory failure and COPD.  He is to continue breztri  inhaler 2 puffs twice daily for his COPD. He can continue as needed albuterol  inhaler and nebulizer treatments.  We try to have him scheduled for his lung cancer screening scan but he has transportation issues.  Follow-up in 1 year.  Duaine German, MD [facility] Office: [phone]      Current Outpatient Medications:    albuterol  (PROVENTIL ) (2.5 MG/3ML) 0.083% nebulizer solution, Take 3 mLs (2.5 mg total) by nebulization every 4 (four) hours as needed for wheezing or shortness of breath., Disp: 1080 mL, Rfl: 12   aspirin  EC 81 MG tablet, Take 1 tablet (81 mg total) by mouth daily. Swallow whole., Disp: 30 tablet, Rfl: 12   ELIQUIS  5 MG TABS tablet, Take 1 tablet (5 mg total) by mouth 2 (two) times daily., Disp: 60 tablet, Rfl: 2   fluticasone  furoate-vilanterol (BREO ELLIPTA ) 100-25 MCG/ACT AEPB, Inhale 1 puff into the lungs daily., Disp: 60 each, Rfl: 2   folic acid  (FOLVITE ) 1 MG tablet, Take 1 tablet (1 mg total) by mouth daily., Disp: 30 tablet, Rfl: 0   gabapentin  (NEURONTIN ) 400 MG capsule, Take 1 capsule (400 mg total) by mouth daily as needed (for nerve pain)., Disp: 30 capsule, Rfl: 0   linaclotide  (LINZESS ) 145 MCG CAPS capsule,  Take 1 capsule (145 mcg total) by mouth daily before breakfast., Disp: 90 capsule, Rfl: 3   metoprolol  succinate (TOPROL -XL) 50 MG 24 hr tablet, Take 1 tablet (50 mg total) by mouth daily. Take with or immediately following a meal., Disp: 90 tablet, Rfl: 3   OXYGEN , Inhale 3 L into the lungs continuous. , Disp: , Rfl:    pantoprazole  (PROTONIX ) 40 MG tablet, Take 1 tablet (40 mg total) by mouth daily as needed (acid reflux)., Disp: 30 tablet, Rfl: 2   polyethylene glycol (MIRALAX  / GLYCOLAX ) 17 g packet, Take 17 g by mouth daily. (Patient taking differently: Take 17 g by mouth daily as needed for mild constipation.), Disp: 30 each, Rfl: 0   predniSONE  (DELTASONE ) 10 MG tablet, Take by mouth: 4 tablets (40mg  total)  daily for 1 day, then 3 tablets (30mg  total) daily for 1 day, then 2 tablets (20mg  total) daily for 1 day, then 1 tablet (10mg  total) daily for 1 day, then stop., Disp: 10 tablet, Rfl: 0   senna (SENOKOT) 8.6 MG TABS tablet, Take 1-2 tablets (8.6-17.2 mg total) by mouth daily. (Patient taking differently: Take 1-2 tablets by mouth daily as needed for moderate constipation.), Disp: 30 tablet, Rfl: 0   SPIRIVA  RESPIMAT 2.5 MCG/ACT AERS, Inhale 2 puffs into the lungs daily., Disp: 4 g, Rfl: 2   traZODone  (DESYREL ) 50 MG tablet, Take 1 tablet (50 mg total) by mouth at bedtime as needed for sleep. (Patient taking differently: Take 50 mg by mouth at bedtime.), Disp: 30 tablet, Rfl: 0   VENTOLIN  HFA 108 (90 Base) MCG/ACT inhaler, Inhale 1-2 puffs into the lungs every 6 (six) hours as needed for wheezing or shortness of breath, Disp: 18 g, Rfl: 2

## 2023-06-03 NOTE — ED Provider Notes (Signed)
  EMERGENCY DEPARTMENT AT [facility] Provider Note   CSN: [id number] Arrival date & time: 06/03/23  2334     History {Add pertinent medical, surgical, social history, OB history to HPI:1} Chief Complaint  Patient presents with   Shortness of Breath    Johnny Rodriguez is a 73 y.o. male.  73 y.o.  male with history of COPD on 3 L of oxygen  at home, HFrEF, PE, PAF, HTN recently intubated in March here with SOB that onset earlier today.  He attempted to use his home nebulizer without relief.  EMS reports minimal air movement bilaterally.  He denies cough or fever.  Does have diffuse chest tightness that has been constant all day.  Feels like he is short of breath despite using his nebulizer at home.  EMS reports minimal air movement and increased shortness of breath with leg swelling.  No abdominal pain, vomiting or fever.  Denies any fever.  Denies any vomiting.  Denies any pain with urination or blood in the urine.  Continues to smoke "half a cigarette daily".  States compliance with his medications.  EMS attempted BiPAP assessment of status was waxing waning but patient refused.  The history is provided by the patient and the EMS personnel. The history is limited by the condition of the patient.  Shortness of Breath Associated symptoms: cough and wheezing   Associated symptoms: no abdominal pain, no chest pain, no fever, no headaches, no rash and no vomiting        Home Medications Prior to Admission medications   Medication Sig Start Date End Date Taking? Authorizing Provider  albuterol  (PROVENTIL ) (2.5 MG/3ML) 0.083% nebulizer solution Take 3 mLs (2.5 mg total) by nebulization every 4 (four) hours as needed for wheezing or shortness of breath. 05/16/23   Ghimire, Estil Heman, MD  aspirin  EC 81 MG tablet Take 1 tablet (81 mg total) by mouth daily. Swallow whole. 12/08/22   Ghimire, Estil Heman, MD  ELIQUIS  5 MG TABS tablet Take 1 tablet (5 mg total) by mouth 2 (two)  times daily. 05/16/23   Ghimire, Estil Heman, MD  fluticasone  furoate-vilanterol (BREO ELLIPTA ) 100-25 MCG/ACT AEPB Inhale 1 puff into the lungs daily. 05/16/23   Ghimire, Estil Heman, MD  folic acid  (FOLVITE ) 1 MG tablet Take 1 tablet (1 mg total) by mouth daily. 05/16/23   Ghimire, Estil Heman, MD  gabapentin  (NEURONTIN ) 400 MG capsule Take 1 capsule (400 mg total) by mouth daily as needed (for nerve pain). 05/16/23   Ghimire, Estil Heman, MD  linaclotide  (LINZESS ) 145 MCG CAPS capsule Take 1 capsule (145 mcg total) by mouth daily before breakfast. 05/16/23   Ghimire, Estil Heman, MD  metoprolol  succinate (TOPROL -XL) 50 MG 24 hr tablet Take 1 tablet (50 mg total) by mouth daily. Take with or immediately following a meal. 05/16/23   Ghimire, Estil Heman, MD  OXYGEN  Inhale 3 L into the lungs continuous.     [provider]  pantoprazole  (PROTONIX ) 40 MG tablet Take 1 tablet (40 mg total) by mouth daily as needed (acid reflux). 05/16/23   Ghimire, Estil Heman, MD  polyethylene glycol (MIRALAX  / GLYCOLAX ) 17 g packet Take 17 g by mouth daily. Patient taking differently: Take 17 g by mouth daily as needed for mild constipation. 03/15/19   Zoanne Hinders, MD  predniSONE  (DELTASONE ) 10 MG tablet Take by mouth: 4 tablets (40mg  total) daily for 1 day, then 3 tablets (30mg  total) daily for 1 day, then 2 tablets (20mg   total) daily for 1 day, then 1 tablet (10mg  total) daily for 1 day, then stop. 05/21/23   Ghimire, Estil Heman, MD  senna (SENOKOT) 8.6 MG TABS tablet Take 1-2 tablets (8.6-17.2 mg total) by mouth daily. Patient taking differently: Take 1-2 tablets by mouth daily as needed for moderate constipation. 03/15/19   Zoanne Hinders, MD  SPIRIVA  RESPIMAT 2.5 MCG/ACT AERS Inhale 2 puffs into the lungs daily. 05/16/23   Ghimire, Estil Heman, MD  traZODone  (DESYREL ) 50 MG tablet Take 1 tablet (50 mg total) by mouth at bedtime as needed for sleep. Patient taking differently: Take 50 mg by mouth at bedtime. 05/16/23   Ghimire, Estil Heman, MD   VENTOLIN  HFA 108 (90 Base) MCG/ACT inhaler Inhale 1-2 puffs into the lungs every 6 (six) hours as needed for wheezing or shortness of breath 05/16/23   Ghimire, Estil Heman, MD      Allergies    Patient has no known allergies.    Review of Systems   Review of Systems  Constitutional:  Negative for activity change, appetite change and fever.  HENT:  Negative for congestion and rhinorrhea.   Respiratory:  Positive for cough, chest tightness, shortness of breath and wheezing.   Cardiovascular:  Negative for chest pain.  Gastrointestinal:  Negative for abdominal pain, nausea and vomiting.  Genitourinary:  Negative for dysuria and hematuria.  Musculoskeletal:  Negative for arthralgias and myalgias.  Skin:  Negative for rash.  Neurological:  Negative for dizziness, weakness and headaches.    all other systems are negative except as noted in the HPI and PMH.   Physical Exam Updated Vital Signs Temp 98.2 F (36.8 C) (Temporal)   Ht 5\' 11"  (1.803 m)   Wt 66.2 kg   BMI 20.36 kg/m  Physical Exam Vitals and nursing note reviewed.  Constitutional:      General: He is in acute distress.     Appearance: He is well-developed. He is ill-appearing.     Comments: Moderate respiratory distress, speaking in short phrase  HENT:     Head: Normocephalic and atraumatic.     Mouth/Throat:     Pharynx: No oropharyngeal exudate.  Eyes:     Conjunctiva/sclera: Conjunctivae normal.     Pupils: Pupils are equal, round, and reactive to light.  Neck:     Comments: No meningismus. Cardiovascular:     Rate and Rhythm: Normal rate and regular rhythm.     Heart sounds: Normal heart sounds. No murmur heard. Pulmonary:     Effort: Respiratory distress present.     Breath sounds: Wheezing present.     Comments: Diminished, minimal air movement bilaterally Abdominal:     Palpations: Abdomen is soft.     Tenderness: There is no abdominal tenderness. There is no guarding or rebound.  Musculoskeletal:         General: No tenderness. Normal range of motion.     Cervical back: Normal range of motion and neck supple.     Right lower leg: Edema present.     Left lower leg: Edema present.  Skin:    General: Skin is warm.  Neurological:     Mental Status: He is alert and oriented to person, place, and time.     Cranial Nerves: No cranial nerve deficit.     Motor: No abnormal muscle tone.     Coordination: Coordination normal.     Comments: No ataxia on finger to nose bilaterally. No pronator drift. 5/5 strength throughout. CN 2-12 intact.Equal  grip strength. Sensation intact.   Psychiatric:        Behavior: Behavior normal.     ED Results / Procedures / Treatments   Labs (all labs ordered are listed, but only abnormal results are displayed) Labs Reviewed  CBC WITH DIFFERENTIAL/PLATELET  COMPREHENSIVE METABOLIC PANEL WITH GFR  BRAIN NATRIURETIC PEPTIDE  PROTIME-INR  LIPASE, BLOOD  I-STAT ARTERIAL BLOOD GAS, ED  TROPONIN I (HIGH SENSITIVITY)    EKG EKG Interpretation Date/Time:  Wednesday June 03 2023 23:43:49 EDT Ventricular Rate:  101 PR Interval:    QRS Duration:  96 QT Interval:  341 QTC Calculation: 442 R Axis:   -88  Text Interpretation: Atrial fibrillation Abnormal R-wave progression, late transition Inferior infarct, old No significant change was found Confirmed by Earma Gloss [id number]) on 06/03/2023 11:50:14 PM  Radiology No results found.  Procedures Procedures  {Document cardiac monitor, telemetry assessment procedure when appropriate:1}  Medications Ordered in ED Medications  ipratropium-albuterol  (DUONEB) 0.5-2.5 (3) MG/3ML nebulizer solution 3 mL (has no administration in time range)  methylPREDNISolone  sodium succinate (SOLU-MEDROL ) 125 mg/2 mL injection 125 mg (has no administration in time range)  magnesium  sulfate IVPB 2 g 50 mL (has no administration in time range)    ED Course/ Medical Decision Making/ A&P   {   Click here for ABCD2, HEART and  other calculatorsREFRESH Note before signing :1}                              Medical Decision Making Amount and/or Complexity of Data Reviewed Independent Historian: EMS Labs: ordered. Decision-making details documented in ED Course. Radiology: ordered and independent interpretation performed. Decision-making details documented in ED Course. ECG/medicine tests: ordered and independent interpretation performed. Decision-making details documented in ED Course.  Risk Prescription drug management.   Respiratory distress with history of COPD and CHF.  Refused BiPAP for EMS.  He is weaned from nonrebreather to nasal cannula on arrival with maintaining his O2 saturations.  Will give bronchodilators and steroids and Lasix .  {Document critical care time when appropriate:1} {Document review of labs and clinical decision tools ie heart score, Chads2Vasc2 etc:1}  {Document your independent review of radiology images, and any outside records:1} {Document your discussion with family members, caretakers, and with consultants:1} {Document social determinants of health affecting pt's care:1} {Document your decision making why or why not admission, treatments were needed:1} Final Clinical Impression(s) / ED Diagnoses Final diagnoses:  None    Rx / DC Orders ED Discharge Orders     None

## 2023-06-04 ENCOUNTER — Inpatient Hospital Stay: Admitting: Adult Health

## 2023-06-04 ENCOUNTER — Encounter (HOSPITAL_COMMUNITY): Payer: Self-pay | Admitting: Internal Medicine

## 2023-06-04 ENCOUNTER — Encounter: Payer: Self-pay | Admitting: Adult Health

## 2023-06-04 DIAGNOSIS — G4700 Insomnia, unspecified: Secondary | ICD-10-CM

## 2023-06-04 DIAGNOSIS — F17210 Nicotine dependence, cigarettes, uncomplicated: Secondary | ICD-10-CM

## 2023-06-04 DIAGNOSIS — J9622 Acute and chronic respiratory failure with hypercapnia: Secondary | ICD-10-CM

## 2023-06-04 DIAGNOSIS — I5022 Chronic systolic (congestive) heart failure: Secondary | ICD-10-CM

## 2023-06-04 DIAGNOSIS — I2510 Atherosclerotic heart disease of native coronary artery without angina pectoris: Secondary | ICD-10-CM | POA: Diagnosis present

## 2023-06-04 DIAGNOSIS — I428 Other cardiomyopathies: Secondary | ICD-10-CM | POA: Diagnosis present

## 2023-06-04 DIAGNOSIS — Z5320 Procedure and treatment not carried out because of patient's decision for unspecified reasons: Secondary | ICD-10-CM | POA: Diagnosis not present

## 2023-06-04 DIAGNOSIS — Z7901 Long term (current) use of anticoagulants: Secondary | ICD-10-CM | POA: Diagnosis not present

## 2023-06-04 DIAGNOSIS — E8729 Other acidosis: Secondary | ICD-10-CM | POA: Diagnosis present

## 2023-06-04 DIAGNOSIS — J441 Chronic obstructive pulmonary disease with (acute) exacerbation: Secondary | ICD-10-CM

## 2023-06-04 DIAGNOSIS — D696 Thrombocytopenia, unspecified: Secondary | ICD-10-CM | POA: Diagnosis present

## 2023-06-04 DIAGNOSIS — I959 Hypotension, unspecified: Secondary | ICD-10-CM | POA: Diagnosis not present

## 2023-06-04 DIAGNOSIS — I480 Paroxysmal atrial fibrillation: Secondary | ICD-10-CM | POA: Diagnosis present

## 2023-06-04 DIAGNOSIS — R230 Cyanosis: Secondary | ICD-10-CM | POA: Diagnosis present

## 2023-06-04 DIAGNOSIS — J9611 Chronic respiratory failure with hypoxia: Secondary | ICD-10-CM

## 2023-06-04 DIAGNOSIS — Z79899 Other long term (current) drug therapy: Secondary | ICD-10-CM | POA: Diagnosis not present

## 2023-06-04 DIAGNOSIS — R0602 Shortness of breath: Secondary | ICD-10-CM | POA: Diagnosis present

## 2023-06-04 DIAGNOSIS — J9621 Acute and chronic respiratory failure with hypoxia: Secondary | ICD-10-CM | POA: Diagnosis present

## 2023-06-04 DIAGNOSIS — I10 Essential (primary) hypertension: Secondary | ICD-10-CM

## 2023-06-04 DIAGNOSIS — J439 Emphysema, unspecified: Secondary | ICD-10-CM | POA: Diagnosis present

## 2023-06-04 DIAGNOSIS — Z86711 Personal history of pulmonary embolism: Secondary | ICD-10-CM

## 2023-06-04 DIAGNOSIS — G629 Polyneuropathy, unspecified: Secondary | ICD-10-CM | POA: Diagnosis present

## 2023-06-04 DIAGNOSIS — I482 Chronic atrial fibrillation, unspecified: Secondary | ICD-10-CM

## 2023-06-04 DIAGNOSIS — G894 Chronic pain syndrome: Secondary | ICD-10-CM

## 2023-06-04 DIAGNOSIS — I110 Hypertensive heart disease with heart failure: Secondary | ICD-10-CM | POA: Diagnosis present

## 2023-06-04 DIAGNOSIS — Z7951 Long term (current) use of inhaled steroids: Secondary | ICD-10-CM | POA: Diagnosis not present

## 2023-06-04 DIAGNOSIS — Z716 Tobacco abuse counseling: Secondary | ICD-10-CM | POA: Diagnosis not present

## 2023-06-04 LAB — RESPIRATORY PANEL BY PCR

## 2023-06-04 LAB — CBC
HCT: 41.2 % (ref 39.0–52.0)
Hemoglobin: 12.9 g/dL — ABNORMAL LOW (ref 13.0–17.0)
MCH: 31.9 pg (ref 26.0–34.0)
MCHC: 31.3 g/dL (ref 30.0–36.0)
MCV: 101.7 fL — ABNORMAL HIGH (ref 80.0–100.0)
Platelets: 135 10*3/uL — ABNORMAL LOW (ref 150–400)
RBC: 4.05 MIL/uL — ABNORMAL LOW (ref 4.22–5.81)
RDW: 14.4 % (ref 11.5–15.5)
WBC: 5.4 10*3/uL (ref 4.0–10.5)
nRBC: 0 % (ref 0.0–0.2)

## 2023-06-04 LAB — COMPREHENSIVE METABOLIC PANEL WITH GFR
ALT: 19 U/L (ref 0–44)
ALT: 17 U/L (ref 0–44)
AST: 17 U/L (ref 15–41)
AST: 18 U/L (ref 15–41)
Albumin: 3.3 g/dL — ABNORMAL LOW (ref 3.5–5.0)
Albumin: 3 g/dL — ABNORMAL LOW (ref 3.5–5.0)
Alkaline Phosphatase: 55 U/L (ref 38–126)
Alkaline Phosphatase: 52 U/L (ref 38–126)
Anion gap: 11 (ref 5–15)
Anion gap: 11 (ref 5–15)
BUN: 10 mg/dL (ref 8–23)
BUN: 10 mg/dL (ref 8–23)
CO2: 38 mmol/L — ABNORMAL HIGH (ref 22–32)
CO2: 33 mmol/L — ABNORMAL HIGH (ref 22–32)
Calcium: 8.8 mg/dL — ABNORMAL LOW (ref 8.9–10.3)
Calcium: 8.3 mg/dL — ABNORMAL LOW (ref 8.9–10.3)
Chloride: 89 mmol/L — ABNORMAL LOW (ref 98–111)
Chloride: 93 mmol/L — ABNORMAL LOW (ref 98–111)
Creatinine, Ser: 0.94 mg/dL (ref 0.61–1.24)
Creatinine, Ser: 0.92 mg/dL (ref 0.61–1.24)
GFR, Estimated: >60 mL/min (ref >60)
GFR, Estimated: >60 mL/min (ref >60)
Glucose, Bld: 141 mg/dL — ABNORMAL HIGH (ref 70–99)
Glucose, Bld: 162 mg/dL — ABNORMAL HIGH (ref 70–99)
Potassium: 3.7 mmol/L (ref 3.5–5.1)
Potassium: 3.8 mmol/L (ref 3.5–5.1)
Sodium: 138 mmol/L (ref 135–145)
Sodium: 137 mmol/L (ref 135–145)
Total Bilirubin: 0.7 mg/dL (ref 0.0–1.2)
Total Bilirubin: 0.7 mg/dL (ref 0.0–1.2)
Total Protein: 6.2 g/dL — ABNORMAL LOW (ref 6.5–8.1)
Total Protein: 5.9 g/dL — ABNORMAL LOW (ref 6.5–8.1)

## 2023-06-04 LAB — I-STAT ARTERIAL BLOOD GAS, ED
Acid-Base Excess: 13 mmol/L — ABNORMAL HIGH (ref 0.0–2.0)
Acid-Base Excess: 13 mmol/L — ABNORMAL HIGH (ref 0.0–2.0)
Acid-Base Excess: 12 mmol/L — ABNORMAL HIGH (ref 0.0–2.0)
Bicarbonate: 43.5 mmol/L — ABNORMAL HIGH (ref 20.0–28.0)
Bicarbonate: 43.4 mmol/L — ABNORMAL HIGH (ref 20.0–28.0)
Bicarbonate: 41.2 mmol/L — ABNORMAL HIGH (ref 20.0–28.0)
Calcium, Ion: 1.21 mmol/L (ref 1.15–1.40)
Calcium, Ion: 1.18 mmol/L (ref 1.15–1.40)
Calcium, Ion: 1.16 mmol/L (ref 1.15–1.40)
HCT: 42 % (ref 39.0–52.0)
HCT: 41 % (ref 39.0–52.0)
HCT: 38 % — ABNORMAL LOW (ref 39.0–52.0)
Hemoglobin: 14.3 g/dL (ref 13.0–17.0)
Hemoglobin: 13.9 g/dL (ref 13.0–17.0)
Hemoglobin: 12.9 g/dL — ABNORMAL LOW (ref 13.0–17.0)
O2 Saturation: 90 %
O2 Saturation: 92 %
O2 Saturation: 97 %
Patient temperature: 98.4
Patient temperature: 36.8
Potassium: 3.7 mmol/L (ref 3.5–5.1)
Potassium: 3.8 mmol/L (ref 3.5–5.1)
Potassium: 4.5 mmol/L (ref 3.5–5.1)
Sodium: 136 mmol/L (ref 135–145)
Sodium: 135 mmol/L (ref 135–145)
Sodium: 137 mmol/L (ref 135–145)
TCO2: 46 mmol/L — ABNORMAL HIGH (ref 22–32)
TCO2: 46 mmol/L — ABNORMAL HIGH (ref 22–32)
TCO2: 44 mmol/L — ABNORMAL HIGH (ref 22–32)
pCO2 arterial: 88.2 mmHg (ref 32–48)
pCO2 arterial: 87.7 mmHg (ref 32–48)
pCO2 arterial: 77.8 mmHg (ref 32–48)
pH, Arterial: 7.301 — ABNORMAL LOW (ref 7.35–7.45)
pH, Arterial: 7.302 — ABNORMAL LOW (ref 7.35–7.45)
pH, Arterial: 7.332 — ABNORMAL LOW (ref 7.35–7.45)
pO2, Arterial: 69 mmHg — ABNORMAL LOW (ref 83–108)
pO2, Arterial: 76 mmHg — ABNORMAL LOW (ref 83–108)
pO2, Arterial: 98 mmHg (ref 83–108)

## 2023-06-04 LAB — TROPONIN I (HIGH SENSITIVITY)
Troponin I (High Sensitivity): 8 ng/L (ref <18)
Troponin I (High Sensitivity): 10 ng/L (ref <18)

## 2023-06-04 LAB — CBG MONITORING, ED
Glucose-Capillary: 146 mg/dL — ABNORMAL HIGH (ref 70–99)
Glucose-Capillary: 151 mg/dL — ABNORMAL HIGH (ref 70–99)
Glucose-Capillary: 135 mg/dL — ABNORMAL HIGH (ref 70–99)

## 2023-06-04 LAB — CBC WITH DIFFERENTIAL/PLATELET
Abs Immature Granulocytes: 0.02 10*3/uL (ref 0.00–0.07)
Basophils Absolute: 0 10*3/uL (ref 0.0–0.1)
Basophils Relative: 0 %
Eosinophils Absolute: 0.1 10*3/uL (ref 0.0–0.5)
Eosinophils Relative: 2 %
HCT: 42.4 % (ref 39.0–52.0)
Hemoglobin: 13.1 g/dL (ref 13.0–17.0)
Immature Granulocytes: 0 %
Lymphocytes Relative: 16 %
Lymphs Abs: 1 10*3/uL (ref 0.7–4.0)
MCH: 31.4 pg (ref 26.0–34.0)
MCHC: 30.9 g/dL (ref 30.0–36.0)
MCV: 101.7 fL — ABNORMAL HIGH (ref 80.0–100.0)
Monocytes Absolute: 0.4 10*3/uL (ref 0.1–1.0)
Monocytes Relative: 7 %
Neutro Abs: 4.7 10*3/uL (ref 1.7–7.7)
Neutrophils Relative %: 75 %
Platelets: 141 10*3/uL — ABNORMAL LOW (ref 150–400)
RBC: 4.17 MIL/uL — ABNORMAL LOW (ref 4.22–5.81)
RDW: 14.4 % (ref 11.5–15.5)
WBC: 6.3 10*3/uL (ref 4.0–10.5)
nRBC: 0 % (ref 0.0–0.2)

## 2023-06-04 LAB — MRSA NEXT GEN BY PCR, NASAL: MRSA by PCR Next Gen: NOT DETECTED

## 2023-06-04 LAB — BRAIN NATRIURETIC PEPTIDE: B Natriuretic Peptide: 115.9 pg/mL — ABNORMAL HIGH (ref 0.0–100.0)

## 2023-06-04 LAB — PROTIME-INR
INR: 1.4 — ABNORMAL HIGH (ref 0.8–1.2)
Prothrombin Time: 17.6 s — ABNORMAL HIGH (ref 11.4–15.2)

## 2023-06-04 LAB — I-STAT CG4 LACTIC ACID, ED: Lactic Acid, Venous: 1.5 mmol/L (ref 0.5–1.9)

## 2023-06-04 MED ORDER — NICOTINE 21 MG/24HR TD PT24
21.0000 mg | MEDICATED_PATCH | Freq: Every day | TRANSDERMAL | Status: DC
  Filled 2023-06-04 (×2): qty 1

## 2023-06-04 MED ORDER — APIXABAN 5 MG PO TABS: 5.0000 mg | ORAL_TABLET | Freq: Two times a day (BID) | ORAL | Status: DC

## 2023-06-04 MED ORDER — METHYLPREDNISOLONE SODIUM SUCC 125 MG IJ SOLR: 60.0000 mg | Freq: Two times a day (BID) | INTRAMUSCULAR | Status: DC

## 2023-06-04 MED ORDER — ALBUMIN HUMAN 25 % IV SOLN
25.0000 g | INTRAVENOUS | Status: AC
  Administered 2023-06-04: 25 g via INTRAVENOUS
  Filled 2023-06-04: qty 100

## 2023-06-04 MED ORDER — APIXABAN 5 MG PO TABS
5.0000 mg | ORAL_TABLET | Freq: Two times a day (BID) | ORAL | Status: DC
  Administered 2023-06-04 – 2023-06-06 (×4): 5 mg via ORAL
  Filled 2023-06-04 (×4): qty 1

## 2023-06-04 MED ORDER — IPRATROPIUM-ALBUTEROL 0.5-2.5 (3) MG/3ML IN SOLN: 3.0000 mL | Freq: Four times a day (QID) | RESPIRATORY_TRACT | Status: DC

## 2023-06-04 MED ORDER — METOPROLOL SUCCINATE ER 25 MG PO TB24: 50.0000 mg | ORAL_TABLET | Freq: Every day | ORAL | Status: DC

## 2023-06-04 MED ORDER — METHYLPREDNISOLONE SODIUM SUCC 125 MG IJ SOLR
60.0000 mg | Freq: Two times a day (BID) | INTRAMUSCULAR | Status: DC
Start: 2023-06-04 — End: 2023-06-05
  Administered 2023-06-04 – 2023-06-05 (×2): 60 mg via INTRAVENOUS
  Filled 2023-06-04 (×2): qty 2

## 2023-06-04 MED ORDER — SODIUM CHLORIDE 0.9 % IV BOLUS
250.0000 mL | INTRAVENOUS | Status: DC
Start: 2023-06-04 — End: 2023-06-04

## 2023-06-04 MED ORDER — METOPROLOL SUCCINATE ER 25 MG PO TB24: 12.5000 mg | ORAL_TABLET | Freq: Every day | ORAL | Status: DC

## 2023-06-04 MED ORDER — ONDANSETRON HCL 4 MG PO TABS: 4.0000 mg | ORAL_TABLET | Freq: Four times a day (QID) | ORAL | Status: DC | PRN

## 2023-06-04 MED ORDER — ONDANSETRON HCL 4 MG/2ML IJ SOLN: 4.0000 mg | Freq: Four times a day (QID) | INTRAMUSCULAR | Status: DC | PRN

## 2023-06-04 MED ORDER — SODIUM CHLORIDE 0.9% FLUSH: 3.0000 mL | INTRAVENOUS | Status: DC | PRN

## 2023-06-04 MED ORDER — ACETAMINOPHEN 650 MG RE SUPP: 650.0000 mg | Freq: Four times a day (QID) | RECTAL | Status: DC | PRN

## 2023-06-04 MED ORDER — SODIUM CHLORIDE 0.9 % IV BOLUS
500.0000 mL | INTRAVENOUS | Status: AC
  Administered 2023-06-04: 500 mL via INTRAVENOUS

## 2023-06-04 MED ORDER — IPRATROPIUM-ALBUTEROL 0.5-2.5 (3) MG/3ML IN SOLN
3.0000 mL | RESPIRATORY_TRACT | Status: DC | PRN
  Administered 2023-06-04: 3 mL via RESPIRATORY_TRACT

## 2023-06-04 MED ORDER — SODIUM CHLORIDE 0.9 % IV BOLUS: 750.0000 mL | INTRAVENOUS | Status: DC

## 2023-06-04 MED ORDER — SODIUM CHLORIDE 0.9 % IV SOLN: 250.0000 mL | INTRAVENOUS | Status: DC | PRN

## 2023-06-04 MED ORDER — MIDODRINE HCL 5 MG PO TABS: 10.0000 mg | ORAL_TABLET | ORAL | Status: DC

## 2023-06-04 MED ORDER — SODIUM CHLORIDE 0.9% FLUSH
3.0000 mL | Freq: Two times a day (BID) | INTRAVENOUS | Status: DC
  Administered 2023-06-04 (×2): 3 mL via INTRAVENOUS

## 2023-06-04 MED ORDER — AZITHROMYCIN 500 MG PO TABS
500.0000 mg | ORAL_TABLET | Freq: Every day | ORAL | Status: DC
  Administered 2023-06-05 – 2023-06-06 (×2): 500 mg via ORAL
  Filled 2023-06-04 (×2): qty 1

## 2023-06-04 MED ORDER — BUDESONIDE 0.25 MG/2ML IN SUSP
0.2500 mg | Freq: Two times a day (BID) | RESPIRATORY_TRACT | Status: DC
  Administered 2023-06-04 – 2023-06-06 (×5): 0.25 mg via RESPIRATORY_TRACT
  Filled 2023-06-04 (×5): qty 2

## 2023-06-04 MED ORDER — ACETAMINOPHEN 325 MG PO TABS: 650.0000 mg | ORAL_TABLET | Freq: Four times a day (QID) | ORAL | Status: DC | PRN

## 2023-06-04 MED ORDER — SODIUM CHLORIDE 0.9 % IV BOLUS
250.0000 mL | INTRAVENOUS | Status: AC
  Administered 2023-06-04: 250 mL via INTRAVENOUS

## 2023-06-04 MED ORDER — ASPIRIN 81 MG PO TBEC
81.0000 mg | DELAYED_RELEASE_TABLET | Freq: Every day | ORAL | Status: DC
  Administered 2023-06-05 – 2023-06-06 (×2): 81 mg via ORAL
  Filled 2023-06-04 (×2): qty 1

## 2023-06-04 MED ORDER — SODIUM CHLORIDE 0.9 % IV SOLN
500.0000 mg | INTRAVENOUS | Status: DC
  Administered 2023-06-04: 500 mg via INTRAVENOUS
  Filled 2023-06-04: qty 5

## 2023-06-04 MED ORDER — IPRATROPIUM-ALBUTEROL 0.5-2.5 (3) MG/3ML IN SOLN
3.0000 mL | Freq: Four times a day (QID) | RESPIRATORY_TRACT | Status: DC
  Administered 2023-06-04: 3 mL via RESPIRATORY_TRACT
  Filled 2023-06-04 (×2): qty 3

## 2023-06-04 NOTE — Assessment & Plan Note (Addendum)
 06-04-2023 hold his HTN for now due to low BP.  06-05-2023 BP stable off HTN meds.  06-06-2023 stable.

## 2023-06-04 NOTE — Progress Notes (Addendum)
 ABG showing pH 7.3, pCO2 still high 87, PO2 76.  Plan to continue BiPAP at least for next 4 to 6 hours and repeat ABG around 9 AM.   Mykelle Cockerell, MD [facility] 06/04/2023, 5:22 AM

## 2023-06-04 NOTE — ED Notes (Signed)
 Dr. Sundil made aware of hypotension; new orders placed

## 2023-06-04 NOTE — Subjective & Objective (Addendum)
 Pt seen and examined.  Pt refused Bipap last night.  Pt demanding to go home. Told RN that he will leave AMA if he has to.

## 2023-06-04 NOTE — Hospital Course (Signed)
 HPI: Johnny Rodriguez is a 73 y.o. 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  EMS found the patient O2 sat 80% on 2 L oxygen  however patient refused CPAP or patient has been recently admitted and discharge on 3/26 to 4/10 for acute hypoxic respiratory failure and exacerbation and CHF exacerbation.   During my evaluation at the bedside patient is on BiPAP.  He has been reporting cough and progressive worsening shortness of breath.  Denies any fever, chill, chest pain, and palpitation.  Denies any lower extremities swelling.  Denies any recent known sick contact..  No other complaint at this time.     ED Course:  At presentation to ED O2 sat 93 to 98% on BiPAP.  Otherwise hemodynamically stable. VBG showing pH 7.2, pCO2 87, pO2 65, bicarb 14. Normal lactic acid level 1.5. Elevated pro time INR 17 and 1.4. Normal troponin. Slight elevated BNP around 115.9. CBC unremarkable except low platelet count 141. Pending CMP.   Chest x-ray emphysema lung with a small stable right pleural effusion.   In the patient has been treated with Lasix  40 mg, methylprednisolone , DuoNeb and mag sulfate 2 g.   Hospitalist has been consulted for further evaluation management of acute on chronic hypoxic, hypercarbic respiratory failure and COPD exacerbation.  Significant Events: Admitted 06/03/2023 for COPD exacerbation   Significant Labs: Abg pH 7.27, PCO2 of 87, PO2 of 65 Lactic acid 1.6 WBC 6.3, HgB 13.1, Plt 141 BNP 115 INR 1.4 Na 138, K 3.7, CO2 of 38, BUN 10, Scr 0.94 RVP negative for 20 pathogens  Significant Imaging Studies: CXR Emphysematous lung disease with a very small, stable right pleural effusion.  Antibiotic  Therapy: Anti-infectives (From admission, onward)    Start     Dose/Rate Route Frequency Ordered Stop   06/04/23 0200  azithromycin  (ZITHROMAX ) 500 mg in sodium chloride  0.9 % 250 mL IVPB        500 mg 250 mL/hr over 60 Minutes Intravenous Every 24 hours 06/04/23 0125 06/09/23 0159       Procedures:   Consultants:

## 2023-06-04 NOTE — Assessment & Plan Note (Addendum)
 06-04-2023 chronic.  06-05-2023 chronic.  06-06-2023 stable.

## 2023-06-04 NOTE — Assessment & Plan Note (Addendum)
 06-04-2023 continue with IV solumedrol, duonebs q6h, zithromax   06-05-2023 baseline PCO2 around high 80s to low 90s on VBG. Pt refused bipap last night. Change to po prednisone  and monitor. Potential for DC tomorrow. Continue q6h nebs, po zithromax .  06-06-2023 DC to home with 12 day prednisone  taper and 3 more days of zithromax . Pt states he has nebulizer medication at home. marginally stable for DC.  Pt is demanding to be discharged today.  He has a HIGH RISK of readmission

## 2023-06-04 NOTE — ED Triage Notes (Signed)
 Pt BIB [facility] from home. Pt c/o SOB all day that got worse tonight. Pt attempted home neb treatment but made not improvement. On EMS arrival pt was 80% on 2 L [place]. EMS attempted CPAP but pt refused.

## 2023-06-04 NOTE — Assessment & Plan Note (Addendum)
 06-04-2023 currently on bipap. pH and PCO2 have plateaued and not improved for several hours. Repeat ABG at 9 am.  pH 7.3 and PO2 around 80. Pt is awake and alert. Do not think he needs intubation at this point. Will need to monitor him closely.   06-05-2023 baseline PCO2 around high 80s to low 90s on VBG. Pt refused bipap last night. Change to po prednisone  and monitor. Potential for DC tomorrow.  06-06-2023 in no distress. Pt demanding to be discharged today. Pt advised not to smoke any longer. Pt states "that ain't a problem anymore".

## 2023-06-04 NOTE — H&P (Addendum)
 History and Physical    Domnic Vantol ZYS:[id number] DOB: 05/30/1950 DOA: 06/03/2023  PCP: Pcp, No   Patient coming from: Home   Chief Complaint:  Chief Complaint  Patient presents with   Shortness of Breath   ED TRIAGE note:Pt BIB [facility] from home. Pt c/o SOB all day that got worse tonight. Pt attempted home neb treatment but made not improvement. On EMS arrival pt was 80% on 2 L Time. EMS attempted CPAP but pt refused.   HPI:  Johnny Rodriguez is a 73 y.o. 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  EMS found the patient O2 sat 80% on 2 L oxygen  however patient refused CPAP or patient has been recently admitted and discharge on 3/26 to 4/10 for acute hypoxic respiratory failure and exacerbation and CHF exacerbation.  During my evaluation at the bedside patient is on BiPAP.  He has been reporting cough and progressive worsening shortness of breath.  Denies any fever, chill, chest pain, and palpitation.  Denies any lower extremities swelling.  Denies any recent known sick contact..  No other complaint at this time.   ED Course:  At presentation to ED O2 sat 93 to 98% on BiPAP.  Otherwise hemodynamically stable. VBG showing pH 7.2, pCO2 87, pO2 65, bicarb 14. Normal lactic acid level 1.5. Elevated pro time INR 17 and 1.4. Normal troponin. Slight elevated BNP around 115.9. CBC unremarkable except low platelet count 141. Pending CMP.  Chest x-ray emphysema lung with a small stable right pleural effusion.  In the patient has been treated with Lasix  40 mg, methylprednisolone , DuoNeb and mag sulfate 2 g.  Hospitalist has been consulted for further evaluation management of acute on chronic hypoxic,  hypercarbic respiratory failure and COPD exacerbation.    Significant labs in the ED: Lab Orders         Respiratory (~20 pathogens) panel by PCR         MRSA Next Gen by PCR, Nasal         CBC with Differential         Brain natriuretic peptide         Protime-INR         Comprehensive metabolic panel with GFR         Blood gas, arterial         Comprehensive metabolic panel         CBC         I-Stat arterial blood gas, ED ([facility], [facility], [facility])         I-Stat CG4 Lactic Acid, ED         I-Stat arterial blood gas, ED       Review of Systems:  Review of Systems  Constitutional:  Negative for chills, fever and weight loss.  Respiratory:  Positive for cough, sputum production, shortness of breath and wheezing.   Cardiovascular:  Negative for chest pain and palpitations.  Gastrointestinal:  Negative for nausea and vomiting.  Musculoskeletal:  Negative for back pain, joint pain and myalgias.  Neurological:  Negative for dizziness and headaches.  Psychiatric/Behavioral:  The patient is not nervous/anxious.   All other systems reviewed and are negative.   Past Medical History:  Diagnosis Date   AF (atrial fibrillation) (HCC)  Alcohol abuse    Angina pectoris    CAD (coronary artery disease) 12/15/2004   COPD (chronic obstructive pulmonary disease) (HCC)    Coronary artery disease    Heavy cigarette smoker    HTN (hypertension)    Idiopathic cardiomyopathy (HCC) 07/16/2009   Non compliance with medical treatment    Seizures [facility])     Past Surgical History:  Procedure Laterality Date   CARDIAC CATHETERIZATION  2006   CARDIAC CATHETERIZATION  12/2004   Left    CARDIOVERSION  01/21/2011   Procedure: CARDIOVERSION;  Surgeon: Elaina Graver., MD;  Location: [facility] OR;  Service: Cardiovascular;  Laterality: N/A;   DOPPLER ECHOCARDIOGRAPHY  02/2011   ELECTROPHYSIOLOGY STUDY N/A 12/16/2011   Procedure: ELECTROPHYSIOLOGY STUDY;  Surgeon: Tammie Fall, MD;  Location[facility] CATH LAB;   Service: Cardiovascular;  Laterality: N/A;   EP IMPLANTABLE DEVICE N/A 08/24/2015   Procedure: Loop Recorder Removal;  Surgeon: Tammie Fall, MD;  Location: [facility] INVASIVE CV LAB;  Service: Cardiovascular;  Laterality: N/A;   fx collar bone     NO PAST SURGERIES       reports that he has been smoking cigarettes. He has a 58 pack-year smoking history. He has never used smokeless tobacco. He reports current alcohol use. He reports current drug use. Drug: Marijuana.  No Known Allergies  Family History  Problem Relation Age of Onset   Suicidality Sister    CVA Mother    Stroke Mother    Heart disease Father    Coronary artery disease Father    COPD Father     Prior to Admission medications   Medication Sig Start Date End Date Taking? Authorizing Provider  albuterol  (PROVENTIL ) (2.5 MG/3ML) 0.083% nebulizer solution Take 3 mLs (2.5 mg total) by nebulization every 4 (four) hours as needed for wheezing or shortness of breath. 05/16/23   Ghimire, Estil Heman, MD  aspirin  EC 81 MG tablet Take 1 tablet (81 mg total) by mouth daily. Swallow whole. 12/08/22   Ghimire, Estil Heman, MD  ELIQUIS  5 MG TABS tablet Take 1 tablet (5 mg total) by mouth 2 (two) times daily. 05/16/23   Ghimire, Estil Heman, MD  fluticasone  furoate-vilanterol (BREO ELLIPTA ) 100-25 MCG/ACT AEPB Inhale 1 puff into the lungs daily. 05/16/23   Ghimire, Estil Heman, MD  folic acid  (FOLVITE ) 1 MG tablet Take 1 tablet (1 mg total) by mouth daily. 05/16/23   Ghimire, Estil Heman, MD  gabapentin  (NEURONTIN ) 400 MG capsule Take 1 capsule (400 mg total) by mouth daily as needed (for nerve pain). 05/16/23   Ghimire, Estil Heman, MD  linaclotide  (LINZESS ) 145 MCG CAPS capsule Take 1 capsule (145 mcg total) by mouth daily before breakfast. 05/16/23   Ghimire, Estil Heman, MD  metoprolol  succinate (TOPROL -XL) 50 MG 24 hr tablet Take 1 tablet (50 mg total) by mouth daily. Take with or immediately following a meal. 05/16/23   Ghimire, Estil Heman, MD  OXYGEN  Inhale 3 L  into the lungs continuous.     [provider]  pantoprazole  (PROTONIX ) 40 MG tablet Take 1 tablet (40 mg total) by mouth daily as needed (acid reflux). 05/16/23   Ghimire, Estil Heman, MD  polyethylene glycol (MIRALAX  / GLYCOLAX ) 17 g packet Take 17 g by mouth daily. Patient taking differently: Take 17 g by mouth daily as needed for mild constipation. 03/15/19   Zoanne Hinders, MD  predniSONE  (DELTASONE ) 10 MG tablet Take by mouth: 4 tablets (40mg  total) daily for 1 day,  then 3 tablets (30mg  total) daily for 1 day, then 2 tablets (20mg  total) daily for 1 day, then 1 tablet (10mg  total) daily for 1 day, then stop. 05/21/23   Ghimire, Estil Heman, MD  senna (SENOKOT) 8.6 MG TABS tablet Take 1-2 tablets (8.6-17.2 mg total) by mouth daily. Patient taking differently: Take 1-2 tablets by mouth daily as needed for moderate constipation. 03/15/19   Zoanne Hinders, MD  SPIRIVA  RESPIMAT 2.5 MCG/ACT AERS Inhale 2 puffs into the lungs daily. 05/16/23   Ghimire, Estil Heman, MD  traZODone  (DESYREL ) 50 MG tablet Take 1 tablet (50 mg total) by mouth at bedtime as needed for sleep. Patient taking differently: Take 50 mg by mouth at bedtime. 05/16/23   Ghimire, Estil Heman, MD  VENTOLIN  HFA 108 (90 Base) MCG/ACT inhaler Inhale 1-2 puffs into the lungs every 6 (six) hours as needed for wheezing or shortness of breath 05/16/23   Burton Casey, MD     Physical Exam: Vitals:   06/03/23 2348 06/03/23 2350 06/04/23 0003 06/04/23 0015  BP:   103/74 103/74  Pulse:   99 94  Resp:   20 20  Temp:  98.2 F (36.8 C)    TempSrc:  Temporal    SpO2:   93% 98%  Weight: 66.2 kg     Height: 5\' 11"  (1.803 m)       Physical Exam Vitals and nursing note reviewed.  Constitutional:      Appearance: He is ill-appearing.  Cardiovascular:     Rate and Rhythm: Normal rate and regular rhythm.  Pulmonary:     Effort: No accessory muscle usage or respiratory distress.     Breath sounds: No stridor. Examination of the right-upper field  reveals decreased breath sounds. Examination of the left-upper field reveals decreased breath sounds. Examination of the right-middle field reveals decreased breath sounds and wheezing. Examination of the left-middle field reveals decreased breath sounds and wheezing. Examination of the right-lower field reveals decreased breath sounds and wheezing. Examination of the left-lower field reveals decreased breath sounds and wheezing. Decreased breath sounds and wheezing present. No rhonchi or rales.  Musculoskeletal:     Cervical back: Neck supple.     Right lower leg: No edema.     Left lower leg: No edema.  Skin:    General: Skin is dry.     Capillary Refill: Capillary refill takes less than 2 seconds.     Coloration: Skin is cyanotic.  Neurological:     Mental Status: He is alert and oriented to person, place, and time.  Psychiatric:        Mood and Affect: Mood normal. Mood is not anxious.      Labs on Admission: I have personally reviewed following labs and imaging studies  CBC: Recent Labs  Lab 06/03/23 2346 06/03/23 2355 06/04/23 0110  WBC 6.3  --   --   NEUTROABS 4.7  --   --   HGB 13.1 13.9 14.3  HCT 42.4 41.0 42.0  MCV 101.7*  --   --   PLT 141*  --   --    Basic Metabolic Panel: Recent Labs  Lab 06/03/23 2355 06/04/23 0110 06/04/23 0118  NA 136 136 138  K 4.0 3.7 3.7  CL  --   --  89*  CO2  --   --  38*  GLUCOSE  --   --  141*  BUN  --   --  10  CREATININE  --   --  0.94  CALCIUM   --   --  8.8*   GFR: Estimated Creatinine Clearance: 67.5 mL/min (by C-G formula based on SCr of 0.94 mg/dL). Liver Function Tests: Recent Labs  Lab 06/04/23 0118  AST 17  ALT 19  ALKPHOS 55  BILITOT 0.7  PROT 6.2*  ALBUMIN  3.3*   No results for input(s): "LIPASE", "AMYLASE" in the last 168 hours. No results for input(s): "AMMONIA" in the last 168 hours. Coagulation Profile: Recent Labs  Lab 06/03/23 2346  INR 1.4*   Cardiac Enzymes: Recent Labs  Lab 06/03/23 2346   TROPONINIHS 8   BNP (last 3 results) Recent Labs    05/18/23 0650 05/19/23 0437 06/03/23 2346  BNP 84.5 60.8 115.9*   HbA1C: No results for input(s): "HGBA1C" in the last 72 hours. CBG: No results for input(s): "GLUCAP" in the last 168 hours. Lipid Profile: No results for input(s): "CHOL", "HDL", "LDLCALC", "TRIG", "CHOLHDL", "LDLDIRECT" in the last 72 hours. Thyroid  Function Tests: No results for input(s): "TSH", "T4TOTAL", "FREET4", "T3FREE", "THYROIDAB" in the last 72 hours. Anemia Panel: No results for input(s): "VITAMINB12", "FOLATE", "FERRITIN", "TIBC", "IRON", "RETICCTPCT" in the last 72 hours. Urine analysis:    Component Value Date/Time   COLORURINE AMBER (A) 05/06/2023 1815   APPEARANCEUR HAZY (A) 05/06/2023 1815   LABSPEC 1.017 05/06/2023 1815   PHURINE 5.0 05/06/2023 1815   GLUCOSEU 50 (A) 05/06/2023 1815   HGBUR SMALL (A) 05/06/2023 1815   BILIRUBINUR NEGATIVE 05/06/2023 1815   KETONESUR 5 (A) 05/06/2023 1815   PROTEINUR 100 (A) 05/06/2023 1815   UROBILINOGEN 0.2 12/08/2012 1516   NITRITE NEGATIVE 05/06/2023 1815   LEUKOCYTESUR NEGATIVE 05/06/2023 1815    Radiological Exams on Admission: I have personally reviewed images DG Chest Portable 1 View Result Date: 06/04/2023 CLINICAL DATA:  Shortness of breath. EXAM: PORTABLE CHEST 1 VIEW COMPARISON:  May 18, 2023 FINDINGS: The heart size and mediastinal contours are within normal limits. There is mild to moderate severity calcification of the aortic arch. The lungs are hyperinflated. There is no evidence of acute infiltrate. A very small right pleural effusion is suspected. No pneumothorax is identified. Multilevel degenerative changes seen throughout the thoracic spine. IMPRESSION: Emphysematous lung disease with a very small, stable right pleural effusion. Electronically Signed   By: Virgle Grime M.D.   On: 06/04/2023 00:21     EKG: My personal interpretation of EKG shows: EKG showing atrial fibrillation  heart rate 101.  There is no history of abnormality.    Assessment/Plan: Principal Problem:   COPD with acute exacerbation (HCC) Active Problems:   Acute on chronic respiratory failure with hypoxia and hypercapnia (HCC)   Chronic systolic CHF (congestive heart failure) (HCC)   Essential hypertension   Continuous dependence on cigarette smoking   Chronic hypoxic respiratory failure (HCC)   History of pulmonary embolism   Atrial fibrillation, chronic (HCC)   Chronic pain syndrome   Insomnia   Thrombocytopenia (HCC)    Assessment and Plan: COPD with acute exacerbation Acute on chronic hypoxic respiratory failure Acute hypercarbic respiratory failure -Patient present emergency department complaining of respiratory distress/shortness of breath which is not improving with DuoNeb nebulizer at home.  EMS found O2 sat 80% room air and patient declined BiPAP initially. - At presentation to ED patient has been placed on BiPAP.  VBG showed respiratory acidosis pH 7.2, elevated pCO2 87, low PO285 and And bicarb 43.  Respiratory acidosis, hypoxic and hypercarbic respiratory failure. -CBC and CMP unremarkable. - Chest x-ray showing emphysematous  change and a stable right pleural effusion. - Patient has been recently hospitalized and discharged 2 weeks ago for COPD and CHF exacerbation.   - Given patient is retaining pCO2 and having significant respiratory acidosis plan to continue BiPAP overnight.  Will repeat ABG in 6 hours. - Checking respiratory panel and nasal MRSA swab. - In the ED patient has been treated with Lasix  40 mg, Mobilizer Solu-Medrol  125 mg and Maxipime  2 g. - Plan to continue Solu-Medrol  60 mg twice daily, DuoNeb every 6 hour scheduled, budesonide  nebulizer twice daily and DuoNeb as needed.  Starting IV azithromycin  500 mg for 5 days. - Continue BiPAP.  Based on the ABG level around 5 AM can decide continuation of BiPAP versus transition to high flow oxygen .  Continue aspiration  precaution. - Admitting patient to progressive unit.  Combined CHF reduced EF 35 to 40% Essential hypertension - Since discharge from the hospital patient has not been taking Lasix .  In the ED patient has been given Lasix  IV 40 mg.  Given blood pressure is borderline soft and patient is evolving holding further IV diuretics. -Due to low blood pressure limiting GDMT guided medication at this time.  Currently on ToprolXL for the management of A-fib. On discharge need to refer to cardiology for further management of care.  Chronic smoking cigarette -Unfortunately patient continues to smoke cigarettes.  Continue nicotine  patch.  Thrombocytopenia - Low platelet count 141. At baseline patient has normal platelet count.  No evidence of bleeding.  Given patient is on Eliquis  continue to monitor for platelet count and mucosal bleeding.  History of pulmonary embolism -Continue Eliquis  5 mg twice daily  History of A-fib  -EKG showing atrial fibrillation heart rate 101.  Continue Eliquis  5 mg twice daily and Toprol -XL 50 mg daily. -Continue cardiac monitor for development of A-fib RVR.  Insomnia Holding trazodone  as patient is retaining pCO2 and currently on BiPAP.  Peripheral neuropathy Holding gabapentin  as patient is returning pCO2.   DVT prophylaxis:  Eliquis  Code Status:  Full Code Diet: N.p.o. as on BiPAP.  Continue aspiration precaution Family Communication:   Family was present at bedside, at the time of interview. Opportunity was given to ask question and all questions were answered satisfactorily.  Disposition Plan: Continue BiPAP and will follow-up with repeat ABG in the morning to decide transition to high flow oxygen  versus continue BiPAP Consults: Respiratory care Admission status:   Inpatient, Step Down Unit  Severity of Illness: The appropriate patient status for this patient is INPATIENT. Inpatient status is judged to be reasonable and necessary in order to provide the  required intensity of service to ensure the patient's safety. The patient's presenting symptoms, physical exam findings, and initial radiographic and laboratory data in the context of their chronic comorbidities is felt to place them at high risk for further clinical deterioration. Furthermore, it is not anticipated that the patient will be medically stable for discharge from the hospital within 2 midnights of admission.   * I certify that at the point of admission it is my clinical judgment that the patient will require inpatient hospital care spanning beyond 2 midnights from the point of admission due to high intensity of service, high risk for further deterioration and high frequency of surveillance required.Aaron Aas    Marisa Hage, MD [facility]  How to contact the [facility] Attending or Consulting provider 7A - 7P or covering provider during after hours 7P -7A, for this patient.  Check the care team in [facility] and look  for a) attending/consulting [facility] provider listed and b) the [facility] team listed Log into [email or web address] and use 's universal password to access. If you do not have the password, please contact the hospital operator. Locate the [facility] provider you are looking for under [facility] and page to a number that you can be directly reached. If you still have difficulty reaching the provider, please page the [facility] (Director on Call) for the Hospitalists listed on amion for assistance.  06/04/2023, 1:56 AM

## 2023-06-04 NOTE — Progress Notes (Addendum)
 Hypotension History of CHF with reduced EF 35 to 40% Patient is euvolemic on physical exam.  BNP slightly elevated 115 and chest x-ray evidence is no evidence of pulmonary edema.  In ED patient has been received Lasix  40 mg IV.  Currently blood pressure has been trended down to 80/57.  Giving NS to 250 mL of bolus and albumin  25 g stat.  History of paroxysmal atrial fibrillation Due to low blood pressure decreasing Toprol -XL 50 mg to 12.5 mg.  Can increase the dose based on the blood pressure trend.  Afshin Chrystal, MD [facility] 06/04/2023, 2:50 AM      Addendum patient's blood pressure still soft 76/34 and MAP 48 even with albumin  bolus and 250 mL of NS bolus.  Giving 500 mL of NS bolus, and another dose of albumin  25 g.  Unable to give oral midodrine  as patient is on BiPAP and high risk of aspiration.  Atrial fibrillation rate controlled 60.  In the setting of hypotension holding Toprol -XL.

## 2023-06-04 NOTE — Progress Notes (Signed)
 PROGRESS NOTE    Johnny Rodriguez  ZOX:[id number] DOB: 1950/10/25 DOA: 06/03/2023 PCP: Pcp, No  Subjective: Pt seen and examined. Still on bipap. Has been on bipap since about MN. Stable but unimproving blood gases.pH 7.3, PCO2 80s.  Pt is awake and alert. Wanting to take off Bipap mask.  In March 2025, he required endotracheal intubation due to obtunded mental status with pH 7.185, PCO2 88   Hospital Course: HPI: Johnny Rodriguez is a 73 y.o. 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  EMS found the patient O2 sat 80% on 2 L oxygen  however patient refused CPAP or patient has been recently admitted and discharge on 3/26 to 4/10 for acute hypoxic respiratory failure and exacerbation and CHF exacerbation.   During my evaluation at the bedside patient is on BiPAP.  He has been reporting cough and progressive worsening shortness of breath.  Denies any fever, chill, chest pain, and palpitation.  Denies any lower extremities swelling.  Denies any recent known sick contact..  No other complaint at this time.     ED Course:  At presentation to ED O2 sat 93 to 98% on BiPAP.  Otherwise hemodynamically stable. VBG showing pH 7.2, pCO2 87, pO2 65, bicarb 14. Normal lactic acid level 1.5. Elevated pro time INR 17 and 1.4. Normal troponin. Slight elevated BNP around 115.9. CBC unremarkable except low platelet count 141. Pending CMP.   Chest x-ray emphysema lung with a small stable right pleural effusion.   In the patient has been treated with Lasix  40 mg, methylprednisolone , DuoNeb and mag sulfate 2 g.   Hospitalist has been consulted for further evaluation management of acute on chronic hypoxic, hypercarbic  respiratory failure and COPD exacerbation.  Significant Events: Admitted 06/03/2023 for COPD exacerbation   Significant Labs: Abg pH 7.27, PCO2 of 87, PO2 of 65 Lactic acid 1.6 WBC 6.3, HgB 13.1, Plt 141 BNP 115 INR 1.4 Na 138, K 3.7, CO2 of 38, BUN 10, Scr 0.94 RVP negative for 20 pathogens  Significant Imaging Studies: CXR Emphysematous lung disease with a very small, stable right pleural effusion.  Antibiotic Therapy: Anti-infectives (From admission, onward)    Start     Dose/Rate Route Frequency Ordered Stop   06/04/23 0200  azithromycin  (ZITHROMAX ) 500 mg in sodium chloride  0.9 % 250 mL IVPB        500 mg 250 mL/hr over 60 Minutes Intravenous Every 24 hours 06/04/23 0125 06/09/23 0159       Procedures:   Consultants:     Assessment and Plan: * COPD with acute exacerbation (HCC) 06-04-2023 continue with IV solumedrol, duonebs q6h, zithromax   Acute on chronic respiratory failure with hypoxia and hypercapnia (HCC) 06-04-2023 currently on bipap. pH and PCO2 have plateaued and not improved for several hours. Repeat ABG at 9 am.  pH 7.3 and PO2 around 80. Pt is awake and alert. Do not think he needs intubation at this point. Will need to monitor him closely.   Insomnia 06-04-2023 chronic.  Chronic pain syndrome 06-04-2023 on neurontin  as outpatient. Not getting any prescribed opiates.  Atrial fibrillation, chronic (HCC) 06-04-2023 chronically on Toprol -XL for rate control. Holding betablocker for now due to hypotension from PPV.  History of pulmonary embolism 06-04-2023 on eliquis .  Chronic hypoxic respiratory  failure (HCC) - chronically on 3 L/min continuously 06-04-2023 due to hypercapnia. Continue supplemental O2 to keep sats 88-92%.  Continuous dependence on cigarette smoking 06-04-2023 pt denies smoking since discharge 05-21-2023. Not sure I believe him.  Long term current use of anticoagulant therapy 06-04-2023 due to hx of PE and chronic afib. On  Eliquis .  Essential hypertension 06-04-2023 hold his HTN for now due to low BP.  Chronic systolic CHF (congestive heart failure) (HCC) 06-04-2023 not exacerbated. Actually a bit on the dry side. Needed IVF and IV albumin  due to hypotension. Likely due to PPV.  DVT prophylaxis: Place and maintain sequential compression device Start: 06/04/23 0157 SCDs Start: 06/04/23 0126 Place TED hose Start: 06/04/23 0126 apixaban  (ELIQUIS ) tablet 5 mg     Code Status: Full Code Family Communication: no family at bedside Disposition Plan: return home Reason for continuing need for hospitalization: remains on Bipap, IV steroids, IV abx.  Objective: Vitals:   06/04/23 0600 06/04/23 0700 06/04/23 0715 06/04/23 0730  BP: (!) 93/57 (!) 85/58 (!) 86/62 103/68  Pulse: 78 71 67 60  Resp: 16 18 15 17   Temp:      TempSrc:      SpO2: 97% 99% 99% 100%  Weight:      Height:        Intake/Output Summary (Last 24 hours) at 06/04/2023 0805 Last data filed at 06/04/2023 0745 Gross per 24 hour  Intake 297.54 ml  Output 700 ml  Net -402.46 ml   Filed Weights   06/03/23 2348  Weight: 66.2 kg    Examination:  Physical Exam Vitals and nursing note reviewed.  Constitutional:      General: He is not in acute distress.    Appearance: He is not toxic-appearing.     Comments: Chronically ill appearing  HENT:     Head: Normocephalic and atraumatic.  Eyes:     General: No scleral icterus. Cardiovascular:     Rate and Rhythm: Normal rate. Rhythm irregular.  Pulmonary:     Comments: Poor air movement On bipap Awake and alert Abdominal:     General: Bowel sounds are normal.     Palpations: Abdomen is soft.  Musculoskeletal:     Right lower leg: No edema.     Left lower leg: No edema.  Skin:    General: Skin is warm and dry.     Capillary Refill: Capillary refill takes less than 2 seconds.  Neurological:     General: No focal deficit present.     Mental Status: He is oriented to person, place,  and time.     Data Reviewed: I have personally reviewed following labs and imaging studies  CBC: Recent Labs  Lab 06/03/23 2346 06/03/23 2355 06/04/23 0110 06/04/23 0326 06/04/23 0347  WBC 6.3  --   --  5.4  --   NEUTROABS 4.7  --   --   --   --   HGB 13.1 13.9 14.3 12.9* 13.9  HCT 42.4 41.0 42.0 41.2 41.0  MCV 101.7*  --   --  101.7*  --   PLT 141*  --   --  135*  --    Basic Metabolic Panel: Recent Labs  Lab 06/03/23 2355 06/04/23 0110 06/04/23 0118 06/04/23 0326 06/04/23 0347  NA 136 136 138 137 135  K 4.0 3.7 3.7 3.8 3.8  CL  --   --  89* 93*  --   CO2  --   --  38* 33*  --  GLUCOSE  --   --  141* 162*  --   BUN  --   --  10 10  --   CREATININE  --   --  0.94 0.92  --   CALCIUM   --   --  8.8* 8.3*  --    GFR: Estimated Creatinine Clearance: 69 mL/min (by C-G formula based on SCr of 0.92 mg/dL). Liver Function Tests: Recent Labs  Lab 06/04/23 0118 06/04/23 0326  AST 17 18  ALT 19 17  ALKPHOS 55 52  BILITOT 0.7 0.7  PROT 6.2* 5.9*  ALBUMIN  3.3* 3.0*   Coagulation Profile: Recent Labs  Lab 06/03/23 2346  INR 1.4*   BNP (last 3 results) Recent Labs    05/18/23 0650 05/19/23 0437 06/03/23 2346  BNP 84.5 60.8 115.9*   CBG: Recent Labs  Lab 06/04/23 0322 06/04/23 0731  GLUCAP 146* 151*   Sepsis Labs: Recent Labs  Lab 06/04/23 0008  LATICACIDVEN 1.5    Recent Results (from the past 240 hours)  Respiratory (~20 pathogens) panel by PCR     Status: None   Collection Time: 06/04/23  2:49 AM   Specimen: Nasopharyngeal Swab; Respiratory  Result Value Ref Range Status   Adenovirus NOT DETECTED NOT DETECTED Final   Coronavirus 229E NOT DETECTED NOT DETECTED Final    Comment: (NOTE) The Coronavirus on the Respiratory Panel, DOES NOT test for the novel  Coronavirus (2019 nCoV)    Coronavirus HKU1 NOT DETECTED NOT DETECTED Final   Coronavirus NL63 NOT DETECTED NOT DETECTED Final   Coronavirus OC43 NOT DETECTED NOT DETECTED Final    Metapneumovirus NOT DETECTED NOT DETECTED Final   Rhinovirus / Enterovirus NOT DETECTED NOT DETECTED Final   Influenza A NOT DETECTED NOT DETECTED Final   Influenza B NOT DETECTED NOT DETECTED Final   Parainfluenza Virus 1 NOT DETECTED NOT DETECTED Final   Parainfluenza Virus 2 NOT DETECTED NOT DETECTED Final   Parainfluenza Virus 3 NOT DETECTED NOT DETECTED Final   Parainfluenza Virus 4 NOT DETECTED NOT DETECTED Final   Respiratory Syncytial Virus NOT DETECTED NOT DETECTED Final   Bordetella pertussis NOT DETECTED NOT DETECTED Final   Bordetella Parapertussis NOT DETECTED NOT DETECTED Final   Chlamydophila pneumoniae NOT DETECTED NOT DETECTED Final   Mycoplasma pneumoniae NOT DETECTED NOT DETECTED Final    Comment: Performed at [facility], [address] [place], [place] [address]  MRSA Next Gen by PCR, Nasal     Status: None   Collection Time: 06/04/23  2:49 AM   Specimen: Nasopharyngeal Swab; Nasal Swab  Result Value Ref Range Status   MRSA by PCR Next Gen NOT DETECTED NOT DETECTED Final    Comment: (NOTE) The GeneXpert MRSA Assay (FDA approved for NASAL specimens only), is one component of a comprehensive MRSA colonization surveillance program. It is not intended to diagnose MRSA infection nor to guide or monitor treatment for MRSA infections. Test performance is not FDA approved in patients less than 21 years old. Performed at [facility], [address] [place], [place] [address]      Radiology Studies: DG Chest Portable 1 View Result Date: 06/04/2023 CLINICAL DATA:  Shortness of breath. EXAM: PORTABLE CHEST 1 VIEW COMPARISON:  May 18, 2023 FINDINGS: The heart size and mediastinal contours are within normal limits. There is mild to moderate severity calcification of the aortic arch. The lungs are hyperinflated. There is no evidence of acute infiltrate. A very small right pleural effusion is  suspected. No pneumothorax is identified. Multilevel degenerative  changes seen throughout the thoracic spine. IMPRESSION: Emphysematous lung disease with a very small, stable right pleural effusion. Electronically Signed   By: Virgle Grime M.D.   On: 06/04/2023 00:21    Scheduled Meds:  apixaban   5 mg Oral BID   aspirin  EC  81 mg Oral Daily   budesonide  (PULMICORT ) nebulizer solution  0.25 mg Nebulization BID   ipratropium-albuterol   3 mL Nebulization Q6H   methylPREDNISolone  (SOLU-MEDROL ) injection  60 mg Intravenous Q12H   nicotine   21 mg Transdermal Daily   sodium chloride  flush  3 mL Intravenous Q12H   Continuous Infusions:  sodium chloride      azithromycin  500 mg (06/04/23 0247)     LOS: 0 days   Time spent: 50 minutes  Unk Garb, DO  [facility]  06/04/2023, 8:05 AM

## 2023-06-04 NOTE — ED Notes (Signed)
 Pt is now on 2L, Bipap removed

## 2023-06-04 NOTE — Assessment & Plan Note (Addendum)
 06-04-2023 on neurontin  as outpatient. Not getting any prescribed opiates.  06-05-2023 stable on neurontin .  06-06-2023 stable.

## 2023-06-04 NOTE — ED Notes (Signed)
 Pt expressed frustration of still being on the bipap mask; explained to patient that he may need to stay on it a while longer due to recent ABG.

## 2023-06-04 NOTE — Progress Notes (Signed)
 [facility] Liaison Note               [facility])   This patient is enrolled in the Home Based Primary Care program of [facility].   Hospital Liaison Team will follow for disposition.   Please reach out if there are questions or concerns.   Madelene Schanz, BSN, RN, [facility]  [phone]

## 2023-06-04 NOTE — Assessment & Plan Note (Addendum)
 06-04-2023 due to hx of PE and chronic afib. On Eliquis .  06-05-2023 continue eliquis   06-06-2023 stable.

## 2023-06-04 NOTE — Assessment & Plan Note (Addendum)
 06-04-2023 not exacerbated. Actually a bit on the dry side. Needed IVF and IV albumin  due to hypotension. Likely due to PPV.  06-05-2023 stable. Not exacerbated. Last echo 05-2023 LVEF 35 to 40%   06-06-2023 stable.

## 2023-06-04 NOTE — Assessment & Plan Note (Addendum)
 06-04-2023 chronically on Toprol -XL for rate control. Holding betablocker for now due to hypotension from PPV.  06-05-2023 HR controlled. BP too low to restart Toprol -XL.  06-06-2023 stable. Decrease home dose of Toprol -XL to 25 mg at bedtime. Change to at bedtime dosing to avoid hypotension during the day.

## 2023-06-04 NOTE — ED Notes (Signed)
 Dr. Sundil made aware of pts bp

## 2023-06-04 NOTE — Progress Notes (Signed)
   06/04/23 2000  BiPAP/CPAP/SIPAP  Reason BIPAP/CPAP not in use Non-compliant (Pt states he will not wear BiPAP. He hates it and cannot breathe while wearing BiPAP.)  BiPAP/CPAP /SiPAP Vitals  Pulse Rate 89  Resp 13  SpO2 100 %  MEWS Score/Color  MEWS Score 1  MEWS Score Color Green

## 2023-06-04 NOTE — ED Notes (Signed)
 Patient assisted to side of bed and used urinal. Back in bed with railings up and call light within reach.

## 2023-06-04 NOTE — Assessment & Plan Note (Addendum)
 06-04-2023 on eliquis .  06-06-2023 stable.

## 2023-06-04 NOTE — Assessment & Plan Note (Addendum)
 06-04-2023 due to hypercapnia. Continue supplemental O2 to keep sats 88-92%.  06-05-2023 chronically on 3 L/min.  06-06-2023 continue 3 L/min. Keep O2 sats 88-92%.

## 2023-06-04 NOTE — Assessment & Plan Note (Addendum)
 06-04-2023 pt denies smoking since discharge 05-21-2023. Not sure I believe him.  06-05-2023 pt advised to stop smoking.  06-06-2023 Pt advised not to smoke any longer. Pt states "that ain't a problem anymore".

## 2023-06-04 NOTE — ED Notes (Signed)
 Pt calling out from room. RN found pt at end of stretcher unhooked from oxygen . Stretcher unlocked and oxygen  still attached to tank under bed. Pt's brief and sheets covered in urine. Pt cleaned and full linen changed.

## 2023-06-05 DIAGNOSIS — I482 Chronic atrial fibrillation, unspecified: Secondary | ICD-10-CM | POA: Diagnosis not present

## 2023-06-05 DIAGNOSIS — J441 Chronic obstructive pulmonary disease with (acute) exacerbation: Secondary | ICD-10-CM | POA: Diagnosis not present

## 2023-06-05 DIAGNOSIS — J9622 Acute and chronic respiratory failure with hypercapnia: Secondary | ICD-10-CM | POA: Diagnosis not present

## 2023-06-05 DIAGNOSIS — J9621 Acute and chronic respiratory failure with hypoxia: Secondary | ICD-10-CM | POA: Diagnosis not present

## 2023-06-05 LAB — GLUCOSE, CAPILLARY
Glucose-Capillary: 160 mg/dL — ABNORMAL HIGH (ref 70–99)
Glucose-Capillary: 162 mg/dL — ABNORMAL HIGH (ref 70–99)
Glucose-Capillary: 191 mg/dL — ABNORMAL HIGH (ref 70–99)

## 2023-06-05 LAB — CBC WITH DIFFERENTIAL/PLATELET
Abs Immature Granulocytes: 0.04 10*3/uL (ref 0.00–0.07)
Basophils Absolute: 0 10*3/uL (ref 0.0–0.1)
Basophils Relative: 0 %
Eosinophils Absolute: 0 10*3/uL (ref 0.0–0.5)
Eosinophils Relative: 0 %
HCT: 38.5 % — ABNORMAL LOW (ref 39.0–52.0)
Hemoglobin: 12.2 g/dL — ABNORMAL LOW (ref 13.0–17.0)
Immature Granulocytes: 1 %
Lymphocytes Relative: 5 %
Lymphs Abs: 0.4 10*3/uL — ABNORMAL LOW (ref 0.7–4.0)
MCH: 31.2 pg (ref 26.0–34.0)
MCHC: 31.7 g/dL (ref 30.0–36.0)
MCV: 98.5 fL (ref 80.0–100.0)
Monocytes Absolute: 0.2 10*3/uL (ref 0.1–1.0)
Monocytes Relative: 2 %
Neutro Abs: 7.5 10*3/uL (ref 1.7–7.7)
Neutrophils Relative %: 92 %
Platelets: 122 10*3/uL — ABNORMAL LOW (ref 150–400)
RBC: 3.91 MIL/uL — ABNORMAL LOW (ref 4.22–5.81)
RDW: 14.6 % (ref 11.5–15.5)
WBC: 8 10*3/uL (ref 4.0–10.5)
nRBC: 0 % (ref 0.0–0.2)

## 2023-06-05 LAB — BLOOD GAS, VENOUS
Acid-Base Excess: 15.7 mmol/L — ABNORMAL HIGH (ref 0.0–2.0)
Bicarbonate: 46.4 mmol/L — ABNORMAL HIGH (ref 20.0–28.0)
O2 Saturation: 49.1 %
Patient temperature: 36.7
pCO2, Ven: 89 mmHg (ref 44–60)
pH, Ven: 7.32 (ref 7.25–7.43)
pO2, Ven: 32 mmHg (ref 32–45)

## 2023-06-05 LAB — BASIC METABOLIC PANEL WITH GFR
Anion gap: 13 (ref 5–15)
BUN: 23 mg/dL (ref 8–23)
CO2: 31 mmol/L (ref 22–32)
Calcium: 8.8 mg/dL — ABNORMAL LOW (ref 8.9–10.3)
Chloride: 92 mmol/L — ABNORMAL LOW (ref 98–111)
Creatinine, Ser: 1.13 mg/dL (ref 0.61–1.24)
GFR, Estimated: >60 mL/min (ref >60)
Glucose, Bld: 167 mg/dL — ABNORMAL HIGH (ref 70–99)
Potassium: 5.1 mmol/L (ref 3.5–5.1)
Sodium: 136 mmol/L (ref 135–145)

## 2023-06-05 MED ORDER — IPRATROPIUM-ALBUTEROL 0.5-2.5 (3) MG/3ML IN SOLN
3.0000 mL | Freq: Three times a day (TID) | RESPIRATORY_TRACT | Status: DC
  Administered 2023-06-05 – 2023-06-06 (×4): 3 mL via RESPIRATORY_TRACT
  Filled 2023-06-05 (×4): qty 3

## 2023-06-05 MED ORDER — PREDNISONE 20 MG PO TABS
40.0000 mg | ORAL_TABLET | Freq: Every day | ORAL | Status: DC
  Administered 2023-06-05 – 2023-06-06 (×2): 40 mg via ORAL
  Filled 2023-06-05 (×2): qty 2

## 2023-06-05 NOTE — Progress Notes (Signed)
 Mobility Specialist Progress Note:    06/05/23 1028  Mobility  Activity Ambulated with assistance to bathroom;Ambulated with assistance in room;Ambulated with assistance in hallway  Level of Assistance Contact guard assist, steadying assist  Assistive Device Four wheel walker  Distance Ambulated (ft) 216 ft  Activity Response Tolerated well  Mobility Referral Yes  Mobility visit 1 Mobility  Mobility Specialist Start Time (ACUTE ONLY) 1011  Mobility Specialist Stop Time (ACUTE ONLY) 1028  Mobility Specialist Time Calculation (min) (ACUTE ONLY) 17 min   Pt received in bed, requesting assistance to bathroom. CGA with RW to stand and ambulate. Void successful. Returned to bed to recover from SOB. After several minutes, pt agreeable to ambulate in hallway with 4WW. SpO2 85-93% on 4L during session. Took standing rest breaks when appropriate and recovered. Max HR 125 bpm. Returned pt to room, sitting up in chair, left with all needs met.   Rayfield Beem Mobility Specialist Please contact via Special educational needs teacher or  Rehab office at [phone]

## 2023-06-05 NOTE — Progress Notes (Signed)
 PROGRESS NOTE    Johnny Rodriguez  XBJ:[id number] DOB: 03-07-1950 DOA: 06/03/2023 PCP: Pcp, No  Subjective: Pt seen and examined.  Pt refused Bipap last night.  VBG this AM shows pH 7.32, PCO2 of 89.  Pt is awake and alert. Already finished breakfast. Wants to know when he can go home.  Venous Blood Gas: Lab Results  Component Value Date/Time   PHVEN 7.32 06/05/2023 04:24 AM   PCO2VEN 89 (HH) 06/05/2023 04:24 AM   PO2VEN 32 06/05/2023 04:24 AM   HCO3 46.4 (H) 06/05/2023 04:24 AM   O2SAT 49.1 06/05/2023 04:24 AM      Hospital Course: HPI: Johnny Rodriguez is a 73 y.o. 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  EMS found the patient O2 sat 80% on 2 L oxygen  however patient refused CPAP or patient has been recently admitted and discharge on 3/26 to 4/10 for acute hypoxic respiratory failure and exacerbation and CHF exacerbation.   During my evaluation at the bedside patient is on BiPAP.  He has been reporting cough and progressive worsening shortness of breath.  Denies any fever, chill, chest pain, and palpitation.  Denies any lower extremities swelling.  Denies any recent known sick contact..  No other complaint at this time.     ED Course:  At presentation to ED O2 sat 93 to 98% on BiPAP.  Otherwise hemodynamically stable. VBG showing pH 7.2, pCO2 87, pO2 65, bicarb 14. Normal lactic acid level 1.5. Elevated pro time INR 17 and 1.4. Normal troponin. Slight elevated BNP around 115.9. CBC unremarkable except low platelet count 141. Pending CMP.   Chest x-ray emphysema lung with a small stable right pleural effusion.   In the patient has been treated with Lasix  40 mg, methylprednisolone ,  DuoNeb and mag sulfate 2 g.   Hospitalist has been consulted for further evaluation management of acute on chronic hypoxic, hypercarbic respiratory failure and COPD exacerbation.  Significant Events: Admitted 06/03/2023 for COPD exacerbation   Significant Labs: Abg pH 7.27, PCO2 of 87, PO2 of 65 Lactic acid 1.6 WBC 6.3, HgB 13.1, Plt 141 BNP 115 INR 1.4 Na 138, K 3.7, CO2 of 38, BUN 10, Scr 0.94 RVP negative for 20 pathogens  Significant Imaging Studies: CXR Emphysematous lung disease with a very small, stable right pleural effusion.  Antibiotic Therapy: Anti-infectives (From admission, onward)    Start     Dose/Rate Route Frequency Ordered Stop   06/04/23 0200  azithromycin  (ZITHROMAX ) 500 mg in sodium chloride  0.9 % 250 mL IVPB        500 mg 250 mL/hr over 60 Minutes Intravenous Every 24 hours 06/04/23 0125 06/09/23 0159       Procedures:   Consultants:     Assessment and Plan: * COPD with acute exacerbation (HCC) 06-04-2023 continue with IV solumedrol, duonebs q6h, zithromax   06-05-2023 baseline PCO2 around high 80s to low 90s on VBG. Pt refused bipap last night. Change to po prednisone  and monitor. Potential for DC tomorrow. Continue q6h nebs, po zithromax .  Acute on chronic respiratory failure with hypoxia and hypercapnia (HCC) - baseline PCO2 around high 80s to low 90s on VBG 06-04-2023 currently on bipap. pH and PCO2 have plateaued and not improved for several hours. Repeat ABG at 9 am.  pH 7.3 and PO2 around 80. Pt is awake and alert. Do not think he needs intubation at this point. Will need to monitor him closely.   06-05-2023 baseline PCO2 around high 80s to low 90s on VBG. Pt refused bipap last night. Change to po prednisone  and monitor. Potential for DC tomorrow.  Insomnia 06-04-2023 chronic.  06-05-2023 chronic.  Chronic pain syndrome 06-04-2023 on neurontin  as outpatient. Not getting any prescribed opiates.  06-05-2023 stable on  neurontin .  Atrial fibrillation, chronic (HCC) 06-04-2023 chronically on Toprol -XL for rate control. Holding betablocker for now due to hypotension from PPV.  06-05-2023 HR controlled. BP too low to restart Toprol -XL.  History of pulmonary embolism 06-04-2023 on eliquis .  Chronic hypoxic respiratory failure (HCC) - chronically on 3 L/min continuously, baseline PCO2 around high 80s to low 90s on VBG 06-04-2023 due to hypercapnia. Continue supplemental O2 to keep sats 88-92%.  06-05-2023 chronically on 3 L/min.  Continuous dependence on cigarette smoking 06-04-2023 pt denies smoking since discharge 05-21-2023. Not sure I believe him.  06-05-2023 pt advised to stop smoking.  Long term current use of anticoagulant therapy 06-04-2023 due to hx of PE and chronic afib. On Eliquis .  06-05-2023 continue eliquis   Essential hypertension 06-04-2023 hold his HTN for now due to low BP.  06-05-2023 BP stable off HTN meds.  Chronic systolic CHF (congestive heart failure) (HCC) 06-04-2023 not exacerbated. Actually a bit on the dry side. Needed IVF and IV albumin  due to hypotension. Likely due to PPV.  06-05-2023 stable. Not exacerbated. Last echo 05-2023 LVEF 35 to 40%    DVT prophylaxis: Place and maintain sequential compression device Start: 06/04/23 0157 SCDs Start: 06/04/23 0126 Place TED hose Start: 06/04/23 0126 apixaban  (ELIQUIS ) tablet 5 mg     Code Status: Full Code Family Communication: no family at bedside Disposition Plan: return home Reason for continuing need for hospitalization: change to po prednisone  today. Monitor overnight. Possible DC tomorrow.  Objective: Vitals:   06/04/23 2334 06/05/23 0319 06/05/23 0811 06/05/23 0848  BP: 106/80 (!) 101/59 105/70   Pulse: 80 71 100 83  Resp: 19 20 20    Temp: 98.1 F (36.7 C) 98.1 F (36.7 C) 98.3 F (36.8 C)   TempSrc: Oral Oral Oral   SpO2: 95% 98% 90% 98%  Weight:      Height:        Intake/Output Summary (Last 24  hours) at 06/05/2023 0947 Last data filed at 06/05/2023 0447 Gross per 24 hour  Intake --  Output 1200 ml  Net -1200 ml   Filed Weights   06/03/23 2348 06/04/23 2108  Weight: 66.2 kg 77.4 kg    Examination:  Physical Exam  Data Reviewed: I have personally reviewed following labs and imaging studies  CBC: Recent Labs  Lab 06/03/23 2346 06/03/23 2355 06/04/23 0110 06/04/23 0326 06/04/23 0347 06/04/23 1016 06/05/23 0424  WBC 6.3  --   --  5.4  --   --  8.0  NEUTROABS 4.7  --   --   --   --   --  7.5  HGB 13.1   < > 14.3 12.9* 13.9 12.9* 12.2*  HCT 42.4   < > 42.0 41.2 41.0 38.0* 38.5*  MCV 101.7*  --   --  101.7*  --   --  98.5  PLT 141*  --   --  135*  --   --  122*   < > = values in this interval not displayed.   Basic Metabolic  Panel: Recent Labs  Lab 06/04/23 0118 06/04/23 0326 06/04/23 0347 06/04/23 1016 06/05/23 0424  NA 138 137 135 137 136  K 3.7 3.8 3.8 4.5 5.1  CL 89* 93*  --   --  92*  CO2 38* 33*  --   --  31  GLUCOSE 141* 162*  --   --  167*  BUN 10 10  --   --  23  CREATININE 0.94 0.92  --   --  1.13  CALCIUM  8.8* 8.3*  --   --  8.8*   GFR: Estimated Creatinine Clearance: 63.9 mL/min (by C-G formula based on SCr of 1.13 mg/dL). Liver Function Tests: Recent Labs  Lab 06/04/23 0118 06/04/23 0326  AST 17 18  ALT 19 17  ALKPHOS 55 52  BILITOT 0.7 0.7  PROT 6.2* 5.9*  ALBUMIN  3.3* 3.0*   Coagulation Profile: Recent Labs  Lab 06/03/23 2346  INR 1.4*   BNP (last 3 results) Recent Labs    05/18/23 0650 05/19/23 0437 06/03/23 2346  BNP 84.5 60.8 115.9*   CBG: Recent Labs  Lab 06/04/23 0322 06/04/23 0731 06/04/23 1159 06/05/23 0020 06/05/23 0601  GLUCAP 146* 151* 135* 160* 191*   Sepsis Labs: Recent Labs  Lab 06/04/23 0008  LATICACIDVEN 1.5   Recent Labs    06/03/23 2355 06/04/23 0110 06/04/23 0347 06/04/23 1016 06/05/23 0424  PHVEN  --   --   --   --  7.32  PCO2VEN  --   --   --   --  89*  PO2VEN  --   --   --    --  32  HCO3 40.7* 43.5* 43.4* 41.2* 46.4*  O2SAT 88 90 92 97 49.1      Recent Results (from the past 240 hours)  Respiratory (~20 pathogens) panel by PCR     Status: None   Collection Time: 06/04/23  2:49 AM   Specimen: Nasopharyngeal Swab; Respiratory  Result Value Ref Range Status   Adenovirus NOT DETECTED NOT DETECTED Final   Coronavirus 229E NOT DETECTED NOT DETECTED Final    Comment: (NOTE) The Coronavirus on the Respiratory Panel, DOES NOT test for the novel  Coronavirus (2019 nCoV)    Coronavirus HKU1 NOT DETECTED NOT DETECTED Final   Coronavirus NL63 NOT DETECTED NOT DETECTED Final   Coronavirus OC43 NOT DETECTED NOT DETECTED Final   Metapneumovirus NOT DETECTED NOT DETECTED Final   Rhinovirus / Enterovirus NOT DETECTED NOT DETECTED Final   Influenza A NOT DETECTED NOT DETECTED Final   Influenza B NOT DETECTED NOT DETECTED Final   Parainfluenza Virus 1 NOT DETECTED NOT DETECTED Final   Parainfluenza Virus 2 NOT DETECTED NOT DETECTED Final   Parainfluenza Virus 3 NOT DETECTED NOT DETECTED Final   Parainfluenza Virus 4 NOT DETECTED NOT DETECTED Final   Respiratory Syncytial Virus NOT DETECTED NOT DETECTED Final   Bordetella pertussis NOT DETECTED NOT DETECTED Final   Bordetella Parapertussis NOT DETECTED NOT DETECTED Final   Chlamydophila pneumoniae NOT DETECTED NOT DETECTED Final   Mycoplasma pneumoniae NOT DETECTED NOT DETECTED Final    Comment: Performed at [facility], [address] [place], [place] [address]  MRSA Next Gen by PCR, Nasal     Status: None   Collection Time: 06/04/23  2:49 AM   Specimen: Nasopharyngeal Swab; Nasal Swab  Result Value Ref Range Status   MRSA by PCR Next Gen NOT DETECTED NOT DETECTED Final    Comment: (NOTE) The GeneXpert MRSA  Assay (FDA approved for NASAL specimens only), is one component of a comprehensive MRSA colonization surveillance program. It is not intended to diagnose MRSA infection nor to guide or monitor  treatment for MRSA infections. Test performance is not FDA approved in patients less than 58 years old. Performed at [facility], [address] [place], [place] [address]      Radiology Studies: DG Chest Portable 1 View Result Date: 06/04/2023 CLINICAL DATA:  Shortness of breath. EXAM: PORTABLE CHEST 1 VIEW COMPARISON:  May 18, 2023 FINDINGS: The heart size and mediastinal contours are within normal limits. There is mild to moderate severity calcification of the aortic arch. The lungs are hyperinflated. There is no evidence of acute infiltrate. A very small right pleural effusion is suspected. No pneumothorax is identified. Multilevel degenerative changes seen throughout the thoracic spine. IMPRESSION: Emphysematous lung disease with a very small, stable right pleural effusion. Electronically Signed   By: Virgle Grime M.D.   On: 06/04/2023 00:21    Scheduled Meds:  apixaban   5 mg Oral BID   aspirin  EC  81 mg Oral Daily   azithromycin   500 mg Oral Daily   budesonide  (PULMICORT ) nebulizer solution  0.25 mg Nebulization BID   ipratropium-albuterol   3 mL Nebulization TID   nicotine   21 mg Transdermal Daily   predniSONE   40 mg Oral Q breakfast   Continuous Infusions:   LOS: 1 day   Time spent: 50 minutes  Unk Garb, DO  [facility]  06/05/2023, 9:47 AM

## 2023-06-05 NOTE — Plan of Care (Signed)
  Problem: Education: Goal: Knowledge of General Education information will improve Description: Including pain rating scale, medication(s)/side effects and non-pharmacologic comfort measures Outcome: Progressing   Problem: Clinical Measurements: Goal: Ability to maintain clinical measurements within normal limits will improve Outcome: Progressing pt anticipates discharge tomorrow Goal: Will remain free from infection Outcome: Progressing

## 2023-06-05 NOTE — Progress Notes (Signed)
   06/05/23 2334  BiPAP/CPAP/SIPAP  Reason BIPAP/CPAP not in use Non-compliant  BiPAP/CPAP /SiPAP Vitals  Pulse Rate (!) 110  Resp 20  SpO2 93 %  Bilateral Breath Sounds Clear;Diminished  MEWS Score/Color  MEWS Score 1  MEWS Score Color Marrie Sizer

## 2023-06-06 DIAGNOSIS — J9621 Acute and chronic respiratory failure with hypoxia: Secondary | ICD-10-CM | POA: Diagnosis not present

## 2023-06-06 DIAGNOSIS — J9622 Acute and chronic respiratory failure with hypercapnia: Secondary | ICD-10-CM | POA: Diagnosis not present

## 2023-06-06 DIAGNOSIS — J441 Chronic obstructive pulmonary disease with (acute) exacerbation: Secondary | ICD-10-CM | POA: Diagnosis not present

## 2023-06-06 DIAGNOSIS — I482 Chronic atrial fibrillation, unspecified: Secondary | ICD-10-CM | POA: Diagnosis not present

## 2023-06-06 LAB — BASIC METABOLIC PANEL WITH GFR
Anion gap: 11 (ref 5–15)
BUN: 23 mg/dL (ref 8–23)
CO2: 37 mmol/L — ABNORMAL HIGH (ref 22–32)
Calcium: 9.2 mg/dL (ref 8.9–10.3)
Chloride: 91 mmol/L — ABNORMAL LOW (ref 98–111)
Creatinine, Ser: 0.87 mg/dL (ref 0.61–1.24)
GFR, Estimated: >60 mL/min (ref >60)
Glucose, Bld: 123 mg/dL — ABNORMAL HIGH (ref 70–99)
Potassium: 4.2 mmol/L (ref 3.5–5.1)
Sodium: 139 mmol/L (ref 135–145)

## 2023-06-06 MED ORDER — PREDNISONE 20 MG PO TABS
ORAL_TABLET | ORAL | 0 refills | Status: DC
Start: 2023-06-06 — End: 2023-06-14

## 2023-06-06 MED ORDER — METOPROLOL SUCCINATE ER 25 MG PO TB24: 25.0000 mg | ORAL_TABLET | Freq: Every day | ORAL | 0 refills | Status: DC

## 2023-06-06 MED ORDER — AZITHROMYCIN 500 MG PO TABS: 500.0000 mg | ORAL_TABLET | Freq: Every day | ORAL | 0 refills | Status: AC

## 2023-06-06 NOTE — Progress Notes (Signed)
 PROGRESS NOTE    Johnny Rodriguez  ZOX:[id number] DOB: [birth year] DOA: 06/03/2023 PCP: Pcp, No  Subjective: Pt seen and examined.  Pt refused Bipap last night.  Pt demanding to go home. Told RN that he will leave AMA if he has to.   Hospital Course: HPI: Johnny Rodriguez is a 73 y.o. 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  EMS found the patient O2 sat 80% on 2 L oxygen  however patient refused CPAP or patient has been recently admitted and discharge on 3/26 to 4/10 for acute hypoxic respiratory failure and exacerbation and CHF exacerbation.   During my evaluation at the bedside patient is on BiPAP.  He has been reporting cough and progressive worsening shortness of breath.  Denies any fever, chill, chest pain, and palpitation.  Denies any lower extremities swelling.  Denies any recent known sick contact..  No other complaint at this time.     ED Course:  At presentation to ED O2 sat 93 to 98% on BiPAP.  Otherwise hemodynamically stable. VBG showing pH 7.2, pCO2 87, pO2 65, bicarb 14. Normal lactic acid level 1.5. Elevated pro time INR 17 and 1.4. Normal troponin. Slight elevated BNP around 115.9. CBC unremarkable except low platelet count 141. Pending CMP.   Chest x-ray emphysema lung with a small stable right pleural effusion.   In the patient has been treated with Lasix  40 mg, methylprednisolone , DuoNeb and mag sulfate 2 g.   Hospitalist has been consulted for further evaluation management of acute on chronic hypoxic, hypercarbic respiratory failure and COPD exacerbation.  Significant Events: Admitted 06/03/2023 for COPD exacerbation   Significant Labs: Abg pH 7.27, PCO2 of 87, PO2 of  65 Lactic acid 1.6 WBC 6.3, HgB 13.1, Plt 141 BNP 115 INR 1.4 Na 138, K 3.7, CO2 of 38, BUN 10, Scr 0.94 RVP negative for 20 pathogens  Significant Imaging Studies: CXR Emphysematous lung disease with a very small, stable right pleural effusion.  Antibiotic Therapy: Anti-infectives (From admission, onward)    Start     Dose/Rate Route Frequency Ordered Stop   06/04/23 0200  azithromycin  (ZITHROMAX ) 500 mg in sodium chloride  0.9 % 250 mL IVPB        500 mg 250 mL/hr over 60 Minutes Intravenous Every 24 hours 06/04/23 0125 06/09/23 0159       Procedures:   Consultants:     Assessment and Plan: * COPD with acute exacerbation (HCC) 06-04-2023 continue with IV solumedrol, duonebs q6h, zithromax   06-05-2023 baseline PCO2 around high 80s to low 90s on VBG. Pt refused bipap last night. Change to po prednisone  and monitor. Potential for DC tomorrow. Continue q6h nebs, po zithromax .  06-06-2023 DC to home with 12 day prednisone  taper and 3 more days of zithromax . Pt states he has nebulizer medication at home.  Acute on chronic respiratory failure with hypoxia and hypercapnia (HCC) - baseline PCO2 around high 80s to low 90s on VBG 06-04-2023 currently on bipap. pH and PCO2 have plateaued and not improved for several hours. Repeat ABG at 9 am.  pH 7.3 and PO2 around 80. Pt is awake and alert. Do not think he needs intubation at this point. Will need to monitor him closely.   06-05-2023 baseline PCO2 around high 80s to low  90s on VBG. Pt refused bipap last night. Change to po prednisone  and monitor. Potential for DC tomorrow.  06-06-2023 in no distress. Pt demanding to be discharged today. Pt advised not to smoke any longer. Pt states "that ain't a problem anymore".  Insomnia 06-04-2023 chronic.  06-05-2023 chronic.  06-06-2023 stable.  Chronic pain syndrome 06-04-2023 on neurontin  as outpatient. Not getting any prescribed opiates.  06-05-2023 stable on  neurontin .  06-06-2023 stable.  Atrial fibrillation, chronic (HCC) 06-04-2023 chronically on Toprol -XL for rate control. Holding betablocker for now due to hypotension from PPV.  06-05-2023 HR controlled. BP too low to restart Toprol -XL.  06-06-2023 stable. Decrease home dose of Toprol -XL to 25 mg at bedtime. Change to at bedtime dosing to avoid hypotension during the day.  History of pulmonary embolism 06-04-2023 on eliquis .  06-06-2023 stable.  Chronic hypoxic respiratory failure (HCC) - chronically on 3 L/min continuously, baseline PCO2 around high 80s to low 90s on VBG 06-04-2023 due to hypercapnia. Continue supplemental O2 to keep sats 88-92%.  06-05-2023 chronically on 3 L/min.  06-06-2023 continue 3 L/min. Keep O2 sats 88-92%.  Continuous dependence on cigarette smoking 06-04-2023 pt denies smoking since discharge 05-21-2023. Not sure I believe him.  06-05-2023 pt advised to stop smoking.  06-06-2023 Pt advised not to smoke any longer. Pt states "that ain't a problem anymore".   Long term current use of anticoagulant therapy 06-04-2023 due to hx of PE and chronic afib. On Eliquis .  06-05-2023 continue eliquis   06-06-2023 stable.   Essential hypertension 06-04-2023 hold his HTN for now due to low BP.  06-05-2023 BP stable off HTN meds.  06-06-2023 stable.   Chronic systolic CHF (congestive heart failure) (HCC) 06-04-2023 not exacerbated. Actually a bit on the dry side. Needed IVF and IV albumin  due to hypotension. Likely due to PPV.  06-05-2023 stable. Not exacerbated. Last echo 05-2023 LVEF 35 to 40%   06-06-2023 stable.       DVT prophylaxis: Place and maintain sequential compression device Start: 06/04/23 0157 SCDs Start: 06/04/23 0126 Place TED hose Start: 06/04/23 0126 apixaban  (ELIQUIS ) tablet 5 mg     Code Status: Full Code Family Communication: no family at bedside. Pt is decisional. Disposition Plan: return home. Pt is demanding to be  discharged today. Reason for continuing need for hospitalization: marginally stable for DC.  Pt is demanding to be discharged today.  He has a HIGH RISK of readmission.  Objective: Vitals:   06/05/23 2340 06/06/23 0334 06/06/23 0753 06/06/23 0846  BP: 105/68 115/75 118/81   Pulse: 97 90 (!) 104 (!) 114  Resp: 18 18 16 18   Temp: 98.2 F (36.8 C) 98 F (36.7 C) 98.9 F (37.2 C)   TempSrc: Oral Oral Oral   SpO2: 96% 92% 94% 95%  Weight:      Height:        Intake/Output Summary (Last 24 hours) at 06/06/2023 0900 Last data filed at 06/06/2023 0410 Gross per 24 hour  Intake 500 ml  Output 850 ml  Net -350 ml   Filed Weights   06/03/23 2348 06/04/23 2108  Weight: 66.2 kg 77.4 kg    Examination:  Physical Exam Vitals and nursing note reviewed.  Constitutional:      General: He is not in acute distress.    Appearance: He is not toxic-appearing or diaphoretic.  HENT:     Head: Normocephalic and atraumatic.     Nose: Nose normal.  Eyes:     General: No scleral icterus.  Cardiovascular:     Rate and Rhythm: Normal rate. Rhythm irregular.  Pulmonary:     Effort: Pulmonary effort is normal. No respiratory distress.     Breath sounds: No wheezing or rales.  Abdominal:     General: Bowel sounds are normal.     Palpations: Abdomen is soft.  Musculoskeletal:     Right lower leg: No edema.     Left lower leg: No edema.  Skin:    General: Skin is warm and dry.     Capillary Refill: Capillary refill takes less than 2 seconds.  Neurological:     General: No focal deficit present.     Mental Status: He is alert and oriented to person, place, and time.     Data Reviewed: I have personally reviewed following labs and imaging studies  CBC: Recent Labs  Lab 06/03/23 2346 06/03/23 2355 06/04/23 0110 06/04/23 0326 06/04/23 0347 06/04/23 1016 06/05/23 0424  WBC 6.3  --   --  5.4  --   --  8.0  NEUTROABS 4.7  --   --   --   --   --  7.5  HGB 13.1   < > 14.3 12.9* 13.9  12.9* 12.2*  HCT 42.4   < > 42.0 41.2 41.0 38.0* 38.5*  MCV 101.7*  --   --  101.7*  --   --  98.5  PLT 141*  --   --  135*  --   --  122*   < > = values in this interval not displayed.   Basic Metabolic Panel: Recent Labs  Lab 06/04/23 0118 06/04/23 0326 06/04/23 0347 06/04/23 1016 06/05/23 0424 06/06/23 0352  NA 138 137 135 137 136 139  K 3.7 3.8 3.8 4.5 5.1 4.2  CL 89* 93*  --   --  92* 91*  CO2 38* 33*  --   --  31 37*  GLUCOSE 141* 162*  --   --  167* 123*  BUN 10 10  --   --  23 23  CREATININE 0.94 0.92  --   --  1.13 0.87  CALCIUM  8.8* 8.3*  --   --  8.8* 9.2   GFR: Estimated Creatinine Clearance: 82.9 mL/min (by C-G formula based on SCr of 0.87 mg/dL). Liver Function Tests: Recent Labs  Lab 06/04/23 0118 06/04/23 0326  AST 17 18  ALT 19 17  ALKPHOS 55 52  BILITOT 0.7 0.7  PROT 6.2* 5.9*  ALBUMIN  3.3* 3.0*   Coagulation Profile: Recent Labs  Lab 06/03/23 2346  INR 1.4*   BNP (last 3 results) Recent Labs    05/18/23 0650 05/19/23 0437 06/03/23 2346  BNP 84.5 60.8 115.9*   CBG: Recent Labs  Lab 06/04/23 0731 06/04/23 1159 06/05/23 0020 06/05/23 0601 06/05/23 1145  GLUCAP 151* 135* 160* 191* 162*   Sepsis Labs: Recent Labs  Lab 06/04/23 0008  LATICACIDVEN 1.5    Recent Results (from the past 240 hours)  Respiratory (~20 pathogens) panel by PCR     Status: None   Collection Time: 06/04/23  2:49 AM   Specimen: Nasopharyngeal Swab; Respiratory  Result Value Ref Range Status   Adenovirus NOT DETECTED NOT DETECTED Final   Coronavirus 229E NOT DETECTED NOT DETECTED Final    Comment: (NOTE) The Coronavirus on the Respiratory Panel, DOES NOT test for the novel  Coronavirus (2019 nCoV)    Coronavirus HKU1 NOT DETECTED NOT DETECTED Final   Coronavirus NL63 NOT DETECTED NOT DETECTED Final  Coronavirus OC43 NOT DETECTED NOT DETECTED Final   Metapneumovirus NOT DETECTED NOT DETECTED Final   Rhinovirus / Enterovirus NOT DETECTED NOT DETECTED  Final   Influenza A NOT DETECTED NOT DETECTED Final   Influenza B NOT DETECTED NOT DETECTED Final   Parainfluenza Virus 1 NOT DETECTED NOT DETECTED Final   Parainfluenza Virus 2 NOT DETECTED NOT DETECTED Final   Parainfluenza Virus 3 NOT DETECTED NOT DETECTED Final   Parainfluenza Virus 4 NOT DETECTED NOT DETECTED Final   Respiratory Syncytial Virus NOT DETECTED NOT DETECTED Final   Bordetella pertussis NOT DETECTED NOT DETECTED Final   Bordetella Parapertussis NOT DETECTED NOT DETECTED Final   Chlamydophila pneumoniae NOT DETECTED NOT DETECTED Final   Mycoplasma pneumoniae NOT DETECTED NOT DETECTED Final    Comment: Performed at [facility], [address] [place], [place] [address]  MRSA Next Gen by PCR, Nasal     Status: None   Collection Time: 06/04/23  2:49 AM   Specimen: Nasopharyngeal Swab; Nasal Swab  Result Value Ref Range Status   MRSA by PCR Next Gen NOT DETECTED NOT DETECTED Final    Comment: (NOTE) The GeneXpert MRSA Assay (FDA approved for NASAL specimens only), is one component of a comprehensive MRSA colonization surveillance program. It is not intended to diagnose MRSA infection nor to guide or monitor treatment for MRSA infections. Test performance is not FDA approved in patients less than 68 years old. Performed at [facility], [address] [place], [place] [address]      Radiology Studies: No results found.  Scheduled Meds:  apixaban   5 mg Oral BID   aspirin  EC  81 mg Oral Daily   azithromycin   500 mg Oral Daily   budesonide  (PULMICORT ) nebulizer solution  0.25 mg Nebulization BID   ipratropium-albuterol   3 mL Nebulization TID   nicotine   21 mg Transdermal Daily   predniSONE   40 mg Oral Q breakfast   Continuous Infusions:   LOS: 2 days   Time spent: 45 minutes  Unk Garb, DO  [facility]  06/06/2023, 9:00 AM

## 2023-06-06 NOTE — Discharge Summary (Signed)
 [facility] Physician Discharge Summary   Patient name: Johnny Rodriguez  Admit date:     06/03/2023  Discharge date: 06/06/2023  Attending Physician: SUNDIL, SUBRINA [[id number]]  Discharge Physician: Unk Garb   PCP: Pcp, No  Admitted From: Home  Disposition:  Home  Recommendations for Outpatient Follow-up:  Follow up with PCP in 1-2 weeks  Home Health:No Equipment/Devices: Oxygen  3 L/min. Pt is chronically on 3 L/min  Discharge Condition:Guarded. marginally stable for DC.  Pt is demanding to be discharged today.  He has a HIGH RISK of readmission  CODE STATUS:FULL Diet recommendation: Heart Healthy Fluid Restriction: None  Hospital Summary: HPI: Johnny Rodriguez is a 73 y.o. 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  EMS found the patient O2 sat 80% on 2 L oxygen  however patient refused CPAP or patient has been recently admitted and discharge on 3/26 to 4/10 for acute hypoxic respiratory failure and exacerbation and CHF exacerbation.   During my evaluation at the bedside patient is on BiPAP.  He has been reporting cough and progressive worsening shortness of breath.  Denies any fever, chill, chest pain, and palpitation.  Denies any lower extremities swelling.  Denies any recent known sick contact..  No other complaint at this time.     ED Course:  At presentation to ED O2 sat 93 to 98% on BiPAP.  Otherwise hemodynamically stable. VBG showing pH 7.2, pCO2 87, pO2 65, bicarb 14. Normal lactic acid level 1.5. Elevated pro time INR 17 and 1.4. Normal troponin. Slight elevated BNP around 115.9. CBC unremarkable except low platelet count 141. Pending CMP.   Chest x-ray emphysema lung with a  small stable right pleural effusion.   In the patient has been treated with Lasix  40 mg, methylprednisolone , DuoNeb and mag sulfate 2 g.   Hospitalist has been consulted for further evaluation management of acute on chronic hypoxic, hypercarbic respiratory failure and COPD exacerbation.  Significant Events: Admitted 06/03/2023 for COPD exacerbation   Significant Labs: Abg pH 7.27, PCO2 of 87, PO2 of 65 Lactic acid 1.6 WBC 6.3, HgB 13.1, Plt 141 BNP 115 INR 1.4 Na 138, K 3.7, CO2 of 38, BUN 10, Scr 0.94 RVP negative for 20 pathogens  Significant Imaging Studies: CXR Emphysematous lung disease with a very small, stable right pleural effusion.  Antibiotic Therapy: Anti-infectives (From admission, onward)    Start     Dose/Rate Route Frequency Ordered Stop   06/04/23 0200  azithromycin  (ZITHROMAX ) 500 mg in sodium chloride  0.9 % 250 mL IVPB        500 mg 250 mL/hr over 60 Minutes Intravenous Every 24 hours 06/04/23 0125 06/09/23 0159       Procedures:   Consultants:    Hospital Course by Problem: * COPD with acute exacerbation (HCC) 06-04-2023 continue with IV solumedrol, duonebs q6h, zithromax   06-05-2023 baseline PCO2 around high 80s to low 90s on VBG. Pt refused bipap last night. Change to po prednisone  and monitor. Potential for DC tomorrow. Continue q6h nebs, po zithromax .  06-06-2023 DC to home with 12 day prednisone  taper and 3 more days of zithromax . Pt states he has nebulizer medication at home. marginally stable for DC.  Pt is demanding to be discharged today.  He has a HIGH RISK of  readmission   Acute on chronic respiratory failure with hypoxia and hypercapnia (HCC) - baseline PCO2 around high 80s to low 90s on VBG 06-04-2023 currently on bipap. pH and PCO2 have plateaued and not improved for several hours. Repeat ABG at 9 am.  pH 7.3 and PO2 around 80. Pt is awake and alert. Do not think he needs intubation at this point. Will need to monitor him closely.    06-05-2023 baseline PCO2 around high 80s to low 90s on VBG. Pt refused bipap last night. Change to po prednisone  and monitor. Potential for DC tomorrow.  06-06-2023 in no distress. Pt demanding to be discharged today. Pt advised not to smoke any longer. Pt states "that ain't a problem anymore".  Insomnia 06-04-2023 chronic.  06-05-2023 chronic.  06-06-2023 stable.  Chronic pain syndrome 06-04-2023 on neurontin  as outpatient. Not getting any prescribed opiates.  06-05-2023 stable on neurontin .  06-06-2023 stable.  Atrial fibrillation, chronic (HCC) 06-04-2023 chronically on Toprol -XL for rate control. Holding betablocker for now due to hypotension from PPV.  06-05-2023 HR controlled. BP too low to restart Toprol -XL.  06-06-2023 stable. Decrease home dose of Toprol -XL to 25 mg at bedtime. Change to at bedtime dosing to avoid hypotension during the day.  History of pulmonary embolism 06-04-2023 on eliquis .  06-06-2023 stable.  Chronic hypoxic respiratory failure (HCC) - chronically on 3 L/min continuously, baseline PCO2 around high 80s to low 90s on VBG 06-04-2023 due to hypercapnia. Continue supplemental O2 to keep sats 88-92%.  06-05-2023 chronically on 3 L/min.  06-06-2023 continue 3 L/min. Keep O2 sats 88-92%.  Continuous dependence on cigarette smoking 06-04-2023 pt denies smoking since discharge 05-21-2023. Not sure I believe him.  06-05-2023 pt advised to stop smoking.  06-06-2023 Pt advised not to smoke any longer. Pt states "that ain't a problem anymore".   Long term current use of anticoagulant therapy 06-04-2023 due to hx of PE and chronic afib. On Eliquis .  06-05-2023 continue eliquis   06-06-2023 stable.   Essential hypertension 06-04-2023 hold his HTN for now due to low BP.  06-05-2023 BP stable off HTN meds.  06-06-2023 stable.   Chronic systolic CHF (congestive heart failure) (HCC) 06-04-2023 not exacerbated. Actually a bit on the dry side.  Needed IVF and IV albumin  due to hypotension. Likely due to PPV.  06-05-2023 stable. Not exacerbated. Last echo 05-2023 LVEF 35 to 40%   06-06-2023 stable.     Discharge Diagnoses:  Principal Problem:   COPD with acute exacerbation (HCC) Active Problems:   Acute on chronic respiratory failure with hypoxia and hypercapnia (HCC) - baseline PCO2 around high 80s to low 90s on VBG   Chronic systolic CHF (congestive heart failure) (HCC)   Essential hypertension   Long term current use of anticoagulant therapy   Continuous dependence on cigarette smoking   Chronic hypoxic respiratory failure (HCC) - chronically on 3 L/min continuously, baseline PCO2 around high 80s to low 90s on VBG   History of pulmonary embolism   Atrial fibrillation, chronic (HCC)   Chronic pain syndrome   Insomnia   Discharge Instructions  Discharge Instructions     Ambulatory Referral for Lung Cancer Scre   Complete by: As directed    Call MD for:  difficulty breathing, headache or visual disturbances   Complete by: As directed    Call MD for:  extreme fatigue   Complete by: As directed    Call MD for:  persistant dizziness or light-headedness   Complete by: As directed  Call MD for:  persistant nausea and vomiting   Complete by: As directed    Call MD for:  temperature >100.4   Complete by: As directed    Diet - low sodium heart healthy   Complete by: As directed    Discharge instructions   Complete by: As directed    1. Follow up with your primary care provider in 1-2 weeks following discharge from hospital. 2. Do Not smoke any form of tobacco or other substances. Do NOT use any e-cigarettes.   Increase activity slowly   Complete by: As directed       Allergies as of 06/06/2023   No Known Allergies      Medication List     TAKE these medications    albuterol  (2.5 MG/3ML) 0.083% nebulizer solution Commonly known as: PROVENTIL  Take 3 mLs (2.5 mg total) by nebulization every 4 (four) hours  as needed for wheezing or shortness of breath.   Ventolin  HFA 108 (90 Base) MCG/ACT inhaler Generic drug: albuterol  Inhale 1-2 puffs into the lungs every 6 (six) hours as needed for wheezing or shortness of breath   aspirin  EC 81 MG tablet Take 1 tablet (81 mg total) by mouth daily. Swallow whole.   azithromycin  500 MG tablet Commonly known as: ZITHROMAX  Take 1 tablet (500 mg total) by mouth daily for 3 days.   Breo Ellipta  100-25 MCG/ACT Aepb Generic drug: fluticasone  furoate-vilanterol Inhale 1 puff into the lungs daily. What changed:  when to take this reasons to take this   Eliquis  5 MG Tabs tablet Generic drug: apixaban  Take 1 tablet (5 mg total) by mouth 2 (two) times daily.   folic acid  1 MG tablet Commonly known as: FOLVITE  Take 1 tablet (1 mg total) by mouth daily.   gabapentin  400 MG capsule Commonly known as: NEURONTIN  Take 1 capsule (400 mg total) by mouth daily as needed (for nerve pain). What changed: when to take this   linaclotide  145 MCG Caps capsule Commonly known as: Linzess  Take 1 capsule (145 mcg total) by mouth daily before breakfast.   metoprolol  succinate 25 MG 24 hr tablet Commonly known as: TOPROL -XL Take 1 tablet (25 mg total) by mouth at bedtime. What changed:  medication strength how much to take when to take this   OXYGEN  Inhale 3 L into the lungs continuous.   pantoprazole  40 MG tablet Commonly known as: PROTONIX  Take 1 tablet (40 mg total) by mouth daily as needed (acid reflux). What changed: when to take this   polyethylene glycol 17 g packet Commonly known as: MIRALAX  / GLYCOLAX  Take 17 g by mouth daily. What changed:  when to take this reasons to take this   predniSONE  20 MG tablet Commonly known as: DELTASONE  Take 2 tablets (40 mg total) by mouth daily with breakfast for 3 days, THEN 1.5 tablets (30 mg total) daily with breakfast for 3 days, THEN 1 tablet (20 mg total) daily with breakfast for 3 days, THEN 0.5 tablets  (10 mg total) daily with breakfast for 3 days. Start taking on: June 06, 2023   senna 8.6 MG Tabs tablet Commonly known as: SENOKOT Take 1-2 tablets (8.6-17.2 mg total) by mouth daily. What changed:  when to take this reasons to take this   Spiriva  Respimat 2.5 MCG/ACT Aers Generic drug: Tiotropium Bromide  Monohydrate Inhale 2 puffs into the lungs daily. What changed:  when to take this reasons to take this   traZODone  50 MG tablet Commonly known as: DESYREL  Take  1 tablet (50 mg total) by mouth at bedtime as needed for sleep. What changed: when to take this   VITAMIN D -3 PO Take 1 capsule by mouth daily.       No Known Allergies  Discharge Exam: Vitals:   06/06/23 0753 06/06/23 0846  BP: 118/81   Pulse: (!) 104 (!) 114  Resp: 16 18  Temp: 98.9 F (37.2 C)   SpO2: 94% 95%   Physical Exam Vitals and nursing note reviewed.  Constitutional:      General: He is not in acute distress.    Appearance: He is not toxic-appearing or diaphoretic.  HENT:     Head: Normocephalic and atraumatic.     Nose: Nose normal.  Eyes:     General: No scleral icterus. Cardiovascular:     Rate and Rhythm: Normal rate. Rhythm irregular.  Pulmonary:     Effort: Pulmonary effort is normal. No respiratory distress.     Breath sounds: No wheezing or rales.  Abdominal:     General: Bowel sounds are normal.     Palpations: Abdomen is soft.  Musculoskeletal:     Right lower leg: No edema.     Left lower leg: No edema.  Skin:    General: Skin is warm and dry.     Capillary Refill: Capillary refill takes less than 2 seconds.  Neurological:     General: No focal deficit present.     Mental Status: He is alert and oriented to person, place, and time.    The results of significant diagnostics from this hospitalization (including imaging, microbiology, ancillary and laboratory) are listed below for reference.    Microbiology: Recent Results (from the past 240 hours)  Respiratory (~20  pathogens) panel by PCR     Status: None   Collection Time: 06/04/23  2:49 AM   Specimen: Nasopharyngeal Swab; Respiratory  Result Value Ref Range Status   Adenovirus NOT DETECTED NOT DETECTED Final   Coronavirus 229E NOT DETECTED NOT DETECTED Final    Comment: (NOTE) The Coronavirus on the Respiratory Panel, DOES NOT test for the novel  Coronavirus (2019 nCoV)    Coronavirus HKU1 NOT DETECTED NOT DETECTED Final   Coronavirus NL63 NOT DETECTED NOT DETECTED Final   Coronavirus OC43 NOT DETECTED NOT DETECTED Final   Metapneumovirus NOT DETECTED NOT DETECTED Final   Rhinovirus / Enterovirus NOT DETECTED NOT DETECTED Final   Influenza A NOT DETECTED NOT DETECTED Final   Influenza B NOT DETECTED NOT DETECTED Final   Parainfluenza Virus 1 NOT DETECTED NOT DETECTED Final   Parainfluenza Virus 2 NOT DETECTED NOT DETECTED Final   Parainfluenza Virus 3 NOT DETECTED NOT DETECTED Final   Parainfluenza Virus 4 NOT DETECTED NOT DETECTED Final   Respiratory Syncytial Virus NOT DETECTED NOT DETECTED Final   Bordetella pertussis NOT DETECTED NOT DETECTED Final   Bordetella Parapertussis NOT DETECTED NOT DETECTED Final   Chlamydophila pneumoniae NOT DETECTED NOT DETECTED Final   Mycoplasma pneumoniae NOT DETECTED NOT DETECTED Final    Comment: Performed at [facility], [address] [place], [place] [address]  MRSA Next Gen by PCR, Nasal     Status: None   Collection Time: 06/04/23  2:49 AM   Specimen: Nasopharyngeal Swab; Nasal Swab  Result Value Ref Range Status   MRSA by PCR Next Gen NOT DETECTED NOT DETECTED Final    Comment: (NOTE) The GeneXpert MRSA Assay (FDA approved for NASAL specimens only), is one component of a comprehensive MRSA  colonization surveillance program. It is not intended to diagnose MRSA infection nor to guide or monitor treatment for MRSA infections. Test performance is not FDA approved in patients less than 32 years old. Performed at [facility],  [address], [place], [place] [address]     Labs: BNP (last 3 results) Recent Labs    05/18/23 0650 05/19/23 0437 06/03/23 2346  BNP 84.5 60.8 115.9*   Basic Metabolic Panel: Recent Labs  Lab 06/04/23 0118 06/04/23 0326 06/04/23 0347 06/04/23 1016 06/05/23 0424 06/06/23 0352  NA 138 137 135 137 136 139  K 3.7 3.8 3.8 4.5 5.1 4.2  CL 89* 93*  --   --  92* 91*  CO2 38* 33*  --   --  31 37*  GLUCOSE 141* 162*  --   --  167* 123*  BUN 10 10  --   --  23 23  CREATININE 0.94 0.92  --   --  1.13 0.87  CALCIUM  8.8* 8.3*  --   --  8.8* 9.2   Liver Function Tests: Recent Labs  Lab 06/04/23 0118 06/04/23 0326  AST 17 18  ALT 19 17  ALKPHOS 55 52  BILITOT 0.7 0.7  PROT 6.2* 5.9*  ALBUMIN  3.3* 3.0*   CBC: Recent Labs  Lab 06/03/23 2346 06/03/23 2355 06/04/23 0110 06/04/23 0326 06/04/23 0347 06/04/23 1016 06/05/23 0424  WBC 6.3  --   --  5.4  --   --  8.0  NEUTROABS 4.7  --   --   --   --   --  7.5  HGB 13.1   < > 14.3 12.9* 13.9 12.9* 12.2*  HCT 42.4   < > 42.0 41.2 41.0 38.0* 38.5*  MCV 101.7*  --   --  101.7*  --   --  98.5  PLT 141*  --   --  135*  --   --  122*   < > = values in this interval not displayed.   BNP: Recent Labs  Lab 06/03/23 2346  BNP 115.9*   CBG: Recent Labs  Lab 06/04/23 0731 06/04/23 1159 06/05/23 0020 06/05/23 0601 06/05/23 1145  GLUCAP 151* 135* 160* 191* 162*   Urinalysis    Component Value Date/Time   COLORURINE AMBER (A) 05/06/2023 1815   APPEARANCEUR HAZY (A) 05/06/2023 1815   LABSPEC 1.017 05/06/2023 1815   PHURINE 5.0 05/06/2023 1815   GLUCOSEU 50 (A) 05/06/2023 1815   HGBUR SMALL (A) 05/06/2023 1815   BILIRUBINUR NEGATIVE 05/06/2023 1815   KETONESUR 5 (A) 05/06/2023 1815   PROTEINUR 100 (A) 05/06/2023 1815   UROBILINOGEN 0.2 12/08/2012 1516   NITRITE NEGATIVE 05/06/2023 1815   LEUKOCYTESUR NEGATIVE 05/06/2023 1815   Sepsis Labs Recent Labs  Lab 06/03/23 2346 06/04/23 0326 06/05/23 0424  WBC 6.3 5.4 8.0     Recent Labs    06/03/23 2355 06/04/23 0110 06/04/23 0347 06/04/23 1016 06/05/23 0424  PHVEN  --   --   --   --  7.32  PCO2VEN  --   --   --   --  89*  PO2VEN  --   --   --   --  32  HCO3 40.7* 43.5* 43.4* 41.2* 46.4*  O2SAT 88 90 92 97 49.1   ABG    Component Value Date/Time   PHART 7.332 (L) 06/04/2023 1016   PCO2ART 77.8 (HH) 06/04/2023 1016   PO2ART 98 06/04/2023 1016   HCO3 46.4 (H) 06/05/2023 0424  TCO2 44 (H) 06/04/2023 1016   ACIDBASEDEF 4.0 (H) 03/29/2017 1604   O2SAT 49.1 06/05/2023 0424     Procedures/Studies: DG Chest Portable 1 View Result Date: 06/04/2023 CLINICAL DATA:  Shortness of breath. EXAM: PORTABLE CHEST 1 VIEW COMPARISON:  May 18, 2023 FINDINGS: The heart size and mediastinal contours are within normal limits. There is mild to moderate severity calcification of the aortic arch. The lungs are hyperinflated. There is no evidence of acute infiltrate. A very small right pleural effusion is suspected. No pneumothorax is identified. Multilevel degenerative changes seen throughout the thoracic spine. IMPRESSION: Emphysematous lung disease with a very small, stable right pleural effusion. Electronically Signed   By: Virgle Grime M.D.   On: 06/04/2023 00:21   ECHOCARDIOGRAM LIMITED BUBBLE STUDY Result Date: 05/19/2023    ECHOCARDIOGRAM LIMITED REPORT   Patient Name:   Johnny Rodriguez Date of Exam: 05/19/2023 Medical Rec #:  [id number]          Height:       71.0 in Accession #:    [id number]         Weight:       160.0 lb Date of Birth:  Oct 02, 1950          BSA:          1.918 m Patient Age:    73 years           BP:           112/66 mmHg Patient Gender: M                  HR:           92 bpm. Exam Location:  Inpatient Procedure: Limited Echo, Cardiac Doppler, Color Doppler, Intracardiac            Opacification Agent and Saline Contrast Bubble Study (Both Spectral            and Color Flow Doppler were utilized during procedure). Indications:    I50.40*  Unspecified combined systolic (congestive) and diastolic                 (congestive) heart failure  History:        Patient has prior history of Echocardiogram examinations, most                 recent 04/30/2023. Cardiomyopathy and CHF, CAD, Abnormal ECG,                 Arrythmias:Atrial Fibrillation, Signs/Symptoms:Dyspnea,                 Shortness of Breath and Syncope; Risk Factors:Current Smoker.                 Pulmonary embolus.  Sonographer:    Raynelle Callow RDCS Referring Phys: [phone] EKTA V PATEL IMPRESSIONS  1. Left ventricular ejection fraction, by estimation, is 35 to 40%. The left ventricle has moderately decreased function. The left ventricle demonstrates global hypokinesis. There is mild concentric left ventricular hypertrophy. Left ventricular diastolic parameters are indeterminate.  2. The right ventricular size is mildly enlarged. There is mildly elevated pulmonary artery systolic pressure. The estimated right ventricular systolic pressure is 38.5 mmHg.  3. Left atrial size was moderately dilated.  4. A small pericardial effusion is present. The pericardial effusion is anterior to the right ventricle. There is no evidence of cardiac tamponade.  5. The mitral valve is normal in structure. No evidence of mitral valve regurgitation. No  evidence of mitral stenosis.  6. Aortic valve regurgitation is not visualized.  7. The inferior vena cava is dilated in size with >50% respiratory variability, suggesting right atrial pressure of 8 mmHg. Comparison(s): No significant change from prior study. Prior images reviewed side by side. FINDINGS  Left Ventricle: On non-contrast images concern for left ventricular apical thrombus. This does not appear on contrast images. Left ventricular ejection fraction, by estimation, is 35 to 40%. The left ventricle has moderately decreased function. The left  ventricle demonstrates global hypokinesis. Definity  contrast agent was given IV to delineate the left ventricular  endocardial borders. The left ventricular internal cavity size was normal in size. There is mild concentric left ventricular hypertrophy. Left ventricular diastolic parameters are indeterminate. Right Ventricle: The right ventricular size is mildly enlarged. There is mildly elevated pulmonary artery systolic pressure. The tricuspid regurgitant velocity is 2.76 m/s, and with an assumed right atrial pressure of 8 mmHg, the estimated right ventricular systolic pressure is 38.5 mmHg. Left Atrium: Left atrial size was moderately dilated. Pericardium: A small pericardial effusion is present. The pericardial effusion is anterior to the right ventricle. There is no evidence of cardiac tamponade. Mitral Valve: The mitral valve is normal in structure. No evidence of mitral valve stenosis. Tricuspid Valve: The tricuspid valve is normal in structure. Tricuspid valve regurgitation is mild . No evidence of tricuspid stenosis. Aortic Valve: Aortic valve regurgitation is not visualized. Aorta: The aortic root and ascending aorta are structurally normal, with no evidence of dilitation. Venous: The inferior vena cava is dilated in size with greater than 50% respiratory variability, suggesting right atrial pressure of 8 mmHg. IAS/Shunts: Agitated saline contrast was given intravenously to evaluate for intracardiac shunting. Additional Comments: Spectral Doppler performed. Color Doppler performed.  LEFT VENTRICLE PLAX 2D LVIDd:         4.70 cm LVIDs:         4.20 cm LV PW:         1.20 cm LV IVS:        1.10 cm  LV Volumes (MOD) LV vol d, MOD A2C: 141.0 ml LV vol d, MOD A4C: 139.0 ml LV vol s, MOD A2C: 89.3 ml LV vol s, MOD A4C: 82.7 ml LV SV MOD A2C:     51.7 ml LV SV MOD A4C:     139.0 ml LV SV MOD BP:      54.9 ml RIGHT VENTRICLE         IVC TAPSE (M-mode): 2.2 cm  IVC diam: 2.20 cm RIGHT ATRIUM           Index RA Area:     25.10 cm RA Volume:   82.10 ml  42.81 ml/m   AORTA Ao Asc diam: 3.20 cm TRICUSPID VALVE TR Peak grad:   30.5  mmHg TR Vmax:        276.00 cm/s Gloriann Larger MD Electronically signed by Gloriann Larger MD Signature Date/Time: 05/19/2023/1:41:06 PM    Final    DG Chest Portable 1 View Result Date: 05/18/2023 CLINICAL DATA:  73 year old male with shortness of breath, respiratory distress. EXAM: PORTABLE CHEST 1 VIEW COMPARISON:  Portable chest 05/12/2023.  CTA chest 05/13/2023. FINDINGS: Portable AP views at 0700 hours. Hyperinflated lungs. Centrilobular emphysema and small layering left pleural effusion on the recent CTA. Lung volumes and ventilation stable since 05/12/2023. No pneumothorax, pulmonary edema, new lung opacity. Normal cardiac size and mediastinal contours. Visualized tracheal air column is within normal limits. Stable visualized osseous structures. Negative  visible bowel gas. IMPRESSION: Emphysema (ICD10-J43.9) and small left pleural effusion stable from recent CTA. No new cardiopulmonary abnormality. Electronically Signed   By: Marlise Simpers M.D.   On: 05/18/2023 07:08   CT Angio Chest Pulmonary Embolism (PE) W or WO Contrast Result Date: 05/13/2023 CLINICAL DATA:  Hypoxia EXAM: CT ANGIOGRAPHY CHEST WITH CONTRAST TECHNIQUE: Multidetector CT imaging of the chest was performed using the standard protocol during bolus administration of intravenous contrast. Multiplanar CT image reconstructions and MIPs were obtained to evaluate the vascular anatomy. RADIATION DOSE REDUCTION: This exam was performed according to the departmental dose-optimization program which includes automated exposure control, adjustment of the mA and/or kV according to patient size and/or use of iterative reconstruction technique. CONTRAST:  75mL OMNIPAQUE  IOHEXOL  350 MG/ML SOLN COMPARISON:  CTA 03/27/2022. FINDINGS: Cardiovascular: Heart is enlarged. Small pericardial effusion, increased from prior CTA. Coronary artery calcifications are seen. The thoracic aorta has a normal course and caliber with some atherosclerotic partially  calcified plaque. Some enlargement of the main pulmonary arteries. Please correlate for evidence of pulmonary hypertension. Also some subtle filling defect identified in the right lower lobe central pulmonary artery and some peripheral areas along the posteromedial right lower lobe. Subtle pulmonary emboli. Some of the changes could be mixing artifact as well. Distribution is similar to previous but appears more so than on the prior. Right IJ line in place. Mediastinum/Nodes: Preserved thyroid  gland. Slightly patulous thoracic esophagus. No specific abnormal lymph node enlargement present in the axillary region, hilum or mediastinum. Lungs/Pleura: Small left and tiny right pleural effusions are identified. Diffuse peripheral interstitial septal thickening identified with some nodular contour to the thickening. Tree-in-bud like changes. There is significant areas of mucous plugging identified in the right lower lobe. Subtle areas of atelectasis seen in the lingula dependently. Additional underlying centrilobular emphysematous changes. Upper Abdomen: Stable nodular thickening of the adrenal glands, left greater than right. Musculoskeletal: Curvature of the spine with scattered degenerative changes. Stable mild compression deformity at of T6. multiple healed left-sided rib fractures. Review of the MIP images confirms the above findings. Critical Value/emergent results were called by telephone at the time of interpretation on 05/13/2023 at 10:59 am to provider [facility] , who verbally acknowledged these results. IMPRESSION: Small area of filling defect along the right lower lobe consistent with pulmonary emboli. Some of this could be mixing artifact as well. Minimal clot burden. Similar distribution to prior embolus but different configuration and slightly more than prior. Enlarged heart with small pericardial effusion. Coronary artery calcifications. Enlargement of the pulmonary arteries. Please correlate for any  evidence of pulmonary hypertension. Emphysematous lung changes. Persistent areas of interstitial septal thickening with tree-in-bud like changes. Aortic Atherosclerosis (ICD10-I70.0) and Emphysema (ICD10-J43.9). Electronically Signed   By: Adrianna Horde M.D.   On: 05/13/2023 11:08   DG Chest Port 1 View Result Date: 05/12/2023 CLINICAL DATA:  09811 Acute respiratory distress [id number] EXAM: PORTABLE CHEST 1 VIEW COMPARISON:  Chest x-ray 05/08/2023 trauma CT angio chest 01/25/2023 FINDINGS: Right chest wall likely subclavian venous catheter with tip overlying the distal superior vena cava. The heart and mediastinal contours are unchanged. Atherosclerotic plaque. Hyperinflation of the lungs. No focal consolidation. Chronic coarsened interstitial markings with no overt pulmonary edema. Blunting of bilateral costophrenic angles with possible bilateral trace pleural effusions. No pneumothorax. No acute osseous abnormality. IMPRESSION: 1. Blunting of bilateral costophrenic angles with possible bilateral trace pleural effusions. 2. Aortic Atherosclerosis (ICD10-I70.0) and Emphysema (ICD10-J43.9). Electronically Signed   By: Morgane  Naveau M.D.  On: 05/12/2023 20:59   DG Chest Port 1 View Result Date: 05/08/2023 CLINICAL DATA:  COPD exacerbation. EXAM: PORTABLE CHEST 1 VIEW COMPARISON:  Radiographs 05/06/2023 and 12/05/2022.  CT 03/27/2022. FINDINGS: 1009 hours. Tip of the endotracheal tube is unchanged, proximally 4.6 cm above the carina. Right subclavian central venous catheter and enteric tubes are unchanged in position. The heart size and mediastinal contours are stable with aortic atherosclerosis. Blunting of the left costophrenic angle is noted, incompletely visualized on the most recent prior study, but appearing progressive from older comparison examinations. There is no edema, confluent airspace disease or pneumothorax. The bones appear unchanged. IMPRESSION: 1. Stable support system. 2. Possible enlarging left  pleural effusion. No focal airspace disease. Electronically Signed   By: Elmon Hagedorn M.D.   On: 05/08/2023 11:06    Time coordinating discharge: 50 mins  SIGNED:  Unk Garb, DO [facility] 06/06/23, 9:06 AM

## 2023-06-10 ENCOUNTER — Encounter (HOSPITAL_COMMUNITY): Payer: Self-pay

## 2023-06-10 ENCOUNTER — Emergency Department (HOSPITAL_COMMUNITY)

## 2023-06-10 ENCOUNTER — Other Ambulatory Visit: Payer: Self-pay

## 2023-06-10 ENCOUNTER — Inpatient Hospital Stay (HOSPITAL_COMMUNITY)
Admission: EM | Admit: 2023-06-10 | Discharge: 2023-06-14 | DRG: 208 | Disposition: A | Discharge status: Home Health | Attending: Internal Medicine | Admitting: Internal Medicine

## 2023-06-10 DIAGNOSIS — Z6823 Body mass index (BMI) 23.0-23.9, adult: Secondary | ICD-10-CM | POA: Diagnosis not present

## 2023-06-10 DIAGNOSIS — Z7901 Long term (current) use of anticoagulants: Secondary | ICD-10-CM

## 2023-06-10 DIAGNOSIS — Z823 Family history of stroke: Secondary | ICD-10-CM | POA: Diagnosis not present

## 2023-06-10 DIAGNOSIS — E875 Hyperkalemia: Secondary | ICD-10-CM | POA: Diagnosis present

## 2023-06-10 DIAGNOSIS — J9612 Chronic respiratory failure with hypercapnia: Secondary | ICD-10-CM | POA: Diagnosis present

## 2023-06-10 DIAGNOSIS — I2510 Atherosclerotic heart disease of native coronary artery without angina pectoris: Secondary | ICD-10-CM | POA: Diagnosis present

## 2023-06-10 DIAGNOSIS — J9622 Acute and chronic respiratory failure with hypercapnia: Secondary | ICD-10-CM | POA: Diagnosis present

## 2023-06-10 DIAGNOSIS — I480 Paroxysmal atrial fibrillation: Secondary | ICD-10-CM | POA: Diagnosis present

## 2023-06-10 DIAGNOSIS — J9601 Acute respiratory failure with hypoxia: Secondary | ICD-10-CM | POA: Diagnosis present

## 2023-06-10 DIAGNOSIS — I428 Other cardiomyopathies: Secondary | ICD-10-CM | POA: Diagnosis present

## 2023-06-10 DIAGNOSIS — F17210 Nicotine dependence, cigarettes, uncomplicated: Secondary | ICD-10-CM | POA: Diagnosis present

## 2023-06-10 DIAGNOSIS — R531 Weakness: Secondary | ICD-10-CM | POA: Diagnosis not present

## 2023-06-10 DIAGNOSIS — E440 Moderate protein-calorie malnutrition: Secondary | ICD-10-CM | POA: Diagnosis present

## 2023-06-10 DIAGNOSIS — J9621 Acute and chronic respiratory failure with hypoxia: Principal | ICD-10-CM | POA: Diagnosis present

## 2023-06-10 DIAGNOSIS — Z1152 Encounter for screening for COVID-19: Secondary | ICD-10-CM | POA: Diagnosis not present

## 2023-06-10 DIAGNOSIS — I4891 Unspecified atrial fibrillation: Secondary | ICD-10-CM | POA: Diagnosis not present

## 2023-06-10 DIAGNOSIS — Z9981 Dependence on supplemental oxygen: Secondary | ICD-10-CM

## 2023-06-10 DIAGNOSIS — I110 Hypertensive heart disease with heart failure: Secondary | ICD-10-CM | POA: Diagnosis present

## 2023-06-10 DIAGNOSIS — Z79899 Other long term (current) drug therapy: Secondary | ICD-10-CM

## 2023-06-10 DIAGNOSIS — J440 Chronic obstructive pulmonary disease with acute lower respiratory infection: Secondary | ICD-10-CM | POA: Diagnosis present

## 2023-06-10 DIAGNOSIS — Z825 Family history of asthma and other chronic lower respiratory diseases: Secondary | ICD-10-CM | POA: Diagnosis not present

## 2023-06-10 DIAGNOSIS — G894 Chronic pain syndrome: Secondary | ICD-10-CM | POA: Diagnosis present

## 2023-06-10 DIAGNOSIS — Z8249 Family history of ischemic heart disease and other diseases of the circulatory system: Secondary | ICD-10-CM | POA: Diagnosis not present

## 2023-06-10 DIAGNOSIS — Z7982 Long term (current) use of aspirin: Secondary | ICD-10-CM | POA: Diagnosis not present

## 2023-06-10 DIAGNOSIS — J209 Acute bronchitis, unspecified: Secondary | ICD-10-CM | POA: Diagnosis present

## 2023-06-10 DIAGNOSIS — Z86711 Personal history of pulmonary embolism: Secondary | ICD-10-CM

## 2023-06-10 DIAGNOSIS — J441 Chronic obstructive pulmonary disease with (acute) exacerbation: Secondary | ICD-10-CM | POA: Diagnosis present

## 2023-06-10 DIAGNOSIS — I5042 Chronic combined systolic (congestive) and diastolic (congestive) heart failure: Secondary | ICD-10-CM | POA: Diagnosis present

## 2023-06-10 DIAGNOSIS — I10 Essential (primary) hypertension: Secondary | ICD-10-CM | POA: Diagnosis not present

## 2023-06-10 DIAGNOSIS — J9602 Acute respiratory failure with hypercapnia: Principal | ICD-10-CM

## 2023-06-10 LAB — BASIC METABOLIC PANEL WITH GFR
Anion gap: 13 (ref 5–15)
Anion gap: 10 (ref 5–15)
BUN: 44 mg/dL — ABNORMAL HIGH (ref 8–23)
BUN: 38 mg/dL — ABNORMAL HIGH (ref 8–23)
CO2: 35 mmol/L — ABNORMAL HIGH (ref 22–32)
CO2: 33 mmol/L — ABNORMAL HIGH (ref 22–32)
Calcium: 8.4 mg/dL — ABNORMAL LOW (ref 8.9–10.3)
Calcium: 8.6 mg/dL — ABNORMAL LOW (ref 8.9–10.3)
Chloride: 92 mmol/L — ABNORMAL LOW (ref 98–111)
Chloride: 97 mmol/L — ABNORMAL LOW (ref 98–111)
Creatinine, Ser: 0.71 mg/dL (ref 0.61–1.24)
Creatinine, Ser: 0.72 mg/dL (ref 0.61–1.24)
GFR, Estimated: >60 mL/min (ref >60)
GFR, Estimated: >60 mL/min (ref >60)
Glucose, Bld: 163 mg/dL — ABNORMAL HIGH (ref 70–99)
Glucose, Bld: 163 mg/dL — ABNORMAL HIGH (ref 70–99)
Potassium: 5.5 mmol/L — ABNORMAL HIGH (ref 3.5–5.1)
Potassium: 3.9 mmol/L (ref 3.5–5.1)
Sodium: 140 mmol/L (ref 135–145)
Sodium: 140 mmol/L (ref 135–145)

## 2023-06-10 LAB — BLOOD GAS, ARTERIAL
Acid-Base Excess: 17 mmol/L — ABNORMAL HIGH (ref 0.0–2.0)
Acid-Base Excess: 14.6 mmol/L — ABNORMAL HIGH (ref 0.0–2.0)
Acid-Base Excess: 14.9 mmol/L — ABNORMAL HIGH (ref 0.0–2.0)
Acid-Base Excess: 17.3 mmol/L — ABNORMAL HIGH (ref 0.0–2.0)
Bicarbonate: 50.1 mmol/L — ABNORMAL HIGH (ref 20.0–28.0)
Bicarbonate: 39.3 mmol/L — ABNORMAL HIGH (ref 20.0–28.0)
Bicarbonate: 40 mmol/L — ABNORMAL HIGH (ref 20.0–28.0)
Bicarbonate: 41.3 mmol/L — ABNORMAL HIGH (ref 20.0–28.0)
Drawn by: 422461
Drawn by: 422461
Drawn by: 422461
Drawn by: 31394
FIO2: 100 %
FIO2: 100 %
FIO2: 80 %
FIO2: 40 %
MECHVT: 600 mL
MECHVT: 600 mL
MECHVT: 600 mL
O2 Saturation: 100 %
O2 Saturation: 100 %
O2 Saturation: 100 %
O2 Saturation: 100 %
PEEP: 5 cmH2O
PEEP: 5 cmH2O
PEEP: 5 cmH2O
Patient temperature: 36.6
Patient temperature: 36.6
Patient temperature: 37
Patient temperature: 36.8
RATE: 24 {breaths}/min
RATE: 20 {breaths}/min
RATE: 20 {breaths}/min
pCO2 arterial: 115 mmHg (ref 32–48)
pCO2 arterial: 46 mmHg (ref 32–48)
pCO2 arterial: 49 mmHg — ABNORMAL HIGH (ref 32–48)
pCO2 arterial: 44 mmHg (ref 32–48)
pH, Arterial: 7.25 — ABNORMAL LOW (ref 7.35–7.45)
pH, Arterial: 7.54 — ABNORMAL HIGH (ref 7.35–7.45)
pH, Arterial: 7.52 — ABNORMAL HIGH (ref 7.35–7.45)
pH, Arterial: 7.58 — ABNORMAL HIGH (ref 7.35–7.45)
pO2, Arterial: 140 mmHg — ABNORMAL HIGH (ref 83–108)
pO2, Arterial: 225 mmHg — ABNORMAL HIGH (ref 83–108)
pO2, Arterial: 335 mmHg — ABNORMAL HIGH (ref 83–108)
pO2, Arterial: 102 mmHg (ref 83–108)

## 2023-06-10 LAB — MAGNESIUM
Magnesium: 2.5 mg/dL — ABNORMAL HIGH (ref 1.7–2.4)
Magnesium: 2.4 mg/dL (ref 1.7–2.4)

## 2023-06-10 LAB — CBC
HCT: 43.7 % (ref 39.0–52.0)
Hemoglobin: 12.9 g/dL — ABNORMAL LOW (ref 13.0–17.0)
MCH: 31.7 pg (ref 26.0–34.0)
MCHC: 29.5 g/dL — ABNORMAL LOW (ref 30.0–36.0)
MCV: 107.4 fL — ABNORMAL HIGH (ref 80.0–100.0)
Platelets: 169 10*3/uL (ref 150–400)
RBC: 4.07 MIL/uL — ABNORMAL LOW (ref 4.22–5.81)
RDW: 15.2 % (ref 11.5–15.5)
WBC: 11.3 10*3/uL — ABNORMAL HIGH (ref 4.0–10.5)
nRBC: 0.4 % — ABNORMAL HIGH (ref 0.0–0.2)

## 2023-06-10 LAB — CBC WITH DIFFERENTIAL/PLATELET
Abs Immature Granulocytes: 0.04 10*3/uL (ref 0.00–0.07)
Basophils Absolute: 0 10*3/uL (ref 0.0–0.1)
Basophils Relative: 0 %
Eosinophils Absolute: 0 10*3/uL (ref 0.0–0.5)
Eosinophils Relative: 0 %
HCT: 48 % (ref 39.0–52.0)
Hemoglobin: 14.3 g/dL (ref 13.0–17.0)
Immature Granulocytes: 0 %
Lymphocytes Relative: 7 %
Lymphs Abs: 0.8 10*3/uL (ref 0.7–4.0)
MCH: 31.8 pg (ref 26.0–34.0)
MCHC: 29.8 g/dL — ABNORMAL LOW (ref 30.0–36.0)
MCV: 106.7 fL — ABNORMAL HIGH (ref 80.0–100.0)
Monocytes Absolute: 1.3 10*3/uL — ABNORMAL HIGH (ref 0.1–1.0)
Monocytes Relative: 11 %
Neutro Abs: 9.9 10*3/uL — ABNORMAL HIGH (ref 1.7–7.7)
Neutrophils Relative %: 82 %
Platelets: 224 10*3/uL (ref 150–400)
RBC: 4.5 MIL/uL (ref 4.22–5.81)
RDW: 15.5 % (ref 11.5–15.5)
WBC: 12.1 10*3/uL — ABNORMAL HIGH (ref 4.0–10.5)
nRBC: 0.4 % — ABNORMAL HIGH (ref 0.0–0.2)

## 2023-06-10 LAB — URINALYSIS, ROUTINE W REFLEX MICROSCOPIC
Bacteria, UA: NONE SEEN
Bilirubin Urine: NEGATIVE
Glucose, UA: NEGATIVE mg/dL
Ketones, ur: NEGATIVE mg/dL
Leukocytes,Ua: NEGATIVE
Nitrite: NEGATIVE
Protein, ur: 100 mg/dL — AB
Specific Gravity, Urine: 1.024 (ref 1.005–1.030)
pH: 6 (ref 5.0–8.0)

## 2023-06-10 LAB — TROPONIN I (HIGH SENSITIVITY)
Troponin I (High Sensitivity): 24 ng/L — ABNORMAL HIGH (ref <18)
Troponin I (High Sensitivity): 27 ng/L — ABNORMAL HIGH (ref <18)

## 2023-06-10 LAB — COMPREHENSIVE METABOLIC PANEL WITH GFR
ALT: 45 U/L — ABNORMAL HIGH (ref 0–44)
AST: 25 U/L (ref 15–41)
Albumin: 3.9 g/dL (ref 3.5–5.0)
Alkaline Phosphatase: 63 U/L (ref 38–126)
Anion gap: 13 (ref 5–15)
BUN: 46 mg/dL — ABNORMAL HIGH (ref 8–23)
CO2: 33 mmol/L — ABNORMAL HIGH (ref 22–32)
Calcium: 8.8 mg/dL — ABNORMAL LOW (ref 8.9–10.3)
Chloride: 91 mmol/L — ABNORMAL LOW (ref 98–111)
Creatinine, Ser: 0.91 mg/dL (ref 0.61–1.24)
GFR, Estimated: >60 mL/min (ref >60)
Glucose, Bld: 167 mg/dL — ABNORMAL HIGH (ref 70–99)
Potassium: 4.4 mmol/L (ref 3.5–5.1)
Sodium: 137 mmol/L (ref 135–145)
Total Bilirubin: 1.2 mg/dL (ref 0.0–1.2)
Total Protein: 6.9 g/dL (ref 6.5–8.1)

## 2023-06-10 LAB — RAPID URINE DRUG SCREEN, HOSP PERFORMED
Amphetamines: NOT DETECTED
Barbiturates: NOT DETECTED
Benzodiazepines: NOT DETECTED
Cocaine: NOT DETECTED
Opiates: NOT DETECTED
Tetrahydrocannabinol: NOT DETECTED

## 2023-06-10 LAB — RESP PANEL BY RT-PCR (RSV, FLU A&B, COVID)  RVPGX2
Influenza A by PCR: NEGATIVE
Influenza B by PCR: NEGATIVE
Resp Syncytial Virus by PCR: NEGATIVE
SARS Coronavirus 2 by RT PCR: NEGATIVE

## 2023-06-10 LAB — GLUCOSE, CAPILLARY
Glucose-Capillary: 168 mg/dL — ABNORMAL HIGH (ref 70–99)
Glucose-Capillary: 162 mg/dL — ABNORMAL HIGH (ref 70–99)
Glucose-Capillary: 157 mg/dL — ABNORMAL HIGH (ref 70–99)
Glucose-Capillary: 148 mg/dL — ABNORMAL HIGH (ref 70–99)
Glucose-Capillary: 162 mg/dL — ABNORMAL HIGH (ref 70–99)

## 2023-06-10 LAB — BRAIN NATRIURETIC PEPTIDE: B Natriuretic Peptide: 610.4 pg/mL — ABNORMAL HIGH (ref 0.0–100.0)

## 2023-06-10 LAB — MRSA NEXT GEN BY PCR, NASAL: MRSA by PCR Next Gen: NOT DETECTED

## 2023-06-10 LAB — PHOSPHORUS: Phosphorus: 3.5 mg/dL (ref 2.5–4.6)

## 2023-06-10 LAB — CBG MONITORING, ED: Glucose-Capillary: 140 mg/dL — ABNORMAL HIGH (ref 70–99)

## 2023-06-10 LAB — ETHANOL: Alcohol, Ethyl (B): <15 mg/dL (ref <15)

## 2023-06-10 LAB — LACTIC ACID, PLASMA: Lactic Acid, Venous: 1.4 mmol/L (ref 0.5–1.9)

## 2023-06-10 LAB — I-STAT CG4 LACTIC ACID, ED
Lactic Acid, Venous: 1.7 mmol/L (ref 0.5–1.9)
Lactic Acid, Venous: 3.1 mmol/L (ref 0.5–1.9)

## 2023-06-10 MED ORDER — ROCURONIUM BROMIDE 10 MG/ML (PF) SYRINGE
PREFILLED_SYRINGE | INTRAVENOUS | Status: AC
Start: 2023-06-10 — End: 2023-06-10
  Filled 2023-06-10: qty 10

## 2023-06-10 MED ORDER — METHYLPREDNISOLONE SODIUM SUCC 125 MG IJ SOLR
60.0000 mg | Freq: Four times a day (QID) | INTRAMUSCULAR | Status: DC
  Administered 2023-06-10 – 2023-06-12 (×11): 60 mg via INTRAVENOUS
  Filled 2023-06-10 (×11): qty 2

## 2023-06-10 MED ORDER — ONDANSETRON HCL 4 MG/2ML IJ SOLN: 4.0000 mg | Freq: Four times a day (QID) | INTRAMUSCULAR | Status: DC | PRN

## 2023-06-10 MED ORDER — ORAL CARE MOUTH RINSE
15.0000 mL | OROMUCOSAL | Status: DC
  Administered 2023-06-10 – 2023-06-11 (×11): 15 mL via OROMUCOSAL

## 2023-06-10 MED ORDER — CHLORHEXIDINE GLUCONATE CLOTH 2 % EX PADS
6.0000 | MEDICATED_PAD | Freq: Every day | CUTANEOUS | Status: DC
  Administered 2023-06-10 – 2023-06-14 (×2): 6 via TOPICAL

## 2023-06-10 MED ORDER — ETOMIDATE 2 MG/ML IV SOLN
INTRAVENOUS | Status: AC
  Filled 2023-06-10: qty 20

## 2023-06-10 MED ORDER — SUCCINYLCHOLINE CHLORIDE 200 MG/10ML IV SOSY
PREFILLED_SYRINGE | INTRAVENOUS | Status: AC
  Filled 2023-06-10: qty 10

## 2023-06-10 MED ORDER — NOREPINEPHRINE 4 MG/250ML-% IV SOLN
2.0000 ug/min | INTRAVENOUS | Status: DC
  Administered 2023-06-10: 2 ug/min via INTRAVENOUS
  Filled 2023-06-10: qty 250

## 2023-06-10 MED ORDER — FENTANYL CITRATE PF 50 MCG/ML IJ SOSY
200.0000 ug | PREFILLED_SYRINGE | Freq: Once | INTRAMUSCULAR | Status: AC
  Administered 2023-06-10: 100 ug via INTRAVENOUS
  Filled 2023-06-10: qty 4

## 2023-06-10 MED ORDER — FAMOTIDINE 20 MG PO TABS
20.0000 mg | ORAL_TABLET | Freq: Two times a day (BID) | ORAL | Status: DC
  Administered 2023-06-10 (×3): 20 mg
  Filled 2023-06-10 (×4): qty 1

## 2023-06-10 MED ORDER — LACTATED RINGERS IV BOLUS
500.0000 mL | Freq: Once | INTRAVENOUS | Status: AC
  Administered 2023-06-10: 500 mL via INTRAVENOUS

## 2023-06-10 MED ORDER — SODIUM CHLORIDE 0.9 % IV BOLUS
1000.0000 mL | Freq: Once | INTRAVENOUS | Status: AC
  Administered 2023-06-10: 1000 mL via INTRAVENOUS

## 2023-06-10 MED ORDER — LEVALBUTEROL HCL 0.63 MG/3ML IN NEBU: 0.6300 mg | INHALATION_SOLUTION | Freq: Four times a day (QID) | RESPIRATORY_TRACT | Status: DC | PRN

## 2023-06-10 MED ORDER — BUDESONIDE 0.5 MG/2ML IN SUSP
0.5000 mg | Freq: Two times a day (BID) | RESPIRATORY_TRACT | Status: DC
  Administered 2023-06-10 – 2023-06-14 (×8): 0.5 mg via RESPIRATORY_TRACT
  Filled 2023-06-10 (×8): qty 2

## 2023-06-10 MED ORDER — ARFORMOTEROL TARTRATE 15 MCG/2ML IN NEBU
15.0000 ug | INHALATION_SOLUTION | Freq: Two times a day (BID) | RESPIRATORY_TRACT | Status: DC
  Administered 2023-06-10 – 2023-06-14 (×8): 15 ug via RESPIRATORY_TRACT
  Filled 2023-06-10 (×8): qty 2

## 2023-06-10 MED ORDER — ORAL CARE MOUTH RINSE: 15.0000 mL | OROMUCOSAL | Status: DC | PRN

## 2023-06-10 MED ORDER — SUCCINYLCHOLINE CHLORIDE 20 MG/ML IJ SOLN
INTRAMUSCULAR | Status: AC | PRN
  Administered 2023-06-10: 100 mg via INTRAVENOUS

## 2023-06-10 MED ORDER — IPRATROPIUM-ALBUTEROL 0.5-2.5 (3) MG/3ML IN SOLN
3.0000 mL | RESPIRATORY_TRACT | Status: DC
  Administered 2023-06-10 (×4): 3 mL via RESPIRATORY_TRACT
  Filled 2023-06-10 (×3): qty 3

## 2023-06-10 MED ORDER — SODIUM CHLORIDE 0.9 % IV SOLN
250.0000 mL | INTRAVENOUS | Status: AC
  Administered 2023-06-10: 250 mL via INTRAVENOUS

## 2023-06-10 MED ORDER — INSULIN ASPART 100 UNIT/ML IJ SOLN
0.0000 [IU] | INTRAMUSCULAR | Status: DC
  Administered 2023-06-10: 3 [IU] via SUBCUTANEOUS
  Administered 2023-06-10: 2 [IU] via SUBCUTANEOUS
  Administered 2023-06-10: 3 [IU] via SUBCUTANEOUS
  Administered 2023-06-10: 2 [IU] via SUBCUTANEOUS
  Administered 2023-06-10 – 2023-06-11 (×3): 3 [IU] via SUBCUTANEOUS
  Administered 2023-06-11: 2 [IU] via SUBCUTANEOUS
  Administered 2023-06-11: 3 [IU] via SUBCUTANEOUS
  Filled 2023-06-10: qty 0.15

## 2023-06-10 MED ORDER — PROPOFOL 1000 MG/100ML IV EMUL
5.0000 ug/kg/min | INTRAVENOUS | Status: DC
  Administered 2023-06-10: 5 ug/kg/min via INTRAVENOUS
  Administered 2023-06-10: 10 ug/kg/min via INTRAVENOUS

## 2023-06-10 MED ORDER — REVEFENACIN 175 MCG/3ML IN SOLN
175.0000 ug | Freq: Every day | RESPIRATORY_TRACT | Status: DC
  Administered 2023-06-10 – 2023-06-14 (×5): 175 ug via RESPIRATORY_TRACT
  Filled 2023-06-10 (×5): qty 3

## 2023-06-10 MED ORDER — SODIUM CHLORIDE 0.9 % IV SOLN
500.0000 mg | INTRAVENOUS | Status: AC
  Administered 2023-06-10 – 2023-06-14 (×5): 500 mg via INTRAVENOUS
  Filled 2023-06-10 (×5): qty 5

## 2023-06-10 MED ORDER — APIXABAN 5 MG PO TABS
5.0000 mg | ORAL_TABLET | Freq: Two times a day (BID) | ORAL | Status: DC
  Administered 2023-06-10 (×3): 5 mg via NASOGASTRIC
  Filled 2023-06-10 (×4): qty 1

## 2023-06-10 MED ORDER — ETOMIDATE 2 MG/ML IV SOLN
INTRAVENOUS | Status: AC | PRN
  Administered 2023-06-10: 20 mg via INTRAVENOUS

## 2023-06-10 MED ORDER — PROPOFOL 1000 MG/100ML IV EMUL
5.0000 ug/kg/min | INTRAVENOUS | Status: DC
  Administered 2023-06-10: 50 ug/kg/min via INTRAVENOUS
  Administered 2023-06-10: 30 ug/kg/min via INTRAVENOUS
  Administered 2023-06-10: 50 ug/kg/min via INTRAVENOUS
  Administered 2023-06-11: 60 ug/kg/min via INTRAVENOUS
  Administered 2023-06-11: 50 ug/kg/min via INTRAVENOUS
  Filled 2023-06-10 (×5): qty 100

## 2023-06-10 MED ORDER — PROPOFOL 1000 MG/100ML IV EMUL
INTRAVENOUS | Status: AC
  Administered 2023-06-10: 5 ug/kg/min via INTRAVENOUS
  Filled 2023-06-10: qty 100

## 2023-06-10 NOTE — ED Notes (Signed)
 Report given to [facility], Charity fundraiser.

## 2023-06-10 NOTE — Progress Notes (Signed)
 Sputum sample obtained and sent to the lab at this time.

## 2023-06-10 NOTE — Progress Notes (Addendum)
 Initial Nutrition Assessment  DOCUMENTATION CODES:   Non-severe (moderate) malnutrition in context of chronic illness  INTERVENTION:  - If patient remains intubated, would recommend below TF regimen: Vital 1.5 at 50 ml/h (1200 ml per day) *Would recommend starting at 78mL/hr and and advancing by 10mL Q12H Prosource TF20 60 ml BID Provides 1960 kcal, 121 gm protein, 917 ml free water daily  - Monitor magnesium , potassium, and phosphorus BID for at least 3 days, MD to replete as needed, as pt may be at risk for refeeding syndrome.   NUTRITION DIAGNOSIS:   Moderate Malnutrition related to chronic illness (COPD; CHF) as evidenced by moderate fat depletion, moderate muscle depletion.  GOAL:   Patient will meet greater than or equal to 90% of their needs  MONITOR:   Vent status, Labs, Weight trends  REASON FOR ASSESSMENT:   Ventilator    ASSESSMENT:    73 y.o. male with PMH significant of COPD, chronic hypoxic respiratory failure, combined CHF reduced EF, essential HTN and chronic pain syndrome who presented complaining of shortness of breath. Admitted for acute on chronic respiratory failure with COPD exacerbation.    4/30 Admit; Intubated  Patient is currently intubated on ventilator support MV: 12.4 L/min Temp (24hrs), Avg:98.8 F (37.1 C), Min:97.4 F (36.3 C), Max:99.3 F (37.4 C)  No family/visitors at bedside at time of visit.   Per chart review, patient was weighed at 190# back in July and October 2024 and dropped to 146# by February. Within that same month, he was weighed up to 168# and has since bee between 160-172#. Accurate weight status difficult to assess at this time.   Per discussion with CCM, no plan to start tube feeds today and can hopefully extubate tomorrow.  Will provide tube feed recs in case patient remains intubated. OGT in the proximal stomach per xray read.    Medications reviewed and include:  Propofol  @ 13.63mL/hr (provides 368 kcals over  24 hours)  Labs reviewed:  K+ 5.5 Magnesium  2.5 HA1C 6.1 (as of 05/06/23) Blood Glucose 140-168 since admit this AM   NUTRITION - FOCUSED PHYSICAL EXAM:  Flowsheet Row Most Recent Value  Orbital Region Moderate depletion  Upper Arm Region Severe depletion  Thoracic and Lumbar Region Moderate depletion  Buccal Region Moderate depletion  Temple Region Moderate depletion  Clavicle Bone Region Moderate depletion  Clavicle and Acromion Bone Region Moderate depletion  Scapular Bone Region Unable to assess  Dorsal Hand Mild depletion  Patellar Region Unable to assess  [edema]  Anterior Thigh Region Unable to assess  [edema]  Posterior Calf Region Unable to assess  [edema]  Edema (RD Assessment) Moderate  Hair Reviewed  Eyes Unable to assess  Mouth Unable to assess  Skin Reviewed  Nails Reviewed       Diet Order:   Diet Order             Diet NPO time specified Except for: Sips with Meds  Diet effective now                   EDUCATION NEEDS:  Not appropriate for education at this time  Skin:  Skin Assessment: Reviewed RN Assessment  Last BM:  PTA  Height:  Ht Readings from Last 1 Encounters:  06/10/23 5\' 11"  (1.803 m)   Weight:  Wt Readings from Last 1 Encounters:  06/10/23 77.4 kg   Ideal Body Weight:  78.18 kg  BMI:  Body mass index is 23.8 kg/m.  Estimated Nutritional Needs:  Kcal:  1950-2150 kcals Protein:  115-130 grams Fluid:  >/= 1.9L    Scheryl Cushing RD, LDN Contact via Secure Chat.

## 2023-06-10 NOTE — ED Provider Notes (Signed)
 [place] EMERGENCY DEPARTMENT AT [facility] Provider Note   CSN: [id number] Arrival date & time: 06/10/23  0102     History  Chief Complaint  Patient presents with   Respiratory Distress    Elster Peterson is a 73 y.o. male.  Patient brought to the emergency department from home with shortness of breath.  Patient with history of COPD.  Patient was reportedly hypoxic in in severe distress when EMS arrived.  Treated with bronchodilator therapy and Solu-Medrol  by EMS who reports some improvement.       Home Medications Prior to Admission medications   Medication Sig Start Date End Date Taking? Authorizing Provider  albuterol  (PROVENTIL ) (2.5 MG/3ML) 0.083% nebulizer solution Take 3 mLs (2.5 mg total) by nebulization every 4 (four) hours as needed for wheezing or shortness of breath. 05/16/23   Ghimire, Estil Heman, MD  aspirin  EC 81 MG tablet Take 1 tablet (81 mg total) by mouth daily. Swallow whole. 12/08/22   Ghimire, Estil Heman, MD  Cholecalciferol  (VITAMIN D -3 PO) Take 1 capsule by mouth daily.    [provider]  ELIQUIS  5 MG TABS tablet Take 1 tablet (5 mg total) by mouth 2 (two) times daily. 05/16/23   Ghimire, Estil Heman, MD  fluticasone  furoate-vilanterol (BREO ELLIPTA ) 100-25 MCG/ACT AEPB Inhale 1 puff into the lungs daily. Patient taking differently: Inhale 1 puff into the lungs daily as needed (shortness of breath). 05/16/23   Ghimire, Estil Heman, MD  folic acid  (FOLVITE ) 1 MG tablet Take 1 tablet (1 mg total) by mouth daily. 05/16/23   Ghimire, Estil Heman, MD  gabapentin  (NEURONTIN ) 400 MG capsule Take 1 capsule (400 mg total) by mouth daily as needed (for nerve pain). Patient taking differently: Take 400 mg by mouth daily. 05/16/23   Ghimire, Estil Heman, MD  linaclotide  (LINZESS ) 145 MCG CAPS capsule Take 1 capsule (145 mcg total) by mouth daily before breakfast. 05/16/23   Ghimire, Estil Heman, MD  metoprolol  succinate (TOPROL -XL) 25 MG 24 hr tablet Take 1 tablet  (25 mg total) by mouth at bedtime. 06/06/23 07/06/23  Unk Garb, DO  OXYGEN  Inhale 3 L into the lungs continuous.     [provider]  pantoprazole  (PROTONIX ) 40 MG tablet Take 1 tablet (40 mg total) by mouth daily as needed (acid reflux). Patient taking differently: Take 40 mg by mouth daily. 05/16/23   Ghimire, Estil Heman, MD  polyethylene glycol (MIRALAX  / GLYCOLAX ) 17 g packet Take 17 g by mouth daily. Patient taking differently: Take 17 g by mouth daily as needed for mild constipation. 03/15/19   Zoanne Hinders, MD  predniSONE  (DELTASONE ) 20 MG tablet Take 2 tablets (40 mg total) by mouth daily with breakfast for 3 days, THEN 1.5 tablets (30 mg total) daily with breakfast for 3 days, THEN 1 tablet (20 mg total) daily with breakfast for 3 days, THEN 0.5 tablets (10 mg total) daily with breakfast for 3 days. 06/06/23 06/18/23  Unk Garb, DO  senna (SENOKOT) 8.6 MG TABS tablet Take 1-2 tablets (8.6-17.2 mg total) by mouth daily. Patient taking differently: Take 1-2 tablets by mouth daily as needed for moderate constipation. 03/15/19   Zoanne Hinders, MD  SPIRIVA  RESPIMAT 2.5 MCG/ACT AERS Inhale 2 puffs into the lungs daily. Patient taking differently: Inhale 2 puffs into the lungs daily as needed (shortness of breath). 05/16/23   Ghimire, Estil Heman, MD  traZODone  (DESYREL ) 50 MG tablet Take 1 tablet (50 mg total) by mouth at bedtime as needed  for sleep. Patient taking differently: Take 50 mg by mouth daily. 05/16/23   Ghimire, Estil Heman, MD  VENTOLIN  HFA 108 (90 Base) MCG/ACT inhaler Inhale 1-2 puffs into the lungs every 6 (six) hours as needed for wheezing or shortness of breath 05/16/23   Ghimire, Estil Heman, MD      Allergies    Patient has no known allergies.    Review of Systems   Review of Systems  Physical Exam Updated Vital Signs BP 112/86   Pulse 94   Temp 97.9 F (36.6 C) (Axillary)   Resp 16   Ht 5\' 11"  (1.803 m)   Wt 77.4 kg   SpO2 99%   BMI 23.80 kg/m  Physical Exam Vitals and  nursing note reviewed.  Constitutional:      General: He is in acute distress.     Appearance: He is well-developed.  HENT:     Head: Normocephalic and atraumatic.     Mouth/Throat:     Mouth: Mucous membranes are moist.  Eyes:     General: Vision grossly intact. Gaze aligned appropriately.     Extraocular Movements: Extraocular movements intact.     Conjunctiva/sclera: Conjunctivae normal.  Cardiovascular:     Rate and Rhythm: Tachycardia present. Rhythm irregular.     Pulses: Normal pulses.     Heart sounds: Normal heart sounds, S1 normal and S2 normal. No murmur heard.    No friction rub. No gallop.  Pulmonary:     Effort: Tachypnea, accessory muscle usage and prolonged expiration present. No respiratory distress.     Breath sounds: Decreased air movement present. Wheezing present.  Abdominal:     Palpations: Abdomen is soft.     Tenderness: There is no abdominal tenderness. There is no guarding or rebound.     Hernia: No hernia is present.  Musculoskeletal:        General: No swelling.     Cervical back: Full passive range of motion without pain, normal range of motion and neck supple. No pain with movement, spinous process tenderness or muscular tenderness. Normal range of motion.     Right lower leg: No edema.     Left lower leg: No edema.  Skin:    General: Skin is warm and dry.     Capillary Refill: Capillary refill takes less than 2 seconds.     Findings: No ecchymosis, erythema, lesion or wound.  Neurological:     Mental Status: He is alert and oriented to person, place, and time.     GCS: GCS eye subscore is 4. GCS verbal subscore is 5. GCS motor subscore is 6.     Cranial Nerves: Cranial nerves 2-12 are intact.     Sensory: Sensation is intact.     Motor: Motor function is intact. No weakness or abnormal muscle tone.     Coordination: Coordination is intact.  Psychiatric:        Mood and Affect: Mood normal.        Speech: Speech normal.        Behavior:  Behavior normal.     ED Results / Procedures / Treatments   Labs (all labs ordered are listed, but only abnormal results are displayed) Labs Reviewed  BLOOD GAS, ARTERIAL - Abnormal; Notable for the following components:      Result Value   pH, Arterial 7.25 (*)    pCO2 arterial 115 (*)    pO2, Arterial 140 (*)    Bicarbonate 50.1 (*)  Acid-Base Excess 17.0 (*)    All other components within normal limits  CBC WITH DIFFERENTIAL/PLATELET - Abnormal; Notable for the following components:   WBC 12.1 (*)    MCV 106.7 (*)    MCHC 29.8 (*)    nRBC 0.4 (*)    Neutro Abs 9.9 (*)    Monocytes Absolute 1.3 (*)    All other components within normal limits  COMPREHENSIVE METABOLIC PANEL WITH GFR - Abnormal; Notable for the following components:   Chloride 91 (*)    CO2 33 (*)    Glucose, Bld 167 (*)    BUN 46 (*)    Calcium  8.8 (*)    ALT 45 (*)    All other components within normal limits  BRAIN NATRIURETIC PEPTIDE - Abnormal; Notable for the following components:   B Natriuretic Peptide 610.4 (*)    All other components within normal limits  TROPONIN I (HIGH SENSITIVITY) - Abnormal; Notable for the following components:   Troponin I (High Sensitivity) 24 (*)    All other components within normal limits  RESP PANEL BY RT-PCR (RSV, FLU A&B, COVID)  RVPGX2  CULTURE, BLOOD (ROUTINE X 2)  CULTURE, BLOOD (ROUTINE X 2)  ETHANOL  URINALYSIS, ROUTINE W REFLEX MICROSCOPIC  RAPID URINE DRUG SCREEN, HOSP PERFORMED  I-STAT CG4 LACTIC ACID, ED    EKG EKG Interpretation Date/Time:  Wednesday June 10 2023 01:13:32 EDT Ventricular Rate:  128 PR Interval:    QRS Duration:  95 QT Interval:  309 QTC Calculation: 451 R Axis:   267  Text Interpretation: Atrial fibrillation Abnormal R-wave progression, late transition Inferior infarct, old Confirmed by Ballard Bongo [phone]) on 06/10/2023 1:33:45 AM  Radiology DG Chest Port 1 View Result Date: 06/10/2023 CLINICAL DATA:  Dyspnea  EXAM: PORTABLE CHEST 1 VIEW COMPARISON:  None Available. FINDINGS: The lungs are symmetrically hyperinflated in keeping with changes of underlying COPD. The lungs are clear. No pneumothorax or pleural effusion. Cardiac size is within normal limits. Pulmonary vascularity is normal. Osseous structures are age appropriate. Multiple healed left rib fractures are noted. IMPRESSION: 1. No active disease. COPD. Electronically Signed   By: Worthy Heads M.D.   On: 06/10/2023 01:44    Procedures Procedures    Medications Ordered in ED Medications  succinylcholine (ANECTINE) 200 MG/10ML syringe (has no administration in time range)  etomidate (AMIDATE) 2 MG/ML injection (has no administration in time range)  rocuronium  (ZEMURON ) 100 MG/10ML injection (  Not Given 06/10/23 0228)  fentaNYL  (SUBLIMAZE ) injection 200 mcg (has no administration in time range)  propofol  (DIPRIVAN ) 1000 MG/100ML infusion (10 mg  New Bag/Given 06/10/23 0220)  etomidate (AMIDATE) injection (20 mg Intravenous Given 06/10/23 0214)  succinylcholine (ANECTINE) injection (100 mg Intravenous Given 06/10/23 0214)    ED Course/ Medical Decision Making/ A&P                                 Medical Decision Making Amount and/or Complexity of Data Reviewed Labs: ordered. Radiology: ordered.  Risk Prescription drug management.   Differential Diagnosis considered includes, but not limited to: COPD exacerbation; Bronchitis; Pneumonia; CHF; ACS; PE  Patient presents to the emergency department in respiratory distress.  Patient is very altered at arrival.  He falls asleep when not stimulated but does wake up with stimulation and attempt to answer questions.  He was borderline for BiPAP attempts due to level of arousal, but did not tolerate it  with a brief attempt as he became agitated and kept pulling it off.  Blood gas performed at arrival shows significant CO2 retention.  Decision was made to intubate the patient's.  Workup is  consistent with COPD exacerbation.  Will require admission to the ICU.  CRITICAL CARE Performed by: Ballard Bongo   Total critical care time: 35 minutes  Critical care time was exclusive of separately billable procedures and treating other patients.  Critical care was necessary to treat or prevent imminent or life-threatening deterioration.  Critical care was time spent personally by me on the following activities: development of treatment plan with patient and/or surrogate as well as nursing, discussions with consultants, evaluation of patient's response to treatment, examination of patient, obtaining history from patient or surrogate, ordering and performing treatments and interventions, ordering and review of laboratory studies, ordering and review of radiographic studies, pulse oximetry and re-evaluation of patient's condition.         Final Clinical Impression(s) / ED Diagnoses Final diagnoses:  Acute respiratory failure with hypoxia and hypercapnia (HCC)    Rx / DC Orders ED Discharge Orders     None         Kashmere Staffa, Marine Sia, MD 06/10/23 0236

## 2023-06-10 NOTE — Progress Notes (Signed)
 Pt transported on vent from ED to ICU room 1223 with no issues.

## 2023-06-10 NOTE — ED Provider Notes (Signed)
  Physical Exam  BP 112/86   Pulse 94   Temp 97.9 F (36.6 C) (Axillary)   Resp 16   Ht 5\' 11"  (1.803 m)   Wt 77.4 kg   SpO2 99%   BMI 23.80 kg/m   Physical Exam  Procedures  Procedure Name: Intubation Date/Time: 06/10/2023 2:21 AM  Performed by: Sherra Dk, PA-CPre-anesthesia Checklist: Patient identified, Emergency Drugs available, Suction available, Patient being monitored and Timeout performed Oxygen  Delivery Method: Ambu bag Preoxygenation: Pre-oxygenation with 100% oxygen  Induction Type: IV induction and Rapid sequence Ventilation: Mask ventilation without difficulty Laryngoscope Size: Glidescope and 3 Grade View: Grade I Tube size: 7.5 mm Number of attempts: 1 Placement Confirmation: ETT inserted through vocal cords under direct vision, Positive ETCO2, CO2 detector and Breath sounds checked- equal and bilateral Secured at: 25 cm Tube secured with: ETT holder      ED Course / MDM    Medical Decision Making Amount and/or Complexity of Data Reviewed Labs: ordered. Radiology: ordered.  Risk Prescription drug management.          Quinlan Mcfall A, PA-C 06/10/23 0225    Ballard Bongo, MD 06/10/23 0230

## 2023-06-10 NOTE — H&P (Signed)
 NAME:  Johnny Rodriguez, MRN:  [id number], DOB:  1950-06-13, LOS: 0 ADMISSION DATE:  06/10/2023, CONSULTATION DATE:  06/10/2023 REFERRING MD:  Dolores Freud, MD, CHIEF COMPLAINT:  SOB   History of Present Illness:   73 y.o. 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  He was briefly placed on BiPAP but failed quickly and was intubated in ED.   Patient discharged on 4/26 with same COPD exacerbation.   PCO2 as high as 115 in ED. 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 rate (!) 24, height 5\' 11"  (1.803 m), weight 77.4 kg, SpO2 99%.    Vent Mode: PRVC FiO2 (%):  [100 %] 100 % Set Rate:  [24 bmp] 24 bmp Vt Set:  [600 mL] 600 mL PEEP:  [5 cmH20] 5 cmH20 Plateau Pressure:  [20 cmH20] 20 cmH20  No intake or output data in the 24 hours ending 06/10/23 0249 Filed Weights   06/10/23 0115  Weight: 77.4 kg    Examination: General: sedated on mech vent HENT: perrla no icterus ETT in place Lungs: CTA no wheezes no rales, diminished bs b/l bases Cardiovascular: irreg irreg no gallops or rubs Abdomen: soft nt nd bs pos no guarding Extremities: no cyanosis, clubbing, positive b/l LE edema 2+ pitting Neuro:  sedated   Resolved Hospital Problem list   N/a  Assessment & Plan:  Acute on chronic hypoxic and hypercarbic respiratory failure  -continue with mechanical ventilation  -follow serial ABGs to adjust vent settings  -daily wean trials  -VAP prevention  -Hyperinflated Lung volumes on cxr, no infiltrates COPD exacerbation  -Solumedrol, nebs and vent management  -Pulmonary rehab as outpatient Acute bronchitis  -Azithromycin  started x 5 days Atrial fibrillation  -Continue with Eliquis  5mg  via OGT BID H/o Pulmonary emboli  -continue with Eliquis   -LE venous doppler H/o CHF  -keep negative balance  -Monitor I's/O's  Admit to ICU  Best Practice (right click and "Reselect all SmartList Selections" daily)   Diet/type: NPO w/ meds via tube DVT prophylaxis DOAC Pressure ulcer(s): N/A GI prophylaxis: H2B Lines: N/A Foley:  Yes, and it is still needed Code Status:  full code   Labs   CBC: Recent Labs  Lab 06/03/23 2346 06/03/23 2355 06/04/23 0326 06/04/23 0347 06/04/23 1016 06/05/23 0424 06/10/23 0117  WBC 6.3  --  5.4  --   --  8.0 12.1*  NEUTROABS 4.7  --   --   --   --  7.5 9.9*  HGB 13.1   < > 12.9* 13.9 12.9* 12.2* 14.3  HCT 42.4   < > 41.2 41.0 38.0* 38.5* 48.0  MCV 101.7*  --  101.7*  --   --  98.5 106.7*  PLT 141*  --  135*  --   --  122* 224   < > =  values in this interval not displayed.    Basic Metabolic Panel: Recent Labs  Lab 06/04/23 0118 06/04/23 0326 06/04/23 0347 06/04/23 1016 06/05/23 0424 06/06/23 0352 06/10/23 0117  NA 138 137 135 137 136 139 137  K 3.7 3.8 3.8 4.5 5.1 4.2 4.4  CL 89* 93*  --   --  92* 91* 91*  CO2 38* 33*  --   --  31 37* 33*  GLUCOSE 141* 162*  --   --  167* 123* 167*  BUN 10 10  --   --  23 23 46*  CREATININE 0.94 0.92  --   --  1.13 0.87 0.91  CALCIUM  8.8* 8.3*  --   --  8.8* 9.2 8.8*   GFR: Estimated Creatinine Clearance: 79.3 mL/min (by C-G formula based on SCr of 0.91 mg/dL). Recent Labs  Lab 06/03/23 2346  06/04/23 0008 06/04/23 0326 06/05/23 0424 06/10/23 0117 06/10/23 0127  WBC 6.3  --  5.4 8.0 12.1*  --   LATICACIDVEN  --  1.5  --   --   --  1.7    Liver Function Tests: Recent Labs  Lab 06/04/23 0118 06/04/23 0326 06/10/23 0117  AST 17 18 25   ALT 19 17 45*  ALKPHOS 55 52 63  BILITOT 0.7 0.7 1.2  PROT 6.2* 5.9* 6.9  ALBUMIN  3.3* 3.0* 3.9   No results for input(s): "LIPASE", "AMYLASE" in the last 168 hours. No results for input(s): "AMMONIA" in the last 168 hours.  ABG    Component Value Date/Time   PHART 7.25 (L) 06/10/2023 0142   PCO2ART 115 (HH) 06/10/2023 0142   PO2ART 140 (H) 06/10/2023 0142   HCO3 50.1 (H) 06/10/2023 0142   TCO2 44 (H) 06/04/2023 1016   ACIDBASEDEF 4.0 (H) 03/29/2017 1604   O2SAT 100 06/10/2023 0142     Coagulation Profile: Recent Labs  Lab 06/03/23 2346  INR 1.4*    Cardiac Enzymes: No results for input(s): "CKTOTAL", "CKMB", "CKMBINDEX", "TROPONINI" in the last 168 hours.  HbA1C: HbA1c, POC (prediabetic range)  Date/Time Value Ref Range Status  08/15/2022 01:34 PM 6.3 5.7 - 6.4 % Final   HbA1c, POC (controlled diabetic range)  Date/Time Value Ref Range Status  08/15/2022 03:08 PM 6.3 0.0 - 7.0 % Final   Hgb A1c MFr Bld  Date/Time Value Ref Range Status  05/06/2023 05:14 PM 6.1 (H) 4.8 - 5.6 % Final    Comment:    (NOTE) Pre diabetes:          5.7%-6.4%  Diabetes:              >6.4%  Glycemic control for   <7.0% adults with diabetes     CBG: Recent Labs  Lab 06/04/23 0731 06/04/23 1159 06/05/23 0020 06/05/23 0601 06/05/23 1145  GLUCAP 151* 135* 160* 191* 162*    Review of Systems:   Unable to perform, patient intubated and on mech vent  Past Medical History:  He,  has a past medical history of AF (atrial fibrillation) (HCC), Alcohol abuse, Angina pectoris, CAD (coronary artery disease) (12/15/2004), COPD (chronic obstructive pulmonary disease) (HCC), Coronary artery disease, Heavy cigarette smoker, HTN  (hypertension), Idiopathic cardiomyopathy (HCC) (07/16/2009), Non compliance with medical treatment, Pulmonary embolism (HCC) (03/27/2022), and Seizures (HCC).   Surgical History:   Past Surgical History:  Procedure Laterality Date   CARDIAC CATHETERIZATION  2006   CARDIAC CATHETERIZATION  12/2004   Left    CARDIOVERSION  01/21/2011   Procedure: CARDIOVERSION;  Surgeon:  Elaina Graver., MD;  Location: [facility];  Service: Cardiovascular;  Laterality: N/A;   DOPPLER ECHOCARDIOGRAPHY  02/2011   ELECTROPHYSIOLOGY STUDY N/A 12/16/2011   Procedure: ELECTROPHYSIOLOGY STUDY;  Surgeon: Tammie Fall, MD;  Location: [facility] CATH LAB;  Service: Cardiovascular;  Laterality: N/A;   EP IMPLANTABLE DEVICE N/A 08/24/2015   Procedure: Loop Recorder Removal;  Surgeon: Tammie Fall, MD;  Location: [facility] INVASIVE CV LAB;  Service: Cardiovascular;  Laterality: N/A;   fx collar bone     NO PAST SURGERIES       Social History:   reports that he has been smoking cigarettes. He has a 58 pack-year smoking history. He has never used smokeless tobacco. He reports current alcohol use. He reports current drug use. Drug: Marijuana.   Family History:  His family history includes COPD in his father; CVA in his mother; Coronary artery disease in his father; Heart disease in his father; Stroke in his mother; Suicidality in his sister.   Allergies No Known Allergies   Home Medications  Prior to Admission medications   Medication Sig Start Date End Date Taking? Authorizing Provider  albuterol  (PROVENTIL ) (2.5 MG/3ML) 0.083% nebulizer solution Take 3 mLs (2.5 mg total) by nebulization every 4 (four) hours as needed for wheezing or shortness of breath. 05/16/23   Ghimire, Estil Heman, MD  aspirin  EC 81 MG tablet Take 1 tablet (81 mg total) by mouth daily. Swallow whole. 12/08/22   Ghimire, Estil Heman, MD  Cholecalciferol  (VITAMIN D -3 PO) Take 1 capsule by mouth daily.    [provider]  ELIQUIS  5 MG TABS tablet Take 1  tablet (5 mg total) by mouth 2 (two) times daily. 05/16/23   Ghimire, Estil Heman, MD  fluticasone  furoate-vilanterol (BREO ELLIPTA ) 100-25 MCG/ACT AEPB Inhale 1 puff into the lungs daily. Patient taking differently: Inhale 1 puff into the lungs daily as needed (shortness of breath). 05/16/23   Ghimire, Estil Heman, MD  folic acid  (FOLVITE ) 1 MG tablet Take 1 tablet (1 mg total) by mouth daily. 05/16/23   Ghimire, Estil Heman, MD  gabapentin  (NEURONTIN ) 400 MG capsule Take 1 capsule (400 mg total) by mouth daily as needed (for nerve pain). Patient taking differently: Take 400 mg by mouth daily. 05/16/23   Ghimire, Estil Heman, MD  linaclotide  (LINZESS ) 145 MCG CAPS capsule Take 1 capsule (145 mcg total) by mouth daily before breakfast. 05/16/23   Ghimire, Estil Heman, MD  metoprolol  succinate (TOPROL -XL) 25 MG 24 hr tablet Take 1 tablet (25 mg total) by mouth at bedtime. 06/06/23 07/06/23  Unk Garb, DO  OXYGEN  Inhale 3 L into the lungs continuous.     [provider]  pantoprazole  (PROTONIX ) 40 MG tablet Take 1 tablet (40 mg total) by mouth daily as needed (acid reflux). Patient taking differently: Take 40 mg by mouth daily. 05/16/23   Ghimire, Estil Heman, MD  polyethylene glycol (MIRALAX  / GLYCOLAX ) 17 g packet Take 17 g by mouth daily. Patient taking differently: Take 17 g by mouth daily as needed for mild constipation. 03/15/19   Zoanne Hinders, MD  predniSONE  (DELTASONE ) 20 MG tablet Take 2 tablets (40 mg total) by mouth daily with breakfast for 3 days, THEN 1.5 tablets (30 mg total) daily with breakfast for 3 days, THEN 1 tablet (20 mg total) daily with breakfast for 3 days, THEN 0.5 tablets (10 mg total) daily with breakfast for 3 days. 06/06/23 06/18/23  Unk Garb, DO  senna (SENOKOT) 8.6 MG TABS tablet Take  1-2 tablets (8.6-17.2 mg total) by mouth daily. Patient taking differently: Take 1-2 tablets by mouth daily as needed for moderate constipation. 03/15/19   Zoanne Hinders, MD  SPIRIVA  RESPIMAT 2.5 MCG/ACT AERS  Inhale 2 puffs into the lungs daily. Patient taking differently: Inhale 2 puffs into the lungs daily as needed (shortness of breath). 05/16/23   Ghimire, Estil Heman, MD  traZODone  (DESYREL ) 50 MG tablet Take 1 tablet (50 mg total) by mouth at bedtime as needed for sleep. Patient taking differently: Take 50 mg by mouth daily. 05/16/23   Ghimire, Estil Heman, MD  VENTOLIN  HFA 108 (90 Base) MCG/ACT inhaler Inhale 1-2 puffs into the lungs every 6 (six) hours as needed for wheezing or shortness of breath 05/16/23   Ghimire, Estil Heman, MD     Critical care time: 74   The patient is critically ill with multiple organ system failure and requires high complexity decision making for assessment and support, frequent evaluation and titration of therapies, advanced monitoring, review of radiographic studies and interpretation of complex data.   Critical Care Time devoted to patient care services, exclusive of separately billable procedures, described in this note is 35 minutes.   Claven Cumming, MD [facility] See Amion for pager  If no response to pager , please call [phone] until 7pm After 7:00 pm call Elink  [phone] 06/10/2023, 2:49 AM

## 2023-06-10 NOTE — ED Notes (Signed)
 Pt refused Bipap.

## 2023-06-10 NOTE — TOC Initial Note (Addendum)
 Transition of Care [facility]) - Initial/Assessment Note    Patient Details  Name: Johnny Rodriguez MRN: [id number] Date of Birth: 1950/11/25  [facility]) CM/SW Contact:    Amaryllis Junior, LCSW Phone Number: 06/10/2023, 12:48 PM  Clinical Narrative:                 Pt from home alone. Pt currently on vent. Pt continues medical workup. TOC following for needs.    Barriers to Discharge: Continued Medical Work up   Patient Goals and CMS Choice Patient states their goals for this hospitalization and ongoing recovery are:: return home CMS Medicare.gov Compare Post Acute Care list provided to::  (NA) Choice offered to / list presented to : NA [facility] ownership interest in [facility].provided to::  (NA)    Expected Discharge Plan and Services       Living arrangements for the past 2 months: Apartment                 DME Arranged: N/A DME Agency: NA       HH Arranged: NIV HH Agency: NA        Prior Living Arrangements/Services Living arrangements for the past 2 months: Apartment Lives with:: Self Patient language and need for interpreter reviewed:: Yes Do you feel safe going back to the place where you live?: Yes      Need for Family Participation in Patient Care: Yes (Comment) Care giver support system in place?: Yes (comment)   Criminal Activity/Legal Involvement Pertinent to Current Situation/Hospitalization: No - Comment as needed  Activities of Daily Living      Permission Sought/Granted                  Emotional Assessment         Alcohol / Substance Use: Not Applicable Psych Involvement: No (comment)  Admission diagnosis:  Acute respiratory failure with hypoxia and hypercapnia (HCC) [J96.01, J96.02] Acute hypoxic on chronic hypercapnic respiratory failure (HCC) [J96.01, J96.12] Patient Active Problem List   Diagnosis Date Noted   Acute hypoxic on chronic hypercapnic respiratory failure (HCC) 06/10/2023   History of  pulmonary embolism 06/04/2023   Atrial fibrillation, chronic (HCC) 06/04/2023   Chronic pain syndrome 06/04/2023   Insomnia 06/04/2023   Thrombocytopenia (HCC) 06/04/2023   Malnutrition of moderate degree 05/07/2023   Prediabetes 08/15/2022   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   Dyspnea secondary to COPD 03/26/2022   COPD with acute exacerbation (HCC) 03/11/2019   Chronic hypoxic respiratory failure (HCC) - chronically on 3 L/min continuously, baseline PCO2 around high 80s to low 90s on VBG 10/15/2016   Continuous dependence on cigarette smoking 09/09/2016   Long term current use of anticoagulant therapy 06/24/2016   History of syncope 06/24/2016   Acute on chronic respiratory failure with hypoxia and hypercapnia (HCC) - baseline PCO2 around high 80s to low 90s on VBG 02/16/2016   Essential hypertension 02/08/2015   History of noncompliance with medical treatment 12/01/2011   Chronic systolic CHF (congestive heart failure) (HCC) 04/10/2011   Idiopathic cardiomyopathy (HCC) 07/16/2009   Paroxysmal atrial fibrillation (HCC) 07/16/2009   COPD PFT's pending  07/16/2009   CAD (coronary artery disease) 12/15/2004   PCP:  Pcp, No Pharmacy:   [facility] - [place], [place] - [address] [place] [address] Phone: [phone] Fax: [phone]  [facility] [address] [place] [address] Phone: [phone] Fax: [phone]  [id number] Jonette Nestle, [place] - [address] [place]  [address] Phone: [phone] Fax: [phone]  [facility] - [place], [place] - [address] [place] [phone] Phone: [phone] Fax: [phone]  [id number] - [address] ([place]), [place] - [address] [place] [address] Phone: [phone] Fax: [phone]     Social Drivers of Health  (SDOH) Social History: SDOH Screenings   Food Insecurity: Patient Unable To Answer (06/10/2023)  Housing: Patient Unable To Answer (06/10/2023)  Transportation Needs: Patient Unable To Answer (06/10/2023)  Recent Concern: Transportation Needs - Unmet Transportation Needs (05/18/2023)  Utilities: Patient Unable To Answer (06/10/2023)  Alcohol Screen: Low Risk  (09/11/2020)  Recent Concern: Alcohol Screen - Medium Risk (08/07/2020)  Depression (PHQ2-9): Low Risk  (11/21/2022)  Financial Resource Strain: Low Risk  (11/21/2022)  Physical Activity: Inactive (11/21/2022)  Social Connections: Patient Unable To Answer (06/10/2023)  Recent Concern: Social Connections - Socially Isolated (05/18/2023)  Stress: No Stress Concern Present (11/21/2022)  Tobacco Use: High Risk (06/10/2023)  Health Literacy: Adequate Health Literacy (11/21/2022)   SDOH Interventions:     Readmission Risk Interventions    06/10/2023   12:46 PM  Readmission Risk Prevention Plan  Transportation Screening Complete  PCP or Specialist Appt within 3-5 Days Complete  HRI or Home Care Consult Complete  Social Work Consult for Recovery Care Planning/Counseling Complete  Palliative Care Screening Not Applicable  Medication Review [employer]) Complete

## 2023-06-10 NOTE — ED Triage Notes (Signed)
 Pt BIBA from home, c/o respiratory distress.  On arrival 60 on 3L at home. 3L at baseline.  Hx of COPD, A fib, CHF. 8L on arrival to ER. Magnesium , solumedrol, and duoneb.

## 2023-06-11 DIAGNOSIS — J9621 Acute and chronic respiratory failure with hypoxia: Principal | ICD-10-CM

## 2023-06-11 DIAGNOSIS — J441 Chronic obstructive pulmonary disease with (acute) exacerbation: Secondary | ICD-10-CM

## 2023-06-11 DIAGNOSIS — I4891 Unspecified atrial fibrillation: Secondary | ICD-10-CM | POA: Diagnosis not present

## 2023-06-11 DIAGNOSIS — J9622 Acute and chronic respiratory failure with hypercapnia: Secondary | ICD-10-CM | POA: Diagnosis not present

## 2023-06-11 LAB — GLUCOSE, CAPILLARY
Glucose-Capillary: 172 mg/dL — ABNORMAL HIGH (ref 70–99)
Glucose-Capillary: 148 mg/dL — ABNORMAL HIGH (ref 70–99)
Glucose-Capillary: 153 mg/dL — ABNORMAL HIGH (ref 70–99)
Glucose-Capillary: 161 mg/dL — ABNORMAL HIGH (ref 70–99)
Glucose-Capillary: 175 mg/dL — ABNORMAL HIGH (ref 70–99)

## 2023-06-11 LAB — CBC
HCT: 42.7 % (ref 39.0–52.0)
Hemoglobin: 13 g/dL (ref 13.0–17.0)
MCH: 31.6 pg (ref 26.0–34.0)
MCHC: 30.4 g/dL (ref 30.0–36.0)
MCV: 103.6 fL — ABNORMAL HIGH (ref 80.0–100.0)
Platelets: 198 10*3/uL (ref 150–400)
RBC: 4.12 MIL/uL — ABNORMAL LOW (ref 4.22–5.81)
RDW: 16.8 % — ABNORMAL HIGH (ref 11.5–15.5)
WBC: 8.4 10*3/uL (ref 4.0–10.5)
nRBC: 0.2 % (ref 0.0–0.2)

## 2023-06-11 LAB — TRIGLYCERIDES: Triglycerides: 124 mg/dL (ref <150)

## 2023-06-11 LAB — BASIC METABOLIC PANEL WITH GFR
Anion gap: 12 (ref 5–15)
BUN: 37 mg/dL — ABNORMAL HIGH (ref 8–23)
CO2: 33 mmol/L — ABNORMAL HIGH (ref 22–32)
Calcium: 8.5 mg/dL — ABNORMAL LOW (ref 8.9–10.3)
Chloride: 93 mmol/L — ABNORMAL LOW (ref 98–111)
Creatinine, Ser: 0.7 mg/dL (ref 0.61–1.24)
GFR, Estimated: >60 mL/min (ref >60)
Glucose, Bld: 150 mg/dL — ABNORMAL HIGH (ref 70–99)
Potassium: 3.7 mmol/L (ref 3.5–5.1)
Sodium: 138 mmol/L (ref 135–145)

## 2023-06-11 MED ORDER — APIXABAN 5 MG PO TABS
5.0000 mg | ORAL_TABLET | Freq: Two times a day (BID) | ORAL | Status: DC
  Administered 2023-06-11 – 2023-06-14 (×7): 5 mg via ORAL
  Filled 2023-06-11 (×6): qty 1

## 2023-06-11 MED ORDER — METOPROLOL TARTRATE 5 MG/5ML IV SOLN: 5.0000 mg | Freq: Three times a day (TID) | INTRAVENOUS | Status: DC | PRN

## 2023-06-11 MED ORDER — INSULIN ASPART 100 UNIT/ML IJ SOLN
0.0000 [IU] | Freq: Three times a day (TID) | INTRAMUSCULAR | Status: DC
  Administered 2023-06-11 – 2023-06-12 (×3): 3 [IU] via SUBCUTANEOUS
  Administered 2023-06-12: 8 [IU] via SUBCUTANEOUS
  Administered 2023-06-12 (×2): 5 [IU] via SUBCUTANEOUS
  Administered 2023-06-13: 3 [IU] via SUBCUTANEOUS
  Administered 2023-06-13: 8 [IU] via SUBCUTANEOUS
  Administered 2023-06-13: 3 [IU] via SUBCUTANEOUS
  Administered 2023-06-14: 5 [IU] via SUBCUTANEOUS

## 2023-06-11 MED ORDER — APIXABAN 5 MG PO TABS: 5.0000 mg | ORAL_TABLET | Freq: Two times a day (BID) | ORAL | Status: DC

## 2023-06-11 MED ORDER — SODIUM CHLORIDE 0.9 % IV SOLN
1.0000 g | INTRAVENOUS | Status: DC
  Administered 2023-06-11 – 2023-06-14 (×4): 1 g via INTRAVENOUS
  Filled 2023-06-11 (×5): qty 10

## 2023-06-11 MED ORDER — ASPIRIN 81 MG PO TBEC
81.0000 mg | DELAYED_RELEASE_TABLET | Freq: Every day | ORAL | Status: DC
  Administered 2023-06-11 – 2023-06-14 (×4): 81 mg via ORAL
  Filled 2023-06-11 (×4): qty 1

## 2023-06-11 MED ORDER — ENSURE ENLIVE PO LIQD
237.0000 mL | Freq: Two times a day (BID) | ORAL | Status: DC
  Administered 2023-06-12 – 2023-06-14 (×6): 237 mL via ORAL

## 2023-06-11 MED ORDER — FAMOTIDINE 20 MG PO TABS
20.0000 mg | ORAL_TABLET | Freq: Two times a day (BID) | ORAL | Status: DC
  Administered 2023-06-11 – 2023-06-14 (×6): 20 mg via ORAL
  Filled 2023-06-11 (×6): qty 1

## 2023-06-11 NOTE — Progress Notes (Signed)
 Patient was extubated to BiPAP per MD order.   BiPAP in place and patient is doing well at this time.

## 2023-06-11 NOTE — Procedures (Signed)
 Extubation Procedure Note  Patient Details:   Name: Johnny Rodriguez DOB: [birth year] MRN: [id number]   Airway Documentation:    Vent end date: 06/11/23 Vent end time: 0916   Evaluation  O2 sats: stable throughout Complications: No apparent complications Patient did tolerate procedure well. Bilateral Breath Sounds: Diminished, Clear   Yes  Fernanda Howell 06/11/2023, 9:17 AM

## 2023-06-11 NOTE — Plan of Care (Signed)
  Problem: Coping: Goal: Ability to adjust to condition or change in health will improve Outcome: Progressing   Problem: Fluid Volume: Goal: Ability to maintain a balanced intake and output will improve Outcome: Not Progressing

## 2023-06-11 NOTE — Progress Notes (Signed)
   06/11/23 1220  Spiritual Encounters  Type of Visit Initial  Care provided to: Patient  Reason for visit Routine spiritual support   While rounding on unit I introduced myself and briefly engaged Mr. Ertman. He received my visit positively but was about to receive some personal care at this time. Will return for follow up or he knows he can request a chaplain visit through his care team.  Latish Toutant L. Minetta Aly, M.Div [phone]

## 2023-06-11 NOTE — Progress Notes (Signed)
 NAME:  Johnny Rodriguez, MRN:  [id number], DOB:  May 25, 1950, LOS: 1 ADMISSION DATE:  06/10/2023, CONSULTATION DATE:  06/10/2023 REFERRING MD:  Dolores Freud, MD, CHIEF COMPLAINT:  SOB   History of Present Illness:   73 y.o. 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  He was briefly placed on BiPAP but failed quickly and was intubated in ED.   Patient discharged on 4/26 with same COPD exacerbation.   PCO2 as high as 115 in ED. 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 rate 15, height 5\' 11"  (1.803 m), weight 79.1 kg, SpO2 96%.    Vent Mode: PRVC FiO2 (%):  [30 %-50 %] 30 % Set Rate:  [15 bmp-20 bmp] 15 bmp Vt Set:  [600 mL] 600 mL PEEP:  [5 cmH20] 5 cmH20 Plateau Pressure:  [14 cmH20-23 cmH20] 14 cmH20   Intake/Output Summary (Last 24 hours) at 06/11/2023 0810 Last data filed at 06/11/2023 4098 Gross per 24 hour  Intake 869.74 ml  Output 900 ml  Net -30.26 ml   Filed Weights   06/10/23 0115 06/11/23 0500  Weight: 77.4 kg 79.1 kg    General:  ill appearing elderly M awake and intubated moderately agitated  HEENT: MM pink/moist, ETT in place Neuro: awake and alert, trying to communicate CV:  s1s2 , no m/r/g PULM:  mildly diminished air entry bilaterally, no wheezing or rhonchi  GI: soft, bsx4 active  Extremities: warm/dry, no edema  Skin: no rashes or lesions  Micro: 4/30 Bcx2>NGTD 4/30 Sputum cx>gm - rods 4/30 RVP>negative   Abx 4/29 [facility] Problem list   N/a  Assessment & Plan:    Acute on chronic hypoxic and hypercarbic respiratory failure secondary to COPD exacerbation and acute bronchitis SBT and likely extubate  -Maintain full vent support with SAT/SBT as tolerated -titrate Vent setting to maintain SpO2 greater than or equal to 90%. -HOB elevated 30 degrees. -Plateau pressures less than 30 cm H20.  -Follow chest x-ray, ABG prn.   -Bronchial hygiene and RT/bronchodilator protocol. -continue steroids and pulmicort , brovana , yupelri  nebs -add Ceftriaxone  for gram neg rods on sputum cx      Atrial fibrillation H/o Pulmonary emboli H/o HFrEF HR improved today Echo last month with EF 35-40% -Continue with Eliquis  5mg  via OGT BID -does not appear volume overloaded, net + 700cc, not on diuretics at home -resume Asa 81mg    Best Practice (right click and "Reselect all SmartList Selections" daily)   Diet/type: NPO w/ meds via tube DVT prophylaxis DOAC Pressure ulcer(s): N/A GI prophylaxis: H2B Lines: N/A Foley:  Yes, and it is still needed Code Status:  full code   Labs   CBC: Recent Labs  Lab 06/04/23 1016 06/05/23 0424 06/10/23 0117 06/10/23 0332  WBC  --  8.0  12.1* 11.3*  NEUTROABS  --  7.5 9.9*  --   HGB 12.9* 12.2* 14.3 12.9*  HCT 38.0* 38.5* 48.0 43.7  MCV  --  98.5 106.7* 107.4*  PLT  --  122* 224 169    Basic Metabolic Panel: Recent Labs  Lab 06/05/23 0424 06/06/23 0352 06/10/23 0117 06/10/23 0332 06/10/23 1721  NA 136 139 137 140 140  K 5.1 4.2 4.4 5.5* 3.9  CL 92* 91* 91* 92* 97*  CO2 31 37* 33* 35* 33*  GLUCOSE 167* 123* 167* 163* 163*  BUN 23 23 46* 44* 38*  CREATININE 1.13 0.87 0.91 0.71  0.72  CALCIUM  8.8* 9.2 8.8* 8.4* 8.6*  MG  --   --   --  2.5* 2.4  PHOS  --   --   --  3.5  --    GFR: Estimated Creatinine Clearance: 90.2 mL/min (by C-G formula based on SCr of 0.72 mg/dL). Recent Labs  Lab 06/05/23 0424 06/10/23 0117 06/10/23 0127 06/10/23 0332 06/10/23 0422 06/10/23 1640  WBC 8.0 12.1*  --  11.3*  --   --   LATICACIDVEN  --   --  1.7  --  3.1* 1.4    Liver Function Tests: Recent Labs  Lab 06/10/23 0117  AST 25  ALT 45*  ALKPHOS 63  BILITOT 1.2  PROT 6.9  ALBUMIN  3.9   No results for input(s): "LIPASE", "AMYLASE" in the last 168 hours. No results for input(s): "AMMONIA" in the last 168 hours.  ABG    Component Value Date/Time   PHART 7.58 (H) 06/10/2023 1314   PCO2ART 44 06/10/2023 1314   PO2ART 102 06/10/2023 1314   HCO3 41.3 (H) 06/10/2023 1314   TCO2 44 (H) 06/04/2023 1016   ACIDBASEDEF 4.0 (H) 03/29/2017 1604   O2SAT 100 06/10/2023 1314     Coagulation Profile: No results for input(s): "INR", "PROTIME" in the last 168 hours.   Cardiac Enzymes: No results for input(s): "CKTOTAL", "CKMB", "CKMBINDEX", "TROPONINI" in the last 168 hours.  HbA1C: HbA1c, POC (prediabetic range)  Date/Time Value Ref Range Status  08/15/2022 01:34 PM 6.3 5.7 - 6.4 % Final   HbA1c, POC (controlled diabetic range)  Date/Time Value Ref Range Status  08/15/2022 03:08 PM 6.3 0.0 - 7.0 % Final   Hgb A1c MFr Bld  Date/Time Value Ref Range Status  05/06/2023 05:14 PM 6.1 (H) 4.8 - 5.6 % Final    Comment:    (NOTE) Pre diabetes:          5.7%-6.4%  Diabetes:              >6.4%  Glycemic control for   <7.0% adults with diabetes     CBG: Recent Labs  Lab 06/10/23 1540 06/10/23 2002 06/10/23 2324 06/11/23 0335 06/11/23 0758  GLUCAP 157* 148* 162* 172* 148*    Review of Systems:   Unable to perform, patient intubated and on mech vent  Past Medical History:  He,  has a past medical history of AF (atrial fibrillation) (HCC), Alcohol abuse,  Angina pectoris, CAD (coronary artery disease) (12/15/2004), COPD (chronic obstructive pulmonary disease) (HCC), Coronary artery disease, Heavy cigarette smoker, HTN (hypertension), Idiopathic cardiomyopathy (HCC) (07/16/2009), Non compliance with medical treatment, Pulmonary embolism (HCC) (03/27/2022), and Seizures (HCC).   Surgical History:   Past Surgical History:  Procedure Laterality Date   CARDIAC CATHETERIZATION  2006   CARDIAC CATHETERIZATION  12/2004   Left    CARDIOVERSION  01/21/2011   Procedure: CARDIOVERSION;  Surgeon: Elaina Graver., MD;  [facility] OR;  Service: Cardiovascular;  Laterality: N/A;   DOPPLER ECHOCARDIOGRAPHY  02/2011   ELECTROPHYSIOLOGY STUDY N/A 12/16/2011   Procedure: ELECTROPHYSIOLOGY STUDY;  Surgeon: Tammie Fall, MD;  Location: [facility] CATH LAB;  Service: Cardiovascular;  Laterality: N/A;   EP IMPLANTABLE DEVICE N/A 08/24/2015   Procedure: Loop Recorder Removal;  Surgeon: Tammie Fall, MD;  Location: [facility] INVASIVE CV LAB;  Service: Cardiovascular;  Laterality: N/A;   fx collar bone     NO PAST SURGERIES       Social History:   reports that he has been smoking cigarettes. He has a 58 pack-year smoking history. He has never used smokeless tobacco. He reports current alcohol use. He reports current drug use. Drug: Marijuana.   Family History:  His family history includes COPD in his father; CVA in his mother; Coronary artery disease in his father; Heart disease in his father; Stroke in his mother; Suicidality in his sister.   Allergies No Known Allergies   Home Medications  Prior to Admission medications   Medication Sig Start Date End Date Taking? Authorizing Provider  albuterol  (PROVENTIL ) (2.5 MG/3ML) 0.083% nebulizer solution Take 3 mLs (2.5 mg total) by nebulization every 4 (four) hours as needed for wheezing or shortness of breath. 05/16/23   Ghimire, Estil Heman, MD  aspirin  EC 81 MG tablet Take 1 tablet (81 mg total) by mouth daily. Swallow  whole. 12/08/22   Ghimire, Estil Heman, MD  Cholecalciferol  (VITAMIN D -3 PO) Take 1 capsule by mouth daily.    [provider]  ELIQUIS  5 MG TABS tablet Take 1 tablet (5 mg total) by mouth 2 (two) times daily. 05/16/23   Ghimire, Estil Heman, MD  fluticasone  furoate-vilanterol (BREO ELLIPTA ) 100-25 MCG/ACT AEPB Inhale 1 puff into the lungs daily. Patient taking differently: Inhale 1 puff into the lungs daily as needed (shortness of breath). 05/16/23   Ghimire, Estil Heman, MD  folic acid  (FOLVITE ) 1 MG tablet Take 1 tablet (1 mg total) by mouth daily. 05/16/23   Ghimire, Estil Heman, MD  gabapentin  (NEURONTIN ) 400 MG capsule Take 1 capsule (400 mg total) by mouth daily as needed (for nerve pain). Patient taking differently: Take 400 mg by mouth daily. 05/16/23   Ghimire, Estil Heman, MD  linaclotide  (LINZESS ) 145 MCG CAPS capsule Take 1 capsule (145 mcg total) by mouth daily before breakfast. 05/16/23   Ghimire, Estil Heman, MD  metoprolol  succinate (TOPROL -XL) 25 MG 24 hr tablet Take 1 tablet (25 mg total) by mouth at bedtime. 06/06/23 07/06/23  Unk Garb, DO  OXYGEN  Inhale 3 L into the lungs continuous.     [provider]  pantoprazole  (PROTONIX ) 40 MG tablet Take 1 tablet (40 mg total) by mouth daily as needed (acid reflux). Patient taking differently: Take 40 mg by mouth daily. 05/16/23   Ghimire, Estil Heman, MD  polyethylene glycol (MIRALAX  / GLYCOLAX ) 17 g packet Take 17 g by mouth daily. Patient taking differently: Take 17 g by mouth daily as needed for mild constipation. 03/15/19   Zoanne Hinders, MD  predniSONE  (DELTASONE ) 20 MG tablet Take 2 tablets (40 mg total) by mouth daily with breakfast for 3 days, THEN 1.5 tablets (30 mg total) daily with breakfast for 3 days, THEN 1 tablet (20 mg total) daily with breakfast for 3 days, THEN 0.5 tablets (10 mg total) daily with breakfast for 3 days. 06/06/23 06/18/23  Unk Garb, DO  senna (SENOKOT) 8.6 MG TABS tablet  Take 1-2 tablets (8.6-17.2 mg total) by mouth  daily. Patient taking differently: Take 1-2 tablets by mouth daily as needed for moderate constipation. 03/15/19   Zoanne Hinders, MD  SPIRIVA  RESPIMAT 2.5 MCG/ACT AERS Inhale 2 puffs into the lungs daily. Patient taking differently: Inhale 2 puffs into the lungs daily as needed (shortness of breath). 05/16/23   Ghimire, Estil Heman, MD  traZODone  (DESYREL ) 50 MG tablet Take 1 tablet (50 mg total) by mouth at bedtime as needed for sleep. Patient taking differently: Take 50 mg by mouth daily. 05/16/23   Ghimire, Estil Heman, MD  VENTOLIN  HFA 108 (90 Base) MCG/ACT inhaler Inhale 1-2 puffs into the lungs every 6 (six) hours as needed for wheezing or shortness of breath 05/16/23   Ghimire, Estil Heman, MD     Critical care time: 25     CRITICAL CARE Performed by: Patt Boozer Elna Radovich   Total critical care time: 32 minutes  Critical care time was exclusive of separately billable procedures and treating other patients.  Critical care was necessary to treat or prevent imminent or life-threatening deterioration.  Critical care was time spent personally by me on the following activities: development of treatment plan with patient and/or surrogate as well as nursing, discussions with consultants, evaluation of patient's response to treatment, examination of patient, obtaining history from patient or surrogate, ordering and performing treatments and interventions, ordering and review of laboratory studies, ordering and review of radiographic studies, pulse oximetry and re-evaluation of patient's condition.    Patt Boozer Coretta Leisey, PA-C [facility] See Amion for pager If no response to pager , please call [phone] until 7pm After 7:00 pm call Elink  [phone]

## 2023-06-12 DIAGNOSIS — I5042 Chronic combined systolic (congestive) and diastolic (congestive) heart failure: Secondary | ICD-10-CM | POA: Diagnosis not present

## 2023-06-12 DIAGNOSIS — F17210 Nicotine dependence, cigarettes, uncomplicated: Secondary | ICD-10-CM

## 2023-06-12 DIAGNOSIS — J9621 Acute and chronic respiratory failure with hypoxia: Secondary | ICD-10-CM | POA: Diagnosis not present

## 2023-06-12 DIAGNOSIS — J441 Chronic obstructive pulmonary disease with (acute) exacerbation: Secondary | ICD-10-CM | POA: Diagnosis not present

## 2023-06-12 DIAGNOSIS — I480 Paroxysmal atrial fibrillation: Secondary | ICD-10-CM

## 2023-06-12 DIAGNOSIS — I10 Essential (primary) hypertension: Secondary | ICD-10-CM | POA: Diagnosis not present

## 2023-06-12 DIAGNOSIS — R531 Weakness: Secondary | ICD-10-CM

## 2023-06-12 DIAGNOSIS — E440 Moderate protein-calorie malnutrition: Secondary | ICD-10-CM

## 2023-06-12 LAB — CULTURE, RESPIRATORY W GRAM STAIN

## 2023-06-12 LAB — BASIC METABOLIC PANEL WITH GFR
Anion gap: 11 (ref 5–15)
BUN: 34 mg/dL — ABNORMAL HIGH (ref 8–23)
CO2: 31 mmol/L (ref 22–32)
Calcium: 8.3 mg/dL — ABNORMAL LOW (ref 8.9–10.3)
Chloride: 97 mmol/L — ABNORMAL LOW (ref 98–111)
Creatinine, Ser: 0.84 mg/dL (ref 0.61–1.24)
GFR, Estimated: >60 mL/min (ref >60)
Glucose, Bld: 225 mg/dL — ABNORMAL HIGH (ref 70–99)
Potassium: 4.6 mmol/L (ref 3.5–5.1)
Sodium: 139 mmol/L (ref 135–145)

## 2023-06-12 LAB — CBC
HCT: 43.3 % (ref 39.0–52.0)
Hemoglobin: 12.6 g/dL — ABNORMAL LOW (ref 13.0–17.0)
MCH: 31.7 pg (ref 26.0–34.0)
MCHC: 29.1 g/dL — ABNORMAL LOW (ref 30.0–36.0)
MCV: 108.8 fL — ABNORMAL HIGH (ref 80.0–100.0)
Platelets: 176 10*3/uL (ref 150–400)
RBC: 3.98 MIL/uL — ABNORMAL LOW (ref 4.22–5.81)
RDW: 16.4 % — ABNORMAL HIGH (ref 11.5–15.5)
WBC: 7.6 10*3/uL (ref 4.0–10.5)
nRBC: 0.3 % — ABNORMAL HIGH (ref 0.0–0.2)

## 2023-06-12 LAB — GLUCOSE, CAPILLARY
Glucose-Capillary: 163 mg/dL — ABNORMAL HIGH (ref 70–99)
Glucose-Capillary: 248 mg/dL — ABNORMAL HIGH (ref 70–99)
Glucose-Capillary: 206 mg/dL — ABNORMAL HIGH (ref 70–99)
Glucose-Capillary: 283 mg/dL — ABNORMAL HIGH (ref 70–99)

## 2023-06-12 LAB — MAGNESIUM: Magnesium: 2.3 mg/dL (ref 1.7–2.4)

## 2023-06-12 MED ORDER — METOPROLOL TARTRATE 5 MG/5ML IV SOLN
2.5000 mg | Freq: Once | INTRAVENOUS | Status: AC
  Administered 2023-06-12: 2.5 mg via INTRAVENOUS
  Filled 2023-06-12: qty 5

## 2023-06-12 MED ORDER — PANTOPRAZOLE SODIUM 40 MG PO TBEC
40.0000 mg | DELAYED_RELEASE_TABLET | Freq: Every day | ORAL | Status: DC
  Administered 2023-06-12 – 2023-06-14 (×3): 40 mg via ORAL
  Filled 2023-06-12 (×3): qty 1

## 2023-06-12 MED ORDER — TRAZODONE HCL 50 MG PO TABS
50.0000 mg | ORAL_TABLET | Freq: Every day | ORAL | Status: DC
  Administered 2023-06-12 – 2023-06-13 (×2): 50 mg via ORAL
  Filled 2023-06-12 (×2): qty 1

## 2023-06-12 MED ORDER — LEVALBUTEROL HCL 0.63 MG/3ML IN NEBU
0.6300 mg | INHALATION_SOLUTION | Freq: Four times a day (QID) | RESPIRATORY_TRACT | Status: DC
  Administered 2023-06-12: 0.63 mg via RESPIRATORY_TRACT
  Filled 2023-06-12: qty 3

## 2023-06-12 MED ORDER — GABAPENTIN 400 MG PO CAPS
400.0000 mg | ORAL_CAPSULE | Freq: Every day | ORAL | Status: DC
  Administered 2023-06-12 – 2023-06-14 (×3): 400 mg via ORAL
  Filled 2023-06-12 (×3): qty 1

## 2023-06-12 MED ORDER — POLYETHYLENE GLYCOL 3350 17 G PO PACK: 17.0000 g | PACK | Freq: Every day | ORAL | Status: DC | PRN

## 2023-06-12 MED ORDER — METOPROLOL SUCCINATE ER 25 MG PO TB24: 25.0000 mg | ORAL_TABLET | Freq: Every day | ORAL | Status: DC

## 2023-06-12 MED ORDER — METHYLPREDNISOLONE SODIUM SUCC 125 MG IJ SOLR
60.0000 mg | Freq: Two times a day (BID) | INTRAMUSCULAR | Status: DC
  Administered 2023-06-12 – 2023-06-13 (×3): 60 mg via INTRAVENOUS
  Filled 2023-06-12 (×3): qty 2

## 2023-06-12 MED ORDER — ADULT MULTIVITAMIN W/MINERALS CH
1.0000 | ORAL_TABLET | Freq: Every day | ORAL | Status: DC
  Administered 2023-06-13 – 2023-06-14 (×2): 1 via ORAL
  Filled 2023-06-12 (×2): qty 1

## 2023-06-12 MED ORDER — LINACLOTIDE 145 MCG PO CAPS
145.0000 ug | ORAL_CAPSULE | Freq: Every day | ORAL | Status: DC
  Administered 2023-06-13 – 2023-06-14 (×2): 145 ug via ORAL
  Filled 2023-06-12 (×2): qty 1

## 2023-06-12 MED ORDER — METOPROLOL TARTRATE 25 MG PO TABS
25.0000 mg | ORAL_TABLET | Freq: Two times a day (BID) | ORAL | Status: DC
  Administered 2023-06-12 – 2023-06-13 (×2): 25 mg via ORAL
  Filled 2023-06-12 (×2): qty 1

## 2023-06-12 NOTE — TOC Progression Note (Signed)
 Transition of Care [facility]) - Progression Note    Patient Details  Name: Johnny Rodriguez MRN: [id number] Date of Birth: [birth year]  Transition of Care [facility]) CM/SW Contact  Amaryllis Junior, [place] Phone Number: 06/12/2023, 2:39 PM  Clinical Narrative:    Pt recommended for SNF. Pt accepting of recommendation. PASRR obtained [id number] A and FL2 completed. SNF referral faxed out and awaiting bed offers.     Barriers to Discharge: Continued Medical Work up  Expected Discharge Plan and Services       Living arrangements for the past 2 months: Apartment                 DME Arranged: N/A DME Agency: NA       HH Arranged: NIV HH Agency: NA         Social Determinants of Health (SDOH) Interventions SDOH Screenings   Food Insecurity: Patient Unable To Answer (06/10/2023)  Housing: Patient Unable To Answer (06/10/2023)  Transportation Needs: Patient Unable To Answer (06/10/2023)  Recent Concern: Transportation Needs - Unmet Transportation Needs (05/18/2023)  Utilities: Patient Unable To Answer (06/10/2023)  Alcohol Screen: Low Risk  (09/11/2020)  Recent Concern: Alcohol Screen - Medium Risk (08/07/2020)  Depression (PHQ2-9): Low Risk  (11/21/2022)  Financial Resource Strain: Low Risk  (11/21/2022)  Physical Activity: Inactive (11/21/2022)  Social Connections: Patient Unable To Answer (06/10/2023)  Recent Concern: Social Connections - Socially Isolated (05/18/2023)  Stress: No Stress Concern Present (11/21/2022)  Tobacco Use: High Risk (06/10/2023)  Health Literacy: Adequate Health Literacy (11/21/2022)    Readmission Risk Interventions    06/10/2023   12:46 PM  Readmission Risk Prevention Plan  Transportation Screening Complete  PCP or Specialist Appt within 3-5 Days Complete  HRI or Home Care Consult Complete  Social Work Consult for Recovery Care Planning/Counseling Complete  Palliative Care Screening Not Applicable  Medication Review Oceanographer) Complete

## 2023-06-12 NOTE — Evaluation (Signed)
 -Physical Therapy Evaluation Patient Details Name: Johnny Rodriguez MRN: [id number] DOB: 1950/07/02 Today's Date: 06/12/2023  History of Present Illness  73 y.o. male presented to emergency department 06/10/23 complaining of shortness of breath. He was briefly placed on BiPAP but failed quickly and was intubated in ED Patient was extubated 06/11/23. 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  Clinical Impression  Pt admitted with above diagnosis.  Pt currently with functional limitations due to the deficits listed below (see PT Problem List). Pt will benefit from acute skilled PT to increase their independence and safety with mobility to allow discharge.     The patient  reports  limited ambulation  with a rollator, uses power chair majority of time, resides alone with ? Some inhome assistance( patient could not quite explain), sounds as if since last admission, in home help just started.  Patient  with HR 109 - 140' with activity, Patient remained on 3 LPM, with SPO 87-890 %.  Patient reports plans to return home. Patient may benefit from short rehab. Patient will benefit from continued inpatient follow up therapy, <3 hours/day.      If plan is discharge home, recommend the following: Two people to help with walking and/or transfers;A little help with bathing/dressing/bathroom;Assistance with cooking/housework;Help with stairs or ramp for entrance;Assist for transportation   Can travel by private vehicle        Equipment Recommendations None recommended by PT  Recommendations for Other Services       Functional Status Assessment Patient has had a recent decline in their functional status and demonstrates the ability to make significant improvements in function  in a reasonable and predictable amount of time.     Precautions / Restrictions Precautions Precautions: Fall Recall of Precautions/Restrictions: Intact Precaution/Restrictions Comments: watch sats and  HR Restrictions Weight Bearing Restrictions Per Provider Order: No      Mobility  Bed Mobility         Supine to sit: Mod assist     General bed mobility comments: assist with legs and trunk to move to sitting onto bed edge.    Transfers Overall transfer level: Needs assistance Equipment used: Rolling walker (2 wheels) Transfers: Sit to/from Stand, Bed to chair/wheelchair/BSC Sit to Stand: Mod assist   Step pivot transfers: Mod assist       General transfer comment: patient noted to be shakey and  unsteady with  step pivot to recliner. Noted 4/4 dyspnea, HR up to 142, SPO2 87% on 3 L.    Ambulation/Gait                  Stairs            Wheelchair Mobility     Tilt Bed    Modified Rankin (Stroke Patients Only)       Balance Overall balance assessment: Needs assistance Sitting-balance support: Feet supported, Bilateral upper extremity supported Sitting balance-Leahy Scale: Fair     Standing balance support: Reliant on assistive device for balance, Bilateral upper extremity supported, During functional activity Standing balance-Leahy Scale: Poor                               Pertinent Vitals/Pain Pain Assessment Pain Assessment: No/denies pain  Home Living Family/patient expects to be discharged to:: Private residence Living Arrangements: Alone Available Help at Discharge: Friend(s);Available PRN/intermittently Type of Home: Apartment Home Access: Level entry       Home Layout: One level Home Equipment: Rollator (4 wheels);Wheelchair - power Additional Comments: Pt lives alone, has a friend who can help occasionally.    Prior Function Prior Level of Function : Independent/Modified Independent              Mobility Comments: only walks short distances in the apartment with rollator; mostly uses power chair inside and community ADLs Comments: reports he drives his powerchair to get his groceries     Extremity/Trunk Assessment   Upper Extremity Assessment LUE Deficits / Details: history of L shoulder fx, painful ROM, limited overhead, reaching, not able to reach behind head, painful to bring hand to touch nose.    Lower Extremity Assessment Lower Extremity Assessment: Generalized weakness (noted left lower leg edema)       Communication        Cognition Arousal: Alert Behavior During Therapy: WFL for tasks assessed/performed   PT - Cognitive impairments: Awareness                         Following commands: Intact       Cueing Cueing Techniques: Verbal cues     General Comments      Exercises     Assessment/Plan    PT Assessment Patient needs continued PT services  PT Problem List Decreased strength;Decreased activity tolerance;Decreased balance;Decreased mobility;Decreased knowledge of use of DME;Decreased safety awareness;Cardiopulmonary status limiting activity       PT Treatment Interventions DME instruction;Gait training;Functional mobility training;Therapeutic activities;Therapeutic exercise;Cognitive remediation;Patient/family education    PT Goals (Current goals can be found in the Care Plan section)  Acute Rehab PT Goals Patient Stated Goal: return home PT Goal Formulation: With patient Time For Goal Achievement: 06/26/23 Potential to Achieve Goals: Fair    Frequency Min 2X/week     Co-evaluation               AM-PAC PT "6 Clicks" Mobility  Outcome Measure Help needed turning from your back to your side while in a flat bed without using bedrails?: A Little Help needed moving from lying on your back to sitting on the side of a flat bed without using bedrails?: A Little Help needed moving to and from a bed to a chair (including a  wheelchair)?: A Lot Help needed standing up from a chair using your arms (e.g., wheelchair or bedside chair)?: A Lot Help needed to walk in hospital room?: Total Help needed climbing 3-5 steps with a railing? : Total 6 Click Score: 12    End of Session Equipment Utilized During Treatment: Gait belt;Oxygen  Activity Tolerance: Patient limited by fatigue Patient left: with call bell/phone within reach;in chair;with chair alarm set Nurse Communication: Mobility status PT Visit Diagnosis: Muscle weakness (generalized) (M62.81);Other abnormalities of gait and mobility (R26.89);Unsteadiness on feet (R26.81);Difficulty in walking, not elsewhere classified (R26.2)    Time: 9147-8295 PT Time Calculation (min) (ACUTE ONLY): 27 min   Charges:   PT Evaluation $PT Eval Low Complexity: 1 Low PT Treatments $Therapeutic Activity: 8-22 mins PT General Charges $$ ACUTE PT VISIT: 1 Visit       Abelina Hoes PT Acute Rehabilitation Services Office [phone]     Dareen Ebbing 06/12/2023, 1:14 PM

## 2023-06-12 NOTE — Assessment & Plan Note (Signed)
 Nutrition consulted, their assistance is appreciated Encouraging oral intake Patient intermittently refusing nutritional supplements

## 2023-06-12 NOTE — Assessment & Plan Note (Signed)
 Some notable peripheral edema on exam but otherwise no obvious evidence of cardiogenic volume overload Strict input and output monitoring Daily weights Low-sodium diet

## 2023-06-12 NOTE — Assessment & Plan Note (Signed)
 Primarily patient seems to be suffering from COPD exacerbation with possible superimposed acute bacterial bronchitis or even early pneumonia On exam, no evidence of contributing congestive heart failure Remainder of assessment and plan as above

## 2023-06-12 NOTE — Progress Notes (Signed)
 Nutrition Follow-up  DOCUMENTATION CODES:   Non-severe (moderate) malnutrition in context of chronic illness  INTERVENTION:  - Regular diet.  - Ensure Plus High Protein po BID, each supplement provides 350 kcal and 20 grams of protein. - Multivitamin with minerals daily - Monitor weight trends.   NUTRITION DIAGNOSIS:   Moderate Malnutrition related to chronic illness (COPD; CHF) as evidenced by moderate fat depletion, moderate muscle depletion. *ongoing  GOAL:   Patient will meet greater than or equal to 90% of their needs *progressing  MONITOR:   Vent status, Labs, Weight trends  REASON FOR ASSESSMENT:   Ventilator    ASSESSMENT:   73 y.o. male with PMH significant of COPD, chronic hypoxic respiratory failure, combined CHF reduced EF, essential HTN and chronic pain syndrome who presented complaining of shortness of breath. Admitted for acute on chronic respiratory failure with COPD exacerbation.  4/30 Admit; Intubated  5/1 Extubated; CLD -> FLD -> DYS 3 diet 5/2 Regular diet  Patient working with PT at time of visit.  He was extubated yesterday and able to advance to a regular diet today. Noted to have had 75% of lunch yesterday. Refused an Ensure yesterday but per RN accepted one today.    Admit weight: 170# Current weight: 174# I&O's: +371mL since admit  Medications reviewed and include: -  Labs reviewed:  HA1C 6.1 (as of 05/06/23) Blood Glucose 148-163 x24 hours   Diet Order:   Diet Order             Diet regular Room service appropriate? Yes; Fluid consistency: Thin  Diet effective now                   EDUCATION NEEDS:  Not appropriate for education at this time  Skin:  Skin Assessment: Reviewed RN Assessment  Last BM:  5/1 - type 5  Height:  Ht Readings from Last 1 Encounters:  06/11/23 5\' 11"  (1.803 m)   Weight:  Wt Readings from Last 1 Encounters:  06/11/23 79.1 kg   Ideal Body Weight:  78.18 kg  BMI:  Body mass index is  24.32 kg/m.  Estimated Nutritional Needs:  Kcal:  1950-2150 kcals Protein:  95-115 grams Fluid:  >/= 1.9L    Scheryl Cushing RD, LDN Contact via Secure Chat.

## 2023-06-12 NOTE — Assessment & Plan Note (Signed)
 Continue metoprolol As needed intravenous antihypertensives for markedly elevated blood pressure.

## 2023-06-12 NOTE — Assessment & Plan Note (Signed)
 Rapid atrial fibrillation with heart rates in excess of 120 bpm Writing patient with intermittent doses of intravenous metoprolol  resuming home regimen of oral metoprolol  Monitoring patient on telemetry Continuing Eliquis 

## 2023-06-12 NOTE — Progress Notes (Signed)
 PROGRESS NOTE   Johnny Rodriguez  VHQ:[id number] DOB: 03-08-1950 DOA: 06/10/2023 PCP: Pcp, No   Date of Service: the patient was seen and examined on 06/12/2023  Brief Narrative:  73 y.o. male with medical history significant of COPD, chronic hypoxic respiratory failure on 3 L oxygen  at baseline, paroxysmal atrial fibrillation and pulmonary embolism (03/2022) on Eliquis , systolic and diastolic congestive heart failure with EF 35 to 40%, essential hypertension and chronic pain syndrome presented to emergency department on 4/30 complaining of shortness of breath.    Patient was found respiratory distress thought to be secondary to COPD exacerbation.  Patient was briefly placed on BiPAP but failed quickly and was intubated in ED. patient was initially admitted to the [facility] service.    Patient was managed with steroids, bronchodilator therapy, Pulmicort  and Brovana .  Ceftriaxone  was added due to gram-negative rods found in sputum culture.  Patient was extubated successfully morning of 5/1.  Patient was accepted to the hospitalist service beginning 5/2.   Assessment & Plan Acute on chronic respiratory failure with hypoxia and hypercapnia (HCC) Readmitted after recent discharge from hospital service on 4/26 for similar presentation. Doing well status post extubation on 5/2. Intermittent BiPAP therapy for shortness of breath, lethargy and sleep Reducing Solu-Medrol  to 60 mg every 12 Xopenex  every 6 hours while awake Completing 5-day course of empiric antibiotics considering severity of illness Patient is on 3 L of oxygen  via nasal cannula at baseline COPD with acute exacerbation (HCC) Primarily patient seems to be suffering from COPD exacerbation with possible superimposed acute bacterial bronchitis or even early pneumonia On exam, no evidence of contributing congestive heart failure Remainder of assessment and plan as above Paroxysmal atrial fibrillation with rapid ventricular response  (HCC) Rapid atrial fibrillation with heart rates in excess of 120 bpm Writing patient with intermittent doses of intravenous metoprolol  resuming home regimen of oral metoprolol  Monitoring patient on telemetry Continuing Eliquis  Chronic combined systolic and diastolic congestive heart failure (HCC) Some notable peripheral edema on exam but otherwise no obvious evidence of cardiogenic volume overload Strict input and output monitoring Daily weights Low-sodium diet Essential hypertension Continue metoprolol  As needed intravenous antihypertensives for markedly elevated blood pressure. Nicotine  dependence, cigarettes, uncomplicated Counseling on cessation daily Generalized weakness Patient experiencing significant generalized weakness due to repeated hospitalizations Physical evaluation by physical therapy today revealing patient has a skilled need for physical therapy in a skilled nursing facility Protein-calorie malnutrition, moderate (HCC) Nutrition consulted, their assistance is appreciated Encouraging oral intake Patient intermittently refusing nutritional supplements     Subjective:  Patient states that his shortness of breath is improving.  Patient complaining of associated generalized weakness.  Physical Exam:  Vitals:   06/12/23 1200 06/12/23 1600 06/12/23 1900 06/12/23 1922  BP:   107/66   Pulse:   (!) 115   Resp:   (!) 21   Temp: 97.6 F (36.4 C) 98 F (36.7 C)    TempSrc: Oral Oral    SpO2:   95% 91%  Weight:      Height:        Constitutional: Awake alert and oriented x3, no associated distress.   Skin: no rashes, no lesions, good skin turgor noted. Eyes: Pupils are equally reactive to light.  No evidence of scleral icterus or conjunctival pallor.  ENMT: Moist mucous membranes noted.  Posterior pharynx clear of any exudate or lesions.   Respiratory: Diminished breath sounds, minimal wheezing, no rales.  Normal respiratory effort. No accessory muscle use.   Cardiovascular:  Tachycardiic rate with irregularly irregular rhythm no murmurs / rubs / gallops. No extremity edema. 2+ pedal pulses. No carotid bruits.  Abdomen: Abdomen is soft and nontender.  No evidence of intra-abdominal masses.  Positive bowel sounds noted in all quadrants.   Musculoskeletal: No joint deformity upper and lower extremities. Good ROM, no contractures. Normal muscle tone.    Data Reviewed:  I have personally reviewed and interpreted labs, imaging.  Significant findings are   CBC: Recent Labs  Lab 06/10/23 0117 06/10/23 0332 06/11/23 0827 06/12/23 0259  WBC 12.1* 11.3* 8.4 7.6  NEUTROABS 9.9*  --   --   --   HGB 14.3 12.9* 13.0 12.6*  HCT 48.0 43.7 42.7 43.3  MCV 106.7* 107.4* 103.6* 108.8*  PLT 224 169 198 176   Basic Metabolic Panel: Recent Labs  Lab 06/10/23 0117 06/10/23 0332 06/10/23 1721 06/11/23 0827 06/12/23 0259 06/12/23 1207  NA 137 140 140 138 139  --   K 4.4 5.5* 3.9 3.7 4.6  --   CL 91* 92* 97* 93* 97*  --   CO2 33* 35* 33* 33* 31  --   GLUCOSE 167* 163* 163* 150* 225*  --   BUN 46* 44* 38* 37* 34*  --   CREATININE 0.91 0.71 0.72 0.70 0.84  --   CALCIUM  8.8* 8.4* 8.6* 8.5* 8.3*  --   MG  --  2.5* 2.4  --   --  2.3  PHOS  --  3.5  --   --   --   --    GFR: Estimated Creatinine Clearance: 85.9 mL/min (by C-G formula based on SCr of 0.84 mg/dL). Liver Function Tests: Recent Labs  Lab 06/10/23 0117  AST 25  ALT 45*  ALKPHOS 63  BILITOT 1.2  PROT 6.9  ALBUMIN  3.9    Coagulation Profile: No results for input(s): "INR", "PROTIME" in the last 168 hours.  Telemetry: Personally reviewed.  Rhythm is atrial fibrillation with heart rate of 120 bpm.  No dynamic ST segment changes appreciated.   Code Status:  Full code.  Code status decision has been confirmed with: patient    Severity of Illness:  The appropriate patient status for this patient is INPATIENT. Inpatient status is judged to be reasonable and necessary in order to  provide the required intensity of service to ensure the patient's safety. The patient's presenting symptoms, physical exam findings, and initial radiographic and laboratory data in the context of their chronic comorbidities is felt to place them at high risk for further clinical deterioration. Furthermore, it is not anticipated that the patient will be medically stable for discharge from the hospital within 2 midnights of admission.   * I certify that at the point of admission it is my clinical judgment that the patient will require inpatient hospital care spanning beyond 2 midnights from the point of admission due to high intensity of service, high risk for further deterioration and high frequency of surveillance required.*  Time spent:  54 minutes  Author:  True Fuss MD  06/12/2023 7:56 PM

## 2023-06-12 NOTE — Assessment & Plan Note (Signed)
 Patient experiencing significant generalized weakness due to repeated hospitalizations Physical evaluation by physical therapy today revealing patient has a skilled need for physical therapy in a skilled nursing facility

## 2023-06-12 NOTE — Assessment & Plan Note (Signed)
 Readmitted after recent discharge from hospital service on 4/26 for similar presentation. Doing well status post extubation on 5/2. Intermittent BiPAP therapy for shortness of breath, lethargy and sleep Reducing Solu-Medrol  to 60 mg every 12 Xopenex  every 6 hours while awake Completing 5-day course of empiric antibiotics considering severity of illness Patient is on 3 L of oxygen  via nasal cannula at baseline

## 2023-06-12 NOTE — Hospital Course (Addendum)
 73 y.o. male with medical history significant of COPD, chronic hypoxic respiratory failure on 3 L oxygen  at baseline, paroxysmal atrial fibrillation and pulmonary embolism (03/2022) on Eliquis , systolic and diastolic congestive heart failure with EF 35 to 40%, essential hypertension and chronic pain syndrome presented to emergency department on 4/30 complaining of shortness of breath.    Patient was found respiratory distress thought to be secondary to COPD exacerbation.  Patient was briefly placed on BiPAP but failed quickly and was intubated in ED. patient was initially admitted to the [facility] service.    Patient was managed with steroids, bronchodilator therapy, Pulmicort  and Brovana .  Ceftriaxone  was added due to gram-negative rods found in sputum culture.  Patient was extubated successfully morning of 5/1.  Patient was accepted to the hospitalist service beginning 5/2.

## 2023-06-12 NOTE — NC FL2 (Signed)
 [place]  MEDICAID FL2 LEVEL OF CARE FORM     IDENTIFICATION  Patient Name: Johnny Rodriguez Birthdate: [birth year] Sex: male Admission Date (Current Location): 06/10/2023  [facility] and [place] Number:  Producer, television/film/video and Address:  [facility],  [address], [place] [address]      Provider Number: [id number]  Attending Physician Name and Address:  True Fuss, MD  Relative Name and Phone Number:  Shea Denier (Friend)  [phone] [facility])    Current Level of Care: Hospital Recommended Level of Care: Skilled Nursing Facility Prior Approval Number:    Date Approved/Denied:   PASRR Number: [id number]  Discharge Plan: SNF    Current Diagnoses: Patient Active Problem List   Diagnosis Date Noted   Acute hypoxic on chronic hypercapnic respiratory failure (HCC) 06/10/2023   History of pulmonary embolism 06/04/2023   Atrial fibrillation, chronic (HCC) 06/04/2023   Chronic pain syndrome 06/04/2023   Insomnia 06/04/2023   Thrombocytopenia (HCC) 06/04/2023   Malnutrition of moderate degree 05/07/2023   Prediabetes 08/15/2022   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   Dyspnea secondary to COPD 03/26/2022   COPD with acute exacerbation (HCC) 03/11/2019   Chronic hypoxic respiratory failure (HCC) - chronically on 3 L/min continuously, baseline PCO2 around high 80s to low 90s on VBG 10/15/2016   Continuous dependence on cigarette smoking 09/09/2016   Long term current use of anticoagulant therapy 06/24/2016   History of syncope 06/24/2016   Acute on chronic respiratory failure with hypoxia and hypercapnia (HCC) - baseline PCO2 around high 80s to low 90s on VBG 02/16/2016   Essential hypertension 02/08/2015   History of noncompliance with medical treatment 12/01/2011   Chronic systolic CHF (congestive heart failure) (HCC) 04/10/2011   Idiopathic cardiomyopathy (HCC) 07/16/2009   Paroxysmal atrial fibrillation (HCC) 07/16/2009   COPD PFT's pending  07/16/2009    CAD (coronary artery disease) 12/15/2004    Orientation RESPIRATION BLADDER Height & Weight     Place, Self  O2 (3L O2) Continent Weight: 174 lb 6.1 oz (79.1 kg) Height:  5\' 11"  (180.3 cm)  BEHAVIORAL SYMPTOMS/MOOD NEUROLOGICAL BOWEL NUTRITION STATUS      Continent Diet (regular)  AMBULATORY STATUS COMMUNICATION OF NEEDS Skin   Limited Assist Verbally Normal                       Personal Care Assistance Level of Assistance  Bathing, Feeding, Dressing Bathing Assistance: Limited assistance Feeding assistance: Independent Dressing Assistance: Limited assistance     Functional Limitations Info  Speech, Hearing, Sight Sight Info: Impaired Hearing Info: Adequate Speech Info: Adequate    SPECIAL CARE FACTORS FREQUENCY  PT (By licensed PT), OT (By licensed OT)     PT Frequency: 5x/wk OT Frequency: 5x/wk            Contractures Contractures Info: Not present    Additional Factors Info  Allergies, Code Status Code Status Info: full code Allergies Info: No Known Allergies           Current Medications (06/12/2023):  This is the current hospital active medication list Current Facility-Administered Medications  Medication Dose Route Frequency Provider Last Rate Last Admin   apixaban  (ELIQUIS ) tablet 5 mg  5 mg Oral BID Alva, Rakesh V, MD   5 mg at 06/12/23 1109   arformoterol  (BROVANA ) nebulizer solution 15 mcg  15 mcg Nebulization BID Alva, Rakesh V, MD   15 mcg at 06/12/23 0745   aspirin  EC tablet 81 mg  81 mg  Oral Daily Gleason, Laura R, PA-C   81 mg at 06/12/23 1109   azithromycin  (ZITHROMAX ) 500 mg in sodium chloride  0.9 % 250 mL IVPB  500 mg Intravenous Q24H Claven Cumming, MD   Stopped at 06/12/23 [phone]   budesonide  (PULMICORT ) nebulizer solution 0.5 mg  0.5 mg Nebulization BID Alva, Rakesh V, MD   0.5 mg at 06/12/23 0745   cefTRIAXone  (ROCEPHIN ) 1 g in sodium chloride  0.9 % 100 mL IVPB  1 g Intravenous Q24H Gleason, Laura R, PA-C 200 mL/hr at 06/12/23 0838 1 g  at 06/12/23 2130   Chlorhexidine  Gluconate Cloth 2 % PADS 6 each  6 each Topical Daily Lind Repine, MD   6 each at 06/10/23 1108   famotidine  (PEPCID ) tablet 20 mg  20 mg Oral BID Alva, Rakesh V, MD   20 mg at 06/12/23 1109   feeding supplement (ENSURE ENLIVE / ENSURE PLUS) liquid 237 mL  237 mL Oral BID BM Alva, Rakesh V, MD       gabapentin  (NEURONTIN ) capsule 400 mg  400 mg Oral Daily Shalhoub, George J, MD       insulin  aspart (novoLOG ) injection 0-15 Units  0-15 Units Subcutaneous TID AC & HS Gleason, Laura R, PA-C   5 Units at 06/12/23 1152   levalbuterol  (XOPENEX ) nebulizer solution 0.63 mg  0.63 mg Nebulization Q6H PRN Lind Repine, MD       [START ON 06/13/2023] linaclotide  (LINZESS ) capsule 145 mcg  145 mcg Oral QAC breakfast Shalhoub, Merrill Abide, MD       methylPREDNISolone  sodium succinate (SOLU-MEDROL ) 125 mg/2 mL injection 60 mg  60 mg Intravenous Q6H Claven Cumming, MD   60 mg at 06/12/23 8657   metoprolol  succinate (TOPROL -XL) 24 hr tablet 25 mg  25 mg Oral QHS Shalhoub, George J, MD       metoprolol  tartrate (LOPRESSOR ) injection 5 mg  5 mg Intravenous TID PRN Gleason, Laura R, PA-C       ondansetron  (ZOFRAN ) injection 4 mg  4 mg Intravenous Q6H PRN Claven Cumming, MD       Oral care mouth rinse  15 mL Mouth Rinse PRN Lind Repine, MD       pantoprazole  (PROTONIX ) EC tablet 40 mg  40 mg Oral Daily Shalhoub, George J, MD       polyethylene glycol (MIRALAX  / GLYCOLAX ) packet 17 g  17 g Oral Daily PRN Shalhoub, George J, MD       revefenacin  (YUPELRI ) nebulizer solution 175 mcg  175 mcg Nebulization Daily Alva, Rakesh V, MD   175 mcg at 06/12/23 0745   traZODone  (DESYREL ) tablet 50 mg  50 mg Oral Daily Shalhoub, George J, MD         Discharge Medications: Please see discharge summary for a list of discharge medications.  Relevant Imaging Results:  Relevant Lab Results:   Additional Information SSN [id number]  Amaryllis Junior, LCSW

## 2023-06-12 NOTE — Assessment & Plan Note (Signed)
?   Counseling on cessation daily ?

## 2023-06-13 DIAGNOSIS — J441 Chronic obstructive pulmonary disease with (acute) exacerbation: Secondary | ICD-10-CM | POA: Diagnosis not present

## 2023-06-13 DIAGNOSIS — J9621 Acute and chronic respiratory failure with hypoxia: Secondary | ICD-10-CM | POA: Diagnosis not present

## 2023-06-13 DIAGNOSIS — I10 Essential (primary) hypertension: Secondary | ICD-10-CM | POA: Diagnosis not present

## 2023-06-13 DIAGNOSIS — I5042 Chronic combined systolic (congestive) and diastolic (congestive) heart failure: Secondary | ICD-10-CM | POA: Diagnosis not present

## 2023-06-13 LAB — GLUCOSE, CAPILLARY
Glucose-Capillary: 174 mg/dL — ABNORMAL HIGH (ref 70–99)
Glucose-Capillary: 261 mg/dL — ABNORMAL HIGH (ref 70–99)
Glucose-Capillary: 180 mg/dL — ABNORMAL HIGH (ref 70–99)
Glucose-Capillary: 233 mg/dL — ABNORMAL HIGH (ref 70–99)

## 2023-06-13 LAB — BASIC METABOLIC PANEL WITH GFR
Anion gap: 10 (ref 5–15)
BUN: 36 mg/dL — ABNORMAL HIGH (ref 8–23)
CO2: 39 mmol/L — ABNORMAL HIGH (ref 22–32)
Calcium: 8.6 mg/dL — ABNORMAL LOW (ref 8.9–10.3)
Chloride: 91 mmol/L — ABNORMAL LOW (ref 98–111)
Creatinine, Ser: 0.87 mg/dL (ref 0.61–1.24)
GFR, Estimated: >60 mL/min (ref >60)
Glucose, Bld: 281 mg/dL — ABNORMAL HIGH (ref 70–99)
Potassium: 4.6 mmol/L (ref 3.5–5.1)
Sodium: 140 mmol/L (ref 135–145)

## 2023-06-13 LAB — COMPREHENSIVE METABOLIC PANEL WITH GFR
ALT: 23 U/L (ref 0–44)
AST: 13 U/L — ABNORMAL LOW (ref 15–41)
Albumin: 2.9 g/dL — ABNORMAL LOW (ref 3.5–5.0)
Alkaline Phosphatase: 45 U/L (ref 38–126)
Anion gap: 7 (ref 5–15)
BUN: 35 mg/dL — ABNORMAL HIGH (ref 8–23)
CO2: 40 mmol/L — ABNORMAL HIGH (ref 22–32)
Calcium: 8.8 mg/dL — ABNORMAL LOW (ref 8.9–10.3)
Chloride: 93 mmol/L — ABNORMAL LOW (ref 98–111)
Creatinine, Ser: 0.84 mg/dL (ref 0.61–1.24)
GFR, Estimated: >60 mL/min (ref >60)
Glucose, Bld: 145 mg/dL — ABNORMAL HIGH (ref 70–99)
Potassium: 5.2 mmol/L — ABNORMAL HIGH (ref 3.5–5.1)
Sodium: 140 mmol/L (ref 135–145)
Total Bilirubin: 0.5 mg/dL (ref 0.0–1.2)
Total Protein: 5.5 g/dL — ABNORMAL LOW (ref 6.5–8.1)

## 2023-06-13 LAB — CBC WITH DIFFERENTIAL/PLATELET
Abs Immature Granulocytes: 0.04 10*3/uL (ref 0.00–0.07)
Basophils Absolute: 0 10*3/uL (ref 0.0–0.1)
Basophils Relative: 0 %
Eosinophils Absolute: 0 10*3/uL (ref 0.0–0.5)
Eosinophils Relative: 0 %
HCT: 40 % (ref 39.0–52.0)
Hemoglobin: 12 g/dL — ABNORMAL LOW (ref 13.0–17.0)
Immature Granulocytes: 0 %
Lymphocytes Relative: 2 %
Lymphs Abs: 0.2 10*3/uL — ABNORMAL LOW (ref 0.7–4.0)
MCH: 31.8 pg (ref 26.0–34.0)
MCHC: 30 g/dL (ref 30.0–36.0)
MCV: 106.1 fL — ABNORMAL HIGH (ref 80.0–100.0)
Monocytes Absolute: 0.5 10*3/uL (ref 0.1–1.0)
Monocytes Relative: 5 %
Neutro Abs: 8.6 10*3/uL — ABNORMAL HIGH (ref 1.7–7.7)
Neutrophils Relative %: 93 %
Platelets: 202 10*3/uL (ref 150–400)
RBC: 3.77 MIL/uL — ABNORMAL LOW (ref 4.22–5.81)
RDW: 15.9 % — ABNORMAL HIGH (ref 11.5–15.5)
WBC: 9.3 10*3/uL (ref 4.0–10.5)
nRBC: 0 % (ref 0.0–0.2)

## 2023-06-13 LAB — MAGNESIUM: Magnesium: 2.4 mg/dL (ref 1.7–2.4)

## 2023-06-13 LAB — PHOSPHORUS: Phosphorus: 3.1 mg/dL (ref 2.5–4.6)

## 2023-06-13 LAB — BRAIN NATRIURETIC PEPTIDE: B Natriuretic Peptide: 208.9 pg/mL — ABNORMAL HIGH (ref 0.0–100.0)

## 2023-06-13 MED ORDER — METOPROLOL TARTRATE 12.5 MG HALF TABLET
12.5000 mg | ORAL_TABLET | Freq: Once | ORAL | Status: AC
  Administered 2023-06-13: 12.5 mg via ORAL
  Filled 2023-06-13: qty 1

## 2023-06-13 MED ORDER — METOPROLOL TARTRATE 25 MG PO TABS
37.5000 mg | ORAL_TABLET | Freq: Two times a day (BID) | ORAL | Status: DC
  Administered 2023-06-13 – 2023-06-14 (×2): 37.5 mg via ORAL
  Filled 2023-06-13 (×2): qty 1

## 2023-06-13 MED ORDER — TRAZODONE HCL 50 MG PO TABS
50.0000 mg | ORAL_TABLET | Freq: Every day | ORAL | Status: DC
  Administered 2023-06-13: 50 mg via ORAL
  Filled 2023-06-13: qty 1

## 2023-06-13 MED ORDER — FUROSEMIDE 10 MG/ML IJ SOLN
20.0000 mg | Freq: Once | INTRAMUSCULAR | Status: AC
  Administered 2023-06-13: 20 mg via INTRAVENOUS
  Filled 2023-06-13: qty 2

## 2023-06-13 MED ORDER — LEVALBUTEROL HCL 0.63 MG/3ML IN NEBU
0.6300 mg | INHALATION_SOLUTION | Freq: Three times a day (TID) | RESPIRATORY_TRACT | Status: DC
  Administered 2023-06-13 – 2023-06-14 (×5): 0.63 mg via RESPIRATORY_TRACT
  Filled 2023-06-13 (×5): qty 3

## 2023-06-13 MED ORDER — PREDNISONE 50 MG PO TABS
50.0000 mg | ORAL_TABLET | Freq: Every day | ORAL | Status: DC
  Administered 2023-06-14: 50 mg via ORAL
  Filled 2023-06-13: qty 1

## 2023-06-13 NOTE — Assessment & Plan Note (Signed)
 Rapid atrial fibrillation with heart rates in excess of 120 bpm Writing patient with intermittent doses of intravenous metoprolol  resuming home regimen of oral metoprolol  Monitoring patient on telemetry Continuing Eliquis 

## 2023-06-13 NOTE — Assessment & Plan Note (Signed)
 Patient experiencing significant generalized weakness due to repeated hospitalizations Physical evaluation by physical therapy today revealing patient has a skilled need for physical therapy in a skilled nursing facility

## 2023-06-13 NOTE — Progress Notes (Signed)
 PROGRESS NOTE   Johnny Rodriguez  FAO:[id number] DOB: Jan 31, 1951 DOA: 06/10/2023 PCP: Pcp, No   Date of Service: the patient was seen and examined on 06/13/2023  Brief Narrative:  73 y.o. male with medical history significant of COPD, chronic hypoxic respiratory failure on 3 L oxygen  at baseline, paroxysmal atrial fibrillation and pulmonary embolism (03/2022) on Eliquis , systolic and diastolic congestive heart failure with EF 35 to 40%, essential hypertension and chronic pain syndrome presented to emergency department on 4/30 complaining of shortness of breath.    Patient was found respiratory distress thought to be secondary to COPD exacerbation.  Patient was briefly placed on BiPAP but failed quickly and was intubated in ED. patient was initially admitted to the [facility] service.    Patient was managed with steroids, bronchodilator therapy, Pulmicort  and Brovana .  Ceftriaxone  was added due to gram-negative rods found in sputum culture.  Patient was extubated successfully morning of 5/1.  Patient was accepted to the hospitalist service beginning 5/2.   Assessment & Plan Acute on chronic respiratory failure with hypoxia and hypercapnia (HCC) Readmitted after recent discharge from hospital service on 4/26 for similar presentation. Doing well status post extubation on 5/2. Intermittent BiPAP therapy for shortness of breath, lethargy and sleep Reducing Solu-Medrol  to 60 mg every 12 Xopenex  every 6 hours while awake Completing 5-day course of empiric antibiotics considering severity of illness Patient is on 3 L of oxygen  via nasal cannula at baseline COPD with acute exacerbation (HCC) Primarily patient seems to be suffering from COPD exacerbation with possible superimposed acute bacterial bronchitis or even early pneumonia On exam, no evidence of contributing congestive heart failure Remainder of assessment and plan as above Paroxysmal atrial fibrillation with rapid ventricular response  (HCC) Rapid atrial fibrillation with heart rates in excess of 120 bpm Writing patient with intermittent doses of intravenous metoprolol  resuming home regimen of oral metoprolol  Monitoring patient on telemetry Continuing Eliquis  Chronic combined systolic and diastolic congestive heart failure (HCC) Some notable peripheral edema on exam but otherwise no obvious evidence of cardiogenic volume overload Strict input and output monitoring Daily weights Low-sodium diet Essential hypertension Continue metoprolol  As needed intravenous antihypertensives for markedly elevated blood pressure. Nicotine  dependence, cigarettes, uncomplicated Counseling on cessation daily Generalized weakness Patient experiencing significant generalized weakness due to repeated hospitalizations Physical evaluation by physical therapy today revealing patient has a skilled need for physical therapy in a skilled nursing facility Protein-calorie malnutrition, moderate (HCC) Nutrition consulted, their assistance is appreciated Encouraging oral intake Patient intermittently refusing nutritional supplements Acute hypoxic on chronic hypercapnic respiratory failure (HCC)      Subjective:  Patient states that his shortness of breath is improving.  Patient complaining of associated generalized weakness.  Physical Exam:  Vitals:   06/13/23 0700 06/13/23 0724 06/13/23 0729 06/13/23 0730  BP: 130/83 130/83    Pulse: 90 90    Resp: 20 17    Temp:      TempSrc:      SpO2: 97% 96% 99% 98%  Weight:      Height:        Constitutional: Awake alert and oriented x3, no associated distress.   Skin: no rashes, no lesions, good skin turgor noted. Eyes: Pupils are equally reactive to light.  No evidence of scleral icterus or conjunctival pallor.  ENMT: Moist mucous membranes noted.  Posterior pharynx clear of any exudate or lesions.   Respiratory: Diminished breath sounds, minimal wheezing, no rales.  Normal respiratory  effort. No accessory muscle use.  Cardiovascular: Tachycardiic rate with irregularly irregular rhythm no murmurs / rubs / gallops. No extremity edema. 2+ pedal pulses. No carotid bruits.  Abdomen: Abdomen is soft and nontender.  No evidence of intra-abdominal masses.  Positive bowel sounds noted in all quadrants.   Musculoskeletal: No joint deformity upper and lower extremities. Good ROM, no contractures. Normal muscle tone.    Data Reviewed:  I have personally reviewed and interpreted labs, imaging.  Significant findings are   CBC: Recent Labs  Lab 06/10/23 0117 06/10/23 0332 06/11/23 0827 06/12/23 0259 06/13/23 0304  WBC 12.1* 11.3* 8.4 7.6 9.3  NEUTROABS 9.9*  --   --   --  8.6*  HGB 14.3 12.9* 13.0 12.6* 12.0*  HCT 48.0 43.7 42.7 43.3 40.0  MCV 106.7* 107.4* 103.6* 108.8* 106.1*  PLT 224 169 198 176 202   Basic Metabolic Panel: Recent Labs  Lab 06/10/23 0332 06/10/23 1721 06/11/23 0827 06/12/23 0259 06/12/23 1207 06/13/23 0304  NA 140 140 138 139  --  140  K 5.5* 3.9 3.7 4.6  --  5.2*  CL 92* 97* 93* 97*  --  93*  CO2 35* 33* 33* 31  --  40*  GLUCOSE 163* 163* 150* 225*  --  145*  BUN 44* 38* 37* 34*  --  35*  CREATININE 0.71 0.72 0.70 0.84  --  0.84  CALCIUM  8.4* 8.6* 8.5* 8.3*  --  8.8*  MG 2.5* 2.4  --   --  2.3 2.4  PHOS 3.5  --   --   --   --  3.1   GFR: Estimated Creatinine Clearance: 85.9 mL/min (by C-G formula based on SCr of 0.84 mg/dL). Liver Function Tests: Recent Labs  Lab 06/10/23 0117 06/13/23 0304  AST 25 13*  ALT 45* 23  ALKPHOS 63 45  BILITOT 1.2 0.5  PROT 6.9 5.5*  ALBUMIN  3.9 2.9*    Coagulation Profile: No results for input(s): "INR", "PROTIME" in the last 168 hours.  Telemetry: Personally reviewed.  Rhythm is atrial fibrillation with heart rate of 120 bpm.  No dynamic ST segment changes appreciated.   Code Status:  Full code.  Code status decision has been confirmed with: patient    Severity of Illness:  The appropriate  patient status for this patient is INPATIENT. Inpatient status is judged to be reasonable and necessary in order to provide the required intensity of service to ensure the patient's safety. The patient's presenting symptoms, physical exam findings, and initial radiographic and laboratory data in the context of their chronic comorbidities is felt to place them at high risk for further clinical deterioration. Furthermore, it is not anticipated that the patient will be medically stable for discharge from the hospital within 2 midnights of admission.   * I certify that at the point of admission it is my clinical judgment that the patient will require inpatient hospital care spanning beyond 2 midnights from the point of admission due to high intensity of service, high risk for further deterioration and high frequency of surveillance required.*  Time spent:  54 minutes  Author:  True Fuss MD  06/13/2023 8:12 AM

## 2023-06-13 NOTE — Assessment & Plan Note (Signed)
 Primarily patient seems to be suffering from COPD exacerbation with possible superimposed acute bacterial bronchitis or even early pneumonia On exam, no evidence of contributing congestive heart failure Remainder of assessment and plan as above

## 2023-06-13 NOTE — Assessment & Plan Note (Signed)
 Some notable peripheral edema on exam but otherwise no obvious evidence of cardiogenic volume overload Strict input and output monitoring Daily weights Low-sodium diet

## 2023-06-13 NOTE — Plan of Care (Signed)

## 2023-06-13 NOTE — Assessment & Plan Note (Signed)
 Continue metoprolol As needed intravenous antihypertensives for markedly elevated blood pressure.

## 2023-06-13 NOTE — TOC Progression Note (Signed)
 Transition of Care [facility]) - Progression Note    Patient Details  Name: Johnny Rodriguez MRN: [id number] Date of Birth: Jan 04, 1951  [facility] CM/SW Contact  Levie Ream, RN Phone Number: 06/13/2023, 5:24 PM  Clinical Narrative:    Pt awaiting medical stability;a pt also having HR 100s; waiting ST eval; TOC is following.     Barriers to Discharge: Continued Medical Work up  Expected Discharge Plan and Services       Living arrangements for the past 2 months: Apartment                 DME Arranged: N/A DME Agency: NA       HH Arranged: NIV HH Agency: NA         Social Determinants of Health (SDOH) Interventions SDOH Screenings   Food Insecurity: Patient Unable To Answer (06/10/2023)  Housing: Patient Unable To Answer (06/10/2023)  Transportation Needs: Patient Unable To Answer (06/10/2023)  Recent Concern: Transportation Needs - Unmet Transportation Needs (05/18/2023)  Utilities: Patient Unable To Answer (06/10/2023)  Alcohol Screen: Low Risk  (09/11/2020)  Recent Concern: Alcohol Screen - Medium Risk (08/07/2020)  Depression (PHQ2-9): Low Risk  (11/21/2022)  Financial Resource Strain: Low Risk  (11/21/2022)  Physical Activity: Inactive (11/21/2022)  Social Connections: Patient Unable To Answer (06/10/2023)  Recent Concern: Social Connections - Socially Isolated (05/18/2023)  Stress: No Stress Concern Present (11/21/2022)  Tobacco Use: High Risk (06/10/2023)  Health Literacy: Adequate Health Literacy (11/21/2022)    Readmission Risk Interventions    06/10/2023   12:46 PM  Readmission Risk Prevention Plan  Transportation Screening Complete  PCP or Specialist Appt within 3-5 Days Complete  HRI or Home Care Consult Complete  Social Work Consult for Recovery Care Planning/Counseling Complete  Palliative Care Screening Not Applicable  Medication Review Oceanographer) Complete

## 2023-06-13 NOTE — Assessment & Plan Note (Signed)
 Readmitted after recent discharge from hospital service on 4/26 for similar presentation. Doing well status post extubation on 5/2. Intermittent BiPAP therapy for shortness of breath, lethargy and sleep Reducing Solu-Medrol  to 60 mg every 12 Xopenex  every 6 hours while awake Completing 5-day course of empiric antibiotics considering severity of illness Patient is on 3 L of oxygen  via nasal cannula at baseline

## 2023-06-13 NOTE — Evaluation (Signed)
 Clinical/Bedside Swallow Evaluation Patient Details  Name: Johnny Rodriguez MRN: [id number] Date of Birth: [birth year]  Today's Date: 06/13/2023 Time: SLP Start Time (ACUTE ONLY): 1735 SLP Stop Time (ACUTE ONLY): 1750 SLP Time Calculation (min) (ACUTE ONLY): 15 min  Past Medical History:  Past Medical History:  Diagnosis Date   AF (atrial fibrillation) (HCC)    Alcohol abuse    Angina pectoris    CAD (coronary artery disease) 12/15/2004   COPD (chronic obstructive pulmonary disease) (HCC)    Coronary artery disease    Heavy cigarette smoker    HTN (hypertension)    Idiopathic cardiomyopathy (HCC) 07/16/2009   Non compliance with medical treatment    Pulmonary embolism (HCC) 03/27/2022   Seizures (HCC)    Past Surgical History:  Past Surgical History:  Procedure Laterality Date   CARDIAC CATHETERIZATION  2006   CARDIAC CATHETERIZATION  12/2004   Left    CARDIOVERSION  01/21/2011   Procedure: CARDIOVERSION;  Surgeon: Elaina Graver., MD;  Location: [facility] OR;  Service: Cardiovascular;  Laterality: N/A;   DOPPLER ECHOCARDIOGRAPHY  02/2011   ELECTROPHYSIOLOGY STUDY N/A 12/16/2011   Procedure: ELECTROPHYSIOLOGY STUDY;  Surgeon: Tammie Fall, MD;  Location: [facility] CATH LAB;  Service: Cardiovascular;  Laterality: N/A;   EP IMPLANTABLE DEVICE N/A 08/24/2015   Procedure: Loop Recorder Removal;  Surgeon: Tammie Fall, MD;  Location: [facility] INVASIVE CV LAB;  Service: Cardiovascular;  Laterality: N/A;   fx collar bone     NO PAST SURGERIES     HPI:  Patient is a 73 y.o. 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 He was recently discharged on 06/06/23 with the same COPD exacerbation. He presented to the hospital on 06/10/23 with c/o SOB. He was briefly placed on BiPAP but failed quickly and was intubated. He was  extubated on 5/1.    Assessment / Plan / Recommendation  Clinical Impression  Patient is not currently presenting with clinical s/s of dysphagia as per this bedside swallow evaluation. He does have a h/o GERD and per GI  notes from 2024, plan was for barium esophagram to evaluated esophagus. Patient indicated that this test had been completed but SLP did not find completed test, only request to reschedule it. Per patient's description, he likely has a primary esophageal dysphagia. He describes feeling like food is coming back up in his esophagus "but it doesn't come up", endorses some instances of coughing with PO intake. SLP did not observe any overt s/s aspiration with thin liquids, regular solids, puree solids. Patient did not have any c/o of symptoms while he ate his meal tray for dinner. SLP is not recommending further intervention but recommend f/u with GI for management of esophageal dysphagia. SLP Visit Diagnosis: Dysphagia, unspecified (R13.10)    Aspiration Risk  No limitations    Diet Recommendation Regular;Thin liquid    Liquid Administration via: Cup;Straw Medication Administration: Whole meds with liquid Supervision: Patient able to self feed Compensations: Slow rate Postural Changes: Seated upright at 90 degrees;Remain upright for at least 30 minutes after po intake    Other  Recommendations Oral Care Recommendations: Oral care BID;Patient independent with oral care    Recommendations for follow up therapy are one component of a multi-disciplinary discharge planning process, led by the attending physician.  Recommendations may be updated based on patient status, additional  functional criteria and insurance authorization.  Follow up Recommendations No SLP follow up      Assistance Recommended at Discharge    Functional Status Assessment Patient has not had a recent decline in their functional status  Frequency and Duration   N/A         Prognosis   N/A     Swallow  Study   General Date of Onset: 06/13/23 HPI: Patient is a 73 y.o. 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 He was recently discharged on 06/06/23 with the same COPD exacerbation. He presented to the hospital on 06/10/23 with c/o SOB. He was briefly placed on BiPAP but failed quickly and was intubated. He was extubated on 5/1. Type of Study: Bedside Swallow Evaluation Previous Swallow Assessment: none found Diet Prior to this Study: Regular;Thin liquids (Level 0) Temperature Spikes Noted: No Respiratory Status: Nasal cannula History of Recent Intubation: Yes Total duration of intubation (days): 2 days Date extubated: 06/11/23 Behavior/Cognition: Alert;Cooperative;Pleasant mood Oral Cavity Assessment: Within Functional Limits Oral Care Completed by SLP: No Oral Cavity - Dentition: Missing dentition;Edentulous Vision: Functional for self-feeding Self-Feeding Abilities: Able to feed self Patient Positioning: Upright in bed Baseline Vocal Quality: Normal Volitional Cough: Strong Volitional Swallow: Able to elicit    Oral/Motor/Sensory Function Overall Oral Motor/Sensory Function: Within functional limits   Ice Chips     Thin Liquid Thin Liquid: Within functional limits Presentation: Straw;Self Fed    Nectar Thick     Honey Thick     Puree Puree: Within functional limits Presentation: Self Fed   Solid     Solid: Within functional limits Presentation: Self Fed     Jacqualine Mater, MA, CCC-SLP Speech Therapy

## 2023-06-13 NOTE — Progress Notes (Signed)
 Physical Therapy Treatment Patient Details Name: Johnny Rodriguez MRN: [id number] DOB: 09/17/1950 Today's Date: 06/13/2023   History of Present Illness 73 y.o. male presented to emergency department 06/10/23 complaining of shortness of breath. He was briefly placed on BiPAP but failed quickly and was intubated in ED Patient was extubated 06/11/23. 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    PT Comments  The patient is ready to ambulate. Patient ambulated x 50' with RW, HR110-150, SPo2 not detected during ambulation, 94 % after  replaced sensor, (~ 5 " after completed amb.) Patient maintained on 3 LPM.  Patient progressing.  Patient will benefit from continued inpatient follow up therapy, <3 hours/day  at this time.    If plan is discharge home, recommend the following: Two people to help with walking and/or transfers;A little help with bathing/dressing/bathroom;Assistance with cooking/housework;Help with stairs or ramp for entrance;Assist for transportation   Can travel by private vehicle        Equipment Recommendations  None recommended by PT    Recommendations for Other Services       Precautions / Restrictions Precautions Precautions: Fall Recall of Precautions/Restrictions: Intact Precaution/Restrictions Comments: watch sats and  HR Restrictions Weight Bearing Restrictions Per Provider Order: No     Mobility  Bed Mobility   Bed Mobility: Supine to Sit     Supine to sit: Min assist     General bed mobility comments: min asssit for trunk to pull upright    Transfers Overall transfer level: Needs assistance Equipment used: Rolling walker (2 wheels) Transfers: Sit to/from Stand Sit to Stand: Contact guard assist           General  transfer comment: patient steady to stand from bed    Ambulation/Gait Ambulation/Gait assistance: Min assist, +2 safety/equipment Gait Distance (Feet): 50 Feet Assistive device: Rolling walker (2 wheels) Gait Pattern/deviations: Step-through pattern, Decreased stride length Gait velocity: decreased     General Gait Details: slow gait speed, HR 110-150, RN aware. SPO2 not picking up   during amb, 94% when stopped  and seated. On 3LPM, dyspnea 3/4   Stairs             Wheelchair Mobility     Tilt Bed    Modified Rankin (Stroke Patients Only)       Balance Overall balance assessment: Needs assistance Sitting-balance support: Feet supported, Bilateral upper extremity supported Sitting balance-Leahy Scale: Fair     Standing balance support: Reliant on assistive device for balance, Bilateral upper extremity supported, During functional activity Standing balance-Leahy Scale: Fair                              Hotel manager: No apparent difficulties  Cognition Arousal: Alert Behavior During Therapy: WFL for tasks assessed/performed   PT - Cognitive impairments: Awareness                       PT - Cognition Comments: required cues for safe safety with RW Following commands: Intact      Cueing Cueing Techniques: Verbal cues  Exercises      General Comments        Pertinent Vitals/Pain Pain Assessment Pain Assessment: No/denies pain    Home Living  Prior Function            PT Goals (current goals can now be found in the care plan section) Progress towards PT goals: Progressing toward goals    Frequency    Min 2X/week      PT Plan      Co-evaluation              AM-PAC PT "6 Clicks" Mobility   Outcome Measure  Help needed turning from your back to your side while in a flat bed without using bedrails?: None Help needed moving from lying on your back  to sitting on the side of a flat bed without using bedrails?: A Little Help needed moving to and from a bed to a chair (including a wheelchair)?: A Little Help needed standing up from a chair using your arms (e.g., wheelchair or bedside chair)?: A Little Help needed to walk in hospital room?: A Lot Help needed climbing 3-5 steps with a railing? : Total 6 Click Score: 16    End of Session Equipment Utilized During Treatment: Gait belt;Oxygen  Activity Tolerance: Patient tolerated treatment well Patient left: in chair;with call bell/phone within reach;with chair alarm set Nurse Communication: Mobility status PT Visit Diagnosis: Muscle weakness (generalized) (M62.81);Other abnormalities of gait and mobility (R26.89);Unsteadiness on feet (R26.81);Difficulty in walking, not elsewhere classified (R26.2)     Time: 1610-9604 PT Time Calculation (min) (ACUTE ONLY): 33 min  Charges:    $Gait Training: 23-37 mins PT General Charges $$ ACUTE PT VISIT: 1 Visit                     Abelina Hoes PT Acute Rehabilitation Services Office [phone]     Dareen Ebbing 06/13/2023, 9:28 AM

## 2023-06-13 NOTE — Assessment & Plan Note (Signed)
?   Counseling on cessation daily ?

## 2023-06-13 NOTE — Progress Notes (Incomplete)
 PROGRESS NOTE   Johnny Rodriguez  LKG:[id number] DOB: 1950-09-26 DOA: 06/10/2023 PCP: Pcp, No   Date of Service: the patient was seen and examined on 06/13/2023  Brief Narrative:  73 y.o. male with medical history significant of COPD, chronic hypoxic respiratory failure on 3 L oxygen  at baseline, paroxysmal atrial fibrillation and pulmonary embolism (03/2022) on Eliquis , systolic and diastolic congestive heart failure with EF 35 to 40%, essential hypertension and chronic pain syndrome presented to emergency department on 4/30 complaining of shortness of breath.    Patient was found respiratory distress thought to be secondary to COPD exacerbation.  Patient was briefly placed on BiPAP but failed quickly and was intubated in ED. patient was initially admitted to the [facility] service.    Patient was managed with steroids, bronchodilator therapy, Pulmicort  and Brovana .  Ceftriaxone  was added due to gram-negative rods found in sputum culture.  Patient was extubated successfully morning of 5/1.  Patient was accepted to the hospitalist service beginning 5/2.   Assessment & Plan Acute on chronic respiratory failure with hypoxia and hypercapnia (HCC) Readmitted after recent discharge from hospital service on 4/26 for similar presentation. Doing well status post extubation on 5/2. Intermittent BiPAP therapy for shortness of breath, lethargy and sleep We will transition off of Solu-Medrol  to prednisone . Xopenex  every 6 hours while awake Completing 5-day course of empiric antibiotics considering severity of illness Patient is on 3 L of oxygen  via nasal cannula at baseline COPD with acute exacerbation (HCC) Primarily patient seems to be suffering from COPD exacerbation with possible superimposed acute bacterial bronchitis or even early pneumonia On exam, no evidence of contributing congestive heart failure Remainder of assessment and plan as above Paroxysmal atrial fibrillation with rapid ventricular  response (HCC) Rapid atrial fibrillation with heart rates in excess of 120 bpm Writing patient with intermittent doses of intravenous metoprolol  resuming home regimen of oral metoprolol  Monitoring patient on telemetry Continuing Eliquis  Chronic combined systolic and diastolic congestive heart failure (HCC) Some notable peripheral edema on exam but otherwise no obvious evidence of cardiogenic volume overload Strict input and output monitoring Daily weights Low-sodium diet Essential hypertension Continue metoprolol  As needed intravenous antihypertensives for markedly elevated blood pressure. Nicotine  dependence, cigarettes, uncomplicated Counseling on cessation daily Generalized weakness Patient experiencing significant generalized weakness due to repeated hospitalizations Physical evaluation by physical therapy today revealing patient has a skilled need for physical therapy in a skilled nursing facility Protein-calorie malnutrition, moderate (HCC) Nutrition consulted, their assistance is appreciated Encouraging oral intake Patient intermittently refusing nutritional supplements Acute hypoxic on chronic hypercapnic respiratory failure (HCC)      Subjective:  Patient states that his shortness of breath is improving.  Patient complaining of associated generalized weakness.  Physical Exam:  Vitals:   06/13/23 0700 06/13/23 0724 06/13/23 0729 06/13/23 0730  BP: 130/83 130/83    Pulse: 90 90    Resp: 20 17    Temp:      TempSrc:      SpO2: 97% 96% 99% 98%  Weight:      Height:        Constitutional: Awake alert and oriented x3, no associated distress.   Skin: no rashes, no lesions, good skin turgor noted. Eyes: Pupils are equally reactive to light.  No evidence of scleral icterus or conjunctival pallor.  ENMT: Moist mucous membranes noted.  Posterior pharynx clear of any exudate or lesions.   Respiratory: Diminished breath sounds, minimal wheezing, no rales.  Normal  respiratory effort. No accessory muscle use.  Cardiovascular: Tachycardiic rate with irregularly irregular rhythm no murmurs / rubs / gallops. No extremity edema. 2+ pedal pulses. No carotid bruits.  Abdomen: Abdomen is soft and nontender.  No evidence of intra-abdominal masses.  Positive bowel sounds noted in all quadrants.   Musculoskeletal: No joint deformity upper and lower extremities. Good ROM, no contractures. Normal muscle tone.    Data Reviewed:  I have personally reviewed and interpreted labs, imaging.  Significant findings are   CBC: Recent Labs  Lab 06/10/23 0117 06/10/23 0332 06/11/23 0827 06/12/23 0259 06/13/23 0304  WBC 12.1* 11.3* 8.4 7.6 9.3  NEUTROABS 9.9*  --   --   --  8.6*  HGB 14.3 12.9* 13.0 12.6* 12.0*  HCT 48.0 43.7 42.7 43.3 40.0  MCV 106.7* 107.4* 103.6* 108.8* 106.1*  PLT 224 169 198 176 202   Basic Metabolic Panel: Recent Labs  Lab 06/10/23 0332 06/10/23 1721 06/11/23 0827 06/12/23 0259 06/12/23 1207 06/13/23 0304  NA 140 140 138 139  --  140  K 5.5* 3.9 3.7 4.6  --  5.2*  CL 92* 97* 93* 97*  --  93*  CO2 35* 33* 33* 31  --  40*  GLUCOSE 163* 163* 150* 225*  --  145*  BUN 44* 38* 37* 34*  --  35*  CREATININE 0.71 0.72 0.70 0.84  --  0.84  CALCIUM  8.4* 8.6* 8.5* 8.3*  --  8.8*  MG 2.5* 2.4  --   --  2.3 2.4  PHOS 3.5  --   --   --   --  3.1   GFR: Estimated Creatinine Clearance: 85.9 mL/min (by C-G formula based on SCr of 0.84 mg/dL). Liver Function Tests: Recent Labs  Lab 06/10/23 0117 06/13/23 0304  AST 25 13*  ALT 45* 23  ALKPHOS 63 45  BILITOT 1.2 0.5  PROT 6.9 5.5*  ALBUMIN  3.9 2.9*    Coagulation Profile: No results for input(s): "INR", "PROTIME" in the last 168 hours.  Telemetry: Personally reviewed.  Rhythm is atrial fibrillation with heart rate of 120 bpm.  No dynamic ST segment changes appreciated.   Code Status:  Full code.  Code status decision has been confirmed with: patient    Severity of Illness:  The  appropriate patient status for this patient is INPATIENT. Inpatient status is judged to be reasonable and necessary in order to provide the required intensity of service to ensure the patient's safety. The patient's presenting symptoms, physical exam findings, and initial radiographic and laboratory data in the context of their chronic comorbidities is felt to place them at high risk for further clinical deterioration. Furthermore, it is not anticipated that the patient will be medically stable for discharge from the hospital within 2 midnights of admission.   * I certify that at the point of admission it is my clinical judgment that the patient will require inpatient hospital care spanning beyond 2 midnights from the point of admission due to high intensity of service, high risk for further deterioration and high frequency of surveillance required.*  Time spent:  54 minutes  Author:  True Fuss MD  06/13/2023 8:12 AM

## 2023-06-13 NOTE — Assessment & Plan Note (Signed)
 Nutrition consulted, their assistance is appreciated Encouraging oral intake Patient intermittently refusing nutritional supplements

## 2023-06-14 DIAGNOSIS — E875 Hyperkalemia: Secondary | ICD-10-CM

## 2023-06-14 DIAGNOSIS — J9601 Acute respiratory failure with hypoxia: Secondary | ICD-10-CM

## 2023-06-14 DIAGNOSIS — J9612 Chronic respiratory failure with hypercapnia: Secondary | ICD-10-CM

## 2023-06-14 LAB — CBC WITH DIFFERENTIAL/PLATELET
Abs Immature Granulocytes: 0.09 10*3/uL — ABNORMAL HIGH (ref 0.00–0.07)
Basophils Absolute: 0 10*3/uL (ref 0.0–0.1)
Basophils Relative: 0 %
Eosinophils Absolute: 0 10*3/uL (ref 0.0–0.5)
Eosinophils Relative: 0 %
HCT: 39.1 % (ref 39.0–52.0)
Hemoglobin: 11.4 g/dL — ABNORMAL LOW (ref 13.0–17.0)
Immature Granulocytes: 1 %
Lymphocytes Relative: 1 %
Lymphs Abs: 0.1 10*3/uL — ABNORMAL LOW (ref 0.7–4.0)
MCH: 31.8 pg (ref 26.0–34.0)
MCHC: 29.2 g/dL — ABNORMAL LOW (ref 30.0–36.0)
MCV: 108.9 fL — ABNORMAL HIGH (ref 80.0–100.0)
Monocytes Absolute: 0.5 10*3/uL (ref 0.1–1.0)
Monocytes Relative: 4 %
Neutro Abs: 12.9 10*3/uL — ABNORMAL HIGH (ref 1.7–7.7)
Neutrophils Relative %: 94 %
Platelets: 192 10*3/uL (ref 150–400)
RBC: 3.59 MIL/uL — ABNORMAL LOW (ref 4.22–5.81)
RDW: 15.6 % — ABNORMAL HIGH (ref 11.5–15.5)
WBC: 13.6 10*3/uL — ABNORMAL HIGH (ref 4.0–10.5)
nRBC: 0 % (ref 0.0–0.2)

## 2023-06-14 LAB — BASIC METABOLIC PANEL WITH GFR
Anion gap: 4 — ABNORMAL LOW (ref 5–15)
BUN: 29 mg/dL — ABNORMAL HIGH (ref 8–23)
CO2: 43 mmol/L — ABNORMAL HIGH (ref 22–32)
Calcium: 8.4 mg/dL — ABNORMAL LOW (ref 8.9–10.3)
Chloride: 92 mmol/L — ABNORMAL LOW (ref 98–111)
Creatinine, Ser: 0.73 mg/dL (ref 0.61–1.24)
GFR, Estimated: >60 mL/min (ref >60)
Glucose, Bld: 248 mg/dL — ABNORMAL HIGH (ref 70–99)
Potassium: 4.9 mmol/L (ref 3.5–5.1)
Sodium: 139 mmol/L (ref 135–145)

## 2023-06-14 LAB — MAGNESIUM: Magnesium: 2.1 mg/dL (ref 1.7–2.4)

## 2023-06-14 LAB — PHOSPHORUS: Phosphorus: 2.4 mg/dL — ABNORMAL LOW (ref 2.5–4.6)

## 2023-06-14 LAB — GLUCOSE, CAPILLARY
Glucose-Capillary: 203 mg/dL — ABNORMAL HIGH (ref 70–99)
Glucose-Capillary: 157 mg/dL — ABNORMAL HIGH (ref 70–99)

## 2023-06-14 MED ORDER — CEFDINIR 300 MG PO CAPS: 300.0000 mg | ORAL_CAPSULE | Freq: Two times a day (BID) | ORAL | 0 refills | Status: DC

## 2023-06-14 MED ORDER — PREDNISONE 10 MG PO TABS: ORAL_TABLET | ORAL | 0 refills | Status: DC

## 2023-06-14 MED ORDER — K PHOS MONO-SOD PHOS DI & MONO 155-852-130 MG PO TABS
500.0000 mg | ORAL_TABLET | Freq: Once | ORAL | Status: AC
  Administered 2023-06-14: 500 mg via ORAL
  Filled 2023-06-14: qty 2

## 2023-06-14 MED ORDER — METOPROLOL TARTRATE 37.5 MG PO TABS: 37.5000 mg | ORAL_TABLET | Freq: Two times a day (BID) | ORAL | 2 refills | Status: DC

## 2023-06-14 NOTE — Progress Notes (Signed)
 Physical Therapy Treatment Patient Details Name: Johnny Rodriguez MRN: [id number] DOB: 1950-03-20 Today's Date: 06/14/2023   History of Present Illness 73 y.o. male presented to emergency department 06/10/23 complaining of shortness of breath. He was briefly placed on BiPAP but failed quickly and was intubated in ED Patient was extubated 06/11/23. 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    PT Comments  Pt agreeable to working with therapy. O2: 95% on 3L at rest, 88% on 3L with ambulation, dyspnea 3/4. Assisted pt back into recliner. Pt is eager to speak with CM/SW about "taxi voucher." Pt is progressing with mobility and activity tolerance. Will continue to follow pt acutely. Patient will benefit from continued inpatient follow up therapy, <3 hours/day     If plan is discharge home, recommend the following: A little help with bathing/dressing/bathroom;Assistance with cooking/housework;Help with stairs or ramp for entrance;Assist for transportation;A little help with walking and/or transfers   Can travel by private vehicle        Equipment Recommendations  None recommended by PT    Recommendations for Other Services       Precautions / Restrictions Precautions Precautions: Fall Precaution/Restrictions Comments: watch sats and  HR Restrictions Weight Bearing Restrictions Per Provider Order: No     Mobility  Bed Mobility               General bed mobility comments: oob in recliner    Transfers Overall transfer level: Needs assistance Equipment used: Rolling walker (2 wheels) Transfers: Sit to/from Stand Sit to Stand: Contact guard assist           General transfer comment: Cues for safety, hand placement.     Ambulation/Gait Ambulation/Gait assistance: Min assist Gait Distance (Feet): 75 Feet Assistive device: Rolling walker (2 wheels) Gait Pattern/deviations: Step-through pattern, Decreased stride length       General Gait Details: Unsteady. Fatigues fairly easily. O2 88% on 3L, dyspnea 3/4. Assist to stabilize pt throughout distance. 95% once seated back in recliner   Stairs             Wheelchair Mobility     Tilt Bed    Modified Rankin (Stroke Patients Only)       Balance Overall balance assessment: Needs assistance         Standing balance support: Bilateral upper extremity supported, During functional activity, Reliant on assistive device for balance Standing balance-Leahy Scale: Fair                              Hotel manager: No apparent difficulties  Cognition Arousal: Alert Behavior During Therapy: WFL for tasks assessed/performed   PT - Cognitive impairments: Awareness                         Following commands: Intact      Cueing Cueing Techniques: Verbal cues  Exercises      General Comments        Pertinent Vitals/Pain Pain Assessment Pain Assessment: No/denies pain    Home Living                          Prior Function  PT Goals (current goals can now be found in the care plan section) Progress towards PT goals: Progressing toward goals    Frequency    Min 2X/week      PT Plan      Co-evaluation              AM-PAC PT "6 Clicks" Mobility   Outcome Measure  Help needed turning from your back to your side while in a flat bed without using bedrails?: None Help needed moving from lying on your back to sitting on the side of a flat bed without using bedrails?: A Little Help needed moving to and from a bed to a chair (including a wheelchair)?: A Little Help needed standing up from a chair using your arms (e.g., wheelchair or bedside chair)?: A  Little Help needed to walk in hospital room?: A Little Help needed climbing 3-5 steps with a railing? : Total 6 Click Score: 17    End of Session Equipment Utilized During Treatment: Gait belt;Oxygen  Activity Tolerance: Patient tolerated treatment well Patient left: in chair;with call bell/phone within reach   PT Visit Diagnosis: Muscle weakness (generalized) (M62.81);Other abnormalities of gait and mobility (R26.89);Unsteadiness on feet (R26.81);Difficulty in walking, not elsewhere classified (R26.2)     Time: 4098-1191 PT Time Calculation (min) (ACUTE ONLY): 17 min  Charges:    $Gait Training: 8-22 mins PT General Charges $$ ACUTE PT VISIT: 1 Visit                        Tanda Falter, PT Acute Rehabilitation  Office: [phone]

## 2023-06-14 NOTE — TOC Transition Note (Signed)
 Transition of Care [facility]) - Discharge Note   Patient Details  Name: Johnny Rodriguez MRN: [id number] Date of Birth: [birth year]  Transition of Care [facility]) CM/SW Contact:  Levie Ream, RN Phone Number: 06/14/2023, 3:32 PM   Clinical Narrative:    Eldora Greet w/ pt in room; pt says he wants to go home; he also says he does not want SNF, and he does not want HH services at home; pt says he does not have travel tank, his oxygen  is provided by [employer]; pt also says has money to pay for taxi; notified Ada at agency; she says travel tank will be delivered to room; Dr Lanette Pipe notified [employer] secure chat; his bedside nurse will assist in arranging taxi; no TOC needs.   Final next level of care: Home/Self Care Barriers to Discharge: No Barriers Identified   Patient Goals and CMS Choice Patient states their goals for this hospitalization and ongoing recovery are:: return home CMS Medicare.gov Compare Post Acute Care list provided to::  (NA) Choice offered to / list presented to : NA [place] ownership interest in [facility].provided to::  (NA)    Discharge Placement                       Discharge Plan and Services Additional resources added to the After Visit Summary for                  DME Arranged: N/A DME Agency: NA       HH Arranged: NIV HH Agency: NA        Social Drivers of Health (SDOH) Interventions SDOH Screenings   Food Insecurity: Patient Unable To Answer (06/10/2023)  Housing: Patient Unable To Answer (06/10/2023)  Transportation Needs: Patient Unable To Answer (06/10/2023)  Recent Concern: Transportation Needs - Unmet Transportation Needs (05/18/2023)  Utilities: Patient Unable To Answer (06/10/2023)  Alcohol Screen: Low Risk  (09/11/2020)  Recent Concern: Alcohol Screen - Medium Risk (08/07/2020)  Depression (PHQ2-9): Low Risk  (11/21/2022)  Financial Resource Strain: Low Risk  (11/21/2022)  Physical Activity: Inactive (11/21/2022)  Social  Connections: Patient Unable To Answer (06/10/2023)  Recent Concern: Social Connections - Socially Isolated (05/18/2023)  Stress: No Stress Concern Present (11/21/2022)  Tobacco Use: High Risk (06/10/2023)  Health Literacy: Adequate Health Literacy (11/21/2022)     Readmission Risk Interventions    06/10/2023   12:46 PM  Readmission Risk Prevention Plan  Transportation Screening Complete  PCP or Specialist Appt within 3-5 Days Complete  HRI or Home Care Consult Complete  Social Work Consult for Recovery Care Planning/Counseling Complete  Palliative Care Screening Not Applicable  Medication Review [employer]) Complete

## 2023-06-14 NOTE — Discharge Instructions (Addendum)
 Please take all prescribed medications exactly as instructed.  These are all listed in detail in your after visit summary including the remaining course of your antibiotic therapy, your prednisone  taper and your increased regimen of metoprolol . Please continue to use your supplemental oxygen  at home with 3 L of oxygen  via nasal cannula at all times. A referral has been made for you to receive home health physical therapy as well as home nursing.  You should be contacted in the next 1 to 2 days to establish care. Please consume a low-sodium diet Please increase your physical activity as tolerated.  Only ambulate using an assistive device as a wheelchair or a walker or with assistance. Please maintain all outpatient follow-up appointments including follow-up with your primary care provider as well as pulmonology as detailed in your after visit summary. Please return to the emergency department if you develop worsening shortness of breath, fevers in excess of 100.4 F, weakness or inability to tolerate oral intake.

## 2023-06-14 NOTE — Assessment & Plan Note (Signed)
 Mild hyperkalemia Administering one-time dose of intravenous Lasix  Will reevaluate with repeat chemistry On telemetry

## 2023-06-14 NOTE — Plan of Care (Signed)
 No acute events overnight. Problem: Education: Goal: Ability to describe self-care measures that may prevent or decrease complications (Diabetes Survival Skills Education) will improve Outcome: Progressing Goal: Individualized Educational Video(s) Outcome: Progressing   Problem: Coping: Goal: Ability to adjust to condition or change in health will improve Outcome: Progressing   Problem: Fluid Volume: Goal: Ability to maintain a balanced intake and output will improve Outcome: Progressing   Problem: Health Behavior/Discharge Planning: Goal: Ability to identify and utilize available resources and services will improve Outcome: Progressing Goal: Ability to manage health-related needs will improve Outcome: Progressing   Problem: Metabolic: Goal: Ability to maintain appropriate glucose levels will improve Outcome: Progressing   Problem: Nutritional: Goal: Maintenance of adequate nutrition will improve Outcome: Progressing Goal: Progress toward achieving an optimal weight will improve Outcome: Progressing   Problem: Skin Integrity: Goal: Risk for impaired skin integrity will decrease Outcome: Progressing   Problem: Tissue Perfusion: Goal: Adequacy of tissue perfusion will improve Outcome: Progressing   Problem: Education: Goal: Knowledge of General Education information will improve Description: Including pain rating scale, medication(s)/side effects and non-pharmacologic comfort measures Outcome: Progressing   Problem: Health Behavior/Discharge Planning: Goal: Ability to manage health-related needs will improve Outcome: Progressing   Problem: Clinical Measurements: Goal: Ability to maintain clinical measurements within normal limits will improve Outcome: Progressing Goal: Will remain free from infection Outcome: Progressing Goal: Diagnostic test results will improve Outcome: Progressing Goal: Respiratory complications will improve Outcome: Progressing Goal:  Cardiovascular complication will be avoided Outcome: Progressing   Problem: Activity: Goal: Risk for activity intolerance will decrease Outcome: Progressing   Problem: Nutrition: Goal: Adequate nutrition will be maintained Outcome: Progressing   Problem: Coping: Goal: Level of anxiety will decrease Outcome: Progressing   Problem: Elimination: Goal: Will not experience complications related to bowel motility Outcome: Progressing Goal: Will not experience complications related to urinary retention Outcome: Progressing   Problem: Pain Managment: Goal: General experience of comfort will improve and/or be controlled Outcome: Progressing   Problem: Safety: Goal: Ability to remain free from injury will improve Outcome: Progressing   Problem: Skin Integrity: Goal: Risk for impaired skin integrity will decrease Outcome: Progressing

## 2023-06-15 ENCOUNTER — Inpatient Hospital Stay (HOSPITAL_COMMUNITY)

## 2023-06-15 ENCOUNTER — Encounter (HOSPITAL_COMMUNITY): Payer: Self-pay

## 2023-06-15 ENCOUNTER — Emergency Department (HOSPITAL_COMMUNITY)

## 2023-06-15 ENCOUNTER — Inpatient Hospital Stay (HOSPITAL_COMMUNITY)
Admission: EM | Admit: 2023-06-15 | Discharge: 2023-06-19 | DRG: 208 | Disposition: A | Discharge status: Home / Self Care | Attending: Pulmonary Disease | Admitting: Pulmonary Disease

## 2023-06-15 ENCOUNTER — Other Ambulatory Visit: Payer: Self-pay

## 2023-06-15 DIAGNOSIS — J9622 Acute and chronic respiratory failure with hypercapnia: Secondary | ICD-10-CM | POA: Diagnosis present

## 2023-06-15 DIAGNOSIS — E874 Mixed disorder of acid-base balance: Secondary | ICD-10-CM | POA: Diagnosis present

## 2023-06-15 DIAGNOSIS — J440 Chronic obstructive pulmonary disease with acute lower respiratory infection: Secondary | ICD-10-CM | POA: Diagnosis present

## 2023-06-15 DIAGNOSIS — E876 Hypokalemia: Secondary | ICD-10-CM | POA: Diagnosis not present

## 2023-06-15 DIAGNOSIS — I480 Paroxysmal atrial fibrillation: Secondary | ICD-10-CM

## 2023-06-15 DIAGNOSIS — Z5982 Transportation insecurity: Secondary | ICD-10-CM

## 2023-06-15 DIAGNOSIS — F17210 Nicotine dependence, cigarettes, uncomplicated: Secondary | ICD-10-CM | POA: Diagnosis present

## 2023-06-15 DIAGNOSIS — R6520 Severe sepsis without septic shock: Secondary | ICD-10-CM | POA: Diagnosis present

## 2023-06-15 DIAGNOSIS — I4821 Permanent atrial fibrillation: Secondary | ICD-10-CM | POA: Diagnosis present

## 2023-06-15 DIAGNOSIS — I429 Cardiomyopathy, unspecified: Secondary | ICD-10-CM | POA: Diagnosis present

## 2023-06-15 DIAGNOSIS — J9602 Acute respiratory failure with hypercapnia: Secondary | ICD-10-CM | POA: Diagnosis not present

## 2023-06-15 DIAGNOSIS — I110 Hypertensive heart disease with heart failure: Secondary | ICD-10-CM | POA: Diagnosis present

## 2023-06-15 DIAGNOSIS — G9341 Metabolic encephalopathy: Secondary | ICD-10-CM | POA: Diagnosis present

## 2023-06-15 DIAGNOSIS — Z86711 Personal history of pulmonary embolism: Secondary | ICD-10-CM

## 2023-06-15 DIAGNOSIS — I5082 Biventricular heart failure: Secondary | ICD-10-CM | POA: Diagnosis not present

## 2023-06-15 DIAGNOSIS — J441 Chronic obstructive pulmonary disease with (acute) exacerbation: Secondary | ICD-10-CM | POA: Diagnosis present

## 2023-06-15 DIAGNOSIS — A419 Sepsis, unspecified organism: Principal | ICD-10-CM | POA: Diagnosis present

## 2023-06-15 DIAGNOSIS — Z9981 Dependence on supplemental oxygen: Secondary | ICD-10-CM | POA: Diagnosis not present

## 2023-06-15 DIAGNOSIS — E1165 Type 2 diabetes mellitus with hyperglycemia: Secondary | ICD-10-CM | POA: Diagnosis present

## 2023-06-15 DIAGNOSIS — J9621 Acute and chronic respiratory failure with hypoxia: Secondary | ICD-10-CM | POA: Diagnosis not present

## 2023-06-15 DIAGNOSIS — I2510 Atherosclerotic heart disease of native coronary artery without angina pectoris: Secondary | ICD-10-CM | POA: Diagnosis present

## 2023-06-15 DIAGNOSIS — Z825 Family history of asthma and other chronic lower respiratory diseases: Secondary | ICD-10-CM

## 2023-06-15 DIAGNOSIS — Z8673 Personal history of transient ischemic attack (TIA), and cerebral infarction without residual deficits: Secondary | ICD-10-CM

## 2023-06-15 DIAGNOSIS — F1010 Alcohol abuse, uncomplicated: Secondary | ICD-10-CM | POA: Diagnosis present

## 2023-06-15 DIAGNOSIS — Z823 Family history of stroke: Secondary | ICD-10-CM

## 2023-06-15 DIAGNOSIS — I2699 Other pulmonary embolism without acute cor pulmonale: Secondary | ICD-10-CM | POA: Diagnosis not present

## 2023-06-15 DIAGNOSIS — Z791 Long term (current) use of non-steroidal anti-inflammatories (NSAID): Secondary | ICD-10-CM

## 2023-06-15 DIAGNOSIS — J690 Pneumonitis due to inhalation of food and vomit: Secondary | ICD-10-CM | POA: Diagnosis present

## 2023-06-15 DIAGNOSIS — I10 Essential (primary) hypertension: Secondary | ICD-10-CM | POA: Diagnosis not present

## 2023-06-15 DIAGNOSIS — Z7901 Long term (current) use of anticoagulants: Secondary | ICD-10-CM

## 2023-06-15 DIAGNOSIS — I5023 Acute on chronic systolic (congestive) heart failure: Secondary | ICD-10-CM | POA: Diagnosis present

## 2023-06-15 DIAGNOSIS — Z7982 Long term (current) use of aspirin: Secondary | ICD-10-CM

## 2023-06-15 DIAGNOSIS — I959 Hypotension, unspecified: Secondary | ICD-10-CM | POA: Diagnosis present

## 2023-06-15 DIAGNOSIS — B370 Candidal stomatitis: Secondary | ICD-10-CM | POA: Diagnosis not present

## 2023-06-15 DIAGNOSIS — E871 Hypo-osmolality and hyponatremia: Secondary | ICD-10-CM | POA: Diagnosis present

## 2023-06-15 DIAGNOSIS — J449 Chronic obstructive pulmonary disease, unspecified: Secondary | ICD-10-CM | POA: Diagnosis not present

## 2023-06-15 DIAGNOSIS — G894 Chronic pain syndrome: Secondary | ICD-10-CM | POA: Diagnosis present

## 2023-06-15 DIAGNOSIS — J9620 Acute and chronic respiratory failure, unspecified whether with hypoxia or hypercapnia: Secondary | ICD-10-CM | POA: Diagnosis present

## 2023-06-15 DIAGNOSIS — E869 Volume depletion, unspecified: Secondary | ICD-10-CM | POA: Diagnosis present

## 2023-06-15 DIAGNOSIS — Z91199 Patient's noncompliance with other medical treatment and regimen due to unspecified reason: Secondary | ICD-10-CM

## 2023-06-15 DIAGNOSIS — Z79899 Other long term (current) drug therapy: Secondary | ICD-10-CM

## 2023-06-15 DIAGNOSIS — J189 Pneumonia, unspecified organism: Secondary | ICD-10-CM | POA: Diagnosis present

## 2023-06-15 DIAGNOSIS — Z8249 Family history of ischemic heart disease and other diseases of the circulatory system: Secondary | ICD-10-CM | POA: Diagnosis not present

## 2023-06-15 DIAGNOSIS — I4891 Unspecified atrial fibrillation: Secondary | ICD-10-CM | POA: Diagnosis not present

## 2023-06-15 DIAGNOSIS — Z86718 Personal history of other venous thrombosis and embolism: Secondary | ICD-10-CM

## 2023-06-15 DIAGNOSIS — R739 Hyperglycemia, unspecified: Secondary | ICD-10-CM | POA: Diagnosis not present

## 2023-06-15 DIAGNOSIS — I5020 Unspecified systolic (congestive) heart failure: Secondary | ICD-10-CM | POA: Diagnosis not present

## 2023-06-15 LAB — GLUCOSE, CAPILLARY
Glucose-Capillary: 175 mg/dL — ABNORMAL HIGH (ref 70–99)
Glucose-Capillary: 201 mg/dL — ABNORMAL HIGH (ref 70–99)
Glucose-Capillary: 210 mg/dL — ABNORMAL HIGH (ref 70–99)
Glucose-Capillary: 211 mg/dL — ABNORMAL HIGH (ref 70–99)

## 2023-06-15 LAB — I-STAT VENOUS BLOOD GAS, ED
Acid-Base Excess: 14 mmol/L — ABNORMAL HIGH (ref 0.0–2.0)
Bicarbonate: 43.2 mmol/L — ABNORMAL HIGH (ref 20.0–28.0)
Calcium, Ion: 1 mmol/L — ABNORMAL LOW (ref 1.15–1.40)
HCT: 47 % (ref 39.0–52.0)
Hemoglobin: 16 g/dL (ref 13.0–17.0)
O2 Saturation: 55 %
Potassium: 4.8 mmol/L (ref 3.5–5.1)
Sodium: 134 mmol/L — ABNORMAL LOW (ref 135–145)
TCO2: 45 mmol/L — ABNORMAL HIGH (ref 22–32)
pCO2, Ven: 74 mmHg (ref 44–60)
pH, Ven: 7.374 (ref 7.25–7.43)
pO2, Ven: 32 mmHg (ref 32–45)

## 2023-06-15 LAB — POCT I-STAT 7, (LYTES, BLD GAS, ICA,H+H)
Acid-Base Excess: 16 mmol/L — ABNORMAL HIGH (ref 0.0–2.0)
Bicarbonate: 41.9 mmol/L — ABNORMAL HIGH (ref 20.0–28.0)
Calcium, Ion: 1.11 mmol/L — ABNORMAL LOW (ref 1.15–1.40)
HCT: 37 % — ABNORMAL LOW (ref 39.0–52.0)
Hemoglobin: 12.6 g/dL — ABNORMAL LOW (ref 13.0–17.0)
O2 Saturation: 99 %
Patient temperature: 98.1
Potassium: 4.6 mmol/L (ref 3.5–5.1)
Sodium: 135 mmol/L (ref 135–145)
TCO2: 44 mmol/L — ABNORMAL HIGH (ref 22–32)
pCO2 arterial: 55.3 mmHg — ABNORMAL HIGH (ref 32–48)
pH, Arterial: 7.486 — ABNORMAL HIGH (ref 7.35–7.45)
pO2, Arterial: 134 mmHg — ABNORMAL HIGH (ref 83–108)

## 2023-06-15 LAB — BASIC METABOLIC PANEL WITH GFR
Anion gap: 9 (ref 5–15)
BUN: 29 mg/dL — ABNORMAL HIGH (ref 8–23)
CO2: 38 mmol/L — ABNORMAL HIGH (ref 22–32)
Calcium: 8.6 mg/dL — ABNORMAL LOW (ref 8.9–10.3)
Chloride: 92 mmol/L — ABNORMAL LOW (ref 98–111)
Creatinine, Ser: 0.8 mg/dL (ref 0.61–1.24)
GFR, Estimated: >60 mL/min (ref >60)
Glucose, Bld: 169 mg/dL — ABNORMAL HIGH (ref 70–99)
Potassium: 4.9 mmol/L (ref 3.5–5.1)
Sodium: 139 mmol/L (ref 135–145)

## 2023-06-15 LAB — CBC WITH DIFFERENTIAL/PLATELET
Abs Immature Granulocytes: 0.25 10*3/uL — ABNORMAL HIGH (ref 0.00–0.07)
Basophils Absolute: 0.1 10*3/uL (ref 0.0–0.1)
Basophils Relative: 0 %
Eosinophils Absolute: 0 10*3/uL (ref 0.0–0.5)
Eosinophils Relative: 0 %
HCT: 50 % (ref 39.0–52.0)
Hemoglobin: 14.8 g/dL (ref 13.0–17.0)
Immature Granulocytes: 1 %
Lymphocytes Relative: 7 %
Lymphs Abs: 1.5 10*3/uL (ref 0.7–4.0)
MCH: 31.6 pg (ref 26.0–34.0)
MCHC: 29.6 g/dL — ABNORMAL LOW (ref 30.0–36.0)
MCV: 106.6 fL — ABNORMAL HIGH (ref 80.0–100.0)
Monocytes Absolute: 2.3 10*3/uL — ABNORMAL HIGH (ref 0.1–1.0)
Monocytes Relative: 10 %
Neutro Abs: 19 10*3/uL — ABNORMAL HIGH (ref 1.7–7.7)
Neutrophils Relative %: 82 %
Platelets: 264 10*3/uL (ref 150–400)
RBC: 4.69 MIL/uL (ref 4.22–5.81)
RDW: 15.5 % (ref 11.5–15.5)
WBC: 23.1 10*3/uL — ABNORMAL HIGH (ref 4.0–10.5)
nRBC: 0.1 % (ref 0.0–0.2)

## 2023-06-15 LAB — I-STAT ARTERIAL BLOOD GAS, ED
Acid-Base Excess: 16 mmol/L — ABNORMAL HIGH (ref 0.0–2.0)
Bicarbonate: 43.3 mmol/L — ABNORMAL HIGH (ref 20.0–28.0)
Calcium, Ion: 1.14 mmol/L — ABNORMAL LOW (ref 1.15–1.40)
HCT: 35 % — ABNORMAL LOW (ref 39.0–52.0)
Hemoglobin: 11.9 g/dL — ABNORMAL LOW (ref 13.0–17.0)
O2 Saturation: 100 %
Patient temperature: 97.1
Potassium: 4.1 mmol/L (ref 3.5–5.1)
Sodium: 135 mmol/L (ref 135–145)
TCO2: 45 mmol/L — ABNORMAL HIGH (ref 22–32)
pCO2 arterial: 65.8 mmHg (ref 32–48)
pH, Arterial: 7.423 (ref 7.35–7.45)
pO2, Arterial: 449 mmHg — ABNORMAL HIGH (ref 83–108)

## 2023-06-15 LAB — I-STAT CG4 LACTIC ACID, ED: Lactic Acid, Venous: 2 mmol/L (ref 0.5–1.9)

## 2023-06-15 LAB — I-STAT CHEM 8, ED
BUN: 38 mg/dL — ABNORMAL HIGH (ref 8–23)
Calcium, Ion: 0.98 mmol/L — ABNORMAL LOW (ref 1.15–1.40)
Chloride: 91 mmol/L — ABNORMAL LOW (ref 98–111)
Creatinine, Ser: 0.9 mg/dL (ref 0.61–1.24)
Glucose, Bld: 176 mg/dL — ABNORMAL HIGH (ref 70–99)
HCT: 50 % (ref 39.0–52.0)
Hemoglobin: 17 g/dL (ref 13.0–17.0)
Potassium: 4.7 mmol/L (ref 3.5–5.1)
Sodium: 135 mmol/L (ref 135–145)
TCO2: 40 mmol/L — ABNORMAL HIGH (ref 22–32)

## 2023-06-15 LAB — CULTURE, BLOOD (ROUTINE X 2)
Culture: NO GROWTH
Culture: NO GROWTH

## 2023-06-15 LAB — LACTIC ACID, PLASMA
Lactic Acid, Venous: 2.4 mmol/L (ref 0.5–1.9)
Lactic Acid, Venous: 4.7 mmol/L (ref 0.5–1.9)

## 2023-06-15 LAB — BRAIN NATRIURETIC PEPTIDE: B Natriuretic Peptide: 418.2 pg/mL — ABNORMAL HIGH (ref 0.0–100.0)

## 2023-06-15 LAB — MRSA NEXT GEN BY PCR, NASAL: MRSA by PCR Next Gen: NOT DETECTED

## 2023-06-15 MED ORDER — ENOXAPARIN SODIUM 40 MG/0.4ML IJ SOSY: 40.0000 mg | PREFILLED_SYRINGE | INTRAMUSCULAR | Status: DC

## 2023-06-15 MED ORDER — MAGNESIUM SULFATE 2 GM/50ML IV SOLN
2.0000 g | Freq: Once | INTRAVENOUS | Status: AC
  Administered 2023-06-15: 2 g via INTRAVENOUS
  Filled 2023-06-15: qty 50

## 2023-06-15 MED ORDER — FLUTICASONE FUROATE-VILANTEROL 100-25 MCG/ACT IN AEPB
1.0000 | INHALATION_SPRAY | Freq: Every day | RESPIRATORY_TRACT | Status: DC
  Filled 2023-06-15: qty 28

## 2023-06-15 MED ORDER — DOCUSATE SODIUM 100 MG PO CAPS: 100.0000 mg | ORAL_CAPSULE | Freq: Two times a day (BID) | ORAL | Status: DC | PRN

## 2023-06-15 MED ORDER — FENTANYL CITRATE PF 50 MCG/ML IJ SOSY: 25.0000 ug | PREFILLED_SYRINGE | Freq: Once | INTRAMUSCULAR | Status: DC

## 2023-06-15 MED ORDER — ARFORMOTEROL TARTRATE 15 MCG/2ML IN NEBU: 15.0000 ug | INHALATION_SOLUTION | Freq: Two times a day (BID) | RESPIRATORY_TRACT | Status: DC

## 2023-06-15 MED ORDER — LACTATED RINGERS IV BOLUS
500.0000 mL | Freq: Once | INTRAVENOUS | Status: AC
  Administered 2023-06-15: 500 mL via INTRAVENOUS

## 2023-06-15 MED ORDER — STERILE WATER FOR INJECTION IJ SOLN
INTRAMUSCULAR | Status: AC
  Administered 2023-06-15: 5 mL
  Filled 2023-06-15: qty 10

## 2023-06-15 MED ORDER — ORAL CARE MOUTH RINSE: 15.0000 mL | OROMUCOSAL | Status: DC | PRN

## 2023-06-15 MED ORDER — ALBUTEROL SULFATE (2.5 MG/3ML) 0.083% IN NEBU
10.0000 mg/h | INHALATION_SOLUTION | RESPIRATORY_TRACT | Status: DC
  Administered 2023-06-15: 10 mg/h via RESPIRATORY_TRACT
  Filled 2023-06-15: qty 9

## 2023-06-15 MED ORDER — CALCIUM GLUCONATE-NACL 1-0.675 GM/50ML-% IV SOLN
1.0000 g | Freq: Once | INTRAVENOUS | Status: AC
  Administered 2023-06-15: 1000 mg via INTRAVENOUS
  Filled 2023-06-15: qty 50

## 2023-06-15 MED ORDER — ALBUTEROL (5 MG/ML) CONTINUOUS INHALATION SOLN
10.0000 mg/h | INHALATION_SOLUTION | RESPIRATORY_TRACT | Status: AC
  Administered 2023-06-15: 10 mg/h via RESPIRATORY_TRACT
  Filled 2023-06-15: qty 20

## 2023-06-15 MED ORDER — POLYETHYLENE GLYCOL 3350 17 G PO PACK: 17.0000 g | PACK | Freq: Every day | ORAL | Status: DC | PRN

## 2023-06-15 MED ORDER — ETOMIDATE 2 MG/ML IV SOLN
INTRAVENOUS | Status: DC | PRN
  Administered 2023-06-15: 20 mg via INTRAVENOUS

## 2023-06-15 MED ORDER — LACTATED RINGERS IV BOLUS: 1000.0000 mL | Freq: Once | INTRAVENOUS | Status: DC

## 2023-06-15 MED ORDER — APIXABAN 5 MG PO TABS
5.0000 mg | ORAL_TABLET | Freq: Two times a day (BID) | ORAL | Status: DC
  Administered 2023-06-15 – 2023-06-16 (×3): 5 mg
  Filled 2023-06-15 (×3): qty 1

## 2023-06-15 MED ORDER — ACETAZOLAMIDE SODIUM 500 MG IJ SOLR
500.0000 mg | Freq: Four times a day (QID) | INTRAMUSCULAR | Status: AC
  Administered 2023-06-15 – 2023-06-16 (×2): 500 mg via INTRAVENOUS
  Filled 2023-06-15 (×4): qty 500

## 2023-06-15 MED ORDER — ORAL CARE MOUTH RINSE
15.0000 mL | OROMUCOSAL | Status: DC
  Administered 2023-06-15 – 2023-06-16 (×12): 15 mL via OROMUCOSAL

## 2023-06-15 MED ORDER — SODIUM CHLORIDE 0.9 % IV BOLUS
500.0000 mL | Freq: Once | INTRAVENOUS | Status: AC
  Administered 2023-06-15: 500 mL via INTRAVENOUS

## 2023-06-15 MED ORDER — INSULIN ASPART 100 UNIT/ML IJ SOLN
0.0000 [IU] | INTRAMUSCULAR | Status: DC
  Administered 2023-06-15 (×2): 5 [IU] via SUBCUTANEOUS
  Administered 2023-06-16 (×2): 3 [IU] via SUBCUTANEOUS
  Administered 2023-06-16: 2 [IU] via SUBCUTANEOUS
  Administered 2023-06-16: 5 [IU] via SUBCUTANEOUS

## 2023-06-15 MED ORDER — DEXMEDETOMIDINE HCL IN NACL 400 MCG/100ML IV SOLN
0.0000 ug/kg/h | INTRAVENOUS | Status: DC
  Administered 2023-06-15: 0.4 ug/kg/h via INTRAVENOUS
  Administered 2023-06-15: 0.7 ug/kg/h via INTRAVENOUS
  Administered 2023-06-16: 0.5 ug/kg/h via INTRAVENOUS
  Administered 2023-06-16: 0.8 ug/kg/h via INTRAVENOUS
  Filled 2023-06-15 (×4): qty 100

## 2023-06-15 MED ORDER — METHYLPREDNISOLONE SODIUM SUCC 125 MG IJ SOLR
125.0000 mg | Freq: Every day | INTRAMUSCULAR | Status: DC
  Administered 2023-06-15: 125 mg via INTRAVENOUS
  Filled 2023-06-15: qty 2

## 2023-06-15 MED ORDER — FAMOTIDINE 20 MG PO TABS
20.0000 mg | ORAL_TABLET | Freq: Two times a day (BID) | ORAL | Status: DC
  Administered 2023-06-15 – 2023-06-16 (×3): 20 mg
  Filled 2023-06-15 (×3): qty 1

## 2023-06-15 MED ORDER — IPRATROPIUM-ALBUTEROL 0.5-2.5 (3) MG/3ML IN SOLN: 3.0000 mL | Freq: Four times a day (QID) | RESPIRATORY_TRACT | Status: DC

## 2023-06-15 MED ORDER — FLUCONAZOLE IN SODIUM CHLORIDE 200-0.9 MG/100ML-% IV SOLN
200.0000 mg | INTRAVENOUS | Status: DC
  Administered 2023-06-15: 200 mg via INTRAVENOUS
  Filled 2023-06-15 (×3): qty 100

## 2023-06-15 MED ORDER — PROPOFOL 10 MG/ML IV BOLUS
INTRAVENOUS | Status: AC
  Filled 2023-06-15: qty 20

## 2023-06-15 MED ORDER — PROPOFOL 10 MG/ML IV BOLUS: 30.0000 mg | Freq: Once | INTRAVENOUS | Status: DC

## 2023-06-15 MED ORDER — PIPERACILLIN-TAZOBACTAM 3.375 G IVPB
3.3750 g | Freq: Three times a day (TID) | INTRAVENOUS | Status: DC
  Administered 2023-06-15 – 2023-06-19 (×11): 3.375 g via INTRAVENOUS
  Filled 2023-06-15 (×11): qty 50

## 2023-06-15 MED ORDER — REVEFENACIN 175 MCG/3ML IN SOLN
175.0000 ug | Freq: Every day | RESPIRATORY_TRACT | Status: DC
  Administered 2023-06-15 – 2023-06-19 (×4): 175 ug via RESPIRATORY_TRACT
  Filled 2023-06-15 (×4): qty 3

## 2023-06-15 MED ORDER — METHYLPREDNISOLONE SODIUM SUCC 125 MG IJ SOLR
60.0000 mg | Freq: Two times a day (BID) | INTRAMUSCULAR | Status: DC
  Administered 2023-06-16 (×3): 60 mg via INTRAVENOUS
  Filled 2023-06-15 (×3): qty 2

## 2023-06-15 MED ORDER — SODIUM CHLORIDE 0.9 % IV SOLN
250.0000 mL | INTRAVENOUS | Status: DC
  Administered 2023-06-15: 250 mL via INTRAVENOUS

## 2023-06-15 MED ORDER — FENTANYL BOLUS VIA INFUSION
25.0000 ug | INTRAVENOUS | Status: DC | PRN
Start: 2023-06-15 — End: 2023-06-16
  Administered 2023-06-16: 50 ug via INTRAVENOUS

## 2023-06-15 MED ORDER — VANCOMYCIN HCL 1500 MG/300ML IV SOLN
1500.0000 mg | Freq: Once | INTRAVENOUS | Status: AC
  Administered 2023-06-15: 1500 mg via INTRAVENOUS
  Filled 2023-06-15: qty 300

## 2023-06-15 MED ORDER — ROCURONIUM BROMIDE 10 MG/ML (PF) SYRINGE
PREFILLED_SYRINGE | INTRAVENOUS | Status: DC | PRN
  Administered 2023-06-15: 100 mg via INTRAVENOUS

## 2023-06-15 MED ORDER — DOCUSATE SODIUM 50 MG/5ML PO LIQD: 100.0000 mg | Freq: Two times a day (BID) | ORAL | Status: DC | PRN

## 2023-06-15 MED ORDER — ARFORMOTEROL TARTRATE 15 MCG/2ML IN NEBU
15.0000 ug | INHALATION_SOLUTION | Freq: Two times a day (BID) | RESPIRATORY_TRACT | Status: DC
  Administered 2023-06-15 – 2023-06-19 (×8): 15 ug via RESPIRATORY_TRACT
  Filled 2023-06-15 (×8): qty 2

## 2023-06-15 MED ORDER — NOREPINEPHRINE 4 MG/250ML-% IV SOLN
2.0000 ug/min | INTRAVENOUS | Status: DC
  Filled 2023-06-15: qty 250

## 2023-06-15 MED ORDER — PIPERACILLIN-TAZOBACTAM 3.375 G IVPB 30 MIN
3.3750 g | Freq: Once | INTRAVENOUS | Status: AC
  Administered 2023-06-15: 3.375 g via INTRAVENOUS
  Filled 2023-06-15: qty 50

## 2023-06-15 MED ORDER — KETAMINE HCL 50 MG/5ML IJ SOSY
PREFILLED_SYRINGE | INTRAMUSCULAR | Status: AC
  Filled 2023-06-15: qty 15

## 2023-06-15 MED ORDER — FENTANYL 2500MCG IN NS 250ML (10MCG/ML) PREMIX INFUSION
25.0000 ug/h | INTRAVENOUS | Status: DC
  Administered 2023-06-15: 50 ug/h via INTRAVENOUS
  Administered 2023-06-16: 125 ug/h via INTRAVENOUS
  Filled 2023-06-15 (×2): qty 250

## 2023-06-15 NOTE — ED Triage Notes (Signed)
 Pt from home and called out for shob, O2 was 50% on [place]. Hx of CHF and COPD. EMS gave 2 duonebs and 0.3 of IM epi. Pt arrives on cpap. Belly breathing.

## 2023-06-15 NOTE — ED Notes (Signed)
 CCMD CALLED

## 2023-06-15 NOTE — ED Notes (Signed)
 RN attempted report

## 2023-06-15 NOTE — ED Provider Notes (Signed)
 [place] EMERGENCY DEPARTMENT AT [facility] Provider Note   CSN: [id number] Arrival date & time: 06/15/23  1041     History  Chief Complaint  Patient presents with   Respiratory Distress    Johnny Rodriguez is a 73 y.o. male with a history of COPD, medical history as noted below for his most recent hospital discharge, presented to ED in respiratory distress.  Patient reports he arrived home yesterday from the hospital and has had worsening breathing overnight.  He wears 3 to 4 L nasal cannula at baseline.  EMS reports he is moving air very poorly bilaterally and he arrives on CPAP.  EMS gave the patient 2 DuoNebs and 0.3 mg of IM epinephrine .  They reported a home oxygen  level of "50% on nasal cannula".  Patient has a history of recurrent hospitalizations for severe ongoing chronic hypoxic respiratory failure, and was discharged yesterday from the hospital.  His ED course is summarized below from hospital discharge summary  "He was briefly placed on BiPAP but failed quickly and was intubated in ED Patient was extubated 06/11/23. 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 Patient discharged on 4/26 with same COPD exacerbation."  HPI     Home Medications Prior to Admission medications   Medication Sig Start Date End Date Taking? Authorizing Provider  albuterol  (PROVENTIL ) (2.5 MG/3ML) 0.083% nebulizer solution Take 3 mLs (2.5 mg total) by nebulization every 4 (four) hours as needed for wheezing or shortness of breath. Patient taking differently: Take 2.5 mg by nebulization 2 (two) times daily as needed for wheezing or shortness of breath. 05/16/23   Ghimire, Estil Heman, MD  aspirin  EC 81 MG tablet Take 1 tablet (81 mg total) by mouth daily. Swallow whole. 12/08/22   Ghimire, Estil Heman, MD  cefdinir  (OMNICEF) 300 MG capsule Take 1 capsule (300 mg total) by mouth 2 (two) times daily for 2 doses. 06/14/23 06/15/23  Shalhoub, Merrill Abide, MD  Cholecalciferol  (VITAMIN D -3 PO) Take 1 capsule by mouth daily.    [provider]  ELIQUIS  5 MG TABS tablet Take 1 tablet (5 mg total) by mouth 2 (two) times daily. 05/16/23   Ghimire, Estil Heman, MD  fluticasone  furoate-vilanterol (BREO ELLIPTA ) 100-25 MCG/ACT AEPB Inhale 1 puff into the lungs daily. Patient taking differently: Inhale 2 puffs into the lungs daily. 05/16/23   Ghimire, Estil Heman, MD  folic acid  (FOLVITE ) 1 MG tablet Take 1 tablet (1 mg total) by mouth daily. 05/16/23   Ghimire, Estil Heman, MD  gabapentin  (NEURONTIN ) 400 MG capsule Take 1 capsule (400 mg total) by mouth daily as needed (for nerve pain). Patient taking differently: Take 400 mg by mouth daily. 05/16/23   Ghimire, Estil Heman, MD  linaclotide  (LINZESS ) 145 MCG CAPS capsule Take 1 capsule (145 mcg total) by mouth daily before breakfast. 05/16/23   Ghimire, Estil Heman, MD  meloxicam (MOBIC) 15 MG tablet Take 15 mg by mouth daily. 06/05/23   [provider]  Metoprolol  Tartrate 37.5 MG TABS Take 1 tablet (37.5 mg total) by mouth 2 (two) times daily. 06/14/23 07/14/23  Shalhoub, Merrill Abide, MD  OXYGEN  Inhale 3 L/min into the lungs continuous.    [provider]  pantoprazole  (PROTONIX ) 40 MG tablet Take 1 tablet (40 mg total) by mouth daily as needed (acid reflux). Patient taking differently: Take 40  mg by mouth daily. 05/16/23   Ghimire, Estil Heman, MD  polyethylene glycol (MIRALAX  / GLYCOLAX ) 17 g packet Take 17 g by mouth daily. Patient taking differently: Take 17 g by mouth daily as needed for mild constipation or moderate constipation. 03/15/19   Zoanne Hinders, MD  pravastatin (PRAVACHOL) 10 MG tablet Take 10 mg by mouth daily. 06/04/23   [provider]  predniSONE  (DELTASONE ) 10 MG tablet Take 5 tablets (50 mg total) by mouth daily for 3 days, THEN 4 tablets (40 mg total)  daily for 3 days, THEN 3 tablets (30 mg total) daily for 3 days, THEN 2 tablets (20 mg total) daily for 3 days, THEN 1 tablet (10 mg total) daily for 3 days. 06/14/23 06/29/23  Shalhoub, Merrill Abide, MD  senna (SENOKOT) 8.6 MG TABS tablet Take 1-2 tablets (8.6-17.2 mg total) by mouth daily. Patient taking differently: Take 2 tablets by mouth daily as needed for moderate constipation. 03/15/19   Zoanne Hinders, MD  SPIRIVA  RESPIMAT 2.5 MCG/ACT AERS Inhale 2 puffs into the lungs daily. 05/16/23   Ghimire, Estil Heman, MD  traZODone  (DESYREL ) 50 MG tablet Take 1 tablet (50 mg total) by mouth at bedtime as needed for sleep. Patient taking differently: Take 50 mg by mouth daily. 05/16/23   Ghimire, Estil Heman, MD  VENTOLIN  HFA 108 (90 Base) MCG/ACT inhaler Inhale 1-2 puffs into the lungs every 6 (six) hours as needed for wheezing or shortness of breath Patient taking differently: Inhale 2 puffs into the lungs every 6 (six) hours as needed for wheezing or shortness of breath. 05/16/23   Ghimire, Estil Heman, MD      Allergies    Patient has no known allergies.    Review of Systems   Review of Systems  Physical Exam Updated Vital Signs BP 107/72   Pulse (!) 36   Temp (!) 97.1 F (36.2 C) (Bladder)   Resp 16   SpO2 100%  Physical Exam Constitutional:      General: He is not in acute distress. HENT:     Head: Normocephalic and atraumatic.  Eyes:     Conjunctiva/sclera: Conjunctivae normal.     Pupils: Pupils are equal, round, and reactive to light.  Cardiovascular:     Rate and Rhythm: Regular rhythm. Tachycardia present.  Pulmonary:     Effort: Pulmonary effort is normal. No respiratory distress.     Comments: Patient arrives on CPAP, very poor air movement bilaterally, retractions Abdominal:     General: There is no distension.     Tenderness: There is no abdominal tenderness.  Skin:    General: Skin is warm and dry.  Neurological:     General: No focal deficit present.     Mental Status: He is  alert. Mental status is at baseline.  Psychiatric:        Mood and Affect: Mood normal.        Behavior: Behavior normal.     ED Results / Procedures / Treatments   Labs (all labs ordered are listed, but only abnormal results are displayed) Labs Reviewed  BASIC METABOLIC PANEL WITH GFR - Abnormal; Notable for the following components:      Result Value   Chloride 92 (*)    CO2 38 (*)    Glucose, Bld 169 (*)    BUN 29 (*)    Calcium  8.6 (*)    All other components within normal limits  CBC WITH DIFFERENTIAL/PLATELET - Abnormal; Notable for the following components:  WBC 23.1 (*)    MCV 106.6 (*)    MCHC 29.6 (*)    Neutro Abs 19.0 (*)    Monocytes Absolute 2.3 (*)    Abs Immature Granulocytes 0.25 (*)    All other components within normal limits  BRAIN NATRIURETIC PEPTIDE - Abnormal; Notable for the following components:   B Natriuretic Peptide 418.2 (*)    All other components within normal limits  I-STAT VENOUS BLOOD GAS, ED - Abnormal; Notable for the following components:   pCO2, Ven 74.0 (*)    Bicarbonate 43.2 (*)    TCO2 45 (*)    Acid-Base Excess 14.0 (*)    Sodium 134 (*)    Calcium , Ion 1.00 (*)    All other components within normal limits  I-STAT CHEM 8, ED - Abnormal; Notable for the following components:   Chloride 91 (*)    BUN 38 (*)    Glucose, Bld 176 (*)    Calcium , Ion 0.98 (*)    TCO2 40 (*)    All other components within normal limits  I-STAT CG4 LACTIC ACID, ED - Abnormal; Notable for the following components:   Lactic Acid, Venous 2.0 (*)    All other components within normal limits  I-STAT ARTERIAL BLOOD GAS, ED - Abnormal; Notable for the following components:   pCO2 arterial 65.8 (*)    pO2, Arterial 449 (*)    Bicarbonate 43.3 (*)    TCO2 45 (*)    Acid-Base Excess 16.0 (*)    Calcium , Ion 1.14 (*)    HCT 35.0 (*)    Hemoglobin 11.9 (*)    All other components within normal limits  CULTURE, BLOOD (ROUTINE X 2)  CULTURE, BLOOD  (ROUTINE X 2)  MRSA NEXT GEN BY PCR, NASAL  CULTURE, RESPIRATORY W GRAM STAIN  BLOOD GAS, ARTERIAL  BLOOD GAS, ARTERIAL  I-STAT CG4 LACTIC ACID, ED  I-STAT VENOUS BLOOD GAS, ED  I-STAT CG4 LACTIC ACID, ED    EKG EKG Interpretation Date/Time:  Monday Jun 15 2023 10:53:16 EDT Ventricular Rate:  156 PR Interval:    QRS Duration:  103 QT Interval:  291 QTC Calculation: 474 R Axis:   262  Text Interpretation: Atrial fibrillation with rapid V-rate Inferior infarct, old Consider anterior infarct Confirmed by Jerald Molly [phone]) on 06/15/2023 11:55:44 AM  Radiology DG Chest Portable 1 View Result Date: 06/15/2023 CLINICAL DATA:  Shortness of breath.  Hypoxia. EXAM: PORTABLE CHEST 1 VIEW COMPARISON:  06/10/2023 FINDINGS: Heart size near the upper limit of normal. Hyperexpanded lungs. Mildly progressive pulmonary vascular congestion and accentuation of the interstitial markings. Interval small left pleural effusion. Mild bilateral glenohumeral degenerative changes and marked left AC joint degenerative changes. IMPRESSION: Mild acute CHF superimposed on COPD. Electronically Signed   By: Catherin Closs M.D.   On: 06/15/2023 11:53    Procedures .Critical Care  Performed by: Arvilla Birmingham, MD Authorized by: Arvilla Birmingham, MD   Critical care provider statement:    Critical care time (minutes):  40   Critical care time was exclusive of:  Separately billable procedures and treating other patients   Critical care was necessary to treat or prevent imminent or life-threatening deterioration of the following conditions:  Respiratory failure   Critical care was time spent personally by me on the following activities:  Ordering and performing treatments and interventions, ordering and review of laboratory studies, ordering and review of radiographic studies, pulse oximetry, review of old charts, examination of patient  and evaluation of patient's response to treatment   Care discussed with:  admitting provider       Medications Ordered in ED Medications  vancomycin  (VANCOREADY) IVPB 1500 mg/300 mL (1,500 mg Intravenous New Bag/Given 06/15/23 1210)  fentaNYL  (SUBLIMAZE ) injection 25 mcg (25 mcg Intravenous Not Given 06/15/23 1201)  fentaNYL  in NS (55mcg/ml) infusion-PREMIX (50 mcg/hr Intravenous New Bag/Given 06/15/23 1158)  fentaNYL  (SUBLIMAZE ) bolus via infusion 25-100 mcg (has no administration in time range)  etomidate  (AMIDATE ) injection (20 mg Intravenous Given 06/15/23 1138)  rocuronium  (ZEMURON ) injection (100 mg Intravenous Given 06/15/23 1139)  0.9 %  sodium chloride  infusion (250 mLs Intravenous Not Given 06/15/23 1208)  norepinephrine  (LEVOPHED ) 4mg  in (0.016 mg/mL) premix infusion (has no administration in time range)  polyethylene glycol (MIRALAX  / GLYCOLAX ) packet 17 g (has no administration in time range)  enoxaparin  (LOVENOX ) injection 40 mg (has no administration in time range)  famotidine  (PEPCID ) tablet 20 mg (has no administration in time range)  insulin  aspart (novoLOG ) injection 0-15 Units (has no administration in time range)  fluconazole (DIFLUCAN) IVPB 200 mg (has no administration in time range)  acetaZOLAMIDE  (DIAMOX ) injection 500 mg (has no administration in time range)  methylPREDNISolone  sodium succinate (SOLU-MEDROL ) 125 mg/2 mL injection 60 mg (has no administration in time range)  docusate (COLACE) 50 MG/5ML liquid 100 mg (has no administration in time range)  piperacillin -tazobactam (ZOSYN ) IVPB 3.375 g (has no administration in time range)  calcium  gluconate 1 g/ 50 mL sodium chloride  IVPB (has no administration in time range)  revefenacin  (YUPELRI ) nebulizer solution 175 mcg (has no administration in time range)  arformoterol  (BROVANA ) nebulizer solution 15 mcg (has no administration in time range)  propofol  (DIPRIVAN ) 10 mg/mL bolus/IV push 30 mg (has no administration in time range)  propofol  (DIPRIVAN ) 10 mg/mL bolus/IV push (has  no administration in time range)  magnesium  sulfate IVPB 2 g 50 mL (0 g Intravenous Stopped 06/15/23 1207)  sodium chloride  0.9 % bolus 500 mL (500 mLs Intravenous New Bag/Given 06/15/23 1151)  piperacillin -tazobactam (ZOSYN ) IVPB 3.375 g (0 g Intravenous Stopped 06/15/23 1233)    ED Course/ Medical Decision Making/ A&P Clinical Course as of 06/15/23 1301  Mon Jun 15, 2023  1109 Consulted pulm crit.  No acidosis on VBG - pCO2 chronically elevated with bicarb up, actually PCO2 better than last admission.  WBC elevated today with lactate up - will broaden to sepsis workup, xray pending.  Pt awakens to sternal rub and converses while on bipap, I do not see an emergent indication for intubation.  WOB appears improved from retraction standpoint. But he will need to be watched in ICU and remains critically ill [MT]  1232 ICU admitted and decided to intubate [MT]  1259 Following admission I was called back to the room as the patient's ET tube had been accidentally dislodged as he was being transported.  On my reassessment the patient was still ventilating well, saturating 100% on o2 monitor.  I used glidescope was able to visualize that the ET tube appears to have been pulled back now with balloon at the level of the vocal cords.  We deflated the balloon and advanced the ET tube to 26 cm at the lips, reinflated balloon.  Repeat xray ordered.  Pt needed additional sedation, bolused 100 mcg fentanyl  and 30 mg propofol  [MT]  1301 BP and Map stable [MT]    Clinical Course User Index [MT] Yianni Skilling, Janalyn Me, MD  Medical Decision Making Amount and/or Complexity of Data Reviewed Labs: ordered. Radiology: ordered. ECG/medicine tests: ordered.  Risk Prescription drug management. Decision regarding hospitalization.   This patient presents to the ED with concern for shortness of breath. This involves an extensive number of treatment options, and is a complaint that carries with it  a high risk of complications and morbidity.  The differential diagnosis includes COPD versus congestive heart failure versus pleural effusion versus pneumonia versus other  Co-morbidities that complicate the patient evaluation: COPD, CHF  Additional history obtained from EMS  External records from outside source obtained and reviewed including hospital discharge summary  I ordered and personally interpreted labs.  The pertinent results include: White blood cell count elevated from yesterday, lactate 2.0.  I ordered imaging studies including dg chest I independently visualized and interpreted imaging which showed CHF and COPD pattern I agree with the radiologist interpretation  The patient was maintained on a cardiac monitor.  I personally viewed and interpreted the cardiac monitored which showed an underlying rhythm of: Borderline sinus bradycardia  Per my interpretation the patient's ECG shows regular heart rate  I ordered medication including IV bolus and broad-spectrum antibiotics for sepsis, possible pneumonia; IV steroids, magnesium  and nebulizer treatment for COPD  I have reviewed the patients home medicines and have made adjustments as needed  Test Considered: Low suspicion for acute PE, or intra-abdominal infection  I requested consultation with the ICU Pulm Crit,  and discussed lab and imaging findings as well as pertinent plan - they recommend: ICU admit    Dispostion:  After consideration of the diagnostic results and the patients response to treatment, I feel that the patent would benefit from medical admission         Final Clinical Impression(s) / ED Diagnoses Final diagnoses:  COPD exacerbation (HCC)  Sepsis, due to unspecified organism, unspecified whether acute organ dysfunction present [facility])    Rx / DC Orders ED Discharge Orders     None         Jamieon Lannen, Janalyn Me, MD 06/15/23 1301

## 2023-06-15 NOTE — Progress Notes (Signed)
 RT transported patient to 2M08 via the ventilator.  Ventilator plugged into a red outlet and oxygen  and air hoses connected to the wall.

## 2023-06-15 NOTE — Progress Notes (Signed)
 ED Pharmacy Antibiotic Sign Off An antibiotic consult was received from an ED provider for Zosyn  and vancomycin  per pharmacy dosing for sepsis. A chart review was completed to assess appropriateness.   The following one time order(s) were placed:  Zosyn  3.375 gm IV x1  Vancomycin  1,500 mg IV x1  Further antibiotic and/or antibiotic pharmacy consults should be ordered by the admitting provider if indicated.   Thank you for allowing pharmacy to be a part of this patient's care.   Adaline Ada, [facility]  Clinical Pharmacist 06/15/23 11:18 AM

## 2023-06-15 NOTE — ED Notes (Signed)
RN attempted report x2 

## 2023-06-15 NOTE — H&P (Addendum)
 NAME:  Johnny Rodriguez, MRN:  [id number], DOB:  May 30, 1950, LOS: 0 ADMISSION DATE:  06/15/2023, CONSULTATION DATE:  06/15/23 REFERRING MD:  EDP, CHIEF COMPLAINT:  respiratory failure    History of Present Illness:  Johnny Rodriguez is a 73 y.o. M with PMH significant for COPD on home O2, PE on Eliquis , HFrEF with EF 35-40%, HTN and chronic pain who presented to the ED after being discharged home from the hospital 5/4 with increased shortness of breath and was hypoxic to 50% on [place] on arrival.  He was placed on Bipap, given nebs but remained in distress with accessory muscle use and diaphoreses.  ABG showed pCo2 of 74, though compensated with a pH of 7.3, his lactic acid was 2.0 and WBC had increased to 23k from 13.6 the day before.  Given his level of respiratory distress PCCM was consulted and pt was intubated in the ED and admitted to the ICU  Pertinent  Medical History   has a past medical history of AF (atrial fibrillation) (HCC), Alcohol abuse, Angina pectoris, CAD (coronary artery disease) (12/15/2004), COPD (chronic obstructive pulmonary disease) (HCC), Coronary artery disease, Heavy cigarette smoker, HTN (hypertension), Idiopathic cardiomyopathy (HCC) (07/16/2009), Non compliance with medical treatment, Pulmonary embolism (HCC) (03/27/2022), and Seizures (HCC).   Significant Hospital Events: Including procedures, antibiotic start and stop dates in addition to other pertinent events   5/5 presented one day after discharge hypoxic and hypercarbic and was intubated in the ED   Interim History / Subjective:  As above   Objective   Blood pressure (!) 66/51, pulse 100, temperature (!) 95.8 F (35.4 C), temperature source Temporal, resp. rate 16, SpO2 100%.    Vent Mode: BIPAP FiO2 (%):  [32 %-100 %] 60 % Set Rate:  [15 bmp] 15 bmp PEEP:  [8 cmH20] 8 cmH20  No intake or output data in the 24 hours ending 06/15/23 1156 There were no vitals filed for this visit.  General:  critically  ill-appearing M, diaphoretic and in respiratory distress HEENT: MM pink/moist, Bipap mask in place  Neuro: nods to questions, moves extremities to command CV: s1s2 rrr, no m/r/g PULM:  minimal air entry, tachypneic accessory muscle use  GI: soft, bsx4 active  Extremities: warm/dry, 2+ pedal edema  Skin: no rashes or lesions  Resolved Hospital Problem list     Assessment & Plan:    Acute on chronic hypoxic and hypercarbic respiratory failure  Possible aspiration event vs COPD exacerbation  Concern for sepsis  -intubated in the ED, follow repeat ABG  -Fentanyl  for PAD protocol -Zosyn , blood and respiratory cultures -1L IVF and follow lactic acid --Maintain full vent support with SAT/SBT as tolerated -titrate Vent setting to maintain SpO2 greater than or equal to 90%. -HOB elevated 30 degrees. -Plateau pressures less than 30 cm H20.  -Follow chest x-ray, ABG prn.   -Bronchial hygiene and RT/bronchodilator protocol. -suspect hypotension is sedation related, norepi to maintain MAP >65 -yupelri , brovana  and budesonide  nebs -solumedrol 60mg  bid  Hx of DVT/PE PAF -continue Eliquis   -BNP 418   HFrEF  Last echo 35-40% -appears volume depleted on echo -1L LR   Hyperglycemia  -SSI  Best Practice (right click and "Reselect all SmartList Selections" daily)   Diet/type: NPO DVT prophylaxis LMWH Pressure ulcer(s): N/A GI prophylaxis: H2B Lines: N/A Foley:  Yes, and it is still needed Code Status:  full code Last date of multidisciplinary goals of care discussion [pending]  Labs   CBC: Recent Labs  Lab 06/10/23  0117 06/10/23 1610 06/11/23 0827 06/12/23 0259 06/13/23 0304 06/14/23 0629 06/15/23 1052 06/15/23 1058 06/15/23 1059  WBC 12.1*   < > 8.4 7.6 9.3 13.6* 23.1*  --   --   NEUTROABS 9.9*  --   --   --  8.6* 12.9* 19.0*  --   --   HGB 14.3   < > 13.0 12.6* 12.0* 11.4* 14.8 16.0 17.0  HCT 48.0   < > 42.7 43.3 40.0 39.1 50.0 47.0 50.0  MCV 106.7*   < > 103.6*  108.8* 106.1* 108.9* 106.6*  --   --   PLT 224   < > 198 176 202 192 264  --   --    < > = values in this interval not displayed.    Basic Metabolic Panel: Recent Labs  Lab 06/10/23 0332 06/10/23 1721 06/11/23 0827 06/12/23 0259 06/12/23 1207 06/13/23 0304 06/13/23 1322 06/14/23 0629 06/15/23 1052 06/15/23 1058 06/15/23 1059  NA 140 140   < > 139  --  140 140 139 139 134* 135  K 5.5* 3.9   < > 4.6  --  5.2* 4.6 4.9 4.9 4.8 4.7  CL 92* 97*   < > 97*  --  93* 91* 92* 92*  --  91*  CO2 35* 33*   < > 31  --  40* 39* 43* 38*  --   --   GLUCOSE 163* 163*   < > 225*  --  145* 281* 248* 169*  --  176*  BUN 44* 38*   < > 34*  --  35* 36* 29* 29*  --  38*  CREATININE 0.71 0.72   < > 0.84  --  0.84 0.87 0.73 0.80  --  0.90  CALCIUM  8.4* 8.6*   < > 8.3*  --  8.8* 8.6* 8.4* 8.6*  --   --   MG 2.5* 2.4  --   --  2.3 2.4  --  2.1  --   --   --   PHOS 3.5  --   --   --   --  3.1  --  2.4*  --   --   --    < > = values in this interval not displayed.   GFR: Estimated Creatinine Clearance: 80.2 mL/min (by C-G formula based on SCr of 0.9 mg/dL). Recent Labs  Lab 06/10/23 0127 06/10/23 0332 06/10/23 0422 06/10/23 1640 06/11/23 0827 06/12/23 0259 06/13/23 0304 06/14/23 0629 06/15/23 1052 06/15/23 1059  WBC  --    < >  --   --    < > 7.6 9.3 13.6* 23.1*  --   LATICACIDVEN 1.7  --  3.1* 1.4  --   --   --   --   --  2.0*   < > = values in this interval not displayed.    Liver Function Tests: Recent Labs  Lab 06/10/23 0117 06/13/23 0304  AST 25 13*  ALT 45* 23  ALKPHOS 63 45  BILITOT 1.2 0.5  PROT 6.9 5.5*  ALBUMIN  3.9 2.9*   No results for input(s): "LIPASE", "AMYLASE" in the last 168 hours. No results for input(s): "AMMONIA" in the last 168 hours.  ABG    Component Value Date/Time   PHART 7.58 (H) 06/10/2023 1314   PCO2ART 44 06/10/2023 1314   PO2ART 102 06/10/2023 1314   HCO3 43.2 (H) 06/15/2023 1058   TCO2 40 (H) 06/15/2023 1059   ACIDBASEDEF 4.0 (H) 03/29/2017  1604   O2SAT 55 06/15/2023 1058     Coagulation Profile: No results for input(s): "INR", "PROTIME" in the last 168 hours.  Cardiac Enzymes: No results for input(s): "CKTOTAL", "CKMB", "CKMBINDEX", "TROPONINI" in the last 168 hours.  HbA1C: HbA1c, POC (prediabetic range)  Date/Time Value Ref Range Status  08/15/2022 01:34 PM 6.3 5.7 - 6.4 % Final   HbA1c, POC (controlled diabetic range)  Date/Time Value Ref Range Status  08/15/2022 03:08 PM 6.3 0.0 - 7.0 % Final   Hgb A1c MFr Bld  Date/Time Value Ref Range Status  05/06/2023 05:14 PM 6.1 (H) 4.8 - 5.6 % Final    Comment:    (NOTE) Pre diabetes:          5.7%-6.4%  Diabetes:              >6.4%  Glycemic control for   <7.0% adults with diabetes     CBG: Recent Labs  Lab 06/13/23 1209 06/13/23 1610 06/13/23 2135 06/14/23 0753 06/14/23 1158  GLUCAP 261* 180* 233* 203* 157*    Review of Systems:   Unable to obtain  Past Medical History:  He,  has a past medical history of AF (atrial fibrillation) (HCC), Alcohol abuse, Angina pectoris, CAD (coronary artery disease) (12/15/2004), COPD (chronic obstructive pulmonary disease) (HCC), Coronary artery disease, Heavy cigarette smoker, HTN (hypertension), Idiopathic cardiomyopathy (HCC) (07/16/2009), Non compliance with medical treatment, Pulmonary embolism (HCC) (03/27/2022), and Seizures (HCC).   Surgical History:   Past Surgical History:  Procedure Laterality Date   CARDIAC CATHETERIZATION  2006   CARDIAC CATHETERIZATION  12/2004   Left    CARDIOVERSION  01/21/2011   Procedure: CARDIOVERSION;  Surgeon: Elaina Graver., MD;  Location: [facility];  Service: Cardiovascular;  Laterality: N/A;   DOPPLER ECHOCARDIOGRAPHY  02/2011   ELECTROPHYSIOLOGY STUDY N/A 12/16/2011   Procedure: ELECTROPHYSIOLOGY STUDY;  Surgeon: Tammie Fall, MD;  Location: [facility] CATH LAB;  Service: Cardiovascular;  Laterality: N/A;   EP IMPLANTABLE DEVICE N/A 08/24/2015   Procedure: Loop Recorder  Removal;  Surgeon: Tammie Fall, MD;  Location: [facility] INVASIVE CV LAB;  Service: Cardiovascular;  Laterality: N/A;   fx collar bone     NO PAST SURGERIES       Social History:   reports that he has been smoking cigarettes. He has a 58 pack-year smoking history. He has never used smokeless tobacco. He reports current alcohol use. He reports current drug use. Drug: Marijuana.   Family History:  His family history includes COPD in his father; CVA in his mother; Coronary artery disease in his father; Heart disease in his father; Stroke in his mother; Suicidality in his sister.   Allergies No Known Allergies   Home Medications  Prior to Admission medications   Medication Sig Start Date End Date Taking? Authorizing Provider  albuterol  (PROVENTIL ) (2.5 MG/3ML) 0.083% nebulizer solution Take 3 mLs (2.5 mg total) by nebulization every 4 (four) hours as needed for wheezing or shortness of breath. Patient taking differently: Take 2.5 mg by nebulization 2 (two) times daily as needed for wheezing or shortness of breath. 05/16/23   Ghimire, Estil Heman, MD  aspirin  EC 81 MG tablet Take 1 tablet (81 mg total) by mouth daily. Swallow whole. 12/08/22   Ghimire, Estil Heman, MD  cefdinir (OMNICEF) 300 MG capsule Take 1 capsule (300 mg total) by mouth 2 (two) times daily for 2 doses. 06/14/23 06/15/23  True Fuss, MD  Cholecalciferol  (VITAMIN D -3 PO) Take 1 capsule by  mouth daily.    [provider]  ELIQUIS  5 MG TABS tablet Take 1 tablet (5 mg total) by mouth 2 (two) times daily. 05/16/23   Ghimire, Estil Heman, MD  fluticasone  furoate-vilanterol (BREO ELLIPTA ) 100-25 MCG/ACT AEPB Inhale 1 puff into the lungs daily. Patient taking differently: Inhale 2 puffs into the lungs daily. 05/16/23   Ghimire, Estil Heman, MD  folic acid  (FOLVITE ) 1 MG tablet Take 1 tablet (1 mg total) by mouth daily. 05/16/23   Ghimire, Estil Heman, MD  gabapentin  (NEURONTIN ) 400 MG capsule Take 1 capsule (400 mg total) by mouth daily as  needed (for nerve pain). Patient taking differently: Take 400 mg by mouth daily. 05/16/23   Ghimire, Estil Heman, MD  linaclotide  (LINZESS ) 145 MCG CAPS capsule Take 1 capsule (145 mcg total) by mouth daily before breakfast. 05/16/23   Ghimire, Estil Heman, MD  meloxicam (MOBIC) 15 MG tablet Take 15 mg by mouth daily. 06/05/23   [provider]  Metoprolol  Tartrate 37.5 MG TABS Take 1 tablet (37.5 mg total) by mouth 2 (two) times daily. 06/14/23 07/14/23  Shalhoub, Merrill Abide, MD  OXYGEN  Inhale 3 L/min into the lungs continuous.    [provider]  pantoprazole  (PROTONIX ) 40 MG tablet Take 1 tablet (40 mg total) by mouth daily as needed (acid reflux). Patient taking differently: Take 40 mg by mouth daily. 05/16/23   Ghimire, Estil Heman, MD  polyethylene glycol (MIRALAX  / GLYCOLAX ) 17 g packet Take 17 g by mouth daily. Patient taking differently: Take 17 g by mouth daily as needed for mild constipation or moderate constipation. 03/15/19   Zoanne Hinders, MD  pravastatin (PRAVACHOL) 10 MG tablet Take 10 mg by mouth daily. 06/04/23   [provider]  predniSONE  (DELTASONE ) 10 MG tablet Take 5 tablets (50 mg total) by mouth daily for 3 days, THEN 4 tablets (40 mg total) daily for 3 days, THEN 3 tablets (30 mg total) daily for 3 days, THEN 2 tablets (20 mg total) daily for 3 days, THEN 1 tablet (10 mg total) daily for 3 days. 06/14/23 06/29/23  Shalhoub, Merrill Abide, MD  senna (SENOKOT) 8.6 MG TABS tablet Take 1-2 tablets (8.6-17.2 mg total) by mouth daily. Patient taking differently: Take 2 tablets by mouth daily as needed for moderate constipation. 03/15/19   Zoanne Hinders, MD  SPIRIVA  RESPIMAT 2.5 MCG/ACT AERS Inhale 2 puffs into the lungs daily. 05/16/23   Ghimire, Estil Heman, MD  traZODone  (DESYREL ) 50 MG tablet Take 1 tablet (50 mg total) by mouth at bedtime as needed for sleep. Patient taking differently: Take 50 mg by mouth daily. 05/16/23   Ghimire, Estil Heman, MD  VENTOLIN  HFA 108 (90 Base) MCG/ACT  inhaler Inhale 1-2 puffs into the lungs every 6 (six) hours as needed for wheezing or shortness of breath Patient taking differently: Inhale 2 puffs into the lungs every 6 (six) hours as needed for wheezing or shortness of breath. 05/16/23   Burton Casey, MD     Critical care time: 45 minutes      CRITICAL CARE Performed by: Patt Boozer Cadence Minton   Total critical care time: 45 minutes  Critical care time was exclusive of separately billable procedures and treating other patients.  Critical care was necessary to treat or prevent imminent or life-threatening deterioration.  Critical care was time spent personally by me on the following activities: development of treatment plan with patient and/or surrogate as well as nursing, discussions with consultants, evaluation of patient's response to  treatment, examination of patient, obtaining history from patient or surrogate, ordering and performing treatments and interventions, ordering and review of laboratory studies, ordering and review of radiographic studies, pulse oximetry and re-evaluation of patient's condition.   Patt Boozer Kairee Kozma, PA-C [place] See Amion for pager If no response to pager , please call [phone] until 7pm After 7:00 pm call Elink  [phone]  '

## 2023-06-15 NOTE — Procedures (Signed)
 Intubation Procedure Note  Johnny Rodriguez  [id number]  1950/03/14  Date:06/15/23  Time:11:52 AM   Provider Performing:Eligha Kmetz R Jatavis Malek    Procedure: Intubation (31500)  Indication(s) Respiratory Failure  Consent Unable to obtain consent due to emergent nature of procedure.   Anesthesia Etomidate  and Rocuronium    Time Out Verified patient identification, verified procedure, site/side was marked, verified correct patient position, special equipment/implants available, medications/allergies/relevant history reviewed, required imaging and test results available.   Sterile Technique Usual hand hygeine, masks, and gloves were used   Procedure Description Patient positioned in bed supine.  Sedation given as noted above.  Patient was intubated with endotracheal tube using Glidescope.  View was Grade 1 full glottis .  Number of attempts was 1.  Colorimetric CO2 detector was consistent with tracheal placement.   Complications/Tolerance None; patient tolerated the procedure well. Chest X-ray is ordered to verify placement.   EBL Minimal   Specimen(s) None   Johnny Boozer Thirza Pell[place]ano, PA-C

## 2023-06-15 NOTE — Progress Notes (Addendum)
 Patient ETT was pulled out 3 cm when bed was moved for transport.  ER MD Trifan advanced ETT with the guidance of the glidescope to 27 cm at the lip.  CCM was notified and CXR ordered.

## 2023-06-16 DIAGNOSIS — J441 Chronic obstructive pulmonary disease with (acute) exacerbation: Secondary | ICD-10-CM | POA: Diagnosis not present

## 2023-06-16 DIAGNOSIS — R739 Hyperglycemia, unspecified: Secondary | ICD-10-CM

## 2023-06-16 DIAGNOSIS — J9621 Acute and chronic respiratory failure with hypoxia: Secondary | ICD-10-CM | POA: Diagnosis not present

## 2023-06-16 DIAGNOSIS — B370 Candidal stomatitis: Secondary | ICD-10-CM

## 2023-06-16 DIAGNOSIS — A419 Sepsis, unspecified organism: Secondary | ICD-10-CM | POA: Diagnosis not present

## 2023-06-16 DIAGNOSIS — J9622 Acute and chronic respiratory failure with hypercapnia: Secondary | ICD-10-CM | POA: Diagnosis not present

## 2023-06-16 LAB — BASIC METABOLIC PANEL WITH GFR
Anion gap: 17 — ABNORMAL HIGH (ref 5–15)
Anion gap: 15 (ref 5–15)
BUN: 26 mg/dL — ABNORMAL HIGH (ref 8–23)
BUN: 27 mg/dL — ABNORMAL HIGH (ref 8–23)
CO2: 27 mmol/L (ref 22–32)
CO2: 29 mmol/L (ref 22–32)
Calcium: 8.6 mg/dL — ABNORMAL LOW (ref 8.9–10.3)
Calcium: 9 mg/dL (ref 8.9–10.3)
Chloride: 94 mmol/L — ABNORMAL LOW (ref 98–111)
Chloride: 95 mmol/L — ABNORMAL LOW (ref 98–111)
Creatinine, Ser: 0.93 mg/dL (ref 0.61–1.24)
Creatinine, Ser: 0.95 mg/dL (ref 0.61–1.24)
GFR, Estimated: >60 mL/min (ref >60)
GFR, Estimated: >60 mL/min (ref >60)
Glucose, Bld: 177 mg/dL — ABNORMAL HIGH (ref 70–99)
Glucose, Bld: 124 mg/dL — ABNORMAL HIGH (ref 70–99)
Potassium: 4 mmol/L (ref 3.5–5.1)
Potassium: 3.8 mmol/L (ref 3.5–5.1)
Sodium: 138 mmol/L (ref 135–145)
Sodium: 139 mmol/L (ref 135–145)

## 2023-06-16 LAB — BLOOD GAS, ARTERIAL
Acid-Base Excess: 8.8 mmol/L — ABNORMAL HIGH (ref 0.0–2.0)
Bicarbonate: 34.1 mmol/L — ABNORMAL HIGH (ref 20.0–28.0)
Drawn by: 38235
O2 Saturation: 99.4 %
Patient temperature: 37.6
pCO2 arterial: 49 mmHg — ABNORMAL HIGH (ref 32–48)
pH, Arterial: 7.45 (ref 7.35–7.45)
pO2, Arterial: 126 mmHg — ABNORMAL HIGH (ref 83–108)

## 2023-06-16 LAB — CBC
HCT: 39.5 % (ref 39.0–52.0)
Hemoglobin: 13 g/dL (ref 13.0–17.0)
MCH: 31.8 pg (ref 26.0–34.0)
MCHC: 32.9 g/dL (ref 30.0–36.0)
MCV: 96.6 fL (ref 80.0–100.0)
Platelets: 196 10*3/uL (ref 150–400)
RBC: 4.09 MIL/uL — ABNORMAL LOW (ref 4.22–5.81)
RDW: 15.3 % (ref 11.5–15.5)
WBC: 13 10*3/uL — ABNORMAL HIGH (ref 4.0–10.5)
nRBC: 0 % (ref 0.0–0.2)

## 2023-06-16 LAB — PHOSPHORUS
Phosphorus: 3 mg/dL (ref 2.5–4.6)
Phosphorus: 5.3 mg/dL — ABNORMAL HIGH (ref 2.5–4.6)

## 2023-06-16 LAB — GLUCOSE, CAPILLARY
Glucose-Capillary: 166 mg/dL — ABNORMAL HIGH (ref 70–99)
Glucose-Capillary: 166 mg/dL — ABNORMAL HIGH (ref 70–99)
Glucose-Capillary: 149 mg/dL — ABNORMAL HIGH (ref 70–99)
Glucose-Capillary: 128 mg/dL — ABNORMAL HIGH (ref 70–99)
Glucose-Capillary: 200 mg/dL — ABNORMAL HIGH (ref 70–99)
Glucose-Capillary: 204 mg/dL — ABNORMAL HIGH (ref 70–99)

## 2023-06-16 LAB — MAGNESIUM
Magnesium: 2.3 mg/dL (ref 1.7–2.4)
Magnesium: 2.1 mg/dL (ref 1.7–2.4)

## 2023-06-16 LAB — LACTIC ACID, PLASMA: Lactic Acid, Venous: 2 mmol/L (ref 0.5–1.9)

## 2023-06-16 MED ORDER — APIXABAN 5 MG PO TABS
5.0000 mg | ORAL_TABLET | Freq: Two times a day (BID) | ORAL | Status: DC
  Administered 2023-06-16 – 2023-06-19 (×6): 5 mg via ORAL
  Filled 2023-06-16 (×6): qty 1

## 2023-06-16 MED ORDER — LORAZEPAM 1 MG PO TABS
1.0000 mg | ORAL_TABLET | ORAL | Status: DC | PRN
  Administered 2023-06-16: 2 mg via ORAL
  Administered 2023-06-17: 1 mg via ORAL
  Filled 2023-06-16: qty 1
  Filled 2023-06-16: qty 2

## 2023-06-16 MED ORDER — FUROSEMIDE 10 MG/ML IJ SOLN: 40.0000 mg | Freq: Once | INTRAMUSCULAR | Status: DC

## 2023-06-16 MED ORDER — AMIODARONE HCL IN DEXTROSE 360-4.14 MG/200ML-% IV SOLN
60.0000 mg/h | INTRAVENOUS | Status: DC
  Administered 2023-06-16 – 2023-06-17 (×3): 60 mg/h via INTRAVENOUS
  Filled 2023-06-16 (×2): qty 200

## 2023-06-16 MED ORDER — FLUCONAZOLE 200 MG PO TABS
200.0000 mg | ORAL_TABLET | Freq: Every day | ORAL | Status: DC
  Administered 2023-06-16: 200 mg
  Filled 2023-06-16: qty 1

## 2023-06-16 MED ORDER — ADULT MULTIVITAMIN W/MINERALS CH
1.0000 | ORAL_TABLET | Freq: Every day | ORAL | Status: DC
  Administered 2023-06-16 – 2023-06-19 (×4): 1 via ORAL
  Filled 2023-06-16 (×4): qty 1

## 2023-06-16 MED ORDER — FOLIC ACID 1 MG PO TABS
1.0000 mg | ORAL_TABLET | Freq: Every day | ORAL | Status: DC
  Administered 2023-06-16 – 2023-06-19 (×4): 1 mg via ORAL
  Filled 2023-06-16 (×4): qty 1

## 2023-06-16 MED ORDER — THIAMINE HCL 100 MG/ML IJ SOLN: 100.0000 mg | Freq: Every day | INTRAMUSCULAR | Status: DC

## 2023-06-16 MED ORDER — LORAZEPAM 2 MG/ML IJ SOLN: 1.0000 mg | INTRAMUSCULAR | Status: DC | PRN

## 2023-06-16 MED ORDER — CHLORHEXIDINE GLUCONATE CLOTH 2 % EX PADS
6.0000 | MEDICATED_PAD | Freq: Every day | CUTANEOUS | Status: DC
  Administered 2023-06-16 – 2023-06-18 (×3): 6 via TOPICAL

## 2023-06-16 MED ORDER — AMIODARONE HCL IN DEXTROSE 360-4.14 MG/200ML-% IV SOLN
30.0000 mg/h | INTRAVENOUS | Status: DC
  Administered 2023-06-17 (×2): 30 mg/h via INTRAVENOUS
  Filled 2023-06-16 (×2): qty 200

## 2023-06-16 MED ORDER — AMIODARONE LOAD VIA INFUSION
150.0000 mg | Freq: Once | INTRAVENOUS | Status: AC
  Administered 2023-06-16: 150 mg via INTRAVENOUS
  Filled 2023-06-16: qty 83.34

## 2023-06-16 MED ORDER — POTASSIUM CHLORIDE CRYS ER 20 MEQ PO TBCR
20.0000 meq | EXTENDED_RELEASE_TABLET | Freq: Once | ORAL | Status: AC
  Administered 2023-06-16: 20 meq via ORAL
  Filled 2023-06-16: qty 1

## 2023-06-16 MED ORDER — FAMOTIDINE 20 MG PO TABS: 20.0000 mg | ORAL_TABLET | Freq: Two times a day (BID) | ORAL | Status: DC

## 2023-06-16 MED ORDER — DOCUSATE SODIUM 50 MG/5ML PO LIQD: 100.0000 mg | Freq: Two times a day (BID) | ORAL | Status: DC | PRN

## 2023-06-16 MED ORDER — THIAMINE MONONITRATE 100 MG PO TABS
100.0000 mg | ORAL_TABLET | Freq: Every day | ORAL | Status: DC
  Administered 2023-06-16 – 2023-06-19 (×4): 100 mg via ORAL
  Filled 2023-06-16 (×4): qty 1

## 2023-06-16 MED ORDER — FLUCONAZOLE 200 MG PO TABS
200.0000 mg | ORAL_TABLET | Freq: Every day | ORAL | Status: DC
  Administered 2023-06-17 – 2023-06-19 (×3): 200 mg via ORAL
  Filled 2023-06-16 (×3): qty 1

## 2023-06-16 MED ORDER — FUROSEMIDE 10 MG/ML IJ SOLN
40.0000 mg | Freq: Once | INTRAMUSCULAR | Status: AC
  Administered 2023-06-16: 40 mg via INTRAVENOUS
  Filled 2023-06-16: qty 4

## 2023-06-16 MED ORDER — ALBUTEROL SULFATE (2.5 MG/3ML) 0.083% IN NEBU
2.5000 mg | INHALATION_SOLUTION | RESPIRATORY_TRACT | Status: DC | PRN
  Administered 2023-06-18 – 2023-06-19 (×2): 2.5 mg via RESPIRATORY_TRACT
  Filled 2023-06-16 (×3): qty 3

## 2023-06-16 MED ORDER — HYDRALAZINE HCL 20 MG/ML IJ SOLN
10.0000 mg | INTRAMUSCULAR | Status: DC | PRN
  Administered 2023-06-16: 10 mg via INTRAVENOUS
  Filled 2023-06-16: qty 1

## 2023-06-16 NOTE — Progress Notes (Signed)
 eLink Physician-Brief Progress Note Patient Name: Johnny Rodriguez DOB: 08/27/1950 MRN: [id number]   Date of Service  06/16/2023  HPI/Events of Note  Hypertension 160/110  eICU Interventions  Hydralazine      Intervention Category Intermediate Interventions: Hypertension - evaluation and management  [name] 06/16/2023, 5:01 AM

## 2023-06-16 NOTE — Progress Notes (Signed)
 eLink Physician-Brief Progress Note Patient Name: Johnny Rodriguez DOB: [birth year] MRN: [id number]   Date of Service  06/16/2023  HPI/Events of Note  Atrial fib with RVR. QTC 441.  eICU Interventions  Amiodarone  ordered.        Shari Daughters Johnny Rodriguez 06/16/2023, 9:29 PM

## 2023-06-16 NOTE — TOC CM/SW Note (Signed)
 Transition of Care [facility]) - Inpatient Brief Assessment   Patient Details  Name: Johnny Rodriguez MRN: [id number] Date of Birth: 01-31-1951  Transition of Care [facility]) CM/SW Contact:    Tom-Johnson, Eila Runyan Daphne, RN Phone Number: 06/16/2023, 4:54 PM   Clinical Narrative:  Patient presented to the ED with Shortness of Breath. Patient was placed on BIPAP but failed and was intubated. Extubated today, on 3L O2. Has hx of COPD, chronic Respiratory Failure on 3 L O2 at baseline, Paroxysmal Atrial Fibrillation and PE on Eliquis . Continues on IV abx. CM went to bedside to assess patient, not Medically stable for assessment.    Patient not Medically ready for discharge.  CM will continue to follow as patient progresses with care towards discharge.       Transition of Care Asessment:

## 2023-06-16 NOTE — Progress Notes (Addendum)
 NAME:  Johnny Rodriguez, MRN:  [id number], DOB:  09/10/1950, LOS: 1 ADMISSION DATE:  06/15/2023, CONSULTATION DATE:  06/15/2023 REFERRING MD:  Gordon Latus - EDP, CHIEF COMPLAINT:  Respiratory failure    History of Present Illness:  Johnny Rodriguez is a 73 y.o. M with PMH significant for COPD on home O2, PE on Eliquis , HFrEF with EF 35-40%, HTN and chronic pain who presented to the ED after being discharged home from the hospital 5/4 with increased shortness of breath and was hypoxic to 50% on [place] on arrival.  He was placed on Bipap, given nebs but remained in distress with accessory muscle use and diaphoreses.  ABG showed pCo2 of 74, though compensated with a pH of 7.3, his lactic acid was 2.0 and WBC had increased to 23k from 13.6 the day before.  Given his level of respiratory distress PCCM was consulted and pt was intubated in the ED and admitted to the ICU  Pertinent Medical History:   has a past medical history of AF (atrial fibrillation) (HCC), Alcohol abuse, Angina pectoris, CAD (coronary artery disease) (12/15/2004), COPD (chronic obstructive pulmonary disease) (HCC), Coronary artery disease, Heavy cigarette smoker, HTN (hypertension), Idiopathic cardiomyopathy (HCC) (07/16/2009), Non compliance with medical treatment, Pulmonary embolism (HCC) (03/27/2022), and Seizures (HCC).  Significant Hospital Events: Including procedures, antibiotic start and stop dates in addition to other pertinent events   5/5 Presented one day after discharge hypoxic and hypercarbic and was intubated in the ED   Interim History / Subjective:  No significant events overnight Weaning intermittently on vent this AM, wakes up vigorously but then becomes drowsy and apneic quickly Precedex  weaned to off Repeat SBT Lasix  x 2 to optimize for extubation today Foley removal post-Lasix   Objective   Blood pressure (!) 154/110, pulse 90, temperature (!) 97.3 F (36.3 C), temperature source Axillary, resp. rate 19, weight 76.2 kg,  SpO2 98%.    Vent Mode: PSV;CPAP FiO2 (%):  [35 %-40 %] 35 % Set Rate:  [16 bmp-18 bmp] 16 bmp Vt Set:  [600 mL] 600 mL PEEP:  [5 cmH20] 5 cmH20 Plateau Pressure:  [17 cmH20-21 cmH20] 17 cmH20   Intake/Output Summary (Last 24 hours) at 06/16/2023 1254 Last data filed at 06/16/2023 1226 Gross per 24 hour  Intake 2318.2 ml  Output 1775 ml  Net 543.2 ml   Filed Weights   06/16/23 0419  Weight: 76.2 kg   Physical Examination: General: Acutely ill-appearing older man in NAD. HEENT: [place]/AT, anicteric sclera, PERRL, moist mucous membranes. Neuro:  Awake, nods appropriately to questions. Intermittently a bit agitated with ETT.  Responds to verbal stimuli. Following commands consistently. Moves all 4 extremities spontaneously. CV: RRR, no m/g/r. PULM: Breathing even and unlabored on vent (PSV 8/5, FiO2 35%). Lung fields CTAB in upper fields, fine crackles at bases R > L. GI: Soft, nontender, nondistended. Normoactive bowel sounds. Extremities: Bilateral symmetric 1+ LE edema noted. Skin: Warm/dry, no rashes.  Resolved Hospital Problem List:     Assessment & Plan:   Acute-on-chronic hypoxic and hypercarbic respiratory failure  Possible aspiration event vs COPD exacerbation  Intubated in the ED - Continue full vent support (4-8cc/kg IBW),weaning as able, goal extubation today 5/6 - Wean FiO2 for O2 sat > 90% - Daily WUA/SBT, plan for extubation today 5/6 - VAP bundle - Continue bronchodilators (Brovana /Yupelri , add short-acting) - IV Solumedrol, avoiding Pulmicort  due to oral thrush  - Lasix  to optimize for extubation - Pulmonary hygiene - PAD protocol for sedation: Precedex  for goal RASS  0 to -1, off currently - Resp Cx 4/30 with +PsA, pan-sensitive; BCx NGTD - Continue Zosyn   Hypotension with concern for sepsis versus sedation-related - Goal MAP > 65 - S/p fluid resuscitation, now moving toward volume overload in the setting of HFrEF - Not requiring vasopressors at present -  Trend WBC, fever curve; normalized - F/u Cx data - Continue broad-spectrum antibiotics (Zosyn  as above)  Hx of DVT/PE PAF HFrEF  Last Echo 05/2023 EF 35-40%. - Cardiac monitoring - Optimize electrolytes for K > 4, Mg > 2 - Eliquis  for [facility]  Hyperglycemia  - SSI - CBGs Q4H, then ACHS - Goal CBG 140-180  Oral thrush - Fluconazole x 14-day course given significant recent steroid use  Best Practice (right click and "Reselect all SmartList Selections" daily)   Diet/type: NPO DVT prophylaxis LMWH Pressure ulcer(s): N/A GI prophylaxis: H2B Lines: N/A Foley:  Yes, and it is still needed Code Status:  full code Last date of multidisciplinary goals of care discussion [pending]  Critical care time:    The patient is critically ill with multiple organ system failure and requires high complexity decision making for assessment and support, frequent evaluation and titration of therapies, advanced monitoring, review of radiographic studies and interpretation of complex data.   Critical Care Time devoted to patient care services, exclusive of separately billable procedures, described in this note is 38 minutes.  Star [place], PA-C [place] [facility] 06/16/23 1:31 PM  Please see [email or web address] for pager details.  From 7A-7P if no response, please call [phone] After hours, please call ELink [phone]

## 2023-06-16 NOTE — Procedures (Signed)
 Extubation Procedure Note  Patient Details:   Name: Johnny Rodriguez DOB: 1951-01-03 MRN: [id number]   Airway Documentation:    Vent end date: 06/16/23 Vent end time: 1421   Evaluation  O2 sats: stable throughout Complications: No apparent complications Patient did tolerate procedure well. Bilateral Breath Sounds: Clear, Diminished   Yes  Pt extubated per MD order, placed on 3L [place]. Positive cuff leak noted, no stridor heard. Vitals stable throughout, RT will continue to monitor.   Morrell Aran 06/16/2023, 2:24 PM

## 2023-06-17 DIAGNOSIS — J9621 Acute and chronic respiratory failure with hypoxia: Secondary | ICD-10-CM | POA: Diagnosis not present

## 2023-06-17 DIAGNOSIS — J9622 Acute and chronic respiratory failure with hypercapnia: Secondary | ICD-10-CM | POA: Diagnosis not present

## 2023-06-17 DIAGNOSIS — I10 Essential (primary) hypertension: Secondary | ICD-10-CM

## 2023-06-17 DIAGNOSIS — I4891 Unspecified atrial fibrillation: Secondary | ICD-10-CM

## 2023-06-17 DIAGNOSIS — R739 Hyperglycemia, unspecified: Secondary | ICD-10-CM | POA: Diagnosis not present

## 2023-06-17 LAB — BASIC METABOLIC PANEL WITH GFR
Anion gap: 12 (ref 5–15)
BUN: 33 mg/dL — ABNORMAL HIGH (ref 8–23)
CO2: 33 mmol/L — ABNORMAL HIGH (ref 22–32)
Calcium: 8.9 mg/dL (ref 8.9–10.3)
Chloride: 93 mmol/L — ABNORMAL LOW (ref 98–111)
Creatinine, Ser: 1.27 mg/dL — ABNORMAL HIGH (ref 0.61–1.24)
GFR, Estimated: >60 mL/min (ref >60)
Glucose, Bld: 205 mg/dL — ABNORMAL HIGH (ref 70–99)
Potassium: 4 mmol/L (ref 3.5–5.1)
Sodium: 138 mmol/L (ref 135–145)

## 2023-06-17 LAB — MAGNESIUM: Magnesium: 2.2 mg/dL (ref 1.7–2.4)

## 2023-06-17 LAB — GLUCOSE, CAPILLARY
Glucose-Capillary: 194 mg/dL — ABNORMAL HIGH (ref 70–99)
Glucose-Capillary: 187 mg/dL — ABNORMAL HIGH (ref 70–99)
Glucose-Capillary: 251 mg/dL — ABNORMAL HIGH (ref 70–99)
Glucose-Capillary: 259 mg/dL — ABNORMAL HIGH (ref 70–99)
Glucose-Capillary: 235 mg/dL — ABNORMAL HIGH (ref 70–99)

## 2023-06-17 MED ORDER — BISOPROLOL FUMARATE 5 MG PO TABS
5.0000 mg | ORAL_TABLET | Freq: Every day | ORAL | Status: DC
  Administered 2023-06-17 – 2023-06-19 (×3): 5 mg via ORAL
  Filled 2023-06-17 (×3): qty 1

## 2023-06-17 MED ORDER — PREDNISONE 20 MG PO TABS: 40.0000 mg | ORAL_TABLET | Freq: Every day | ORAL | Status: DC

## 2023-06-17 MED ORDER — PREDNISONE 20 MG PO TABS
40.0000 mg | ORAL_TABLET | Freq: Every day | ORAL | Status: DC
  Administered 2023-06-17 – 2023-06-19 (×3): 40 mg via ORAL
  Filled 2023-06-17 (×3): qty 2

## 2023-06-17 MED ORDER — POLYETHYLENE GLYCOL 3350 17 G PO PACK: 17.0000 g | PACK | Freq: Every day | ORAL | Status: DC | PRN

## 2023-06-17 MED ORDER — SALINE SPRAY 0.65 % NA SOLN: 1.0000 | NASAL | Status: DC | PRN

## 2023-06-17 MED ORDER — METOPROLOL SUCCINATE ER 50 MG PO TB24
75.0000 mg | ORAL_TABLET | Freq: Every day | ORAL | Status: DC
  Filled 2023-06-17: qty 1

## 2023-06-17 NOTE — Progress Notes (Signed)
 PT Cancellation Note  Patient Details Name: Justine Halliday MRN: [id number] DOB: 04-27-1950   Cancelled Treatment:    Reason Eval/Treat Not Completed: Medical issues which prohibited therapy (HR to 150's with meds in place.  Nurse asked PT to HOLD today.  Will check back at later date.)   Florencia Hunter 06/17/2023, 10:49 AM Torria Fromer M,PT Acute Rehab Services [phone]

## 2023-06-17 NOTE — Progress Notes (Signed)
 OT Cancellation Note  Patient Details Name: Johnny Rodriguez MRN: [id number] DOB: Mar 03, 1950   Cancelled Treatment:    Reason Eval/Treat Not Completed: Medical issues which prohibited therapy (HR to 150's with meds in place.  Nurse asked therapies to HOLD today.  Will check back at later date)  Bartley Vuolo K, OTD, OTR/L [employer] [facility]erred Acute Rehab [phone]   Johnny Rodriguez 06/17/2023, 11:17 AM

## 2023-06-17 NOTE — Progress Notes (Addendum)
 Acute on Chronic Systolic Heart Failure (present on admission)  Lactic acidosis due to hypoxia and work of breathing. No sepsis

## 2023-06-17 NOTE — Progress Notes (Signed)
 NAME:  Johnny Rodriguez, MRN:  [id number], DOB:  December 02, 1950, LOS: 2 ADMISSION DATE:  06/15/2023, CONSULTATION DATE:  06/15/2023 REFERRING MD:  Gordon Latus - EDP, CHIEF COMPLAINT:  Respiratory failure    History of Present Illness:  73 year old man with PMHx significant for COPD on home O2, PE on Eliquis , HFrEF with EF 35-40%, HTN and chronic pain who presented to the ED after being discharged home from the hospital 5/4 with increased shortness of breath and was hypoxic to 50% on [place] on arrival.  He was placed on Bipap, given nebs but remained in distress with accessory muscle use and diaphoreses.  ABG showed pCo2 of 74, though compensated with a pH of 7.3, his lactic acid was 2.0 and WBC had increased to 23k from 13.6 the day before.  Given his level of respiratory distress PCCM was consulted and pt was intubated in the ED and admitted to the ICU.  Pertinent Medical History:   has a past medical history of AF (atrial fibrillation) (HCC), Alcohol abuse, Angina pectoris, CAD (coronary artery disease) (12/15/2004), COPD (chronic obstructive pulmonary disease) (HCC), Coronary artery disease, Heavy cigarette smoker, HTN (hypertension), Idiopathic cardiomyopathy (HCC) (07/16/2009), Non compliance with medical treatment, Pulmonary embolism (HCC) (03/27/2022), and Seizures (HCC).  Significant Hospital Events: Including procedures, antibiotic start and stop dates in addition to other pertinent events   5/5 Presented one day after discharge hypoxic and hypercarbic and was intubated in the ED. 5/6 Extubated on Precedex . Afib RVR overnight.  Interim History / Subjective:  Extubated yesterday, tolerated well overall Went into Afib with RVR overnight, amio bolus + infusion Ongoing discussion re: safe discharge plan, patient unfortunately does not have great support at home, concern for EtOH abuse and no family locally/in chart, does have a friend listed in chart but unclear how much assistance this friend can  provide  Objective:  Blood pressure 105/75, pulse (!) 103, temperature (!) 96.2 F (35.7 C), temperature source Axillary, resp. rate (!) 26, weight 76 kg, SpO2 93%.    Vent Mode: PSV;CPAP FiO2 (%):  [35 %-36 %] 36 % Set Rate:  [16 bmp] 16 bmp Vt Set:  [600 mL] 600 mL PEEP:  [5 cmH20] 5 cmH20   Intake/Output Summary (Last 24 hours) at 06/17/2023 0946 Last data filed at 06/17/2023 0800 Gross per 24 hour  Intake 526.36 ml  Output 3648 ml  Net -3121.64 ml   Filed Weights   06/16/23 0419 06/17/23 0500  Weight: 76.2 kg 76 kg   Physical Examination: General: Chronically ill-appearing older man in NAD. Intermittently agitated but is redirectable. HEENT: [place]/AT, anicteric sclera, PERRL, moist mucous membranes. Scattered white  Neuro: Awake, oriented x 3-4 (intermittently a little confused on events leading to admission). Responds to verbal stimuli. Following commands consistently. Moves all 4 extremities spontaneously.  CV: Irregularly irregular rhythm, rate 110s-120s, no m/g/r. PULM: Breathing even and unlabored on 3LNC (home equivalent). Lung fields CTAB in upper fields, diminished at bases. GI: Soft, nontender, nondistended. Normoactive bowel sounds. Extremities: No significant LE edema noted. Skin: Warm/dry, no rashes.  Resolved Hospital Problem List:   Hypotension with concern for sepsis versus sedation-related  Assessment & Plan:   Acute-on-chronic hypoxic and hypercarbic respiratory failure  Possible aspiration event vs COPD exacerbation  Intubated in the ED. Extubated 5/6PM. - Supplemental O2 support for SpO2 88-92% - On home O2 3LNC at baseline - Bronchodilators (Brovana /Yupelri , DuoNebs PRN); no Pulmicort  due to oral thrush - IV Solumedrol, taper as indicated - No further diuresis, monitor I&Os -  VAP bundle - Pulmonary hygiene - Trend WBC, fever curve - Resp Cx 4/30 with +PsA, pan-sensitive; BCx NGTD - Continue Zosyn   Afib with RVR HFrEF  Hx of DVT/PE Last Echo  05/2023 EF 35-40%. Chronic Afib, new RVR 5/6PM. - Cardiology consulted, appreciate recommendations - Amiodarone  for rate control - Eliquis  for AC - Optimize electrolyes (K > 4, Mg > 2) - Goal MAP > 65, not on pressors - Cardiac monitoring  Hyperglycemia  - SSI - CBGs Q4H - Goal CBG 140-180  Oral thrush - Fluconazole x 14-day course with recent steroid use  Best Practice (right click and "Reselect all SmartList Selections" daily)   Diet/type: NPO DVT prophylaxis LMWH Pressure ulcer(s): N/A GI prophylaxis: H2B Lines: N/A Foley:  Yes, and it is still needed Code Status:  full code Last date of multidisciplinary goals of care discussion [pending]  Critical care time: N/A   Genoveva Kidney [facility] 06/17/23 9:46 AM  Please see [email or web address] for pager details.  From 7A-7P if no response, please call [phone] After hours, please call ELink [phone]

## 2023-06-17 NOTE — Progress Notes (Signed)
 Pt refuses to take insulin  and keeps stating, "I am not a diabetic". I have explained and educated the patient on the importance of managing blood glucose levels, especially while on steroids, however patient still refuses.

## 2023-06-17 NOTE — Consult Note (Addendum)
 Cardiology Consult    Patient ID: Kiyoshi Scherf MRN: [id number], DOB/AGE: [id number]   Admit date: 06/15/2023 Date of Consult: 06/17/2023  Primary Physician: Vicente Graham, No Primary Cardiologist: None Requesting Provider: Madelynn Schilder  Patient Profile    Tyquavious Pigue is a 73 y.o. male with a history of COPD,  chronic hypoxic respiratory failure 3 L oxygen  at baseline, PE on Eliquis , HFrEF with EF 35-40%, HTN and chronic pain, who is being seen today for the evaluation of Atrial fibrillation with RVR at the request of Dr. Marene Shape.   History of Present Illness    This is a 73 year old male with past medical history of atrial fibrillation (2005), HFrEF, HTN, PE, tobacco use, chronic respiratory failure with hypoxia on 3 L oxygen  at home, COPD, who underwent insertion of an implantable loop recorder years ago and was found to have atrial fibrillation, was placed on systemic anticoagulation.  The loop recorder was removed on 08/24/2015. He was last seen by cardiologist on 02/027/2025 and was noted to be in AF.   Of note, patient was recently admitted on 4/30 for SOB,  he was briefly placed on BiPAP, failed quickly and was intubated in the ED. He was extubated on 5/1 and eventually was discharged home 5/4 in the afternoon. He had worsening dyspnea overnight and returned back in ED 5/5, was intubated in the ED due to respiratory distress, extubated 5/6.   He was noted to be in atrial fibrillation overnight on 5/6 and was started on Amiodarone  gtt.   Presently, patient is laying in bed with shallow breathing, denies any chest pain, shortness of breath, orthopnea. Does note LE swelling since this morning.   Past Medical History   Past Medical History:  Diagnosis Date   AF (atrial fibrillation) (HCC)    Alcohol abuse    Angina pectoris    CAD (coronary artery disease) 12/15/2004   COPD (chronic obstructive pulmonary disease) (HCC)    Coronary artery disease    Heavy cigarette smoker     HTN (hypertension)    Idiopathic cardiomyopathy (HCC) 07/16/2009   Non compliance with medical treatment    Pulmonary embolism (HCC) 03/27/2022   Seizures (HCC)     Past Surgical History:  Procedure Laterality Date   CARDIAC CATHETERIZATION  2006   CARDIAC CATHETERIZATION  12/2004   Left    CARDIOVERSION  01/21/2011   Procedure: CARDIOVERSION;  Surgeon: Elaina Graver., MD;  Location[facility] OR;  Service: Cardiovascular;  Laterality: N/A;   DOPPLER ECHOCARDIOGRAPHY  02/2011   ELECTROPHYSIOLOGY STUDY N/A 12/16/2011   Procedure: ELECTROPHYSIOLOGY STUDY;  Surgeon: Tammie Fall, MD;  Location: [facility] CATH LAB;  Service: Cardiovascular;  Laterality: N/A;   EP IMPLANTABLE DEVICE N/A 08/24/2015   Procedure: Loop Recorder Removal;  Surgeon: Tammie Fall, MD;  Location: [facility] INVASIVE CV LAB;  Service: Cardiovascular;  Laterality: N/A;   fx collar bone     NO PAST SURGERIES       Allergies:   No Known Allergies  Inpatient Medications     apixaban   5 mg Oral BID   arformoterol   15 mcg Nebulization BID   Chlorhexidine  Gluconate Cloth  6 each Topical Daily   fluconazole  200 mg Oral Daily   folic acid   1 mg Oral Daily   insulin  aspart  0-15 Units Subcutaneous Q4H   multivitamin with minerals  1 tablet Oral Daily   predniSONE   40 mg Oral Q breakfast   revefenacin   175  mcg Nebulization Daily   thiamine   100 mg Oral Daily   Or   thiamine   100 mg Intravenous Daily    Family History    Family History  Problem Relation Age of Onset   Suicidality Sister    CVA Mother    Stroke Mother    Heart disease Father    Coronary artery disease Father    COPD Father    He indicated that his mother is deceased. He indicated that his father is deceased. He indicated that two of his three sisters are alive. He indicated that his brother is alive.   Social History    Social History   Socioeconomic History   Marital status: Single    Spouse name: Not on file   Number of children: Not on  file   Years of education: Not on file   Highest education level: Not on file  Occupational History   Occupation: Production manager: UNEMPLOYED  Tobacco Use   Smoking status: Every Day    Current packs/day: 1.00    Average packs/day: 1 pack/day for 58.0 years (58.0 ttl pk-yrs)    Types: Cigarettes   Smokeless tobacco: Never   Tobacco comments:    pt states he is down to 1ppd 09/20/2020  Vaping Use   Vaping status: Never Used  Substance and Sexual Activity   Alcohol use: Yes    Comment: Drinks occasionally - not everyday   Drug use: Yes    Types: Marijuana   Sexual activity: Not on file  Other Topics Concern   Not on file  Social History Narrative   ** Merged History Encounter **       Social Drivers of Health   Financial Resource Strain: Low Risk  (11/21/2022)   Overall Financial Resource Strain (CARDIA)    Difficulty of Paying Living Expenses: Not hard at all  Food Insecurity: Patient Unable To Answer (06/15/2023)   Hunger Vital Sign    Worried About Running Out of Food in the Last Year: Patient unable to answer    Ran Out of Food in the Last Year: Patient unable to answer  Transportation Needs: Patient Unable To Answer (06/15/2023)   PRAPARE - Transportation    Lack of Transportation (Medical): Patient unable to answer    Lack of Transportation (Non-Medical): Patient unable to answer  Recent Concern: Transportation Needs - Unmet Transportation Needs (05/18/2023)   PRAPARE - Administrator, Civil Service (Medical): Yes    Lack of Transportation (Non-Medical): No  Physical Activity: Inactive (11/21/2022)   Exercise Vital Sign    Days of Exercise per Week: 0 days    Minutes of Exercise per Session: 0 min  Stress: No Stress Concern Present (11/21/2022)   [employer] of Occupational Health - Occupational Stress Questionnaire    Feeling of Stress : Only a little  Social Connections: Patient Unable To Answer (06/15/2023)   Social Connection and  Isolation Panel [NHANES]    Frequency of Communication with Friends and Family: Patient unable to answer    Frequency of Social Gatherings with Friends and Family: Patient unable to answer    Attends Religious Services: Patient unable to answer    Active Member of Clubs or Organizations: Patient unable to answer    Attends Banker Meetings: Patient unable to answer    Marital Status: Patient unable to answer  Recent Concern: Social Connections - Socially Isolated (05/18/2023)   Social Connection and Isolation Panel [NHANES]  Frequency of Communication with Friends and Family: Never    Frequency of Social Gatherings with Friends and Family: Never    Attends Religious Services: Never    Database administrator or Organizations: No    Attends Banker Meetings: Never    Marital Status: Never married  Intimate Partner Violence: Patient Unable To Answer (06/15/2023)   Humiliation, Afraid, Rape, and Kick questionnaire    Fear of Current or Ex-Partner: Patient unable to answer    Emotionally Abused: Patient unable to answer    Physically Abused: Patient unable to answer    Sexually Abused: Patient unable to answer     Review of Systems    ROS as per HPI   Physical Exam    Blood pressure 131/77, pulse (!) 133, temperature 98.5 F (36.9 C), temperature source Oral, resp. rate (!) 26, weight 76 kg, SpO2 93%.   General: 73 y.o. male laying in bed, appears in mild respiratory distress. Pleasant and cooperative. HEENT: Normal  Neck: Supple. No carotid bruits or JVD appreciated. Lungs: diffuse exp wheeze and decrease breath sounds  Heart: irregularly irregular rhythm noted on telemetry, tachycardiac  Abdomen: Soft, non-distended, and non-tender to palpation. Bowel sounds present in all 4 quadrants.   Extremities: + bilateral Lower extremity edema L > R Neuro: Alert and oriented x3. No focal deficits. Moves all extremities spontaneously. Psych: Normal affect.  Labs     Troponin (Point of Care Test) No results for input(s): "TROPIPOC" in the last 72 hours. No results for input(s): "CKTOTAL", "CKMB", "TROPONINI" in the last 72 hours. Lab Results  Component Value Date   WBC 13.0 (H) 06/16/2023   HGB 13.0 06/16/2023   HCT 39.5 06/16/2023   MCV 96.6 06/16/2023   PLT 196 06/16/2023    Recent Labs  Lab 06/13/23 0304 06/13/23 1322 06/17/23 0715  NA 140   < > 138  K 5.2*   < > 4.0  CL 93*   < > 93*  CO2 40*   < > 33*  BUN 35*   < > 33*  CREATININE 0.84   < > 1.27*  CALCIUM  8.8*   < > 8.9  PROT 5.5*  --   --   BILITOT 0.5  --   --   ALKPHOS 45  --   --   ALT 23  --   --   AST 13*  --   --   GLUCOSE 145*   < > 205*   < > = values in this interval not displayed.   Lab Results  Component Value Date   CHOL 161 08/15/2022   HDL 49 08/15/2022   LDLCALC 87 08/15/2022   TRIG 124 06/11/2023   Lab Results  Component Value Date   DDIMER <0.27 12/06/2022     Radiology Studies    DG CHEST PORT 1 VIEW Result Date: 06/15/2023 CLINICAL DATA:  Intubated. EXAM: PORTABLE CHEST 1 VIEW COMPARISON:  Earlier today. FINDINGS: The endotracheal tube tip is 5.3 cm above the carina, previously 8.7 cm above the carina. Nasogastric tube extending into the stomach with its tip and side hole not included. Normal sized heart. Tortuous and partially calcified thoracic aorta. The lungs remain hyperexpanded with progressive prominence of the pulmonary vasculature. No airspace consolidation. Minimal bilateral pleural fluid. Unremarkable bones. IMPRESSION: 1. Endotracheal tube tip 5.3 cm above the carina, previously 8.7 cm above the carina. 2. Progressive pulmonary vascular congestion. 3. Minimal bilateral pleural fluid. 4. COPD. Electronically Signed  By: Catherin Closs M.D.   On: 06/15/2023 14:35   DG Abd Portable 1V Result Date: 06/15/2023 CLINICAL DATA:  NG tube placement EXAM: PORTABLE ABDOMEN - 1 VIEW COMPARISON:  June 10, 2023 FINDINGS: the tip of the nasogastric tube is  located within the body of stomach in good position. IMPRESSION: Nasogastric tube in good position. Electronically Signed   By: Fredrich Jefferson M.D.   On: 06/15/2023 14:34   DG Chest Portable 1 View Result Date: 06/15/2023 CLINICAL DATA:  Intubation. EXAM: PORTABLE CHEST 1 VIEW COMPARISON:  Chest radiograph dated 06/15/2023 FINDINGS: Endotracheal tube approximately 8 cm above the carina. Enteric tube extends below diaphragm with tip beyond the inferior margin of the image. There is cardiomegaly with mild vascular congestion. Bibasilar atelectasis. No large pleural effusion. No pneumothorax. Atherosclerotic calcification of the aorta. No acute osseous pathology. IMPRESSION: 1. Endotracheal tube approximately 8 cm above the carina. 2. Cardiomegaly with mild vascular congestion. Electronically Signed   By: Angus Bark M.D.   On: 06/15/2023 13:13   DG Chest Portable 1 View Result Date: 06/15/2023 CLINICAL DATA:  Shortness of breath.  Hypoxia. EXAM: PORTABLE CHEST 1 VIEW COMPARISON:  06/10/2023 FINDINGS: Heart size near the upper limit of normal. Hyperexpanded lungs. Mildly progressive pulmonary vascular congestion and accentuation of the interstitial markings. Interval small left pleural effusion. Mild bilateral glenohumeral degenerative changes and marked left AC joint degenerative changes. IMPRESSION: Mild acute CHF superimposed on COPD. Electronically Signed   By: Catherin Closs M.D.   On: 06/15/2023 11:53   DG Abdomen 1 View Result Date: 06/10/2023 EXAM: 1 VIEW XRAY OF THE ABDOMEN SUPINE 06/10/2023 02:32:00 AM COMPARISON: None available. CLINICAL HISTORY: OG and ET tube. Post intubation, OG tube position FINDINGS: BOWEL: The bowel gas pattern is nonspecific. No bowel obstruction. PERITONEUM AND SOFT TISSUES: No abnormal calcifications. BONES: No acute osseous abnormality. LINES AND TUBES: Enteric tube terminates in the proximal gastric body. IMPRESSION: 1. Enteric tube terminates in the proximal gastric  body. Electronically signed by: Zadie Herter MD 06/10/2023 02:37 AM EDT RP Workstation: [id number]   DG Chest Portable 1 View Result Date: 06/10/2023 EXAM: 1 VIEW XRAY OF THE CHEST 06/10/2023 02:31:00 AM COMPARISON: Earlier today. CLINICAL HISTORY: OG and ET tube. Post intubation, ETT position FINDINGS: LUNGS AND PLEURA: Mild bibasilar atelectasis. No consolidation. No pulmonary edema. No pleural effusion. No pneumothorax. HEART AND MEDIASTINUM: No acute abnormality of the cardiac and mediastinal silhouettes. Thoracic aortic atherosclerosis. BONES AND SOFT TISSUES: No acute osseous abnormality. LINES AND TUBES: The endotracheal tube terminates 9.5 cm above the carina, in appropriate position. The enteric tube terminates in the gastric cardia, in appropriate position. IMPRESSION: 1. Endotracheal tube and enteric tube in appropriate positions. 2. Mild bibasilar atelectasis. Electronically signed by: Zadie Herter MD 06/10/2023 02:36 AM EDT RP Workstation: [id number]   DG Chest Port 1 View Result Date: 06/10/2023 CLINICAL DATA:  Dyspnea EXAM: PORTABLE CHEST 1 VIEW COMPARISON:  None Available. FINDINGS: The lungs are symmetrically hyperinflated in keeping with changes of underlying COPD. The lungs are clear. No pneumothorax or pleural effusion. Cardiac size is within normal limits. Pulmonary vascularity is normal. Osseous structures are age appropriate. Multiple healed left rib fractures are noted. IMPRESSION: 1. No active disease. COPD. Electronically Signed   By: Worthy Heads M.D.   On: 06/10/2023 01:44   DG Chest Portable 1 View Result Date: 06/04/2023 CLINICAL DATA:  Shortness of breath. EXAM: PORTABLE CHEST 1 VIEW COMPARISON:  May 18, 2023 FINDINGS: The heart size and mediastinal contours  are within normal limits. There is mild to moderate severity calcification of the aortic arch. The lungs are hyperinflated. There is no evidence of acute infiltrate. A very small right pleural effusion is  suspected. No pneumothorax is identified. Multilevel degenerative changes seen throughout the thoracic spine. IMPRESSION: Emphysematous lung disease with a very small, stable right pleural effusion. Electronically Signed   By: Virgle Grime M.D.   On: 06/04/2023 00:21   ECHOCARDIOGRAM LIMITED BUBBLE STUDY Result Date: 05/19/2023    ECHOCARDIOGRAM LIMITED REPORT   Patient Name:   KHYLIL DEGNER Fizer Date of Exam: 05/19/2023 Medical Rec #:  [id number]          Height:       71.0 in Accession #:    [id number]         Weight:       160.0 lb Date of Birth:  07/05/1950          BSA:          1.918 m Patient Age:    73 years           BP:           112/66 mmHg Patient Gender: M                  HR:           92 bpm. Exam Location:  Inpatient Procedure: Limited Echo, Cardiac Doppler, Color Doppler, Intracardiac            Opacification Agent and Saline Contrast Bubble Study (Both Spectral            and Color Flow Doppler were utilized during procedure). Indications:    I50.40* Unspecified combined systolic (congestive) and diastolic                 (congestive) heart failure  History:        Patient has prior history of Echocardiogram examinations, most                 recent 04/30/2023. Cardiomyopathy and CHF, CAD, Abnormal ECG,                 Arrythmias:Atrial Fibrillation, Signs/Symptoms:Dyspnea,                 Shortness of Breath and Syncope; Risk Factors:Current Smoker.                 Pulmonary embolus.  Sonographer:    Raynelle Callow RDCS Referring Phys: [phone] EKTA V PATEL IMPRESSIONS  1. Left ventricular ejection fraction, by estimation, is 35 to 40%. The left ventricle has moderately decreased function. The left ventricle demonstrates global hypokinesis. There is mild concentric left ventricular hypertrophy. Left ventricular diastolic parameters are indeterminate.  2. The right ventricular size is mildly enlarged. There is mildly elevated pulmonary artery systolic pressure. The estimated right ventricular systolic  pressure is 38.5 mmHg.  3. Left atrial size was moderately dilated.  4. A small pericardial effusion is present. The pericardial effusion is anterior to the right ventricle. There is no evidence of cardiac tamponade.  5. The mitral valve is normal in structure. No evidence of mitral valve regurgitation. No evidence of mitral stenosis.  6. Aortic valve regurgitation is not visualized.  7. The inferior vena cava is dilated in size with >50% respiratory variability, suggesting right atrial pressure of 8 mmHg. Comparison(s): No significant change from prior study. Prior images reviewed side by side. FINDINGS  Left Ventricle: On non-contrast images  concern for left ventricular apical thrombus. This does not appear on contrast images. Left ventricular ejection fraction, by estimation, is 35 to 40%. The left ventricle has moderately decreased function. The left  ventricle demonstrates global hypokinesis. Definity  contrast agent was given IV to delineate the left ventricular endocardial borders. The left ventricular internal cavity size was normal in size. There is mild concentric left ventricular hypertrophy. Left ventricular diastolic parameters are indeterminate. Right Ventricle: The right ventricular size is mildly enlarged. There is mildly elevated pulmonary artery systolic pressure. The tricuspid regurgitant velocity is 2.76 m/s, and with an assumed right atrial pressure of 8 mmHg, the estimated right ventricular systolic pressure is 38.5 mmHg. Left Atrium: Left atrial size was moderately dilated. Pericardium: A small pericardial effusion is present. The pericardial effusion is anterior to the right ventricle. There is no evidence of cardiac tamponade. Mitral Valve: The mitral valve is normal in structure. No evidence of mitral valve stenosis. Tricuspid Valve: The tricuspid valve is normal in structure. Tricuspid valve regurgitation is mild . No evidence of tricuspid stenosis. Aortic Valve: Aortic valve regurgitation  is not visualized. Aorta: The aortic root and ascending aorta are structurally normal, with no evidence of dilitation. Venous: The inferior vena cava is dilated in size with greater than 50% respiratory variability, suggesting right atrial pressure of 8 mmHg. IAS/Shunts: Agitated saline contrast was given intravenously to evaluate for intracardiac shunting. Additional Comments: Spectral Doppler performed. Color Doppler performed.  LEFT VENTRICLE PLAX 2D LVIDd:         4.70 cm LVIDs:         4.20 cm LV PW:         1.20 cm LV IVS:        1.10 cm  LV Volumes (MOD) LV vol d, MOD A2C: 141.0 ml LV vol d, MOD A4C: 139.0 ml LV vol s, MOD A2C: 89.3 ml LV vol s, MOD A4C: 82.7 ml LV SV MOD A2C:     51.7 ml LV SV MOD A4C:     139.0 ml LV SV MOD BP:      54.9 ml RIGHT VENTRICLE         IVC TAPSE (M-mode): 2.2 cm  IVC diam: 2.20 cm RIGHT ATRIUM           Index RA Area:     25.10 cm RA Volume:   82.10 ml  42.81 ml/m   AORTA Ao Asc diam: 3.20 cm TRICUSPID VALVE TR Peak grad:   30.5 mmHg TR Vmax:        276.00 cm/s Gloriann Larger MD Electronically signed by Gloriann Larger MD Signature Date/Time: 05/19/2023/1:41:06 PM    Final     EKG     EKG 06/15/2023 EKG was personally reviewed and demonstrates: Afib with RVR 156   Telemetry: Telemetry was personally reviewed and demonstrates: Afib with HR 110s-120s  Cardiac Imaging    05/19/2023 - ECHO Bubble Study  IMPRESSIONS    1. Left ventricular ejection fraction, by estimation, is 35 to 40%. The  left ventricle has moderately decreased function. The left ventricle  demonstrates global hypokinesis. There is mild concentric left ventricular  hypertrophy. Left ventricular  diastolic parameters are indeterminate.   2. The right ventricular size is mildly enlarged. There is mildly  elevated pulmonary artery systolic pressure. The estimated right  ventricular systolic pressure is 38.5 mmHg.   3. Left atrial size was moderately dilated.   4. A small pericardial  effusion is present. The pericardial effusion is  anterior to the right ventricle. There is no evidence of cardiac  tamponade.   5. The mitral valve is normal in structure. No evidence of mitral valve  regurgitation. No evidence of mitral stenosis.   6. Aortic valve regurgitation is not visualized.   7. The inferior vena cava is dilated in size with >50% respiratory  variability, suggesting right atrial pressure of 8 mmHg.   Comparison(s): No significant change from prior study. Prior images  reviewed side by side.    Assessment & Plan    Atrial Fibrillation with Rapid ventricular rate Patient was noted to be in atrial fibrillation overnight 5/6, was started on Amiodarone  gtt per PCCM. EKG with Atrial fibrillation with ventricular rate 156. HR noted to be in 120s while patient was being evaluated at bedside. He has had multiple admissions due to AoC hypoxic and hypercarbic respiratory failure and COPD exacerbations. Last Echo on 05/19/2023 showed LVEF 35-40% with moderately decreased LV function, global hypokinesis, mild LVH, mildly elevated PASP. CHA2DS2-VASc Score = 4  [CHF History: 1, HTN History: 1, Diabetes History: 0, Stroke History: 0, Vascular Disease History: 1, Age Score: 1, Gender Score: 0]. Per evaluation, he is asymptomatic, denies any chest pain or palpitations. Will not recommend repeat ECHO at this time as underlying etiology for RVR more likely pulmonary disease.   - Check TSH  - Potassium 4.0 and Magnesium  2.2  - Continue Eliquis  5 mg BID for AC  - IV Amiodarone  is ok to continue short term only, will not continue as long term medication with underlying COPD. Patient will more likely stay in permanent Afib, will need to optimize rate control.  - Consider Bisoprolol as rate control medication with underlying COPD   HFrEF [EF 35-40%] Last Echo on 4/8 showed LVEF 35-40% with moderately decerased LV function, global hypokinesis, mild LVH, mildly elevated PASP. Chest X ray negative  for cardiomegaly and pulmonary congestion. Does have trace LE edema, but does not appear to be in exacerbation.   Prior History of PE Continue Eliquis  5 mg BID  Hypertension  Home medication consists of Metoprolol  Tartrate 37.5 mg BID. Blood pressure stable.   Otherwise, per primary  AoC H/H RF ECOPD  Signed, Roya Tawkaliyar, DO 06/17/2023, 3:59 PM Pager: [phone] For questions or updates, please contact     Please consult [email or web address] for contact info under Cardiology/STEMI.    Attending Note:   The patient was seen and examined.  Agree with assessment and plan as noted above.  Changes made to the above note as needed.  Patient seen and independently examined with Roya Tawkaliyar, DO.   We discussed all aspects of the encounter. I agree with the assessment and plan as stated above.   1.  Atrial fibrillation with rapid ventricular response.:  His atrial fibrillation is almost certainly related to his respiratory failure and severe COPD with exacerbations.  He has had multiple admissions over the past month.  He is continue to smoke and continues to drink.  I think the likelihood of him continuing to have COPD exacerbations is very high.  He has a known history of COPD going back for the past 20 years and is on Eliquis .  I suspect that over time his atrial fibrillation will worsen to the point that he has frequent episodes of A-fib and perhaps eventually longstanding persistent/chronic atrial fibrillation.  Amiodarone  is not a good long-term choice because of the risk of worsening his lung disease.  Diltiazem  is also not a good choice of medications  because of his moderately reduced left ventricular function.  I think that we should concentrate on rate control and anticoagulation instead of rhythm control. I think that he would benefit from Toprol -XL 75 mg a day or perhaps a trial of bisoprolol for rate control. He was on metoprolol  at home.  Toprol -XL will would be  beneficial given his moderate LV dysfunction.  Will start him on Toprol -XL 75 mg a day.  Continue Eliquis  5 mg twice a day.  I would not use amiodarone  as a long term medication for him given his severe COPD. We will hopefully be able to begin titrating his amiodarone  as his rate is better controlled after restarting the beta-blocker.      I have spent a total of 40 minutes with patient reviewing hospital  notes , telemetry, EKGs, labs and examining patient as well as establishing an assessment and plan that was discussed with the patient.  > 50% of time was spent in direct patient care.    [place], Marieta Shorten., MD, [facility] 06/17/2023, 4:07 PM [address]  [address] Office [phone] Pager [phone]

## 2023-06-18 DIAGNOSIS — I4891 Unspecified atrial fibrillation: Secondary | ICD-10-CM | POA: Diagnosis not present

## 2023-06-18 DIAGNOSIS — J9622 Acute and chronic respiratory failure with hypercapnia: Secondary | ICD-10-CM | POA: Diagnosis not present

## 2023-06-18 DIAGNOSIS — J9621 Acute and chronic respiratory failure with hypoxia: Secondary | ICD-10-CM | POA: Diagnosis not present

## 2023-06-18 DIAGNOSIS — R739 Hyperglycemia, unspecified: Secondary | ICD-10-CM | POA: Diagnosis not present

## 2023-06-18 LAB — CBC
HCT: 37.8 % — ABNORMAL LOW (ref 39.0–52.0)
Hemoglobin: 12.1 g/dL — ABNORMAL LOW (ref 13.0–17.0)
MCH: 32.1 pg (ref 26.0–34.0)
MCHC: 32 g/dL (ref 30.0–36.0)
MCV: 100.3 fL — ABNORMAL HIGH (ref 80.0–100.0)
Platelets: 209 10*3/uL (ref 150–400)
RBC: 3.77 MIL/uL — ABNORMAL LOW (ref 4.22–5.81)
RDW: 15.7 % — ABNORMAL HIGH (ref 11.5–15.5)
WBC: 12.3 10*3/uL — ABNORMAL HIGH (ref 4.0–10.5)
nRBC: 0 % (ref 0.0–0.2)

## 2023-06-18 LAB — BASIC METABOLIC PANEL WITH GFR
Anion gap: 6 (ref 5–15)
BUN: 26 mg/dL — ABNORMAL HIGH (ref 8–23)
CO2: 34 mmol/L — ABNORMAL HIGH (ref 22–32)
Calcium: 8.2 mg/dL — ABNORMAL LOW (ref 8.9–10.3)
Chloride: 95 mmol/L — ABNORMAL LOW (ref 98–111)
Creatinine, Ser: 0.71 mg/dL (ref 0.61–1.24)
GFR, Estimated: >60 mL/min (ref >60)
Glucose, Bld: 192 mg/dL — ABNORMAL HIGH (ref 70–99)
Potassium: 3.6 mmol/L (ref 3.5–5.1)
Sodium: 135 mmol/L (ref 135–145)

## 2023-06-18 LAB — T4, FREE: Free T4: 0.89 ng/dL (ref 0.61–1.12)

## 2023-06-18 LAB — MAGNESIUM: Magnesium: 2 mg/dL (ref 1.7–2.4)

## 2023-06-18 LAB — PHOSPHORUS: Phosphorus: 2.6 mg/dL (ref 2.5–4.6)

## 2023-06-18 LAB — GLUCOSE, CAPILLARY
Glucose-Capillary: 224 mg/dL — ABNORMAL HIGH (ref 70–99)
Glucose-Capillary: 191 mg/dL — ABNORMAL HIGH (ref 70–99)
Glucose-Capillary: 268 mg/dL — ABNORMAL HIGH (ref 70–99)
Glucose-Capillary: 235 mg/dL — ABNORMAL HIGH (ref 70–99)
Glucose-Capillary: 217 mg/dL — ABNORMAL HIGH (ref 70–99)

## 2023-06-18 LAB — TSH: TSH: 2.305 u[IU]/mL (ref 0.350–4.500)

## 2023-06-18 MED ORDER — FUROSEMIDE 10 MG/ML IJ SOLN: 40.0000 mg | Freq: Once | INTRAMUSCULAR | Status: DC

## 2023-06-18 MED ORDER — POTASSIUM CHLORIDE CRYS ER 20 MEQ PO TBCR
40.0000 meq | EXTENDED_RELEASE_TABLET | Freq: Once | ORAL | Status: AC
  Administered 2023-06-18: 40 meq via ORAL
  Filled 2023-06-18: qty 2

## 2023-06-18 MED ORDER — FUROSEMIDE 10 MG/ML IJ SOLN
40.0000 mg | Freq: Once | INTRAMUSCULAR | Status: AC
  Administered 2023-06-18: 40 mg via INTRAVENOUS
  Filled 2023-06-18: qty 4

## 2023-06-18 NOTE — Progress Notes (Addendum)
 Progress Note  Patient Name: Johnny Rodriguez Date of Encounter: 06/18/2023  Primary Cardiologist: None   Subjective   Patient is evaluated at bedside, laying in bed, no acute distress. Reports that he is doing well and wants to leave. Denies any chest pain, shortness of breath, or palpitations. Denies any dizziness or lightheadedness. No concerns at this time.   Telemetry reviewed at bedside with Irregularly irregular rhythm and HR 80s. Has IV Amiodarone  16.7 mL/hr while patient is being evaluated.   Inpatient Medications    Scheduled Meds:  apixaban   5 mg Oral BID   arformoterol   15 mcg Nebulization BID   bisoprolol  5 mg Oral Daily   Chlorhexidine  Gluconate Cloth  6 each Topical Daily   fluconazole  200 mg Oral Daily   folic acid   1 mg Oral Daily   multivitamin with minerals  1 tablet Oral Daily   predniSONE   40 mg Oral Q breakfast   revefenacin   175 mcg Nebulization Daily   thiamine   100 mg Oral Daily   Or   thiamine   100 mg Intravenous Daily   Continuous Infusions:  piperacillin -tazobactam (ZOSYN )  IV 12.5 mL/hr at 06/18/23 0900   PRN Meds: albuterol , docusate, hydrALAZINE , LORazepam  **OR** LORazepam , mouth rinse, polyethylene glycol, sodium chloride    Vital Signs    Vitals:   06/18/23 0700 06/18/23 0744 06/18/23 0800 06/18/23 0900  BP: 106/75  110/69 114/76  Pulse: 83  81 76  Resp: 18  18 (!) 21  Temp:  98.3 F (36.8 C)    TempSrc:  Oral    SpO2: 95%  93% (!) 88%  Weight:        Intake/Output Summary (Last 24 hours) at 06/18/2023 1050 Last data filed at 06/18/2023 0900 Gross per 24 hour  Intake 791.03 ml  Output 200 ml  Net 591.03 ml   Filed Weights   06/16/23 0419 06/17/23 0500 06/18/23 0500  Weight: 76.2 kg 76 kg 76 kg    Telemetry    Irregularly irregular rhythm with episodes of hypoxia - Personally Reviewed  ECG    No new EKG - Personally Reviewed  Physical Exam   GEN: Sitting in bed, on 3L, no acute distress Cardiac: Regular rate, HR  80s, irregularly irregular rhythm  Respiratory: decrease breath sounds overall lung fields, no wheezing  GI: Soft, nontender, non-distended  MS: Trace pitting edema bilateral LE  Neuro:  Nonfocal  Psych: Normal affect   Labs    Chemistry Recent Labs  Lab 06/13/23 0304 06/13/23 1322 06/16/23 1702 06/17/23 0715 06/18/23 0431  NA 140   < > 139 138 135  K 5.2*   < > 3.8 4.0 3.6  CL 93*   < > 95* 93* 95*  CO2 40*   < > 29 33* 34*  GLUCOSE 145*   < > 124* 205* 192*  BUN 35*   < > 27* 33* 26*  CREATININE 0.84   < > 0.95 1.27* 0.71  CALCIUM  8.8*   < > 9.0 8.9 8.2*  PROT 5.5*  --   --   --   --   ALBUMIN  2.9*  --   --   --   --   AST 13*  --   --   --   --   ALT 23  --   --   --   --   ALKPHOS 45  --   --   --   --   BILITOT 0.5  --   --   --   --  GFRNONAA >60   < > >60 >60 >60  ANIONGAP 7   < > 15 12 6    < > = values in this interval not displayed.     Hematology Recent Labs  Lab 06/15/23 1052 06/15/23 1058 06/15/23 1357 06/16/23 0747 06/18/23 0431  WBC 23.1*  --   --  13.0* 12.3*  RBC 4.69  --   --  4.09* 3.77*  HGB 14.8   < > 12.6* 13.0 12.1*  HCT 50.0   < > 37.0* 39.5 37.8*  MCV 106.6*  --   --  96.6 100.3*  MCH 31.6  --   --  31.8 32.1  MCHC 29.6*  --   --  32.9 32.0  RDW 15.5  --   --  15.3 15.7*  PLT 264  --   --  196 209   < > = values in this interval not displayed.    Cardiac EnzymesNo results for input(s): "TROPONINI" in the last 168 hours. No results for input(s): "TROPIPOC" in the last 168 hours.   BNP Recent Labs  Lab 06/13/23 0304 06/15/23 1045  BNP 208.9* 418.2*     DDimer No results for input(s): "DDIMER" in the last 168 hours.   Radiology    No results found.  Cardiac Studies   05/19/2023 - ECHO Bubble Study   IMPRESSIONS    1. Left ventricular ejection fraction, by estimation, is 35 to 40%. The  left ventricle has moderately decreased function. The left ventricle  demonstrates global hypokinesis. There is mild concentric left  ventricular  hypertrophy. Left ventricular  diastolic parameters are indeterminate.   2. The right ventricular size is mildly enlarged. There is mildly  elevated pulmonary artery systolic pressure. The estimated right  ventricular systolic pressure is 38.5 mmHg.   3. Left atrial size was moderately dilated.   4. A small pericardial effusion is present. The pericardial effusion is  anterior to the right ventricle. There is no evidence of cardiac  tamponade.   5. The mitral valve is normal in structure. No evidence of mitral valve  regurgitation. No evidence of mitral stenosis.   6. Aortic valve regurgitation is not visualized.   7. The inferior vena cava is dilated in size with >50% respiratory  variability, suggesting right atrial pressure of 8 mmHg.   Comparison(s): No significant change from prior study. Prior images  reviewed side by side.   Patient Profile     [name] is a 73 y.o. male with a history of COPD,  chronic hypoxic respiratory failure 3 L oxygen  at baseline, PE on Eliquis , HFrEF with EF 35-40%, HTN and chronic pain, who is evaluated for A fib with RVR.   Assessment & Plan    Atrial Fibrillation with Rapid ventricular rate Patient was noted to be in atrial fibrillation with RVR overnight 5/6, was started on Amiodarone  gtt per PCCM. EKG with Atrial fibrillation with ventricular rate 156. He has had multiple admissions due to AoC hypoxic and hypercarbic respiratory failure and COPD exacerbations. Last Echo on 05/19/2023 showed LVEF 35-40% with moderately decreased LV function, global hypokinesis, mild LVH, mildly elevated PASP. CHA2DS2-VASc Score = 4  [CHF History: 1, HTN History: 1, Diabetes History: 0, Stroke History: 0, Vascular Disease History: 1, Age Score: 1, Gender Score: 0]. Will not recommend repeat ECHO at this time as underlying etiology for RVR more likely uncontrolled pulmonary disease. IV Amiodarone  has been stopped this AM.  - TSH pending, Free T4 0.89  WNL  -  Potassium 3.6 and Magnesium  2.0  - Rates in 80s at bedside with irregularly irregular rhythm, asymptomatic.  - Continue Eliquis  5 mg BID for anticoagulation   - Continue Bisoprolol 5 mg daily as rate control medication with underlying COPD, titrate as needed goal HR < 110  - Agree with optimizing anticoagulation and rate control  - Agree with Amiodarone  and Diltiazem  as not a good long term medications with underlying COPD and CHF respectively  - Will need close follow up with Afib clinic upon discharge    HFrEF [EF 35-40%] Last Echo on 4/8 showed LVEF 35-40% with moderately decerased LV function, global hypokinesis, mild LVH, mildly elevated PASP. Chest X ray negative for cardiomegaly and pulmonary congestion. Does have trace LE edema, but does not appear to be in exacerbation. Blood pressure at goal.  - Smoking cessation counseling  - Alcohol cessation counseling - Will need to follow up OP cardiology for titration of GDMT    Prior History of PE Continue Eliquis  5 mg BID   Hypertension  Home medication consists of Metoprolol  Tartrate 37.5 mg BID. Blood pressure stable.  - Continue Bisoprolol 5 mg    Otherwise, per primary  AoC H/H RF ECOPD     [facility] will sign off.   Medication Recommendations:  Eliquis  5 mg BID, Bisoprolol 5 mg daily.  Other recommendations (labs, testing, etc):  BMP  Follow up as an outpatient: Afib clinic and cardiology         Signed, Lanney Pitts, DO  06/18/2023, 10:50 AM    Attending Note:   The patient was seen and examined.  Agree with assessment and plan as noted above.  Changes made to the above note as needed.  Patient seen and independently examined with  Roya Tawkaliyar, DO .   We discussed all aspects of the encounter. I agree with the assessment and plan as stated above.   1 .   Atrial fib:   related to his repeated episodes of respiratory failure .   HR is well controlled on Bisoprolol   Continue eliquis   Advised  smoking cessation, Cessation of ETOH   2. HTN:  fairly well controlled.   He is very stable from a cardiac standpoint  I suspect he could be discharged soon     I have spent a total of 40 minutes with patient reviewing hospital  notes , telemetry, EKGs, labs and examining patient as well as establishing an assessment and plan that was discussed with the patient.  > 50% of time was spent in direct patient care.    [place], Marieta Shorten., MD, [facility] 06/18/2023, 4:38 PM [address],  [address] Office [phone] Pager [phone]

## 2023-06-18 NOTE — Evaluation (Signed)
 Occupational Therapy Evaluation Patient Details Name: Johnny Rodriguez MRN: [id number] DOB: 01/30/1951 Today's Date: 06/18/2023   History of Present Illness   73 year old man admitted with SOB and intubated in the ED 4/30 and extubated 5/1. Pt with Acute/chronic hypoxic and hypercarbic respiratory failure; A-fib with RVR.  Pt with 5 inpatient admissions in the last 6 months. Recently discharged 5/4 and readmitted 5/5. Pt on 3L at baseline. PMH significant for a-fib on coumadin, alcohol use, COPD on 3L oxygen , heart failure EF 40%, PE, HTN, chronic pain.     Clinical Impressions Pt recently discharged 5/4 and readmitted 5/5 with respiratory failure. Pt lives alone and states he doesn't have anyone to help at DC. Pt either uses his rollator or wc in the home and his power chair in the community for errands. Pt seen on 3L and required multiple rest breaks to mobilize from bed to chair. On third attempt, he was able to stand and step pivot to chair with min A @ RW level with SpO2 desat into the high 70s; HR 120s with 3/4 DOE. Requires overall mod A for ADL tasks due to deficits listed below. "Lady I get short of breath just talking, stop asking me questions, I can't do no more". Given his fatigue and generalized weakness, feel Patient will benefit from continued inpatient follow up therapy, <3 to maximize functional level of independence as he is unsafe to DC home alone. Acute OT to follow. End of session, BP 116/82; SpO2 94; HR 86; RR 31.      If plan is discharge home, recommend the following:   A little help with walking and/or transfers;A little help with bathing/dressing/bathroom;Assistance with cooking/housework;Direct supervision/assist for medications management;Assist for transportation;Help with stairs or ramp for entrance     Functional Status Assessment   Patient has had a recent decline in their functional status and demonstrates the ability to make significant improvements in  function in a reasonable and predictable amount of time.     Equipment Recommendations   BSC/3in1     Recommendations for Other Services         Precautions/Restrictions   Precautions Precautions: Fall;Other (comment) (watch Os)     Mobility Bed Mobility Overal bed mobility: Needs Assistance Bed Mobility: Supine to Sit     Supine to sit: Supervision          Transfers Overall transfer level: Needs assistance Equipment used: Rolling walker (2 wheels) Transfers: Sit to/from Stand, Bed to chair/wheelchair/BSC Sit to Stand: Min assist     Step pivot transfers: Min assist     General transfer comment: Able to stand on 3rd attempt; uses momentum; poor control of descent to the chair      Balance Overall balance assessment: Needs assistance   Sitting balance-Leahy Scale: Fair     Standing balance support: Bilateral upper extremity supported Standing balance-Leahy Scale: Poor                             ADL either performed or assessed with clinical judgement   ADL Overall ADL's : Needs assistance/impaired Eating/Feeding: Modified independent   Grooming: Set up;Sitting   Upper Body Bathing: Set up;Sitting   Lower Body Bathing: Moderate assistance;Sit to/from stand   Upper Body Dressing : Minimal assistance   Lower Body Dressing: Moderate assistance;Sit to/from stand Lower Body Dressing Details (indicate cue type and reason): unable to reach feet due to his shoulder pain per pt  Toilet Transfer: Minimal assistance;Stand-pivot   Toileting- Architect and Hygiene: Total assistance (foley)       Functional mobility during ADLs: Minimal assistance;Rolling walker (2 wheels);Cueing for safety       Vision Baseline Vision/History: 1 Wears glasses Patient Visual Report: No change from baseline Vision Assessment?: Wears glasses for reading     Perception         Praxis         Pertinent Vitals/Pain Pain  Assessment Pain Assessment: No/denies pain     Extremity/Trunk Assessment Upper Extremity Assessment Upper Extremity Assessment: Generalized weakness;LUE deficits/detail LUE Deficits / Details: L shoulder pain; owuld not allow  assessment of arm pain; chronic. arm ROM is functional           Communication Communication Communication: No apparent difficulties   Cognition Arousal: Alert Behavior During Therapy: Agitated (at times) Cognition: No family/caregiver present to determine baseline, Cognition impaired     Awareness: Intellectual awareness intact, Online awareness impaired   Attention impairment (select first level of impairment): Selective attention Executive functioning impairment (select all impairments): Reasoning                   Following commands: Intact       Cueing  General Comments      Pt complaining about nursing try to give him insulin . States insulin  is what made him weak.   Exercises Exercises: Other exercises Other Exercises Other Exercises: attempted educaiton on pursed lip breathing   Shoulder Instructions      Home Living Family/patient expects to be discharged to:: Private residence Living Arrangements: Alone   Type of Home: Apartment Home Access: Level entry     Home Layout: One level     Bathroom Shower/Tub: Tub/shower unit;Walk-in shower   Bathroom Toilet: Standard Bathroom Accessibility: Yes How Accessible: Accessible via walker Home Equipment: Rolling Walker (2 wheels);Rollator (4 wheels);Wheelchair - power;Shower seat;Grab bars - tub/shower          Prior Functioning/Environment Prior Level of Function : Independent/Modified Independent             Mobility Comments: uses power wc and standard; has a rollator and uses that as well. Unsure exactly as pt getting agitated with questions ADLs Comments: reports he drives his powerchair to get his groceries; does his bahting and dressing    OT Problem List:  Decreased strength;Decreased activity tolerance;Impaired balance (sitting and/or standing);Decreased safety awareness;Cardiopulmonary status limiting activity   OT Treatment/Interventions: Self-care/ADL training;Therapeutic exercise;Energy conservation;DME and/or AE instruction;Therapeutic activities;Patient/family education;Balance training      OT Goals(Current goals can be found in the care plan section)   Acute Rehab OT Goals Patient Stated Goal: none stated OT Goal Formulation: With patient Time For Goal Achievement: 07/02/23 Potential to Achieve Goals: Fair   OT Frequency:  Min 2X/week    Co-evaluation              AM-PAC OT "6 Clicks" Daily Activity     Outcome Measure Help from another person eating meals?: None Help from another person taking care of personal grooming?: A Little Help from another person toileting, which includes using toliet, bedpan, or urinal?: Total Help from another person bathing (including washing, rinsing, drying)?: A Lot Help from another person to put on and taking off regular upper body clothing?: A Little Help from another person to put on and taking off regular lower body clothing?: A Lot 6 Click Score: 15   End of Session Equipment Utilized During Treatment: Gait  belt;Rolling walker (2 wheels);Oxygen  (3L) Nurse Communication: Mobility status;Other (comment) (desat into high 70s; HR in the 120s)  Activity Tolerance: Patient limited by fatigue Patient left: in chair;with call bell/phone within reach;with chair alarm set  OT Visit Diagnosis: Unsteadiness on feet (R26.81);Other abnormalities of gait and mobility (R26.89);Muscle weakness (generalized) (M62.81);Other symptoms and signs involving cognitive function                Time: 1027-2536 OT Time Calculation (min): 27 min Charges:  OT General Charges $OT Visit: 1 Visit OT Evaluation $OT Eval Moderate Complexity: 1 Mod OT Treatments $Self Care/Home Management : 8-22 mins  Milburn Aliment, OT/L   Acute OT Clinical Specialist Acute Rehabilitation Services Pager [phone] Office [phone]   [facility] 06/18/2023, 12:52 PM

## 2023-06-18 NOTE — TOC Initial Note (Signed)
 Transition of Care [facility]) - Initial/Assessment Note    Patient Details  Name: Johnny Rodriguez MRN: [id number] Date of Birth: 1950/11/26  Transition of [facility]) CM/SW Contact:    Carmon Christen, LCSWA Phone Number: 06/18/2023, 3:08 PM  Clinical Narrative:                  CSW received consult for possible SNF placement at time of discharge. CSW spoke with patient regarding PT recommendation of SNF placement at time of discharge. Patient reports PTA he comes from home alone. Patient expressed understanding of PT recommendation and politely declined SNF placement at time of discharge. Patient plans to return home when medically stable for dc.CSW informed patient CM will follow up for any home needs. Patient reports either his friend erica or billie will pick him up to transport home or he will go by bus. No further questions reported at this time. CSW to continue to follow and assist with discharge planning needs.        Expected Discharge Plan: Home/Self Care Barriers to Discharge: Continued Medical Work up   Patient Goals and CMS Choice Patient states their goals for this hospitalization and ongoing recovery are:: to return home   Choice offered to / list presented to : Patient      Expected Discharge Plan and Services In-house Referral: Clinical Social Work     Living arrangements for the past 2 months: Apartment                                      Prior Living Arrangements/Services Living arrangements for the past 2 months: Apartment Lives with:: Self Patient language and need for interpreter reviewed:: Yes Do you feel safe going back to the place where you live?: Yes      Need for Family Participation in Patient Care: Yes (Comment) Care giver support system in place?: Yes (comment)   Criminal Activity/Legal Involvement Pertinent to Current Situation/Hospitalization: No - Comment as needed  Activities of Daily Living      Permission  Sought/Granted Permission sought to share information with : Case Manager, Family Supports, Magazine features editor                Emotional Assessment   Attitude/Demeanor/Rapport: Gracious Affect (typically observed): Calm Orientation: : Oriented to Self, Oriented to Place, Oriented to  Time, Oriented to Situation Alcohol / Substance Use: Not Applicable Psych Involvement: No (comment)  Admission diagnosis:  COPD exacerbation (HCC) [J44.1] Acute on chronic respiratory failure (HCC) [J96.20] Sepsis, due to unspecified organism, unspecified whether acute organ dysfunction present [facility]) [A41.9] Patient Active Problem List   Diagnosis Date Noted   Acute respiratory failure with hypercapnia (HCC) 06/15/2023   Acute on chronic respiratory failure (HCC) 06/15/2023   Hyperkalemia 06/14/2023   Generalized weakness 06/12/2023   Acute hypoxic on chronic hypercapnic respiratory failure (HCC) 06/10/2023   History of pulmonary embolism 06/04/2023   Atrial fibrillation, chronic (HCC) 06/04/2023   Chronic pain syndrome 06/04/2023   Insomnia 06/04/2023   Thrombocytopenia (HCC) 06/04/2023   Protein-calorie malnutrition, moderate (HCC) 05/07/2023   Prediabetes 08/15/2022   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   Dyspnea secondary to COPD 03/26/2022   COPD with acute exacerbation (HCC) 03/11/2019   Chronic hypoxic respiratory failure (HCC) - chronically on 3 L/min continuously, baseline PCO2 around high 80s to low 90s on VBG 10/15/2016   Nicotine  dependence, cigarettes, uncomplicated 09/09/2016   Long term  current use of anticoagulant therapy 06/24/2016   History of syncope 06/24/2016   Acute on chronic respiratory failure with hypoxia and hypercapnia (HCC) 02/16/2016   Essential hypertension 02/08/2015   History of noncompliance with medical treatment 12/01/2011   Chronic combined systolic and diastolic congestive heart failure (HCC) 04/10/2011   Idiopathic cardiomyopathy (HCC) 07/16/2009    Paroxysmal atrial fibrillation with rapid ventricular response (HCC) 07/16/2009   COPD PFT's pending  07/16/2009   CAD (coronary artery disease) 12/15/2004   PCP:  Pcp, No Pharmacy:   [facility] - [place], [place] - [address] [place] [address] Phone: [phone] Fax: [phone]  [facility] [address] [place] [address] Phone: [phone] Fax: [phone]  [id number] [place], [place] - [address] [place] [address] Phone: [phone] Fax: [phone]  [facility] - [place], [place] - [address] [place] [phone] Phone: [phone] Fax: [phone]  [facility] - [address] ([place]), [place] - [address] [place] [address] Phone: [phone] Fax: [phone]     Social Drivers of Health (SDOH) Social History: SDOH Screenings   Food Insecurity: Patient Unable To Answer (06/15/2023)  Housing: Patient Unable To Answer (06/15/2023)  Transportation Needs: Patient Unable To Answer (06/15/2023)  Recent Concern: Transportation Needs - Unmet Transportation Needs (05/18/2023)  Utilities: Patient Unable To Answer (06/15/2023)  Alcohol Screen: Low Risk  (09/11/2020)  Recent Concern: Alcohol Screen - Medium Risk (08/07/2020)  Depression (PHQ2-9): Low Risk  (11/21/2022)  Financial Resource Strain: Low Risk  (11/21/2022)  Physical Activity: Inactive (11/21/2022)  Social Connections: Patient Unable To Answer (06/15/2023)  Recent Concern: Social Connections - Socially Isolated (05/18/2023)  Stress: No Stress Concern Present (11/21/2022)  Tobacco Use: High Risk (06/15/2023)  Health Literacy: Adequate Health Literacy (11/21/2022)   SDOH Interventions:     Readmission Risk Interventions    06/10/2023   12:46 PM  Readmission Risk Prevention Plan  Transportation  Screening Complete  PCP or Specialist Appt within 3-5 Days Complete  HRI or Home Care Consult Complete  Social Work Consult for Recovery Care Planning/Counseling Complete  Palliative Care Screening Not Applicable  Medication Review Oceanographer) Complete

## 2023-06-18 NOTE — Inpatient Diabetes Management (Signed)
 Inpatient Diabetes Program Recommendations  AACE/ADA: New Consensus Statement on Inpatient Glycemic Control (2015)  Target Ranges:  Prepandial:   less than 140 mg/dL      Peak postprandial:   less than 180 mg/dL (1-2 hours)      Critically ill patients:  140 - 180 mg/dL   Lab Results  Component Value Date   GLUCAP 191 (H) 06/18/2023   HGBA1C 6.1 (H) 05/06/2023    Review of Glycemic Control  Latest Reference Range & Units 06/17/23 07:49 06/17/23 11:43 06/17/23 15:35 06/17/23 19:34 06/17/23 22:57 06/18/23 03:30 06/18/23 07:43  Glucose-Capillary 70 - 99 mg/dL 782 (H) 956 (H) 213 (H) 259 (H) 235 (H) 224 (H) 191 (H)   Diabetes history: None  Current orders for Inpatient glycemic control:  None  PO Prednisone  40 mg Daily  Inpatient Diabetes Program Recommendations:    -   Start Novolog  0-9 units tid + hs  Thanks,  Eloise Hake RN, MSN, BC-ADM Inpatient Diabetes Coordinator Team Pager [phone] (8a-5p)

## 2023-06-18 NOTE — Progress Notes (Signed)
 Pharmacy Electrolyte Replacement  Recent Labs:  Recent Labs    06/18/23 0431  K 3.6  MG 2.0  PHOS 2.6  CREATININE 0.71    Low Critical Values (K </= 2.5, Phos </= 1, Mg </= 1) Present: K = 3.6 and None  MD Contacted: none  Plan: PO KCl 40meq x1  Harvest Lineman, PharmD PGY1 Pharmacy Resident

## 2023-06-18 NOTE — Progress Notes (Signed)
 NAME:  Johnny Rodriguez, MRN:  [id number], DOB:  02-Jun-1950, LOS: 3 ADMISSION DATE:  06/15/2023, CONSULTATION DATE:  06/15/2023 REFERRING MD:  Gordon Latus - EDP, CHIEF COMPLAINT:  Respiratory failure    History of Present Illness:  73 year old man with PMHx significant for COPD on home O2, PE on Eliquis , HFrEF with EF 35-40%, HTN and chronic pain who presented to the ED after being discharged home from the hospital 5/4 with increased shortness of breath and was hypoxic to 50% on [place] on arrival.  He was placed on Bipap, given nebs but remained in distress with accessory muscle use and diaphoreses.  ABG showed pCo2 of 74, though compensated with a pH of 7.3, his lactic acid was 2.0 and WBC had increased to 23k from 13.6 the day before.  Given his level of respiratory distress PCCM was consulted and pt was intubated in the ED and admitted to the ICU.  Pertinent Medical History:   has a past medical history of AF (atrial fibrillation) (HCC), Alcohol abuse, Angina pectoris, CAD (coronary artery disease) (12/15/2004), COPD (chronic obstructive pulmonary disease) (HCC), Coronary artery disease, Heavy cigarette smoker, HTN (hypertension), Idiopathic cardiomyopathy (HCC) (07/16/2009), Non compliance with medical treatment, Pulmonary embolism (HCC) (03/27/2022), and Seizures (HCC).  Significant Hospital Events: Including procedures, antibiotic start and stop dates in addition to other pertinent events   5/5 Presented one day after discharge hypoxic and hypercarbic and was intubated in the ED. 5/6 Extubated on Precedex . Afib RVR overnight. 5/8 stable, HR improved, stop amio gtt and transfer to the floor  Interim History / Subjective:  Remains stable and on [place]  D/c amiodarone  gtt as HR is better controlled  Wants to go home   Objective:  Blood pressure 116/79, pulse 85, temperature 98.3 F (36.8 C), temperature source Oral, resp. rate 19, weight 76 kg, SpO2 92%.        Intake/Output Summary (Last 24 hours)  at 06/18/2023 0911 Last data filed at 06/18/2023 0500 Gross per 24 hour  Intake 457.92 ml  Output --  Net 457.92 ml   Filed Weights   06/16/23 0419 06/17/23 0500 06/18/23 0500  Weight: 76.2 kg 76 kg 76 kg   Physical Examination: General: Chronically ill-appearing older man in NAD. HEENT: [place]/AT, anicteric sclera, PERRL, moist mucous membranes. Scattered white  Neuro: alert and oriented, following commands  CV: Irregularly irregular rhythm, rate 110s-120s, no m/g/r. PULM: Breathing even and unlabored on 3LNC (home equivalent). Lung fields CTAB in upper fields, diminished at bases. GI: Soft, nontender, nondistended. Normoactive bowel sounds. Extremities: No significant LE edema noted. Skin: Warm/dry, no rashes.  Resolved Hospital Problem List:   Hypotension with concern for sepsis versus sedation-related  Assessment & Plan:   Acute-on-chronic hypoxic and hypercarbic respiratory failure  Possible aspiration event vs COPD exacerbation  Intubated in the ED. Extubated 5/6PM. - Supplemental O2 support for SpO2 88-92% - On home O2 3LNC at baseline - Bronchodilators (Brovana /Yupelri , DuoNebs PRN); no Pulmicort  due to oral thrush - IV Solumedrol transitioned to oral prednisone   - Lasix  40mg   - VAP bundle - Pulmonary hygiene - Trend WBC, fever curve - Resp Cx 4/30 with +PsA, pan-sensitive; BCx NGTD - Continue Zosyn   Afib with RVR HFrEF  Hx of DVT/PE Last Echo 05/2023 EF 35-40%. Chronic Afib, new RVR 5/6PM. - Cardiology consulted, appreciate recommendations - Amiodarone  transitioned to bisprolol  - Eliquis  for [facility] - Optimize electrolyes (K > 4, Mg > 2) - Goal MAP > 65, not on pressors - Cardiac monitoring  Hyperglycemia  - SSI - CBGs Q4H - Goal CBG 140-180  Oral thrush - Fluconazole x 14-day course with recent steroid use  Best Practice (right click and "Reselect all SmartList Selections" daily)   Diet/type: Regular consistency (see orders) DVT prophylaxis LMWH Pressure  ulcer(s): N/A GI prophylaxis: H2B Lines: N/A Foley:  N/A Code Status:  full code Last date of multidisciplinary goals of care discussion [pending]  Critical care time: N/A   Patt Boozer Kyro Joswick, PA-C [facility] 06/18/23 9:11 AM  Please see [email or web address] for pager details.  From 7A-7P if no response, please call [phone] After hours, please call ELink [phone]

## 2023-06-18 NOTE — Evaluation (Signed)
 Physical Therapy Evaluation Patient Details Name: Johnny Rodriguez MRN: [id number] DOB: 19-Aug-1950 Today's Date: 06/18/2023  History of Present Illness  73 year old man admitted with SOB and intubated in the ED 4/30 and extubated 5/1. Pt with Acute/chronic hypoxic and hypercarbic respiratory failure; A-fib with RVR.  Pt with 5 inpatient admissions in the last 6 months. Recently discharged 5/4 and readmitted 5/5. Pt on 3L at baseline. PMH significant for a-fib on coumadin, alcohol use, COPD on 3L oxygen , heart failure EF 40%, PE, HTN, chronic pain.  Clinical Impression  Pt recently discharged 5/4 and readmitted 5/5 with respiratory failure. Pt lives alone and states he doesn't have anyone to help at DC. Pt either uses his rollator or wc in the home and his power chair in the community for errands. Pt seen on 3L and desaturated to 72% only mobilizing from bed to chair.   Pt with very poor endurance and poor safety awareness needing incr assist (mod assist of 2) to power up and for step pivot.  Pt will benefit from post acute rehab < 3 hours day as he lived alone PTA and has no assist at home. Pt currently with functional limitations due to the deficits listed below (see PT Problem List). Pt will benefit from acute skilled PT to increase their independence and safety with mobility to allow discharge.         If plan is discharge home, recommend the following: Assistance with cooking/housework;Help with stairs or ramp for entrance;Assist for transportation;A lot of help with walking and/or transfers;A lot of help with bathing/dressing/bathroom;Supervision due to cognitive status   Can travel by private vehicle   No    Equipment Recommendations None recommended by PT  Recommendations for Other Services       Functional Status Assessment Patient has had a recent decline in their functional status and demonstrates the ability to make significant improvements in function in a reasonable and  predictable amount of time.     Precautions / Restrictions Precautions Precautions: Fall;Other (comment) (watch O2 sats) Recall of Precautions/Restrictions: Intact Precaution/Restrictions Comments: watch sats and  HR Restrictions Weight Bearing Restrictions Per Provider Order: No      Mobility  Bed Mobility Overal bed mobility: Needs Assistance Bed Mobility: Supine to Sit     Supine to sit: Supervision     General bed mobility comments: Pt impulsive and telling PT "Get back and dont touch me."  Pt moving impulsively and difficult to guard.  Pt refused to place socks on his feet.    Transfers Overall transfer level: Needs assistance Equipment used: Rolling walker (2 wheels) Transfers: Sit to/from Stand, Bed to chair/wheelchair/BSC Sit to Stand: Mod assist, +2 safety/equipment, From elevated surface   Step pivot transfers: Mod assist, +2 physical assistance       General transfer comment: Able to stand on 4th attempt with +2 mod assist; uses momentum; Knee buckling noted part way through transfer needing mod assist for safety with poor control of descent to the transport chair (pt being transferred to 6E)    Ambulation/Gait                  Stairs            Wheelchair Mobility     Tilt Bed    Modified Rankin (Stroke Patients Only)       Balance Overall balance assessment: Needs assistance Sitting-balance support: Feet supported, Bilateral upper extremity supported Sitting balance-Leahy Scale: Fair     Standing  balance support: Bilateral upper extremity supported Standing balance-Leahy Scale: Poor Standing balance comment: Needs bil UE support but pushes therapists away and reaches for whatever he can (not always things that are stable)                             Pertinent Vitals/Pain Pain Assessment Pain Assessment: No/denies pain    Home Living Family/patient expects to be discharged to:: Private residence Living  Arrangements: Alone Available Help at Discharge: Friend(s);Available PRN/intermittently Type of Home: Apartment Home Access: Level entry       Home Layout: One level Home Equipment: Agricultural consultant (2 wheels);Rollator (4 wheels);Wheelchair - power;Shower seat;Grab bars - tub/shower Additional Comments: Pt lives alone, has a friend who can help occasionally.    Prior Function Prior Level of Function : Independent/Modified Independent             Mobility Comments: uses power wc and standard; has a rollator and uses that as well. Unsure exactly as pt getting agitated with questions ADLs Comments: reports he drives his powerchair to get his groceries; does his bahting and dressing     Extremity/Trunk Assessment   Upper Extremity Assessment Upper Extremity Assessment: Defer to OT evaluation LUE Deficits / Details: L shoulder pain; owuld not allow  assessment of arm pain; chronic. arm ROM is functional    Lower Extremity Assessment Lower Extremity Assessment: Generalized weakness (would not let therapist test strength)    Cervical / Trunk Assessment Cervical / Trunk Assessment: Normal  Communication   Communication Communication: No apparent difficulties    Cognition Arousal: Alert Behavior During Therapy: Agitated (at times)                           PT - Cognition Comments: very poor safety awareness Following commands: Intact       Cueing Cueing Techniques: Verbal cues, Visual cues, Tactile cues     General Comments General comments (skin integrity, edema, etc.): O2 to 72% on 3L with activity.  Returned to 90% once settled in chair with 3LO2.    Exercises Other Exercises Other Exercises: attempted educaiton on pursed lip breathing however pt not listening/demonstrating   Assessment/Plan    PT Assessment Patient needs continued PT services  PT Problem List Decreased strength;Decreased activity tolerance;Decreased balance;Decreased mobility;Decreased  knowledge of use of DME;Decreased safety awareness;Cardiopulmonary status limiting activity       PT Treatment Interventions DME instruction;Gait training;Functional mobility training;Therapeutic activities;Therapeutic exercise;Cognitive remediation;Patient/family education    PT Goals (Current goals can be found in the Care Plan section)  Acute Rehab PT Goals Patient Stated Goal: return home PT Goal Formulation: With patient Time For Goal Achievement: 07/02/23 Potential to Achieve Goals: Fair    Frequency Min 2X/week     Co-evaluation               AM-PAC PT "6 Clicks" Mobility  Outcome Measure Help needed turning from your back to your side while in a flat bed without using bedrails?: None Help needed moving from lying on your back to sitting on the side of a flat bed without using bedrails?: A Little Help needed moving to and from a bed to a chair (including a wheelchair)?: Total Help needed standing up from a chair using your arms (e.g., wheelchair or bedside chair)?: A Lot Help needed to walk in hospital room?: Total Help needed climbing 3-5 steps with a railing? :  Total 6 Click Score: 12    End of Session Equipment Utilized During Treatment: Gait belt;Oxygen  Activity Tolerance: Patient limited by fatigue (self limiting) Patient left: in chair;with nursing/sitter in room Theme park manager with nurse moving to 6E) Nurse Communication: Mobility status PT Visit Diagnosis: Muscle weakness (generalized) (M62.81);Other abnormalities of gait and mobility (R26.89);Unsteadiness on feet (R26.81);Difficulty in walking, not elsewhere classified (R26.2)    Time: 5621-3086 PT Time Calculation (min) (ACUTE ONLY): 15 min   Charges:   PT Evaluation $PT Eval Moderate Complexity: 1 Mod   PT General Charges $$ ACUTE PT VISIT: 1 Visit         Lillieanna Tuohy M,PT Acute Rehab Services [phone]   Florencia Hunter 06/18/2023, 1:22 PM

## 2023-06-19 ENCOUNTER — Telehealth: Payer: Self-pay | Admitting: Acute Care

## 2023-06-19 ENCOUNTER — Other Ambulatory Visit (HOSPITAL_COMMUNITY): Payer: Self-pay

## 2023-06-19 DIAGNOSIS — I5020 Unspecified systolic (congestive) heart failure: Secondary | ICD-10-CM

## 2023-06-19 DIAGNOSIS — J9622 Acute and chronic respiratory failure with hypercapnia: Secondary | ICD-10-CM | POA: Diagnosis not present

## 2023-06-19 DIAGNOSIS — I4891 Unspecified atrial fibrillation: Secondary | ICD-10-CM | POA: Diagnosis not present

## 2023-06-19 DIAGNOSIS — J449 Chronic obstructive pulmonary disease, unspecified: Secondary | ICD-10-CM | POA: Diagnosis not present

## 2023-06-19 DIAGNOSIS — J9621 Acute and chronic respiratory failure with hypoxia: Secondary | ICD-10-CM | POA: Diagnosis not present

## 2023-06-19 LAB — CBC
HCT: 38.1 % — ABNORMAL LOW (ref 39.0–52.0)
Hemoglobin: 11.8 g/dL — ABNORMAL LOW (ref 13.0–17.0)
MCH: 31.6 pg (ref 26.0–34.0)
MCHC: 31 g/dL (ref 30.0–36.0)
MCV: 101.9 fL — ABNORMAL HIGH (ref 80.0–100.0)
Platelets: 192 10*3/uL (ref 150–400)
RBC: 3.74 MIL/uL — ABNORMAL LOW (ref 4.22–5.81)
RDW: 15.3 % (ref 11.5–15.5)
WBC: 13.3 10*3/uL — ABNORMAL HIGH (ref 4.0–10.5)
nRBC: 0 % (ref 0.0–0.2)

## 2023-06-19 LAB — BASIC METABOLIC PANEL WITH GFR
Anion gap: 7 (ref 5–15)
BUN: 23 mg/dL (ref 8–23)
CO2: 37 mmol/L — ABNORMAL HIGH (ref 22–32)
Calcium: 8.1 mg/dL — ABNORMAL LOW (ref 8.9–10.3)
Chloride: 94 mmol/L — ABNORMAL LOW (ref 98–111)
Creatinine, Ser: 0.89 mg/dL (ref 0.61–1.24)
GFR, Estimated: >60 mL/min (ref >60)
Glucose, Bld: 168 mg/dL — ABNORMAL HIGH (ref 70–99)
Potassium: 4.1 mmol/L (ref 3.5–5.1)
Sodium: 138 mmol/L (ref 135–145)

## 2023-06-19 LAB — GLUCOSE, CAPILLARY: Glucose-Capillary: 161 mg/dL — ABNORMAL HIGH (ref 70–99)

## 2023-06-19 LAB — MAGNESIUM: Magnesium: 1.9 mg/dL (ref 1.7–2.4)

## 2023-06-19 MED ORDER — PREDNISONE 20 MG PO TABS
40.0000 mg | ORAL_TABLET | Freq: Every day | ORAL | 0 refills | Status: AC
  Filled 2023-06-19: qty 2, 1d supply, fill #0

## 2023-06-19 MED ORDER — MAGNESIUM OXIDE -MG SUPPLEMENT 400 (240 MG) MG PO TABS: 400.0000 mg | ORAL_TABLET | Freq: Every day | ORAL | Status: DC

## 2023-06-19 MED ORDER — FLUCONAZOLE 200 MG PO TABS
200.0000 mg | ORAL_TABLET | Freq: Every day | ORAL | 0 refills | Status: AC
  Filled 2023-06-19: qty 10, 10d supply, fill #0

## 2023-06-19 MED ORDER — AZITHROMYCIN 250 MG PO TABS
ORAL_TABLET | ORAL | 0 refills | Status: DC
Start: 2023-06-19 — End: 2023-06-19

## 2023-06-19 MED ORDER — BISOPROLOL FUMARATE 5 MG PO TABS
5.0000 mg | ORAL_TABLET | Freq: Every day | ORAL | 0 refills | Status: AC
  Filled 2023-06-19: qty 30, 30d supply, fill #0

## 2023-06-19 NOTE — Progress Notes (Signed)
 CSW added outpatient substance use treatment services resources to patients AVS.

## 2023-06-19 NOTE — Discharge Summary (Addendum)
 Physician Discharge Summary         Patient ID: Johnny Rodriguez MRN: [id number] DOB/AGE: 1950/03/23 72 y.o.  Admit date: 06/15/2023 Discharge date: 06/19/2023  Discharge Diagnoses:    Active Hospital Problems   Diagnosis Date Noted   Acute on chronic respiratory failure (HCC) 06/15/2023   Acute respiratory failure with hypercapnia (HCC) 06/15/2023    Resolved Hospital Problems  No resolved problems to display.      Discharge summary    73 year old man with PMHx significant for COPD on home O2 @ 3 L [place], PE/DVT on Eliquis , HFrEF with EF 35-40%, HTN and chronic pain who presented to the ED after being discharged home from the hospital 5/4 with increased shortness of breath and was hypoxic to 50% on [place] on arrival. He was placed on Bipap, given nebs but remained in distress with accessory muscle use and diaphoreses. ABG showed pCo2 of 74, though compensated with a pH of 7.3, his lactic acid was 2.0 and WBC had increased to 23k from 13.6 the day before. Given his level of respiratory distress PCCM was consulted and pt was intubated in the ED and admitted to the ICU. He was extubated two days later and transitioned to [place].  He had significant desaturations with ambulation and he lives alone.  He worked with Physical therapy and had poor endurance and safety awareness and discharge to rehab was recommended, however patient demanded discharge home or stated that he would leave AMA.   Discharge Plan by Active Problems    Acute-on-chronic hypoxic and hypercarbic respiratory failure  Possible aspiration event vs COPD exacerbation  Intubated in the ED. Extubated 5/6PM. -continue home supplemental O2 3L, he is refusing discharge to rehab as recommended though has poor endurance and safety awareness with activity - continue home albuterol , Spiriva , and Breo Ellipta   - Supplemental O2 support for SpO2 88-92% - On home O2 3 LNC at baseline - Bronchodilators (Brovana /Yupelri , DuoNebs PRN); no  Pulmicort  due to oral thrush - Will finish 5 day course of PO prednisone   - Resp Cx 4/30 with +PsA, pan-sensitive; BCx NGTD - Has been on Zosyn , received 5 days of Rx   Afib with RVR HFrEF  Hx of DVT/PE Last Echo 05/2023 EF 35-40%, RVSP 36 mmHg - s/p Amiodarone  drip and IV Lasix   - Cardiology consulted, appreciate recommendations - Discharge with bisprolol 5mg  every day instead of prior metoprolol  per cardiology recommendations will need close outpatient follow up  - Eliquis  for [facility] - resume home statin and Asa  Oral thrush - Fluconazole  x 14-day course with recent steroid use   Significant Hospital tests/ studies    Procedures   ETT 5/5-5/6  Culture data/antimicrobials    5/5 Bcx2> NGTD    Consults    Cardiology Physical Therapy  Discharge Exam: BP 110/69 (BP Location: Left Arm)   Pulse 71   Temp 97.7 F (36.5 C) (Oral)   Resp 18   Ht 5\' 11"  (1.803 m)   Wt 88.2 kg   SpO2 98%   BMI 27.12 kg/m   General:   Afebrile, A&Ox4 HEENT: MM pink/moist PERL Oral thrush  Lungs: fair and symmetrical air entry. No wheezing or crackles  Heart: NL S1/S2, no m/g/r Abd: no distension or tenderness LL: +1 edema, symmetrical  CNS: Nonfocal, A&Ox4 Skin: no rashes or lesions  Labs at discharge   Lab Results  Component Value Date   CREATININE 0.89 06/19/2023   BUN 23 06/19/2023   NA 138 06/19/2023  K 4.1 06/19/2023   CL 94 (L) 06/19/2023   CO2 37 (H) 06/19/2023   Lab Results  Component Value Date   WBC 13.3 (H) 06/19/2023   HGB 11.8 (L) 06/19/2023   HCT 38.1 (L) 06/19/2023   MCV 101.9 (H) 06/19/2023   PLT 192 06/19/2023   Lab Results  Component Value Date   ALT 23 06/13/2023   AST 13 (L) 06/13/2023   ALKPHOS 45 06/13/2023   BILITOT 0.5 06/13/2023   Lab Results  Component Value Date   INR 1.4 (H) 06/03/2023   INR 1.1 03/26/2022   INR 1.1 09/05/2019    Current radiological studies    No results found.  Disposition:    Home despite recommendations  for discharge to rehab  There are no questions and answers to display.          Allergies as of 06/19/2023   No Known Allergies      Medication List     STOP taking these medications    cefdinir  300 MG capsule Commonly known as: OMNICEF    Metoprolol  Tartrate 37.5 MG Tabs       TAKE these medications    albuterol  (2.5 MG/3ML) 0.083% nebulizer solution Commonly known as: PROVENTIL  Take 3 mLs (2.5 mg total) by nebulization every 4 (four) hours as needed for wheezing or shortness of breath. What changed: when to take this   Ventolin  HFA 108 (90 Base) MCG/ACT inhaler Generic drug: albuterol  Inhale 1-2 puffs into the lungs every 6 (six) hours as needed for wheezing or shortness of breath What changed:  how much to take reasons to take this   aspirin  EC 81 MG tablet Take 1 tablet (81 mg total) by mouth daily. Swallow whole.   bisoprolol  5 MG tablet Commonly known as: ZEBETA  Take 1 tablet (5 mg total) by mouth daily.   Breo Ellipta  100-25 MCG/ACT Aepb Generic drug: fluticasone  furoate-vilanterol Inhale 1 puff into the lungs daily. What changed: how much to take   Eliquis  5 MG Tabs tablet Generic drug: apixaban  Take 1 tablet (5 mg total) by mouth 2 (two) times daily.   fluconazole  200 MG tablet Commonly known as: DIFLUCAN  Take 1 tablet (200 mg total) by mouth daily for 10 days.   folic acid  1 MG tablet Commonly known as: FOLVITE  Take 1 tablet (1 mg total) by mouth daily.   gabapentin  400 MG capsule Commonly known as: NEURONTIN  Take 1 capsule (400 mg total) by mouth daily as needed (for nerve pain). What changed: when to take this   linaclotide  145 MCG Caps capsule Commonly known as: Linzess  Take 1 capsule (145 mcg total) by mouth daily before breakfast.   meloxicam 15 MG tablet Commonly known as: MOBIC Take 15 mg by mouth daily.   OXYGEN  Inhale 3 L/min into the lungs continuous.   pantoprazole  40 MG tablet Commonly known as: PROTONIX  Take 1  tablet (40 mg total) by mouth daily as needed (acid reflux). What changed: when to take this   polyethylene glycol 17 g packet Commonly known as: MIRALAX  / GLYCOLAX  Take 17 g by mouth daily. What changed:  when to take this reasons to take this   pravastatin 10 MG tablet Commonly known as: PRAVACHOL Take 10 mg by mouth daily.   predniSONE  20 MG tablet Commonly known as: DELTASONE  Take 2 tablets (40 mg total) by mouth daily with breakfast. Start taking on: Jun 20, 2023 What changed:  medication strength See the new instructions.   senna 8.6 MG Tabs  tablet Commonly known as: SENOKOT Take 1-2 tablets (8.6-17.2 mg total) by mouth daily. What changed:  how much to take when to take this reasons to take this   Spiriva  Respimat 2.5 MCG/ACT Aers Generic drug: Tiotropium Bromide  Monohydrate Inhale 2 puffs into the lungs daily.   traZODone  50 MG tablet Commonly known as: DESYREL  Take 1 tablet (50 mg total) by mouth at bedtime as needed for sleep. What changed: when to take this   VITAMIN D -3 PO Take 1 capsule by mouth daily.         Follow-up appointment   Follow up with PCP, pulmonary, and cardiology as soon as possible  Discharge Condition:    fair  Signed:

## 2023-06-19 NOTE — Discharge Instructions (Signed)

## 2023-06-19 NOTE — Care Management (Signed)
 Late Entry: 1338 06-19-23 Case Manager received a secure chat from CSW that patient declined SNF. Case Manager attempted to arrange [facility]. Patient states he is active; however, unable to remember the agency name. Patient unwilling to remain in the hospital for Case Manager to explore agencies. Patient adamantly saying he is ready to go. MD aware and Staff RN has transferred patient off the unit.

## 2023-06-19 NOTE — Care Management Important Message (Signed)
 Important Message  Patient Details  Name: Johnny Rodriguez MRN: [id number] Date of Birth: 09-14-1950   Important Message Given:  Yes - Medicare IM     Felix Host 06/19/2023, 2:38 PM

## 2023-06-20 LAB — CULTURE, BLOOD (ROUTINE X 2)
Culture: NO GROWTH
Culture: NO GROWTH

## 2023-06-21 ENCOUNTER — Encounter (HOSPITAL_COMMUNITY): Payer: Self-pay | Admitting: Emergency Medicine

## 2023-06-21 ENCOUNTER — Inpatient Hospital Stay (HOSPITAL_COMMUNITY)
Admission: EM | Admit: 2023-06-21 | Discharge: 2023-06-23 | DRG: 291 | Disposition: A | Discharge status: Home / Self Care | Attending: Family Medicine | Admitting: Family Medicine

## 2023-06-21 ENCOUNTER — Other Ambulatory Visit: Payer: Self-pay

## 2023-06-21 ENCOUNTER — Emergency Department (HOSPITAL_COMMUNITY)

## 2023-06-21 DIAGNOSIS — I429 Cardiomyopathy, unspecified: Secondary | ICD-10-CM | POA: Diagnosis present

## 2023-06-21 DIAGNOSIS — I5023 Acute on chronic systolic (congestive) heart failure: Secondary | ICD-10-CM | POA: Diagnosis present

## 2023-06-21 DIAGNOSIS — Z7189 Other specified counseling: Secondary | ICD-10-CM

## 2023-06-21 DIAGNOSIS — G9341 Metabolic encephalopathy: Secondary | ICD-10-CM | POA: Diagnosis present

## 2023-06-21 DIAGNOSIS — J440 Chronic obstructive pulmonary disease with acute lower respiratory infection: Secondary | ICD-10-CM | POA: Diagnosis present

## 2023-06-21 DIAGNOSIS — Z825 Family history of asthma and other chronic lower respiratory diseases: Secondary | ICD-10-CM | POA: Diagnosis not present

## 2023-06-21 DIAGNOSIS — Z79899 Other long term (current) drug therapy: Secondary | ICD-10-CM

## 2023-06-21 DIAGNOSIS — Z66 Do not resuscitate: Secondary | ICD-10-CM | POA: Diagnosis present

## 2023-06-21 DIAGNOSIS — D539 Nutritional anemia, unspecified: Secondary | ICD-10-CM | POA: Diagnosis present

## 2023-06-21 DIAGNOSIS — Z86718 Personal history of other venous thrombosis and embolism: Secondary | ICD-10-CM

## 2023-06-21 DIAGNOSIS — Z515 Encounter for palliative care: Secondary | ICD-10-CM | POA: Diagnosis not present

## 2023-06-21 DIAGNOSIS — J189 Pneumonia, unspecified organism: Secondary | ICD-10-CM | POA: Diagnosis present

## 2023-06-21 DIAGNOSIS — J9621 Acute and chronic respiratory failure with hypoxia: Secondary | ICD-10-CM | POA: Diagnosis present

## 2023-06-21 DIAGNOSIS — R4182 Altered mental status, unspecified: Secondary | ICD-10-CM

## 2023-06-21 DIAGNOSIS — J9622 Acute and chronic respiratory failure with hypercapnia: Secondary | ICD-10-CM | POA: Diagnosis present

## 2023-06-21 DIAGNOSIS — D649 Anemia, unspecified: Secondary | ICD-10-CM

## 2023-06-21 DIAGNOSIS — R627 Adult failure to thrive: Secondary | ICD-10-CM | POA: Diagnosis present

## 2023-06-21 DIAGNOSIS — I5020 Unspecified systolic (congestive) heart failure: Secondary | ICD-10-CM | POA: Diagnosis not present

## 2023-06-21 DIAGNOSIS — F1010 Alcohol abuse, uncomplicated: Secondary | ICD-10-CM | POA: Diagnosis present

## 2023-06-21 DIAGNOSIS — I110 Hypertensive heart disease with heart failure: Principal | ICD-10-CM | POA: Diagnosis present

## 2023-06-21 DIAGNOSIS — I959 Hypotension, unspecified: Secondary | ICD-10-CM | POA: Diagnosis present

## 2023-06-21 DIAGNOSIS — R739 Hyperglycemia, unspecified: Secondary | ICD-10-CM | POA: Diagnosis present

## 2023-06-21 DIAGNOSIS — I2510 Atherosclerotic heart disease of native coronary artery without angina pectoris: Secondary | ICD-10-CM | POA: Diagnosis present

## 2023-06-21 DIAGNOSIS — Z7952 Long term (current) use of systemic steroids: Secondary | ICD-10-CM

## 2023-06-21 DIAGNOSIS — Z8249 Family history of ischemic heart disease and other diseases of the circulatory system: Secondary | ICD-10-CM | POA: Diagnosis not present

## 2023-06-21 DIAGNOSIS — Z5982 Transportation insecurity: Secondary | ICD-10-CM

## 2023-06-21 DIAGNOSIS — I4891 Unspecified atrial fibrillation: Secondary | ICD-10-CM | POA: Diagnosis present

## 2023-06-21 DIAGNOSIS — E860 Dehydration: Secondary | ICD-10-CM | POA: Diagnosis present

## 2023-06-21 DIAGNOSIS — Z9981 Dependence on supplemental oxygen: Secondary | ICD-10-CM | POA: Diagnosis not present

## 2023-06-21 DIAGNOSIS — J441 Chronic obstructive pulmonary disease with (acute) exacerbation: Secondary | ICD-10-CM | POA: Diagnosis present

## 2023-06-21 DIAGNOSIS — A419 Sepsis, unspecified organism: Secondary | ICD-10-CM | POA: Diagnosis not present

## 2023-06-21 DIAGNOSIS — R0602 Shortness of breath: Secondary | ICD-10-CM | POA: Diagnosis present

## 2023-06-21 DIAGNOSIS — Z7901 Long term (current) use of anticoagulants: Secondary | ICD-10-CM

## 2023-06-21 DIAGNOSIS — Z5320 Procedure and treatment not carried out because of patient's decision for unspecified reasons: Secondary | ICD-10-CM | POA: Diagnosis present

## 2023-06-21 DIAGNOSIS — Z791 Long term (current) use of non-steroidal anti-inflammatories (NSAID): Secondary | ICD-10-CM | POA: Diagnosis not present

## 2023-06-21 DIAGNOSIS — I509 Heart failure, unspecified: Secondary | ICD-10-CM | POA: Diagnosis not present

## 2023-06-21 DIAGNOSIS — Z86711 Personal history of pulmonary embolism: Secondary | ICD-10-CM

## 2023-06-21 DIAGNOSIS — F17210 Nicotine dependence, cigarettes, uncomplicated: Secondary | ICD-10-CM | POA: Diagnosis present

## 2023-06-21 DIAGNOSIS — Z560 Unemployment, unspecified: Secondary | ICD-10-CM

## 2023-06-21 DIAGNOSIS — E8729 Other acidosis: Secondary | ICD-10-CM | POA: Diagnosis present

## 2023-06-21 DIAGNOSIS — Z823 Family history of stroke: Secondary | ICD-10-CM

## 2023-06-21 DIAGNOSIS — R4589 Other symptoms and signs involving emotional state: Secondary | ICD-10-CM | POA: Diagnosis not present

## 2023-06-21 DIAGNOSIS — Z7982 Long term (current) use of aspirin: Secondary | ICD-10-CM

## 2023-06-21 DIAGNOSIS — J9620 Acute and chronic respiratory failure, unspecified whether with hypoxia or hypercapnia: Secondary | ICD-10-CM | POA: Diagnosis present

## 2023-06-21 HISTORY — DX: Heart failure, unspecified: I50.9

## 2023-06-21 LAB — COMPREHENSIVE METABOLIC PANEL WITH GFR
ALT: 50 U/L — ABNORMAL HIGH (ref 0–44)
AST: 26 U/L (ref 15–41)
Albumin: 3.9 g/dL (ref 3.5–5.0)
Alkaline Phosphatase: 71 U/L (ref 38–126)
Anion gap: 12 (ref 5–15)
BUN: 25 mg/dL — ABNORMAL HIGH (ref 8–23)
CO2: 39 mmol/L — ABNORMAL HIGH (ref 22–32)
Calcium: 8.7 mg/dL — ABNORMAL LOW (ref 8.9–10.3)
Chloride: 87 mmol/L — ABNORMAL LOW (ref 98–111)
Creatinine, Ser: 0.79 mg/dL (ref 0.61–1.24)
GFR, Estimated: >60 mL/min (ref >60)
Glucose, Bld: 155 mg/dL — ABNORMAL HIGH (ref 70–99)
Potassium: 4.3 mmol/L (ref 3.5–5.1)
Sodium: 138 mmol/L (ref 135–145)
Total Bilirubin: 1.6 mg/dL — ABNORMAL HIGH (ref 0.0–1.2)
Total Protein: 6.9 g/dL (ref 6.5–8.1)

## 2023-06-21 LAB — PHOSPHORUS: Phosphorus: 4.6 mg/dL (ref 2.5–4.6)

## 2023-06-21 LAB — BRAIN NATRIURETIC PEPTIDE: B Natriuretic Peptide: 569.9 pg/mL — ABNORMAL HIGH (ref 0.0–100.0)

## 2023-06-21 LAB — RESPIRATORY PANEL BY PCR

## 2023-06-21 LAB — RESP PANEL BY RT-PCR (RSV, FLU A&B, COVID)  RVPGX2
Influenza A by PCR: NEGATIVE
Influenza B by PCR: NEGATIVE
Resp Syncytial Virus by PCR: NEGATIVE
SARS Coronavirus 2 by RT PCR: NEGATIVE

## 2023-06-21 LAB — I-STAT CHEM 8, ED
BUN: 28 mg/dL — ABNORMAL HIGH (ref 8–23)
Calcium, Ion: 1.04 mmol/L — ABNORMAL LOW (ref 1.15–1.40)
Chloride: 88 mmol/L — ABNORMAL LOW (ref 98–111)
Creatinine, Ser: 0.9 mg/dL (ref 0.61–1.24)
Glucose, Bld: 144 mg/dL — ABNORMAL HIGH (ref 70–99)
HCT: 47 % (ref 39.0–52.0)
Hemoglobin: 16 g/dL (ref 13.0–17.0)
Potassium: 4.3 mmol/L (ref 3.5–5.1)
Sodium: 135 mmol/L (ref 135–145)
TCO2: 39 mmol/L — ABNORMAL HIGH (ref 22–32)

## 2023-06-21 LAB — BASIC METABOLIC PANEL WITH GFR
Anion gap: 12 (ref 5–15)
BUN: 27 mg/dL — ABNORMAL HIGH (ref 8–23)
CO2: 38 mmol/L — ABNORMAL HIGH (ref 22–32)
Calcium: 8.3 mg/dL — ABNORMAL LOW (ref 8.9–10.3)
Chloride: 87 mmol/L — ABNORMAL LOW (ref 98–111)
Creatinine, Ser: 0.74 mg/dL (ref 0.61–1.24)
GFR, Estimated: >60 mL/min (ref >60)
Glucose, Bld: 157 mg/dL — ABNORMAL HIGH (ref 70–99)
Potassium: 4.8 mmol/L (ref 3.5–5.1)
Sodium: 137 mmol/L (ref 135–145)

## 2023-06-21 LAB — BLOOD GAS, VENOUS
Acid-Base Excess: 20.2 mmol/L — ABNORMAL HIGH (ref 0.0–2.0)
Acid-Base Excess: 14.2 mmol/L — ABNORMAL HIGH (ref 0.0–2.0)
Acid-Base Excess: 18.6 mmol/L — ABNORMAL HIGH (ref 0.0–2.0)
Bicarbonate: 54.4 mmol/L — ABNORMAL HIGH (ref 20.0–28.0)
Bicarbonate: 46.2 mmol/L — ABNORMAL HIGH (ref 20.0–28.0)
Bicarbonate: 50 mmol/L — ABNORMAL HIGH (ref 20.0–28.0)
O2 Saturation: 24.3 %
O2 Saturation: 76.9 %
O2 Saturation: 69.6 %
Patient temperature: 37
Patient temperature: 37
Patient temperature: 37
pCO2, Ven: >123 mmHg (ref 44–60)
pCO2, Ven: 103 mmHg (ref 44–60)
pCO2, Ven: 97 mmHg (ref 44–60)
pH, Ven: 7.23 — ABNORMAL LOW (ref 7.25–7.43)
pH, Ven: 7.26 (ref 7.25–7.43)
pH, Ven: 7.32 (ref 7.25–7.43)
pO2, Ven: <31 mmHg — CL (ref 32–45)
pO2, Ven: 49 mmHg — ABNORMAL HIGH (ref 32–45)
pO2, Ven: 43 mmHg (ref 32–45)

## 2023-06-21 LAB — CBC WITH DIFFERENTIAL/PLATELET
Abs Immature Granulocytes: 0.1 10*3/uL — ABNORMAL HIGH (ref 0.00–0.07)
Basophils Absolute: 0 10*3/uL (ref 0.0–0.1)
Basophils Relative: 0 %
Eosinophils Absolute: 0 10*3/uL (ref 0.0–0.5)
Eosinophils Relative: 0 %
HCT: 46.1 % (ref 39.0–52.0)
Hemoglobin: 13.9 g/dL (ref 13.0–17.0)
Immature Granulocytes: 1 %
Lymphocytes Relative: 3 %
Lymphs Abs: 0.4 10*3/uL — ABNORMAL LOW (ref 0.7–4.0)
MCH: 31.7 pg (ref 26.0–34.0)
MCHC: 30.2 g/dL (ref 30.0–36.0)
MCV: 105.3 fL — ABNORMAL HIGH (ref 80.0–100.0)
Monocytes Absolute: 0.8 10*3/uL (ref 0.1–1.0)
Monocytes Relative: 4 %
Neutro Abs: 16.1 10*3/uL — ABNORMAL HIGH (ref 1.7–7.7)
Neutrophils Relative %: 92 %
Platelets: 240 10*3/uL (ref 150–400)
RBC: 4.38 MIL/uL (ref 4.22–5.81)
RDW: 15.3 % (ref 11.5–15.5)
WBC: 17.4 10*3/uL — ABNORMAL HIGH (ref 4.0–10.5)
nRBC: 0 % (ref 0.0–0.2)

## 2023-06-21 LAB — CBC
HCT: 44 % (ref 39.0–52.0)
Hemoglobin: 13.2 g/dL (ref 13.0–17.0)
MCH: 31.7 pg (ref 26.0–34.0)
MCHC: 30 g/dL (ref 30.0–36.0)
MCV: 105.5 fL — ABNORMAL HIGH (ref 80.0–100.0)
Platelets: 254 10*3/uL (ref 150–400)
RBC: 4.17 MIL/uL — ABNORMAL LOW (ref 4.22–5.81)
RDW: 15.3 % (ref 11.5–15.5)
WBC: 16.8 10*3/uL — ABNORMAL HIGH (ref 4.0–10.5)
nRBC: 0 % (ref 0.0–0.2)

## 2023-06-21 LAB — STREP PNEUMONIAE URINARY ANTIGEN: Strep Pneumo Urinary Antigen: NEGATIVE

## 2023-06-21 LAB — MAGNESIUM
Magnesium: 2.4 mg/dL (ref 1.7–2.4)
Magnesium: 2.1 mg/dL (ref 1.7–2.4)

## 2023-06-21 LAB — CBG MONITORING, ED: Glucose-Capillary: 147 mg/dL — ABNORMAL HIGH (ref 70–99)

## 2023-06-21 LAB — MRSA NEXT GEN BY PCR, NASAL: MRSA by PCR Next Gen: NOT DETECTED

## 2023-06-21 LAB — GLUCOSE, CAPILLARY
Glucose-Capillary: 174 mg/dL — ABNORMAL HIGH (ref 70–99)
Glucose-Capillary: 259 mg/dL — ABNORMAL HIGH (ref 70–99)

## 2023-06-21 MED ORDER — ONDANSETRON HCL 4 MG/2ML IJ SOLN: 4.0000 mg | Freq: Four times a day (QID) | INTRAMUSCULAR | Status: DC | PRN

## 2023-06-21 MED ORDER — INSULIN ASPART 100 UNIT/ML IJ SOLN
0.0000 [IU] | INTRAMUSCULAR | Status: DC
  Administered 2023-06-21: 2 [IU] via SUBCUTANEOUS
  Administered 2023-06-21: 3 [IU] via SUBCUTANEOUS
  Filled 2023-06-21: qty 0.15

## 2023-06-21 MED ORDER — PHENOBARBITAL 32.4 MG PO TABS
97.2000 mg | ORAL_TABLET | Freq: Three times a day (TID) | ORAL | Status: AC
  Administered 2023-06-21 – 2023-06-22 (×6): 97.2 mg via ORAL
  Filled 2023-06-21 (×6): qty 3

## 2023-06-21 MED ORDER — NOREPINEPHRINE 4 MG/250ML-% IV SOLN
0.0000 ug/min | INTRAVENOUS | Status: DC
Start: 2023-06-21 — End: 2023-06-21
  Administered 2023-06-21: 2 ug/min via INTRAVENOUS
  Filled 2023-06-21: qty 250

## 2023-06-21 MED ORDER — PHENOBARBITAL 32.4 MG PO TABS: 32.4000 mg | ORAL_TABLET | Freq: Three times a day (TID) | ORAL | Status: DC

## 2023-06-21 MED ORDER — IPRATROPIUM-ALBUTEROL 0.5-2.5 (3) MG/3ML IN SOLN
3.0000 mL | RESPIRATORY_TRACT | Status: DC
  Administered 2023-06-21 – 2023-06-22 (×10): 3 mL via RESPIRATORY_TRACT
  Filled 2023-06-21 (×10): qty 3

## 2023-06-21 MED ORDER — POLYETHYLENE GLYCOL 3350 17 G PO PACK: 17.0000 g | PACK | Freq: Every day | ORAL | Status: DC | PRN

## 2023-06-21 MED ORDER — APIXABAN 5 MG PO TABS
5.0000 mg | ORAL_TABLET | Freq: Two times a day (BID) | ORAL | Status: DC
  Administered 2023-06-21 – 2023-06-23 (×4): 5 mg via ORAL
  Filled 2023-06-21 (×4): qty 1

## 2023-06-21 MED ORDER — FUROSEMIDE 10 MG/ML IJ SOLN
40.0000 mg | Freq: Once | INTRAMUSCULAR | Status: AC
  Administered 2023-06-21: 40 mg via INTRAVENOUS
  Filled 2023-06-21: qty 4

## 2023-06-21 MED ORDER — FOLIC ACID 1 MG PO TABS
1.0000 mg | ORAL_TABLET | Freq: Every day | ORAL | Status: DC
  Administered 2023-06-21 – 2023-06-23 (×3): 1 mg via ORAL
  Filled 2023-06-21 (×3): qty 1

## 2023-06-21 MED ORDER — VANCOMYCIN HCL 1750 MG/350ML IV SOLN
1750.0000 mg | Freq: Once | INTRAVENOUS | Status: AC
  Administered 2023-06-21: 1750 mg via INTRAVENOUS
  Filled 2023-06-21: qty 350

## 2023-06-21 MED ORDER — METHYLPREDNISOLONE SODIUM SUCC 125 MG IJ SOLR
125.0000 mg | Freq: Once | INTRAMUSCULAR | Status: DC
  Filled 2023-06-21: qty 2

## 2023-06-21 MED ORDER — VANCOMYCIN HCL 1.25 G IV SOLR
1250.0000 mg | Freq: Two times a day (BID) | INTRAVENOUS | Status: DC
  Administered 2023-06-21 – 2023-06-22 (×2): 1250 mg via INTRAVENOUS
  Filled 2023-06-21 (×2): qty 25

## 2023-06-21 MED ORDER — HEPARIN SODIUM (PORCINE) 5000 UNIT/ML IJ SOLN
5000.0000 [IU] | Freq: Three times a day (TID) | INTRAMUSCULAR | Status: DC
  Administered 2023-06-21 (×2): 5000 [IU] via SUBCUTANEOUS
  Filled 2023-06-21 (×2): qty 1

## 2023-06-21 MED ORDER — BISOPROLOL FUMARATE 5 MG PO TABS
5.0000 mg | ORAL_TABLET | Freq: Every day | ORAL | Status: DC
  Administered 2023-06-21 – 2023-06-23 (×3): 5 mg via ORAL
  Filled 2023-06-21 (×3): qty 1

## 2023-06-21 MED ORDER — NOREPINEPHRINE 4 MG/250ML-% IV SOLN
2.0000 ug/min | INTRAVENOUS | Status: DC
  Administered 2023-06-21: 6 ug/min via INTRAVENOUS

## 2023-06-21 MED ORDER — DEXMEDETOMIDINE HCL IN NACL 200 MCG/50ML IV SOLN: 0.0000 ug/kg/h | INTRAVENOUS | Status: DC

## 2023-06-21 MED ORDER — VANCOMYCIN HCL IN DEXTROSE 1-5 GM/200ML-% IV SOLN: 1000.0000 mg | Freq: Two times a day (BID) | INTRAVENOUS | Status: DC

## 2023-06-21 MED ORDER — DOCUSATE SODIUM 100 MG PO CAPS: 100.0000 mg | ORAL_CAPSULE | Freq: Two times a day (BID) | ORAL | Status: DC | PRN

## 2023-06-21 MED ORDER — INSULIN ASPART 100 UNIT/ML IJ SOLN
0.0000 [IU] | INTRAMUSCULAR | Status: DC
  Administered 2023-06-21 – 2023-06-22 (×2): 5 [IU] via SUBCUTANEOUS

## 2023-06-21 MED ORDER — NICOTINE 14 MG/24HR TD PT24
14.0000 mg | MEDICATED_PATCH | Freq: Every day | TRANSDERMAL | Status: DC
Start: 2023-06-21 — End: 2023-06-23
  Administered 2023-06-22: 14 mg via TRANSDERMAL
  Filled 2023-06-21 (×2): qty 1

## 2023-06-21 MED ORDER — SODIUM CHLORIDE 0.9 % IV BOLUS
1000.0000 mL | Freq: Once | INTRAVENOUS | Status: AC
  Administered 2023-06-21: 1000 mL via INTRAVENOUS

## 2023-06-21 MED ORDER — ALBUTEROL SULFATE (2.5 MG/3ML) 0.083% IN NEBU
10.0000 mg/h | INHALATION_SOLUTION | RESPIRATORY_TRACT | Status: AC
  Administered 2023-06-21: 10 mg/h via RESPIRATORY_TRACT
  Filled 2023-06-21: qty 12

## 2023-06-21 MED ORDER — SODIUM CHLORIDE 0.9 % IV SOLN
250.0000 mL | INTRAVENOUS | Status: AC
  Administered 2023-06-21: 250 mL via INTRAVENOUS

## 2023-06-21 MED ORDER — ORAL CARE MOUTH RINSE: 15.0000 mL | OROMUCOSAL | Status: DC | PRN

## 2023-06-21 MED ORDER — PIPERACILLIN-TAZOBACTAM 3.375 G IVPB
3.3750 g | Freq: Three times a day (TID) | INTRAVENOUS | Status: DC
  Administered 2023-06-21 – 2023-06-22 (×5): 3.375 g via INTRAVENOUS
  Filled 2023-06-21 (×5): qty 50

## 2023-06-21 MED ORDER — CHLORHEXIDINE GLUCONATE CLOTH 2 % EX PADS
6.0000 | MEDICATED_PAD | Freq: Every day | CUTANEOUS | Status: DC
  Administered 2023-06-21 – 2023-06-22 (×2): 6 via TOPICAL

## 2023-06-21 MED ORDER — PHENOBARBITAL 32.4 MG PO TABS
64.8000 mg | ORAL_TABLET | Freq: Three times a day (TID) | ORAL | Status: DC
Start: 2023-06-23 — End: 2023-06-25
  Administered 2023-06-23 (×2): 64.8 mg via ORAL
  Filled 2023-06-21 (×2): qty 2

## 2023-06-21 MED ORDER — THIAMINE MONONITRATE 100 MG PO TABS
100.0000 mg | ORAL_TABLET | Freq: Every day | ORAL | Status: DC
  Administered 2023-06-21 – 2023-06-22 (×2): 100 mg via ORAL
  Filled 2023-06-21 (×2): qty 1

## 2023-06-21 MED ORDER — METHYLPREDNISOLONE SODIUM SUCC 40 MG IJ SOLR
40.0000 mg | Freq: Two times a day (BID) | INTRAMUSCULAR | Status: DC
  Administered 2023-06-21 – 2023-06-22 (×3): 40 mg via INTRAVENOUS
  Filled 2023-06-21 (×3): qty 1

## 2023-06-21 NOTE — Progress Notes (Signed)
 Patient refused CBG check. Patient educated the importance of check blood glucose level. Patient still refused. Jessi RN notified.

## 2023-06-21 NOTE — Progress Notes (Addendum)
 Patient was initially agitated and very short with nursing staff upon admission. He has calmed down now and is has been pleasant and cooperative. All needs met at this time. Call bell in reach, bed alarm on. Patient has $995.00 cash in wallet that he has at bedside. He REFUSES to let nurse take money to lock it up. Charge nurse made aware.

## 2023-06-21 NOTE — Progress Notes (Signed)
 This RN agrees with previous RN's 0800 shift assessment. Reassessment charted at 1600 reflecting this.

## 2023-06-21 NOTE — Progress Notes (Signed)
 RT placed pt on 10 LPM HF. Pt doing well at this time.

## 2023-06-21 NOTE — Progress Notes (Signed)
 Sent Dr. Diania Fortes a message regarding medication for rate control. Takes eliquis  at home. Waiting for response at this time.

## 2023-06-21 NOTE — Consult Note (Signed)
 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  Patient had recently been discharged from the hospital on 06/19/2023 for management of same concern and had requested to leave AMA during that hospitalization.  Patient has had multiple admissions for COPD exacerbations and progressive decline in his respiratory status.  Patient has been resistant to multiple medical interventions such as BiPAP.  Patient did require intubation previously.  Patient now having likely pneumonia as well.  Palliative medicine team consulted to assist with complex medical decision making.  Extensive review of EMR prior to presenting to bedside.  Patient receiving respiratory support in ICU via HHFNC.  Patient already spoke with PCCM provider and noted wanted to change CODE STATUS to DNR/DNI.  Discussed care with PCCM provider to coordinate care. Patient lives alone and does not have any family.  EMR notes friend-Billy Farlow.  Discussed care with RN for medical updates as well.  Presented to bedside to see patient.  Patient laying in bed on HHFNC.  Introduced myself as a member of the palliative medicine team and my role in patient's medical care.  Spent lots of time learning about patient's medical journey.  Patient described his history of being homeless for many years.  Patient describes events recently and how he stays in an apartment.  Patient acknowledges that he has never been married and does not have  any children.  Patient notes that his only real contact is his friend-Billy Farlow.  Patient noted that bili while able to stop and assist him, cannot be present at all times at home to help him.  Patient has had worsening functional status at home.  Patient describes frustration with his current health situation.  Spent time discussing patient's underlying medical illness.  Patient voicing frustration about why he is not "getting better".  I again reviewed his functional status and current setting of his medical illnesses.  Patient also voices great frustration at hospital system for being intubated previously.  Noted that his CODE STATUS has been appropriately updated to DNR/DNI as per his wishes.  Spent time trying to explore what patient was hoping for moving forward.  Patient slightly tangential in thoughts regarding this.  Patient describes wanting to enjoy time at home playing spades, eating cinnamon rolls, and drinking a big pot of coffee.  Patient also acknowledges that he is unable to take care of himself to do those things.  Patient brought up hospice care at home.  Spent time discussing philosophy of hospice including being focused on symptom management and quality of life when someone has a prognosis of approximately 6 months or less.  Explained what hospice would and would not support at home.  Expressed concern that patient does not have a full-time caregiver to assist in his deconditioned state.  Patient stated that he saw on TV that hospice will come out and take care of him for hours during the day.  Discussed that while hospice can provide additional support at home, they do not provide  a 24/7 caregiver at home.  Encourage patient talk to his friend about supporting his care at home or considering paying out-of-pocket for caregivers at home.  Patient voiced frustration about this and why hospice would not care for him 24/7 at home.  Acknowledged difficulties with healthcare system. Patient then  stating that he would want hospice available if he were send at home.  He initially stated that he would not want to come back to the hospital then quickly back paddled and stated he would not want to come to the hospital.  Again we reviewed what his overall goals for medical care were.  Patient during this conversation became increasingly frustrated.  Patient having difficulties processing what he ideally wants versus what is actually available within the confines of the healthcare system.  Patient wants to regain strength and be back to the person he was a long time ago while also not wanting to undergo multiple medical interventions and not have to return to the hospital to improve his health.  Spent time acknowledging difficulties with frustrating situations.  Encourage patient to talk to his friend about supporting medical care moving forward.  Also encouraged patient that he needs to complete HCPOA documentation which can be assisted with here in the hospital although would also need to complete POA documentation outside of the hospital.  Patient stated he was going to reach out to his friend to discuss further.  Noted palliative medicine team would continue to follow-up with patient's medical journey.  Discussed care with RN and PCCM provider to coordinate care.  Primary Diagnoses  Present on Admission:  Acute and chronic respiratory failure (acute-on-chronic) (HCC)   Palliative Review of Systems: Swollen legs  Past Medical History:  Diagnosis Date   AF (atrial fibrillation) (HCC)    Alcohol abuse    Angina pectoris    CAD (coronary artery disease) 12/15/2004   COPD (chronic obstructive pulmonary disease) (HCC)    Coronary artery disease    Heavy cigarette smoker    HTN (hypertension)    Idiopathic cardiomyopathy (HCC) 07/16/2009   Non compliance with medical treatment    Pulmonary embolism (HCC) 03/27/2022   Seizures (HCC)    Social History   Socioeconomic History   Marital  status: Single    Spouse name: Not on file   Number of children: Not on file   Years of education: Not on file   Highest education level: Not on file  Occupational History   Occupation: Production manager: UNEMPLOYED  Tobacco Use   Smoking status: Every Day    Current packs/day: 1.00    Average packs/day: 1 pack/day for 58.0 years (58.0 ttl pk-yrs)    Types: Cigarettes   Smokeless tobacco: Never   Tobacco comments:    pt states he is down to 1ppd 09/20/2020  Vaping Use   Vaping status: Never Used  Substance and Sexual Activity   Alcohol use: Yes    Comment: Drinks occasionally - not everyday   Drug use: Yes    Types: Marijuana   Sexual activity: Not on file  Other Topics Concern   Not on file  Social History Narrative   ** Merged History Encounter **       Social Drivers of Health   Financial Resource Strain: Low Risk  (11/21/2022)   Overall Financial Resource Strain (CARDIA)    Difficulty of Paying Living Expenses: Not hard at all  Food Insecurity: Patient Unable To Answer (06/15/2023)   Hunger Vital  Sign    Worried About Programme researcher, broadcasting/film/video in the Last Year: Patient unable to answer    Ran Out of Food in the Last Year: Patient unable to answer  Transportation Needs: Patient Unable To Answer (06/15/2023)   PRAPARE - Transportation    Lack of Transportation (Medical): Patient unable to answer    Lack of Transportation (Non-Medical): Patient unable to answer  Recent Concern: Transportation Needs - Unmet Transportation Needs (05/18/2023)   PRAPARE - Administrator, Civil Service (Medical): Yes    Lack of Transportation (Non-Medical): No  Physical Activity: Inactive (11/21/2022)   Exercise Vital Sign    Days of Exercise per Week: 0 days    Minutes of Exercise per Session: 0 min  Stress: No Stress Concern Present (11/21/2022)   [employer] of Occupational Health - Occupational Stress Questionnaire    Feeling of Stress : Only a little  Social  Connections: Patient Unable To Answer (06/15/2023)   Social Connection and Isolation Panel [NHANES]    Frequency of Communication with Friends and Family: Patient unable to answer    Frequency of Social Gatherings with Friends and Family: Patient unable to answer    Attends Religious Services: Patient unable to answer    Active Member of Clubs or Organizations: Patient unable to answer    Attends Banker Meetings: Patient unable to answer    Marital Status: Patient unable to answer  Recent Concern: Social Connections - Socially Isolated (05/18/2023)   Social Connection and Isolation Panel [NHANES]    Frequency of Communication with Friends and Family: Never    Frequency of Social Gatherings with Friends and Family: Never    Attends Religious Services: Never    Database administrator or Organizations: No    Attends Engineer, structural: Never    Marital Status: Never married   Family History  Problem Relation Age of Onset   Suicidality Sister    CVA Mother    Stroke Mother    Heart disease Father    Coronary artery disease Father    COPD Father    Scheduled Meds:  heparin   5,000 Units Subcutaneous Q8H   insulin  aspart  0-15 Units Subcutaneous Q4H   ipratropium-albuterol   3 mL Nebulization Q4H   methylPREDNISolone  sodium succinate  125 mg Intravenous Once   methylPREDNISolone  (SOLU-MEDROL ) injection  40 mg Intravenous Q12H   Continuous Infusions:  sodium chloride  250 mL (06/21/23 0623)   norepinephrine  (LEVOPHED ) Adult infusion Stopped (06/21/23 0606)   piperacillin -tazobactam (ZOSYN )  IV 12.5 mL/hr at 06/21/23 0604   vancomycin      vancomycin  1,750 mg (06/21/23 0622)   PRN Meds:.docusate sodium , ondansetron  (ZOFRAN ) IV, polyethylene glycol No Known Allergies CBC:    Component Value Date/Time   WBC 16.8 (H) 06/21/2023 0443   HGB 13.2 06/21/2023 0443   HGB 14.2 08/15/2022 1420   HCT 44.0 06/21/2023 0443   HCT 44.0 08/15/2022 1420   PLT 254 06/21/2023  0443   PLT 202 08/15/2022 1420   MCV 105.5 (H) 06/21/2023 0443   MCV 94 08/15/2022 1420   MCV 93 02/05/2014 2217   NEUTROABS 16.1 (H) 06/21/2023 0059   LYMPHSABS 0.4 (L) 06/21/2023 0059   MONOABS 0.8 06/21/2023 0059   EOSABS 0.0 06/21/2023 0059   BASOSABS 0.0 06/21/2023 0059   Comprehensive Metabolic Panel:    Component Value Date/Time   NA 137 06/21/2023 0443   NA 147 (H) 08/15/2022 1420   NA 139 02/05/2014 2217  K 4.8 06/21/2023 0443   K 4.2 02/05/2014 2217   CL 87 (L) 06/21/2023 0443   CL 104 02/05/2014 2217   CO2 38 (H) 06/21/2023 0443   CO2 28 02/05/2014 2217   BUN 27 (H) 06/21/2023 0443   BUN 16 08/15/2022 1420   BUN 18 02/05/2014 2217   CREATININE 0.74 06/21/2023 0443   CREATININE 1.10 02/05/2014 2217   GLUCOSE 157 (H) 06/21/2023 0443   GLUCOSE 91 02/05/2014 2217   CALCIUM  8.3 (L) 06/21/2023 0443   CALCIUM  8.8 02/05/2014 2217   AST 26 06/21/2023 0059   ALT 50 (H) 06/21/2023 0059   ALKPHOS 71 06/21/2023 0059   BILITOT 1.6 (H) 06/21/2023 0059   BILITOT 0.4 08/15/2022 1420   PROT 6.9 06/21/2023 0059   PROT 6.6 08/15/2022 1420   ALBUMIN  3.9 06/21/2023 0059   ALBUMIN  4.0 08/15/2022 1420    Physical Exam: Vital Signs: BP 91/63   Pulse (!) 109   Temp 98.8 F (37.1 C) (Oral)   Resp 20   Wt 88 kg   SpO2 97%   BMI 27.06 kg/m  SpO2: SpO2: 97 % O2 Device: O2 Device: Heated High Flow Nasal Cannula O2 Flow Rate: O2 Flow Rate (L/min): 30 L/min Intake/output summary:  Intake/Output Summary (Last 24 hours) at 06/21/2023 0703 Last data filed at 06/21/2023 0610 Gross per 24 hour  Intake 1087.91 ml  Output 375 ml  Net 712.91 ml   LBM:   Baseline Weight: Weight: 88 kg Most recent weight: Weight: 88 kg  General: Awake, frustrated, chronically ill-appearing Cardiovascular: Tachycardia noted, anasarca in all extremities Respiratory: on HHFNC Skin: Multiple ecchymoses on bilateral lower extremities Neuro: Awake, agitated and frustrated          Palliative  Performance Scale: 40%               Additional Data Reviewed: Recent Labs    06/21/23 0059 06/21/23 0108 06/21/23 0443  WBC 17.4*  --  16.8*  HGB 13.9 16.0 13.2  PLT 240  --  254  NA 138 135 137  BUN 25* 28* 27*  CREATININE 0.79 0.90 0.74    Imaging: DG Chest Port 1 View CLINICAL DATA:  Initial evaluation for acute dyspnea.  EXAM: PORTABLE CHEST 1 VIEW  COMPARISON:  Prior radiograph from 06/15/2023  FINDINGS: Transverse heart size within normal limits. Mediastinal silhouette within normal limits. Aortic atherosclerosis.  Changes of emphysema noted. Superimposed streaky and bibasilar opacities could reflect atelectasis or infiltrates. Suspected trace right pleural effusion. Left costophrenic angle incompletely visualized. No overt pulmonary edema. No pneumothorax.  Few remotely healed left rib fractures noted. Osteopenia. No acute osseous finding. Osteoarthritic changes noted about the shoulders.  IMPRESSION: 1. Streaky and bibasilar opacities, which could reflect atelectasis or infiltrates. 2. Suspected trace right pleural effusion. 3. Aortic Atherosclerosis (ICD10-I70.0) and Emphysema (ICD10-J43.9).  Electronically Signed   By: Virgia Griffins M.D.   On: 06/21/2023 01:45    I personally reviewed recent imaging.   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  Patient had recently been discharged from the hospital on 06/19/2023 for management of same concern and had requested to leave AMA during that hospitalization.  Patient has had  multiple admissions for COPD exacerbations and progressive decline in his  respiratory status.  Patient has been resistant to multiple medical interventions such as BiPAP.  Patient did require intubation previously.  Patient now having likely pneumonia as well.  Palliative medicine team consulted to assist with complex medical decision making.  # Complex medical decision making/goals of care  - Discussed care with patient as detailed above in HPI.  Patient having difficulties processing severity of his multiple medical conditions and why patient cannot return to what his life was like even a year ago.  Spent time explaining progression of multiple medical illnesses.  Patient wanting to improve while also not wanting to have medical interventions and returning to medical providers for follow-up.  Patient describes his quality of life would be staying at home independently to enjoy card games and coffee.  While acknowledging this, discussed patient's debilitated state and resources would be needed to support this.  Introduced the idea of home hospice support and what it would and would not provide.  Patient under misconception that hospice would have provider present at home with him 4 hours during the day.  Noted home hospice does not provide this though can pay out-of-pocket for caregivers.  Patient frustrated with current medical situation.  Noted palliative medicine team and continue to follow along to engage in conversation as able and appropriate.  -  Code Status: Limited: Do not attempt resuscitation (DNR) -DNR-LIMITED -Do Not Intubate/DNI     # Psycho-social/Spiritual Support:  - Support System: [name]  # Discharge Planning:  To Be Determined  Thank you for allowing the palliative care team to participate in the care [name].  [name], DO Palliative Care Provider PMT # [phone]  If patient remains symptomatic despite maximum doses, please call PMT at [phone] between 0700 and 1900. Outside of these hours, please call attending, as  PMT does not have night coverage.  Personally spent 80 minutes in patient care including extensive chart review (labs, imaging, progress/consult notes, vital signs), medically appropraite exam, discussed with treatment team, education to patient, family, and staff, documenting clinical information, medication review and management, coordination of care, and available advanced directive documents.

## 2023-06-21 NOTE — Progress Notes (Signed)
   06/21/23 0800  Oxygen  Therapy/Pulse Ox  O2 Device (S)  HHFNC  O2 Therapy Oxygen  humidified  Heater temperature (S)  87.8 F (31 C)  O2 Flow Rate (L/min) (S)  35 L/min  FiO2 (%) (S)  35 %  SpO2 98 %  Safety Instructions Yes (Comment)

## 2023-06-21 NOTE — Progress Notes (Signed)
 Refused rehab at last discharge

## 2023-06-21 NOTE — Progress Notes (Signed)
 Pharmacy Antibiotic Note  Johnny Rodriguez is a 73 y.o. male admitted on 06/21/2023 with sepsis.  PMH significant for COPD, AFib, CAD, PE and seizures.  Recent hospitalization (discharged 06/14/23).  Pharmacy has been consulted for Vancomycin  and Zosyn  dosing.  Plan: Zosyn  3.375g IV q8h (4 hour infusion). Vancomycin  1750mg  IV x 1 followed by Vancomycin  1000 mg IV Q 12 hrs. Goal AUC 400-550.  Expected AUC: 444.9  SCr used: 0.9 Daily SCr while on both Vancomycin  and Zosyn  F/u culture results & sensitivities  Weight: 88 kg (194 lb 0.1 oz)  Temp (24hrs), Avg:98.5 F (36.9 C), Min:98.5 F (36.9 C), Max:98.5 F (36.9 C)  Recent Labs  Lab 06/15/23 1052 06/15/23 1059 06/15/23 1431 06/15/23 1729 06/16/23 0302 06/16/23 0747 06/16/23 8119 06/16/23 1702 06/17/23 0715 06/18/23 0431 06/19/23 0406 06/21/23 0059 06/21/23 0108  WBC 23.1*  --   --   --   --  13.0*  --   --   --  12.3* 13.3* 17.4*  --   CREATININE 0.80 0.90  --   --    < >  --   --    < > 1.27* 0.71 0.89 0.79 0.90  LATICACIDVEN  --  2.0* 2.4* 4.7*  --   --  2.0*  --   --   --   --   --   --    < > = values in this interval not displayed.    Estimated Creatinine Clearance: 80.2 mL/min (by C-G formula based on SCr of 0.9 mg/dL).    No Known Allergies  Antimicrobials this admission: 5/11 Vancomycin  >>   5/11 Zosyn  >>    Dose adjustments this admission:    Microbiology results: 5/11 BCx:   5/11 Sputum:    5/11 MRSA PCR:    Thank you for allowing pharmacy to be a part of this patient's care.  Rulon Councilman, PharmD 06/21/2023 3:22 AM

## 2023-06-21 NOTE — ED Notes (Signed)
 This nurse called floor to notify of upcoming patient.

## 2023-06-21 NOTE — ED Notes (Signed)
Patient continues to refuse BiPap.

## 2023-06-21 NOTE — IPAL (Signed)
  Interdisciplinary Goals of Care Family Meeting   Date carried out: 06/21/2023  Location of the meeting: Bedside  Member's involved: Physician and Bedside Registered Nurse  Durable Power of Attorney or acting medical decision maker: Patient    Discussion: We discussed goals of care for Johnny Rodriguez .  Patient does not wish to be intubated or have CPR performed. He knows he is very sick and does not want to have aggressive measures. He is ok with heated high flow or bipap if needed. He is interested in palliative care and considering hospice. Palliative care consult will be placed.  Code status:   Code Status: Limited: Do not attempt resuscitation (DNR) -DNR-LIMITED -Do Not Intubate/DNI    Disposition: Continue current acute care  Time spent for the meeting: 15 minutes    Wilfredo Hanly, MD  06/21/2023, 9:18 AM

## 2023-06-21 NOTE — H&P (Addendum)
 NAME:  Johnny Rodriguez, MRN:  [id number], DOB:  19-Jun-1950, LOS: 0 ADMISSION DATE:  06/21/2023, CONSULTATION DATE:  06/21/2023 REFERRING MD:  Iva Mariner, MD, CHIEF COMPLAINT:  SOB   History of Present Illness:  Per D/c summary 57/10,"73 year old man with PMHx significant for COPD on home O2 @ 3 L [place], PE/DVT on Eliquis , HFrEF with EF 35-40%, HTN and chronic pain who presented to the ED after being discharged home from the hospital 5/4 with increased shortness of breath and was hypoxic to 50% on [place] on arrival. He was placed on Bipap, given nebs but remained in distress with accessory muscle use and diaphoreses. ABG showed pCo2 of 74, though compensated with a pH of 7.3, his lactic acid was 2.0 and WBC had increased to 23k from 13.6 the day before. Given his level of respiratory distress PCCM was consulted and pt was intubated in the ED and admitted to the ICU. He was extubated two days later and transitioned to [place].  He had significant desaturations with ambulation and he lives alone.  He worked with Physical therapy and had poor endurance and safety awareness and discharge to rehab was recommended, however patient demanded discharge home or stated that he would leave AMA. " Patient now returns to [facility] ED BIB [facility] c/o sob.  Patient recently d/c'd from hospital for same, EMS reports oxygen  sats at home were 70% on home 02 at 2L, they gave 3-4 duonebs and placed patient on 12 L simple mask.  Patient in obvious distress upon arrival, refusing BiPap.   Pertinent  Medical History  AF (atrial fibrillation) (HCC), Alcohol abuse, Angina pectoris, CAD (coronary artery disease) (12/15/2004), COPD (chronic obstructive pulmonary disease) (HCC), Coronary artery disease, Heavy cigarette smoker, HTN (hypertension), Idiopathic cardiomyopathy (HCC) (07/16/2009), Non compliance with medical treatment, Pulmonary embolism (HCC) (03/27/2022), and Seizures (HCC).  Significant Hospital Events: Including procedures, antibiotic  start and stop dates in addition to other pertinent events   5/11: admit to ICU  Interim History / Subjective:  N/A  Objective    Blood pressure 103/69, pulse 98, temperature 98.5 F (36.9 C), temperature source Oral, resp. rate (!) 32, weight 88 kg, SpO2 94%.    FiO2 (%):  [45 %] 45 %  No intake or output data in the 24 hours ending 06/21/23 0259 Filed Weights   06/21/23 0039  Weight: 88 kg    Examination: General: awke but lethargic, on high flow and NAD HENT: PERRLA, no icterus, EOMI, mmm Lungs: Diminished bs b/l no wheezes, rales, few scattered rhonchi Cardiovascular: reg s1s2 no gallops or rubs Abdomen: soft nt nd bs pos, no guarding Extremities: no cyanosis, clubbing, positive 2+ pitting b/l LE edema, cool to touch LE b/l Neuro: awake with some lethargy, mumbles at times, intermittent lucidity, non-focal   Resolved Hospital Problem list   N/a  Assessment & Plan:  Acute on chronic Hypoxic and hypercapnic respiratory failure Refuses BiPAP but may need intubation Follow Co2 levels and mental status Continue with hated high flow, 33 L and 44% Pneumonia, likely Hospital acquired given recent hospitalizations Start broad spectrum antibiotics Sputum c/s Hypotension Likely from sepsis and dehydration Currently on Levophed  6 mcg Sepsis Antibiotics, support BP and supportive care Altered mental status-from his high CO2, waxing and waning, refusing PAP and even intubation, but he does not have good insight currently with the high CO2.  Admit to ICU Best Practice (right click and "Reselect all SmartList Selections" daily)   Diet/type: NPO DVT prophylaxis prophylactic heparin   Pressure  ulcer(s): N/A GI prophylaxis: N/A Lines: N/A Foley:  N/A Code Status:  full code   Labs   CBC: Recent Labs  Lab 06/14/23 0629 06/15/23 1052 06/15/23 1058 06/16/23 0747 06/18/23 0431 06/19/23 0406 06/21/23 0059 06/21/23 0108  WBC 13.6* 23.1*  --  13.0* 12.3* 13.3* 17.4*  --    NEUTROABS 12.9* 19.0*  --   --   --   --  16.1*  --   HGB 11.4* 14.8   < > 13.0 12.1* 11.8* 13.9 16.0  HCT 39.1 50.0   < > 39.5 37.8* 38.1* 46.1 47.0  MCV 108.9* 106.6*  --  96.6 100.3* 101.9* 105.3*  --   PLT 192 264  --  196 209 192 240  --    < > = values in this interval not displayed.    Basic Metabolic Panel: Recent Labs  Lab 06/14/23 0629 06/15/23 1052 06/16/23 0302 06/16/23 1702 06/17/23 9147 06/17/23 0715 06/18/23 0431 06/19/23 0406 06/21/23 0059 06/21/23 0108  NA 139   < > 138 139  --  138 135 138 138 135  K 4.9   < > 4.0 3.8  --  4.0 3.6 4.1 4.3 4.3  CL 92*   < > 94* 95*  --  93* 95* 94* 87* 88*  CO2 43*   < > 27 29  --  33* 34* 37* 39*  --   GLUCOSE 248*   < > 177* 124*  --  205* 192* 168* 155* 144*  BUN 29*   < > 26* 27*  --  33* 26* 23 25* 28*  CREATININE 0.73   < > 0.93 0.95  --  1.27* 0.71 0.89 0.79 0.90  CALCIUM  8.4*   < > 8.6* 9.0  --  8.9 8.2* 8.1* 8.7*  --   MG 2.1  --  2.3 2.1 2.2  --  2.0 1.9 2.4  --   PHOS 2.4*  --  3.0 5.3*  --   --  2.6  --   --   --    < > = values in this interval not displayed.   GFR: Estimated Creatinine Clearance: 80.2 mL/min (by C-G formula based on SCr of 0.9 mg/dL). Recent Labs  Lab 06/15/23 1059 06/15/23 1431 06/15/23 1729 06/16/23 0747 06/16/23 0838 06/18/23 0431 06/19/23 0406 06/21/23 0059  WBC  --   --   --  13.0*  --  12.3* 13.3* 17.4*  LATICACIDVEN 2.0* 2.4* 4.7*  --  2.0*  --   --   --     Liver Function Tests: Recent Labs  Lab 06/21/23 0059  AST 26  ALT 50*  ALKPHOS 71  BILITOT 1.6*  PROT 6.9  ALBUMIN  3.9   No results for input(s): "LIPASE", "AMYLASE" in the last 168 hours. No results for input(s): "AMMONIA" in the last 168 hours.  ABG    Component Value Date/Time   PHART 7.45 06/16/2023 0512   PCO2ART 49 (H) 06/16/2023 0512   PO2ART 126 (H) 06/16/2023 0512   HCO3 46.2 (H) 06/21/2023 0206   TCO2 39 (H) 06/21/2023 0108   ACIDBASEDEF 4.0 (H) 03/29/2017 1604   O2SAT 76.9 06/21/2023 0206      Coagulation Profile: No results for input(s): "INR", "PROTIME" in the last 168 hours.  Cardiac Enzymes: No results for input(s): "CKTOTAL", "CKMB", "CKMBINDEX", "TROPONINI" in the last 168 hours.  HbA1C: HbA1c, POC (prediabetic range)  Date/Time Value Ref Range Status  08/15/2022 01:34 PM 6.3 5.7 - 6.4 %  Final   HbA1c, POC (controlled diabetic range)  Date/Time Value Ref Range Status  08/15/2022 03:08 PM 6.3 0.0 - 7.0 % Final   Hgb A1c MFr Bld  Date/Time Value Ref Range Status  05/06/2023 05:14 PM 6.1 (H) 4.8 - 5.6 % Final    Comment:    (NOTE) Pre diabetes:          5.7%-6.4%  Diabetes:              >6.4%  Glycemic control for   <7.0% adults with diabetes     CBG: Recent Labs  Lab 06/18/23 0743 06/18/23 1608 06/18/23 2023 06/18/23 2329 06/19/23 0448  GLUCAP 191* 268* 235* 217* 161*    Review of Systems:   SOB/DOE  Past Medical History:  He,  has a past medical history of AF (atrial fibrillation) (HCC), Alcohol abuse, Angina pectoris, CAD (coronary artery disease) (12/15/2004), COPD (chronic obstructive pulmonary disease) (HCC), Coronary artery disease, Heavy cigarette smoker, HTN (hypertension), Idiopathic cardiomyopathy (HCC) (07/16/2009), Non compliance with medical treatment, Pulmonary embolism (HCC) (03/27/2022), and Seizures (HCC).   Surgical History:   Past Surgical History:  Procedure Laterality Date   CARDIAC CATHETERIZATION  2006   CARDIAC CATHETERIZATION  12/2004   Left    CARDIOVERSION  01/21/2011   Procedure: CARDIOVERSION;  Surgeon: Elaina Graver., MD;  Location: [facility] OR;  Service: Cardiovascular;  Laterality: N/A;   DOPPLER ECHOCARDIOGRAPHY  02/2011   ELECTROPHYSIOLOGY STUDY N/A 12/16/2011   Procedure: ELECTROPHYSIOLOGY STUDY;  Surgeon: Tammie Fall, MD;  Location: [facility] C[facility] LAB;  Service: Cardiovascular;  Laterality: N/A;   EP IMPLANTABLE DEVICE N/A 08/24/2015   Procedure: Loop Recorder Removal;  Surgeon: Tammie Fall, MD;   Location: [facility] INVASIVE CV LAB;  Service: Cardiovascular;  Laterality: N/A;   fx collar bone     NO PAST SURGERIES       Social History:   reports that he has been smoking cigarettes. He has a 58 pack-year smoking history. He has never used smokeless tobacco. He reports current alcohol use. He reports current drug use. Drug: Marijuana.   Family History:  His family history includes COPD in his father; CVA in his mother; Coronary artery disease in his father; Heart disease in his father; Stroke in his mother; Suicidality in his sister.   Allergies No Known Allergies   Home Medications  Prior to Admission medications   Medication Sig Start Date End Date Taking? Authorizing Provider  albuterol  (PROVENTIL ) (2.5 MG/3ML) 0.083% nebulizer solution Take 3 mLs (2.5 mg total) by nebulization every 4 (four) hours as needed for wheezing or shortness of breath. Patient taking differently: Take 2.5 mg by nebulization 2 (two) times daily as needed for wheezing or shortness of breath. 05/16/23   Ghimire, Estil Heman, MD  aspirin  EC 81 MG tablet Take 1 tablet (81 mg total) by mouth daily. Swallow whole. 12/08/22   Ghimire, Estil Heman, MD  bisoprolol  (ZEBETA ) 5 MG tablet Take 1 tablet (5 mg total) by mouth daily. 06/19/23   Gleason, Patt Boozer, PA-C  Cholecalciferol  (VITAMIN D -3 PO) Take 1 capsule by mouth daily.    [provider]  ELIQUIS  5 MG TABS tablet Take 1 tablet (5 mg total) by mouth 2 (two) times daily. 05/16/23   Ghimire, Estil Heman, MD  fluconazole  (DIFLUCAN ) 200 MG tablet Take 1 tablet (200 mg total) by mouth daily for 10 days. 06/19/23 06/29/23  Gleason, Patt Boozer, PA-C  fluticasone  furoate-vilanterol (BREO ELLIPTA ) 100-25 MCG/ACT AEPB Inhale 1 puff  into the lungs daily. Patient taking differently: Inhale 2 puffs into the lungs daily. 05/16/23   Ghimire, Estil Heman, MD  folic acid  (FOLVITE ) 1 MG tablet Take 1 tablet (1 mg total) by mouth daily. 05/16/23   Ghimire, Estil Heman, MD  gabapentin  (NEURONTIN ) 400 MG  capsule Take 1 capsule (400 mg total) by mouth daily as needed (for nerve pain). Patient taking differently: Take 400 mg by mouth daily. 05/16/23   Ghimire, Estil Heman, MD  linaclotide  (LINZESS ) 145 MCG CAPS capsule Take 1 capsule (145 mcg total) by mouth daily before breakfast. 05/16/23   Ghimire, Estil Heman, MD  meloxicam (MOBIC) 15 MG tablet Take 15 mg by mouth daily. 06/05/23   [provider]  OXYGEN  Inhale 3 L/min into the lungs continuous.    [provider]  pantoprazole  (PROTONIX ) 40 MG tablet Take 1 tablet (40 mg total) by mouth daily as needed (acid reflux). Patient taking differently: Take 40 mg by mouth daily. 05/16/23   Ghimire, Estil Heman, MD  polyethylene glycol (MIRALAX  / GLYCOLAX ) 17 g packet Take 17 g by mouth daily. Patient taking differently: Take 17 g by mouth daily as needed for mild constipation or moderate constipation. 03/15/19   Zoanne Hinders, MD  pravastatin (PRAVACHOL) 10 MG tablet Take 10 mg by mouth daily. 06/04/23   [provider]  predniSONE  (DELTASONE ) 20 MG tablet Take 2 tablets (40 mg total) by mouth daily with breakfast. 06/20/23   Deneise Finlay D, NP  senna (SENOKOT) 8.6 MG TABS tablet Take 1-2 tablets (8.6-17.2 mg total) by mouth daily. Patient taking differently: Take 2 tablets by mouth daily as needed for moderate constipation. 03/15/19   Zoanne Hinders, MD  SPIRIVA  RESPIMAT 2.5 MCG/ACT AERS Inhale 2 puffs into the lungs daily. 05/16/23   Ghimire, Estil Heman, MD  traZODone  (DESYREL ) 50 MG tablet Take 1 tablet (50 mg total) by mouth at bedtime as needed for sleep. Patient taking differently: Take 50 mg by mouth daily. 05/16/23   Ghimire, Estil Heman, MD  VENTOLIN  HFA 108 (90 Base) MCG/ACT inhaler Inhale 1-2 puffs into the lungs every 6 (six) hours as needed for wheezing or shortness of breath Patient taking differently: Inhale 2 puffs into the lungs every 6 (six) hours as needed for wheezing or shortness of breath. 05/16/23   GhimireEstil Heman, MD      Critical care time: 41   The patient is critically ill with multiple organ system failure and requires high complexity decision making for assessment and support, frequent evaluation and titration of therapies, advanced monitoring, review of radiographic studies and interpretation of complex data.   Critical Care Time devoted to patient care services, exclusive of separately billable procedures, described in this note is 33 minutes.   Claven Cumming, MD [facility] See Amion for pager  If no response to pager , please call [phone] until 7pm After 7:00 pm call Elink  [phone] 06/21/2023, 2:59 AM

## 2023-06-21 NOTE — Progress Notes (Signed)
 Patient refuses arterial stick for ABG. Elink notified. Order received for VBG

## 2023-06-21 NOTE — ED Notes (Addendum)
 Patient cursing at staff and making inappropriate comments to staff when staff attempting to assist patient with using urinal.  Boundaries set and patient made aware that making inappropriate comments toward staff are not appropriate and will not be tolerated.

## 2023-06-21 NOTE — Progress Notes (Signed)
 Pharmacy Antibiotic Note  Johnny Rodriguez is a 73 y.o. male admitted on 06/21/2023 with sepsis.  PMH significant for COPD, AFib, CAD, PE and seizures.  Recent hospitalization (discharged 06/14/23).  Pharmacy has been consulted for Vancomycin  and Zosyn  dosing.  5/11 04:43 Scr down to 0.74 and eAUC re-calculated  Plan: Zosyn  3.375g IV q8h (4 hour infusion). Change Vancomycin  to 1250 mg IV q12h (Goal AUC 400-550.  Expected AUC: 497.8.  SCr used: rounded up to  0.8) Daily SCr while on both Vancomycin  and Zosyn  F/u culture results & sensitivities  Weight: 88 kg (194 lb 0.1 oz)  Temp (24hrs), Avg:98.7 F (37.1 C), Min:98.5 F (36.9 C), Max:98.8 F (37.1 C)  Recent Labs  Lab 06/15/23 1059 06/15/23 1431 06/15/23 1729 06/16/23 0302 06/16/23 0747 06/16/23 4098 06/16/23 1702 06/18/23 0431 06/19/23 0406 06/21/23 0059 06/21/23 0108 06/21/23 0443  WBC  --   --   --   --  13.0*  --   --  12.3* 13.3* 17.4*  --  16.8*  CREATININE 0.90  --   --    < >  --   --    < > 0.71 0.89 0.79 0.90 0.74  LATICACIDVEN 2.0* 2.4* 4.7*  --   --  2.0*  --   --   --   --   --   --    < > = values in this interval not displayed.    Estimated Creatinine Clearance: 90.2 mL/min (by C-G formula based on SCr of 0.74 mg/dL).    No Known Allergies  Antimicrobials this admission: 5/11 Vancomycin  >>   5/11 Zosyn  >>    Microbiology results: 5/11 BCx:  pending 5/11 resp panel: neg 5/11 Sputum:   to be collected 5/11 MRSA PCR:  to be collected  Thank you for allowing pharmacy to be a part of this patient's care.  Delpha Fickle, PharmD, BCPS 06/21/2023 7:55 AM

## 2023-06-21 NOTE — ED Provider Notes (Signed)
 [place] EMERGENCY DEPARTMENT AT [facility] Provider Note   CSN: [id number] Arrival date & time: 06/21/23  0037     History  Chief Complaint  Patient presents with   Shortness of Breath    Johnny Rodriguez is a 73 y.o. male.  HPI Patient presents for lethargy.  Medical history includes HTN, COPD, CAD, CHF, atrial fibrillation, prediabetes, seizures, PE.  He is on 3 L of oxygen  at baseline.  He was admitted to the hospital a week ago for acute on chronic hypoxic respiratory failure.  He required intubation and ICU admission.  While in the hospital, he had continued desaturations with ambulation.  Discharged to rehab facility was recommended, however, patient refused and manage discharge home.  He was discharged yesterday.  Today, a friend came to visit him and found him difficult to arouse.  EMS was called to the scene.  EMS noticed 70% SpO2 on his home oxygen .  He was placed on nonrebreather.  He received 3 nebulized breathing treatments prior to arrival.    Home Medications Prior to Admission medications   Medication Sig Start Date End Date Taking? Authorizing Provider  albuterol  (PROVENTIL ) (2.5 MG/3ML) 0.083% nebulizer solution Take 3 mLs (2.5 mg total) by nebulization every 4 (four) hours as needed for wheezing or shortness of breath. Patient taking differently: Take 2.5 mg by nebulization 2 (two) times daily as needed for wheezing or shortness of breath. 05/16/23   Ghimire, Estil Heman, MD  aspirin  EC 81 MG tablet Take 1 tablet (81 mg total) by mouth daily. Swallow whole. 12/08/22   Ghimire, Estil Heman, MD  bisoprolol  (ZEBETA ) 5 MG tablet Take 1 tablet (5 mg total) by mouth daily. 06/19/23   Gleason, Patt Boozer, PA-C  Cholecalciferol  (VITAMIN D -3 PO) Take 1 capsule by mouth daily.    [provider]  ELIQUIS  5 MG TABS tablet Take 1 tablet (5 mg total) by mouth 2 (two) times daily. 05/16/23   Ghimire, Estil Heman, MD  fluconazole  (DIFLUCAN ) 200 MG tablet Take 1 tablet  (200 mg total) by mouth daily for 10 days. 06/19/23 06/29/23  Gleason, Patt Boozer, PA-C  fluticasone  furoate-vilanterol (BREO ELLIPTA ) 100-25 MCG/ACT AEPB Inhale 1 puff into the lungs daily. Patient taking differently: Inhale 2 puffs into the lungs daily. 05/16/23   Ghimire, Estil Heman, MD  folic acid  (FOLVITE ) 1 MG tablet Take 1 tablet (1 mg total) by mouth daily. 05/16/23   Ghimire, Estil Heman, MD  gabapentin  (NEURONTIN ) 400 MG capsule Take 1 capsule (400 mg total) by mouth daily as needed (for nerve pain). Patient taking differently: Take 400 mg by mouth daily. 05/16/23   Ghimire, Estil Heman, MD  linaclotide  (LINZESS ) 145 MCG CAPS capsule Take 1 capsule (145 mcg total) by mouth daily before breakfast. 05/16/23   Ghimire, Estil Heman, MD  meloxicam (MOBIC) 15 MG tablet Take 15 mg by mouth daily. 06/05/23   [provider]  OXYGEN  Inhale 3 L/min into the lungs continuous.    [provider]  pantoprazole  (PROTONIX ) 40 MG tablet Take 1 tablet (40 mg total) by mouth daily as needed (acid reflux). Patient taking differently: Take 40 mg by mouth daily. 05/16/23   Ghimire, Estil Heman, MD  polyethylene glycol (MIRALAX  / GLYCOLAX ) 17 g packet Take 17 g by mouth daily. Patient taking differently: Take 17 g by mouth daily as needed for mild constipation or moderate constipation. 03/15/19   Zoanne Hinders, MD  pravastatin (PRAVACHOL) 10 MG tablet Take 10 mg by mouth  daily. 06/04/23   [provider]  predniSONE  (DELTASONE ) 20 MG tablet Take 2 tablets (40 mg total) by mouth daily with breakfast. 06/20/23   Deneise Finlay D, NP  senna (SENOKOT) 8.6 MG TABS tablet Take 1-2 tablets (8.6-17.2 mg total) by mouth daily. Patient taking differently: Take 2 tablets by mouth daily as needed for moderate constipation. 03/15/19   Zoanne Hinders, MD  SPIRIVA  RESPIMAT 2.5 MCG/ACT AERS Inhale 2 puffs into the lungs daily. 05/16/23   Ghimire, Estil Heman, MD  traZODone  (DESYREL ) 50 MG tablet Take 1 tablet (50 mg total) by mouth at  bedtime as needed for sleep. Patient taking differently: Take 50 mg by mouth daily. 05/16/23   Ghimire, Estil Heman, MD  VENTOLIN  HFA 108 (90 Base) MCG/ACT inhaler Inhale 1-2 puffs into the lungs every 6 (six) hours as needed for wheezing or shortness of breath Patient taking differently: Inhale 2 puffs into the lungs every 6 (six) hours as needed for wheezing or shortness of breath. 05/16/23   Ghimire, Estil Heman, MD      Allergies    Patient has no known allergies.    Review of Systems   Review of Systems  Unable to perform ROS: Mental status change    Physical Exam Updated Vital Signs BP 108/75   Pulse 61   Temp 98.5 F (36.9 C) (Oral)   Resp (!) 35   Wt 88 kg   SpO2 96%   BMI 27.06 kg/m  Physical Exam Vitals and nursing note reviewed.  Constitutional:      General: He is not in acute distress.    Appearance: He is well-developed. He is not toxic-appearing or diaphoretic.  HENT:     Head: Normocephalic and atraumatic.     Right Ear: External ear normal.     Left Ear: External ear normal.     Nose: Nose normal.     Mouth/Throat:     Mouth: Mucous membranes are moist.  Eyes:     Extraocular Movements: Extraocular movements intact.     Conjunctiva/sclera: Conjunctivae normal.  Cardiovascular:     Rate and Rhythm: Normal rate and regular rhythm.     Heart sounds: No murmur heard. Pulmonary:     Effort: Pulmonary effort is normal. No respiratory distress.     Breath sounds: Decreased air movement present. Decreased breath sounds present.  Abdominal:     General: There is no distension.     Palpations: Abdomen is soft.     Tenderness: There is no abdominal tenderness.  Musculoskeletal:        General: No swelling.     Cervical back: Normal range of motion and neck supple.     Right lower leg: Edema present.     Left lower leg: Edema present.  Skin:    General: Skin is warm and dry.     Coloration: Skin is not jaundiced or pale.  Neurological:     General: No focal  deficit present.     Mental Status: He is alert. He is disoriented.     ED Results / Procedures / Treatments   Labs (all labs ordered are listed, but only abnormal results are displayed) Labs Reviewed  COMPREHENSIVE METABOLIC PANEL WITH GFR - Abnormal; Notable for the following components:      Result Value   Chloride 87 (*)    CO2 39 (*)    Glucose, Bld 155 (*)    BUN 25 (*)    Calcium  8.7 (*)  ALT 50 (*)    Total Bilirubin 1.6 (*)    All other components within normal limits  BRAIN NATRIURETIC PEPTIDE - Abnormal; Notable for the following components:   B Natriuretic Peptide 569.9 (*)    All other components within normal limits  BLOOD GAS, VENOUS - Abnormal; Notable for the following components:   pH, Ven 7.23 (*)    pCO2, Ven >123 (*)    pO2, Ven <31 (*)    Bicarbonate 54.4 (*)    Acid-Base Excess 20.2 (*)    All other components within normal limits  CBC WITH DIFFERENTIAL/PLATELET - Abnormal; Notable for the following components:   WBC 17.4 (*)    MCV 105.3 (*)    Neutro Abs 16.1 (*)    Lymphs Abs 0.4 (*)    Abs Immature Granulocytes 0.10 (*)    All other components within normal limits  BLOOD GAS, VENOUS - Abnormal; Notable for the following components:   pCO2, Ven 103 (*)    pO2, Ven 49 (*)    Bicarbonate 46.2 (*)    Acid-Base Excess 14.2 (*)    All other components within normal limits  I-STAT CHEM 8, ED - Abnormal; Notable for the following components:   Chloride 88 (*)    BUN 28 (*)    Glucose, Bld 144 (*)    Calcium , Ion 1.04 (*)    TCO2 39 (*)    All other components within normal limits  RESP PANEL BY RT-PCR (RSV, FLU A&B, COVID)  RVPGX2  MRSA NEXT GEN BY PCR, NASAL  CULTURE, BLOOD (ROUTINE X 2)  CULTURE, BLOOD (ROUTINE X 2)  CULTURE, RESPIRATORY W GRAM STAIN  MAGNESIUM   CBC  CREATININE, SERUM  STREP PNEUMONIAE URINARY ANTIGEN  LEGIONELLA PNEUMOPHILA SEROGP 1 UR AG  CBC  BASIC METABOLIC PANEL WITH GFR  BLOOD GAS, ARTERIAL  MAGNESIUM    PHOSPHORUS    EKG EKG Interpretation Date/Time:  Sunday Jun 21 2023 00:34:15 EDT Ventricular Rate:  114 PR Interval:    QRS Duration:  95 QT Interval:  378 QTC Calculation: 500 R Axis:   269  Text Interpretation: Atrial fibrillation RSR' in V1 or V2, right VCD or RVH Inferior infarct, old Confirmed by Iva Mariner [phone]) on 06/21/2023 1:35:00 AM  Radiology DG Chest Port 1 View Result Date: 06/21/2023 CLINICAL DATA:  Initial evaluation for acute dyspnea. EXAM: PORTABLE CHEST 1 VIEW COMPARISON:  Prior radiograph from 06/15/2023 FINDINGS: Transverse heart size within normal limits. Mediastinal silhouette within normal limits. Aortic atherosclerosis. Changes of emphysema noted. Superimposed streaky and bibasilar opacities could reflect atelectasis or infiltrates. Suspected trace right pleural effusion. Left costophrenic angle incompletely visualized. No overt pulmonary edema. No pneumothorax. Few remotely healed left rib fractures noted. Osteopenia. No acute osseous finding. Osteoarthritic changes noted about the shoulders. IMPRESSION: 1. Streaky and bibasilar opacities, which could reflect atelectasis or infiltrates. 2. Suspected trace right pleural effusion. 3. Aortic Atherosclerosis (ICD10-I70.0) and Emphysema (ICD10-J43.9). Electronically Signed   By: Virgia Griffins M.D.   On: 06/21/2023 01:45    Procedures Procedures    Medications Ordered in ED Medications  methylPREDNISolone  sodium succinate (SOLU-MEDROL ) 125 mg/2 mL injection 125 mg (125 mg Intravenous Not Given 06/21/23 0106)  albuterol  (PROVENTIL ) (2.5 MG/3ML) 0.083% nebulizer solution (10 mg/hr Nebulization New Bag/Given 06/21/23 0107)  docusate sodium  (COLACE) capsule 100 mg (has no administration in time range)  polyethylene glycol (MIRALAX  / GLYCOLAX ) packet 17 g (has no administration in time range)  heparin  injection 5,000 Units (has no administration in  time range)  sodium chloride  0.9 % bolus 1,000 mL (has no  administration in time range)  ondansetron  (ZOFRAN ) injection 4 mg (has no administration in time range)  insulin  aspart (novoLOG ) injection 0-15 Units (has no administration in time range)  0.9 %  sodium chloride  infusion (has no administration in time range)  norepinephrine  (LEVOPHED ) 4mg  in (0.016 mg/mL) premix infusion (6 mcg/min Intravenous New Bag/Given 06/21/23 0347)  piperacillin -tazobactam (ZOSYN ) IVPB 3.375 g (has no administration in time range)  vancomycin  (VANCOREADY) IVPB 1750 mg/350 mL (has no administration in time range)  vancomycin  (VANCOCIN ) IVPB 1000 mg/200 mL premix (has no administration in time range)  ipratropium-albuterol  (DUONEB) 0.5-2.5 (3) MG/3ML nebulizer solution 3 mL (has no administration in time range)  methylPREDNISolone  sodium succinate (SOLU-MEDROL ) 40 mg/mL injection 40 mg (has no administration in time range)  furosemide  (LASIX ) injection 40 mg (40 mg Intravenous Given 06/21/23 0158)    ED Course/ Medical Decision Making/ A&P                                 Medical Decision Making Amount and/or Complexity of Data Reviewed Labs: ordered. Radiology: ordered.  Risk Prescription drug management. Decision regarding hospitalization.   This patient presents to the ED for concern of lethargy, this involves an extensive number of treatment options, and is a complaint that carries with it a high risk of complications and morbidity.  The differential diagnosis includes hypercarbia, polypharmacy, heart failure, infection, metabolic derangements   Co morbidities that complicate the patient evaluation  HTN, COPD, CAD, CHF, atrial fibrillation, prediabetes, seizures, PE   Additional history obtained:  Additional history obtained from EMS External records from outside source obtained and reviewed including EMR   Lab Tests:  I Ordered, and personally interpreted labs.  The pertinent results include: Normal kidney function, normal electrolytes.   Leukocytosis is present, likely from recent steroids.  Blood gas shows respiratory acidosis, consistent with COPD exacerbation.  BNP is elevated consistent with CHF exacerbation.   Imaging Studies ordered:  I ordered imaging studies including x-ray I independently visualized and interpreted imaging which showed streak bibasilar opacities consistent with atelectasis; likely trace right pleural effusion I agree with the radiologist interpretation   Cardiac Monitoring: / EKG:  The patient was maintained on a cardiac monitor.  I personally viewed and interpreted the cardiac monitored which showed an underlying rhythm of: Atrial fibrillation   Problem List / ED Course / Critical interventions / Medication management  Patient presents for shortness of breath and lethargy.  On arrival in the ED, patient is somnolent but arousable.  He is able to answer basic questions and follow commands.  He has swelling to bilateral distal lower extremities.  Distal lower and upper extremities are cool to the touch.  On auscultation of lungs, he has severely diminished air movement.  BiPAP and continuous breathing treatment was ordered.  Workup was initiated.  Patient refused BiPAP.  Heated high flow was placed.  He was able to maintain normal SpO2 with heated high flow.  Blood gas does show a respiratory acidosis.  When speaking with the patient about his refusal of BiPAP, he states that it was the BiPAP and intubation which caused him to be very weak and he is no longer able to walk.  Per chart review, he had tachycardia and hypoxia with ambulation during his PT evaluation.  This was why skilled nursing facility placement was recommended.  Patient  appeared to have very poor insight into his current condition.  His BNP is elevated and his blood pressures were soft, raising further concern for cold with heart failure.  Levophed  and Lasix  were ordered.  Repeat blood gas did show some improvement in his pCO2.  He did  require 6 mcg/min of Levophed .  I spoke with intensivist, Dr. Mason Sole, who will come see patient.  Patient was admitted for further management. I ordered medication including albuterol  and Solu-Medrol  for COPD exacerbation; Lasix  for diuresis; Levophed  for hypotension Reevaluation of the patient after these medicines showed that the patient improved I have reviewed the patients home medicines and have made adjustments as needed   Consultations Obtained:  I requested consultation with the intensivist, Dr. Mason Sole,  and discussed lab and imaging findings as well as pertinent plan - they recommend: Admission to ICU   Social Determinants of Health:  Previously living independently  CRITICAL CARE Performed by: Iva Mariner   Total critical care time: 32 minutes  Critical care time was exclusive of separately billable procedures and treating other patients.  Critical care was necessary to treat or prevent imminent or life-threatening deterioration.  Critical care was time spent personally by me on the following activities: development of treatment plan with patient and/or surrogate as well as nursing, discussions with consultants, evaluation of patient's response to treatment, examination of patient, obtaining history from patient or surrogate, ordering and performing treatments and interventions, ordering and review of laboratory studies, ordering and review of radiographic studies, pulse oximetry and re-evaluation of patient's condition.         Final Clinical Impression(s) / ED Diagnoses Final diagnoses:  Acute on chronic respiratory failure with hypoxia and hypercapnia (HCC)  COPD exacerbation (HCC)  Acute on chronic congestive heart failure, unspecified heart failure type (HCC)  Hypotension, unspecified hypotension type    Rx / DC Orders ED Discharge Orders     None         Iva Mariner, MD 06/21/23 [id number]

## 2023-06-21 NOTE — Progress Notes (Signed)
 Patient refusing HS blood sugar check. Elink notified and will continue to follow

## 2023-06-21 NOTE — ED Notes (Signed)
 Pt is A&O x 4. Refused Solumedrol. This RN notified Dr. Kermit Ped of refusal

## 2023-06-21 NOTE — Progress Notes (Signed)
 NAME:  Johnny Rodriguez, MRN:  [id number], DOB:  02/02/1951, LOS: 0 ADMISSION DATE:  06/21/2023, CONSULTATION DATE:  06/21/2023 REFERRING MD:  Iva Mariner, MD CHIEF COMPLAINT:  Shortness of breath   History of Present Illness:  [name] is a 73 year old male, daily smoker with history of alcohol abuse, atrial fibrillation, CAD, hypertension, pulmonary embolism and COPD who returns to the hospital with altered mental status and brought in via EMS. He was placed on Bipap for increased work of breathing and given nebulizer treatments, steroids and antibiotics.   He was recently discharged 06/19/23 as he requested to leave AMA. He has multiple admissions this year for COPD exacerbations and a progressive decline in his respiratory status. Per nursing, he does not wish to be here and does not wish to have bipap and aggressive therapies.  He has been admitted to the ICU.  Pertinent  Medical History   Past Medical History:  Diagnosis Date   AF (atrial fibrillation) (HCC)    Alcohol abuse    Angina pectoris    CAD (coronary artery disease) 12/15/2004   COPD (chronic obstructive pulmonary disease) (HCC)    Coronary artery disease    Heavy cigarette smoker    HTN (hypertension)    Idiopathic cardiomyopathy (HCC) 07/16/2009   Non compliance with medical treatment    Pulmonary embolism (HCC) 03/27/2022   Seizures (HCC)     Significant Hospital Events: Including procedures, antibiotic start and stop dates in addition to other pertinent events   5/11 admitted for COPD exacerbation, code status changed to DNR/DNI per patient wishes  Interim History / Subjective:   Patient's breathing is feeling better He is requesting to eat Discussion held regarding code status, he wishes to be DNR/DNI and pursue palliative care/hospice options  He is having progressive generalized weakness  Objective    Blood pressure (!) 104/59, pulse 78, temperature 98.8 F (37.1 C), temperature source Oral, resp.  rate (!) 26, weight 88 kg, SpO2 96%.    FiO2 (%):  [45 %] 45 %   Intake/Output Summary (Last 24 hours) at 06/21/2023 0800 Last data filed at 06/21/2023 4098 Gross per 24 hour  Intake 1087.91 ml  Output 375 ml  Net 712.91 ml   Filed Weights   06/21/23 0039  Weight: 88 kg    Examination: General: elderly male, chronically ill appearing, laying in bed HENT: [place]/AT, moist mucous membranes Lungs: scattered wheezing, poor air movement Cardiovascular: rrr, no murmurs Abdomen: soft, non-tender, BS+ Extremities: warm, 1+ edema Neuro: alert, moving all extremities, generalized weakness GU: n/a  Resolved Hospital Problem list     Assessment & Plan:   Acute on Chronic Hypercapnic and Hypoxemic Respiratory Failure Pneumonia - continue HHFNC - continue IV solumedrol 40mg  BID - continue duonebs q4hrs - continue vancomycin  and zosyn , descalate based on culture info - obtain sputum culture  Sepsis due to pneumonia - abx as above  Alcohol Abuse - potential for withdrawal - start moderate dose phenobarb taper - Precedex  ordered to be used as needed - thiamine   Acute metabolic encephalopathy - improved with breathing treatments and HHFNC - continue to monitor  Generalized Weakness Failure to thrive - palliative care consult  Macrocytic Anemia - start thiamine  and folate supplementation  Hyperglycemia - SSI   Best Practice (right click and "Reselect all SmartList Selections" daily)   Diet/type: Regular consistency (see orders) DVT prophylaxis prophylactic heparin   Pressure ulcer(s): pressure ulcer assessment deferred  to nursing evaluation/documentation GI prophylaxis: N/A Lines: N/A  Foley:  N/A Code Status:  DNR Last date of multidisciplinary goals of care discussion [code status updated today based on conversation with patient]  Labs   CBC: Recent Labs  Lab 06/15/23 1052 06/15/23 1058 06/16/23 0747 06/18/23 0431 06/19/23 0406 06/21/23 0059 06/21/23 0108  06/21/23 0443  WBC 23.1*  --  13.0* 12.3* 13.3* 17.4*  --  16.8*  NEUTROABS 19.0*  --   --   --   --  16.1*  --   --   HGB 14.8   < > 13.0 12.1* 11.8* 13.9 16.0 13.2  HCT 50.0   < > 39.5 37.8* 38.1* 46.1 47.0 44.0  MCV 106.6*  --  96.6 100.3* 101.9* 105.3*  --  105.5*  PLT 264  --  196 209 192 240  --  254   < > = values in this interval not displayed.    Basic Metabolic Panel: Recent Labs  Lab 06/16/23 0302 06/16/23 1702 06/17/23 1610 06/17/23 0715 06/18/23 0431 06/19/23 0406 06/21/23 0059 06/21/23 0108 06/21/23 0443  NA 138 139  --  138 135 138 138 135 137  K 4.0 3.8  --  4.0 3.6 4.1 4.3 4.3 4.8  CL 94* 95*  --  93* 95* 94* 87* 88* 87*  CO2 27 29  --  33* 34* 37* 39*  --  38*  GLUCOSE 177* 124*  --  205* 192* 168* 155* 144* 157*  BUN 26* 27*  --  33* 26* 23 25* 28* 27*  CREATININE 0.93 0.95  --  1.27* 0.71 0.89 0.79 0.90 0.74  CALCIUM  8.6* 9.0  --  8.9 8.2* 8.1* 8.7*  --  8.3*  MG 2.3 2.1 2.2  --  2.0 1.9 2.4  --  2.1  PHOS 3.0 5.3*  --   --  2.6  --   --   --  4.6   GFR: Estimated Creatinine Clearance: 90.2 mL/min (by C-G formula based on SCr of 0.74 mg/dL). Recent Labs  Lab 06/15/23 1059 06/15/23 1431 06/15/23 1729 06/16/23 0747 06/16/23 9604 06/18/23 0431 06/19/23 0406 06/21/23 0059 06/21/23 0443  WBC  --   --   --    < >  --  12.3* 13.3* 17.4* 16.8*  LATICACIDVEN 2.0* 2.4* 4.7*  --  2.0*  --   --   --   --    < > = values in this interval not displayed.    Liver Function Tests: Recent Labs  Lab 06/21/23 0059  AST 26  ALT 50*  ALKPHOS 71  BILITOT 1.6*  PROT 6.9  ALBUMIN  3.9   No results for input(s): "LIPASE", "AMYLASE" in the last 168 hours. No results for input(s): "AMMONIA" in the last 168 hours.  ABG    Component Value Date/Time   PHART 7.45 06/16/2023 0512   PCO2ART 49 (H) 06/16/2023 0512   PO2ART 126 (H) 06/16/2023 0512   HCO3 50.0 (H) 06/21/2023 0504   TCO2 39 (H) 06/21/2023 0108   ACIDBASEDEF 4.0 (H) 03/29/2017 1604   O2SAT 69.6  06/21/2023 0504     Coagulation Profile: No results for input(s): "INR", "PROTIME" in the last 168 hours.  Cardiac Enzymes: No results for input(s): "CKTOTAL", "CKMB", "CKMBINDEX", "TROPONINI" in the last 168 hours.  HbA1C: HbA1c, POC (prediabetic range)  Date/Time Value Ref Range Status  08/15/2022 01:34 PM 6.3 5.7 - 6.4 % Final   HbA1c, POC (controlled diabetic range)  Date/Time Value Ref Range Status  08/15/2022 03:08 PM 6.3 0.0 -  7.0 % Final   Hgb A1c MFr Bld  Date/Time Value Ref Range Status  05/06/2023 05:14 PM 6.1 (H) 4.8 - 5.6 % Final    Comment:    (NOTE) Pre diabetes:          5.7%-6.4%  Diabetes:              >6.4%  Glycemic control for   <7.0% adults with diabetes     CBG: Recent Labs  Lab 06/18/23 1608 06/18/23 2023 06/18/23 2329 06/19/23 0448 06/21/23 0537  GLUCAP 268* 235* 217* 161* 147*     Critical care time: 45 minutes    Duaine German, MD [facility] Office: [phone]   See Amion for personal pager PCCM on call pager [phone] until 7pm. Please call Elink 7p-7a. [phone]

## 2023-06-21 NOTE — ED Triage Notes (Signed)
 Patient BIB [facility] c/o sob.  Patient recently d/c'd from hospital for same, EMS reports oxygen  sats at home were 70% on home 02 at 2L, they gave 3-4 duonebs and placed patient on 12 L simple mask.  Patient in obvious distress upon arrival, refusing BiPap.

## 2023-06-22 DIAGNOSIS — Z7189 Other specified counseling: Secondary | ICD-10-CM | POA: Diagnosis not present

## 2023-06-22 DIAGNOSIS — G9341 Metabolic encephalopathy: Secondary | ICD-10-CM

## 2023-06-22 DIAGNOSIS — F1010 Alcohol abuse, uncomplicated: Secondary | ICD-10-CM | POA: Diagnosis not present

## 2023-06-22 DIAGNOSIS — R739 Hyperglycemia, unspecified: Secondary | ICD-10-CM

## 2023-06-22 DIAGNOSIS — J441 Chronic obstructive pulmonary disease with (acute) exacerbation: Secondary | ICD-10-CM | POA: Diagnosis not present

## 2023-06-22 DIAGNOSIS — D649 Anemia, unspecified: Secondary | ICD-10-CM | POA: Diagnosis not present

## 2023-06-22 DIAGNOSIS — Z515 Encounter for palliative care: Secondary | ICD-10-CM | POA: Diagnosis not present

## 2023-06-22 DIAGNOSIS — J9621 Acute and chronic respiratory failure with hypoxia: Secondary | ICD-10-CM | POA: Diagnosis not present

## 2023-06-22 DIAGNOSIS — R4589 Other symptoms and signs involving emotional state: Secondary | ICD-10-CM | POA: Diagnosis not present

## 2023-06-22 LAB — BASIC METABOLIC PANEL WITH GFR
Anion gap: 9 (ref 5–15)
BUN: 21 mg/dL (ref 8–23)
CO2: 39 mmol/L — ABNORMAL HIGH (ref 22–32)
Calcium: 8.1 mg/dL — ABNORMAL LOW (ref 8.9–10.3)
Chloride: 88 mmol/L — ABNORMAL LOW (ref 98–111)
Creatinine, Ser: 0.68 mg/dL (ref 0.61–1.24)
GFR, Estimated: >60 mL/min (ref >60)
Glucose, Bld: 201 mg/dL — ABNORMAL HIGH (ref 70–99)
Potassium: 4.3 mmol/L (ref 3.5–5.1)
Sodium: 136 mmol/L (ref 135–145)

## 2023-06-22 LAB — CBC WITH DIFFERENTIAL/PLATELET
Abs Immature Granulocytes: 0.06 10*3/uL (ref 0.00–0.07)
Basophils Absolute: 0 10*3/uL (ref 0.0–0.1)
Basophils Relative: 0 %
Eosinophils Absolute: 0 10*3/uL (ref 0.0–0.5)
Eosinophils Relative: 0 %
HCT: 38.4 % — ABNORMAL LOW (ref 39.0–52.0)
Hemoglobin: 11.5 g/dL — ABNORMAL LOW (ref 13.0–17.0)
Immature Granulocytes: 0 %
Lymphocytes Relative: 2 %
Lymphs Abs: 0.2 10*3/uL — ABNORMAL LOW (ref 0.7–4.0)
MCH: 31.9 pg (ref 26.0–34.0)
MCHC: 29.9 g/dL — ABNORMAL LOW (ref 30.0–36.0)
MCV: 106.4 fL — ABNORMAL HIGH (ref 80.0–100.0)
Monocytes Absolute: 0.3 10*3/uL (ref 0.1–1.0)
Monocytes Relative: 2 %
Neutro Abs: 13 10*3/uL — ABNORMAL HIGH (ref 1.7–7.7)
Neutrophils Relative %: 96 %
Platelets: 198 10*3/uL (ref 150–400)
RBC: 3.61 MIL/uL — ABNORMAL LOW (ref 4.22–5.81)
RDW: 15.3 % (ref 11.5–15.5)
WBC: 13.5 10*3/uL — ABNORMAL HIGH (ref 4.0–10.5)
nRBC: 0 % (ref 0.0–0.2)

## 2023-06-22 LAB — GLUCOSE, CAPILLARY
Glucose-Capillary: 258 mg/dL — ABNORMAL HIGH (ref 70–99)
Glucose-Capillary: 298 mg/dL — ABNORMAL HIGH (ref 70–99)
Glucose-Capillary: 193 mg/dL — ABNORMAL HIGH (ref 70–99)
Glucose-Capillary: 203 mg/dL — ABNORMAL HIGH (ref 70–99)
Glucose-Capillary: 166 mg/dL — ABNORMAL HIGH (ref 70–99)

## 2023-06-22 MED ORDER — ASPIRIN 81 MG PO TBEC
81.0000 mg | DELAYED_RELEASE_TABLET | Freq: Every day | ORAL | Status: DC
  Administered 2023-06-22 – 2023-06-23 (×2): 81 mg via ORAL
  Filled 2023-06-22 (×2): qty 1

## 2023-06-22 MED ORDER — POTASSIUM CHLORIDE CRYS ER 20 MEQ PO TBCR
40.0000 meq | EXTENDED_RELEASE_TABLET | Freq: Two times a day (BID) | ORAL | Status: AC
  Administered 2023-06-22 (×2): 40 meq via ORAL
  Filled 2023-06-22 (×2): qty 2

## 2023-06-22 MED ORDER — PRAVASTATIN SODIUM 20 MG PO TABS
10.0000 mg | ORAL_TABLET | Freq: Every day | ORAL | Status: DC
Start: 2023-06-22 — End: 2023-06-23

## 2023-06-22 MED ORDER — FUROSEMIDE 10 MG/ML IJ SOLN
40.0000 mg | Freq: Four times a day (QID) | INTRAMUSCULAR | Status: AC
Start: 2023-06-22 — End: 2023-06-23
  Administered 2023-06-22 – 2023-06-23 (×3): 40 mg via INTRAVENOUS
  Filled 2023-06-22 (×3): qty 4

## 2023-06-22 MED ORDER — PREDNISONE 20 MG PO TABS
40.0000 mg | ORAL_TABLET | Freq: Every day | ORAL | Status: DC
  Administered 2023-06-23: 40 mg via ORAL
  Filled 2023-06-22: qty 2

## 2023-06-22 MED ORDER — SODIUM CHLORIDE 0.9 % IV SOLN
2.0000 g | INTRAVENOUS | Status: DC
  Administered 2023-06-22: 2 g via INTRAVENOUS
  Filled 2023-06-22: qty 20

## 2023-06-22 MED ORDER — INSULIN ASPART 100 UNIT/ML IJ SOLN
0.0000 [IU] | Freq: Three times a day (TID) | INTRAMUSCULAR | Status: DC
  Administered 2023-06-22: 2 [IU] via SUBCUTANEOUS

## 2023-06-22 MED ORDER — THIAMINE HCL 100 MG/ML IJ SOLN
100.0000 mg | Freq: Every day | INTRAMUSCULAR | Status: DC
Start: 2023-06-25 — End: 2023-06-23

## 2023-06-22 MED ORDER — SODIUM CHLORIDE 0.9 % IV SOLN
500.0000 mg | INTRAVENOUS | Status: DC
  Administered 2023-06-22: 500 mg via INTRAVENOUS
  Filled 2023-06-22 (×2): qty 5

## 2023-06-22 NOTE — Progress Notes (Signed)
 Daily Progress Note   Patient Name: Johnny Rodriguez       Date: 06/22/2023 DOB: 11/16/1950  Age: 73 y.o. MRN#: [id number] Attending Physician: Joesph Mussel, DO Primary Care Physician: Pcp, No Admit Date: 06/21/2023 Length of Stay: 1 day  Reason for Consultation/Follow-up: Establishing goals of care  Subjective:   CC: Patient sitting up in bedside chair.  Following up regarding complex medical decision making.  Subjective:  Reviewed EMR prior to presenting to bedside.  Able to review recent physical therapy/Occupational Therapy and PCCM note.  Discussed care with PCCM provider to coordinate care as well.  Discussed updates with bedside RN.  Patient receiving HFNC at 5 L/min.  Discussed care with chaplain for emotional support.  Presented to bedside to see patient.  Patient seen sitting up in bedside chair.  Again introduced myself as a member of the palliative medicine team.  Patient able to engage in some conversation though incredibly tangential with thoughts and difficulties processing entirety of discussion.  Patient noted that he had discussed with PCCM provider plan to get medicine for his fluid overload to become more functional.  Tried to spend time discussing medication management and patient's underlying medical diseases.  Patient had difficulty connecting this information.  Patient stating that he wants to continue to regain strength while also stating at the same time he "wants to go home".  Have explained why patient needs to continue with medical management in order to regain strength so that he can eventually go home.  Patient not able to engage in complex medical decision making at this time.  During visit chaplain came to visit with patient.  This provider excused herself so they could meet.  Palliative medicine team to continue following along with patient's medical journey.  Objective:   Vital Signs:  BP 118/64 (BP Location: Left Arm)   Pulse 87   Temp 98.1 F (36.7  C) (Oral)   Resp (!) 24   Ht 5\' 11"  (1.803 m)   Wt 74.2 kg Comment: includes some of his belongings, he refused to let nurse take them  SpO2 100%   BMI 22.81 kg/m   Physical Exam: General: Awake, frustrated, chronically ill-appearing Cardiovascular: Tachycardia noted, anasarca in all extremities Respiratory: on HFNC Skin: Multiple ecchymoses on bilateral lower extremities Neuro: Awake, tangential in thoughts at times  Imaging: I personally reviewed recent imaging.   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  Patient had recently been discharged from the hospital on 06/19/2023 for management of same concern and had requested to leave AMA during that hospitalization.  Patient has had multiple admissions for COPD exacerbations and progressive decline in his respiratory status.  Patient has been resistant to multiple medical interventions such as BiPAP.  Patient did require intubation previously.  Patient now having likely pneumonia as well.  Palliative medicine team consulted to assist with complex medical decision making.   Recommendations/Plan: # Complex medical decision making/goals of care:  - Discussion had with patient as detailed above in HPI.  Patient is tangential in thoughts.  Patient having difficulty connecting his medical illnesses to his lifestyle..  Agree with PCCM provider about concerns of underlying Warnicke's encephalopathy or other neurological condition.  Difficult situation for patient since he has no family,  only friend noted in EMR.  Patient noted that he is willing to continue with current medical interventions to regain his strength though patient quickly states also wanting to go home at this  time.  Continuing appropriate medical management at this time.  Agree with DNR/DNI status which would be appropriate for patient.  Palliative medicine team will engage in conversations as able and appropriate moving forward.   -  Code Status: Limited: Do not attempt resuscitation (DNR) -DNR-LIMITED -Do Not Intubate/DNI   # Psychosocial Support:  - Support System: Johnny Rodriguez   - May need to consider TOC involvement about making sure no further next of kin with patient's mentation.  # Discharge Planning: To Be Determined  Discussed with: patient, RN, PCCM provider   Thank you for allowing the palliative care team to participate in the care Johnny Rodriguez.  Johnny Libel, DO Palliative Care Provider PMT # [phone]  If patient remains symptomatic despite maximum doses, please call PMT at [phone] between 0700 and 1900. Outside of these hours, please call attending, as PMT does not have night coverage.  Personally spent 35 minutes in patient care including extensive chart review (labs, imaging, progress/consult notes, vital signs), medically appropraite exam, discussed with treatment team, education to patient, family, and staff, documenting clinical information, medication review and management, coordination of care, and available advanced directive documents.

## 2023-06-22 NOTE — Plan of Care (Signed)
  Problem: Health Behavior/Discharge Planning: Goal: Ability to identify and utilize available resources and services will improve Outcome: Not Progressing Goal: Ability to manage health-related needs will improve Outcome: Not Progressing   Problem: Metabolic: Goal: Ability to maintain appropriate glucose levels will improve Outcome: Not Progressing  Patient non compliant with cares

## 2023-06-22 NOTE — Progress Notes (Signed)
 NAME:  Johnny Rodriguez, MRN:  [id number], DOB:  1950-04-22, LOS: 1 ADMISSION DATE:  06/21/2023, CONSULTATION DATE:  06/21/2023 REFERRING MD:  Iva Mariner, MD CHIEF COMPLAINT:  Shortness of breath   History of Present Illness:  Johnny Rodriguez is a 73 year old male, daily smoker with history of alcohol abuse, atrial fibrillation, CAD, hypertension, pulmonary embolism and COPD who returns to the hospital with altered mental status and brought in via EMS. He was placed on Bipap for increased work of breathing and given nebulizer treatments, steroids and antibiotics.   He was recently discharged 06/19/23 as he requested to leave AMA. He has multiple admissions this year for COPD exacerbations and a progressive decline in his respiratory status. Per nursing, he does not wish to be here and does not wish to have bipap and aggressive therapies.  He has been admitted to the ICU.  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  Affect a little off, oxygen  requirements improving  Objective    Blood pressure 110/62, pulse (!) 105, temperature  97.7 F (36.5 C), temperature source Axillary, resp. rate (!) 25, height 5\' 11"  (1.803 m), weight 74.2 kg, SpO2 94%.    FiO2 (%):  [41 %] 41 %   Intake/Output Summary (Last 24 hours) at 06/22/2023 1013 Last data filed at 06/22/2023 0843 Gross per 24 hour  Intake 1532.86 ml  Output 1475 ml  Net 57.86 ml   Filed Weights   06/21/23 0039 06/21/23 0800  Weight: 88 kg 74.2 kg    Examination: General This is a chronically ill-appearing 73 year old male patient currently resting in the chair he is in no acute distress speaking full sentences HEENT normocephalic atraumatic no obvious jugular venous distention Pulmonary: Diminished bilaterally, no wheezing, currently 7 L/min high flow oxygen  no accessory use, speaking full sentences Portable chest x-ray personally reviewed showed right basilar infiltrate Cardiac: Irregular irregular.  Atrial fibrillation on telemetry He has scattered multiple areas of ecchymosis, he has 4+ pitting edema in both lower extremities pulses are palpable he does have some red discoloration bilaterally Abdomen soft not tender tolerating diet GU voids Neuro he is awake he is oriented x 2-3, but easily distracted, at times confused and requires reorientation he has generalized weakness but no focal deficits appreciated    Resolved problem list  Severe sepsis Assessment & Plan:   Acute on Chronic Hypercapnic and Hypoxemic Respiratory Failure due  to pneumonia, complicated by pulmonary edema Plan Continue to wean FiO2 for saturations greater than 88% Continuing systemic steroids, he is taking orals so we will change him to prednisone  40, and  complete total of 7 days Schedule DuoNebs for now Incentive spirometry and flutter Discontinue vancomycin  as PCR is negative, Today is day 3 antibiotics, will change to ceftriaxone  and complete 5 total days of therapy IV Lasix  today A.m. chest x-ray  HFrEF Plan IV lasix  Resume bisoprolol    Acute metabolic encephalopathy  with history of alcohol abuse - I wonder about an element of Warnicke's, it is not clear how much is acute versus chronic  Plan  Continue moderate dose phenobarb taper Precedex  ordered to be used as needed Dose thiamine   History of atrial fibrillation Plan Continuing bisoprolol  and apixaban  Continue telemetry monitoring  Generalized Weakness Failure to thrive Plan palliative care consult  Macrocytic Anemia No evidence of bleeding Plan thiamine  and folate supplementation  Hyperglycemia Plan Ssi goal 140-180  decreasing steroids, hopefully this will help with glycemic control  Best Practice (right click and "Reselect all SmartList Selections" daily)   Diet/type: Regular consistency (see orders) DVT prophylaxis prophylactic heparin   Pressure ulcer(s): pressure ulcer assessment deferred  to nursing evaluation/documentation GI prophylaxis: N/A Lines: N/A Foley:  N/A Code Status:  DNR Last date of multidisciplinary goals of care discussion [code status updated today based on conversation with patient]    Critical care time: NA

## 2023-06-22 NOTE — Evaluation (Signed)
 Occupational Therapy Evaluation Patient Details Name: Johnny Rodriguez MRN: [id number] DOB: 03-25-1950 Today's Date: 06/22/2023   History of Present Illness   Patient is a 73 year old male who presented with AMS and SOB. Patient was admitted with acute on chronic hypercapnic and hypoxemic respiratory failure, pneumonia, and sepsis. Patient noted to have recent d/c from hosptial with similar concern. PMH: alcohol abuse, tobacco abuse, a fib, pulmonary embolism, COPD, chronic pain, HFrEF 35-40%     Clinical Impressions Patient is a 73 year old male who was admitted for above. Patient reported living alone and being independent. Patient has a history of declining medical interventions and leaving AMA per chart review. Patient was agitated during session with OT questions or attempts to engage patient in activities ( donning clean larger socks, toileting, and bathing). Patient is unsteady on feet with increased time and physical A needed for balance with turns with RW. Patient dismissed therapist from room after grabbing socks and declining to put them on. OT to continue to follow with guarded rehab potential for one more session. If patient remains agitated and disagreeable OT to sign off.      If plan is discharge home, recommend the following:   A little help with walking and/or transfers;Assistance with cooking/housework;Direct supervision/assist for medications management;Assist for transportation;Help with stairs or ramp for entrance;A lot of help with bathing/dressing/bathroom;Supervision due to cognitive status     Functional Status Assessment   Patient has had a recent decline in their functional status and/or demonstrates limited ability to make significant improvements in function in a reasonable and predictable amount of time       Precautions/Restrictions   Precautions Precautions: Fall;Other (comment) Recall of Precautions/Restrictions: Intact Precaution/Restrictions  Comments: watch sats and  HR Restrictions Weight Bearing Restrictions Per Provider Order: No     Mobility Bed Mobility               General bed mobility comments: patient was up in recliner and returned to the same.        Balance Overall balance assessment: Needs assistance         Standing balance support: Reliant on assistive device for balance, Bilateral upper extremity supported Standing balance-Leahy Scale: Poor         ADL either performed or assessed with clinical judgement   ADL Overall ADL's : Needs assistance/impaired Eating/Feeding: Modified independent Eating/Feeding Details (indicate cue type and reason): in room this AM.   Grooming Details (indicate cue type and reason): declined to attempt   Upper Body Bathing Details (indicate cue type and reason): declined to attempt   Lower Body Bathing Details (indicate cue type and reason): declined to attempt   Upper Body Dressing Details (indicate cue type and reason): declined to attempt   Lower Body Dressing Details (indicate cue type and reason): patient asked for therapsit to don socks for him. patient was educated that OT role was to see what he could do for himself and to at least try. patient grabbed socks from therapist and then asked for therapist to leave. Toilet Transfer: Minimal assistance;Ambulation;Rolling walker (2 wheels) Toilet Transfer Details (indicate cue type and reason): patient with poor activity tolerance. patent needing +2 for safety with patient off balance and crossing feet with turns. Toileting- Clothing Manipulation and Hygiene: Bed level;Total assistance Toileting - Clothing Manipulation Details (indicate cue type and reason): patient audibly voiding gas during session but continuing to decline to use bathroom.  Vision    Had glassed on during session. Unable to formally assess vision due to patients agitation with questions.             Pertinent  Vitals/Pain Pain Assessment Pain Assessment: No/denies pain     Extremity/Trunk Assessment Upper Extremity Assessment Upper Extremity Assessment: Defer to OT evaluation   Lower Extremity Assessment Lower Extremity Assessment: LLE deficits/detail;RLE deficits/detail RLE Deficits / Details: knee ext 4/5, edema noted BLEs RLE Sensation: decreased light touch LLE Deficits / Details: knee ext 4/5, edema noted BLEs LLE Sensation: decreased light touch   Cervical / Trunk Assessment Cervical / Trunk Assessment: Normal   Communication Communication Communication: No apparent difficulties   Cognition Arousal: Alert Behavior During Therapy: Agitated, Impulsive Cognition: No family/caregiver present to determine baseline, Cognition impaired     Awareness: Online awareness impaired, Intellectual awareness impaired   Attention impairment (select first level of impairment): Selective attention Executive functioning impairment (select all impairments): Reasoning, Problem solving OT - Cognition Comments: patient was short with all responses during session. patient easily agitated with questions and dismissed therapist from room.                                    Home Living Family/patient expects to be discharged to:: Private residence Living Arrangements: Alone Available Help at Discharge: Friend(s);Available PRN/intermittently Type of Home: Apartment Home Access: Level entry     Home Layout: One level     Bathroom Shower/Tub: Tub/shower unit;Walk-in shower   Bathroom Toilet: Standard Bathroom Accessibility: Yes   Home Equipment: Agricultural consultant (2 wheels);Rollator (4 wheels);Wheelchair - power;Shower seat;Grab bars - tub/shower   Additional Comments: Pt lives alone, has a friend who can help occasionally.      Prior Functioning/Environment Prior Level of Function : Independent/Modified Independent             Mobility Comments: uses power wc and standard;  has a rollator and uses that as well. Unsure exactly as pt getting agitated with questions ADLs Comments: reports he drives his powerchair to get his groceries; does his bahting and dressing    OT Problem List: Decreased strength;Decreased activity tolerance;Impaired balance (sitting and/or standing);Decreased safety awareness;Cardiopulmonary status limiting activity   OT Treatment/Interventions: Self-care/ADL training;Therapeutic exercise;Energy conservation;DME and/or AE instruction;Therapeutic activities;Patient/family education;Balance training      OT Goals(Current goals can be found in the care plan section)   Acute Rehab OT Goals Patient Stated Goal: none stated OT Goal Formulation: Patient unable to participate in goal setting Time For Goal Achievement: 07/06/23 Potential to Achieve Goals: Fair   OT Frequency:  Min 2X/week       AM-PAC OT "6 Clicks" Daily Activity     Outcome Measure Help from another person eating meals?: None Help from another person taking care of personal grooming?: A Little Help from another person toileting, which includes using toliet, bedpan, or urinal?: Total Help from another person bathing (including washing, rinsing, drying)?: A Lot Help from another person to put on and taking off regular upper body clothing?: A Little Help from another person to put on and taking off regular lower body clothing?: Total 6 Click Score: 14   End of Session Equipment Utilized During Treatment: Gait belt;Rolling walker (2 wheels);Oxygen  (8L/min) Nurse Communication: Mobility status (patient's dismissal of therapy.)  Activity Tolerance: Patient limited by fatigue Patient left: in chair;with call bell/phone within reach;with chair alarm set  OT Visit Diagnosis: Unsteadiness  on feet (R26.81);Other abnormalities of gait and mobility (R26.89);Muscle weakness (generalized) (M62.81);Other symptoms and signs involving cognitive function                Time:  5284-1324 OT Time Calculation (min): 11 min Charges:  OT General Charges $OT Visit: 1 Visit OT Evaluation $OT Eval Low Complexity: 1 Low  Perla Echavarria OTR/L, MS Acute Rehabilitation Department Office# [phone]   Jame Maze 06/22/2023, 10:26 AM

## 2023-06-22 NOTE — Progress Notes (Signed)
   06/22/23 1220  Spiritual Encounters  Type of Visit Follow up  Care provided to: Patient  Referral source Physician  Reason for visit Routine spiritual support   Per referral from [name], DO, palliative care, I visited with [name].  [name] welcomed my visit. He showed some signs of confusion and agitation when nurse tech came to check blood sugar.  I provided relational support and attempted to explore nature of [name] objections and apparent lack of trust of care. I sought to affirm staff interventions while honoring patient. Will continue to visit and offer support. Andrika Peraza L. Minetta Aly, M.Div [phone]

## 2023-06-22 NOTE — Evaluation (Signed)
 Physical Therapy Evaluation Patient Details Name: Johnny Rodriguez MRN: [id number] DOB: 1950/03/14 Today's Date: 06/22/2023  History of Present Illness  Patient is a 73 year old male who presented with AMS and SOB. Patient was admitted with acute on chronic hypercapnic and hypoxemic respiratory failure, pneumonia, and sepsis. Patient noted to have recent d/c from hosptial with similar concern. 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 Pt ambulated 15' + 35' with RW, distance limited by fatigue, SpO2 91% on 8L HFNC. Pt has decreased safety awareness and is at risk for falls.  Pt currently with functional limitations due to the deficits listed below (see PT Problem List). Pt will benefit from acute skilled PT to increase their independence and safety with mobility to allow discharge.           If plan is discharge home, recommend the following: Assistance with cooking/housework;Help with stairs or ramp for entrance;Assist for transportation;A lot of help with walking and/or transfers;A lot of help with bathing/dressing/bathroom;Supervision due to cognitive status   Can travel by private vehicle   No    Equipment Recommendations None recommended by PT  Recommendations for Other Services       Functional Status Assessment Patient has had a recent decline in their functional status and demonstrates the ability to make significant improvements in function in a reasonable and predictable amount of time.     Precautions / Restrictions        Mobility  Bed Mobility               General bed mobility comments: up in recliner    Transfers Overall transfer level: Needs assistance Equipment used: Rolling walker (2 wheels) Transfers: Sit to/from Stand Sit to Stand: Contact guard assist           General transfer comment: VCs hand placement, increased time/effort, sit to  stand x 2    Ambulation/Gait Ambulation/Gait assistance: Contact guard assist Gait Distance (Feet): 35 Feet Assistive device: Rolling walker (2 wheels) Gait Pattern/deviations: Step-through pattern, Decreased stride length Gait velocity: decreased     General Gait Details: SpO2 91% on 8L O2 HFNC walking; unsteady, scissoring x 1, distance limited by fatigue, uncontrolled descent to the chair  Stairs            Wheelchair Mobility     Tilt Bed    Modified Rankin (Stroke Patients Only)       Balance Overall balance assessment: Needs assistance   Sitting balance-Leahy Scale: Fair     Standing balance support: Reliant on assistive device for balance, Bilateral upper extremity supported, During functional activity Standing balance-Leahy Scale: Poor                               Pertinent Vitals/Pain Pain Assessment Pain Assessment: No/denies pain    Home Living Family/patient expects to be discharged to:: Private residence Living Arrangements: Alone Available Help at Discharge: Friend(s);Available PRN/intermittently Type of Home: Apartment Home Access: Level entry       Home Layout: One level Home Equipment: Agricultural consultant (2 wheels);Rollator (4 wheels);Wheelchair - power;Shower seat;Grab bars - tub/shower Additional Comments: Pt lives alone, has a friend who can help occasionally.    Prior Function Prior Level of Function : Independent/Modified Independent             Mobility Comments: uses power wc  and standard; has a rollator and uses that as well. Unsure exactly as pt getting agitated with questions ADLs Comments: reports he drives his powerchair to get his groceries; does his bahting and dressing     Extremity/Trunk Assessment   Upper Extremity Assessment Upper Extremity Assessment: Defer to OT evaluation    Lower Extremity Assessment Lower Extremity Assessment: LLE deficits/detail;RLE deficits/detail RLE Deficits / Details:  knee ext 4/5, edema noted BLEs RLE Sensation: decreased light touch LLE Deficits / Details: knee ext 4/5, edema noted BLEs LLE Sensation: decreased light touch    Cervical / Trunk Assessment Cervical / Trunk Assessment: Normal  Communication   Communication Communication: No apparent difficulties    Cognition Arousal: Alert Behavior During Therapy: Agitated, Impulsive                           PT - Cognition Comments: very poor safety awareness         Cueing Cueing Techniques: Verbal cues, Visual cues, Tactile cues     General Comments      Exercises     Assessment/Plan    PT Assessment Patient needs continued PT services  PT Problem List Decreased strength;Decreased activity tolerance;Decreased balance;Decreased mobility;Decreased knowledge of use of DME;Decreased safety awareness;Cardiopulmonary status limiting activity       PT Treatment Interventions DME instruction;Gait training;Functional mobility training;Therapeutic activities;Therapeutic exercise;Cognitive remediation;Patient/family education    PT Goals (Current goals can be found in the Care Plan section)  Acute Rehab PT Goals Patient Stated Goal: return home PT Goal Formulation: With patient Time For Goal Achievement: 07/06/23 Potential to Achieve Goals: Fair    Frequency Min 2X/week     Co-evaluation               AM-PAC PT "6 Clicks" Mobility  Outcome Measure Help needed turning from your back to your side while in a flat bed without using bedrails?: A Little Help needed moving from lying on your back to sitting on the side of a flat bed without using bedrails?: A Little Help needed moving to and from a bed to a chair (including a wheelchair)?: A Little Help needed standing up from a chair using your arms (e.g., wheelchair or bedside chair)?: A Little Help needed to walk in hospital room?: A Little Help needed climbing 3-5 steps with a railing? : A Lot 6 Click Score: 17     End of Session Equipment Utilized During Treatment: Gait belt;Oxygen  Activity Tolerance: Patient limited by fatigue Patient left: in chair;with call bell/phone within reach;with chair alarm set Nurse Communication: Mobility status PT Visit Diagnosis: Muscle weakness (generalized) (M62.81);Other abnormalities of gait and mobility (R26.89);Unsteadiness on feet (R26.81);Difficulty in walking, not elsewhere classified (R26.2)    Time: 2130-8657 PT Time Calculation (min) (ACUTE ONLY): 21 min   Charges:   PT Evaluation $PT Eval Moderate Complexity: 1 Mod   PT General Charges $$ ACUTE PT VISIT: 1 Visit         Daymon Evans PT 06/22/2023  Acute Rehabilitation Services  Office [phone]

## 2023-06-23 ENCOUNTER — Inpatient Hospital Stay (HOSPITAL_COMMUNITY)

## 2023-06-23 ENCOUNTER — Other Ambulatory Visit (HOSPITAL_COMMUNITY): Payer: Self-pay

## 2023-06-23 DIAGNOSIS — J441 Chronic obstructive pulmonary disease with (acute) exacerbation: Secondary | ICD-10-CM

## 2023-06-23 DIAGNOSIS — I5023 Acute on chronic systolic (congestive) heart failure: Secondary | ICD-10-CM | POA: Diagnosis not present

## 2023-06-23 DIAGNOSIS — I5020 Unspecified systolic (congestive) heart failure: Secondary | ICD-10-CM

## 2023-06-23 LAB — CBC
HCT: 37.4 % — ABNORMAL LOW (ref 39.0–52.0)
Hemoglobin: 11.5 g/dL — ABNORMAL LOW (ref 13.0–17.0)
MCH: 32.3 pg (ref 26.0–34.0)
MCHC: 30.7 g/dL (ref 30.0–36.0)
MCV: 105.1 fL — ABNORMAL HIGH (ref 80.0–100.0)
Platelets: 231 10*3/uL (ref 150–400)
RBC: 3.56 MIL/uL — ABNORMAL LOW (ref 4.22–5.81)
RDW: 14.9 % (ref 11.5–15.5)
WBC: 14.2 10*3/uL — ABNORMAL HIGH (ref 4.0–10.5)
nRBC: 0 % (ref 0.0–0.2)

## 2023-06-23 LAB — LEGIONELLA PNEUMOPHILA SEROGP 1 UR AG: L. pneumophila Serogp 1 Ur Ag: NEGATIVE

## 2023-06-23 LAB — CREATININE, SERUM
Creatinine, Ser: 0.66 mg/dL (ref 0.61–1.24)
GFR, Estimated: >60 mL/min (ref >60)

## 2023-06-23 LAB — GLUCOSE, CAPILLARY: Glucose-Capillary: 109 mg/dL — ABNORMAL HIGH (ref 70–99)

## 2023-06-23 MED ORDER — IPRATROPIUM-ALBUTEROL 0.5-2.5 (3) MG/3ML IN SOLN
3.0000 mL | Freq: Four times a day (QID) | RESPIRATORY_TRACT | Status: DC
  Administered 2023-06-23 (×2): 3 mL via RESPIRATORY_TRACT
  Filled 2023-06-23 (×4): qty 3

## 2023-06-23 MED ORDER — FUROSEMIDE 40 MG PO TABS
40.0000 mg | ORAL_TABLET | Freq: Every day | ORAL | 3 refills | Status: AC
  Filled 2023-06-23: qty 30, 30d supply, fill #0

## 2023-06-23 MED ORDER — IPRATROPIUM-ALBUTEROL 0.5-2.5 (3) MG/3ML IN SOLN: 3.0000 mL | Freq: Four times a day (QID) | RESPIRATORY_TRACT | Status: DC | PRN

## 2023-06-23 MED ORDER — VITAMIN B-1 100 MG PO TABS
100.0000 mg | ORAL_TABLET | Freq: Every day | ORAL | 0 refills | Status: AC
  Filled 2023-06-23: qty 30, 30d supply, fill #0

## 2023-06-23 MED ORDER — AMOXICILLIN-POT CLAVULANATE 875-125 MG PO TABS
1.0000 | ORAL_TABLET | Freq: Two times a day (BID) | ORAL | 0 refills | Status: AC
  Filled 2023-06-23: qty 10, 5d supply, fill #0

## 2023-06-23 NOTE — Progress Notes (Signed)
 TOC meds delivered in a secure bag to patient in room

## 2023-06-23 NOTE — Progress Notes (Signed)
 PT refused scheduled 1200 nebulizer and CPT therapy.

## 2023-06-23 NOTE — Discharge Summary (Signed)
 Physician Discharge Summary   Patient: Johnny Rodriguez MRN: [id number] DOB: November 02, 1950  Admit date:     06/21/2023  Discharge date: 06/23/23  Discharge Physician: Ozell Blunt   PCP: Pcp, No   Recommendations at discharge:   Follow-up cardiology as outpatient  Discharge Diagnoses: Principal Problem:   Acute and chronic respiratory failure (acute-on-chronic) (HCC) Active Problems:   Need for emotional support   Acute on chronic congestive heart failure (HCC)   HFrEF (heart failure with reduced ejection fraction) (HCC)   COPD exacerbation (HCC)   Counseling and coordination of care   Goals of care, counseling/discussion   Palliative care encounter   Anemia   ETOH abuse  Resolved Problems:   * No resolved hospital problems. *  Hospital Course:  73 year old male, daily smoker with history of alcohol abuse, atrial fibrillation, CAD, hypertension, pulmonary embolism and COPD who returns to the hospital with altered mental status and brought in via EMS. He was placed on Bipap for increased work of breathing and given nebulizer treatments, steroids and antibiotics.    He was recently discharged 06/19/23 as he requested to leave AMA. He has multiple admissions this year for COPD exacerbations and a progressive decline in his respiratory status. Per nursing, he does not wish to be here and does not wish to have bipap and aggressive therapies.    Assessment and Plan:  Acute on Chronic Hypercapnic and Hypoxemic Respiratory Failure due  to pneumonia, complicated by pulmonary edema  - Resolved, back to baseline. - Patient is chronically on home O2 3 L/min -He received antibiotics vancomycin  and ceftriaxone  in the hospital. - Will discharge on Augmentin  1 tablet p.o. twice daily for 5 more days.  Acute on chronic HFrEF - Diuresed 5 L with IV Lasix  given in the hospital - Will start Lasix  40 mg daily from tomorrow morning - Will follow-up with cardiology as outpatient  History of  atrial fibrillation - Continue bisoprolol , apixaban   Acute metabolic encephalopathy with history of alcohol use - Significantly improved, patient refused taking phenobarb today. - Wants to go home - Will discharge home - Does not want to get resources for alcohol rehab - Continue thiamine  100 mg daily  Generalized weakness - PT evaluation obtained, will go home with home health PT.  Refuses skilled nursing facility          Consultants:  Procedures performed:  Disposition: Home Diet recommendation:  Discharge Diet Orders (From admission, onward)     Start     Ordered   06/23/23 0000  Diet - low sodium heart healthy        06/23/23 1445           Regular diet DISCHARGE MEDICATION: Allergies as of 06/23/2023   No Known Allergies      Medication List     STOP taking these medications    meloxicam 15 MG tablet Commonly known as: MOBIC       TAKE these medications    albuterol  (2.5 MG/3ML) 0.083% nebulizer solution Commonly known as: PROVENTIL  Take 3 mLs (2.5 mg total) by nebulization every 4 (four) hours as needed for wheezing or shortness of breath. What changed: when to take this   Ventolin  HFA 108 (90 Base) MCG/ACT inhaler Generic drug: albuterol  Inhale 1-2 puffs into the lungs every 6 (six) hours as needed for wheezing or shortness of breath What changed:  how much to take reasons to take this   amoxicillin -clavulanate 875-125 MG tablet Commonly known as:  AUGMENTIN  Take 1 tablet by mouth 2 (two) times daily for 5 days.   aspirin  EC 81 MG tablet Take 1 tablet (81 mg total) by mouth daily. Swallow whole.   bisoprolol  5 MG tablet Commonly known as: ZEBETA  Take 1 tablet (5 mg total) by mouth daily.   Breo Ellipta  100-25 MCG/ACT Aepb Generic drug: fluticasone  furoate-vilanterol Inhale 1 puff into the lungs daily. What changed: how much to take   Eliquis  5 MG Tabs tablet Generic drug: apixaban  Take 1 tablet (5 mg total) by mouth 2 (two)  times daily.   fluconazole  200 MG tablet Commonly known as: DIFLUCAN  Take 1 tablet (200 mg total) by mouth daily for 10 days.   folic acid  1 MG tablet Commonly known as: FOLVITE  Take 1 tablet (1 mg total) by mouth daily.   furosemide  40 MG tablet Commonly known as: Lasix  Take 1 tablet (40 mg total) by mouth daily. Start taking on: Jun 24, 2023   gabapentin  400 MG capsule Commonly known as: NEURONTIN  Take 1 capsule (400 mg total) by mouth daily as needed (for nerve pain). What changed: when to take this   linaclotide  145 MCG Caps capsule Commonly known as: Linzess  Take 1 capsule (145 mcg total) by mouth daily before breakfast.   OXYGEN  Inhale 3 L/min into the lungs continuous.   pantoprazole  40 MG tablet Commonly known as: PROTONIX  Take 1 tablet (40 mg total) by mouth daily as needed (acid reflux). What changed: when to take this   polyethylene glycol 17 g packet Commonly known as: MIRALAX  / GLYCOLAX  Take 17 g by mouth daily. What changed:  when to take this reasons to take this   pravastatin 10 MG tablet Commonly known as: PRAVACHOL Take 10 mg by mouth daily.   predniSONE  20 MG tablet Commonly known as: DELTASONE  Take 2 tablets (40 mg total) by mouth daily with breakfast.   senna 8.6 MG Tabs tablet Commonly known as: SENOKOT Take 1-2 tablets (8.6-17.2 mg total) by mouth daily. What changed:  how much to take when to take this reasons to take this   Spiriva  Respimat 2.5 MCG/ACT Aers Generic drug: Tiotropium Bromide  Monohydrate Inhale 2 puffs into the lungs daily.   thiamine  100 MG tablet Commonly known as: VITAMIN B1 Take 1 tablet (100 mg total) by mouth daily.   traZODone  50 MG tablet Commonly known as: DESYREL  Take 1 tablet (50 mg total) by mouth at bedtime as needed for sleep. What changed: when to take this   VITAMIN D -3 PO Take 1 capsule by mouth daily.        Discharge Exam: Filed Weights   06/21/23 0039 06/21/23 0800 06/23/23 0500   Weight: 88 kg 74.2 kg 74.7 kg   General-appears in no acute distress Heart-S1-S2, regular, no murmur auscultated Lungs-clear to auscultation bilaterally, no wheezing or crackles auscultated Abdomen-soft, nontender, no organomegaly Extremities-no edema in the lower extremities Neuro-alert, oriented x3, no focal deficit noted  Condition at discharge: good  The results of significant diagnostics from this hospitalization (including imaging, microbiology, ancillary and laboratory) are listed below for reference.   Imaging Studies: DG Chest Port 1 View Result Date: 06/23/2023 CLINICAL DATA:  Pneumonia EXAM: PORTABLE CHEST 1 VIEW COMPARISON:  Jun 21, 2023 FINDINGS: Improving left lower lobe retrocardiac infiltrates and atelectasis. No new infiltrates No congestive changes Heart and mediastinum normal IMPRESSION: Improving left lower lobe retrocardiac infiltrates and atelectasis. Electronically Signed   By: Fredrich Jefferson M.D.   On: 06/23/2023 07:46   DG Chest  Port 1 View Result Date: 06/21/2023 CLINICAL DATA:  Initial evaluation for acute dyspnea. EXAM: PORTABLE CHEST 1 VIEW COMPARISON:  Prior radiograph from 06/15/2023 FINDINGS: Transverse heart size within normal limits. Mediastinal silhouette within normal limits. Aortic atherosclerosis. Changes of emphysema noted. Superimposed streaky and bibasilar opacities could reflect atelectasis or infiltrates. Suspected trace right pleural effusion. Left costophrenic angle incompletely visualized. No overt pulmonary edema. No pneumothorax. Few remotely healed left rib fractures noted. Osteopenia. No acute osseous finding. Osteoarthritic changes noted about the shoulders. IMPRESSION: 1. Streaky and bibasilar opacities, which could reflect atelectasis or infiltrates. 2. Suspected trace right pleural effusion. 3. Aortic Atherosclerosis (ICD10-I70.0) and Emphysema (ICD10-J43.9). Electronically Signed   By: Virgia Griffins M.D.   On: 06/21/2023 01:45   DG  CHEST PORT 1 VIEW Result Date: 06/15/2023 CLINICAL DATA:  Intubated. EXAM: PORTABLE CHEST 1 VIEW COMPARISON:  Earlier today. FINDINGS: The endotracheal tube tip is 5.3 cm above the carina, previously 8.7 cm above the carina. Nasogastric tube extending into the stomach with its tip and side hole not included. Normal sized heart. Tortuous and partially calcified thoracic aorta. The lungs remain hyperexpanded with progressive prominence of the pulmonary vasculature. No airspace consolidation. Minimal bilateral pleural fluid. Unremarkable bones. IMPRESSION: 1. Endotracheal tube tip 5.3 cm above the carina, previously 8.7 cm above the carina. 2. Progressive pulmonary vascular congestion. 3. Minimal bilateral pleural fluid. 4. COPD. Electronically Signed   By: Catherin Closs M.D.   On: 06/15/2023 14:35   DG Abd Portable 1V Result Date: 06/15/2023 CLINICAL DATA:  NG tube placement EXAM: PORTABLE ABDOMEN - 1 VIEW COMPARISON:  June 10, 2023 FINDINGS: the tip of the nasogastric tube is located within the body of stomach in good position. IMPRESSION: Nasogastric tube in good position. Electronically Signed   By: Fredrich Jefferson M.D.   On: 06/15/2023 14:34   DG Chest Portable 1 View Result Date: 06/15/2023 CLINICAL DATA:  Intubation. EXAM: PORTABLE CHEST 1 VIEW COMPARISON:  Chest radiograph dated 06/15/2023 FINDINGS: Endotracheal tube approximately 8 cm above the carina. Enteric tube extends below diaphragm with tip beyond the inferior margin of the image. There is cardiomegaly with mild vascular congestion. Bibasilar atelectasis. No large pleural effusion. No pneumothorax. Atherosclerotic calcification of the aorta. No acute osseous pathology. IMPRESSION: 1. Endotracheal tube approximately 8 cm above the carina. 2. Cardiomegaly with mild vascular congestion. Electronically Signed   By: Angus Bark M.D.   On: 06/15/2023 13:13   DG Chest Portable 1 View Result Date: 06/15/2023 CLINICAL DATA:  Shortness of breath.   Hypoxia. EXAM: PORTABLE CHEST 1 VIEW COMPARISON:  06/10/2023 FINDINGS: Heart size near the upper limit of normal. Hyperexpanded lungs. Mildly progressive pulmonary vascular congestion and accentuation of the interstitial markings. Interval small left pleural effusion. Mild bilateral glenohumeral degenerative changes and marked left AC joint degenerative changes. IMPRESSION: Mild acute CHF superimposed on COPD. Electronically Signed   By: Catherin Closs M.D.   On: 06/15/2023 11:53   DG Abdomen 1 View Result Date: 06/10/2023 EXAM: 1 VIEW XRAY OF THE ABDOMEN SUPINE 06/10/2023 02:32:00 AM COMPARISON: None available. CLINICAL HISTORY: OG and ET tube. Post intubation, OG tube position FINDINGS: BOWEL: The bowel gas pattern is nonspecific. No bowel obstruction. PERITONEUM AND SOFT TISSUES: No abnormal calcifications. BONES: No acute osseous abnormality. LINES AND TUBES: Enteric tube terminates in the proximal gastric body. IMPRESSION: 1. Enteric tube terminates in the proximal gastric body. Electronically signed by: Zadie Herter MD 06/10/2023 02:37 AM EDT RP Workstation: [id number]   DG Chest Portable  1 View Result Date: 06/10/2023 EXAM: 1 VIEW XRAY OF THE CHEST 06/10/2023 02:31:00 AM COMPARISON: Earlier today. CLINICAL HISTORY: OG and ET tube. Post intubation, ETT position FINDINGS: LUNGS AND PLEURA: Mild bibasilar atelectasis. No consolidation. No pulmonary edema. No pleural effusion. No pneumothorax. HEART AND MEDIASTINUM: No acute abnormality of the cardiac and mediastinal silhouettes. Thoracic aortic atherosclerosis. BONES AND SOFT TISSUES: No acute osseous abnormality. LINES AND TUBES: The endotracheal tube terminates 9.5 cm above the carina, in appropriate position. The enteric tube terminates in the gastric cardia, in appropriate position. IMPRESSION: 1. Endotracheal tube and enteric tube in appropriate positions. 2. Mild bibasilar atelectasis. Electronically signed by: Zadie Herter MD 06/10/2023 02:36  AM EDT RP Workstation: [id number]   DG Chest Port 1 View Result Date: 06/10/2023 CLINICAL DATA:  Dyspnea EXAM: PORTABLE CHEST 1 VIEW COMPARISON:  None Available. FINDINGS: The lungs are symmetrically hyperinflated in keeping with changes of underlying COPD. The lungs are clear. No pneumothorax or pleural effusion. Cardiac size is within normal limits. Pulmonary vascularity is normal. Osseous structures are age appropriate. Multiple healed left rib fractures are noted. IMPRESSION: 1. No active disease. COPD. Electronically Signed   By: Worthy Heads M.D.   On: 06/10/2023 01:44   DG Chest Portable 1 View Result Date: 06/04/2023 CLINICAL DATA:  Shortness of breath. EXAM: PORTABLE CHEST 1 VIEW COMPARISON:  May 18, 2023 FINDINGS: The heart size and mediastinal contours are within normal limits. There is mild to moderate severity calcification of the aortic arch. The lungs are hyperinflated. There is no evidence of acute infiltrate. A very small right pleural effusion is suspected. No pneumothorax is identified. Multilevel degenerative changes seen throughout the thoracic spine. IMPRESSION: Emphysematous lung disease with a very small, stable right pleural effusion. Electronically Signed   By: Virgle Grime M.D.   On: 06/04/2023 00:21    Microbiology: Results for orders placed or performed during the hospital encounter of 06/21/23  Resp panel by RT-PCR (RSV, Flu A&B, Covid) Anterior Nasal Swab     Status: None   Collection Time: 06/21/23 12:59 AM   Specimen: Anterior Nasal Swab  Result Value Ref Range Status   SARS Coronavirus 2 by RT PCR NEGATIVE NEGATIVE Final    Comment: (NOTE) SARS-CoV-2 target nucleic acids are NOT DETECTED.  The SARS-CoV-2 RNA is generally detectable in upper respiratory specimens during the acute phase of infection. The lowest concentration of SARS-CoV-2 viral copies this assay can detect is 138 copies/mL. A negative result does not preclude SARS-Cov-2 infection and  should not be used as the sole basis for treatment or other patient management decisions. A negative result may occur with  improper specimen collection/handling, submission of specimen other than nasopharyngeal swab, presence of viral mutation(s) within the areas targeted by this assay, and inadequate number of viral copies(<138 copies/mL). A negative result must be combined with clinical observations, patient history, and epidemiological information. The expected result is Negative.  Fact Sheet for Patients:  BloggerCourse.com  Fact Sheet for Healthcare Providers:  SeriousBroker.it  This test is no t yet approved or cleared by the [place]  FDA and  has been authorized for detection and/or diagnosis of SARS-CoV-2 by FDA under an Emergency Use Authorization (EUA). This EUA will remain  in effect (meaning this test can be used) for the duration of the COVID-19 declaration under Section 564(b)(1) of the Act, 21 U.S.C.section 360bbb-3(b)(1), unless the authorization is terminated  or revoked sooner.       Influenza A by PCR NEGATIVE NEGATIVE Final  Influenza B by PCR NEGATIVE NEGATIVE Final    Comment: (NOTE) The Xpert Xpress SARS-CoV-2/FLU/RSV plus assay is intended as an aid in the diagnosis of influenza from Nasopharyngeal swab specimens and should not be used as a sole basis for treatment. Nasal washings and aspirates are unacceptable for Xpert Xpress SARS-CoV-2/FLU/RSV testing.  Fact Sheet for Patients: BloggerCourse.com  Fact Sheet for Healthcare Providers: SeriousBroker.it  This test is not yet approved or cleared by the [place]  FDA and has been authorized for detection and/or diagnosis of SARS-CoV-2 by FDA under an Emergency Use Authorization (EUA). This EUA will remain in effect (meaning this test can be used) for the duration of the COVID-19 declaration  under Section 564(b)(1) of the Act, 21 U.S.C. section 360bbb-3(b)(1), unless the authorization is terminated or revoked.     Resp Syncytial Virus by PCR NEGATIVE NEGATIVE Final    Comment: (NOTE) Fact Sheet for Patients: BloggerCourse.com  Fact Sheet for Healthcare Providers: SeriousBroker.it  This test is not yet approved or cleared by the [place]  FDA and has been authorized for detection and/or diagnosis of SARS-CoV-2 by FDA under an Emergency Use Authorization (EUA). This EUA will remain in effect (meaning this test can be used) for the duration of the COVID-19 declaration under Section 564(b)(1) of the Act, 21 U.S.C. section 360bbb-3(b)(1), unless the authorization is terminated or revoked.  Performed at [facility], [address] [place], [place] [address]   Culture, blood (Routine X 2) w Reflex to ID Panel     Status: None (Preliminary result)   Collection Time: 06/21/23  4:43 AM   Specimen: BLOOD RIGHT FOREARM  Result Value Ref Range Status   Specimen Description   Final    BLOOD RIGHT FOREARM Performed at [facility], [address], [place], [place] [address]    Special Requests   Final    BOTTLES DRAWN AEROBIC AND ANAEROBIC Blood Culture adequate volume Performed at [facility], [address] [place], [place] [address]    Culture   Final    NO GROWTH 2 DAYS Performed at [facility], [address] [place], [place] [address]    Report Status PENDING  Incomplete  MRSA Next Gen by PCR, Nasal     Status: None   Collection Time: 06/21/23  8:13 AM  Result Value Ref Range Status   MRSA by PCR Next Gen NOT DETECTED NOT DETECTED Final    Comment: (NOTE) The GeneXpert MRSA Assay (FDA approved for NASAL specimens only), is one component of a comprehensive MRSA colonization surveillance program. It is not intended to diagnose MRSA infection nor to guide or  monitor treatment for MRSA infections. Test performance is not FDA approved in patients less than 21 years old. Performed at [facility], [address], [place], [place] [address]   Respiratory (~20 pathogens) panel by PCR     Status: None   Collection Time: 06/21/23 10:34 AM   Specimen: Nasopharyngeal Swab; Respiratory  Result Value Ref Range Status   Adenovirus NOT DETECTED NOT DETECTED Final   Coronavirus 229E NOT DETECTED NOT DETECTED Final    Comment: (NOTE) The Coronavirus on the Respiratory Panel, DOES NOT test for the novel  Coronavirus (2019 nCoV)    Coronavirus HKU1 NOT DETECTED NOT DETECTED Final   Coronavirus NL63 NOT DETECTED NOT DETECTED Final   Coronavirus OC43 NOT DETECTED NOT DETECTED Final   Metapneumovirus NOT DETECTED NOT DETECTED Final   Rhinovirus /  Enterovirus NOT DETECTED NOT DETECTED Final   Influenza A NOT DETECTED NOT DETECTED Final   Influenza B NOT DETECTED NOT DETECTED Final   Parainfluenza Virus 1 NOT DETECTED NOT DETECTED Final   Parainfluenza Virus 2 NOT DETECTED NOT DETECTED Final   Parainfluenza Virus 3 NOT DETECTED NOT DETECTED Final   Parainfluenza Virus 4 NOT DETECTED NOT DETECTED Final   Respiratory Syncytial Virus NOT DETECTED NOT DETECTED Final   Bordetella pertussis NOT DETECTED NOT DETECTED Final   Bordetella Parapertussis NOT DETECTED NOT DETECTED Final   Chlamydophila pneumoniae NOT DETECTED NOT DETECTED Final   Mycoplasma pneumoniae NOT DETECTED NOT DETECTED Final    Comment: Performed at [facility], [address] [place], [place] [address]    Labs: CBC: Recent Labs  Lab 06/19/23 0406 06/21/23 0059 06/21/23 0108 06/21/23 0443 06/22/23 0317 06/23/23 0404  WBC 13.3* 17.4*  --  16.8* 13.5* 14.2*  NEUTROABS  --  16.1*  --   --  13.0*  --   HGB 11.8* 13.9 16.0 13.2 11.5* 11.5*  HCT 38.1* 46.1 47.0 44.0 38.4* 37.4*  MCV 101.9* 105.3*  --  105.5* 106.4* 105.1*  PLT 192 240  --  254 198 231   Basic  Metabolic Panel: Recent Labs  Lab 06/16/23 1702 06/17/23 0714 06/17/23 0715 06/18/23 0431 06/19/23 0406 06/21/23 0059 06/21/23 0108 06/21/23 0443 06/22/23 0317 06/23/23 0404  NA 139  --    < > 135 138 138 135 137 136  --   K 3.8  --    < > 3.6 4.1 4.3 4.3 4.8 4.3  --   CL 95*  --    < > 95* 94* 87* 88* 87* 88*  --   CO2 29  --    < > 34* 37* 39*  --  38* 39*  --   GLUCOSE 124*  --    < > 192* 168* 155* 144* 157* 201*  --   BUN 27*  --    < > 26* 23 25* 28* 27* 21  --   CREATININE 0.95  --    < > 0.71 0.89 0.79 0.90 0.74 0.68 0.66  CALCIUM  9.0  --    < > 8.2* 8.1* 8.7*  --  8.3* 8.1*  --   MG 2.1 2.2  --  2.0 1.9 2.4  --  2.1  --   --   PHOS 5.3*  --   --  2.6  --   --   --  4.6  --   --    < > = values in this interval not displayed.   Liver Function Tests: Recent Labs  Lab 06/21/23 0059  AST 26  ALT 50*  ALKPHOS 71  BILITOT 1.6*  PROT 6.9  ALBUMIN  3.9   CBG: Recent Labs  Lab 06/22/23 0500 06/22/23 0748 06/22/23 1203 06/22/23 2208 06/23/23 1220  GLUCAP 298* 193* 203* 166* 109*    Discharge time spent: greater than 30 minutes.  Signed: Ozell Blunt, MD [facility] 06/23/2023

## 2023-06-23 NOTE — Progress Notes (Signed)
 Physical Therapy Treatment Patient Details Name: Johnny Rodriguez MRN: [id number] DOB: 12-24-1950 Today's Date: 06/23/2023   History of Present Illness Patient is a 73 year old male who presented with AMS and SOB. Patient was admitted with acute on chronic hypercapnic and hypoxemic respiratory failure, pneumonia, and sepsis. Patient noted to have recent d/c from hosptial with similar concern. 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 Pt remains impulsive and demonstrates poor awareness of safety and health conditions. Pt required cues for safety with transfers and gait, able to power up with bil Ue's and amb ~30' with RW, CGA. Pt fatigues quickly and required 3 standing rest breaks during gait. HR elevated to 152 bpm max and SpO2 dropped to 80's and recovered with rest and 3L/min. No overt LOB noted during gait and pt maintained close proximity to RW. Discussed concerns regarding HR and SpO2 and pt resistant to education. Pt adamant on going home despite concerns in functional endurance and limited to no assist available at home. Will continue to progress as able in acute setting. Recommend HHPT follow up.    If plan is discharge home, recommend the following: Assistance with cooking/housework;Help with stairs or ramp for entrance;Assist for transportation;A lot of help with walking and/or transfers;A lot of help with bathing/dressing/bathroom;Supervision due to cognitive status   Can travel by private vehicle     No  Equipment Recommendations  None recommended by PT    Recommendations for Other Services       Precautions / Restrictions Precautions Precautions: Fall;Other (comment) Recall of Precautions/Restrictions: Intact Precaution/Restrictions Comments: watch sats and  HR Restrictions Weight Bearing Restrictions Per Provider Order: No      Mobility  Bed Mobility               General bed mobility comments: pt seated EOB at start and end of session    Transfers Overall transfer level: Needs assistance Equipment used: Rolling walker (2 wheels) Transfers: Sit to/from Stand Sit to Stand: Contact guard assist           General transfer comment: CGA for safety, pt able to power up with bil UE use and stable in standing.    Ambulation/Gait Ambulation/Gait assistance: Contact guard assist Gait Distance (Feet): 30 Feet Assistive device: Rolling walker (2 wheels) Gait Pattern/deviations: Step-through pattern, Decreased stride length Gait velocity: decr     General Gait Details: CGA for safety throughout. cues to maintain position to RW, pt did. Pt desat on RA and recovered with 3L/min to low 90's. pt SOB and HR elevated to 152 bpm max with ambulation of 30'.   Stairs             Wheelchair Mobility     Tilt Bed    Modified Rankin (Stroke Patients Only)       Balance Overall balance assessment: Needs assistance Sitting-balance support: Feet supported, Bilateral upper extremity supported Sitting balance-Leahy Scale: Fair     Standing balance support: Reliant on assistive device for balance, Bilateral upper extremity supported, During functional activity Standing balance-Leahy Scale: Poor                              Communication Communication Communication: No apparent difficulties  Cognition Arousal: Alert Behavior During Therapy: Agitated, Impulsive   PT - Cognitive impairments: Awareness, Safety/Judgement  PT - Cognition Comments: very poor safety awareness, impatient and not receptive to education Following commands: Intact      Cueing Cueing Techniques: Verbal cues, Visual cues, Tactile cues  Exercises      General Comments        Pertinent Vitals/Pain Pain Assessment Pain Assessment: No/denies pain    Home Living                           Prior Function            PT Goals (current goals can now be found in the care plan section) Acute Rehab PT Goals Patient Stated Goal: return home PT Goal Formulation: With patient Time For Goal Achievement: 07/06/23 Potential to Achieve Goals: Fair Progress towards PT goals: Progressing toward goals    Frequency    Min 2X/week      PT Plan      Co-evaluation              AM-PAC PT "6 Clicks" Mobility   Outcome Measure  Help needed turning from your back to your side while in a flat bed without using bedrails?: A Little Help needed moving from lying on your back to sitting on the side of a flat bed without using bedrails?: A Little Help needed moving to and from a bed to a chair (including a wheelchair)?: A Little Help needed standing up from a chair using your arms (e.g., wheelchair or bedside chair)?: A Little Help needed to walk in hospital room?: A Little Help needed climbing 3-5 steps with a railing? : A Lot 6 Click Score: 17    End of Session Equipment Utilized During Treatment: Gait belt;Oxygen  Activity Tolerance: Patient limited by fatigue Patient left: in chair;with call bell/phone within reach;with chair alarm set Nurse Communication: Mobility status PT Visit Diagnosis: Muscle weakness (generalized) (M62.81);Other abnormalities of gait and mobility (R26.89);Unsteadiness on feet (R26.81);Difficulty in walking, not elsewhere classified (R26.2)     Time: 1610-9604 PT Time Calculation (min) (ACUTE ONLY): 18 min  Charges:    $Gait Training: 8-22 mins PT General Charges $$ ACUTE PT VISIT: 1 Visit                     Tish Forge, DPT Acute Rehabilitation Services Office [phone]  06/23/23 3:39 PM

## 2023-06-23 NOTE — TOC Transition Note (Signed)
 Transition of Care [facility]) - Discharge Note   Patient Details  Name: Johnny Rodriguez MRN: [id number] Date of Birth: 05-Apr-1950  [facility] CM/SW Contact:  Gertha Ku, LCSW Phone Number: 06/23/2023, 3:20 PM   Clinical Narrative:    CSW met with the pt to discuss recommendations. The pt has declined SNF placement but has agreed to [facility] services. The pt has no preference for a specific HH agency. [facility] has accepted the patient for Home Health PT. The pt is on home oxygen  and will need a portable tank for discharge. [employer] will deliver the portable tank to the pt's room prior to d/c.  CSW received a consult to discuss substance use resources with the pt, but the pt has declined these resources. The pt is requesting transportation assistance and has signed a Therapist, nutritional. A taxi voucher has been given to the pt's RN for when the pt is ready for d/c. No further TOC needs , TOC sign off.    Final next level of care: (P) Home w Home Health Services Barriers to Discharge: (P) Barriers Resolved   Patient Goals and CMS Choice Patient states their goals for this hospitalization and ongoing recovery are:: retrun home          Discharge Placement                       Discharge Plan and Services Additional resources added to the After Visit Summary for                                       Social Drivers of Health (SDOH) Interventions SDOH Screenings   Food Insecurity: No Food Insecurity (06/21/2023)  Housing: Low Risk  (06/21/2023)  Transportation Needs: No Transportation Needs (06/21/2023)  Recent Concern: Transportation Needs - Unmet Transportation Needs (05/18/2023)  Utilities: Not At Risk (06/21/2023)  Alcohol Screen: Low Risk  (09/11/2020)  Recent Concern: Alcohol Screen - Medium Risk (08/07/2020)  Depression (PHQ2-9): Low Risk  (11/21/2022)  Financial Resource Strain: Low Risk  (11/21/2022)  Physical Activity: Inactive (06/21/2023)   Social Connections: Moderately Isolated (06/21/2023)  Stress: No Stress Concern Present (11/21/2022)  Tobacco Use: High Risk (06/21/2023)  Health Literacy: Adequate Health Literacy (11/21/2022)     Readmission Risk Interventions    06/10/2023   12:46 PM  Readmission Risk Prevention Plan  Transportation Screening Complete  PCP or Specialist Appt within 3-5 Days Complete  HRI or Home Care Consult Complete  Social Work Consult for Recovery Care Planning/Counseling Complete  Palliative Care Screening Not Applicable  Medication Review Oceanographer) Complete

## 2023-06-26 LAB — CULTURE, BLOOD (ROUTINE X 2)
Culture: NO GROWTH
Special Requests: ADEQUATE
Special Requests: ADEQUATE

## 2023-07-08 NOTE — Telephone Encounter (Signed)
Nothing further to add.

## 2023-07-09 ENCOUNTER — Ambulatory Visit: Attending: Cardiology | Admitting: Cardiology

## 2023-07-09 NOTE — Progress Notes (Deleted)
 Cardiology Office Note:   Date:  07/09/2023  ID:  Johnny Rodriguez, DOB 07/01/1950, MRN [id number] PCP: Johnny Rodriguez, No  [facility] Providers Cardiologist:  None Electrophysiologist:  Manya Sells, MD { Click to update primary MD,subspecialty MD or APP then REFRESH:1}   History of Present Illness:   Johnny Rodriguez is a 73 y.o. male ***  Discussed the use of AI scribe software for clinical note transcription with the patient, who gave verbal consent to proceed.  History of Present Illness      Today patient denies chest pain, shortness of breath, lower extremity edema, fatigue, palpitations, melena, hematuria, hemoptysis, diaphoresis, weakness, presyncope, syncope, orthopnea, and PND.   Studies Reviewed:    EKG:        May 29, 2023 TTE  IMPRESSIONS     1. Left ventricular ejection fraction, by estimation, is 40 to 45%. The  left ventricle has mildly decreased function. The left ventricle  demonstrates global hypokinesis. Left ventricular diastolic parameters are  indeterminate.   2. Right ventricular systolic function is normal. The right ventricular  size is normal. There is mildly elevated pulmonary artery systolic  pressure.   3. Left atrial size was mild to moderately dilated.   4. Right atrial size was moderately dilated.   5. The mitral valve is normal in structure. Trivial mitral valve  regurgitation. No evidence of mitral stenosis.   6. The aortic valve is tricuspid. Aortic valve regurgitation is not  visualized. No aortic stenosis is present.   7. The inferior vena cava is normal in size with greater than 50%  respiratory variability, suggesting right atrial pressure of 3 mmHg.   FINDINGS   Left Ventricle: Left ventricular ejection fraction, by estimation, is 40  to 45%. The left ventricle has mildly decreased function. The left  ventricle demonstrates global hypokinesis. The left ventricular internal  cavity size was normal in size. There is   no  left ventricular hypertrophy. Left ventricular diastolic function  could not be evaluated due to atrial fibrillation. Left ventricular  diastolic parameters are indeterminate.   Right Ventricle: The right ventricular size is normal. No increase in  right ventricular wall thickness. Right ventricular systolic function is  normal. There is mildly elevated pulmonary artery systolic pressure. The  tricuspid regurgitant velocity is 3.23   m/s, and with an assumed right atrial pressure of 3 mmHg, the estimated  right ventricular systolic pressure is 44.7 mmHg.   Left Atrium: Left atrial size was mild to moderately dilated.   Right Atrium: Right atrial size was moderately dilated.   Pericardium: There is no evidence of pericardial effusion.   Mitral Valve: The mitral valve is normal in structure. Trivial mitral  valve regurgitation. No evidence of mitral valve stenosis.   Tricuspid Valve: The tricuspid valve is normal in structure. Tricuspid  valve regurgitation is mild . No evidence of tricuspid stenosis.   Aortic Valve: The aortic valve is tricuspid. Aortic valve regurgitation is  not visualized. No aortic stenosis is present.   Pulmonic Valve: The pulmonic valve was normal in structure. Pulmonic valve  regurgitation is not visualized. No evidence of pulmonic stenosis.   Aorta: The aortic root is normal in size and structure.   Venous: The inferior vena cava is normal in size with greater than 50%  respiratory variability, suggesting right atrial pressure of 3 mmHg.   IAS/Shunts: No atrial level shunt detected by color flow Doppler.   Risk Assessment/Calculations:   {Does this patient have ATRIAL FIBRILLATION?:[phone]} No  BP recorded.  {Refresh Note OR Click here to enter BP  :1}***        Physical Exam:   VS:  There were no vitals taken for this visit.   Wt Readings from Last 3 Encounters:  06/23/23 164 lb 10.9 oz (74.7 kg)  06/19/23 194 lb 7.1 oz (88.2 kg)  06/11/23 174  lb 6.1 oz (79.1 kg)     Physical Exam  Physical Exam    ASSESSMENT AND PLAN:     Assessment and Plan Assessment & Plan          {Are you ordering a CV Procedure (e.g. stress test, cath, DCCV, TEE, etc)?   Press F2        :[id number]}   Signed, Leala Prince, PA-C

## 2023-07-16 ENCOUNTER — Encounter: Payer: Self-pay | Admitting: Nurse Practitioner

## 2023-07-16 ENCOUNTER — Ambulatory Visit: Admitting: Nurse Practitioner

## 2023-07-22 ENCOUNTER — Telehealth: Payer: Self-pay | Admitting: Nurse Practitioner

## 2023-07-22 NOTE — Telephone Encounter (Signed)
 Attempted to call patient x2 due to returned mail of appointment reminder that was sent for his appt that he NS on 07/16/2023 with Girard Lam. The letter was return to sender due to deceased. I attempted to run eligibility through medicare, research an obituary, and called patient. No further information found regarding patient being deceased.   I did call the patient's emergency contact as well, but only left a [place] [employer] VM to verify a good contact for the patient.   No further f/u needed at this time, but unable to mark patients chart without further identifiers verifying this information confirming patients status.

## 2023-07-25 NOTE — Discharge Summary (Signed)
 Physician Discharge Summary   Patient: Johnny Rodriguez MRN: [id number] DOB: 11/22/1950  Admit date:     06/10/2023  Discharge date: 06/14/2023  Discharge Physician: True Fuss   PCP: Pcp, No   Recommendations at discharge:   Please take all prescribed medications exactly as instructed.  These are all listed in detail in your after visit summary including the remaining course of your antibiotic therapy, your prednisone  taper and your increased regimen of metoprolol . Please continue to use your supplemental oxygen  at home with 3 L of oxygen  via nasal cannula at all times. A referral has been made for you to receive home health physical therapy as well as home nursing.  You should be contacted in the next 1 to 2 days to establish care. Please consume a low-sodium diet Please increase your physical activity as tolerated.  Only ambulate using an assistive device as a wheelchair or a walker or with assistance. Please maintain all outpatient follow-up appointments including follow-up with your primary care provider as well as pulmonology as detailed in your after visit summary. Please return to the emergency department if you develop worsening shortness of breath, fevers in excess of 100.4 F, weakness or inability to tolerate oral intake.  Discharge Diagnoses: Principal Problem:   Acute hypoxic on chronic hypercapnic respiratory failure (HCC) Active Problems:   Acute on chronic respiratory failure with hypoxia and hypercapnia (HCC)   COPD with acute exacerbation (HCC)   Chronic combined systolic and diastolic congestive heart failure (HCC)   Essential hypertension   Nicotine  dependence, cigarettes, uncomplicated   Paroxysmal atrial fibrillation with rapid ventricular response (HCC)   Protein-calorie malnutrition, moderate (HCC)   Generalized weakness   Hyperkalemia  Resolved Problems:   * No resolved hospital problems. *   Hospital Course: 73 y.o. male with medical history  significant of COPD, chronic hypoxic respiratory failure on 3 L oxygen  at baseline, paroxysmal atrial fibrillation and pulmonary embolism (03/2022) on Eliquis , systolic and diastolic congestive heart failure with EF 35 to 40%, essential hypertension and chronic pain syndrome presented to emergency department on 4/30 complaining of shortness of breath.    Patient was found respiratory distress thought to be secondary to COPD exacerbation.  Patient was briefly placed on BiPAP but failed quickly and was intubated in ED. patient was initially admitted to the [facility] service.    Patient was managed with steroids, bronchodilator therapy, Pulmicort  and Brovana .  Ceftriaxone  was added due to gram-negative rods found in sputum culture.  Patient was extubated successfully morning of 5/1.  Patient was accepted to the hospitalist service beginning 5/2.  Patient continued to clinically improve and was weaned to his baseline oxygen  regimen of 3LPM via Nasal canula.   As patient clinically improved, a physical therapy assessment was performed and it was determined that the patient would benefit form skilled PT.    The patient was discharged home in improved and stable condition on 5/4 with home health physical therapy.  Patient was sent home on his remaining course of antibiotic therapy and systemic steroids.        Consultants: PCCM Procedures performed: Endotrachial intubation 06/10/2023 Disposition: Home health Diet recommendation:  Discharge Diet Orders (From admission, onward)     Start     Ordered   06/14/23 0000  Diet - low sodium heart healthy        06/14/23 1320           Cardiac diet  DISCHARGE MEDICATION: Allergies as of 06/14/2023   No  Known Allergies      Medication List     STOP taking these medications    metoprolol  succinate 25 MG 24 hr tablet Commonly known as: TOPROL -XL   predniSONE  20 MG tablet Commonly known as: DELTASONE        TAKE these medications    albuterol   (2.5 MG/3ML) 0.083% nebulizer solution Commonly known as: PROVENTIL  Take 3 mLs (2.5 mg total) by nebulization every 4 (four) hours as needed for wheezing or shortness of breath. What changed: when to take this   Ventolin  HFA 108 (90 Base) MCG/ACT inhaler Generic drug: albuterol  Inhale 1-2 puffs into the lungs every 6 (six) hours as needed for wheezing or shortness of breath What changed:  how much to take reasons to take this   aspirin  EC 81 MG tablet Take 1 tablet (81 mg total) by mouth daily. Swallow whole.   Breo Ellipta  100-25 MCG/ACT Aepb Generic drug: fluticasone  furoate-vilanterol Inhale 1 puff into the lungs daily. What changed: how much to take   Eliquis  5 MG Tabs tablet Generic drug: apixaban  Take 1 tablet (5 mg total) by mouth 2 (two) times daily.   folic acid  1 MG tablet Commonly known as: FOLVITE  Take 1 tablet (1 mg total) by mouth daily.   gabapentin  400 MG capsule Commonly known as: NEURONTIN  Take 1 capsule (400 mg total) by mouth daily as needed (for nerve pain). What changed: when to take this   linaclotide  145 MCG Caps capsule Commonly known as: Linzess  Take 1 capsule (145 mcg total) by mouth daily before breakfast.   OXYGEN  Inhale 3 L/min into the lungs continuous.   pantoprazole  40 MG tablet Commonly known as: PROTONIX  Take 1 tablet (40 mg total) by mouth daily as needed (acid reflux). What changed: when to take this   polyethylene glycol 17 g packet Commonly known as: MIRALAX  / GLYCOLAX  Take 17 g by mouth daily. What changed:  when to take this reasons to take this   pravastatin  10 MG tablet Commonly known as: PRAVACHOL  Take 10 mg by mouth daily.   senna 8.6 MG Tabs tablet Commonly known as: SENOKOT Take 1-2 tablets (8.6-17.2 mg total) by mouth daily. What changed:  how much to take when to take this reasons to take this   Spiriva  Respimat 2.5 MCG/ACT Aers Generic drug: Tiotropium Bromide  Monohydrate Inhale 2 puffs into the lungs  daily.   traZODone  50 MG tablet Commonly known as: DESYREL  Take 1 tablet (50 mg total) by mouth at bedtime as needed for sleep. What changed: when to take this   VITAMIN D -3 PO Take 1 capsule by mouth daily.        Follow-up Information     Roetta Clarke, NP. Go on 08/19/2023.   Specialty: Nurse Practitioner Why: You have an appointment scheduled at [facility] on June 5th at 11:30 please arrive 15 minutes early Contact information: [address] [place] [address] [phone]         Senaida Dama, NP. Schedule an appointment as soon as possible for a visit in 1 week(s).   Specialty: Nurse Practitioner Contact information: [address] [place] [address] [phone]                 Discharge Exam: Cleavon Curls Weights   06/10/23 0115 06/11/23 0500  Weight: 77.4 kg 79.1 kg    Constitutional: Awake alert and oriented x3, no associated distress.   Respiratory: clear to auscultation bilaterally, no wheezing, no crackles. Normal  respiratory effort. No accessory muscle use.  Cardiovascular: Regular rate and rhythm, no murmurs / rubs / gallops. No extremity edema. 2+ pedal pulses. No carotid bruits.  Abdomen: Abdomen is soft and nontender.  No evidence of intra-abdominal masses.  Positive bowel sounds noted in all quadrants.   Musculoskeletal: No joint deformity upper and lower extremities. Good ROM, no contractures. Normal muscle tone.     Condition at discharge: fair  The results of significant diagnostics from this hospitalization (including imaging, microbiology, ancillary and laboratory) are listed below for reference.   Imaging Studies: No results found.  Microbiology: Results for orders placed or performed during the hospital encounter of 06/10/23  Culture, blood (Routine X 2) w Reflex to ID Panel     Status: None   Collection Time: 06/10/23  1:26 AM   Specimen: BLOOD  Result Value Ref Range Status    Specimen Description   Final    BLOOD BLOOD RIGHT WRIST Performed at [facility], [address], [place], [place] [address]    Special Requests   Final    BOTTLES DRAWN AEROBIC ONLY Blood Culture results may not be optimal due to an inadequate volume of blood received in culture bottles Performed at [facility], [address], [place], [place] [address]    Culture   Final    NO GROWTH 5 DAYS Performed at [facility], [address], [place], [place] [address]    Report Status 06/15/2023 FINAL  Final  Culture, blood (Routine X 2) w Reflex to ID Panel     Status: None   Collection Time: 06/10/23  1:27 AM   Specimen: BLOOD  Result Value Ref Range Status   Specimen Description   Final    BLOOD BLOOD RIGHT FOREARM Performed at [facility], [address] [place], [place] [address]    Special Requests   Final    BOTTLES DRAWN AEROBIC AND ANAEROBIC Blood Culture results may not be optimal due to an inadequate volume of blood received in culture bottles Performed at [facility], [address], [place], [place] [address]    Culture   Final    NO GROWTH 5 DAYS Performed at [facility], [address], [place], [place] [address]    Report Status 06/15/2023 FINAL  Final  Resp panel by RT-PCR (RSV, Flu A&B, Covid) Anterior Nasal Swab     Status: None   Collection Time: 06/10/23  1:28 AM   Specimen: Anterior Nasal Swab  Result Value Ref Range Status   SARS Coronavirus 2 by RT PCR NEGATIVE NEGATIVE Final    Comment: (NOTE) SARS-CoV-2 target nucleic acids are NOT DETECTED.  The SARS-CoV-2 RNA is generally detectable in upper respiratory specimens during the acute phase of infection. The lowest concentration of SARS-CoV-2 viral copies this assay can detect is 138 copies/mL. A negative result does not preclude SARS-Cov-2 infection and should not be used as the sole basis for treatment or other  patient management decisions. A negative result may occur with  improper specimen collection/handling, submission of specimen other than nasopharyngeal swab, presence of viral mutation(s) within the areas targeted by this assay, and inadequate number of viral copies(<138 copies/mL). A negative result must be combined with clinical observations, patient history, and epidemiological information. The expected result is Negative.  Fact Sheet for Patients:  BloggerCourse.com  Fact Sheet for Healthcare Providers:  SeriousBroker.it  This test is no t yet approved or cleared  by the [place]  FDA and  has been authorized for detection and/or diagnosis of SARS-CoV-2 by FDA under an Emergency Use Authorization (EUA). This EUA will remain  in effect (meaning this test can be used) for the duration of the COVID-19 declaration under Section 564(b)(1) of the Act, 21 U.S.C.section 360bbb-3(b)(1), unless the authorization is terminated  or revoked sooner.       Influenza A by PCR NEGATIVE NEGATIVE Final   Influenza B by PCR NEGATIVE NEGATIVE Final    Comment: (NOTE) The Xpert Xpress SARS-CoV-2/FLU/RSV plus assay is intended as an aid in the diagnosis of influenza from Nasopharyngeal swab specimens and should not be used as a sole basis for treatment. Nasal washings and aspirates are unacceptable for Xpert Xpress SARS-CoV-2/FLU/RSV testing.  Fact Sheet for Patients: BloggerCourse.com  Fact Sheet for Healthcare Providers: SeriousBroker.it  This test is not yet approved or cleared by the [place]  FDA and has been authorized for detection and/or diagnosis of SARS-CoV-2 by FDA under an Emergency Use Authorization (EUA). This EUA will remain in effect (meaning this test can be used) for the duration of the COVID-19 declaration under Section 564(b)(1) of the Act, 21 U.S.C. section  360bbb-3(b)(1), unless the authorization is terminated or revoked.     Resp Syncytial Virus by PCR NEGATIVE NEGATIVE Final    Comment: (NOTE) Fact Sheet for Patients: BloggerCourse.com  Fact Sheet for Healthcare Providers: SeriousBroker.it  This test is not yet approved or cleared by the [place]  FDA and has been authorized for detection and/or diagnosis of SARS-CoV-2 by FDA under an Emergency Use Authorization (EUA). This EUA will remain in effect (meaning this test can be used) for the duration of the COVID-19 declaration under Section 564(b)(1) of the Act, 21 U.S.C. section 360bbb-3(b)(1), unless the authorization is terminated or revoked.  Performed at [facility], [address], [place], [place] [address]   MRSA Next Gen by PCR, Nasal     Status: None   Collection Time: 06/10/23 10:02 AM   Specimen: Nasal Mucosa; Nasal Swab  Result Value Ref Range Status   MRSA by PCR Next Gen NOT DETECTED NOT DETECTED Final    Comment: (NOTE) The GeneXpert MRSA Assay (FDA approved for NASAL specimens only), is one component of a comprehensive MRSA colonization surveillance program. It is not intended to diagnose MRSA infection nor to guide or monitor treatment for MRSA infections. Test performance is not FDA approved in patients less than 15 years old. Performed at [facility], [address], [place], [place] [address]   Culture, Respiratory w Gram Stain     Status: None   Collection Time: 06/10/23  4:11 PM   Specimen: Tracheal Aspirate  Result Value Ref Range Status   Specimen Description TRACHEAL ASPIRATE  Final   Special Requests NONE  Final   Gram Stain   Final    ABUNDANT WBC PRESENT, PREDOMINANTLY PMN ABUNDANT GRAM NEGATIVE RODS Performed at [facility], [address], [place], [place] [address]    Culture ABUNDANT PSEUDOMONAS AERUGINOSA  Final   Report Status 06/12/2023  FINAL  Final   Organism ID, Bacteria PSEUDOMONAS AERUGINOSA  Final      Susceptibility   Pseudomonas aeruginosa - MIC*    CEFTAZIDIME 4 SENSITIVE Sensitive     CIPROFLOXACIN 0.5 SENSITIVE Sensitive     GENTAMICIN  4 SENSITIVE Sensitive     IMIPENEM 2 SENSITIVE Sensitive     PIP/TAZO 8 SENSITIVE Sensitive ug/mL    CEFEPIME   2 SENSITIVE Sensitive     * ABUNDANT PSEUDOMONAS AERUGINOSA    Labs: CBC: No results for input(s): WBC, NEUTROABS, HGB, HCT, MCV, PLT in the last 168 hours. Basic Metabolic Panel: No results for input(s): NA, K, CL, CO2, GLUCOSE, BUN, CREATININE, CALCIUM , MG, PHOS in the last 168 hours. Liver Function Tests: No results for input(s): AST, ALT, ALKPHOS, BILITOT, PROT, ALBUMIN  in the last 168 hours. CBG: No results for input(s): GLUCAP in the last 168 hours.  Discharge time spent: greater than 30 minutes.  Signed: True Fuss, MD [facility] 07/25/2023

## 2023-08-19 ENCOUNTER — Ambulatory Visit: Admitting: Pulmonary Disease
